# Patient Record
Sex: Male | Born: 1940
Health system: Southern US, Community
[De-identification: ages and names within clinical notes are randomized; demographics above are authoritative.]

## PROBLEM LIST (undated history)

## (undated) DIAGNOSIS — I779 Disorder of arteries and arterioles, unspecified: Secondary | ICD-10-CM

## (undated) DIAGNOSIS — I2510 Atherosclerotic heart disease of native coronary artery without angina pectoris: Secondary | ICD-10-CM

## (undated) DIAGNOSIS — I714 Abdominal aortic aneurysm, without rupture, unspecified: Secondary | ICD-10-CM

## (undated) DIAGNOSIS — Z789 Other specified health status: Secondary | ICD-10-CM

## (undated) DIAGNOSIS — J189 Pneumonia, unspecified organism: Secondary | ICD-10-CM

## (undated) DIAGNOSIS — Z950 Presence of cardiac pacemaker: Secondary | ICD-10-CM

## (undated) DIAGNOSIS — C801 Malignant (primary) neoplasm, unspecified: Secondary | ICD-10-CM

## (undated) DIAGNOSIS — K219 Gastro-esophageal reflux disease without esophagitis: Secondary | ICD-10-CM

## (undated) DIAGNOSIS — M1990 Unspecified osteoarthritis, unspecified site: Secondary | ICD-10-CM

## (undated) DIAGNOSIS — I739 Peripheral vascular disease, unspecified: Secondary | ICD-10-CM

## (undated) DIAGNOSIS — E785 Hyperlipidemia, unspecified: Secondary | ICD-10-CM

## (undated) DIAGNOSIS — I10 Essential (primary) hypertension: Secondary | ICD-10-CM

## (undated) DIAGNOSIS — N183 Chronic kidney disease, stage 3 unspecified: Secondary | ICD-10-CM

## (undated) DIAGNOSIS — E119 Type 2 diabetes mellitus without complications: Secondary | ICD-10-CM

## (undated) DIAGNOSIS — R001 Bradycardia, unspecified: Secondary | ICD-10-CM

## (undated) DIAGNOSIS — I480 Paroxysmal atrial fibrillation: Secondary | ICD-10-CM

## (undated) HISTORY — PX: KNEE SURGERY: SHX244

## (undated) HISTORY — PX: JOINT REPLACEMENT: SHX530

## (undated) HISTORY — PX: URINARY SURGERY: SHX2626

## (undated) HISTORY — PX: FINGER SURGERY: SHX640

## (undated) HISTORY — PX: PROSTATE SURGERY: SHX751

## (undated) HISTORY — DX: Bradycardia, unspecified: R00.1

## (undated) HISTORY — PX: ANKLE SURGERY: SHX546

## (undated) HISTORY — DX: Essential (primary) hypertension: I10

## (undated) HISTORY — DX: Hyperlipidemia, unspecified: E78.5

## (undated) HISTORY — PX: BACK SURGERY: SHX140

## (undated) HISTORY — DX: Chronic kidney disease, stage 3 unspecified: N18.30

## (undated) HISTORY — DX: Type 2 diabetes mellitus without complications: E11.9

## (undated) HISTORY — DX: Gastro-esophageal reflux disease without esophagitis: K21.9

## (undated) HISTORY — DX: Other specified health status: Z78.9

## (undated) HISTORY — PX: EXPLORATION POST OPERATIVE OPEN HEART: SHX5061

## (undated) HISTORY — PX: HERNIA REPAIR: SHX51

## (undated) HISTORY — DX: Peripheral vascular disease, unspecified: I73.9

## (undated) HISTORY — DX: Malignant (primary) neoplasm, unspecified: C80.1

## (undated) HISTORY — DX: Disorder of arteries and arterioles, unspecified: I77.9

## (undated) HISTORY — DX: Chronic kidney disease, stage 3 (moderate): N18.3

## (undated) HISTORY — PX: COLONOSCOPY: SHX174

## (undated) HISTORY — PX: TONSILLECTOMY: SUR1361

---

## 2002-11-30 HISTORY — PX: CORONARY ARTERY BYPASS GRAFT: SHX141

## 2012-09-20 DIAGNOSIS — M1710 Unilateral primary osteoarthritis, unspecified knee: Secondary | ICD-10-CM | POA: Insufficient documentation

## 2012-10-18 DIAGNOSIS — R1032 Left lower quadrant pain: Secondary | ICD-10-CM | POA: Insufficient documentation

## 2012-10-18 DIAGNOSIS — N201 Calculus of ureter: Secondary | ICD-10-CM | POA: Insufficient documentation

## 2012-10-18 DIAGNOSIS — N320 Bladder-neck obstruction: Secondary | ICD-10-CM | POA: Insufficient documentation

## 2012-10-18 DIAGNOSIS — N430 Encysted hydrocele: Secondary | ICD-10-CM | POA: Insufficient documentation

## 2012-10-18 DIAGNOSIS — K4090 Unilateral inguinal hernia, without obstruction or gangrene, not specified as recurrent: Secondary | ICD-10-CM | POA: Insufficient documentation

## 2013-08-24 ENCOUNTER — Ambulatory Visit (INDEPENDENT_AMBULATORY_CARE_PROVIDER_SITE_OTHER): Payer: Medicare Other | Admitting: General Surgery

## 2013-08-24 ENCOUNTER — Encounter (INDEPENDENT_AMBULATORY_CARE_PROVIDER_SITE_OTHER): Payer: Self-pay | Admitting: General Surgery

## 2013-08-24 VITALS — BP 128/76 | HR 60 | Temp 98.0°F | Resp 14 | Ht 74.0 in | Wt 218.2 lb

## 2013-08-24 DIAGNOSIS — L02419 Cutaneous abscess of limb, unspecified: Secondary | ICD-10-CM

## 2013-08-24 MED ORDER — DOXYCYCLINE HYCLATE 100 MG PO TABS: 100.0000 mg | ORAL_TABLET | Freq: Two times a day (BID) | ORAL | Status: DC

## 2013-08-24 MED ORDER — SULFAMETHOXAZOLE-TRIMETHOPRIM 800-160 MG PO TABS: 2.0000 | ORAL_TABLET | Freq: Two times a day (BID) | ORAL | Status: DC

## 2013-08-24 NOTE — Progress Notes (Signed)
Subjective:     Patient ID: Thomas Marshall, male   DOB: [birth year], 72 y.o.   MRN: [id number]  HPI This is a 72 year old male was multiple medical problems and has a two-day history of a left thigh mass that has become tender. There is associated with some redness. He denies any fevers. He denies any drainage. He denies any prior history of a mass in this region. Glucoses have been under fairly good control without any real change. He was referred for evaluation.  Review of Systems  Constitutional: Negative for fever, chills and unexpected weight change.  HENT: Negative for hearing loss, congestion, sore throat, trouble swallowing and voice change.   Eyes: Negative for visual disturbance.  Respiratory: Negative for cough and wheezing.   Cardiovascular: Negative for chest pain, palpitations and leg swelling.  Gastrointestinal: Negative for nausea, vomiting, abdominal pain, diarrhea, constipation, blood in stool, abdominal distention, anal bleeding and rectal pain.  Genitourinary: Negative for hematuria and difficulty urinating.  Musculoskeletal: Negative for arthralgias.  Skin: Negative for rash and wound.  Neurological: Negative for seizures, syncope, weakness and headaches.  Hematological: Negative for adenopathy. Bruises/bleeds easily.  Psychiatric/Behavioral: Negative for confusion.       Objective:   Physical Exam 4x5 cm area of induration with some surrounding erythema near his vein harvest sites, tender, fluctuant, c/w abscess    Assessment:     Left thigh abscess     Plan:     I discussed the options with him. He is on Coumadin and aspirin which complicates this. I think due to his diabetes in its appearance this does need to be incised and drained. I think it is better just to do this in the office as opposed to reversing his Coumadin. I think her bleeding risk is much lower than the risk of anything else. After we discussed all of this I cleansed the area. I then anesthetized  with lidocaine. I then made an incision into the indurated area with return of some bloody fluid, purulence, and some necrotic tissue. This was widely opened. I then packed this tightly with iodoform gauze. A dressing was then placed. I will plan on having him come back tomorrow for a dressing change with my nurse and follow up with me on Monday.

## 2013-08-25 ENCOUNTER — Ambulatory Visit (INDEPENDENT_AMBULATORY_CARE_PROVIDER_SITE_OTHER): Payer: Medicare Other | Admitting: General Surgery

## 2013-08-25 ENCOUNTER — Encounter (INDEPENDENT_AMBULATORY_CARE_PROVIDER_SITE_OTHER): Payer: Self-pay | Admitting: General Surgery

## 2013-08-25 DIAGNOSIS — Z4800 Encounter for change or removal of nonsurgical wound dressing: Secondary | ICD-10-CM

## 2013-08-25 NOTE — Patient Instructions (Signed)
Patient came in today for wound dressing change. I pulled out 1/4 inch iodoform gauze. The wound looked good, I put more 1/4 inch iodoform into the wound and I placed a dry gauze over the wound. I told the patient to keep the iodoform in over the weekend and keep his apt on Monday.

## 2013-08-28 ENCOUNTER — Encounter (INDEPENDENT_AMBULATORY_CARE_PROVIDER_SITE_OTHER): Payer: Self-pay | Admitting: General Surgery

## 2013-08-28 ENCOUNTER — Ambulatory Visit (INDEPENDENT_AMBULATORY_CARE_PROVIDER_SITE_OTHER): Payer: Medicare Other | Admitting: General Surgery

## 2013-08-28 VITALS — BP 168/92 | HR 82 | Temp 98.6°F | Resp 12 | Ht 74.0 in | Wt 218.2 lb

## 2013-08-28 DIAGNOSIS — Z09 Encounter for follow-up examination after completed treatment for conditions other than malignant neoplasm: Secondary | ICD-10-CM

## 2013-08-28 NOTE — Progress Notes (Signed)
Subjective:     Patient ID: Thomas Marshall, male   DOB: Aug 25, 1941, 72 y.o.   MRN: [id number]  HPI 72 yom who I incised and drained a left thigh abscess last week.  His dressing was changed here on Friday. He has had some foul smelling drainage which is now better.  No fevers.  Does not hurt nearly as much.  Review of Systems     Objective:   Physical Exam Left inner thigh with induration, open wound with minimal serous drainage, no more infection    Assessment:     S/p left thigh abscess i and d     Plan:     I repacked this today and told him that he could remove this on Wednesday at home. He can then just let this heal.I gave him some warning signs as well as some long-term things to look for. I will plan on seeing him back as needed.

## 2013-08-28 NOTE — Patient Instructions (Signed)
May remove the packing on Wednesday and then will heal on its own.  If you notice any new redness, fever, pain at the site call me.  Additionally if there is a mass there in next couple months after this resolves please call me also

## 2013-09-01 ENCOUNTER — Encounter: Payer: Self-pay | Admitting: Cardiovascular Disease

## 2013-09-01 ENCOUNTER — Encounter: Payer: Self-pay | Admitting: *Deleted

## 2013-09-01 DIAGNOSIS — K219 Gastro-esophageal reflux disease without esophagitis: Secondary | ICD-10-CM | POA: Insufficient documentation

## 2013-09-01 DIAGNOSIS — E1129 Type 2 diabetes mellitus with other diabetic kidney complication: Secondary | ICD-10-CM | POA: Insufficient documentation

## 2013-09-01 DIAGNOSIS — E1122 Type 2 diabetes mellitus with diabetic chronic kidney disease: Secondary | ICD-10-CM | POA: Insufficient documentation

## 2013-09-01 DIAGNOSIS — E785 Hyperlipidemia, unspecified: Secondary | ICD-10-CM | POA: Insufficient documentation

## 2013-09-01 DIAGNOSIS — Z85828 Personal history of other malignant neoplasm of skin: Secondary | ICD-10-CM | POA: Insufficient documentation

## 2013-09-04 ENCOUNTER — Encounter: Payer: Self-pay | Admitting: Cardiovascular Disease

## 2013-09-04 ENCOUNTER — Ambulatory Visit (INDEPENDENT_AMBULATORY_CARE_PROVIDER_SITE_OTHER): Payer: Medicare Other | Admitting: Pharmacist

## 2013-09-04 ENCOUNTER — Ambulatory Visit (INDEPENDENT_AMBULATORY_CARE_PROVIDER_SITE_OTHER): Payer: Medicare Other | Admitting: Cardiovascular Disease

## 2013-09-04 VITALS — BP 160/70 | HR 72 | Ht 74.0 in | Wt 215.2 lb

## 2013-09-04 DIAGNOSIS — Z7901 Long term (current) use of anticoagulants: Secondary | ICD-10-CM | POA: Insufficient documentation

## 2013-09-04 DIAGNOSIS — I4891 Unspecified atrial fibrillation: Secondary | ICD-10-CM

## 2013-09-04 DIAGNOSIS — I480 Paroxysmal atrial fibrillation: Secondary | ICD-10-CM | POA: Insufficient documentation

## 2013-09-04 LAB — POCT INR: INR: 2

## 2013-09-04 NOTE — Patient Instructions (Addendum)
Your physician recommends that you schedule a follow-up appointment in: 6 weeks. Your physician recommends that you continue on your current medications as directed. Please refer to the Current Medication list given to you today. 

## 2013-09-05 ENCOUNTER — Encounter: Payer: Self-pay | Admitting: Cardiovascular Disease

## 2013-09-05 NOTE — Progress Notes (Signed)
HPI:   72 year-old gentleman presenting to establish care. He has CAD s/p 4 vessel CABG in 2004 in [place], [place]. Other cardiac problems include HTN, hyperlipidemia, and paroxysmal atrial fibrillation treated with a strategy of rhythm control (amiodarone) and anticoagulation (warfarin). He has recently moved back to [place].  The patient complains of symptoms of fatigue, 'giving out,' decreased appetite, 17 pound weight loss, and resting tremor. He denies chest pain or pressure. He has mild dyspnea with exertion. He remains fairly active with yard work and other moderate physical activities.   The patient recently underwent cardiac evaluation for his symptoms of dyspnea and fatigue. This has included a SPECT study showing normal myocardial perfusion without evidence of inducible ischemia, gated LVEF 64%. He is also wearing a 30-day event monitor which was placed in [place]. This was placed to evaluate for AF burden.   He is concerned because he had similar symptoms (albeit more severe) prior to CABG and had a negative stress test before he was found to have severe multivessel CAD.  Outpatient Encounter Prescriptions as of 09/04/2013  Medication Sig Dispense Refill  . amiodarone (PACERONE) 200 MG tablet Take 200 mg by mouth daily.      [name] aspirin 81 MG tablet Take 81 mg by mouth daily.      [name] atorvastatin (LIPITOR) 40 MG tablet Take 40 mg by mouth daily.      . cholecalciferol (VITAMIN D) 1000 UNITS tablet Take 1,000 Units by mouth daily.      . cloNIDine (CATAPRES) 0.2 MG tablet Take 0.2 mg by mouth 2 (two) times daily.      . metFORMIN (GLUMETZA) 1000 MG (MOD) 24 hr tablet Take 1,000 mg by mouth 2 (two) times daily with a meal.       . omeprazole (PRILOSEC) 20 MG capsule Take 20 mg by mouth daily.      [name] spironolactone (ALDACTONE) 25 MG tablet Take 25 mg by mouth daily.      [name] sulfamethoxazole-trimethoprim (BACTRIM DS) 800-160 MG per tablet Take 1 tablet by mouth 2 (two) times daily.       . vitamin C (ASCORBIC ACID) 500 MG tablet Take 500 mg by mouth daily.      [name] warfarin (COUMADIN) 10 MG tablet Take 10 mg by mouth daily.      . [DISCONTINUED] doxycycline (VIBRA-TABS) 100 MG tablet Take 1 tablet (100 mg total) by mouth 2 (two) times daily.  14 tablet  2   No facility-administered encounter medications on file as of 09/04/2013.    Carvedilol; Lisinopril; Losartan; Triamterene; and Ampicillin  Past Medical History  Diagnosis Date  . Cancer     skin & squamous cell  . Diabetes mellitus without complication   . GERD (gastroesophageal reflux disease)   . Hyperlipidemia   . Hypertension     Past Surgical History  Procedure Laterality Date  . Exploration post operative open heart    . Urinary surgery      scar tissue  . Ankle surgery      right fused  . Knee surgery      replacement on both knees  . Back surgery      low back   . Finger surgery      skin graft on rt index    History   Social History  . Marital Status: Married    Spouse Name: N/A    Number of Children: N/A  . Years of Education: N/A   Occupational History  .  Not on file.   Social History Main Topics  . Smoking status: Former Smoker    Quit date: 11/30/1970  . Smokeless tobacco: Never Used  . Alcohol Use: No  . Drug Use: No  . Sexual Activity: Not on file   Other Topics Concern  . Not on file   Social History Narrative  . No narrative on file    Family History  Problem Relation Age of Onset  . Stroke Mother   . Heart disease Father   . Cancer Sister     breast  and skin    ROS:  General: no fevers/chills/night sweats. Positive for weight loss. Eyes: no blurry vision, diplopia, or amaurosis ENT: no sore throat or hearing loss Resp: no cough, wheezing, or hemoptysis CV: no edema or palpitations GI: no abdominal pain, nausea, vomiting, diarrhea, or constipation GU: no dysuria, frequency, or hematuria Skin: no rash Neuro: no headache, numbness, tingling, or weakness of  extremities Musculoskeletal: no joint pain or swelling Heme: no bleeding, DVT, or easy bruising Endo: no polydipsia or polyuria  BP 160/70  Pulse 72  Ht 6\' 2"  (1.88 m)  Wt 215 lb 3.2 oz (97.614 kg)  BMI 27.62 kg/m2  PHYSICAL EXAM: Pt is alert and oriented, WD, WN, in no distress. HEENT: normal Neck: JVP normal. Carotid upstrokes normal without bruits. No thyromegaly. Lungs: equal expansion, clear bilaterally CV: Apex is discrete and nondisplaced, RRR without murmur or gallop Abd: soft, NT, +BS, no bruit, no hepatosplenomegaly Back: no CVA tenderness Ext: no C/C/E        DP/PT pulses intact and = Skin: warm and dry without rash Neuro: CNII-XII intact             Strength intact = bilaterally  EKG:  Sinus brady 55 bpm, otherwise within normal limits  ASSESSMENT AND PLAN: 1. CAD, native vessel, with prior CABG. Pt with nonspecific symptoms and stress Myoview demonstrating normal myocardial perfusion. Would favor medical therapy for now unless symptoms progress. May ultimately need definitive evaluation with cardiac catheterization if no other source of symptoms discovered. Pt on appropriate med Rx with ASA and a statin drug. No beta-blocker because of bradycardia.  2. Atrial fibrillation, paroxysmal. Maintained on amiodarone and warfarin. Will establish him in our coumadin clinic. Continue amiodarone and monitor thyroid studies and LFT's as indicated. He had recent labs in [place] by his report. Review event monitor when available.    3. HTN - BP elevated but first visit here. BP was 133/70 at recent office visit in [place]. Continue to follow. Meds reviewed (clonidine/aldactone).  4. Hyperlipidemia - maintained on high-intensity statin with atorvastatin 40 mg  5. Fatigue/weight loss. He establishes with PCP later this week. I'm concerned there may be something noncardiac going on to explain his symptoms which also include tremor.   For follow-up I will see him back in 4-6  weeks after his event monitor is completed.   [name] 09/05/2013 3:03 PM

## 2013-09-08 ENCOUNTER — Ambulatory Visit (INDEPENDENT_AMBULATORY_CARE_PROVIDER_SITE_OTHER): Payer: Medicare Other | Admitting: Internal Medicine

## 2013-09-08 ENCOUNTER — Encounter: Payer: Self-pay | Admitting: Internal Medicine

## 2013-09-08 VITALS — BP 120/68 | HR 66 | Temp 97.5°F | Resp 16 | Ht 74.0 in | Wt 219.0 lb

## 2013-09-08 DIAGNOSIS — K219 Gastro-esophageal reflux disease without esophagitis: Secondary | ICD-10-CM

## 2013-09-08 DIAGNOSIS — I10 Essential (primary) hypertension: Secondary | ICD-10-CM

## 2013-09-08 DIAGNOSIS — Z23 Encounter for immunization: Secondary | ICD-10-CM

## 2013-09-08 DIAGNOSIS — E119 Type 2 diabetes mellitus without complications: Secondary | ICD-10-CM

## 2013-09-08 DIAGNOSIS — E785 Hyperlipidemia, unspecified: Secondary | ICD-10-CM

## 2013-09-08 NOTE — Patient Instructions (Signed)
Diabetes and Small Vessel Disease  Small vessel disease (microvascular disease) includes nephropathy, retinopathy, and neuropathy. People with diabetes are at risk for these problems, but keeping blood glucose (sugar) controlled is helpful in preventing problems.  DIABETIC KIDNEY PROBLEMS (DIABETIC NEPHROPATHY)  · Diabetic nephropathy occurs in many patients with diabetes.  · Damage to the small vessels in the kidneys is the leading cause of end-stage renal disease (ESRD).  · Protein in the urine (albuminuria) in the range of 30 to 300 mg/24 h (microalbuminuria) is a sign of the earliest stage of diabetic nephropathy.  · Good blood glucose (sugar) and blood pressure control significantly reduce the progression of nephropathy.  DIABETIC EYE PROBLEMS (DIABETIC RETINOPATHY)  · Diabetic retinopathy is the most common cause of new cases of blindness in adults. It is related to the number of years you have had diabetes.  · Common risk factors include high blood sugar (hyperglycemia), high blood pressure (hypertension), and poorly controlled blood lipids such as high blood cholesterol (hypercholesterolemia).  DIABETIC NERVE PROBLEMS (DIABETIC NEUROPATHY)  Diabetic neuropathy is the most common, long-term complication of diabetes. It is responsible for more than half of leg amputations not due to accidents. The main risk for developing diabetic neuropathy seems to be uncontrolled blood sugars. Hyperglycemia damages the nerve fibers causing sensation (feeling) problems.  The closer you can keep the following guidelines, the better chance you will have avoiding problems from small vessel disease.  · Working toward near normal blood glucose or as normal as possible. You will need to keep your blood glucose and A1c at the target range prescribed by your caregiver.  · Keep your blood pressure less than 120/80.  · Keep your low-density lipoprotein (LDL) cholesterol (one of the fats in your blood) at less than 100 mg/dL. An LDL  less than 70 mg/dL may be recommended for high risk patients.  You cannot change your family history, but it is important to change the risk factors that you can. Risk factors you can control include:  · Controlling high blood pressure.  · Stopping smoking.  · Using alcohol only in moderation. Generally, this means about one drink per day for women and two drinks per day for men.  · Controlling your blood lipids (cholesterol and triglycerides).  · Treating heart problems, if these are contributing to risk.  SEEK MEDICAL CARE IF:   · You are having problems keeping your blood glucose in goal range.  · You notice a change in your vision or new problems with your vision.  · You have wound or sore that does not heal.  · Your blood pressure is above the target range.  Document Released: 11/19/2003 Document Revised: 11/02/2012 Document Reviewed: 04/26/2009  [employer]® Patient Information ©2014 [employer].

## 2013-09-08 NOTE — Assessment & Plan Note (Signed)
Well controlled Continue to follow with Dr. Burt Knack

## 2013-09-08 NOTE — Progress Notes (Signed)
Pt presents to the clinic today to establish care. [place] He is transferring care from Dr. Salvadore Farber. He has already established care with Dr. Burt Knack who is currently evaluating him for fatigue and possible afib with a heart monitor. He has no concerns today.  Flu: yearly- needs one today Tetanus: unsure of date Pneumonia: unsure of date Shingles: never PSA: 2014 Colonoscopy: 2008  Eye doctor: yearly Dentist: no-dentures    Past Medical History  Diagnosis Date  . Cancer     skin & squamous cell  . Diabetes mellitus without complication   . GERD (gastroesophageal reflux disease)   . Hyperlipidemia   . Hypertension     Current Outpatient Prescriptions  Medication Sig Dispense Refill  . amiodarone (PACERONE) 200 MG tablet Take 200 mg by mouth daily.      [name] aspirin 81 MG tablet Take 81 mg by mouth daily.      [name] atorvastatin (LIPITOR) 40 MG tablet Take 40 mg by mouth daily.      . cholecalciferol (VITAMIN D) 1000 UNITS tablet Take 1,000 Units by mouth daily.      . cloNIDine (CATAPRES) 0.2 MG tablet Take 0.2 mg by mouth 2 (two) times daily.      . metFORMIN (GLUMETZA) 1000 MG (MOD) 24 hr tablet Take 1,000 mg by mouth 2 (two) times daily with a meal.       . omeprazole (PRILOSEC) 20 MG capsule Take 20 mg by mouth daily.      [name] spironolactone (ALDACTONE) 25 MG tablet Take 25 mg by mouth daily.      [name] sulfamethoxazole-trimethoprim (BACTRIM DS) 800-160 MG per tablet Take 1 tablet by mouth 2 (two) times daily.      . vitamin C (ASCORBIC ACID) 500 MG tablet Take 500 mg by mouth daily.      [name] warfarin (COUMADIN) 10 MG tablet Take 10 mg by mouth daily.       No current facility-administered medications for this visit.    Allergies  Allergen Reactions  . Carvedilol     Low heart rate   . Lisinopril Swelling  . Losartan Other (See Comments)    Increased sugar  . Triamterene Swelling  . Ampicillin Rash    Family History  Problem Relation Age of  Onset  . Stroke Mother   . Heart disease Father   . Cancer Sister     breast  and skin  . Diabetes Sister   . Diabetes Brother   . Early death Neg Hx     History   Social History  . Marital Status: Married    Spouse Name: N/A    Number of Children: N/A  . Years of Education: N/A   Occupational History  . Not on file.   Social History Main Topics  . Smoking status: Former Smoker    Quit date: 11/30/1970  . Smokeless tobacco: Never Used  . Alcohol Use: No  . Drug Use: No  . Sexual Activity: Not Currently   Other Topics Concern  . Not on file   Social History Narrative  . No narrative on file     Constitutional: Denies fever, malaise, fatigue, headache or abrupt weight changes.  HEENT: Denies eye pain, eye redness, ear pain, ringing in the ears, wax buildup, runny nose, nasal congestion, bloody nose, or sore throat. Respiratory: Denies difficulty breathing, shortness of breath, cough or sputum production.   Cardiovascular: Denies chest pain, chest tightness, palpitations or  swelling in the hands or feet.  Gastrointestinal: Denies abdominal pain, bloating, constipation, diarrhea or blood in the stool.  GU: Denies urgency, frequency, pain with urination, burning sensation, blood in urine, odor or discharge. Musculoskeletal: Denies decrease in range of motion, difficulty with gait, muscle pain or joint pain and swelling.  Skin: Denies redness, rashes, lesions or ulcercations.  Neurological: Denies dizziness, difficulty with memory, difficulty with speech or problems with balance and coordination.   No other specific complaints in a complete review of systems (except as listed in HPI above). BP 120/68  Pulse 66  Temp(Src) 97.5 F (36.4 C) (Oral)  Resp 16  Ht 6\' 2"  (1.88 m)  Wt 219 lb (99.338 kg)  BMI 28.11 kg/m2  SpO2 98% Wt Readings from Last 3 Encounters:  09/08/13 219 lb (99.338 kg)  09/04/13 215 lb 3.2 oz (97.614 kg)  08/28/13 218 lb 3.2 oz (98.975 kg)     General: Appears his stated age, well developed, well nourished in NAD. Skin: Warm, dry and intact. No rashes, lesions or ulcerations noted. HEENT: Head: normal shape and size; Eyes: sclera white, no icterus, conjunctiva pink, PERRLA and EOMs intact; Ears: Tm's gray and intact, normal light reflex; Nose: mucosa pink and moist, septum midline; Throat/Mouth: Teeth present, mucosa pink and moist, no exudate, lesions or ulcerations noted.  Neck: Normal range of motion. Neck supple, trachea midline. No massses, lumps or thyromegaly present.  Cardiovascular: Normal rate and rhythm. S1,S2 noted.  No murmur, rubs or gallops noted. No JVD or BLE edema. No carotid bruits noted. Pulmonary/Chest: Normal effort and positive vesicular breath sounds. No respiratory distress. No wheezes, rales or ronchi noted.  Abdomen: Soft and nontender. Normal bowel sounds, no bruits noted. No distention or masses noted. Liver, spleen and kidneys non palpable. Musculoskeletal: Normal range of motion. No signs of joint swelling. No difficulty with gait.  Neurological: Alert and oriented. Cranial nerves II-XII intact. Coordination normal. +DTRs bilaterally. Psychiatric: Mood and affect normal. Behavior is normal. Judgment and thought content normal.    Assessment/Plan:  Health Maintenance:  Will obtain labs from Dr. Linna Darner Flu shot today Will check immunization status from Dr. Salvadore Farber  RTC in 6 months or sooner if needed

## 2013-09-08 NOTE — Assessment & Plan Note (Signed)
Pt does not test sugars Had A1C in September- will obtain copies Continue current med for now

## 2013-09-08 NOTE — Assessment & Plan Note (Signed)
Well controlled on prilosec.

## 2013-09-08 NOTE — Assessment & Plan Note (Signed)
Will obtain labs from September Continue current therapy for now

## 2013-09-11 ENCOUNTER — Ambulatory Visit (INDEPENDENT_AMBULATORY_CARE_PROVIDER_SITE_OTHER): Payer: Medicare Other | Admitting: *Deleted

## 2013-09-11 DIAGNOSIS — Z7901 Long term (current) use of anticoagulants: Secondary | ICD-10-CM

## 2013-09-11 DIAGNOSIS — I4891 Unspecified atrial fibrillation: Secondary | ICD-10-CM

## 2013-09-11 LAB — POCT INR: INR: 1.3

## 2013-09-11 MED ORDER — WARFARIN SODIUM 5 MG PO TABS: ORAL_TABLET | ORAL | Status: DC

## 2013-09-15 ENCOUNTER — Encounter: Payer: Self-pay | Admitting: Cardiovascular Disease

## 2013-09-18 ENCOUNTER — Ambulatory Visit (INDEPENDENT_AMBULATORY_CARE_PROVIDER_SITE_OTHER): Payer: Medicare Other | Admitting: General Practice

## 2013-09-18 DIAGNOSIS — I4891 Unspecified atrial fibrillation: Secondary | ICD-10-CM

## 2013-09-18 DIAGNOSIS — Z7901 Long term (current) use of anticoagulants: Secondary | ICD-10-CM

## 2013-09-18 LAB — POCT INR: INR: 1.4

## 2013-09-18 MED ORDER — SPIRONOLACTONE 25 MG PO TABS: 25.0000 mg | ORAL_TABLET | Freq: Every day | ORAL | Status: DC

## 2013-09-20 ENCOUNTER — Ambulatory Visit: Payer: Self-pay | Admitting: Cardiovascular Disease

## 2013-09-25 ENCOUNTER — Ambulatory Visit (INDEPENDENT_AMBULATORY_CARE_PROVIDER_SITE_OTHER): Payer: Medicare Other | Admitting: Pharmacist

## 2013-09-25 DIAGNOSIS — I4891 Unspecified atrial fibrillation: Secondary | ICD-10-CM

## 2013-09-25 DIAGNOSIS — Z7901 Long term (current) use of anticoagulants: Secondary | ICD-10-CM

## 2013-09-25 LAB — POCT INR: INR: 2

## 2013-10-03 ENCOUNTER — Ambulatory Visit (INDEPENDENT_AMBULATORY_CARE_PROVIDER_SITE_OTHER): Payer: Medicare Other | Admitting: Pharmacist

## 2013-10-03 DIAGNOSIS — I4891 Unspecified atrial fibrillation: Secondary | ICD-10-CM

## 2013-10-03 DIAGNOSIS — Z7901 Long term (current) use of anticoagulants: Secondary | ICD-10-CM

## 2013-10-03 LAB — POCT INR: INR: 2.6

## 2013-10-17 ENCOUNTER — Ambulatory Visit (INDEPENDENT_AMBULATORY_CARE_PROVIDER_SITE_OTHER): Payer: Medicare Other | Admitting: General Practice

## 2013-10-17 DIAGNOSIS — I4891 Unspecified atrial fibrillation: Secondary | ICD-10-CM

## 2013-10-17 DIAGNOSIS — Z7901 Long term (current) use of anticoagulants: Secondary | ICD-10-CM

## 2013-10-17 LAB — POCT INR: INR: 3.8

## 2013-10-25 ENCOUNTER — Encounter: Payer: Self-pay | Admitting: Cardiovascular Disease

## 2013-10-25 ENCOUNTER — Ambulatory Visit (INDEPENDENT_AMBULATORY_CARE_PROVIDER_SITE_OTHER): Payer: Medicare Other | Admitting: Cardiovascular Disease

## 2013-10-25 VITALS — BP 140/80 | HR 56 | Ht 74.0 in | Wt 222.0 lb

## 2013-10-25 DIAGNOSIS — E7800 Pure hypercholesterolemia, unspecified: Secondary | ICD-10-CM

## 2013-10-25 DIAGNOSIS — I25810 Atherosclerosis of coronary artery bypass graft(s) without angina pectoris: Secondary | ICD-10-CM

## 2013-10-25 NOTE — Patient Instructions (Signed)
Your physician wants you to follow-up in: 6 MONTHS with Dr Burt Knack.  You will receive a reminder letter in the mail two months in advance. If you don't receive a letter, please call our office to schedule the follow-up appointment.  Your physician recommends that you return for a FASTING LIPID, LIVER and BMP in 6 MONTHS--nothing to eat or drink after midnight, lab opens at 7:30 AM (please do labs one week prior to 6 month appointment)  Your physician recommends that you continue on your current medications as directed. Please refer to the Current Medication list given to you today.

## 2013-10-25 NOTE — Progress Notes (Addendum)
HPI:  72 year old gentleman presenting for followup evaluation. I initially saw him 09/04/2013. The patient has coronary artery disease status post four-vessel CABG in 2004. Other chronic Cardiac problems include hypertension, hyperlipidemia, and paroxysmal atrial fibrillation treated with amiodarone and anticoagulated with warfarin. The patient wore an event monitor demonstrating sinus bradycardia with PVCs. He's had a Myoview scan demonstrating no significant ischemia.  He's done well since I last saw him. He denies chest pain or pressure, dyspnea, or recurrence of palpitations. He has discontinued amiodarone. He was experiencing no ill effects of the medication, but decided not to have it refilled. He denies any bleeding problems on combination of warfarin and low-dose aspirin. He continues to have generalized weakness with physical activity, but he has not been engaged in any routine exercise.  Outpatient Encounter Prescriptions as of 10/25/2013  Medication Sig  . amiodarone (PACERONE) 200 MG tablet Take 200 mg by mouth daily.  [name] aspirin 81 MG tablet Take 81 mg by mouth daily.  [name] atorvastatin (LIPITOR) 40 MG tablet Take 40 mg by mouth daily.  . cholecalciferol (VITAMIN D) 1000 UNITS tablet Take 1,000 Units by mouth daily.  . cloNIDine (CATAPRES) 0.2 MG tablet Take 0.2 mg by mouth 2 (two) times daily.  . metFORMIN (GLUMETZA) 1000 MG (MOD) 24 hr tablet Take 1,000 mg by mouth 2 (two) times daily with a meal.   . omeprazole (PRILOSEC) 20 MG capsule Take 20 mg by mouth daily.  [name] spironolactone (ALDACTONE) 25 MG tablet Take 1 tablet (25 mg total) by mouth daily.  . vitamin C (ASCORBIC ACID) 500 MG tablet Take 500 mg by mouth daily.  [name] warfarin (COUMADIN) 5 MG tablet Take as directed by coumadin clinic    Allergies  Allergen Reactions  . Carvedilol     Low heart rate   . Lisinopril Swelling  . Losartan Other (See Comments)    Increased sugar  . Triamterene Swelling  . Ampicillin Rash     Past Medical History  Diagnosis Date  . Cancer     skin & squamous cell  . Diabetes mellitus without complication   . GERD (gastroesophageal reflux disease)   . Hyperlipidemia   . Hypertension     ROS: Negative except as per HPI  BP 140/80  Pulse 56  Ht 6\' 2"  (1.88 m)  Wt 222 lb (100.699 kg)  BMI 28.49 kg/m2  PHYSICAL EXAM: Pt is alert and oriented, NAD HEENT: normal Neck: JVP - normal, carotids 2+= without bruits Lungs: CTA bilaterally CV: RRR without murmur or gallop Abd: soft, NT, Positive BS, no hepatomegaly Ext: no C/C/E, distal pulses intact and equal Skin: warm/dry no rash  ASSESSMENT AND PLAN: 1. Coronary artery disease, native vessel. The patient is status post CABG. He has had a stress nuclear scan demonstrating no evidence of ischemia. He will continue on his current medical management as he has no symptoms of angina.  2. Paroxysmal atrial fibrillation. He will be maintained on anticoagulation with warfarin. I think it is reasonable to keep him off of amiodarone unless he has a recurrence of symptomatic atrial fibrillation.  3. Essential hypertension. Blood pressure is controlled on clonidine and Aldactone.  4. Hyperlipidemia. He continues on atorvastatin. He will have followup lipids and LFTs when he returns in 6 months.  [name] 10/25/2013 6:19 PM  ADDENDUM 12/28: Pt for noncardiac surgery in January. He's had low-risk nuclear scan and no angina. Followed for PAF and has been in sinus. OK to hold warfarin 5  days prior to surgery and proceed without further testing.  [name] 11/26/2013 7:41 AM

## 2013-10-30 ENCOUNTER — Ambulatory Visit (INDEPENDENT_AMBULATORY_CARE_PROVIDER_SITE_OTHER): Payer: Medicare Other | Admitting: *Deleted

## 2013-10-30 ENCOUNTER — Other Ambulatory Visit: Payer: Self-pay | Admitting: *Deleted

## 2013-10-30 DIAGNOSIS — Z7901 Long term (current) use of anticoagulants: Secondary | ICD-10-CM

## 2013-10-30 DIAGNOSIS — I4891 Unspecified atrial fibrillation: Secondary | ICD-10-CM

## 2013-10-30 MED ORDER — WARFARIN SODIUM 10 MG PO TABS: 10.0000 mg | ORAL_TABLET | ORAL | Status: DC

## 2013-11-09 ENCOUNTER — Telehealth (INDEPENDENT_AMBULATORY_CARE_PROVIDER_SITE_OTHER): Payer: Self-pay

## 2013-11-09 NOTE — Telephone Encounter (Signed)
Pts daughter called stating pt was seen out of town for [employer] attack. Pt is better but wants to see Dr Donne Hazel asap. Schedule reviewed with Alisah.  Pt is on coumadin and an aneurysm was seen on Ct also. Pt has appt 11-16-13 with Dr Donne Hazel. I advised her that the aneurysm will need to be worked up by vascular surgeon and that pts coumadin will need to be managed by Cardiology or PCP that follows coumadin. I also advised her that pt may require cardiac clearance and clearance to stop coumadin with or without blood thinner bridge. She states she understands this. She will keep pt on low fat diet to hopefully avoid another attack.  She was advised if symptoms return pt should go to [facility] if prior to appointment with Dr Donne Hazel. She states she understands. She will call back with concerns.

## 2013-11-16 ENCOUNTER — Ambulatory Visit (INDEPENDENT_AMBULATORY_CARE_PROVIDER_SITE_OTHER): Payer: Medicare Other | Admitting: General Surgery

## 2013-11-16 ENCOUNTER — Other Ambulatory Visit: Payer: Self-pay | Admitting: *Deleted

## 2013-11-16 ENCOUNTER — Other Ambulatory Visit (INDEPENDENT_AMBULATORY_CARE_PROVIDER_SITE_OTHER): Payer: Self-pay | Admitting: General Surgery

## 2013-11-16 ENCOUNTER — Encounter (INDEPENDENT_AMBULATORY_CARE_PROVIDER_SITE_OTHER): Payer: Self-pay | Admitting: General Surgery

## 2013-11-16 ENCOUNTER — Ambulatory Visit (INDEPENDENT_AMBULATORY_CARE_PROVIDER_SITE_OTHER): Payer: Medicare Other | Admitting: *Deleted

## 2013-11-16 ENCOUNTER — Encounter (INDEPENDENT_AMBULATORY_CARE_PROVIDER_SITE_OTHER): Payer: Self-pay

## 2013-11-16 VITALS — BP 150/84 | HR 52 | Temp 98.2°F | Resp 14 | Ht 74.0 in | Wt 223.6 lb

## 2013-11-16 DIAGNOSIS — Z7901 Long term (current) use of anticoagulants: Secondary | ICD-10-CM

## 2013-11-16 DIAGNOSIS — K8020 Calculus of gallbladder without cholecystitis without obstruction: Secondary | ICD-10-CM

## 2013-11-16 DIAGNOSIS — I4891 Unspecified atrial fibrillation: Secondary | ICD-10-CM

## 2013-11-16 MED ORDER — CLONIDINE HCL 0.2 MG PO TABS: 0.2000 mg | ORAL_TABLET | Freq: Two times a day (BID) | ORAL | Status: DC

## 2013-11-16 NOTE — Progress Notes (Signed)
Patient ID: Thomas Marshall, male   DOB: 10-Jan-1941, 72 y.o.   MRN: [id number]  Chief Complaint  Patient presents with  . New Evaluation    eval gallstones    HPI Thomas Marshall is a 72 y.o. male.   HPI This is a 72 year old male who I know from a thigh abscess before. He has a history of coronary disease status post a bypass. He has atrial fibrillation for which he is on Coumadin and aspirin. He was doing well until last week when he was at the beach. He had severe right upper quadrant pain that woke him up tonight. He went to the emergency room there. I have the CT scans on a disc as well as the reports are reviewed both of these today. He has had no pain since then but he change his diet. He has occasional what he calls a twinge. No prior history. His bowel movements were loose around the episode. He did have some nausea and vomiting around this as well. He comes in today after he was diagnosed with cholelithiasis to discuss a possible cholecystectomy. Past Medical History  Diagnosis Date  . Cancer     skin & squamous cell  . Diabetes mellitus without complication   . GERD (gastroesophageal reflux disease)   . Hyperlipidemia   . Hypertension     Past Surgical History  Procedure Laterality Date  . Exploration post operative open heart    . Urinary surgery      scar tissue  . Ankle surgery      right fused  . Knee surgery      replacement on both knees  . Back surgery      low back   . Finger surgery      skin graft on rt index    Family History  Problem Relation Age of Onset  . Stroke Mother   . Heart disease Father   . Cancer Sister     breast  and skin  . Diabetes Sister   . Diabetes Brother   . Early death Neg Hx     Social History History  Substance Use Topics  . Smoking status: Former Smoker    Quit date: 11/30/1970  . Smokeless tobacco: Never Used  . Alcohol Use: No    Allergies  Allergen Reactions  . Carvedilol     Low heart rate   . Lisinopril Swelling   . Losartan Other (See Comments)    Increased sugar  . Triamterene Swelling  . Ampicillin Rash    Current Outpatient Prescriptions  Medication Sig Dispense Refill  . aspirin 81 MG tablet Take 81 mg by mouth daily.      [name] atorvastatin (LIPITOR) 40 MG tablet Take 40 mg by mouth daily.      . cholecalciferol (VITAMIN D) 1000 UNITS tablet Take 1,000 Units by mouth daily.      . cloNIDine (CATAPRES) 0.2 MG tablet Take 0.2 mg by mouth 2 (two) times daily.      . metFORMIN (GLUMETZA) 1000 MG (MOD) 24 hr tablet Take 1,000 mg by mouth 2 (two) times daily with a meal.       . omeprazole (PRILOSEC) 20 MG capsule Take 20 mg by mouth daily.      [name] spironolactone (ALDACTONE) 25 MG tablet Take 1 tablet (25 mg total) by mouth daily.  90 tablet  3  . vitamin C (ASCORBIC ACID) 500 MG tablet Take 500 mg by mouth daily.      [name]  warfarin (COUMADIN) 10 MG tablet Take 1 tablet (10 mg total) by mouth as directed.  90 tablet  0   No current facility-administered medications for this visit.    Review of Systems Review of Systems  Constitutional: Negative for fever, chills and unexpected weight change.  HENT: Negative for congestion, hearing loss, sore throat, trouble swallowing and voice change.   Eyes: Negative for visual disturbance.  Respiratory: Negative for cough and wheezing.   Cardiovascular: Negative for chest pain, palpitations and leg swelling.  Gastrointestinal: Positive for nausea, vomiting, abdominal pain and diarrhea. Negative for constipation, blood in stool, abdominal distention, anal bleeding and rectal pain.  Genitourinary: Negative for hematuria and difficulty urinating.  Musculoskeletal: Negative for arthralgias.  Skin: Negative for rash and wound.  Neurological: Negative for seizures, syncope, weakness and headaches.  Hematological: Negative for adenopathy. Does not bruise/bleed easily.  Psychiatric/Behavioral: Negative for confusion.    Blood pressure 150/84, pulse 52, temperature  98.2 F (36.8 C), temperature source Temporal, resp. rate 14, height 6\' 2"  (1.88 m), weight 223 lb 9.6 oz (101.424 kg).  Physical Exam Physical Exam  Vitals reviewed. Constitutional: He appears well-developed and well-nourished.  Eyes: No scleral icterus.  Cardiovascular: Normal rate, regular rhythm and normal heart sounds.   Pulmonary/Chest: Effort normal and breath sounds normal. He has no wheezes. He has no rales.  Abdominal: Soft. Bowel sounds are normal. There is no tenderness.  Lymphadenopathy:    He has no cervical adenopathy.    Data Reviewed [place] and ct scan from outside hospital  Assessment    Symptomatic cholelithiasis    Plan    Lap chole with possible cholangiogram Will discuss with Dr Burt Knack for periop risk assessment and coumadin mgt Will need aneurysm followed. I discussed the procedure in detail.  The patient was given Neurosurgeon.  We discussed the risks and benefits of a laparoscopic cholecystectomy and possible cholangiogram including, but not limited to bleeding, infection, injury to surrounding structures such as the intestine or liver, bile leak, retained gallstones, need to convert to an open procedure, prolonged diarrhea, blood clots such as  DVT, common bile duct injury, anesthesia risks, and possible need for additional procedures.  The likelihood of improvement in symptoms and return to the patient's normal status is good. We discussed the typical post-operative recovery course.         Adebayo Ensminger 11/16/2013, 9:28 AM

## 2013-11-20 ENCOUNTER — Encounter (INDEPENDENT_AMBULATORY_CARE_PROVIDER_SITE_OTHER): Payer: Self-pay

## 2013-11-20 ENCOUNTER — Telehealth (INDEPENDENT_AMBULATORY_CARE_PROVIDER_SITE_OTHER): Payer: Self-pay

## 2013-11-20 NOTE — Telephone Encounter (Signed)
Called pt to notify him that we did receive cardiac clearance by Dr Burt Knack and ok for pt to stop the Coumadin 5 days before surgery. The surgical orders will be turned into surgery scheduling today. The pt understands.

## 2013-11-22 ENCOUNTER — Telehealth (INDEPENDENT_AMBULATORY_CARE_PROVIDER_SITE_OTHER): Payer: Self-pay | Admitting: General Surgery

## 2013-11-22 NOTE — Telephone Encounter (Signed)
Called patient to schedule surgery, left vm @ 908am 11/22/13 face sheet placed in pending.

## 2013-11-28 ENCOUNTER — Encounter (HOSPITAL_COMMUNITY): Payer: Self-pay | Admitting: Pharmacist

## 2013-12-04 ENCOUNTER — Ambulatory Visit (HOSPITAL_COMMUNITY)
Admission: RE | Admit: 2013-12-04 | Discharge: 2013-12-04 | Disposition: A | Discharge status: Home / Self Care | Payer: Medicare Other | Source: Ambulatory Visit | Attending: Anesthesiology | Admitting: Anesthesiology

## 2013-12-04 ENCOUNTER — Encounter (HOSPITAL_COMMUNITY)
Admission: RE | Admit: 2013-12-04 | Discharge: 2013-12-04 | Disposition: A | Discharge status: Home / Self Care | Payer: Medicare Other | Source: Ambulatory Visit | Attending: General Surgery | Admitting: General Surgery

## 2013-12-04 ENCOUNTER — Encounter (HOSPITAL_COMMUNITY): Payer: Self-pay

## 2013-12-04 DIAGNOSIS — E785 Hyperlipidemia, unspecified: Secondary | ICD-10-CM | POA: Insufficient documentation

## 2013-12-04 DIAGNOSIS — Z87891 Personal history of nicotine dependence: Secondary | ICD-10-CM | POA: Diagnosis not present

## 2013-12-04 DIAGNOSIS — Z01818 Encounter for other preprocedural examination: Secondary | ICD-10-CM | POA: Insufficient documentation

## 2013-12-04 DIAGNOSIS — I10 Essential (primary) hypertension: Secondary | ICD-10-CM | POA: Diagnosis not present

## 2013-12-04 DIAGNOSIS — E119 Type 2 diabetes mellitus without complications: Secondary | ICD-10-CM | POA: Diagnosis not present

## 2013-12-04 DIAGNOSIS — K219 Gastro-esophageal reflux disease without esophagitis: Secondary | ICD-10-CM | POA: Insufficient documentation

## 2013-12-04 DIAGNOSIS — I2510 Atherosclerotic heart disease of native coronary artery without angina pectoris: Secondary | ICD-10-CM | POA: Insufficient documentation

## 2013-12-04 DIAGNOSIS — Z951 Presence of aortocoronary bypass graft: Secondary | ICD-10-CM | POA: Diagnosis not present

## 2013-12-04 DIAGNOSIS — I4891 Unspecified atrial fibrillation: Secondary | ICD-10-CM | POA: Diagnosis not present

## 2013-12-04 HISTORY — DX: Paroxysmal atrial fibrillation: I48.0

## 2013-12-04 HISTORY — DX: Atherosclerotic heart disease of native coronary artery without angina pectoris: I25.10

## 2013-12-04 LAB — COMPREHENSIVE METABOLIC PANEL
ALK PHOS: 69 U/L (ref 39–117)
ALT: 18 U/L (ref 0–53)
AST: 18 U/L (ref 0–37)
Albumin: 3.7 g/dL (ref 3.5–5.2)
BUN: 26 mg/dL — AB (ref 6–23)
CO2: 28 mEq/L (ref 19–32)
CREATININE: 1.48 mg/dL — AB (ref 0.50–1.35)
Calcium: 8.8 mg/dL (ref 8.4–10.5)
Chloride: 103 mEq/L (ref 96–112)
GFR calc non Af Amer: 45 mL/min — ABNORMAL LOW (ref >90)
GFR, EST AFRICAN AMERICAN: 52 mL/min — AB (ref >90)
Glucose, Bld: 111 mg/dL — ABNORMAL HIGH (ref 70–99)
POTASSIUM: 4.7 meq/L (ref 3.7–5.3)
Sodium: 141 mEq/L (ref 137–147)
TOTAL PROTEIN: 6.7 g/dL (ref 6.0–8.3)
Total Bilirubin: 0.7 mg/dL (ref 0.3–1.2)

## 2013-12-04 LAB — CBC WITH DIFFERENTIAL/PLATELET
Basophils Absolute: 0 10*3/uL (ref 0.0–0.1)
Basophils Relative: 1 % (ref 0–1)
EOS ABS: 0.4 10*3/uL (ref 0.0–0.7)
Eosinophils Relative: 6 % — ABNORMAL HIGH (ref 0–5)
HCT: 34.5 % — ABNORMAL LOW (ref 39.0–52.0)
HEMOGLOBIN: 11.3 g/dL — AB (ref 13.0–17.0)
Lymphocytes Relative: 27 % (ref 12–46)
Lymphs Abs: 1.6 10*3/uL (ref 0.7–4.0)
MCH: 31 pg (ref 26.0–34.0)
MCHC: 32.8 g/dL (ref 30.0–36.0)
MCV: 94.5 fL (ref 78.0–100.0)
MONOS PCT: 11 % (ref 3–12)
Monocytes Absolute: 0.7 10*3/uL (ref 0.1–1.0)
NEUTROS PCT: 56 % (ref 43–77)
Neutro Abs: 3.4 10*3/uL (ref 1.7–7.7)
Platelets: 125 10*3/uL — ABNORMAL LOW (ref 150–400)
RBC: 3.65 MIL/uL — AB (ref 4.22–5.81)
RDW: 13.6 % (ref 11.5–15.5)
WBC: 6.1 10*3/uL (ref 4.0–10.5)

## 2013-12-04 LAB — PROTIME-INR
INR: 1.43 (ref 0.00–1.49)
PROTHROMBIN TIME: 17.1 s — AB (ref 11.6–15.2)

## 2013-12-04 LAB — APTT: aPTT: 31 seconds (ref 24–37)

## 2013-12-04 IMAGING — CR DG CHEST 2V
2 series · 2 of 2 positions shown · non-contrast
Comparison: None.

CLINICAL DATA: Preop gallbladder surgery.

EXAM:
CHEST  2 VIEW

[w chest pa]
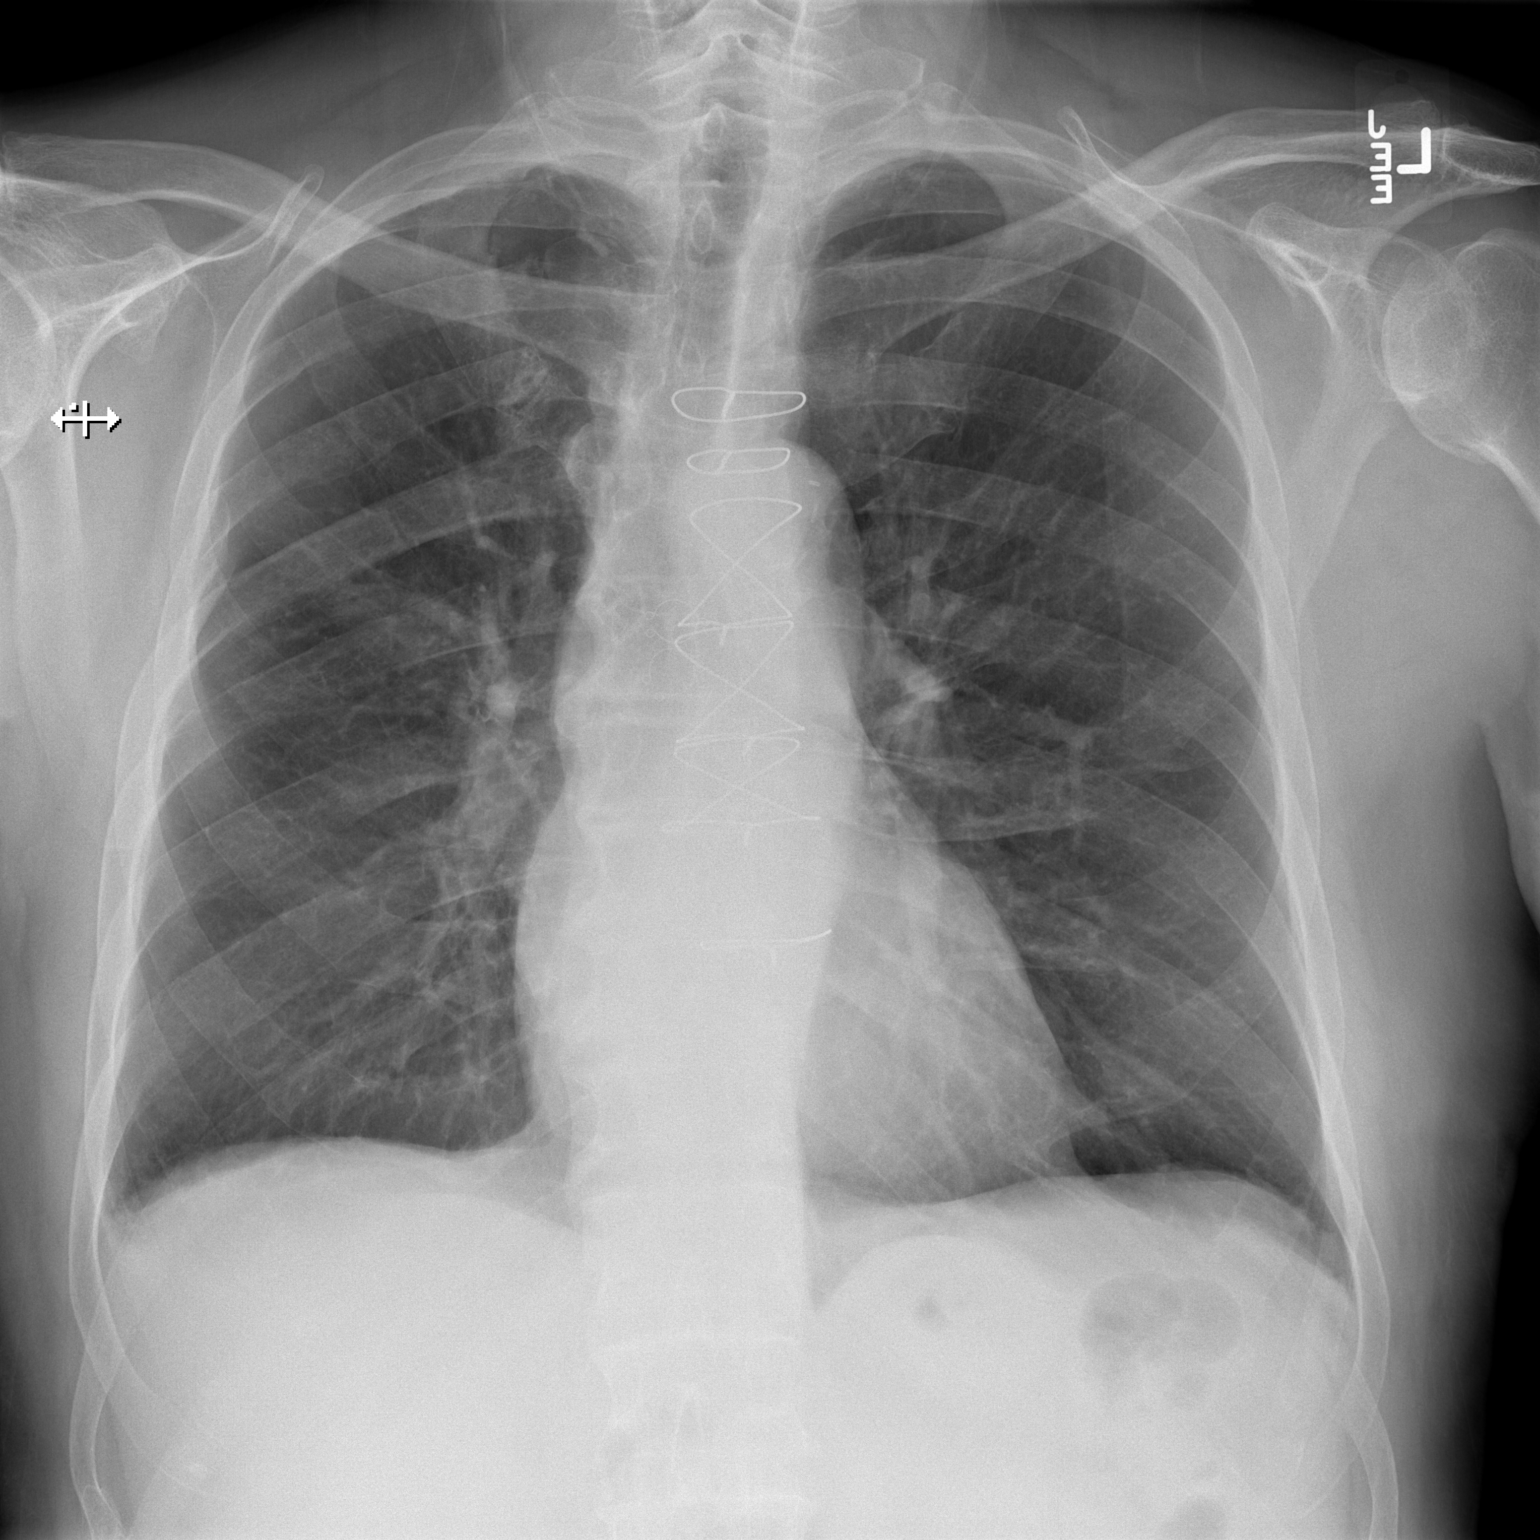

[w chest lat]
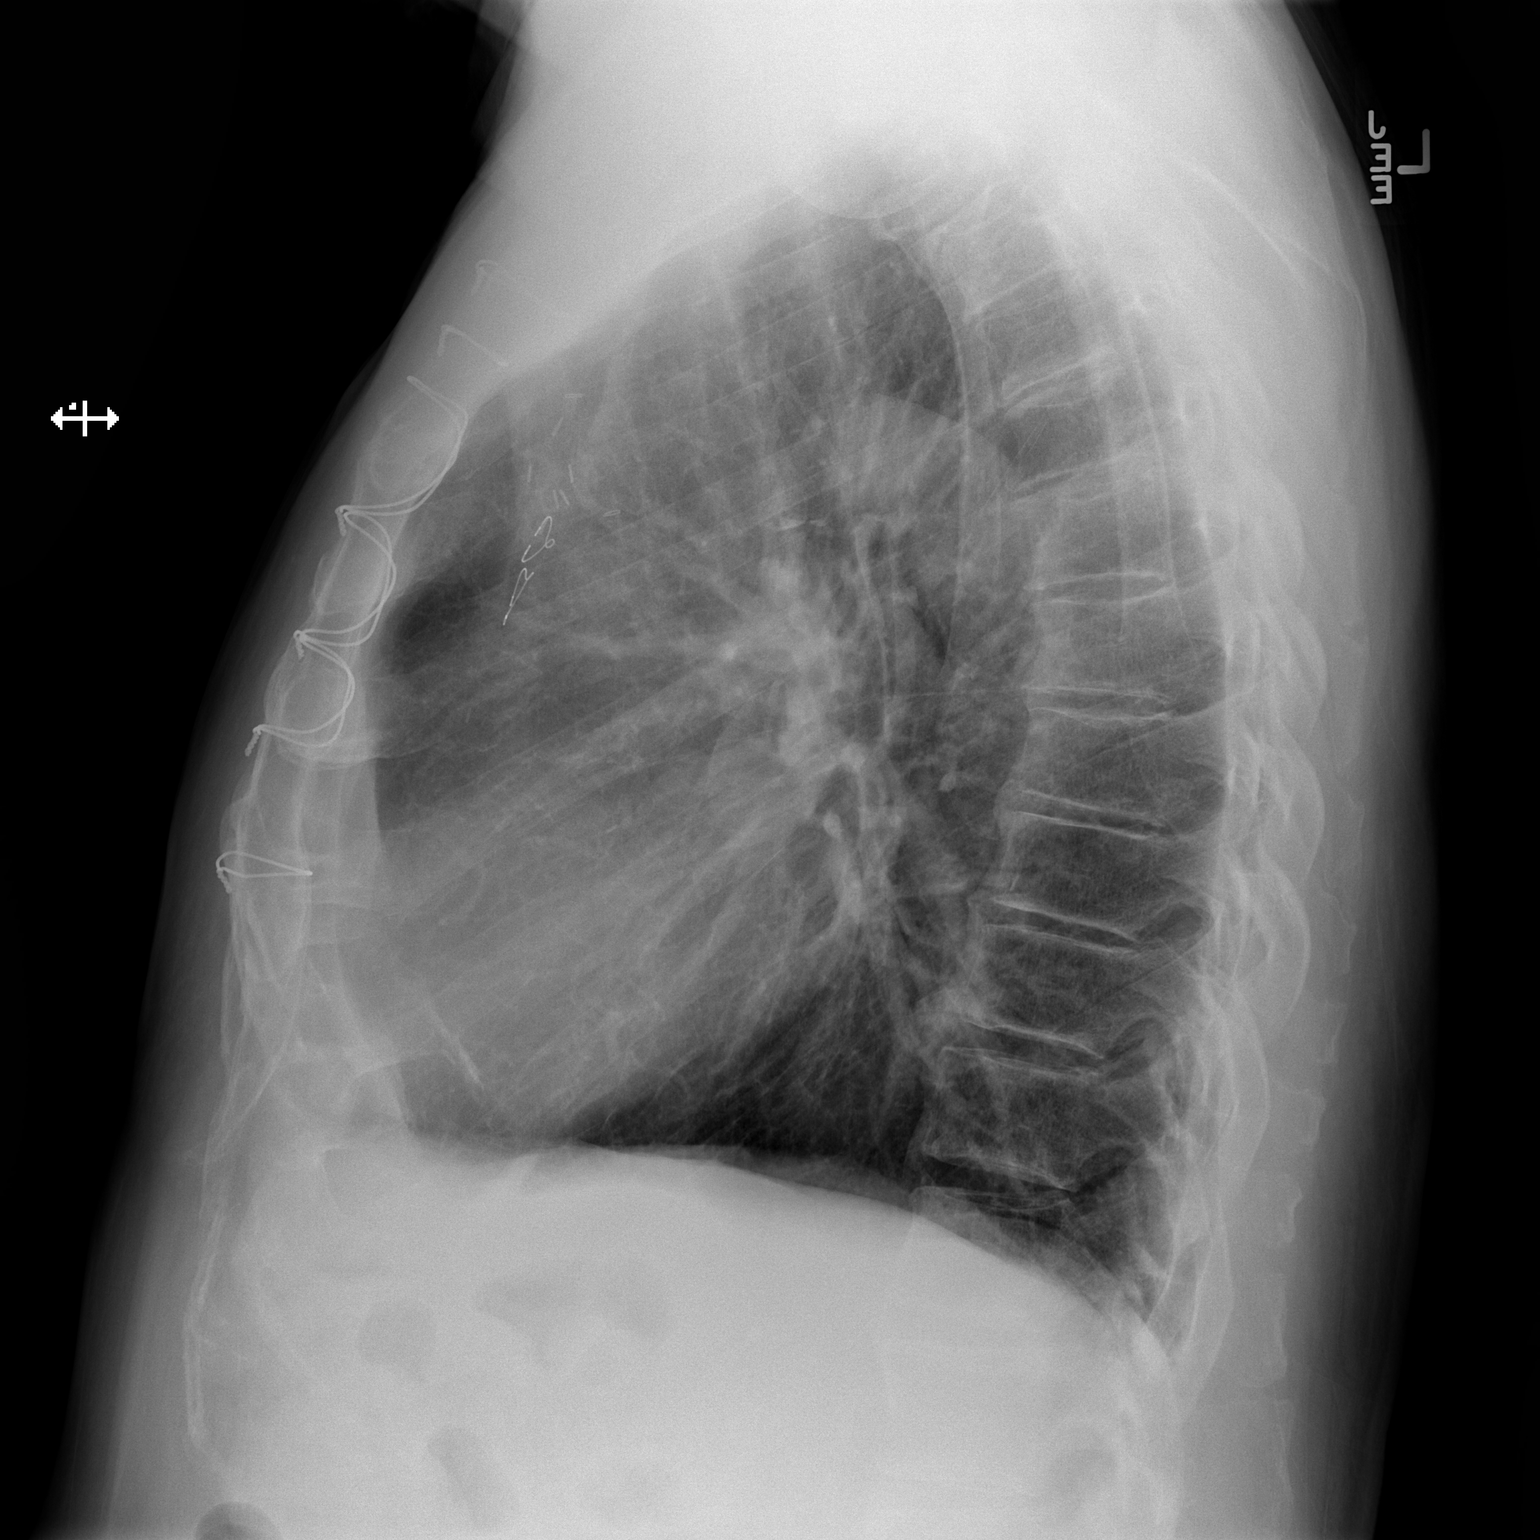

[2 of 2 positions shown; findings below may reference images not displayed]

FINDINGS: Postop CABG. Heart size and vascularity within normal limits.
Negative for infiltrate or mass. Chronic right rib fractures. Mild
amount of pleural fluid or scarring in the right lung base.
IMPRESSION: No active cardiopulmonary disease.

## 2013-12-04 NOTE — Progress Notes (Signed)
Anesthesia Chart Review:  Patient is a 73 year old male scheduled for laparoscopic cholecystectomy on 12/07/13 by Dr. Donne Hazel.  History includes former smoker, CAD s/p CABG X 3 ([place], '04), PAF, DM2, GERD, HTN, HLD, skin cancer, back surgery, right IHR, tonsillectomy.  PCP is Webb Silversmith, NP. Cardiologist is Dr. [name].  He has given permission to hold Coumadin five days prior to surgery.  Nuclear stress test on 08/16/13 showed normal myocardial perfusion without evidence of inducible ischemia, gated LVEF 64%.   EKG on 09/04/13 showed SB @ 55 bpm.  CXR on 12/04/13 showed no active cardiopulmonary disease.  Preoperative labs noted.  BUN/Cr 26/1.48.  Currently, there is no comparison labs available, but Cr is < 1.5. Glucose is 111.  H/H 11.3/34.5, PLT 125K.  PT/INR 17.1/1.43, PTT 31.  His Coumadin should be on hold by now--will order a repeat PT/INR on arrival since.  If no acute changes then I would anticipate that he could proceed as planned.  George Hugh [facility] Short Stay Center/Anesthesiology Phone [phone] 12/04/2013 6:10 PM

## 2013-12-04 NOTE — Pre-Procedure Instructions (Signed)
F…LIX RIVERS  12/04/2013   Your procedure is scheduled on:  Thursday   January 8 th 1513 PM  Report to [facility] Main Entrance "A" at 1313 PM.  Call this number if you have problems the morning of surgery: [phone]   Remember:   Do not eat food or drink liquids after midnight.   Take these medicines the morning of surgery with A SIP OF WATER: Clonidine, and Prilosec  Stop Coumadin ,Aspirin, Nsaids, Vitamins and herbal medication 5 days prior to surgery.   Do not wear jewelry, make-up or nail polish.  Do not wear lotions, powders, or perfumes. You may wear deodorant.   Men may shave face and neck.  Do not bring valuables to the hospital.  [facility] is not responsible for any belongings or valuables.               Contacts, dentures or bridgework may not be worn into surgery.  Leave suitcase in the car. After surgery it may be brought to your room.  For patients admitted to the hospital, discharge time is determined by your  treatment team.               Patients discharged the day of surgery will not be allowed to drive home.    Special Instructions: Shower using CHG 2 nights before surgery and the night before surgery.  If you shower the day of surgery use CHG.  Use special wash - you have one bottle of CHG for all showers.  You should use approximately 1/3 of the bottle for each shower.   Please read over the following fact sheets that you were given: Pain Booklet, Coughing and Deep Breathing and Surgical Site Infection Prevention

## 2013-12-06 MED ORDER — CIPROFLOXACIN IN D5W 400 MG/200ML IV SOLN
400.0000 mg | INTRAVENOUS | Status: AC
  Administered 2013-12-07: 400 mg via INTRAVENOUS
  Filled 2013-12-06: qty 200

## 2013-12-07 ENCOUNTER — Encounter (HOSPITAL_COMMUNITY): Payer: Medicare Other | Admitting: Vascular Surgery

## 2013-12-07 ENCOUNTER — Observation Stay (HOSPITAL_COMMUNITY)
Admission: RE | Admit: 2013-12-07 | Discharge: 2013-12-08 | Disposition: A | Discharge status: Home / Self Care | Payer: Medicare Other | Source: Ambulatory Visit | Attending: General Surgery | Admitting: General Surgery

## 2013-12-07 ENCOUNTER — Encounter (HOSPITAL_COMMUNITY)
Admission: RE | Disposition: A | Discharge status: Home / Self Care | Payer: Self-pay | Source: Ambulatory Visit | Attending: General Surgery

## 2013-12-07 ENCOUNTER — Encounter (HOSPITAL_COMMUNITY): Payer: Self-pay | Admitting: *Deleted

## 2013-12-07 ENCOUNTER — Ambulatory Visit (HOSPITAL_COMMUNITY): Payer: Medicare Other | Admitting: Anesthesiology

## 2013-12-07 DIAGNOSIS — Z9049 Acquired absence of other specified parts of digestive tract: Secondary | ICD-10-CM

## 2013-12-07 DIAGNOSIS — E119 Type 2 diabetes mellitus without complications: Secondary | ICD-10-CM | POA: Diagnosis not present

## 2013-12-07 DIAGNOSIS — Z87891 Personal history of nicotine dependence: Secondary | ICD-10-CM | POA: Diagnosis not present

## 2013-12-07 DIAGNOSIS — E785 Hyperlipidemia, unspecified: Secondary | ICD-10-CM | POA: Insufficient documentation

## 2013-12-07 DIAGNOSIS — K219 Gastro-esophageal reflux disease without esophagitis: Secondary | ICD-10-CM | POA: Diagnosis not present

## 2013-12-07 DIAGNOSIS — K8010 Calculus of gallbladder with chronic cholecystitis without obstruction: Principal | ICD-10-CM | POA: Insufficient documentation

## 2013-12-07 DIAGNOSIS — I10 Essential (primary) hypertension: Secondary | ICD-10-CM | POA: Diagnosis not present

## 2013-12-07 DIAGNOSIS — Z951 Presence of aortocoronary bypass graft: Secondary | ICD-10-CM | POA: Diagnosis not present

## 2013-12-07 DIAGNOSIS — K819 Cholecystitis, unspecified: Secondary | ICD-10-CM | POA: Diagnosis present

## 2013-12-07 DIAGNOSIS — K8020 Calculus of gallbladder without cholecystitis without obstruction: Secondary | ICD-10-CM | POA: Diagnosis not present

## 2013-12-07 HISTORY — PX: CHOLECYSTECTOMY: SHX55

## 2013-12-07 LAB — GLUCOSE, CAPILLARY
GLUCOSE-CAPILLARY: 127 mg/dL — AB (ref 70–99)
GLUCOSE-CAPILLARY: 179 mg/dL — AB (ref 70–99)
Glucose-Capillary: 104 mg/dL — ABNORMAL HIGH (ref 70–99)

## 2013-12-07 LAB — PROTIME-INR
INR: 1.08 (ref 0.00–1.49)
PROTHROMBIN TIME: 13.8 s (ref 11.6–15.2)

## 2013-12-07 SURGERY — LAPAROSCOPIC CHOLECYSTECTOMY
Anesthesia: General | Site: Abdomen

## 2013-12-07 MED ORDER — SPIRONOLACTONE 25 MG PO TABS
25.0000 mg | ORAL_TABLET | Freq: Every day | ORAL | Status: DC
  Administered 2013-12-07: 25 mg via ORAL
  Filled 2013-12-07 (×3): qty 1

## 2013-12-07 MED ORDER — ONDANSETRON HCL 4 MG/2ML IJ SOLN
INTRAMUSCULAR | Status: DC | PRN
  Administered 2013-12-07: 4 mg via INTRAVENOUS

## 2013-12-07 MED ORDER — ACETAMINOPHEN 325 MG PO TABS: 650.0000 mg | ORAL_TABLET | Freq: Four times a day (QID) | ORAL | Status: DC | PRN

## 2013-12-07 MED ORDER — ONDANSETRON HCL 4 MG/2ML IJ SOLN: 4.0000 mg | Freq: Four times a day (QID) | INTRAMUSCULAR | Status: DC | PRN

## 2013-12-07 MED ORDER — BUPIVACAINE-EPINEPHRINE (PF) 0.25% -1:200000 IJ SOLN
INTRAMUSCULAR | Status: AC
  Filled 2013-12-07: qty 30

## 2013-12-07 MED ORDER — MORPHINE SULFATE 2 MG/ML IJ SOLN: 2.0000 mg | INTRAMUSCULAR | Status: DC | PRN

## 2013-12-07 MED ORDER — SODIUM CHLORIDE 0.9 % IV SOLN
INTRAVENOUS | Status: DC
  Administered 2013-12-07: 20:00:00 via INTRAVENOUS

## 2013-12-07 MED ORDER — INSULIN ASPART 100 UNIT/ML ~~LOC~~ SOLN: 0.0000 [IU] | Freq: Every day | SUBCUTANEOUS | Status: DC

## 2013-12-07 MED ORDER — GLYCOPYRROLATE 0.2 MG/ML IJ SOLN
INTRAMUSCULAR | Status: DC | PRN
  Administered 2013-12-07: 0.2 mg via INTRAVENOUS
  Administered 2013-12-07: 1 mg via INTRAVENOUS

## 2013-12-07 MED ORDER — CIPROFLOXACIN IN D5W 400 MG/200ML IV SOLN
INTRAVENOUS | Status: AC
Start: 2013-12-07 — End: 2013-12-08
  Filled 2013-12-07: qty 200

## 2013-12-07 MED ORDER — ACETAMINOPHEN 650 MG RE SUPP: 650.0000 mg | Freq: Four times a day (QID) | RECTAL | Status: DC | PRN

## 2013-12-07 MED ORDER — LACTATED RINGERS IV SOLN
INTRAVENOUS | Status: DC | PRN
  Administered 2013-12-07 (×2): via INTRAVENOUS

## 2013-12-07 MED ORDER — ASPIRIN 81 MG PO TABS: 81.0000 mg | ORAL_TABLET | Freq: Every day | ORAL | Status: DC

## 2013-12-07 MED ORDER — PROPOFOL 10 MG/ML IV BOLUS
INTRAVENOUS | Status: DC | PRN
  Administered 2013-12-07: 200 mg via INTRAVENOUS

## 2013-12-07 MED ORDER — 0.9 % SODIUM CHLORIDE (POUR BTL) OPTIME
TOPICAL | Status: DC | PRN
  Administered 2013-12-07: 1000 mL

## 2013-12-07 MED ORDER — SODIUM CHLORIDE 0.9 % IR SOLN
Status: DC | PRN
  Administered 2013-12-07: 1000 mL

## 2013-12-07 MED ORDER — HYDROMORPHONE HCL PF 1 MG/ML IJ SOLN
INTRAMUSCULAR | Status: AC
  Filled 2013-12-07: qty 2

## 2013-12-07 MED ORDER — HYDROMORPHONE HCL PF 1 MG/ML IJ SOLN
0.2500 mg | INTRAMUSCULAR | Status: DC | PRN
  Administered 2013-12-07 (×2): 0.5 mg via INTRAVENOUS

## 2013-12-07 MED ORDER — INSULIN ASPART 100 UNIT/ML ~~LOC~~ SOLN: 0.0000 [IU] | Freq: Three times a day (TID) | SUBCUTANEOUS | Status: DC

## 2013-12-07 MED ORDER — FENTANYL CITRATE 0.05 MG/ML IJ SOLN
INTRAMUSCULAR | Status: DC | PRN
  Administered 2013-12-07: 100 ug via INTRAVENOUS
  Administered 2013-12-07 (×3): 50 ug via INTRAVENOUS

## 2013-12-07 MED ORDER — NEOSTIGMINE METHYLSULFATE 1 MG/ML IJ SOLN
INTRAMUSCULAR | Status: DC | PRN
  Administered 2013-12-07: 5 mg via INTRAVENOUS

## 2013-12-07 MED ORDER — HEMOSTATIC AGENTS (NO CHARGE) OPTIME
TOPICAL | Status: DC | PRN
  Administered 2013-12-07: 1 via TOPICAL

## 2013-12-07 MED ORDER — HEPARIN SODIUM (PORCINE) 5000 UNIT/ML IJ SOLN
5000.0000 [IU] | Freq: Three times a day (TID) | INTRAMUSCULAR | Status: DC
  Administered 2013-12-08: 5000 [IU] via SUBCUTANEOUS
  Filled 2013-12-07 (×4): qty 1

## 2013-12-07 MED ORDER — CLONIDINE HCL 0.2 MG PO TABS
0.2000 mg | ORAL_TABLET | Freq: Two times a day (BID) | ORAL | Status: DC
Start: 2013-12-07 — End: 2013-12-08
  Administered 2013-12-07: 0.2 mg via ORAL
  Filled 2013-12-07 (×3): qty 1

## 2013-12-07 MED ORDER — ASPIRIN EC 81 MG PO TBEC
81.0000 mg | DELAYED_RELEASE_TABLET | Freq: Every day | ORAL | Status: DC
  Administered 2013-12-07 – 2013-12-08 (×2): 81 mg via ORAL
  Filled 2013-12-07 (×2): qty 1

## 2013-12-07 MED ORDER — DEXAMETHASONE SODIUM PHOSPHATE 10 MG/ML IJ SOLN
INTRAMUSCULAR | Status: DC | PRN
  Administered 2013-12-07: 4 mg via INTRAVENOUS

## 2013-12-07 MED ORDER — OXYCODONE HCL 5 MG PO TABS: 5.0000 mg | ORAL_TABLET | ORAL | Status: DC | PRN

## 2013-12-07 MED ORDER — LACTATED RINGERS IV SOLN
INTRAVENOUS | Status: DC
Start: 2013-12-07 — End: 2013-12-07
  Administered 2013-12-07: 14:00:00 via INTRAVENOUS

## 2013-12-07 MED ORDER — LIDOCAINE HCL (CARDIAC) 20 MG/ML IV SOLN
INTRAVENOUS | Status: DC | PRN
  Administered 2013-12-07: 100 mg via INTRAVENOUS

## 2013-12-07 MED ORDER — PANTOPRAZOLE SODIUM 40 MG PO TBEC
40.0000 mg | DELAYED_RELEASE_TABLET | Freq: Every day | ORAL | Status: DC
  Administered 2013-12-08: 40 mg via ORAL
  Filled 2013-12-07: qty 1

## 2013-12-07 MED ORDER — BUPIVACAINE-EPINEPHRINE 0.25% -1:200000 IJ SOLN
INTRAMUSCULAR | Status: DC | PRN
Start: 2013-12-07 — End: 2013-12-07
  Administered 2013-12-07: 10 mL

## 2013-12-07 MED ORDER — ROCURONIUM BROMIDE 100 MG/10ML IV SOLN
INTRAVENOUS | Status: DC | PRN
  Administered 2013-12-07: 10 mg via INTRAVENOUS
  Administered 2013-12-07: 40 mg via INTRAVENOUS

## 2013-12-07 SURGICAL SUPPLY — 41 items
ADH SKN CLS APL DERMABOND .7 (GAUZE/BANDAGES/DRESSINGS) ×1
APPLIER CLIP 5 13 M/L LIGAMAX5 (MISCELLANEOUS) ×2
APR CLP MED LRG 5 ANG JAW (MISCELLANEOUS) ×1
BAG SPEC RTRVL 10 TROC 200 (ENDOMECHANICALS) ×1
BLADE SURG ROTATE 9660 (MISCELLANEOUS) IMPLANT
CANISTER SUCTION 2500CC (MISCELLANEOUS) ×2 IMPLANT
CHLORAPREP W/TINT 26ML (MISCELLANEOUS) ×2 IMPLANT
CLIP APPLIE 5 13 M/L LIGAMAX5 (MISCELLANEOUS) ×1 IMPLANT
COVER MAYO STAND STRL (DRAPES) IMPLANT
COVER SURGICAL LIGHT HANDLE (MISCELLANEOUS) ×2 IMPLANT
DECANTER SPIKE VIAL GLASS SM (MISCELLANEOUS) ×1 IMPLANT
DERMABOND ADVANCED (GAUZE/BANDAGES/DRESSINGS) ×1
DERMABOND ADVANCED .7 DNX12 (GAUZE/BANDAGES/DRESSINGS) ×1 IMPLANT
DRAPE C-ARM 42X72 X-RAY (DRAPES) IMPLANT
DRAPE UTILITY 15X26 W/TAPE STR (DRAPE) ×2 IMPLANT
ELECT REM PT RETURN 9FT ADLT (ELECTROSURGICAL) ×2
ELECTRODE REM PT RTRN 9FT ADLT (ELECTROSURGICAL) ×1 IMPLANT
GLOVE BIO SURGEON STRL SZ7 (GLOVE) ×2 IMPLANT
GLOVE BIO SURGEON STRL SZ7.5 (GLOVE) ×1 IMPLANT
GLOVE BIOGEL PI IND STRL 7.5 (GLOVE) ×1 IMPLANT
GLOVE BIOGEL PI INDICATOR 7.5 (GLOVE) ×2
GOWN STRL NON-REIN LRG LVL3 (GOWN DISPOSABLE) ×6 IMPLANT
HEMOSTAT SNOW SURGICEL 2X4 (HEMOSTASIS) ×1 IMPLANT
KIT BASIN OR (CUSTOM PROCEDURE TRAY) ×2 IMPLANT
KIT ROOM TURNOVER OR (KITS) ×2 IMPLANT
NS IRRIG 1000ML POUR BTL (IV SOLUTION) ×2 IMPLANT
PAD ARMBOARD 7.5X6 YLW CONV (MISCELLANEOUS) ×2 IMPLANT
POUCH RETRIEVAL ECOSAC 10 (ENDOMECHANICALS) ×1 IMPLANT
POUCH RETRIEVAL ECOSAC 10MM (ENDOMECHANICALS) ×1
SCISSORS LAP 5X35 DISP (ENDOMECHANICALS) ×2 IMPLANT
SET CHOLANGIOGRAPH 5 50 .035 (SET/KITS/TRAYS/PACK) IMPLANT
SET IRRIG TUBING LAPAROSCOPIC (IRRIGATION / IRRIGATOR) ×2 IMPLANT
SLEEVE ENDOPATH XCEL 5M (ENDOMECHANICALS) ×4 IMPLANT
SPECIMEN JAR SMALL (MISCELLANEOUS) ×2 IMPLANT
SUT MNCRL AB 4-0 PS2 18 (SUTURE) ×2 IMPLANT
SUT VICRYL 0 UR6 27IN ABS (SUTURE) ×2 IMPLANT
TOWEL OR 17X24 6PK STRL BLUE (TOWEL DISPOSABLE) ×1 IMPLANT
TOWEL OR 17X26 10 PK STRL BLUE (TOWEL DISPOSABLE) ×2 IMPLANT
TRAY LAPAROSCOPIC (CUSTOM PROCEDURE TRAY) ×2 IMPLANT
TROCAR XCEL BLUNT TIP 100MML (ENDOMECHANICALS) ×2 IMPLANT
TROCAR XCEL NON-BLD 5MMX100MML (ENDOMECHANICALS) ×2 IMPLANT

## 2013-12-07 NOTE — Preoperative (Signed)
Beta Blockers   Reason not to administer Beta Blockers:Not Applicable 

## 2013-12-07 NOTE — H&P (View-Only) (Signed)
Patient ID: Thomas Marshall, male   DOB: 1941-07-05, 73 y.o.   MRN: [id number]  Chief Complaint  Patient presents with  . New Evaluation    eval gallstones    HPI [name] is a 73 y.o. male.   HPI This is a 73 year old male who I know from a thigh abscess before. He has a history of coronary disease status post a bypass. He has atrial fibrillation for which he is on Coumadin and aspirin. He was doing well until last week when he was at the beach. He had severe right upper quadrant pain that woke him up tonight. He went to the emergency room there. I have the CT scans on a disc as well as the reports are reviewed both of these today. He has had no pain since then but he change his diet. He has occasional what he calls a twinge. No prior history. His bowel movements were loose around the episode. He did have some nausea and vomiting around this as well. He comes in today after he was diagnosed with cholelithiasis to discuss a possible cholecystectomy. Past Medical History  Diagnosis Date  . Cancer     skin & squamous cell  . Diabetes mellitus without complication   . GERD (gastroesophageal reflux disease)   . Hyperlipidemia   . Hypertension     Past Surgical History  Procedure Laterality Date  . Exploration post operative open heart    . Urinary surgery      scar tissue  . Ankle surgery      right fused  . Knee surgery      replacement on both knees  . Back surgery      low back   . Finger surgery      skin graft on rt index    Family History  Problem Relation Age of Onset  . Stroke Mother   . Heart disease Father   . Cancer Sister     breast  and skin  . Diabetes Sister   . Diabetes Brother   . Early death Neg Hx     Social History History  Substance Use Topics  . Smoking status: Former Smoker    Quit date: 11/30/1970  . Smokeless tobacco: Never Used  . Alcohol Use: No    Allergies  Allergen Reactions  . Carvedilol     Low heart rate   . Lisinopril Swelling   . Losartan Other (See Comments)    Increased sugar  . Triamterene Swelling  . Ampicillin Rash    Current Outpatient Prescriptions  Medication Sig Dispense Refill  . aspirin 81 MG tablet Take 81 mg by mouth daily.      [name] atorvastatin (LIPITOR) 40 MG tablet Take 40 mg by mouth daily.      . cholecalciferol (VITAMIN D) 1000 UNITS tablet Take 1,000 Units by mouth daily.      . cloNIDine (CATAPRES) 0.2 MG tablet Take 0.2 mg by mouth 2 (two) times daily.      . metFORMIN (GLUMETZA) 1000 MG (MOD) 24 hr tablet Take 1,000 mg by mouth 2 (two) times daily with a meal.       . omeprazole (PRILOSEC) 20 MG capsule Take 20 mg by mouth daily.      [name] spironolactone (ALDACTONE) 25 MG tablet Take 1 tablet (25 mg total) by mouth daily.  90 tablet  3  . vitamin C (ASCORBIC ACID) 500 MG tablet Take 500 mg by mouth daily.      [name]  warfarin (COUMADIN) 10 MG tablet Take 1 tablet (10 mg total) by mouth as directed.  90 tablet  0   No current facility-administered medications for this visit.    Review of Systems Review of Systems  Constitutional: Negative for fever, chills and unexpected weight change.  HENT: Negative for congestion, hearing loss, sore throat, trouble swallowing and voice change.   Eyes: Negative for visual disturbance.  Respiratory: Negative for cough and wheezing.   Cardiovascular: Negative for chest pain, palpitations and leg swelling.  Gastrointestinal: Positive for nausea, vomiting, abdominal pain and diarrhea. Negative for constipation, blood in stool, abdominal distention, anal bleeding and rectal pain.  Genitourinary: Negative for hematuria and difficulty urinating.  Musculoskeletal: Negative for arthralgias.  Skin: Negative for rash and wound.  Neurological: Negative for seizures, syncope, weakness and headaches.  Hematological: Negative for adenopathy. Does not bruise/bleed easily.  Psychiatric/Behavioral: Negative for confusion.    Blood pressure 150/84, pulse 52, temperature  98.2 F (36.8 C), temperature source Temporal, resp. rate 14, height 6\' 2"  (1.88 m), weight 223 lb 9.6 oz (101.424 kg).  Physical Exam Physical Exam  Vitals reviewed. Constitutional: He appears well-developed and well-nourished.  Eyes: No scleral icterus.  Cardiovascular: Normal rate, regular rhythm and normal heart sounds.   Pulmonary/Chest: Effort normal and breath sounds normal. He has no wheezes. He has no rales.  Abdominal: Soft. Bowel sounds are normal. There is no tenderness.  Lymphadenopathy:    He has no cervical adenopathy.    Data Reviewed [place] and ct scan from outside hospital  Assessment    Symptomatic cholelithiasis    Plan    Lap chole with possible cholangiogram Will discuss with Dr Burt Knack for periop risk assessment and coumadin mgt Will need aneurysm followed. I discussed the procedure in detail.  The patient was given Neurosurgeon.  We discussed the risks and benefits of a laparoscopic cholecystectomy and possible cholangiogram including, but not limited to bleeding, infection, injury to surrounding structures such as the intestine or liver, bile leak, retained gallstones, need to convert to an open procedure, prolonged diarrhea, blood clots such as  DVT, common bile duct injury, anesthesia risks, and possible need for additional procedures.  The likelihood of improvement in symptoms and return to the patient's normal status is good. We discussed the typical post-operative recovery course.         [name] 11/16/2013, 9:28 AM

## 2013-12-07 NOTE — Anesthesia Procedure Notes (Signed)
Procedure Name: Intubation Date/Time: 12/07/2013 3:43 PM Performed by: Ollen Bowl Pre-anesthesia Checklist: Patient identified, Emergency Drugs available, Suction available, Patient being monitored and Timeout performed Patient Re-evaluated:Patient Re-evaluated prior to inductionOxygen Delivery Method: Circle system utilized and Simple face mask Preoxygenation: Pre-oxygenation with 100% oxygen Intubation Type: IV induction Ventilation: Mask ventilation without difficulty Laryngoscope Size: Miller and 3 Grade View: Grade I Tube type: Oral Tube size: 7.5 mm Number of attempts: 1 Airway Equipment and Method: Patient positioned with wedge pillow and Stylet Placement Confirmation: ETT inserted through vocal cords under direct vision,  positive ETCO2 and breath sounds checked- equal and bilateral Secured at: 23 cm Tube secured with: Tape Dental Injury: Teeth and Oropharynx as per pre-operative assessment

## 2013-12-07 NOTE — Transfer of Care (Signed)
Immediate Anesthesia Transfer of Care Note  Patient: Thomas Marshall  Procedure(s) Performed: Procedure(s): LAPAROSCOPIC CHOLECYSTECTOMY  (N/A)  Patient Location: PACU  Anesthesia Type:General  Level of Consciousness: awake and alert   Airway & Oxygen Therapy: Patient Spontanous Breathing and Patient connected to nasal cannula oxygen  Post-op Assessment: Report given to PACU RN and Post -op Vital signs reviewed and stable  Post vital signs: Reviewed and stable  Complications: No apparent anesthesia complications

## 2013-12-07 NOTE — Op Note (Signed)
Preoperative diagnosis: Symptomatic cholelithiasis Postoperative diagnosis: Same as above Procedure: Laparoscopic cholecystectomy Surgeon: Dr. Serita Grammes Anesthesia: Gen. Estimated blood loss: Minimal Specimens: Gallbladder and contents to pathology Complications: None Drains: None Disposition to recovery stable Sponge needle count correct at completion  Indications: This a 73 year old male who was out of town and had an acute episode of right quadrant pain. He has cholelithiasis on ultrasound. Clinically he has biliary colic we discussed a laparoscopic cholecystectomy. He was evaluated by cardiology preoperatively.  Procedure: After informed consent was obtained the patient was taken to the operating room. He was given cefoxitin. Sequential compression devices were on his legs. He was then placed under general anesthesia without complication. His abdomen was prepped and draped in the standard sterile surgical fashion. A surgical timeout was then performed.  I infiltrated marcaine around the umbilicus. I made a vertical incision. I grasped the fashion and made an incision in this. I then entered the peritoneum bluntly. I placed a 0 Vicryl pursestring suture through the fascia. I then inserted a Hassan trocar and insufflated the abdomen to 15 mm mercury pressure. I then inserted 3 further 5 mm trocars in the epigastrium and right upper quadrant under direct vision without complication. I retracted the gallbladder cephalad and lateral. Dissection was carried out and I exposed the structures in the triangle. A critical view of safety was obtained. The cystic artery was clipped 3 times and divided leaving 2 clips in place. The duct was treated in a similar fashion. The gallbladder was then removed from the liver bed. I then placed the gallbladder in a bag and removed from the umbilicus. I then obtained hemostasis. Irrigation was performed. This was clear. I then removed my hasson trocar. I tied down  my stitch. I did place 2 additional 0 Vicryl sutures in the umbilicus to completely obliterate this defect. I then desufflated the abdomen and removed all my trocars. These were then closed with 4-0 Monocryl and Dermabond. He tolerated this well was extubated and transferred to recovery stable.

## 2013-12-07 NOTE — Interval H&P Note (Signed)
History and Physical Interval Note:  12/07/2013 3:31 PM  Thomas Marshall  has presented today for surgery, with the diagnosis of SYMPTOMATIC CHOLELITHIASIS  The various methods of treatment have been discussed with the patient and family. After consideration of risks, benefits and other options for treatment, the patient has consented to  Procedure(s): LAPAROSCOPIC CHOLECYSTECTOMY POSSIBLE IOC (N/A) as a surgical intervention .  The patient's history has been reviewed, patient examined, no change in status, stable for surgery.  I have reviewed the patient's chart and labs.  Questions were answered to the patient's satisfaction.     Stephenie Navejas

## 2013-12-07 NOTE — Progress Notes (Signed)
Dr. Chriss Driver notified of BP 93/81, MAP 84. Pt. Is cognitively intact without signs/symptoms hypovolemia. Dr. Chriss Driver approved patient sign out to the floor.

## 2013-12-07 NOTE — Anesthesia Preprocedure Evaluation (Addendum)
Anesthesia Evaluation  Patient identified by MRN, date of birth, ID band Patient awake    Reviewed: Allergy & Precautions, H&P , NPO status , Patient's Chart, lab work & pertinent test results  Airway Mallampati: II TM Distance: >3 FB Neck ROM: Full    Dental no notable dental hx. (+) Edentulous Upper and Dental Advisory Given   Pulmonary neg pulmonary ROS, former smoker,  breath sounds clear to auscultation  Pulmonary exam normal       Cardiovascular hypertension, On Medications + CAD and + CABG + dysrhythmias Atrial Fibrillation Rhythm:Irregular Rate:Normal     Neuro/Psych negative neurological ROS  negative psych ROS   GI/Hepatic Neg liver ROS, GERD-  Medicated and Controlled,  Endo/Other  diabetes, Type 2, Oral Hypoglycemic Agents  Renal/GU negative Renal ROS  negative genitourinary   Musculoskeletal   Abdominal   Peds  Hematology negative hematology ROS (+)   Anesthesia Other Findings   Reproductive/Obstetrics negative OB ROS                          Anesthesia Physical Anesthesia Plan  ASA: III  Anesthesia Plan: General   Post-op Pain Management:    Induction: Intravenous  Airway Management Planned: Oral ETT  Additional Equipment:   Intra-op Plan:   Post-operative Plan: Extubation in OR  Informed Consent: I have reviewed the patients History and Physical, chart, labs and discussed the procedure including the risks, benefits and alternatives for the proposed anesthesia with the patient or authorized representative who has indicated his/her understanding and acceptance.   Dental advisory given  Plan Discussed with: CRNA  Anesthesia Plan Comments:         Anesthesia Quick Evaluation

## 2013-12-08 ENCOUNTER — Encounter (HOSPITAL_COMMUNITY): Payer: Self-pay | Admitting: *Deleted

## 2013-12-08 DIAGNOSIS — K8010 Calculus of gallbladder with chronic cholecystitis without obstruction: Secondary | ICD-10-CM | POA: Diagnosis not present

## 2013-12-08 LAB — BASIC METABOLIC PANEL
BUN: 19 mg/dL (ref 6–23)
CALCIUM: 8.4 mg/dL (ref 8.4–10.5)
CO2: 27 mEq/L (ref 19–32)
Chloride: 103 mEq/L (ref 96–112)
Creatinine, Ser: 1.39 mg/dL — ABNORMAL HIGH (ref 0.50–1.35)
GFR calc Af Amer: 56 mL/min — ABNORMAL LOW (ref >90)
GFR, EST NON AFRICAN AMERICAN: 48 mL/min — AB (ref >90)
Glucose, Bld: 128 mg/dL — ABNORMAL HIGH (ref 70–99)
Potassium: 5 mEq/L (ref 3.7–5.3)
SODIUM: 142 meq/L (ref 137–147)

## 2013-12-08 LAB — CBC
HCT: 32.7 % — ABNORMAL LOW (ref 39.0–52.0)
Hemoglobin: 11.1 g/dL — ABNORMAL LOW (ref 13.0–17.0)
MCH: 31.4 pg (ref 26.0–34.0)
MCHC: 33.9 g/dL (ref 30.0–36.0)
MCV: 92.6 fL (ref 78.0–100.0)
PLATELETS: 115 10*3/uL — AB (ref 150–400)
RBC: 3.53 MIL/uL — ABNORMAL LOW (ref 4.22–5.81)
RDW: 13.4 % (ref 11.5–15.5)
WBC: 7.2 10*3/uL (ref 4.0–10.5)

## 2013-12-08 LAB — GLUCOSE, CAPILLARY: GLUCOSE-CAPILLARY: 118 mg/dL — AB (ref 70–99)

## 2013-12-08 NOTE — Discharge Instructions (Signed)
[facility] LAPAROSCOPIC SURGERY: POST OP INSTRUCTIONS  Always review your discharge instruction sheet given to you by the facility where your surgery was performed. IF YOU HAVE DISABILITY OR FAMILY LEAVE FORMS, YOU MUST BRING THEM TO THE OFFICE FOR PROCESSING.   DO NOT GIVE THEM TO YOUR DOCTOR.  1. A prescription for pain medication may be given to you upon discharge.  Take your pain medication as prescribed, if needed.  If narcotic pain medicine is not needed, then you may take acetaminophen (Tylenol), naprosyn (Alleve), or ibuprofen (Advil) as needed. 2. Take your usually prescribed medications unless otherwise directed. 3. If you need a refill on your pain medication, please contact your pharmacy.  They will contact our office to request authorization. Prescriptions will not be filled after 5pm or on week-ends. 4. You should follow a light diet the first few days after arrival home, such as soup and crackers, etc.  Be sure to include lots of fluids daily. 5. Most patients will experience some swelling and bruising in the area of the incisions.  Ice packs will help.  Swelling and bruising can take several days to resolve.  6. It is common to experience some constipation if taking pain medication after surgery.  Increasing fluid intake and taking a stool softener (such as Colace) will usually help or prevent this problem from occurring.  A mild laxative (Milk of Magnesia or Miralax) should be taken according to package instructions if there are no bowel movements after 48 hours. 7. Unless discharge instructions indicate otherwise, you may remove your bandages 48 hours after surgery, and you may shower at that time.  You may have steri-strips (small skin tapes) in place directly over the incision.  These strips should be left on the skin for 7-10 days.  If your surgeon used skin glue on the incision, you may shower in 24 hours.  The glue will flake  off over the next 2-3 weeks.  Any sutures or staples will be removed at the office during your follow-up visit. 8. ACTIVITIES:  You may resume regular (light) daily activities beginning the next day--such as daily self-care, walking, climbing stairs--gradually increasing activities as tolerated.  You may have sexual intercourse when it is comfortable.  Refrain from any heavy lifting or straining until approved by your doctor. a. You may drive when you are no longer taking prescription pain medication, you can comfortably wear a seatbelt, and you can safely maneuver your car and apply brakes. b. RETURN TO WORK:  __________________________________________________________ 9. You should see your doctor in the office for a follow-up appointment approximately 2-3 weeks after your surgery.  Make sure that you call for this appointment within a day or two after you arrive home to insure a convenient appointment time. 10. OTHER INSTRUCTIONS: __________________________________________________________________________________________________________________________ __________________________________________________________________________________________________________________________ WHEN TO CALL YOUR DOCTOR: 1. Fever over 101.0 2. Inability to urinate 3. Continued bleeding from incision. 4. Increased pain, redness, or drainage from the incision. 5. Increasing abdominal pain  The clinic staff is available to answer your questions during regular business hours.  Please don't hesitate to call and ask to speak to one of the nurses for clinical concerns.  If you have a medical emergency, go to the nearest emergency room or call 911.  A surgeon from [facility] is always on call at the hospital. [address], [address], [place], [place]  [address] ? [address] [address], [place], [place]   [address] [phone] ? [phone] ? [phone] Web site: [email or web address]  

## 2013-12-08 NOTE — Progress Notes (Signed)
Discharge home. Home discharge instruction given, no question verbalized. 

## 2013-12-08 NOTE — Progress Notes (Signed)
1 Day Post-Op  Subjective: Doing well, regular diet, pain controlled  Objective: Vital signs in last 24 hours: Temp:  [97.5 F (36.4 C)-98.3 F (36.8 C)] 98 F (36.7 C) (01/09 0520) Pulse Rate:  [41-50] 41 (01/09 0520) Resp:  [11-20] 16 (01/09 0520) BP: (112-183)/(58-77) 112/58 mmHg (01/09 0520) SpO2:  [94 %-100 %] 98 % (01/09 0520) Weight:  [210 lb 1.6 oz (95.301 kg)] 210 lb 1.6 oz (95.301 kg) (01/08 1935) Last BM Date: 12/07/13  Intake/Output from previous day: 01/08 0701 - 01/09 0700 In: 1261 [I.V.:1261] Out: 1400 [Urine:1400] Intake/Output this shift:    General appearance: no distress Cardio: regular rate and rhythm GI: approp tender incisions clean soft  Lab Results:   Recent Labs  12/08/13 0618  WBC 7.2  HGB 11.1*  HCT 32.7*  PLT PENDING   BMET No results found for this basename: NA, K, CL, CO2, GLUCOSE, BUN, CREATININE, CALCIUM,  in the last 72 hours PT/INR  Recent Labs  12/07/13 1326  LABPROT 13.8  INR 1.08   ABG No results found for this basename: PHART, PCO2, PO2, HCO3,  in the last 72 hours  Studies/Results: No results found.  Anti-infectives: Anti-infectives   Start     Dose/Rate Route Frequency Ordered Stop   12/07/13 1554  ciprofloxacin (CIPRO) 400 MG/200ML IVPB    Comments:  Ardine Eng   : cabinet override      12/07/13 1554 12/08/13 0359   12/07/13 0600  ciprofloxacin (CIPRO) IVPB 400 mg     400 mg 200 mL/hr over 60 Minutes Intravenous On call to O.R. 12/06/13 1355 12/07/13 1630      Assessment/Plan: POD 1 lap chole Home today if doing well with breakfast and ambulating Restart coumadin tonight  Yehudah Standing 12/08/2013

## 2013-12-08 NOTE — Anesthesia Postprocedure Evaluation (Signed)
Anesthesia Post Note  Patient: Thomas Marshall  Procedure(s) Performed: Procedure(s) (LRB): LAPAROSCOPIC CHOLECYSTECTOMY  (N/A)  Anesthesia type: General  Patient location: PACU  Post pain: Pain level controlled  Post assessment: Patient's Cardiovascular Status Stable  Last Vitals:  Filed Vitals:   12/08/13 0520  BP: 112/58  Pulse: 41  Temp: 36.7 C  Resp: 16    Post vital signs: Reviewed and stable  Level of consciousness: alert  Complications: No apparent anesthesia complications

## 2013-12-11 ENCOUNTER — Encounter (HOSPITAL_COMMUNITY): Payer: Self-pay | Admitting: General Surgery

## 2013-12-11 NOTE — Discharge Summary (Signed)
Physician Discharge Summary  Patient ID: Thomas Marshall MRN: [id number] DOB/AGE: [birth year] 73 y.o.  Admit date: 12/07/2013 Discharge date: 12/11/2013  Admission Diagnoses: Diabetes Symptomatic cholelithiasis Cad  Discharge Diagnoses:  Active Problems:   S/P laparoscopic cholecystectomy   Cholecystitis   Discharged Condition: good  Hospital Course: 72 yom with multiple cormorbidities who underwent laparoscopic cholecystectomy. He remained overnight for monitoring and was discharged home following day doing well.  Consults: None  Significant Diagnostic Studies: none  Treatments: surgery: lap chole  Disposition: 01-Home or Self Care   Future Appointments Provider Department Dept Phone   12/22/2013 10:20 AM [name], MD [facility], [place] [phone]   12/22/2013 1:15 PM [facility] [place] [facility] [phone]       Medication List         aspirin 81 MG tablet  Take 81 mg by mouth daily.     atorvastatin 40 MG tablet  Commonly known as:  LIPITOR  Take 40 mg by mouth daily.     cholecalciferol 1000 UNITS tablet  Commonly known as:  VITAMIN D  Take 1,000 Units by mouth daily.     cloNIDine 0.2 MG tablet  Commonly known as:  CATAPRES  Take 1 tablet (0.2 mg total) by mouth 2 (two) times daily.     metFORMIN 500 MG tablet  Commonly known as:  GLUCOPHAGE  Take 1,000 mg by mouth 2 (two) times daily with a meal.     omeprazole 20 MG capsule  Commonly known as:  PRILOSEC  Take 20 mg by mouth daily.     spironolactone 25 MG tablet  Commonly known as:  ALDACTONE  Take 1 tablet (25 mg total) by mouth daily.     vitamin C 500 MG tablet  Commonly known as:  ASCORBIC ACID  Take 500 mg by mouth daily.     warfarin 10 MG tablet  Commonly known as:  COUMADIN  Take 7.5-10 mg by mouth daily. 10 mg daily except 7.5 mg on Mondays & Fridays           Follow-up Information   Follow up with [facility], MD In 3  weeks.   Specialty:  General Surgery   Contact information:   [address] [place] [address] [phone]       Signed: [name] 12/11/2013, 9:45 PM

## 2013-12-22 ENCOUNTER — Ambulatory Visit (INDEPENDENT_AMBULATORY_CARE_PROVIDER_SITE_OTHER): Payer: Medicare Other | Admitting: Pharmacist

## 2013-12-22 ENCOUNTER — Encounter (INDEPENDENT_AMBULATORY_CARE_PROVIDER_SITE_OTHER): Payer: Self-pay | Admitting: General Surgery

## 2013-12-22 ENCOUNTER — Ambulatory Visit (INDEPENDENT_AMBULATORY_CARE_PROVIDER_SITE_OTHER): Payer: Medicare Other | Admitting: General Surgery

## 2013-12-22 VITALS — BP 128/72 | HR 60 | Temp 97.7°F | Resp 18 | Ht 74.0 in | Wt 224.0 lb

## 2013-12-22 DIAGNOSIS — Z7901 Long term (current) use of anticoagulants: Secondary | ICD-10-CM | POA: Diagnosis not present

## 2013-12-22 DIAGNOSIS — Z09 Encounter for follow-up examination after completed treatment for conditions other than malignant neoplasm: Secondary | ICD-10-CM

## 2013-12-22 DIAGNOSIS — I4891 Unspecified atrial fibrillation: Secondary | ICD-10-CM | POA: Diagnosis not present

## 2013-12-22 LAB — POCT INR: INR: 2.2

## 2013-12-22 NOTE — Progress Notes (Signed)
Subjective:     Patient ID: Thomas Marshall, male   DOB: 09-23-1941, 73 y.o.   MRN: [id number]  HPI This is a 73 year old male who underwent a laparoscopic cholecystectomy. He has done very well from that. He has no complaints. He is eating well. He is having bowel movements. He has some minimal pain at his umbilicus. He is pathology shows chronic cholecystitis and cholelithiasis.  Review of Systems     Objective:   Physical Exam Well-healed incisions without any evidence of infection, abdomen soft    Assessment:     Status post laparoscopic cholecystectomy with expected postoperative course     Plan:     He can begin resuming his normal activity. At this point he can come back and see me as needed.

## 2013-12-26 ENCOUNTER — Ambulatory Visit (INDEPENDENT_AMBULATORY_CARE_PROVIDER_SITE_OTHER): Payer: Medicare Other | Admitting: Surgery

## 2013-12-26 ENCOUNTER — Encounter (INDEPENDENT_AMBULATORY_CARE_PROVIDER_SITE_OTHER): Payer: Self-pay | Admitting: Surgery

## 2013-12-26 VITALS — BP 144/89 | HR 77 | Temp 98.2°F | Resp 18 | Ht 74.0 in | Wt 228.0 lb

## 2013-12-26 DIAGNOSIS — L089 Local infection of the skin and subcutaneous tissue, unspecified: Secondary | ICD-10-CM

## 2013-12-26 NOTE — Progress Notes (Signed)
[place], MD,  [address]  [place], [place]    [place] Phone:  [phone] FAX:  [phone]   Re:   Thomas Marshall DOB:   06-04-1941 MRN:   [id number]  Urgent Office  ASSESSMENT AND PLAN: 1.  Infection back of neck  I&D of infection - 12/26/2013  Plan:  Doxycycline 100 mg BID x 7 days, change dressing tomorrow and BID, see me back in about 1 week for wound check.  2.  Lap chole - 12/07/2013 - M. Donne Hazel  He saw Donne Hazel back on 12/22/2013 3.  On chronic coumadin  Last INR - 2.2 - 12/22/2013 4.  Diabetes mellitus 5.  GERD 6.  Hypertension 7.  CAD - history of CABG  Sees Dr. Burt Knack  HISTORY OF PRESENT ILLNESS: Chief Complaint  Patient presents with  . Other   Thomas Marshall is a 73 y.o. (DOB: 1941/11/01)  white  male who is a patient of BAITY, REGINA, NP and comes to me today for pain mass on the back of his neck.  He was doing well, then Sunday (12/24/2013) felt a mass on the back of his neck and it became more tender.  By this morning, he felt is was large, more tender, and he has come to the Urgent Office.  He had a prior left thigh infection managed by Dr. Donne Hazel in September 2014.  He also just had his gall bladder removed by Dr. Donne Hazel.  Past Medical History  Diagnosis Date  . Cancer     skin & squamous cell  . Diabetes mellitus without complication   . GERD (gastroesophageal reflux disease)   . Hyperlipidemia   . Hypertension   . Coronary artery disease   . PAF (paroxysmal atrial fibrillation)     SOCIAL HISTORY: Married.  Wife with patient.  PHYSICAL EXAM: BP 144/89  Pulse 77  Temp(Src) 98.2 F (36.8 C) (Temporal)  Resp 18  Ht 6\' 2"  (1.88 m)  Wt 228 lb (103.42 kg)  BMI 29.26 kg/m2  Neck, posterior: 4 mm central "nodule' of necrosis with 3 cm of surrounding inflammation  Procedure:  The back of the neck was pained with Chloroprep.  I infiltrated the area with 9 cc of 1% xylocaine.  I  excised a core of infected skin (about 1.5 cm).  There was not enough material to culture. I packed the wound with damp gauze and sterilely dressed it.  DATA REVIEWED: Epic notes  Alphonsa Overall, MD,  [facility], [place] [address]  [place], [place]    [place] Phone:  [phone] FAX:  [phone]

## 2014-01-01 DIAGNOSIS — L570 Actinic keratosis: Secondary | ICD-10-CM | POA: Diagnosis not present

## 2014-01-01 DIAGNOSIS — L821 Other seborrheic keratosis: Secondary | ICD-10-CM | POA: Diagnosis not present

## 2014-01-01 DIAGNOSIS — L03221 Cellulitis of neck: Secondary | ICD-10-CM | POA: Diagnosis not present

## 2014-01-01 DIAGNOSIS — L0211 Cutaneous abscess of neck: Secondary | ICD-10-CM | POA: Diagnosis not present

## 2014-01-01 DIAGNOSIS — L578 Other skin changes due to chronic exposure to nonionizing radiation: Secondary | ICD-10-CM | POA: Diagnosis not present

## 2014-01-04 ENCOUNTER — Ambulatory Visit (INDEPENDENT_AMBULATORY_CARE_PROVIDER_SITE_OTHER): Payer: Medicare Other | Admitting: Surgery

## 2014-01-04 ENCOUNTER — Encounter (INDEPENDENT_AMBULATORY_CARE_PROVIDER_SITE_OTHER): Payer: Self-pay | Admitting: Surgery

## 2014-01-04 VITALS — BP 134/78 | HR 62 | Resp 18 | Ht 74.0 in | Wt 223.0 lb

## 2014-01-04 DIAGNOSIS — L089 Local infection of the skin and subcutaneous tissue, unspecified: Secondary | ICD-10-CM

## 2014-01-04 NOTE — Progress Notes (Signed)
[place], MD,  [address]  [place], [place]    [place] Phone:  [phone] FAX:  [phone]   Re:   Thomas Marshall DOB:   1941-05-05 MRN:   [id number]  ASSESSMENT AND PLAN: 1.  Infection back of neck  I&D of infection - 12/26/2013  This is much better.    I made this his last visit.  If he feels a residual mass after 3 to 4 months, he will contact our office about being re-examined.  Return PRN  2.  Lap chole - 12/07/2013 - M. Donne Hazel  He saw Donne Hazel back on 12/22/2013 3.  On chronic coumadin  Last INR - 2.2 - 12/22/2013 4.  Diabetes mellitus 5.  GERD 6.  Hypertension 7.  CAD - history of CABG  Sees Dr. Burt Knack 8.  Right groin pain.  No evidence of recurrent hernia.  HISTORY OF PRESENT ILLNESS: Chief Complaint  Patient presents with  . Routine Post Op    cyst on neck   Thomas Marshall is a 73 y.o. (DOB: 1941-02-25)  white  male who is a patient of BAITY, REGINA, NP and comes to me today for pain mass on the back of his neck. Wife with patient. His neck is much better.  No pain and evidenc eof infection gone. 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 He had an open right inguinal hernia in [facility] hospital in 2013 and he wanted me to check right groin pain.  History of cyst of back of neck (Jan 2015): He was doing well, then Sunday (12/24/2013) felt a mass on the back of his neck and it became more tender.  By this morning, he felt is was large, more tender, and he has come to the Urgent Office. He had a prior left thigh infection managed by Dr. Donne Hazel in September 2014.  He also just had his gall bladder removed by Dr. Donne Hazel.  Past Medical History  Diagnosis Date  . Cancer     skin & squamous cell  . Diabetes mellitus without complication   . GERD (gastroesophageal reflux disease)   . Hyperlipidemia   . Hypertension   . Coronary artery disease   . PAF (paroxysmal atrial  fibrillation)    SOCIAL HISTORY: Married.  Wife with patient.  PHYSICAL EXAM: BP 134/78  Pulse 62  Resp 18  Ht 6\' 2"  (1.88 m)  Wt 223 lb (101.152 kg)  BMI 28.62 kg/m2  Neck, posterior: Scar on back of neck.  Inflammation all gone.  Some residual induration, that should resolve. Abdomen:  No evidence of right or left inguinal hernia.  Right inguinal scar okay without mass.  DATA REVIEWED: Epic notes  Alphonsa Overall, MD,  [facility], [place] [address]  [place], [place]    [place] Phone:  [phone] FAX:  [phone]

## 2014-01-19 ENCOUNTER — Ambulatory Visit (INDEPENDENT_AMBULATORY_CARE_PROVIDER_SITE_OTHER): Payer: Medicare Other | Admitting: Pharmacist

## 2014-01-19 DIAGNOSIS — Z7901 Long term (current) use of anticoagulants: Secondary | ICD-10-CM

## 2014-01-19 DIAGNOSIS — I4891 Unspecified atrial fibrillation: Secondary | ICD-10-CM | POA: Diagnosis not present

## 2014-01-19 LAB — POCT INR: INR: 2.1

## 2014-01-26 DIAGNOSIS — E119 Type 2 diabetes mellitus without complications: Secondary | ICD-10-CM | POA: Diagnosis not present

## 2014-01-26 DIAGNOSIS — H5200 Hypermetropia, unspecified eye: Secondary | ICD-10-CM | POA: Diagnosis not present

## 2014-02-16 DIAGNOSIS — L570 Actinic keratosis: Secondary | ICD-10-CM | POA: Diagnosis not present

## 2014-02-19 ENCOUNTER — Encounter (INDEPENDENT_AMBULATORY_CARE_PROVIDER_SITE_OTHER): Payer: Self-pay | Admitting: General Surgery

## 2014-02-19 ENCOUNTER — Encounter (INDEPENDENT_AMBULATORY_CARE_PROVIDER_SITE_OTHER): Payer: Self-pay

## 2014-02-19 ENCOUNTER — Ambulatory Visit (INDEPENDENT_AMBULATORY_CARE_PROVIDER_SITE_OTHER): Payer: Medicare Other | Admitting: General Surgery

## 2014-02-19 VITALS — BP 142/78 | HR 80 | Temp 97.2°F | Resp 16 | Ht 74.0 in | Wt 232.0 lb

## 2014-02-19 DIAGNOSIS — L723 Sebaceous cyst: Secondary | ICD-10-CM | POA: Diagnosis not present

## 2014-02-19 DIAGNOSIS — L729 Follicular cyst of the skin and subcutaneous tissue, unspecified: Secondary | ICD-10-CM

## 2014-02-19 NOTE — Progress Notes (Signed)
Subjective:     Patient ID: Thomas Marshall, male   DOB: 20-Jan-1941, 72 y.o.   MRN: [id number]  HPI The patient is a 73 year old male who has a chief complaint of a right axillary nodule. The patient's been in for 2 days. He had no tenderness or erythema. He presents initially to his wife's may concern of possible cancer as her father was diagnosed with cancer from axillary node.   The patient otherwise has no complaints from his posterior neck drainage  Review of Systems  Constitutional: Negative.   HENT: Negative.   Eyes: Negative.   Respiratory: Negative.   Cardiovascular: Negative.   Gastrointestinal: Negative.   Endocrine: Negative.   Neurological: Negative.        Objective:   Physical Exam  Constitutional: He is oriented to person, place, and time. He appears well-developed and well-nourished.  HENT:  Head: Normocephalic and atraumatic.  Eyes: Conjunctivae and EOM are normal. Pupils are equal, round, and reactive to light.  Neck: Normal range of motion. Neck supple.  Cardiovascular: Normal rate, regular rhythm and normal heart sounds.   Pulmonary/Chest: Effort normal and breath sounds normal.  Abdominal: Soft. Bowel sounds are normal. He exhibits no distension and no mass. There is no tenderness. There is no rebound and no guarding.  Musculoskeletal: Normal range of motion.  Neurological: He is alert and oriented to person, place, and time.  Skin: Skin is warm and dry.          Assessment:     73 year old male with a right axillary cyst, noninfectious     Plan:     1.  I do not believe this is a Infectious cause it requires any type of incision and drainage.  To cure this is a patient does say he vomits at this time. I not feel the patient needs any antibiotics at this time. Discussed the potential be a sebaceous cyst however did not seem infected at this time. I would not move to excise this electively at this time since he is on Coumadin last INR of 2.1.

## 2014-02-23 ENCOUNTER — Ambulatory Visit (INDEPENDENT_AMBULATORY_CARE_PROVIDER_SITE_OTHER): Payer: Medicare Other | Admitting: Pharmacist

## 2014-02-23 DIAGNOSIS — Z7901 Long term (current) use of anticoagulants: Secondary | ICD-10-CM

## 2014-02-23 DIAGNOSIS — I4891 Unspecified atrial fibrillation: Secondary | ICD-10-CM

## 2014-02-23 LAB — POCT INR: INR: 2.1

## 2014-02-27 ENCOUNTER — Other Ambulatory Visit: Payer: Self-pay | Admitting: Cardiovascular Disease

## 2014-03-09 ENCOUNTER — Encounter: Payer: Self-pay | Admitting: Podiatrist

## 2014-03-09 ENCOUNTER — Ambulatory Visit (INDEPENDENT_AMBULATORY_CARE_PROVIDER_SITE_OTHER): Payer: Medicare Other | Admitting: Podiatrist

## 2014-03-09 VITALS — BP 154/64 | HR 60 | Resp 12

## 2014-03-09 DIAGNOSIS — B351 Tinea unguium: Secondary | ICD-10-CM | POA: Diagnosis not present

## 2014-03-09 DIAGNOSIS — M79609 Pain in unspecified limb: Secondary | ICD-10-CM

## 2014-03-09 NOTE — Progress Notes (Signed)
   Subjective:    Patient ID: Thomas Marshall, male    DOB: 18-Jan-1941, 73 y.o.   MRN: [id number]  HPI PT STATED B/L TOENAILS ARE THICK AND HARD TO TRIM.    Review of Systems  HENT: Positive for ear discharge and sinus pressure.   Hematological: Bruises/bleeds easily.  All other systems reviewed and are negative.      Objective:   Physical Exam GENERAL APPEARANCE: Alert, conversant. Appropriately groomed. No acute distress.  VASCULAR: Pedal pulses palpable at 1/4 DP and PT bilateral.  Capillary refill time is immediate to all digits,  Proximal to distal cooling it warm to warm.  Digital hair growth is present bilateral  NEUROLOGIC: sensation is intact epicritically and protectively to 5.07 monofilament at 5/5 sites bilateral.  Light touch is intact bilateral, vibratory sensation intact bilateral, achilles tendon reflex is intact bilateral.  MUSCULOSKELETAL: Prominent fifth metatarsal laterally right is noted. Left is within acceptable limits. Otherwise rectus foot type is seen. Intrinsic musculature is intact. No Digital deformity is present DERMATOLOGIC:  toenails are elongated, thickened, discolored, dystrophic x10. Hyperkeratotic callus present at the fifth metatarsal base right. Otherwise skin color, texture, and turger are within normal limits.  No preulcerative lesions are seen, no interdigital maceration noted.  No open lesions present.       Assessment & Plan:  Diabetic with no classic findings, symptomatic mycotic toenails, patient is on coumadin  Plan: Toenails were debrided today without complication. Hyperkeratotic lesion was pared fifth metatarsal right. He'll be seen back as needed. [place] there to live.

## 2014-03-09 NOTE — Patient Instructions (Signed)
Diabetes and Foot Care Diabetes may cause you to have problems because of poor blood supply (circulation) to your feet and legs. This may cause the skin on your feet to become thinner, break easier, and heal more slowly. Your skin may become dry, and the skin may peel and crack. You may also have nerve damage in your legs and feet causing decreased feeling in them. You may not notice minor injuries to your feet that could lead to infections or more serious problems. Taking care of your feet is one of the most important things you can do for yourself.  HOME CARE INSTRUCTIONS  Wear shoes at all times, even in the house. Do not go barefoot. Bare feet are easily injured.  Check your feet daily for blisters, cuts, and redness. If you cannot see the bottom of your feet, use a mirror or ask someone for help.  Wash your feet with warm water (do not use hot water) and mild soap. Then pat your feet and the areas between your toes until they are completely dry. Do not soak your feet as this can dry your skin.  Apply a moisturizing lotion or petroleum jelly (that does not contain alcohol and is unscented) to the skin on your feet and to dry, brittle toenails. Do not apply lotion between your toes.  Trim your toenails straight across. Do not dig under them or around the cuticle. File the edges of your nails with an emery board or nail file.  Do not cut corns or calluses or try to remove them with medicine.  Wear clean socks or stockings every day. Make sure they are not too tight. Do not wear knee-high stockings since they may decrease blood flow to your legs.  Wear shoes that fit properly and have enough cushioning. To break in new shoes, wear them for just a few hours a day. This prevents you from injuring your feet. Always look in your shoes before you put them on to be sure there are no objects inside.  Do not cross your legs. This may decrease the blood flow to your feet.  If you find a minor scrape,  cut, or break in the skin on your feet, keep it and the skin around it clean and dry. These areas may be cleansed with mild soap and water. Do not cleanse the area with peroxide, alcohol, or iodine.  When you remove an adhesive bandage, be sure not to damage the skin around it.  If you have a wound, look at it several times a day to make sure it is healing.  Do not use heating pads or hot water bottles. They may burn your skin. If you have lost feeling in your feet or legs, you may not know it is happening until it is too late.  Make sure your health care provider performs a complete foot exam at least annually or more often if you have foot problems. Report any cuts, sores, or bruises to your health care provider immediately. SEEK MEDICAL CARE IF:   You have an injury that is not healing.  You have cuts or breaks in the skin.  You have an ingrown nail.  You notice redness on your legs or feet.  You feel burning or tingling in your legs or feet.  You have pain or cramps in your legs and feet.  Your legs or feet are numb.  Your feet always feel cold. SEEK IMMEDIATE MEDICAL CARE IF:   There is increasing redness,   swelling, or pain in or around a wound.  There is a red line that goes up your leg.  Pus is coming from a wound.  You develop a fever or as directed by your health care provider.  You notice a bad smell coming from an ulcer or wound. Document Released: 11/13/2000 Document Revised: 07/19/2013 Document Reviewed: 04/25/2013 ExitCare Patient Information 2014 [employer].  

## 2014-03-28 DIAGNOSIS — H60399 Other infective otitis externa, unspecified ear: Secondary | ICD-10-CM | POA: Diagnosis not present

## 2014-03-30 ENCOUNTER — Ambulatory Visit (INDEPENDENT_AMBULATORY_CARE_PROVIDER_SITE_OTHER): Payer: Medicare Other

## 2014-03-30 DIAGNOSIS — I4891 Unspecified atrial fibrillation: Secondary | ICD-10-CM

## 2014-03-30 DIAGNOSIS — Z7901 Long term (current) use of anticoagulants: Secondary | ICD-10-CM | POA: Diagnosis not present

## 2014-03-30 LAB — POCT INR: INR: 1.8

## 2014-04-03 ENCOUNTER — Ambulatory Visit (INDEPENDENT_AMBULATORY_CARE_PROVIDER_SITE_OTHER): Payer: Medicare Other | Admitting: Family Medicine

## 2014-04-03 ENCOUNTER — Encounter: Payer: Self-pay | Admitting: Family Medicine

## 2014-04-03 VITALS — BP 141/53 | HR 57 | Temp 99.1°F | Ht 74.0 in | Wt 229.0 lb

## 2014-04-03 DIAGNOSIS — E785 Hyperlipidemia, unspecified: Secondary | ICD-10-CM | POA: Diagnosis not present

## 2014-04-03 DIAGNOSIS — Z79899 Other long term (current) drug therapy: Secondary | ICD-10-CM

## 2014-04-03 DIAGNOSIS — R399 Unspecified symptoms and signs involving the genitourinary system: Secondary | ICD-10-CM

## 2014-04-03 DIAGNOSIS — I4891 Unspecified atrial fibrillation: Secondary | ICD-10-CM | POA: Diagnosis not present

## 2014-04-03 DIAGNOSIS — Z85828 Personal history of other malignant neoplasm of skin: Secondary | ICD-10-CM | POA: Diagnosis not present

## 2014-04-03 DIAGNOSIS — I10 Essential (primary) hypertension: Secondary | ICD-10-CM

## 2014-04-03 DIAGNOSIS — R972 Elevated prostate specific antigen [PSA]: Secondary | ICD-10-CM | POA: Diagnosis not present

## 2014-04-03 DIAGNOSIS — Z125 Encounter for screening for malignant neoplasm of prostate: Secondary | ICD-10-CM | POA: Diagnosis not present

## 2014-04-03 DIAGNOSIS — E119 Type 2 diabetes mellitus without complications: Secondary | ICD-10-CM | POA: Diagnosis not present

## 2014-04-03 DIAGNOSIS — R3989 Other symptoms and signs involving the genitourinary system: Secondary | ICD-10-CM | POA: Diagnosis not present

## 2014-04-03 DIAGNOSIS — Z23 Encounter for immunization: Secondary | ICD-10-CM

## 2014-04-03 LAB — CBC
HEMATOCRIT: 33.7 % — AB (ref 39.0–52.0)
HEMOGLOBIN: 11.5 g/dL — AB (ref 13.0–17.0)
MCH: 29.6 pg (ref 26.0–34.0)
MCHC: 34.1 g/dL (ref 30.0–36.0)
MCV: 86.6 fL (ref 78.0–100.0)
Platelets: 147 10*3/uL — ABNORMAL LOW (ref 150–400)
RBC: 3.89 MIL/uL — AB (ref 4.22–5.81)
RDW: 15.1 % (ref 11.5–15.5)
WBC: 5.5 10*3/uL (ref 4.0–10.5)

## 2014-04-03 LAB — POCT GLYCOSYLATED HEMOGLOBIN (HGB A1C): HEMOGLOBIN A1C: 6.2

## 2014-04-03 MED ORDER — ZOSTER VACCINE LIVE 19400 UNT/0.65ML ~~LOC~~ SOLR: 0.6500 mL | Freq: Once | SUBCUTANEOUS | Status: DC

## 2014-04-03 NOTE — Assessment & Plan Note (Signed)
Currently at goal. Patient with hx of CAD s/p CABG, but intolerance/allergy to Coreg, ACEI/ARB which have proven mortality benefit. Will continue Clonidine and Spironolactone.

## 2014-04-03 NOTE — Patient Instructions (Signed)
It was nice to see you today.  You are doing well.  Continue taking your medications as prescribed.    Follow up in 6 months or earlier if needed.

## 2014-04-03 NOTE — Assessment & Plan Note (Signed)
Lipid panel today. Continue Lipitor. 

## 2014-04-03 NOTE — Assessment & Plan Note (Signed)
On Warfarin. Being followed by Dr. Burt Knack, Cardiology.

## 2014-04-03 NOTE — Assessment & Plan Note (Signed)
Well controlled.  A1C 6.2. After discussion with patient, will decrease Metformin dose given good control.

## 2014-04-03 NOTE — Progress Notes (Signed)
   Subjective:    Patient ID: Thomas Marshall, male    DOB: 1941-11-08, 73 y.o.   MRN: [id number]  HPI 73 year old male with PMH of HTN, Afib on Coumadin, DM-2, & HLD presents to establish care.   PMH, Surgical Hx, Family Hx, Soc Hx, Meds/Allergies reviewed.   1) HTN Disease Monitoring: Home BP Monitoring - No Medications:Spironolactone, Clondine Compliance -  Yes ROS: Denies chest pain, SOB, lightheadedness/dizziness  2) Diabetes mellitus, type 2 Disease Monitoring  Blood Sugar Ranges: 110's - 120's (checks only occasionally)  Polyuria: No  Visual problems: No  Medication Compliance: yes; compliant with Metformin  3) HLD - Compliant with Lipitor - No reported side effects.  Review of Systems Per HPI    Objective:   Physical Exam Filed Vitals:   04/03/14 1317  BP: 141/53  Pulse: 57  Temp: 99.1 F (37.3 C)  Exam: General: well appearing elderly gentleman; appears younger than stated age.  NAD.  HEENT: NCAT.  Normal TM's bilaterally.  Oropharynx clear.  Upper dentures noted.  Cardiovascular: RRR. 2/6 soft systolic murmur noted.  Respiratory: CTAB. No rales, rhonchi, or wheeze. Abdomen: soft, nontender, nondistended. Extremities: No LE edema.    Assessment & Plan:  See Problem List

## 2014-04-04 LAB — COMPLETE METABOLIC PANEL WITH GFR
ALT: 17 U/L (ref 0–53)
AST: 18 U/L (ref 0–37)
Albumin: 3.9 g/dL (ref 3.5–5.2)
Alkaline Phosphatase: 62 U/L (ref 39–117)
BUN: 22 mg/dL (ref 6–23)
CALCIUM: 8.3 mg/dL — AB (ref 8.4–10.5)
CHLORIDE: 105 meq/L (ref 96–112)
CO2: 27 meq/L (ref 19–32)
Creat: 1.31 mg/dL (ref 0.50–1.35)
GFR, EST AFRICAN AMERICAN: 62 mL/min
GFR, Est Non African American: 53 mL/min — ABNORMAL LOW
Glucose, Bld: 127 mg/dL — ABNORMAL HIGH (ref 70–99)
Potassium: 4.1 mEq/L (ref 3.5–5.3)
Sodium: 139 mEq/L (ref 135–145)
Total Bilirubin: 0.4 mg/dL (ref 0.2–1.2)
Total Protein: 6.2 g/dL (ref 6.0–8.3)

## 2014-04-04 LAB — LIPID PANEL
Cholesterol: 119 mg/dL (ref 0–200)
HDL: 31 mg/dL — ABNORMAL LOW (ref >39)
LDL Cholesterol: 56 mg/dL (ref 0–99)
Total CHOL/HDL Ratio: 3.8 Ratio
Triglycerides: 159 mg/dL — ABNORMAL HIGH (ref <150)
VLDL: 32 mg/dL (ref 0–40)

## 2014-04-04 LAB — PSA: PSA: 0.7 ng/mL (ref <=4.00)

## 2014-04-06 ENCOUNTER — Encounter: Payer: Self-pay | Admitting: Family Medicine

## 2014-04-16 ENCOUNTER — Ambulatory Visit (INDEPENDENT_AMBULATORY_CARE_PROVIDER_SITE_OTHER): Payer: Medicare Other | Admitting: *Deleted

## 2014-04-16 ENCOUNTER — Other Ambulatory Visit: Payer: Medicare Other

## 2014-04-16 DIAGNOSIS — I4891 Unspecified atrial fibrillation: Secondary | ICD-10-CM

## 2014-04-16 DIAGNOSIS — Z5181 Encounter for therapeutic drug level monitoring: Secondary | ICD-10-CM

## 2014-04-16 DIAGNOSIS — Z7901 Long term (current) use of anticoagulants: Secondary | ICD-10-CM | POA: Diagnosis not present

## 2014-04-16 LAB — POCT INR: INR: 1.5

## 2014-04-17 ENCOUNTER — Other Ambulatory Visit: Payer: Medicare Other

## 2014-04-19 ENCOUNTER — Other Ambulatory Visit: Payer: Medicare Other

## 2014-04-20 DIAGNOSIS — M1710 Unilateral primary osteoarthritis, unspecified knee: Secondary | ICD-10-CM | POA: Diagnosis not present

## 2014-04-25 ENCOUNTER — Ambulatory Visit (INDEPENDENT_AMBULATORY_CARE_PROVIDER_SITE_OTHER): Payer: Medicare Other | Admitting: Cardiovascular Disease

## 2014-04-25 ENCOUNTER — Encounter: Payer: Self-pay | Admitting: Cardiovascular Disease

## 2014-04-25 ENCOUNTER — Ambulatory Visit (INDEPENDENT_AMBULATORY_CARE_PROVIDER_SITE_OTHER): Payer: Medicare Other | Admitting: Pharmacist

## 2014-04-25 VITALS — BP 130/70 | HR 50 | Ht 74.0 in | Wt 229.0 lb

## 2014-04-25 DIAGNOSIS — Z7901 Long term (current) use of anticoagulants: Secondary | ICD-10-CM | POA: Diagnosis not present

## 2014-04-25 DIAGNOSIS — I714 Abdominal aortic aneurysm, without rupture, unspecified: Secondary | ICD-10-CM | POA: Diagnosis not present

## 2014-04-25 DIAGNOSIS — Z5181 Encounter for therapeutic drug level monitoring: Secondary | ICD-10-CM

## 2014-04-25 DIAGNOSIS — I4891 Unspecified atrial fibrillation: Secondary | ICD-10-CM

## 2014-04-25 LAB — POCT INR: INR: 2.2

## 2014-04-25 MED ORDER — NITROGLYCERIN 0.4 MG SL SUBL: 0.4000 mg | SUBLINGUAL_TABLET | SUBLINGUAL | Status: DC | PRN

## 2014-04-25 NOTE — Patient Instructions (Signed)
Your physician has requested that you have an abdominal aorta duplex. During this test, an ultrasound is used to evaluate the aorta. Allow 30 minutes for this exam. Do not eat after midnight the day before and avoid carbonated beverages  Your physician wants you to follow-up in: 6 MONTHS with Dr Burt Knack.  You will receive a reminder letter in the mail two months in advance. If you don't receive a letter, please call our office to schedule the follow-up appointment.  Your physician recommends that you continue on your current medications as directed. Please refer to the Current Medication list given to you today.

## 2014-04-25 NOTE — Progress Notes (Signed)
HPI:  73 year old gentleman presenting for followup evaluation. The patient has coronary artery disease status post four-vessel CABG in 2004. Other chronic cardiac problems include hypertension, hyperlipidemia, and paroxysmal atrial fibrillation treated with amiodarone and anticoagulated with warfarin. Most recent cardiac evaluation included an event monitor demonstrating sinus bradycardia with PVCs. A Myoview scan demonstrating no significant ischemia.  The patient is doing well from a clinical perspective. He underwent laparoscopic cholecystectomy several months ago. He has recovered well and had no perioperative issues. He specifically denies chest pain or pressure, dyspnea, edema, or heart palpitations. He was noted to have an abdominal aortic aneurysm measuring 3.2 cm in maximal diameter diagnosed incidentally on the right upper quadrant ultrasound. This was performed in December 2014.    Outpatient Encounter Prescriptions as of 04/25/2014  Medication Sig  . aspirin 81 MG tablet Take 81 mg by mouth daily.  [name] atorvastatin (LIPITOR) 40 MG tablet Take 40 mg by mouth daily.  . cholecalciferol (VITAMIN D) 1000 UNITS tablet Take 1,000 Units by mouth daily.  . cloNIDine (CATAPRES) 0.2 MG tablet Take 1 tablet (0.2 mg total) by mouth 2 (two) times daily.  . metFORMIN (GLUCOPHAGE) 500 MG tablet Take 1,000 mg by mouth 2 (two) times daily with a meal.  . omeprazole (PRILOSEC) 20 MG capsule Take 20 mg by mouth daily.  [name] spironolactone (ALDACTONE) 25 MG tablet Take 1 tablet (25 mg total) by mouth daily.  . vitamin C (ASCORBIC ACID) 500 MG tablet Take 500 mg by mouth daily.  [name] warfarin (COUMADIN) 10 MG tablet Take 7.5-10 mg by mouth daily. 10 mg daily except 7.5 mg on Mondays  . warfarin (COUMADIN) 5 MG tablet TAKE AS DIRECTED BY COUMADIN CLINIC  . zoster vaccine live, PF, (ZOSTAVAX) 16109 UNT/0.65ML injection Inject 19,400 Units into the skin once.  . [DISCONTINUED] warfarin (COUMADIN) 10 MG tablet  TAKE ONE TABLET (10MG  TOTAL) BY MOUTH AS DIRECTED    Allergies  Allergen Reactions  . Carvedilol     Low heart rate   . Lisinopril Swelling  . Losartan Other (See Comments)    Increased sugar  . Nifedipine Swelling  . Triamterene Swelling  . Ampicillin Rash    Past Medical History  Diagnosis Date  . Cancer     skin & squamous cell  . Diabetes mellitus without complication   . GERD (gastroesophageal reflux disease)   . Hyperlipidemia   . Hypertension   . Coronary artery disease   . PAF (paroxysmal atrial fibrillation)     ROS: Negative except as per HPI  BP 130/70  Pulse 50  Ht 6\' 2"  (1.88 m)  Wt 103.874 kg (229 lb)  BMI 29.39 kg/m2  PHYSICAL EXAM: Pt is alert and oriented, NAD HEENT: normal Neck: JVP - normal, carotids 2+= without bruits Lungs: CTA bilaterally CV: Bradycardic and regular without murmur or gallop Abd: soft, NT, Positive BS, no hepatomegaly Ext: no C/C/E, distal pulses intact and equal Skin: warm/dry no rash  EKG: Sinus bradycardia 50 beats per minute, otherwise within normal limits.  ASSESSMENT AND PLAN: 1. Coronary artery disease, native vessel. The patient is status post CABG.  He will continue on his current medical management as he has no symptoms of angina.  2. Paroxysmal atrial fibrillation. He will be maintained on anticoagulation with warfarin. He is maintaining sinus rhythm off of amiodarone. He reports no bleeding problems.  3. Essential hypertension. Blood pressure is controlled on clonidine and Aldactone.  4. Hyperlipidemia. Last lab showed a cholesterol of  119, triglycerides 159, HDL 31, and LDL 56. The patient's liver function tests were within normal limits.  5. Abdominal aortic aneurysm. Will repeat abdominal aortic duplex ultrasound for surveillance. If stable with maximal diameter around 3.2 cm, will plan on yearly ultrasounds thereafter.  [name] 04/25/2014 1:58 PM

## 2014-04-26 ENCOUNTER — Ambulatory Visit: Payer: Medicare Other | Admitting: Cardiovascular Disease

## 2014-05-01 ENCOUNTER — Encounter (HOSPITAL_COMMUNITY): Payer: Medicare Other

## 2014-05-01 DIAGNOSIS — L570 Actinic keratosis: Secondary | ICD-10-CM | POA: Diagnosis not present

## 2014-05-01 DIAGNOSIS — L578 Other skin changes due to chronic exposure to nonionizing radiation: Secondary | ICD-10-CM | POA: Diagnosis not present

## 2014-05-01 DIAGNOSIS — L03119 Cellulitis of unspecified part of limb: Secondary | ICD-10-CM | POA: Diagnosis not present

## 2014-05-01 DIAGNOSIS — L02419 Cutaneous abscess of limb, unspecified: Secondary | ICD-10-CM | POA: Diagnosis not present

## 2014-05-08 ENCOUNTER — Encounter (HOSPITAL_COMMUNITY): Payer: Medicare Other

## 2014-05-09 ENCOUNTER — Ambulatory Visit (HOSPITAL_COMMUNITY): Payer: Medicare Other | Attending: Cardiology | Admitting: *Deleted

## 2014-05-09 DIAGNOSIS — I2510 Atherosclerotic heart disease of native coronary artery without angina pectoris: Secondary | ICD-10-CM | POA: Insufficient documentation

## 2014-05-09 DIAGNOSIS — I10 Essential (primary) hypertension: Secondary | ICD-10-CM | POA: Insufficient documentation

## 2014-05-09 DIAGNOSIS — I714 Abdominal aortic aneurysm, without rupture, unspecified: Secondary | ICD-10-CM | POA: Insufficient documentation

## 2014-05-09 DIAGNOSIS — E785 Hyperlipidemia, unspecified: Secondary | ICD-10-CM | POA: Insufficient documentation

## 2014-05-09 DIAGNOSIS — E119 Type 2 diabetes mellitus without complications: Secondary | ICD-10-CM | POA: Diagnosis not present

## 2014-05-09 DIAGNOSIS — Z87891 Personal history of nicotine dependence: Secondary | ICD-10-CM | POA: Insufficient documentation

## 2014-05-09 NOTE — Progress Notes (Signed)
Aorta Duplex complete

## 2014-05-10 ENCOUNTER — Telehealth: Payer: Self-pay | Admitting: Cardiovascular Disease

## 2014-05-10 NOTE — Telephone Encounter (Signed)
New message     Pt is on an antibiotic and is on coumadin.  He is out with his wife and cannot remember the name of the medication.  Please call him this afternoon when he is home to get the name of the medication and tell him if it will interfere with his coumadin

## 2014-05-10 NOTE — Telephone Encounter (Signed)
Returned call to pt, started taking Bactrim DS BID 8 days ago, has 2 doses left of 10 day course.  Advised pt to hold tonight's dosage of Coumadin and eat something green and leafy tonight for dinner.  Made appt to check INR tomorrow in clinic at 2:45pm.  Pt aware this medication does interact with Coumadin and can increase INR.  Pt aware to monitor for signs and symptoms of bleeding and come in for INR check tomorrow afternoon.  Pt verbalized understanding.

## 2014-05-11 ENCOUNTER — Ambulatory Visit (INDEPENDENT_AMBULATORY_CARE_PROVIDER_SITE_OTHER): Payer: Medicare Other | Admitting: *Deleted

## 2014-05-11 DIAGNOSIS — Z5181 Encounter for therapeutic drug level monitoring: Secondary | ICD-10-CM | POA: Diagnosis not present

## 2014-05-11 DIAGNOSIS — I4891 Unspecified atrial fibrillation: Secondary | ICD-10-CM | POA: Diagnosis not present

## 2014-05-11 DIAGNOSIS — Z7901 Long term (current) use of anticoagulants: Secondary | ICD-10-CM

## 2014-05-11 LAB — POCT INR: INR: 2.8

## 2014-05-15 ENCOUNTER — Encounter: Payer: Self-pay | Admitting: Cardiovascular Disease

## 2014-05-15 NOTE — Telephone Encounter (Signed)
Left message on machine for pt to contact the office.   

## 2014-05-15 NOTE — Telephone Encounter (Signed)
New message ° ° ° ° ° °Returning a nurses call °

## 2014-05-17 NOTE — Telephone Encounter (Signed)
This encounter was created in error - please disregard.

## 2014-05-20 ENCOUNTER — Other Ambulatory Visit: Payer: Self-pay | Admitting: Cardiovascular Disease

## 2014-06-04 ENCOUNTER — Ambulatory Visit (INDEPENDENT_AMBULATORY_CARE_PROVIDER_SITE_OTHER): Payer: Medicare Other | Admitting: *Deleted

## 2014-06-04 DIAGNOSIS — I4891 Unspecified atrial fibrillation: Secondary | ICD-10-CM

## 2014-06-04 DIAGNOSIS — Z7901 Long term (current) use of anticoagulants: Secondary | ICD-10-CM

## 2014-06-04 DIAGNOSIS — Z5181 Encounter for therapeutic drug level monitoring: Secondary | ICD-10-CM | POA: Diagnosis not present

## 2014-06-04 LAB — POCT INR: INR: 1.7

## 2014-06-18 ENCOUNTER — Ambulatory Visit (INDEPENDENT_AMBULATORY_CARE_PROVIDER_SITE_OTHER): Payer: Medicare Other | Admitting: Pharmacist

## 2014-06-18 DIAGNOSIS — Z7901 Long term (current) use of anticoagulants: Secondary | ICD-10-CM | POA: Diagnosis not present

## 2014-06-18 DIAGNOSIS — I4891 Unspecified atrial fibrillation: Secondary | ICD-10-CM | POA: Diagnosis not present

## 2014-06-18 DIAGNOSIS — Z5181 Encounter for therapeutic drug level monitoring: Secondary | ICD-10-CM | POA: Diagnosis not present

## 2014-06-18 LAB — POCT INR: INR: 2

## 2014-07-09 ENCOUNTER — Ambulatory Visit (INDEPENDENT_AMBULATORY_CARE_PROVIDER_SITE_OTHER): Payer: Medicare Other | Admitting: Pharmacist

## 2014-07-09 DIAGNOSIS — Z7901 Long term (current) use of anticoagulants: Secondary | ICD-10-CM | POA: Diagnosis not present

## 2014-07-09 DIAGNOSIS — I4891 Unspecified atrial fibrillation: Secondary | ICD-10-CM

## 2014-07-09 DIAGNOSIS — Z5181 Encounter for therapeutic drug level monitoring: Secondary | ICD-10-CM | POA: Diagnosis not present

## 2014-07-09 LAB — POCT INR: INR: 2.6

## 2014-07-10 DIAGNOSIS — L219 Seborrheic dermatitis, unspecified: Secondary | ICD-10-CM | POA: Diagnosis not present

## 2014-07-10 DIAGNOSIS — C44319 Basal cell carcinoma of skin of other parts of face: Secondary | ICD-10-CM | POA: Diagnosis not present

## 2014-07-10 DIAGNOSIS — C4401 Basal cell carcinoma of skin of lip: Secondary | ICD-10-CM | POA: Diagnosis not present

## 2014-07-10 DIAGNOSIS — L570 Actinic keratosis: Secondary | ICD-10-CM | POA: Diagnosis not present

## 2014-08-01 DIAGNOSIS — L821 Other seborrheic keratosis: Secondary | ICD-10-CM | POA: Diagnosis not present

## 2014-08-01 DIAGNOSIS — L570 Actinic keratosis: Secondary | ICD-10-CM | POA: Diagnosis not present

## 2014-08-01 DIAGNOSIS — L578 Other skin changes due to chronic exposure to nonionizing radiation: Secondary | ICD-10-CM | POA: Diagnosis not present

## 2014-08-09 ENCOUNTER — Ambulatory Visit (INDEPENDENT_AMBULATORY_CARE_PROVIDER_SITE_OTHER): Payer: Medicare Other | Admitting: Podiatrist

## 2014-08-09 ENCOUNTER — Ambulatory Visit (INDEPENDENT_AMBULATORY_CARE_PROVIDER_SITE_OTHER): Payer: Medicare Other

## 2014-08-09 DIAGNOSIS — Z7901 Long term (current) use of anticoagulants: Secondary | ICD-10-CM | POA: Diagnosis not present

## 2014-08-09 DIAGNOSIS — M79676 Pain in unspecified toe(s): Principal | ICD-10-CM

## 2014-08-09 DIAGNOSIS — I4891 Unspecified atrial fibrillation: Secondary | ICD-10-CM | POA: Diagnosis not present

## 2014-08-09 DIAGNOSIS — Z5181 Encounter for therapeutic drug level monitoring: Secondary | ICD-10-CM

## 2014-08-09 DIAGNOSIS — M79609 Pain in unspecified limb: Secondary | ICD-10-CM | POA: Diagnosis not present

## 2014-08-09 DIAGNOSIS — B351 Tinea unguium: Secondary | ICD-10-CM | POA: Diagnosis not present

## 2014-08-09 LAB — POCT INR: INR: 1.7

## 2014-08-09 NOTE — Progress Notes (Signed)
Subjective:  Thomas Marshall presents today for follow up of diabetic foot and nail care. His callus on the right foot is painful and symptomatic  Objective:   Physical Exam  GENERAL APPEARANCE: Alert, conversant. Appropriately groomed. No acute distress.  VASCULAR: Pedal pulses palpable at 1/4 DP and PT bilateral. Capillary refill time is immediate to all digits, Proximal to distal cooling it warm to warm. Digital hair growth is present bilateral  NEUROLOGIC: sensation is intact epicritically and protectively to 5.07 monofilament at 5/5 sites bilateral. Light touch is intact bilateral, vibratory sensation intact bilateral, achilles tendon reflex is intact bilateral.  MUSCULOSKELETAL: Prominent fifth metatarsal laterally right is noted. Left is within acceptable limits. Otherwise rectus foot type is seen. Intrinsic musculature is intact. No Digital deformity is present  DERMATOLOGIC: toenails are elongated, thickened, discolored, dystrophic x10. Hyperkeratotic callus present at the fifth metatarsal base right. Otherwise skin color, texture, and turger are within normal limits. No preulcerative lesions are seen, no interdigital maceration noted. No open lesions present.   Assessment:  Symptomatic mycotic toenails, prominent 5th metatarsal base right from previous ankle injuries and ankle repair  Plan:  Debrided nails and callus with out complication.  He will be seen back prn.

## 2014-08-20 ENCOUNTER — Ambulatory Visit (INDEPENDENT_AMBULATORY_CARE_PROVIDER_SITE_OTHER): Payer: Medicare Other | Admitting: *Deleted

## 2014-08-20 DIAGNOSIS — Z23 Encounter for immunization: Secondary | ICD-10-CM | POA: Diagnosis not present

## 2014-08-27 ENCOUNTER — Ambulatory Visit (INDEPENDENT_AMBULATORY_CARE_PROVIDER_SITE_OTHER): Payer: Medicare Other | Admitting: Surgery

## 2014-08-27 DIAGNOSIS — I4891 Unspecified atrial fibrillation: Secondary | ICD-10-CM | POA: Diagnosis not present

## 2014-08-27 DIAGNOSIS — Z5181 Encounter for therapeutic drug level monitoring: Secondary | ICD-10-CM | POA: Diagnosis not present

## 2014-08-27 DIAGNOSIS — Z7901 Long term (current) use of anticoagulants: Secondary | ICD-10-CM | POA: Diagnosis not present

## 2014-08-27 LAB — POCT INR: INR: 2.1

## 2014-09-10 ENCOUNTER — Ambulatory Visit (INDEPENDENT_AMBULATORY_CARE_PROVIDER_SITE_OTHER): Payer: Medicare Other | Admitting: Family Medicine

## 2014-09-10 ENCOUNTER — Encounter: Payer: Self-pay | Admitting: Family Medicine

## 2014-09-10 ENCOUNTER — Other Ambulatory Visit: Payer: Medicare Other

## 2014-09-10 VITALS — BP 180/72 | HR 64 | Temp 98.0°F | Ht 74.0 in | Wt 223.8 lb

## 2014-09-10 DIAGNOSIS — E119 Type 2 diabetes mellitus without complications: Secondary | ICD-10-CM | POA: Diagnosis not present

## 2014-09-10 DIAGNOSIS — I2510 Atherosclerotic heart disease of native coronary artery without angina pectoris: Secondary | ICD-10-CM

## 2014-09-10 DIAGNOSIS — I4891 Unspecified atrial fibrillation: Secondary | ICD-10-CM

## 2014-09-10 DIAGNOSIS — I10 Essential (primary) hypertension: Secondary | ICD-10-CM | POA: Diagnosis not present

## 2014-09-10 DIAGNOSIS — Z951 Presence of aortocoronary bypass graft: Secondary | ICD-10-CM | POA: Insufficient documentation

## 2014-09-10 DIAGNOSIS — E785 Hyperlipidemia, unspecified: Secondary | ICD-10-CM | POA: Diagnosis not present

## 2014-09-10 DIAGNOSIS — Z23 Encounter for immunization: Secondary | ICD-10-CM

## 2014-09-10 LAB — POCT GLYCOSYLATED HEMOGLOBIN (HGB A1C): Hemoglobin A1C: 6.6

## 2014-09-10 NOTE — Assessment & Plan Note (Signed)
Well-controlled on Lipitor.

## 2014-09-10 NOTE — Assessment & Plan Note (Signed)
Uncontrolled today. However patient has not taken his medication last night or this morning. Advised compliance. Encouraged patient to check regularly at home.

## 2014-09-10 NOTE — Assessment & Plan Note (Signed)
Doing well with no reports of chest pain or dyspnea. Continue current therapy with aspirin, statin and PRN nitroglycerin. Patient not candidate for beta blocker or ACE inhibitor/ARB secondary to bradycardia and angioedema.

## 2014-09-10 NOTE — Assessment & Plan Note (Signed)
Well controlled. A1c 6.6. Continue daily metformin 1000 mg.

## 2014-09-10 NOTE — Progress Notes (Signed)
   Subjective:    Patient ID: Thomas Marshall, male    DOB: [birth year], 73 y.o.   MRN: [id number]  HPI 73 year old male with A. fib on Coumadin, DM 2, CAD status post CABG, hypertension, and hyperlipidemia who presents for follow up.  1) HTN  Disease Monitoring:Home BP Monitoring - Occasional.  Medications/compliance:  clonidine 0.2 mg twice a day, spironolactone 25 mg daily; compliant.  ROS: Denies chest pain, SOB, lightheadedness/dizziness   2) HLD  Well-controlled on statin.  No reported side effects.  3) CAD s/p CABG  No recent chest pain or dyspnea.  Patient currently on aspirin, statin and PRN nitroglycerin.  Followed by cardiology. Patient is not on beta blocker or ACE inhibitor/ARB secondary to bradycardia and angioedema.  4) Atrial Fib  On warfarin; followed by cardiology. Doing well at this time.   Social Hx - Former smoker.  Review of Systems Per HPI    Objective:   Physical Exam Filed Vitals:   09/10/14 0917  BP: 180/72  Pulse: 64  Temp: 98 F (36.7 C)   Exam: General: well appearing, NAD. Cardiovascular: RRR. No murmurs, rubs, or gallops. Respiratory: CTAB. No rales, rhonchi, or wheeze. Abdomen: soft, nontender, nondistended. Extremities: No LE edema.  Diabetic Foot Check -  Appearance - no lesions, ulcers or calluses Skin - no unusual pallor or redness Monofilament testing -  Right - Great toe, medial, central, lateral ball and posterior foot intact Left - Great toe, medial, central, lateral ball and posterior foot intact 2+ dorsalis pedis and posterior tibial pulses.    Assessment & Plan:  See Problem List

## 2014-09-10 NOTE — Assessment & Plan Note (Signed)
Doing well. In normal sinus rhythm currently.

## 2014-09-24 ENCOUNTER — Ambulatory Visit (INDEPENDENT_AMBULATORY_CARE_PROVIDER_SITE_OTHER): Payer: Medicare Other | Admitting: Pharmacist

## 2014-09-24 DIAGNOSIS — Z5181 Encounter for therapeutic drug level monitoring: Secondary | ICD-10-CM | POA: Diagnosis not present

## 2014-09-24 DIAGNOSIS — I4891 Unspecified atrial fibrillation: Secondary | ICD-10-CM

## 2014-09-24 DIAGNOSIS — Z7901 Long term (current) use of anticoagulants: Secondary | ICD-10-CM

## 2014-09-24 LAB — POCT INR: INR: 2.5

## 2014-09-25 DIAGNOSIS — I4891 Unspecified atrial fibrillation: Secondary | ICD-10-CM | POA: Diagnosis not present

## 2014-09-27 ENCOUNTER — Other Ambulatory Visit: Payer: Self-pay

## 2014-09-27 ENCOUNTER — Other Ambulatory Visit: Payer: Self-pay | Admitting: Family Medicine

## 2014-09-27 MED ORDER — DOXYCYCLINE HYCLATE 100 MG PO TABS: 100.0000 mg | ORAL_TABLET | Freq: Two times a day (BID) | ORAL | Status: DC

## 2014-09-27 MED ORDER — SPIRONOLACTONE 25 MG PO TABS: 25.0000 mg | ORAL_TABLET | Freq: Every day | ORAL | Status: DC

## 2014-10-11 ENCOUNTER — Other Ambulatory Visit: Payer: Self-pay | Admitting: *Deleted

## 2014-10-11 ENCOUNTER — Other Ambulatory Visit: Payer: Self-pay | Admitting: Family Medicine

## 2014-10-11 MED ORDER — WARFARIN SODIUM 10 MG PO TABS: 10.0000 mg | ORAL_TABLET | ORAL | Status: DC

## 2014-10-11 MED ORDER — METFORMIN HCL 500 MG PO TABS: 1000.0000 mg | ORAL_TABLET | Freq: Two times a day (BID) | ORAL | Status: DC

## 2014-10-11 NOTE — Telephone Encounter (Signed)
Pt called and needs a refill on his Metformin called in to his NEW pharmacy. [facility] on [address] in [place]. Thomas Marshall

## 2014-10-11 NOTE — Telephone Encounter (Signed)
Spoke with patient and informed him that below rx has been sent in by Dr. Lacinda Axon

## 2014-10-23 ENCOUNTER — Ambulatory Visit (INDEPENDENT_AMBULATORY_CARE_PROVIDER_SITE_OTHER): Payer: Medicare Other | Admitting: Cardiovascular Disease

## 2014-10-23 ENCOUNTER — Ambulatory Visit (INDEPENDENT_AMBULATORY_CARE_PROVIDER_SITE_OTHER): Payer: Medicare Other | Admitting: *Deleted

## 2014-10-23 ENCOUNTER — Encounter: Payer: Self-pay | Admitting: Cardiovascular Disease

## 2014-10-23 VITALS — BP 118/64 | HR 48 | Ht 74.0 in | Wt 220.0 lb

## 2014-10-23 DIAGNOSIS — I10 Essential (primary) hypertension: Secondary | ICD-10-CM

## 2014-10-23 DIAGNOSIS — I4891 Unspecified atrial fibrillation: Secondary | ICD-10-CM

## 2014-10-23 DIAGNOSIS — Z7901 Long term (current) use of anticoagulants: Secondary | ICD-10-CM

## 2014-10-23 DIAGNOSIS — I2510 Atherosclerotic heart disease of native coronary artery without angina pectoris: Secondary | ICD-10-CM | POA: Diagnosis not present

## 2014-10-23 DIAGNOSIS — Z5181 Encounter for therapeutic drug level monitoring: Secondary | ICD-10-CM

## 2014-10-23 LAB — POCT INR: INR: 1.9

## 2014-10-23 MED ORDER — CLONIDINE HCL 0.1 MG PO TABS: 0.1000 mg | ORAL_TABLET | Freq: Two times a day (BID) | ORAL | Status: DC

## 2014-10-23 NOTE — Progress Notes (Signed)
Background: The patient has coronary artery disease status post four-vessel CABG in 2004. Other chronic cardiac problems include hypertension, hyperlipidemia, and paroxysmal atrial fibrillation treated with amiodarone and anticoagulated with warfarin. The patient also has a small abdominal aortic aneurysm measuring 3.2 cm in maximal diameter from a duplex study in 2014.  HPI:  73 year old gentleman present for follow-up evaluation. The patient complains of progressive fatigue. He has no specific cardiac related complaints and denies chest pain, chest pressure, edema, or heart palpitations. He does however complain of shortness of breath with activity but not at rest. He denies orthopnea, PND, or leg swelling. He's had no cough, fevers, or chills.  Studies:  Outside study: Myocardial perfusion study 08/16/2013: Normal study with normal perfusion and normal LVEF.  Lipid Panel     Component Value Date/Time   CHOL 119 04/03/2014 1427   TRIG 159* 04/03/2014 1427   HDL 31* 04/03/2014 1427   CHOLHDL 3.8 04/03/2014 1427   VLDL 32 04/03/2014 1427   LDLCALC 56 04/03/2014 1427     Outpatient Encounter Prescriptions as of 10/23/2014  Medication Sig  . aspirin 81 MG tablet Take 81 mg by mouth daily.  [name] atorvastatin (LIPITOR) 40 MG tablet Take 40 mg by mouth daily.  . cholecalciferol (VITAMIN D) 1000 UNITS tablet Take 1,000 Units by mouth daily.  . cloNIDine (CATAPRES) 0.2 MG tablet TAKE ONE TABLET BY MOUTH TWICE DAILY  . metFORMIN (GLUCOPHAGE) 1000 MG tablet Take 1,000 mg by mouth daily after supper.  . nitroGLYCERIN (NITROSTAT) 0.4 MG SL tablet Place 1 tablet (0.4 mg total) under the tongue every 5 (five) minutes as needed for chest pain.  [name] omeprazole (PRILOSEC) 20 MG capsule Take 20 mg by mouth daily.  [name] spironolactone (ALDACTONE) 25 MG tablet Take 1 tablet (25 mg total) by mouth daily.  . vitamin C (ASCORBIC ACID) 500 MG tablet Take 500 mg by mouth daily.  . vitamin E 400 UNIT capsule Take  400 Units by mouth daily.  [name] warfarin (COUMADIN) 10 MG tablet Take 1 tablet (10 mg total) by mouth as directed.  . warfarin (COUMADIN) 5 MG tablet TAKE AS DIRECTED BY COUMADIN CLINIC  . zoster vaccine live, PF, (ZOSTAVAX) 16109 UNT/0.65ML injection Inject 19,400 Units into the skin once.  . [DISCONTINUED] doxycycline (VIBRA-TABS) 100 MG tablet Take 1 tablet (100 mg total) by mouth 2 (two) times daily. (Patient not taking: Reported on 10/23/2014)  . [DISCONTINUED] metFORMIN (GLUCOPHAGE) 500 MG tablet Take 2 tablets (1,000 mg total) by mouth 2 (two) times daily with a meal. (Patient not taking: Reported on 10/23/2014)    Allergies  Allergen Reactions  . Carvedilol     Low heart rate   . Lisinopril Swelling    Angioedema.   . Losartan Other (See Comments)    Angioedema   . Nifedipine Swelling  . Triamterene Swelling  . Amoxicillin Rash  . Ampicillin Rash    Past Medical History  Diagnosis Date  . Cancer     skin & squamous cell  . Diabetes mellitus without complication   . GERD (gastroesophageal reflux disease)   . Hyperlipidemia   . Hypertension   . Coronary artery disease   . PAF (paroxysmal atrial fibrillation)     family history includes Cancer in his sister; Diabetes in his brother and sister; Heart disease in his father; Stroke in his mother. There is no history of Early death.   ROS: Negative except as per HPI  BP 118/64 mmHg  Pulse 48  Ht 6\' 2"  (1.88 m)  Wt 220 lb (99.791 kg)  BMI 28.23 kg/m2  PHYSICAL EXAM: Pt is alert and oriented, NAD HEENT: normal Neck: JVP - normal, carotids 2+= with bilateral bruits Lungs: CTA bilaterally CV: Bradycardic and regular without murmur or gallop Abd: soft, NT, Positive BS, no hepatomegaly Ext: no C/C/E, distal pulses intact and equal Skin: warm/dry no rash  EKG:  Marked sinus bradycardia 48 bpm, otherwise within normal limits.  ASSESSMENT AND PLAN: 1. Coronary artery disease, native vessel, without symptoms of angina.  Patient appears stable from a cardiac perspective. He is now 11 years out from CABG with his most recent stress test approximately one year ago demonstrating no ischemia. Will continue with his medical program. I will see him back in 6 months.  2. Asymptomatic carotid stenosis. I reviewed a carotid duplex scan from the [facility] dated 07/12/2014. The patient has 50-69% right internal carotid artery stenosis. Recommend a repeat duplex scan in approximately one year from his previous. I will see him back after his duplex is completed.  3. Essential hypertension. The patient has fatigue. He may have symptoms related to clonidine. He is also noted to be bradycardic. I recommended that he decrease his clonidine to 0.1 mg twice daily.  4. Paroxysmal atrial fibrillation. Patient continues on warfarin without bleeding complications.  For follow-up I will see him back in 6 months after his carotid duplex scan is completed. He was advised to monitor his blood pressure at home since we are reducing his clonidine dose.  [name], MD 10/23/2014 10:27 AM

## 2014-10-23 NOTE — Patient Instructions (Signed)
Your physician has recommended you make the following change in your medication:  DECREASE Clonidine to 0.1mg  take one by mouth twice a day  Your physician has requested that you have a carotid duplex in AUGUST 2016. This test is an ultrasound of the carotid arteries in your neck. It looks at blood flow through these arteries that supply the brain with blood. Allow one hour for this exam. There are no restrictions or special instructions.  Your physician wants you to follow-up in: August 2016 with Dr Burt Knack.  You will receive a reminder letter in the mail two months in advance. If you don't receive a letter, please call our office to schedule the follow-up appointment.

## 2014-10-24 ENCOUNTER — Encounter: Payer: Self-pay | Admitting: Cardiovascular Disease

## 2014-10-31 DIAGNOSIS — L821 Other seborrheic keratosis: Secondary | ICD-10-CM | POA: Diagnosis not present

## 2014-10-31 DIAGNOSIS — L219 Seborrheic dermatitis, unspecified: Secondary | ICD-10-CM | POA: Diagnosis not present

## 2014-10-31 DIAGNOSIS — L570 Actinic keratosis: Secondary | ICD-10-CM | POA: Diagnosis not present

## 2014-11-01 ENCOUNTER — Telehealth: Payer: Self-pay | Admitting: *Deleted

## 2014-11-01 NOTE — Telephone Encounter (Signed)
Spoke with pt and he states has been ordered Cipro 500mg  bid for 7 days for rash around ear and will start tonight. Informed that there could be interaction between Coumadin and Cipro so instructed to return for INR check on Monday December 7th and he states understanding.

## 2014-11-05 ENCOUNTER — Ambulatory Visit (INDEPENDENT_AMBULATORY_CARE_PROVIDER_SITE_OTHER): Payer: Medicare Other | Admitting: Pharmacist

## 2014-11-05 DIAGNOSIS — Z7901 Long term (current) use of anticoagulants: Secondary | ICD-10-CM

## 2014-11-05 DIAGNOSIS — Z5181 Encounter for therapeutic drug level monitoring: Secondary | ICD-10-CM | POA: Diagnosis not present

## 2014-11-05 DIAGNOSIS — I4891 Unspecified atrial fibrillation: Secondary | ICD-10-CM | POA: Diagnosis not present

## 2014-11-05 LAB — POCT INR: INR: 2.4

## 2014-11-29 ENCOUNTER — Ambulatory Visit: Payer: Medicare Other | Admitting: Podiatrist

## 2014-12-03 ENCOUNTER — Ambulatory Visit (INDEPENDENT_AMBULATORY_CARE_PROVIDER_SITE_OTHER): Payer: Medicare Other | Admitting: *Deleted

## 2014-12-03 DIAGNOSIS — Z7901 Long term (current) use of anticoagulants: Secondary | ICD-10-CM | POA: Diagnosis not present

## 2014-12-03 DIAGNOSIS — I4891 Unspecified atrial fibrillation: Secondary | ICD-10-CM | POA: Diagnosis not present

## 2014-12-03 DIAGNOSIS — Z5181 Encounter for therapeutic drug level monitoring: Secondary | ICD-10-CM | POA: Diagnosis not present

## 2014-12-03 LAB — POCT INR: INR: 1.8

## 2014-12-12 ENCOUNTER — Encounter: Payer: Self-pay | Admitting: Family Medicine

## 2015-01-02 ENCOUNTER — Ambulatory Visit (INDEPENDENT_AMBULATORY_CARE_PROVIDER_SITE_OTHER): Payer: Medicare Other | Admitting: *Deleted

## 2015-01-02 DIAGNOSIS — I4891 Unspecified atrial fibrillation: Secondary | ICD-10-CM

## 2015-01-02 DIAGNOSIS — Z7901 Long term (current) use of anticoagulants: Secondary | ICD-10-CM | POA: Diagnosis not present

## 2015-01-02 DIAGNOSIS — Z5181 Encounter for therapeutic drug level monitoring: Secondary | ICD-10-CM

## 2015-01-02 LAB — POCT INR: INR: 1.6

## 2015-01-09 ENCOUNTER — Telehealth: Payer: Self-pay

## 2015-01-09 ENCOUNTER — Other Ambulatory Visit: Payer: Self-pay | Admitting: Surgery

## 2015-01-09 MED ORDER — WARFARIN SODIUM 10 MG PO TABS: 10.0000 mg | ORAL_TABLET | ORAL | Status: DC

## 2015-01-10 ENCOUNTER — Encounter: Payer: Self-pay | Admitting: *Deleted

## 2015-01-10 NOTE — Telephone Encounter (Signed)
This encounter was created in error - please disregard.

## 2015-01-15 ENCOUNTER — Ambulatory Visit (INDEPENDENT_AMBULATORY_CARE_PROVIDER_SITE_OTHER): Payer: Medicare Other | Admitting: Family Medicine

## 2015-01-15 ENCOUNTER — Encounter: Payer: Self-pay | Admitting: Family Medicine

## 2015-01-15 VITALS — BP 161/63 | HR 58 | Temp 98.7°F | Wt 227.6 lb

## 2015-01-15 DIAGNOSIS — J00 Acute nasopharyngitis [common cold]: Secondary | ICD-10-CM | POA: Diagnosis not present

## 2015-01-15 DIAGNOSIS — J209 Acute bronchitis, unspecified: Secondary | ICD-10-CM | POA: Insufficient documentation

## 2015-01-15 MED ORDER — AZITHROMYCIN 250 MG PO TABS: ORAL_TABLET | ORAL | Status: DC

## 2015-01-15 NOTE — Progress Notes (Signed)
   Subjective:    Patient ID: Thomas Marshall, male    DOB: 11-May-1941, 74 y.o.   MRN: [id number]  HPI 74 year old male with CAD, atrial fibrillation, hypertension, hyperlipidemia, and DM 2 presents for same day appointment with complaints of cough.  1) Cough  X 3 weeks.  Cough is productive (white phlegm).  He has tried some over-the-counter cough suppressants with some relief.   No associated SOB, fever, chills.  He does note some associated rhinorrhea.   No other reported symptoms.  Review of Systems Per HPI    Objective:   Physical Exam Filed Vitals:   01/15/15 1342  BP: 161/63  Pulse: 58  Temp: 98.7 F (37.1 C)   Exam: General: well appearing, NAD. HEENT: NCAT. Oropharynx clear. Normal TMs bilaterally. Cardiovascular: RRR. 2/6 systolic murmur. Respiratory: CTAB. No rales, rhonchi, or wheeze. Abdomen: soft, nontender, nondistended. Extremities: No LE edema.  Assessment & Plan:  See Problem List

## 2015-01-15 NOTE — Patient Instructions (Signed)
It was nice to see you today.  Take the prescription as indicated.  Follow up annually or sooner if needed.   Take care  Dr. Lacinda Axon

## 2015-01-15 NOTE — Assessment & Plan Note (Signed)
Given duration of symptoms, will treat empirically with azithromycin.

## 2015-01-16 ENCOUNTER — Ambulatory Visit (INDEPENDENT_AMBULATORY_CARE_PROVIDER_SITE_OTHER): Payer: Medicare Other | Admitting: *Deleted

## 2015-01-16 DIAGNOSIS — Z7901 Long term (current) use of anticoagulants: Secondary | ICD-10-CM | POA: Diagnosis not present

## 2015-01-16 DIAGNOSIS — Z5181 Encounter for therapeutic drug level monitoring: Secondary | ICD-10-CM | POA: Diagnosis not present

## 2015-01-16 DIAGNOSIS — I4891 Unspecified atrial fibrillation: Secondary | ICD-10-CM | POA: Diagnosis not present

## 2015-01-16 LAB — POCT INR: INR: 1.7

## 2015-01-18 NOTE — Telephone Encounter (Signed)
eerror

## 2015-01-30 DIAGNOSIS — L821 Other seborrheic keratosis: Secondary | ICD-10-CM | POA: Diagnosis not present

## 2015-01-30 DIAGNOSIS — L570 Actinic keratosis: Secondary | ICD-10-CM | POA: Diagnosis not present

## 2015-01-30 DIAGNOSIS — L578 Other skin changes due to chronic exposure to nonionizing radiation: Secondary | ICD-10-CM | POA: Diagnosis not present

## 2015-01-31 ENCOUNTER — Ambulatory Visit (INDEPENDENT_AMBULATORY_CARE_PROVIDER_SITE_OTHER): Payer: Medicare Other | Admitting: *Deleted

## 2015-01-31 DIAGNOSIS — Z7901 Long term (current) use of anticoagulants: Secondary | ICD-10-CM

## 2015-01-31 DIAGNOSIS — I4891 Unspecified atrial fibrillation: Secondary | ICD-10-CM

## 2015-01-31 DIAGNOSIS — Z5181 Encounter for therapeutic drug level monitoring: Secondary | ICD-10-CM

## 2015-01-31 LAB — POCT INR: INR: 2.1

## 2015-02-21 ENCOUNTER — Ambulatory Visit (INDEPENDENT_AMBULATORY_CARE_PROVIDER_SITE_OTHER): Payer: Medicare Other | Admitting: *Deleted

## 2015-02-21 DIAGNOSIS — Z7901 Long term (current) use of anticoagulants: Secondary | ICD-10-CM | POA: Diagnosis not present

## 2015-02-21 DIAGNOSIS — Z5181 Encounter for therapeutic drug level monitoring: Secondary | ICD-10-CM

## 2015-02-21 DIAGNOSIS — I4891 Unspecified atrial fibrillation: Secondary | ICD-10-CM | POA: Diagnosis not present

## 2015-02-21 LAB — POCT INR: INR: 2.4

## 2015-03-11 DIAGNOSIS — E1142 Type 2 diabetes mellitus with diabetic polyneuropathy: Secondary | ICD-10-CM | POA: Diagnosis not present

## 2015-03-11 DIAGNOSIS — E119 Type 2 diabetes mellitus without complications: Secondary | ICD-10-CM | POA: Diagnosis not present

## 2015-03-21 ENCOUNTER — Ambulatory Visit (INDEPENDENT_AMBULATORY_CARE_PROVIDER_SITE_OTHER): Payer: Medicare Other

## 2015-03-21 DIAGNOSIS — I4891 Unspecified atrial fibrillation: Secondary | ICD-10-CM | POA: Diagnosis not present

## 2015-03-21 DIAGNOSIS — Z7901 Long term (current) use of anticoagulants: Secondary | ICD-10-CM

## 2015-03-21 DIAGNOSIS — Z5181 Encounter for therapeutic drug level monitoring: Secondary | ICD-10-CM

## 2015-03-21 LAB — POCT INR: INR: 2.6

## 2015-03-25 ENCOUNTER — Other Ambulatory Visit: Payer: Self-pay | Admitting: Cardiology

## 2015-03-25 DIAGNOSIS — I6523 Occlusion and stenosis of bilateral carotid arteries: Secondary | ICD-10-CM

## 2015-04-04 ENCOUNTER — Telehealth: Payer: Self-pay | Admitting: Family Medicine

## 2015-04-04 NOTE — Telephone Encounter (Signed)
Pt called because we have turned down his orders for diabetic shoes since he has not been seen since 10/15. The problems is he was seen on 01/15/15 and [facility] needs the notes from his last visit so that they can give him his diabetic shoes. Please fax this to [phone] so that he can get the shoes. jw

## 2015-04-04 NOTE — Telephone Encounter (Signed)
LMOVM for pt to return call.  There are 2 issues:  1. He was ONLY seen for an acute issue on 01/15/15  2.  We would need a signed ROI for [place] to fax ANY notes to [facility]. Camran Keady, Salome Spotted, CMA

## 2015-04-09 ENCOUNTER — Ambulatory Visit (HOSPITAL_COMMUNITY): Payer: Medicare Other | Attending: Cardiovascular Disease

## 2015-04-09 ENCOUNTER — Encounter (HOSPITAL_COMMUNITY): Payer: Medicare Other

## 2015-04-09 ENCOUNTER — Ambulatory Visit (INDEPENDENT_AMBULATORY_CARE_PROVIDER_SITE_OTHER): Payer: Medicare Other | Admitting: Cardiology

## 2015-04-09 ENCOUNTER — Encounter: Payer: Self-pay | Admitting: Cardiology

## 2015-04-09 VITALS — BP 160/80 | HR 55 | Ht 74.0 in | Wt 228.8 lb

## 2015-04-09 DIAGNOSIS — I714 Abdominal aortic aneurysm, without rupture, unspecified: Secondary | ICD-10-CM | POA: Insufficient documentation

## 2015-04-09 DIAGNOSIS — I10 Essential (primary) hypertension: Secondary | ICD-10-CM

## 2015-04-09 DIAGNOSIS — I4891 Unspecified atrial fibrillation: Secondary | ICD-10-CM

## 2015-04-09 DIAGNOSIS — E1122 Type 2 diabetes mellitus with diabetic chronic kidney disease: Secondary | ICD-10-CM

## 2015-04-09 DIAGNOSIS — I6523 Occlusion and stenosis of bilateral carotid arteries: Secondary | ICD-10-CM | POA: Diagnosis not present

## 2015-04-09 DIAGNOSIS — I739 Peripheral vascular disease, unspecified: Secondary | ICD-10-CM

## 2015-04-09 DIAGNOSIS — Z7901 Long term (current) use of anticoagulants: Secondary | ICD-10-CM

## 2015-04-09 DIAGNOSIS — Z951 Presence of aortocoronary bypass graft: Secondary | ICD-10-CM | POA: Diagnosis not present

## 2015-04-09 DIAGNOSIS — R001 Bradycardia, unspecified: Secondary | ICD-10-CM

## 2015-04-09 DIAGNOSIS — E785 Hyperlipidemia, unspecified: Secondary | ICD-10-CM

## 2015-04-09 DIAGNOSIS — N189 Chronic kidney disease, unspecified: Secondary | ICD-10-CM

## 2015-04-09 NOTE — Assessment & Plan Note (Signed)
Chronic renal insufficiency stage 2

## 2015-04-09 NOTE — Assessment & Plan Note (Signed)
Controlled.  

## 2015-04-09 NOTE — Progress Notes (Signed)
04/09/2015 Thomas Marshall   [birth year]  [id number]  Primary Physician Thersa Salt, DO Primary Cardiologist: Dr Burt Knack  HPI:  74 year-old gentleman presented in Oct 2014 to establish care. He has CAD s/p 4 vessel CABG in 2004 in [place], [place]. He had a low risk Myoview in [facility] in 2014.  Other cardiac problems include HTN, hyperlipidemia, and paroxysmal atrial fibrillation treated previously with a strategy of rhythm control (amiodarone) and anticoagulation (warfarin), he says he has been off Amiodarone for at least year without recurrent AF. He does have baseline bradycardia but this appears to be asymptomatic. He remains on Coumadin.                He is in the office today for follow up. He says he recently had a diabetic eye exam at the [place] and was then told he needed carotid dopplers. These showed a 60% RICA stenosis. [facility] and wanted it done here. He had carotid dopplers today, results are pending. He denied any history of stroke or TIA. He has not had angina or unusual dyspnea. He knows his HR is slow but he has not had near syncope or syncope, nor has he had tachycardia. He is concerned because his mother had a stroke and his brother had a carotid endarterectomy.     Current Outpatient Prescriptions  Medication Sig Dispense Refill  . aspirin 81 MG tablet Take 81 mg by mouth daily.    [name] atorvastatin (LIPITOR) 40 MG tablet Take 40 mg by mouth daily.    . cholecalciferol (VITAMIN D) 1000 UNITS tablet Take 1,000 Units by mouth daily.    . cloNIDine (CATAPRES) 0.1 MG tablet Take 1 tablet (0.1 mg total) by mouth 2 (two) times daily. 180 tablet 3  . metFORMIN (GLUCOPHAGE) 1000 MG tablet Take 1,000 mg by mouth daily after supper.    . nitroGLYCERIN (NITROSTAT) 0.4 MG SL tablet Place 1 tablet (0.4 mg total) under the tongue every 5 (five) minutes as needed for chest pain. 25 tablet 3  . omeprazole (PRILOSEC) 20 MG capsule Take 20 mg by  mouth daily.    [name] spironolactone (ALDACTONE) 25 MG tablet Take 1 tablet (25 mg total) by mouth daily. 90 tablet 3  . vitamin C (ASCORBIC ACID) 500 MG tablet Take 500 mg by mouth daily.    . vitamin E 400 UNIT capsule Take 400 Units by mouth daily.    [name] warfarin (COUMADIN) 10 MG tablet Take 1 tablet (10 mg total) by mouth as directed. 30 tablet 3  . zoster vaccine live, PF, (ZOSTAVAX) 29562 UNT/0.65ML injection Inject 19,400 Units into the skin once. 1 each 0   No current facility-administered medications for this visit.    Allergies  Allergen Reactions  . Carvedilol     Low heart rate   . Lisinopril Swelling    Angioedema.   . Losartan Other (See Comments)    Angioedema   . Nifedipine Swelling  . Triamterene Swelling  . Amoxicillin Rash  . Ampicillin Rash    History   Social History  . Marital Status: Married    Spouse Name: N/A  . Number of Children: N/A  . Years of Education: N/A   Occupational History  . Not on file.   Social History Main Topics  . Smoking status: Former Smoker -- 1.00 packs/day for 13 years    Quit date: 11/30/1970  . Smokeless tobacco: Never Used  .  Alcohol Use: No  . Drug Use: No  . Sexual Activity: Yes   Other Topics Concern  . Not on file   Social History Narrative     Review of Systems: General: negative for chills, fever, night sweats or weight changes.  Cardiovascular: negative for chest pain, dyspnea on exertion, edema, orthopnea, palpitations, paroxysmal nocturnal dyspnea or shortness of breath Dermatological: negative for rash Respiratory: negative for cough or wheezing Urologic: negative for hematuria Abdominal: negative for nausea, vomiting, diarrhea, bright red blood per rectum, melena, or hematemesis Neurologic: negative for visual changes, syncope, or dizziness No claudication All other systems reviewed and are otherwise negative except as noted above.    Blood pressure 160/80, pulse 55, height 6\' 2"  (1.88 m), weight  228 lb 12.8 oz (103.783 kg).  General appearance: alert, cooperative and no distress Neck: no JVD and LCA bruit Lungs: clear to auscultation bilaterally Heart: regular rate and rhythm and soft systolic murmur at AOV and LSB, slow rate Abdomen: soft, non-tender; bowel sounds normal; no masses,  no organomegaly Extremities: extremities normal, atraumatic, no cyanosis or edema Pulses: diminnished in LE, no FA bruit noted Skin: cool and dry Neurologic: Grossly normal  EKG NSR, SB 53, incomplete RBBB  ASSESSMENT AND PLAN:   S/P CABG x 4 2004 Low risk Myoview in [facility] 2014   Atrial fibrillation Post CABG without recurrence. He was on Amiodarone for a time but has not been on this recently.    Long term current use of anticoagulant therapy He has been on Coumadin post CABG   Type 2 diabetes mellitus with renal manifestations Chronic renal insufficiency stage 2   Sinus bradycardia Asymptomatic   Hypertension Controlled   Hyperlipidemia Treated   PVD (peripheral vascular disease) He recently had carotid dopplers at the [facility] after an eye exam. He declined further work up at the [place] and had dopplers here today.   AAA (abdominal aortic aneurysm) Due for f/u in June 2016.    PLAN  Its not clear what was seen on his eye exam to trigger a work up for carotid disease. We will try and get those records from the [place]. He is currently asymptomatic from his CAD and bradycardia. We'll f/u his carotid dopplers and arrange follow up as needed (? Carotid stent candidate if needed). Otherwise he can f/u with Dr Burt Knack in a year or sooner if he develops symptoms.   Nitisha Civello KPA-C 04/09/2015 1:26 PM

## 2015-04-09 NOTE — Assessment & Plan Note (Signed)
Due for f/u in June 2016.

## 2015-04-09 NOTE — Assessment & Plan Note (Signed)
He recently had carotid dopplers at the [facility] after an eye exam. He declined further work up at the [place] and had dopplers here today.

## 2015-04-09 NOTE — Assessment & Plan Note (Signed)
Treated

## 2015-04-09 NOTE — Assessment & Plan Note (Signed)
Asymptomatic. 

## 2015-04-09 NOTE — Assessment & Plan Note (Signed)
Post CABG without recurrence. He was on Amiodarone for a time but has not been on this recently.

## 2015-04-09 NOTE — Patient Instructions (Signed)
.  Medication Instructions:   Your physician recommends that you continue on your current medications as directed. Please refer to the Current Medication list given to you today.  Labwork:   Testing/Procedures:   Follow-Up:  Your physician wants you to follow-up in:  IN [place] will receive a reminder letter in the mail two months in advance. If you don't receive a letter, please call our office to schedule the follow-up appointment.   Any Other Special Instructions Will Be Listed Below (If Applicable).

## 2015-04-09 NOTE — Assessment & Plan Note (Signed)
Low risk Myoview in [facility] 2014

## 2015-04-09 NOTE — Assessment & Plan Note (Signed)
He has been on Coumadin post CABG

## 2015-04-15 ENCOUNTER — Telehealth: Payer: Self-pay | Admitting: Cardiology

## 2015-04-15 NOTE — Telephone Encounter (Signed)
ROI sent to [facility] records rec today via mial gave to chart prep 5.16.16/km

## 2015-04-17 ENCOUNTER — Other Ambulatory Visit: Payer: Self-pay | Admitting: *Deleted

## 2015-04-17 ENCOUNTER — Telehealth: Payer: Self-pay | Admitting: Cardiovascular Disease

## 2015-04-17 DIAGNOSIS — I6523 Occlusion and stenosis of bilateral carotid arteries: Secondary | ICD-10-CM

## 2015-04-17 NOTE — Telephone Encounter (Signed)
Spoke w/Dana at [facility] and will schedule him within next 2 weeks for consult.  Order placed.  Notified pt that he should be called today with an appointment. He verbalizes understanding.

## 2015-04-17 NOTE — Telephone Encounter (Signed)
New problem   Pt need to speak to nurse concerning his abnormal [place].

## 2015-04-24 DIAGNOSIS — L821 Other seborrheic keratosis: Secondary | ICD-10-CM | POA: Diagnosis not present

## 2015-04-24 DIAGNOSIS — L578 Other skin changes due to chronic exposure to nonionizing radiation: Secondary | ICD-10-CM | POA: Diagnosis not present

## 2015-04-24 DIAGNOSIS — L570 Actinic keratosis: Secondary | ICD-10-CM | POA: Diagnosis not present

## 2015-04-26 ENCOUNTER — Encounter: Payer: Self-pay | Admitting: Vascular Surgery

## 2015-05-01 ENCOUNTER — Ambulatory Visit (INDEPENDENT_AMBULATORY_CARE_PROVIDER_SITE_OTHER): Payer: Medicare Other | Admitting: *Deleted

## 2015-05-01 ENCOUNTER — Encounter: Payer: Self-pay | Admitting: Vascular Surgery

## 2015-05-01 ENCOUNTER — Ambulatory Visit (INDEPENDENT_AMBULATORY_CARE_PROVIDER_SITE_OTHER): Payer: Medicare Other | Admitting: Vascular Surgery

## 2015-05-01 VITALS — BP 146/78 | HR 55 | Resp 16 | Ht 74.0 in | Wt 225.0 lb

## 2015-05-01 DIAGNOSIS — I4891 Unspecified atrial fibrillation: Secondary | ICD-10-CM | POA: Diagnosis not present

## 2015-05-01 DIAGNOSIS — Z5181 Encounter for therapeutic drug level monitoring: Secondary | ICD-10-CM | POA: Diagnosis not present

## 2015-05-01 DIAGNOSIS — Z7901 Long term (current) use of anticoagulants: Secondary | ICD-10-CM | POA: Diagnosis not present

## 2015-05-01 DIAGNOSIS — I714 Abdominal aortic aneurysm, without rupture, unspecified: Secondary | ICD-10-CM

## 2015-05-01 DIAGNOSIS — I6523 Occlusion and stenosis of bilateral carotid arteries: Secondary | ICD-10-CM | POA: Diagnosis not present

## 2015-05-01 DIAGNOSIS — I6529 Occlusion and stenosis of unspecified carotid artery: Secondary | ICD-10-CM | POA: Insufficient documentation

## 2015-05-01 LAB — POCT INR: INR: 2.8

## 2015-05-01 NOTE — Progress Notes (Signed)
New Carotid Patient  Referred by:  Coral Spikes, DO [address], [place] [address]  Reason for referral: B carotid stenosis  History of Present Illness  Thomas Marshall is a 74 y.o. (August 19, 1941) male who presents with chief complaint: routine surveillance.  This [facility] was sent for carotid studies recently after routine eye appointment.  Previous carotid studies demonstrated: RICA [id number] stenosis, LICA A999333 stenosis.  Patient has no history of TIA or stroke symptom.  The patient has never had amaurosis fugax or monocular blindness.  The patient has never had facial drooping or hemiplegia.  The patient has never had receptive or expressive aphasia.   The patient's risks factors for carotid disease include: DM, HTN, HLD, and former smoker.  Pt also has known small AAA only 3.2 cm on abd ultrasound 2 years ago.  He is followed in the [place] for this.  He has been told previously he has PAD though he denies rest pain or intermittent claudication.  He has had sensation of posterior knee swelling with ambulation since a L TKR.  Past Medical History  Diagnosis Date  . Cancer     skin & squamous cell  . Diabetes mellitus without complication   . GERD (gastroesophageal reflux disease)   . Hyperlipidemia   . Hypertension   . Coronary artery disease   . PAF (paroxysmal atrial fibrillation)     Past Surgical History  Procedure Laterality Date  . Exploration post operative open heart    . Urinary surgery      scar tissue  . Ankle surgery      right fused  . Knee surgery      replacement on both knees  . Back surgery      low back   . Finger surgery      skin graft on rt index  . Coronary artery bypass graft  2004    [place]  . Tonsillectomy    . Colonoscopy    . Hernia repair Right     RIH  . Cholecystectomy N/A 12/07/2013    Procedure: LAPAROSCOPIC CHOLECYSTECTOMY ;  Surgeon: [name], MD;  Location: [facility];  Service: General;  Laterality: N/A;     History   Social History  . Marital Status: Married    Spouse Name: N/A  . Number of Children: N/A  . Years of Education: N/A   Occupational History  . Not on file.   Social History Main Topics  . Smoking status: Former Smoker -- 1.00 packs/day for 13 years    Quit date: 11/30/1970  . Smokeless tobacco: Never Used  . Alcohol Use: No  . Drug Use: No  . Sexual Activity: Yes   Other Topics Concern  . Not on file   Social History Narrative    Family History  Problem Relation Age of Onset  . Stroke Mother   . Heart disease Mother   . Heart disease Father   . Hypertension Father   . Hyperlipidemia Father   . Cancer Sister     breast  and skin  . Diabetes Sister   . Hypertension Sister   . Diabetes Brother   . Heart disease Brother   . Hyperlipidemia Brother   . Hypertension Brother   . Early death Neg Hx     Current Outpatient Prescriptions on File Prior to Visit  Medication Sig Dispense Refill  . aspirin 81 MG tablet Take 81 mg by mouth daily.    [name]  atorvastatin (LIPITOR) 40 MG tablet Take 40 mg by mouth daily.    . cholecalciferol (VITAMIN D) 1000 UNITS tablet Take 1,000 Units by mouth daily.    . cloNIDine (CATAPRES) 0.1 MG tablet Take 1 tablet (0.1 mg total) by mouth 2 (two) times daily. 180 tablet 3  . metFORMIN (GLUCOPHAGE) 1000 MG tablet Take 1,000 mg by mouth daily after supper.    . nitroGLYCERIN (NITROSTAT) 0.4 MG SL tablet Place 1 tablet (0.4 mg total) under the tongue every 5 (five) minutes as needed for chest pain. 25 tablet 3  . omeprazole (PRILOSEC) 20 MG capsule Take 20 mg by mouth daily.    [name] spironolactone (ALDACTONE) 25 MG tablet Take 1 tablet (25 mg total) by mouth daily. 90 tablet 3  . vitamin C (ASCORBIC ACID) 500 MG tablet Take 500 mg by mouth daily.    . vitamin E 400 UNIT capsule Take 400 Units by mouth daily.    [name] warfarin (COUMADIN) 10 MG tablet Take 1 tablet (10 mg total) by mouth as directed. 30 tablet 3  . zoster vaccine live, PF,  (ZOSTAVAX) 16109 UNT/0.65ML injection Inject 19,400 Units into the skin once. 1 each 0   No current facility-administered medications on file prior to visit.    Allergies  Allergen Reactions  . Carvedilol     Low heart rate   . Lisinopril Swelling    Angioedema.   . Losartan Other (See Comments)    Angioedema   . Nifedipine Swelling  . Triamterene Swelling  . Amoxicillin Rash  . Ampicillin Rash    REVIEW OF SYSTEMS:  (Positives checked otherwise negative)  CARDIOVASCULAR:  []  chest pain, []  chest pressure, []  palpitations, []  shortness of breath when laying flat, []  shortness of breath with exertion,  []  pain in feet when walking, []  pain in feet when laying flat, []  history of blood clot in veins (DVT), []  history of phlebitis, []  swelling in legs, []  varicose veins  PULMONARY:  []  productive cough, []  asthma, []  wheezing  NEUROLOGIC:  []  weakness in arms or legs, []  numbness in arms or legs, []  difficulty speaking or slurred speech, []  temporary loss of vision in one eye, []  dizziness  HEMATOLOGIC:  []  bleeding problems, []  problems with blood clotting too easily  MUSCULOSKEL:  []  joint pain, []  joint swelling  GASTROINTEST:  []  vomiting blood, []  blood in stool     GENITOURINARY:  []  burning with urination, []  blood in urine  PSYCHIATRIC:  []  history of major depression  INTEGUMENTARY:  []  rashes, []  ulcers  CONSTITUTIONAL:  []  fever, []  chills  For VQI Use Only  PRE-ADM LIVING: Home  AMB STATUS: Ambulatory  CAD Sx: History of MI, but no symptoms No MI within 6 months  PRIOR CHF: None  STRESS TEST: [x]  No, [ ]  Normal, [ ]  + ischemia, [ ]  + MI, [ ]  Both   Physical Examination  Filed Vitals:   05/01/15 0939 05/01/15 0942 05/01/15 0944  BP: 158/82 145/76 146/78  Pulse: 55 54 55  Resp: 16    Height: 6\' 2"  (1.88 m)    Weight: 225 lb (102.059 kg)     Body mass index is 28.88 kg/(m^2).  General: A&O x 3, WDWN  Head: [place]/AT  Ear/Nose/Throat: Hearing  grossly intact, nares w/o erythema or drainage, oropharynx w/o Erythema/Exudate, Mallampati score: 3  Eyes: PERRLA, EOMI  Neck: Supple, no nuchal rigidity, no palpable LAD  Pulmonary: Sym exp, good air movt, CTAB, no rales, rhonchi, &  wheezing  Cardiac: RRR, Nl S1, S2, no Murmurs, rubs or gallops  Vascular: Vessel Right Left  Radial Palpable Palpable  Brachial Palpable Palpable  Carotid Palpable, without bruit Palpable, without bruit  Aorta  Not palpable N/A  Femoral Palpable Palpable  Popliteal Not palpable Not palpable  PT Palpable Not Palpable  DP Palpable Fainly Palpable   Gastrointestinal: soft, NTND, -G/R, - HSM, - masses, - CVAT B  Musculoskeletal: M/S 5/5 throughout , Extremities without ischemic changes , B spider veins, small varicosities, no LDS, LLE 1+ edema, RLE no edema, L>R toe cyanosis  Neurologic: CN 2-12 intact , Pain and light touch intact in extremities , Motor exam as listed above  Psychiatric: Judgment intact, Mood & affect appropriate for pt's clinical situation  Dermatologic: See M/S exam for extremity exam, no rashes otherwise noted  Lymph : No Cervical, Axillary, or Inguinal lymphadenopathy   Non-Invasive Vascular Imaging  Outside CAROTID DUPLEX (Date: 05/01/2015):   R ICA stenosis: 60-79% (437/100 c/s)  R VA: patent and antegrade  L ICA stenosis: 60-79% (216/40 c/s)  L VA: patent and antegrade  Outside abd U/S: AAA 3.2 cm  Outside Studies/Documentation 5 pages of outside documents were reviewed including: outside cardiology chart and outside carotid duplex and outside abd duplex study  Medical Decision Making  Thomas Marshall is a 74 y.o. male who presents with: B asx ICA stenosis 60-79% (R>L), small AAA, possible asx LLE PAD, BLE CVI (C2)   The RICA stenosis is nearly 80%.  I offers CTA neck and carotid angiography to confirm such, but pt declined such at this time.  For Asx disease, I usually defer CEA until >80%.  Based on CREST, this  patient would be a better CEA candidate than CAS.  Based on the patient's vascular studies and examination, I have offered the patient: q6 month B carotid duplex and ABI.  Pt wants to continue aortic surveillance with the [place], as it has not be deferred out yet.  I discussed in depth with the patient the nature of atherosclerosis, and emphasized the importance of maximal medical management including strict control of blood pressure, blood glucose, and lipid levels, obtaining regular exercise, antiplatelet agents, and cessation of smoking.   The patient is currently on a statin: Lipitor.  The patient is currently on an anti-platelet: ASA.  He is also on Warfarin for history of afib s/p CABG  The patient is aware that without maximal medical management the underlying atherosclerotic disease process will progress, limiting the benefit of any interventions.  Thank you for allowing [place] to participate in this patient's care.  Adele Barthel, MD [facility] Office: [phone] Pager: [phone]  05/01/2015, 10:20 AM

## 2015-05-01 NOTE — Addendum Note (Signed)
Addended by: Mena Goes on: 05/01/2015 10:50 AM   Modules accepted: Orders

## 2015-05-02 ENCOUNTER — Other Ambulatory Visit: Payer: Self-pay | Admitting: *Deleted

## 2015-05-02 MED ORDER — WARFARIN SODIUM 10 MG PO TABS: 10.0000 mg | ORAL_TABLET | ORAL | Status: DC

## 2015-05-02 NOTE — Telephone Encounter (Signed)
Patient would like a 90 day supply.

## 2015-05-06 ENCOUNTER — Other Ambulatory Visit (HOSPITAL_COMMUNITY): Payer: Self-pay | Admitting: Cardiovascular Disease

## 2015-05-06 DIAGNOSIS — I714 Abdominal aortic aneurysm, without rupture, unspecified: Secondary | ICD-10-CM

## 2015-05-09 ENCOUNTER — Ambulatory Visit: Payer: Medicare Other | Admitting: Family Medicine

## 2015-05-13 ENCOUNTER — Ambulatory Visit (HOSPITAL_COMMUNITY): Payer: Medicare Other | Attending: Cardiology

## 2015-05-13 ENCOUNTER — Ambulatory Visit (INDEPENDENT_AMBULATORY_CARE_PROVIDER_SITE_OTHER): Payer: Medicare Other | Admitting: Family Medicine

## 2015-05-13 ENCOUNTER — Encounter: Payer: Self-pay | Admitting: Family Medicine

## 2015-05-13 VITALS — BP 190/68 | HR 55 | Temp 98.0°F | Ht 74.0 in | Wt 227.0 lb

## 2015-05-13 DIAGNOSIS — N183 Chronic kidney disease, stage 3 unspecified: Secondary | ICD-10-CM

## 2015-05-13 DIAGNOSIS — I10 Essential (primary) hypertension: Secondary | ICD-10-CM

## 2015-05-13 DIAGNOSIS — Z951 Presence of aortocoronary bypass graft: Secondary | ICD-10-CM

## 2015-05-13 DIAGNOSIS — Z87891 Personal history of nicotine dependence: Secondary | ICD-10-CM | POA: Insufficient documentation

## 2015-05-13 DIAGNOSIS — I714 Abdominal aortic aneurysm, without rupture, unspecified: Secondary | ICD-10-CM

## 2015-05-13 DIAGNOSIS — E785 Hyperlipidemia, unspecified: Secondary | ICD-10-CM

## 2015-05-13 DIAGNOSIS — I6523 Occlusion and stenosis of bilateral carotid arteries: Secondary | ICD-10-CM | POA: Diagnosis not present

## 2015-05-13 DIAGNOSIS — I2510 Atherosclerotic heart disease of native coronary artery without angina pectoris: Secondary | ICD-10-CM | POA: Insufficient documentation

## 2015-05-13 DIAGNOSIS — I700 Atherosclerosis of aorta: Secondary | ICD-10-CM | POA: Insufficient documentation

## 2015-05-13 DIAGNOSIS — E1122 Type 2 diabetes mellitus with diabetic chronic kidney disease: Secondary | ICD-10-CM | POA: Diagnosis present

## 2015-05-13 DIAGNOSIS — N189 Chronic kidney disease, unspecified: Secondary | ICD-10-CM

## 2015-05-13 DIAGNOSIS — E119 Type 2 diabetes mellitus without complications: Secondary | ICD-10-CM | POA: Insufficient documentation

## 2015-05-13 LAB — POCT GLYCOSYLATED HEMOGLOBIN (HGB A1C): HEMOGLOBIN A1C: 7.3

## 2015-05-13 NOTE — Patient Instructions (Signed)
It was nice to see you today.  I will mail you a copy of your labs.  Please have them send me paperwork regarding your diabetic shoes.  Take care  [facility] DO

## 2015-05-14 ENCOUNTER — Encounter: Payer: Self-pay | Admitting: Family Medicine

## 2015-05-14 DIAGNOSIS — N183 Chronic kidney disease, stage 3 unspecified: Secondary | ICD-10-CM | POA: Insufficient documentation

## 2015-05-14 LAB — BASIC METABOLIC PANEL WITH GFR
BUN: 29 mg/dL — AB (ref 6–23)
CHLORIDE: 103 meq/L (ref 96–112)
CO2: 26 mEq/L (ref 19–32)
CREATININE: 1.42 mg/dL — AB (ref 0.50–1.35)
Calcium: 8.7 mg/dL (ref 8.4–10.5)
GFR, Est African American: 55 mL/min — ABNORMAL LOW
GFR, Est Non African American: 48 mL/min — ABNORMAL LOW
GLUCOSE: 107 mg/dL — AB (ref 70–99)
POTASSIUM: 3.9 meq/L (ref 3.5–5.3)
Sodium: 140 mEq/L (ref 135–145)

## 2015-05-14 LAB — CBC
HEMATOCRIT: 36.6 % — AB (ref 39.0–52.0)
Hemoglobin: 12.4 g/dL — ABNORMAL LOW (ref 13.0–17.0)
MCH: 29.7 pg (ref 26.0–34.0)
MCHC: 33.9 g/dL (ref 30.0–36.0)
MCV: 87.8 fL (ref 78.0–100.0)
MPV: 10.2 fL (ref 8.6–12.4)
Platelets: 142 10*3/uL — ABNORMAL LOW (ref 150–400)
RBC: 4.17 MIL/uL — ABNORMAL LOW (ref 4.22–5.81)
RDW: 14.9 % (ref 11.5–15.5)
WBC: 6 10*3/uL (ref 4.0–10.5)

## 2015-05-14 LAB — LIPID PANEL
CHOLESTEROL: 146 mg/dL (ref 0–200)
HDL: 34 mg/dL — ABNORMAL LOW (ref >=40)
LDL Cholesterol: 63 mg/dL (ref 0–99)
Total CHOL/HDL Ratio: 4.3 Ratio
Triglycerides: 245 mg/dL — ABNORMAL HIGH (ref <150)
VLDL: 49 mg/dL — ABNORMAL HIGH (ref 0–40)

## 2015-05-14 NOTE — Progress Notes (Signed)
   Subjective:    Patient ID: Thomas Marshall, male    DOB: 02/24/1941, 74 y.o.   MRN: [id number]  HPI 74 year old male with a PMH of HTN, HLD, CAD s/p CABG, AAA, DM-2, and CKD presents for follow up and is requesting diabetic shoes.   1) DM-2  Has been well controlled. Last A1C was 6.6 in October.   CBG's - Checking occasionally.   Medications - Metformin 1000 mg daily.   Compliance - yes.   Preventative Healthcare up to date?  In need of - Eye exam.   Requesting Diabetic shoes today.  2) HTN  Previously well controlled. BP elevated today (see below).   Home BP Monitoring - No.   Medications - Clonidine 0.1 mg BID, Aldactone 25 mg daily.   Compliance -  Yes.   ROS: Denies chest pain, SOB, lightheadedness/dizziness   3) HLD  Stable.  Compliant with statin. No reported medication side effects.  4) CAD  Stable.   No recent chest pain.  Social Hx: Former Research scientist (life sciences).   Review of Systems  Constitutional: Negative for fever and chills.  Respiratory: Negative for chest tightness and shortness of breath.   Cardiovascular: Negative for chest pain.      Objective:   Physical Exam Filed Vitals:   05/13/15 1610  BP: 190/68  Pulse: 55  Temp: 98 F (36.7 C)   Vital signs reviewed.  Exam: General: well appearing elderly gentleman in NAD.  HEENT: NCAT.  Neck: Supple; No adenopathy noted.  Cardiovascular: Bradycardic. No murmurs, rubs, or gallops. Respiratory: CTAB. No rales, rhonchi, or wheeze.  Diabetic Foot Check -  Appearance - no lesions, ulcers; Callus noted at proximal 5th metatarsal (R>L) . Skin - no unusual pallor or redness Monofilament testing -  Right - Great toe, medial, central, lateral ball and posterior foot intact Left - Great toe, lateral ball and posterior foot intact. Slight decrease in sensation medial/central portion of ball of foot.      Assessment & Plan:  See Problem List

## 2015-05-14 NOTE — Assessment & Plan Note (Signed)
Well controlled.  -Continue statin

## 2015-05-14 NOTE — Assessment & Plan Note (Signed)
Uncontrolled today but patient normally at goal. Given multiple allergies/intolerances, will have patient follow up for BP check and make adjustment/additions as necessary.

## 2015-05-14 NOTE — Assessment & Plan Note (Addendum)
Not at goal. A1C 7.3 today (done after visit). Patient to follow up to discuss further medication/treatment.  Rx given for Diabetic shoes.

## 2015-05-14 NOTE — Assessment & Plan Note (Signed)
Followed by Cards/Vascular.

## 2015-05-14 NOTE — Assessment & Plan Note (Signed)
Stable. No recent chest pain.

## 2015-05-15 ENCOUNTER — Telehealth: Payer: Self-pay | Admitting: *Deleted

## 2015-05-15 NOTE — Telephone Encounter (Signed)
LVM for pt to return call to inform him of below and see about getting the nurse visit scheduled. Katharina Caper, Leiah Giannotti D, [place]

## 2015-05-15 NOTE — Telephone Encounter (Signed)
-----   Message from Coral Spikes, DO sent at 05/14/2015  7:37 AM EDT ----- Please have patient return for RN BP check sometime this week (if possible).  Thanks  [employer] DO

## 2015-05-17 ENCOUNTER — Ambulatory Visit (INDEPENDENT_AMBULATORY_CARE_PROVIDER_SITE_OTHER): Payer: Medicare Other | Admitting: *Deleted

## 2015-05-17 VITALS — BP 153/61 | HR 62

## 2015-05-17 DIAGNOSIS — I10 Essential (primary) hypertension: Secondary | ICD-10-CM

## 2015-05-17 NOTE — Progress Notes (Signed)
Pt presents today for BP followup.  His BP is 153/61 pulse 62.  Pt denies CP, SOB etc.  Will forward to MD to see if changes to meds need to be done.   Also,  Pt is inquiring about the diabetic shoes form.  Advised that the form is not in MD box and explained out policy for DME (must be dropped off by pt of brought to an office visit, if faxed it is normally shredded)  Did call Ovid Curd @ [employer] [phone]) and LMOVM asking him to fax again to my attention.  Pt would like update if we receive form again. Ahleah Simko, Salome Spotted

## 2015-05-20 NOTE — Progress Notes (Signed)
I received this form and filled out and faxed it myself.

## 2015-05-21 NOTE — Progress Notes (Signed)
Called Thomas Marshall to followup.  They did receive form from Dr. Lacinda Axon and ordered the shoes last week.  Per Thomas Marshall he has already contacted pt and let him know. Lamiya Naas, Salome Spotted

## 2015-05-30 ENCOUNTER — Ambulatory Visit (INDEPENDENT_AMBULATORY_CARE_PROVIDER_SITE_OTHER): Payer: Medicare Other | Admitting: Family Medicine

## 2015-05-30 DIAGNOSIS — Z0289 Encounter for other administrative examinations: Secondary | ICD-10-CM

## 2015-05-30 NOTE — Progress Notes (Signed)
Patient in need of dental form filled out. Form completed.  Patient not seen.

## 2015-06-04 ENCOUNTER — Telehealth: Payer: Self-pay | Admitting: Cardiovascular Disease

## 2015-06-04 NOTE — Telephone Encounter (Signed)
New Message  Pt following up on faxed information to dental clinic. Please call back and discuss.

## 2015-06-04 NOTE — Telephone Encounter (Signed)
I spoke with the pt and made him aware that I am waiting to speak with Dr Burt Knack to see if he has form. I will follow-up with the pt tomorrow.

## 2015-06-04 NOTE — Telephone Encounter (Signed)
Follow up     Pt calling back regarding paperwork for dental work.  Please call to advise

## 2015-06-04 NOTE — Telephone Encounter (Signed)
I spoke with the pt and he states that he dropped a form off in our office last week for Dr Burt Knack to complete. The form is from [facility] located in [place], [place] and they need instructions in regards to holding coumadin.  I will follow up with Dr Burt Knack to see if he has the pt's form.

## 2015-06-05 ENCOUNTER — Telehealth: Payer: Self-pay | Admitting: Cardiovascular Disease

## 2015-06-05 NOTE — Telephone Encounter (Signed)
I spoke with the pt and he said the dental office called him today and said that they had received the fax from our office. The form is now scanned into the pt's chart under the Media tab.

## 2015-06-05 NOTE — Telephone Encounter (Signed)
B and E Consultation request form faxed to Dr.Robert Clark at [phone].

## 2015-06-14 IMAGING — CR DG FOOT COMPLETE 3+V*L*
3 series · 3 of 3 positions shown · non-contrast
Comparison: None.

CLINICAL DATA: Fall today.

EXAM:
LEFT FOOT - COMPLETE 3+ VIEW

[t foot ap left]
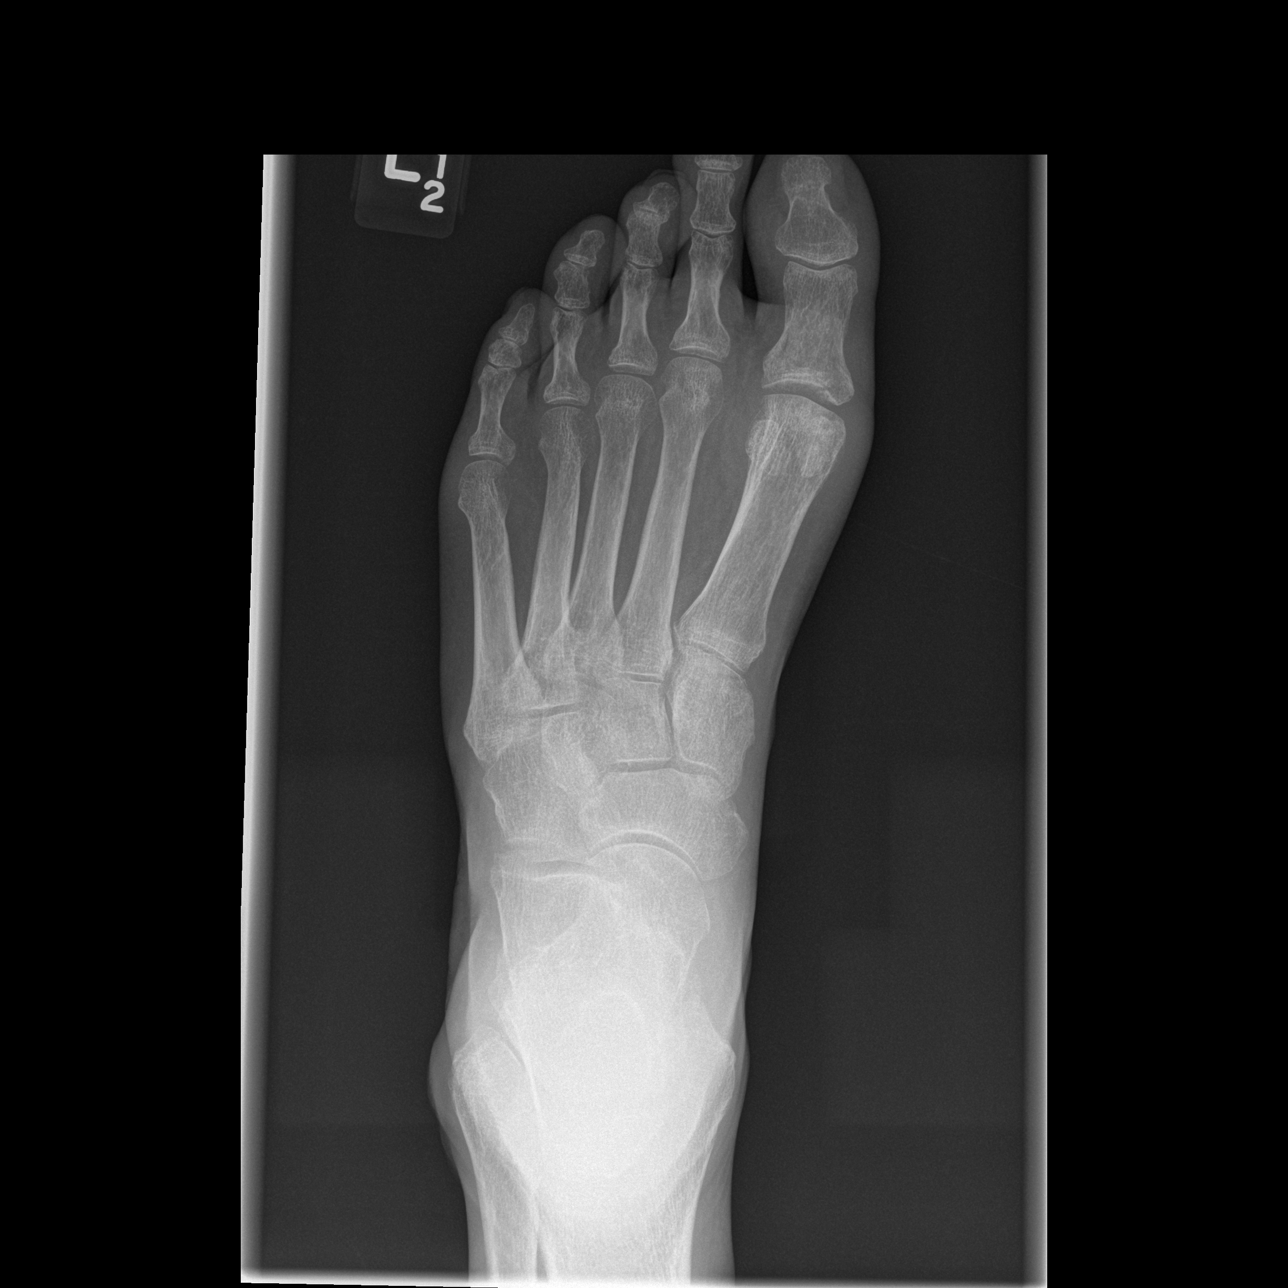

[t foot oblique left]
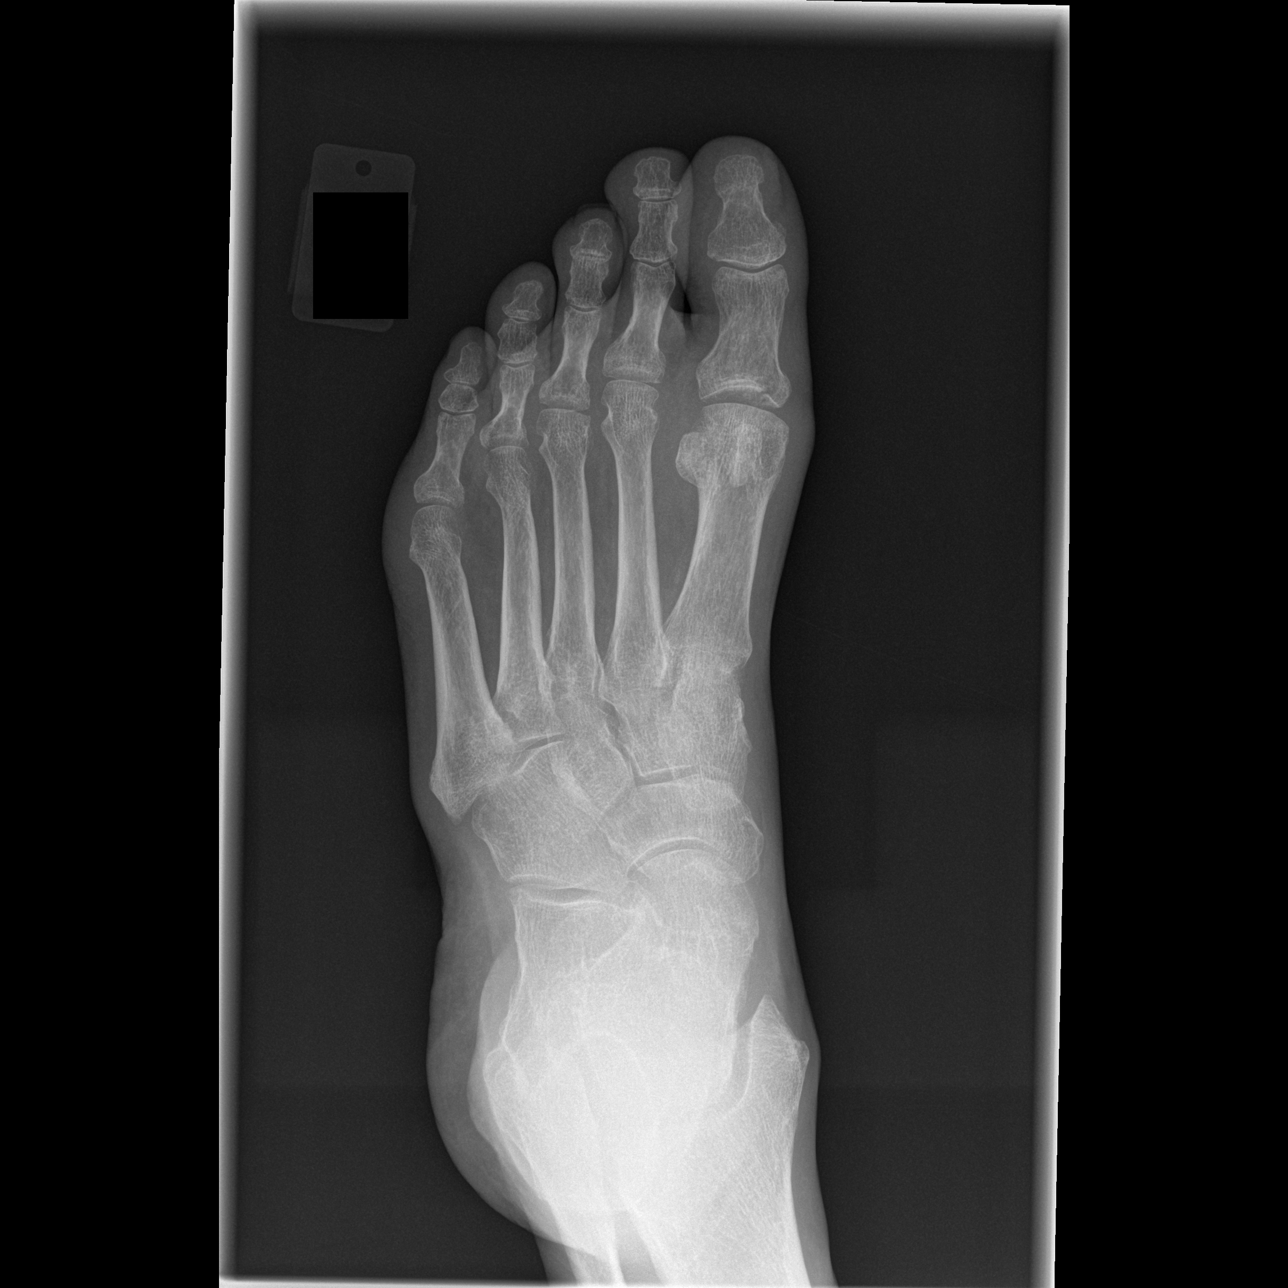

[t foot lat left]
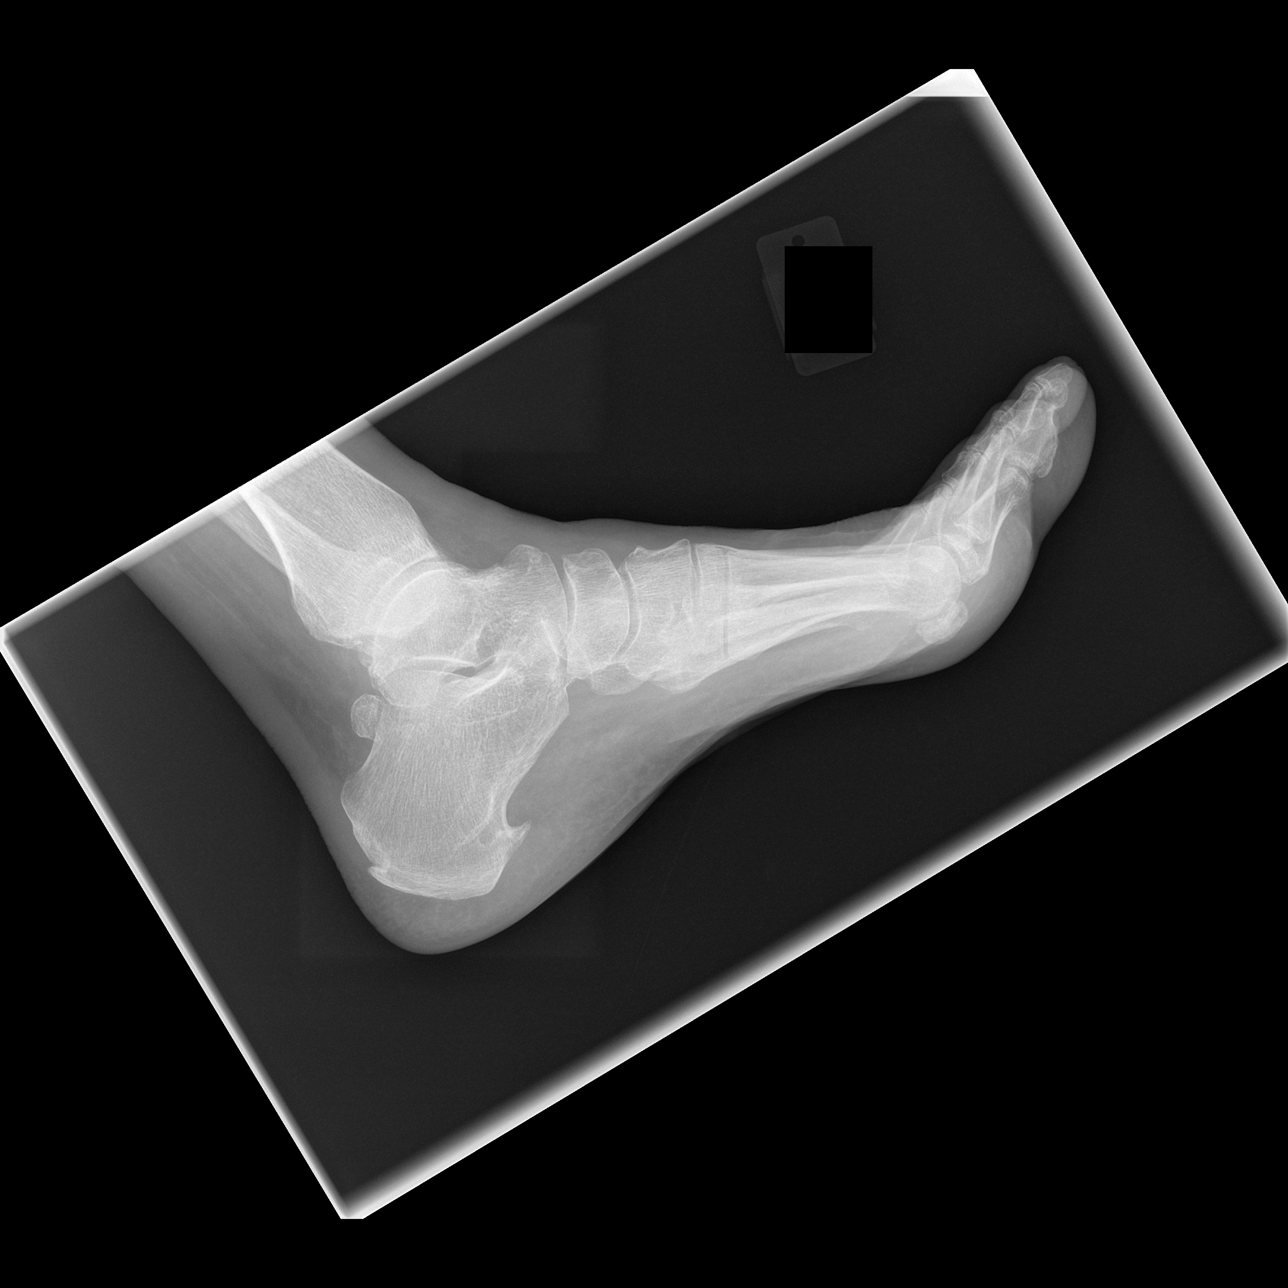

[3 of 3 positions shown; findings below may reference images not displayed]

FINDINGS: Comminuted mildly displaced fracture of the first proximal phalanx
extending into the metatarsophalangeal joint.

No other fracture.  Calcaneal spurring.
IMPRESSION: Mildly displaced intra-articular fracture of the first proximal
phalanx.

## 2015-06-18 ENCOUNTER — Ambulatory Visit (INDEPENDENT_AMBULATORY_CARE_PROVIDER_SITE_OTHER): Payer: Medicare Other | Admitting: *Deleted

## 2015-06-18 DIAGNOSIS — Z7901 Long term (current) use of anticoagulants: Secondary | ICD-10-CM

## 2015-06-18 DIAGNOSIS — I4891 Unspecified atrial fibrillation: Secondary | ICD-10-CM

## 2015-06-18 LAB — POCT INR: INR: 1.7

## 2015-07-01 ENCOUNTER — Ambulatory Visit (INDEPENDENT_AMBULATORY_CARE_PROVIDER_SITE_OTHER): Payer: Medicare Other | Admitting: *Deleted

## 2015-07-01 DIAGNOSIS — I4891 Unspecified atrial fibrillation: Secondary | ICD-10-CM | POA: Diagnosis not present

## 2015-07-01 DIAGNOSIS — Z7901 Long term (current) use of anticoagulants: Secondary | ICD-10-CM | POA: Diagnosis not present

## 2015-07-01 LAB — POCT INR: INR: 3

## 2015-07-09 ENCOUNTER — Ambulatory Visit (INDEPENDENT_AMBULATORY_CARE_PROVIDER_SITE_OTHER): Payer: Medicare Other | Admitting: Urgent Care

## 2015-07-09 VITALS — BP 132/76 | HR 69 | Temp 97.7°F | Resp 16 | Ht 74.0 in | Wt 225.2 lb

## 2015-07-09 DIAGNOSIS — D649 Anemia, unspecified: Secondary | ICD-10-CM | POA: Diagnosis not present

## 2015-07-09 DIAGNOSIS — L02419 Cutaneous abscess of limb, unspecified: Secondary | ICD-10-CM

## 2015-07-09 DIAGNOSIS — I6523 Occlusion and stenosis of bilateral carotid arteries: Secondary | ICD-10-CM | POA: Diagnosis not present

## 2015-07-09 DIAGNOSIS — L03119 Cellulitis of unspecified part of limb: Secondary | ICD-10-CM | POA: Diagnosis not present

## 2015-07-09 DIAGNOSIS — R590 Localized enlarged lymph nodes: Secondary | ICD-10-CM

## 2015-07-09 LAB — POCT CBC
Granulocyte percent: 67.3 %G (ref 37–80)
HCT, POC: 37.4 % — AB (ref 43.5–53.7)
Hemoglobin: 11.8 g/dL — AB (ref 14.1–18.1)
Lymph, poc: 1.5 (ref 0.6–3.4)
MCH: 28.6 pg (ref 27–31.2)
MCHC: 31.6 g/dL — AB (ref 31.8–35.4)
MCV: 90.3 fL (ref 80–97)
MID (cbc): 0.4 (ref 0–0.9)
MPV: 6.8 fL (ref 0–99.8)
PLATELET COUNT, POC: 159 10*3/uL (ref 142–424)
POC Granulocyte: 3.9 (ref 2–6.9)
POC LYMPH PERCENT: 25.9 %L (ref 10–50)
POC MID %: 6.8 % (ref 0–12)
RBC: 4.14 M/uL — AB (ref 4.69–6.13)
RDW, POC: 14.4 %
WBC: 5.8 10*3/uL (ref 4.6–10.2)

## 2015-07-09 NOTE — Progress Notes (Signed)
MRN: [id number] DOB: 09/08/1941  Subjective:   Thomas Marshall is a 74 y.o. male with pmh of diabetes presenting for chief complaint of Recurrent Skin Infections and Lymphadenopathy  Reports 3 day history of right leg infection, was worst this morning, had significant redness. This redness has decreased throughout the day, it was draining some. Denies fevers, streaking, swelling. Pain is mild in severity. Has history of skin abscesses requiring I&D.  Lymphadenopathy - reports several week history of intermittent lymphadenopathy. He had previously reported this to his PCP, who also examined him and advised that the had normal findings. However, patient was to report to his PCP if this problem arose again. He denies, fever, weight loss, night sweats, history of cancer, sore throat, difficulty swallowing, ear pain, ear drainage.  Denies any other aggravating or relieving factors, no other questions or concerns.  Thomas Marshall has a current medication list which includes the following prescription(s): aspirin, atorvastatin, cholecalciferol, clonidine, metformin, omeprazole, spironolactone, vitamin c, warfarin, nitroglycerin, and vitamin e. He is allergic to carvedilol; lisinopril; losartan; nifedipine; triamterene; amoxicillin; and ampicillin.  Thomas Marshall  has a past medical history of Cancer; Diabetes mellitus without complication; GERD (gastroesophageal reflux disease); Hyperlipidemia; Hypertension; Coronary artery disease; and PAF (paroxysmal atrial fibrillation). Also  has past surgical history that includes Exploration post operative open heart; Urinary surgery; Ankle surgery; Knee surgery; Back surgery; Finger surgery; Coronary artery bypass graft (2004); Tonsillectomy; Colonoscopy; Hernia repair (Right); Cholecystectomy (N/A, 12/07/2013); Joint replacement; and Prostate surgery.  ROS As in subjective.  Objective:   Vitals: BP 132/76 mmHg  Pulse 69  Temp(Src) 97.7 F (36.5 C) (Oral)  Resp 16  Ht 6\' 2"   (1.88 m)  Wt 225 lb 3.2 oz (102.15 kg)  BMI 28.90 kg/m2  SpO2 99%  Physical Exam  Constitutional: He is oriented to person, place, and time. He appears well-developed and well-nourished.  HENT:  Mouth/Throat: Oropharynx is clear and moist.  Eyes: Conjunctivae are normal. Right eye exhibits no discharge. Left eye exhibits no discharge. No scleral icterus.  Neck: Normal range of motion. Neck supple. No thyromegaly present.  Cardiovascular: Normal rate.   Pulmonary/Chest: Effort normal. No stridor.  Musculoskeletal:       Legs: Lymphadenopathy:    He has cervical adenopathy (mild, right-sided, anterior).  Neurological: He is alert and oriented to person, place, and time.  Skin: Skin is warm and dry. No rash noted. No erythema. No pallor.  Psychiatric: He has a normal mood and affect.    Results for orders placed or performed in visit on 07/09/15 (from the past 24 hour(s))  POCT CBC     Status: Abnormal   Collection Time: 07/09/15  5:00 PM  Result Value Ref Range   WBC 5.8 4.6 - 10.2 K/uL   Lymph, poc 1.5 0.6 - 3.4   POC LYMPH PERCENT 25.9 10 - 50 %L   MID (cbc) 0.4 0 - 0.9   POC MID % 6.8 0 - 12 %M   POC Granulocyte 3.9 2 - 6.9   Granulocyte percent 67.3 37 - 80 %G   RBC 4.14 (A) 4.69 - 6.13 M/uL   Hemoglobin 11.8 (A) 14.1 - 18.1 g/dL   HCT, POC 37.4 (A) 43.5 - 53.7 %   MCV 90.3 80 - 97 fL   MCH, POC 28.6 27 - 31.2 pg   MCHC 31.6 (A) 31.8 - 35.4 g/dL   RDW, POC 14.4 %   Platelet Count, POC 159 142 - 424 K/uL   MPV 6.8  0 - 99.8 fL   PROCEDURE NOTE: I&D of Abscess Verbal consent obtained. Local anesthesia with 1cc of 2% lidocaine plain. Site cleansed with Betadine.  Incision of 0.5cm was made using a 11 blade, discharge of small amount of pus and serosanguinous fluid. Wound cavity was explored with curved hemostats and packed with 1/4" plain packing. Cleansed and dressed. After care instructions provided.  Assessment and Plan :   1. Cellulitis and abscess of leg -  Stable, I&D performed. RTC in 2 days. For now, will hold off on antibiotic course. - POCT CBC - Wound culture  2. Lymphadenopathy of right cervical region - Stable, offered CBC and [place]. Patient would like to work this up with his previous PCP or when he establishes care with someone else.  3. Anemia, unspecified anemia type - Patient has had longstanding undifferentiated mild anemia. He was previously on iron supplement until his PCP advised that he did not have to take this. I agreed with this previous recommendation. I did advise he have his CBC rechecked at next annual or after he establishes care with new PCP.  Jaynee Eagles, PA-C [facility] [phone] 07/09/2015 4:33 PM

## 2015-07-09 NOTE — Patient Instructions (Signed)
Incision and Drainage Incision and drainage is a procedure in which a sac-like structure (cystic structure) is opened and drained. The area to be drained usually contains material such as pus, fluid, or blood.  LET YOUR CAREGIVER KNOW ABOUT:   Allergies to medicine.  Medicines taken, including vitamins, herbs, eyedrops, over-the-counter medicines, and creams.  Use of steroids (by mouth or creams).  Previous problems with anesthetics or numbing medicines.  History of bleeding problems or blood clots.  Previous surgery.  Other health problems, including diabetes and kidney problems.  Possibility of pregnancy, if this applies. RISKS AND COMPLICATIONS  Pain.  Bleeding.  Scarring.  Infection. BEFORE THE PROCEDURE  You may need to have an ultrasound or other imaging tests to see how large or deep your cystic structure is. Blood tests may also be used to determine if you have an infection or how severe the infection is. You may need to have a tetanus shot. PROCEDURE  The affected area is cleaned with a cleaning fluid. The cyst area will then be numbed with a medicine (local anesthetic). A small incision will be made in the cystic structure. A syringe or catheter may be used to drain the contents of the cystic structure, or the contents may be squeezed out. The area will then be flushed with a cleansing solution. After cleansing the area, it is often gently packed with a gauze or another wound dressing. Once it is packed, it will be covered with gauze and tape or some other type of wound dressing. AFTER THE PROCEDURE   Often, you will be allowed to go home right after the procedure.  You may be given antibiotic medicine to prevent or heal an infection.  If the area was packed with gauze or some other wound dressing, you will likely need to come back in 1 to 2 days to get it removed.  The area should heal in about 14 days. Document Released: 05/12/2001 Document Revised: 05/17/2012  Document Reviewed: 01/11/2012 ExitCare Patient Information 2015 [employer]. This information is not intended to replace advice given to you by your health care provider. Make sure you discuss any questions you have with your health care provider.  

## 2015-07-11 ENCOUNTER — Ambulatory Visit (INDEPENDENT_AMBULATORY_CARE_PROVIDER_SITE_OTHER): Payer: Medicare Other | Admitting: Urgent Care

## 2015-07-11 VITALS — BP 146/70 | HR 64 | Temp 97.9°F | Resp 15 | Ht 74.0 in | Wt 227.0 lb

## 2015-07-11 DIAGNOSIS — I6523 Occlusion and stenosis of bilateral carotid arteries: Secondary | ICD-10-CM | POA: Diagnosis not present

## 2015-07-11 DIAGNOSIS — L03119 Cellulitis of unspecified part of limb: Secondary | ICD-10-CM | POA: Diagnosis not present

## 2015-07-11 DIAGNOSIS — L02419 Cutaneous abscess of limb, unspecified: Secondary | ICD-10-CM

## 2015-07-11 NOTE — Progress Notes (Signed)
    MRN: [id number] DOB: [birth year]  Subjective:   Thomas Marshall is a 74 y.o. male with pmh of DM, anticoagulation presenting for follow up on abscess of thigh, s/p I&D on 07/09/2015. Reports improvement in redness, pain, swelling. Has changed dressing daily. Denies fever. Denies any other aggravating or relieving factors, no other questions or concerns.  Thomas Marshall has a current medication list which includes the following prescription(s): aspirin, atorvastatin, cholecalciferol, clonidine, metformin, omeprazole, spironolactone, vitamin c, vitamin e, warfarin, and nitroglycerin. Also is allergic to carvedilol; lisinopril; losartan; nifedipine; triamterene; amoxicillin; and ampicillin.  Thomas Marshall  has a past medical history of Cancer; Diabetes mellitus without complication; GERD (gastroesophageal reflux disease); Hyperlipidemia; Hypertension; Coronary artery disease; and PAF (paroxysmal atrial fibrillation). Also  has past surgical history that includes Exploration post operative open heart; Urinary surgery; Ankle surgery; Knee surgery; Back surgery; Finger surgery; Coronary artery bypass graft (2004); Tonsillectomy; Colonoscopy; Hernia repair (Right); Cholecystectomy (N/A, 12/07/2013); Joint replacement; and Prostate surgery.  Objective:   Vitals: BP 146/70 mmHg  Pulse 64  Temp(Src) 97.9 F (36.6 C) (Oral)  Resp 15  Ht 6\' 2"  (1.88 m)  Wt 227 lb (102.967 kg)  BMI 29.13 kg/m2  SpO2 98%  Physical Exam  Constitutional: He is oriented to person, place, and time. He appears well-developed and well-nourished.  Cardiovascular: Normal rate.   Pulmonary/Chest: Effort normal.  Neurological: He is alert and oriented to person, place, and time.  Skin: Skin is warm and dry. No rash noted. No erythema. No pallor.     Psychiatric: He has a normal mood and affect.   WOUND CARE: abscess s/p I&D Dressing removed, packing removed which contained small amount of purulent material. Wound expressed without any  additional drainage of pus and explored for loculations. Wound irrigated with 30cc of sterile water. Packed with 1/4" packing. Cleansed and dressed.  Assessment and Plan :   1. Cellulitis and abscess of leg - Improved, wound culture is still pending. His previous visit we held off on using antibiotics due to interactions with Coumadin and known allergic reaction with amoxicillin, causes rash. Given his improvement I would like to wait till on culture results and base of his progress on Saturday, 07/13/2015 we may potentially start an antibiotic like doxycycline. Patient has previously used Septra in 2014 and doxycycline in 2014 and 2015.  Thomas Eagles, PA-C [facility] [phone] 07/11/2015 12:40 PM

## 2015-07-11 NOTE — Patient Instructions (Signed)
Incision and Drainage Incision and drainage is a procedure in which a sac-like structure (cystic structure) is opened and drained. The area to be drained usually contains material such as pus, fluid, or blood.  LET YOUR CAREGIVER KNOW ABOUT:   Allergies to medicine.  Medicines taken, including vitamins, herbs, eyedrops, over-the-counter medicines, and creams.  Use of steroids (by mouth or creams).  Previous problems with anesthetics or numbing medicines.  History of bleeding problems or blood clots.  Previous surgery.  Other health problems, including diabetes and kidney problems.  Possibility of pregnancy, if this applies. RISKS AND COMPLICATIONS  Pain.  Bleeding.  Scarring.  Infection. BEFORE THE PROCEDURE  You may need to have an ultrasound or other imaging tests to see how large or deep your cystic structure is. Blood tests may also be used to determine if you have an infection or how severe the infection is. You may need to have a tetanus shot. PROCEDURE  The affected area is cleaned with a cleaning fluid. The cyst area will then be numbed with a medicine (local anesthetic). A small incision will be made in the cystic structure. A syringe or catheter may be used to drain the contents of the cystic structure, or the contents may be squeezed out. The area will then be flushed with a cleansing solution. After cleansing the area, it is often gently packed with a gauze or another wound dressing. Once it is packed, it will be covered with gauze and tape or some other type of wound dressing. AFTER THE PROCEDURE   Often, you will be allowed to go home right after the procedure.  You may be given antibiotic medicine to prevent or heal an infection.  If the area was packed with gauze or some other wound dressing, you will likely need to come back in 1 to 2 days to get it removed.  The area should heal in about 14 days. Document Released: 05/12/2001 Document Revised: 05/17/2012  Document Reviewed: 01/11/2012 ExitCare Patient Information 2015 [employer]. This information is not intended to replace advice given to you by your health care provider. Make sure you discuss any questions you have with your health care provider.  

## 2015-07-12 ENCOUNTER — Telehealth: Payer: Self-pay | Admitting: Urgent Care

## 2015-07-12 DIAGNOSIS — L02416 Cutaneous abscess of left lower limb: Secondary | ICD-10-CM

## 2015-07-12 LAB — WOUND CULTURE: Gram Stain: NONE SEEN

## 2015-07-12 MED ORDER — DOXYCYCLINE HYCLATE 100 MG PO CAPS: 100.0000 mg | ORAL_CAPSULE | Freq: Two times a day (BID) | ORAL | Status: DC

## 2015-07-12 NOTE — Telephone Encounter (Signed)
Due to culture results, will start patient on doxycycline to cover for MRSA. Patient notified and agreed. He previously took this without a problem for infection.

## 2015-07-13 ENCOUNTER — Ambulatory Visit (INDEPENDENT_AMBULATORY_CARE_PROVIDER_SITE_OTHER): Payer: Medicare Other | Admitting: Physician Assistant

## 2015-07-13 VITALS — BP 128/80 | HR 62 | Temp 98.7°F | Resp 17 | Ht 74.5 in | Wt 227.0 lb

## 2015-07-13 DIAGNOSIS — L0390 Cellulitis, unspecified: Secondary | ICD-10-CM

## 2015-07-13 DIAGNOSIS — B9562 Methicillin resistant Staphylococcus aureus infection as the cause of diseases classified elsewhere: Secondary | ICD-10-CM

## 2015-07-13 MED ORDER — MUPIROCIN 2 % EX OINT: 1.0000 "application " | TOPICAL_OINTMENT | Freq: Two times a day (BID) | CUTANEOUS | Status: DC

## 2015-07-13 NOTE — Progress Notes (Signed)
Thomas Marshall  MRN: [id number] DOB: Apr 05, 1941  Subjective:  Pt presents to clinic for wound recheck.  He has been changing his drsg daily.  He is now taking the abx and he is tolerating it ok.  He would like to know if there is anything he can do to prevent these in the future - this is his 3rd one but only the 1st one that has been cultured that he knows of.  Patient Active Problem List   Diagnosis Date Noted  . Cellulitis and abscess of leg 07/09/2015  . CKD (chronic kidney disease) stage 3, GFR 30-59 ml/min 05/14/2015  . Carotid stenosis 05/01/2015  . PVD (peripheral vascular disease) 04/09/2015  . AAA (abdominal aortic aneurysm) 04/09/2015  . S/P CABG x 4 2004 09/10/2014  . Atrial fibrillation 09/04/2013  . Long term current use of anticoagulant therapy 09/04/2013  . Hx of squamous cell carcinoma of skin   . Type 2 diabetes mellitus with renal manifestations   . GERD (gastroesophageal reflux disease)   . Hyperlipidemia   . Hypertension     Current Outpatient Prescriptions on File Prior to Visit  Medication Sig Dispense Refill  . aspirin 81 MG tablet Take 81 mg by mouth daily.    [name] atorvastatin (LIPITOR) 40 MG tablet Take 40 mg by mouth daily.    . cholecalciferol (VITAMIN D) 1000 UNITS tablet Take 1,000 Units by mouth daily.    . cloNIDine (CATAPRES) 0.1 MG tablet Take 1 tablet (0.1 mg total) by mouth 2 (two) times daily. 180 tablet 3  . doxycycline (VIBRAMYCIN) 100 MG capsule Take 1 capsule (100 mg total) by mouth 2 (two) times daily. 20 capsule 0  . metFORMIN (GLUCOPHAGE) 1000 MG tablet Take 1,000 mg by mouth daily after supper.    [name] omeprazole (PRILOSEC) 20 MG capsule Take 20 mg by mouth daily.    [name] spironolactone (ALDACTONE) 25 MG tablet Take 1 tablet (25 mg total) by mouth daily. 90 tablet 3  . vitamin C (ASCORBIC ACID) 500 MG tablet Take 500 mg by mouth daily.    . vitamin E 400 UNIT capsule Take 400 Units by mouth daily.    [name] warfarin (COUMADIN) 10 MG tablet Take 1  tablet (10 mg total) by mouth as directed. 100 tablet 1  . nitroGLYCERIN (NITROSTAT) 0.4 MG SL tablet Place 1 tablet (0.4 mg total) under the tongue every 5 (five) minutes as needed for chest pain. (Patient not taking: Reported on 07/09/2015) 25 tablet 3   No current facility-administered medications on file prior to visit.    Allergies  Allergen Reactions  . Carvedilol     Low heart rate   . Lisinopril Swelling    Angioedema.   . Losartan Other (See Comments)    Angioedema   . Nifedipine Swelling  . Triamterene Swelling  . Amoxicillin Rash  . Ampicillin Rash    Review of Systems  Constitutional: Negative for fever and chills.  Skin: Positive for wound.   Objective:  BP 128/80 mmHg  Pulse 62  Temp(Src) 98.7 F (37.1 C) (Oral)  Resp 17  Ht 6' 2.5" (1.892 m)  Wt 227 lb (102.967 kg)  BMI 28.76 kg/m2  SpO2 98%  Physical Exam  Constitutional: He is oriented to person, place, and time and well-developed, well-nourished, and in no distress.  HENT:  Head: Normocephalic and atraumatic.  Right Ear: External ear normal.  Left Ear: External ear normal.  Eyes: Conjunctivae are normal.  Neck: Normal range of  motion.  Pulmonary/Chest: Effort normal.  Neurological: He is alert and oriented to person, place, and time. Gait normal.  Skin: Skin is warm and dry.  Left lateral groin area - drsg and packing removed - 1cm open wound with minimal surrounding induration and no erythema - necrotic tissue removed from the wound cavity (small amount on the lateral border of the wound cavity) - the wound was repacked and drsg placed.  Psychiatric: Mood, memory, affect and judgment normal.    Assessment and Plan :  MRSA cellulitis - Plan: mupirocin ointment (BACTROBAN) 2 %   Recheck in 2-3d for packing change.  Continue to change drsg daily.  Due to this being his 3rd abscess and it being MRSA we will treat for possible carrier state with bactroban nasal bid for 5 days.  Windell Hummingbird PA-C    [facility] 07/13/2015 1:17 PM

## 2015-07-15 ENCOUNTER — Ambulatory Visit (INDEPENDENT_AMBULATORY_CARE_PROVIDER_SITE_OTHER): Payer: Medicare Other | Admitting: Urgent Care

## 2015-07-15 VITALS — BP 122/58 | HR 54 | Temp 98.3°F | Resp 18 | Ht 74.5 in | Wt 225.0 lb

## 2015-07-15 DIAGNOSIS — B9562 Methicillin resistant Staphylococcus aureus infection as the cause of diseases classified elsewhere: Secondary | ICD-10-CM

## 2015-07-15 DIAGNOSIS — L02416 Cutaneous abscess of left lower limb: Secondary | ICD-10-CM

## 2015-07-15 DIAGNOSIS — L0390 Cellulitis, unspecified: Secondary | ICD-10-CM

## 2015-07-15 NOTE — Progress Notes (Signed)
    MRN: [id number] DOB: [birth year]  Subjective:   Thomas Marshall is a 74 y.o. male presenting for follow up on abscess of thigh s/p I&D. Reports significant improvement in pain, redness. Is currently taking doxy. Denies fever, spontaneous drainage of pus or bleeding. Denies any other aggravating or relieving factors, no other questions or concerns.  [name] has a current medication list which includes the following prescription(s): aspirin, atorvastatin, cholecalciferol, clonidine, doxycycline, metformin, mupirocin ointment, omeprazole, spironolactone, vitamin c, vitamin e, warfarin, and nitroglycerin. Also is allergic to carvedilol; lisinopril; losartan; nifedipine; triamterene; amoxicillin; and ampicillin.  [name]  has a past medical history of Cancer; Diabetes mellitus without complication; GERD (gastroesophageal reflux disease); Hyperlipidemia; Hypertension; Coronary artery disease; and PAF (paroxysmal atrial fibrillation). Also  has past surgical history that includes Exploration post operative open heart; Urinary surgery; Ankle surgery; Knee surgery; Back surgery; Finger surgery; Coronary artery bypass graft (2004); Tonsillectomy; Colonoscopy; Hernia repair (Right); Cholecystectomy (N/A, 12/07/2013); Joint replacement; and Prostate surgery.  Objective:   Vitals: BP 122/58 mmHg  Pulse 54  Temp(Src) 98.3 F (36.8 C) (Oral)  Resp 18  Ht 6' 2.5" (1.892 m)  Wt 225 lb (102.059 kg)  BMI 28.51 kg/m2  SpO2 98%  Physical Exam  Constitutional: He is oriented to person, place, and time. He appears well-developed and well-nourished.  Cardiovascular: Normal rate.   Pulmonary/Chest: Effort normal.  Neurological: He is alert and oriented to person, place, and time.  Skin: Skin is warm and dry. No rash noted. No erythema. No pallor.      WOUND CARE: Abscess s/p I&D Packing removed with minimal amount of purulent material. Wound irrigated with ~30cc of sterile water, explored for loculations. Wound was  not repacked given depth of ~1cm. Cleansed and dressed.  Assessment and Plan :   1. Abscess of left thigh 2. MRSA cellulitis - Significantly improved, continue doxycycline, anticipatory guidance provided. RTC if wound does not heal.  Jaynee Eagles, PA-C [facility] [phone] 07/15/2015 12:03 PM

## 2015-07-16 DIAGNOSIS — L02416 Cutaneous abscess of left lower limb: Secondary | ICD-10-CM | POA: Insufficient documentation

## 2015-07-17 ENCOUNTER — Ambulatory Visit (INDEPENDENT_AMBULATORY_CARE_PROVIDER_SITE_OTHER): Payer: Medicare Other | Admitting: *Deleted

## 2015-07-17 DIAGNOSIS — I4891 Unspecified atrial fibrillation: Secondary | ICD-10-CM | POA: Diagnosis not present

## 2015-07-17 DIAGNOSIS — Z7901 Long term (current) use of anticoagulants: Secondary | ICD-10-CM | POA: Diagnosis not present

## 2015-07-17 LAB — POCT INR: INR: 3.4

## 2015-07-29 ENCOUNTER — Ambulatory Visit (INDEPENDENT_AMBULATORY_CARE_PROVIDER_SITE_OTHER): Payer: Medicare Other | Admitting: Pharmacist

## 2015-07-29 DIAGNOSIS — Z7901 Long term (current) use of anticoagulants: Secondary | ICD-10-CM

## 2015-07-29 DIAGNOSIS — I4891 Unspecified atrial fibrillation: Secondary | ICD-10-CM

## 2015-07-29 LAB — POCT INR: INR: 3.3

## 2015-08-02 DIAGNOSIS — L570 Actinic keratosis: Secondary | ICD-10-CM | POA: Diagnosis not present

## 2015-08-02 DIAGNOSIS — L578 Other skin changes due to chronic exposure to nonionizing radiation: Secondary | ICD-10-CM | POA: Diagnosis not present

## 2015-08-02 DIAGNOSIS — L821 Other seborrheic keratosis: Secondary | ICD-10-CM | POA: Diagnosis not present

## 2015-08-06 ENCOUNTER — Ambulatory Visit (INDEPENDENT_AMBULATORY_CARE_PROVIDER_SITE_OTHER): Payer: Medicare Other | Admitting: Family Medicine

## 2015-08-06 ENCOUNTER — Encounter: Payer: Self-pay | Admitting: Family Medicine

## 2015-08-06 VITALS — BP 143/73 | HR 53 | Temp 97.8°F | Ht 75.0 in | Wt 225.0 lb

## 2015-08-06 DIAGNOSIS — R220 Localized swelling, mass and lump, head: Secondary | ICD-10-CM | POA: Diagnosis not present

## 2015-08-06 DIAGNOSIS — N400 Enlarged prostate without lower urinary tract symptoms: Secondary | ICD-10-CM

## 2015-08-06 DIAGNOSIS — I6523 Occlusion and stenosis of bilateral carotid arteries: Secondary | ICD-10-CM

## 2015-08-06 NOTE — Progress Notes (Signed)
   Subjective:   Thomas Marshall is a 74 y.o. male with a history of AAA, PAD, carotid stenosis, HTN, T2DM, BPH, CKD 3 here for R facial swelling, urology referral.  Swelling on R side of face - under R ear on corner of jaw - off and on for ~71yr - will go down on its own - not painful - no difficulty swallowing, no trismus, no night sweats or fevers  BPH: - previously followed by urology with 2 previous procedures for BPH per report - wants referral to urology for prostate - Denies any active complaints  Review of Systems:  Per HPI. All other systems reviewed and are negative.   PMH, PSH, Medications, Allergies, and FmHx reviewed and updated in EMR.  Social History: former smoker - quit in 1972  Objective:  BP 143/73 mmHg  Pulse 53  Temp(Src) 97.8 F (36.6 C) (Oral)  Ht 6\' 3"  (1.905 m)  Wt 225 lb (102.059 kg)  BMI 28.12 kg/m2  Gen:  74 y.o. male in NAD HEENT: NCAT, MMM, EOMI, PERRL, anicteric sclerae, mild swelling over R angle of mandible, no TTP, TMs clear b/l CV: RRR, no MRG Resp: Non-labored, CTAB, no wheezes noted Ext: WWP, no edema Neuro: Alert and oriented, speech normal     Assessment & Plan:     Thomas Marshall is a 74 y.o. male here for R facial swelling, BPH.  BPH (benign prostatic hypertrophy) Referral to urology per patient request  Right facial swelling Likely intermittent salivary stone Apparently up to 30% of patients have nonpainful swelling with salivary stones Given that swelling is intermittent and lack of night sweats or fevers, concern for cancer is lower We'll get CT maxillofacial without contrast to further evaluate Consider ENT referral    Virginia Crews, MD MPH PGY-2,  [facility] 08/06/2015  11:46 AM

## 2015-08-06 NOTE — Assessment & Plan Note (Signed)
Likely intermittent salivary stone Apparently up to 30% of patients have nonpainful swelling with salivary stones Given that swelling is intermittent and lack of night sweats or fevers, concern for cancer is lower We'll get CT maxillofacial without contrast to further evaluate Consider ENT referral

## 2015-08-06 NOTE — Patient Instructions (Signed)
Nice to meet you today. I will refer you to her urologist your prostate.  For your facial swelling, I wonder if this is a stone in one of your salivary glands. We will get a CT of your face to further evaluate this. I will let you know the results when they're available.  Take care,  Dr. Jacinto Reap

## 2015-08-06 NOTE — Assessment & Plan Note (Signed)
Referral to urology per patient request

## 2015-08-12 ENCOUNTER — Ambulatory Visit: Payer: Medicare Other | Admitting: Cardiovascular Disease

## 2015-08-13 ENCOUNTER — Ambulatory Visit (INDEPENDENT_AMBULATORY_CARE_PROVIDER_SITE_OTHER): Payer: Medicare Other | Admitting: *Deleted

## 2015-08-13 ENCOUNTER — Telehealth: Payer: Self-pay | Admitting: Family Medicine

## 2015-08-13 ENCOUNTER — Ambulatory Visit (HOSPITAL_COMMUNITY)
Admission: RE | Admit: 2015-08-13 | Discharge: 2015-08-13 | Disposition: A | Discharge status: Home / Self Care | Payer: Medicare Other | Source: Ambulatory Visit | Attending: Family Medicine | Admitting: Family Medicine

## 2015-08-13 ENCOUNTER — Encounter (HOSPITAL_COMMUNITY): Payer: Self-pay

## 2015-08-13 DIAGNOSIS — R220 Localized swelling, mass and lump, head: Secondary | ICD-10-CM | POA: Diagnosis not present

## 2015-08-13 DIAGNOSIS — Z23 Encounter for immunization: Secondary | ICD-10-CM

## 2015-08-13 DIAGNOSIS — I4891 Unspecified atrial fibrillation: Secondary | ICD-10-CM

## 2015-08-13 DIAGNOSIS — Z7901 Long term (current) use of anticoagulants: Secondary | ICD-10-CM

## 2015-08-13 LAB — POCT INR: INR: 2.3

## 2015-08-13 IMAGING — CT CT MAXILLOFACIAL W/O CM
2 of 4 series · 8 of 47 positions shown, 10 images · non-contrast
Comparison: None.

CLINICAL DATA: Right-sided facial swelling.

EXAM:
CT MAXILLOFACIAL WITHOUT CONTRAST
TECHNIQUE: Multidetector CT imaging of the maxillofacial structures was
performed. Multiplanar CT image reconstructions were also generated.
A small metallic BB was placed on the right temple in order to
reliably differentiate right from left.

[Series 205: cor bone 2x2 · coronal · 0.40mm/px · 3 of 95 slices shown]
[im 24/95  bone]
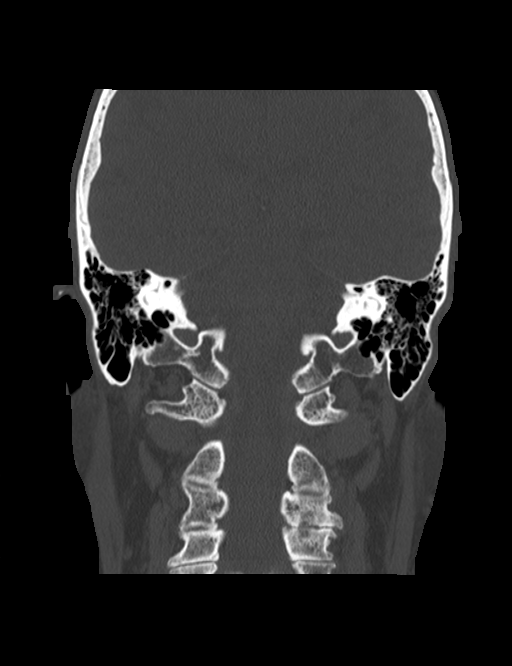
[im 48/95  bone]
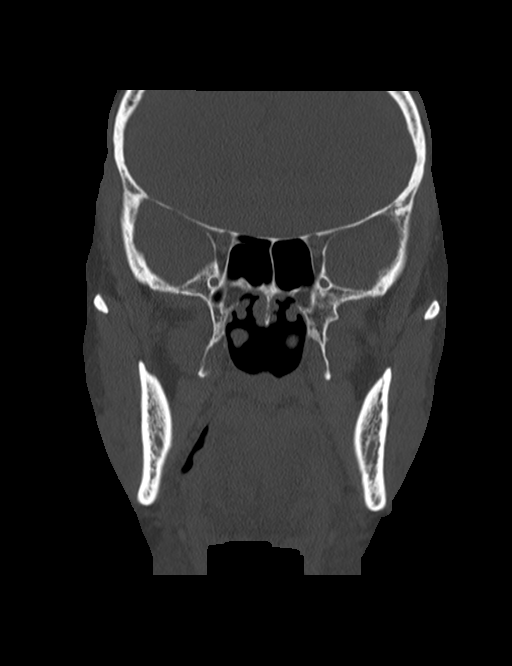
[im 71/95  bone]
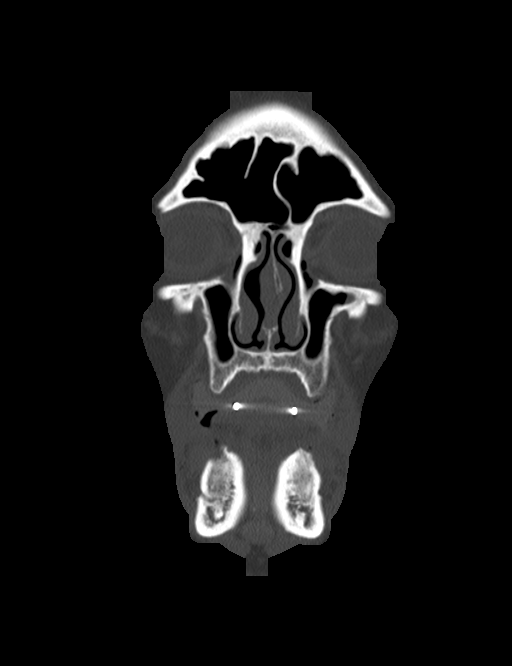

[Series 209: sag st 2x2 · sagittal · 0.40mm/px · 5 of 88 slices shown, 7 images]
[im 15/88  brain]
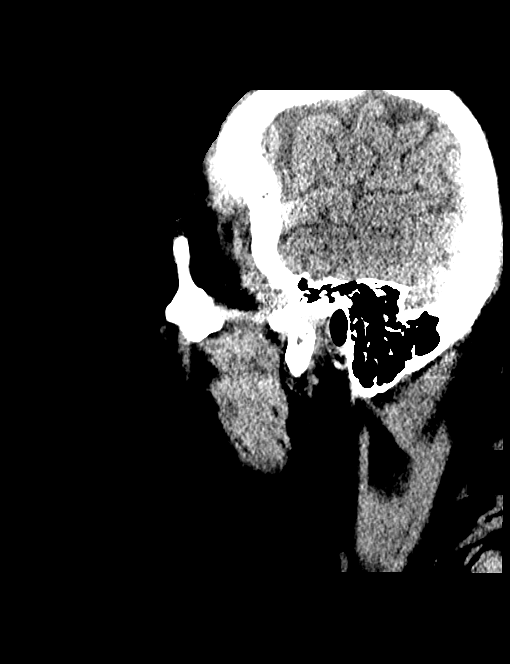
[im 15/88  bone]
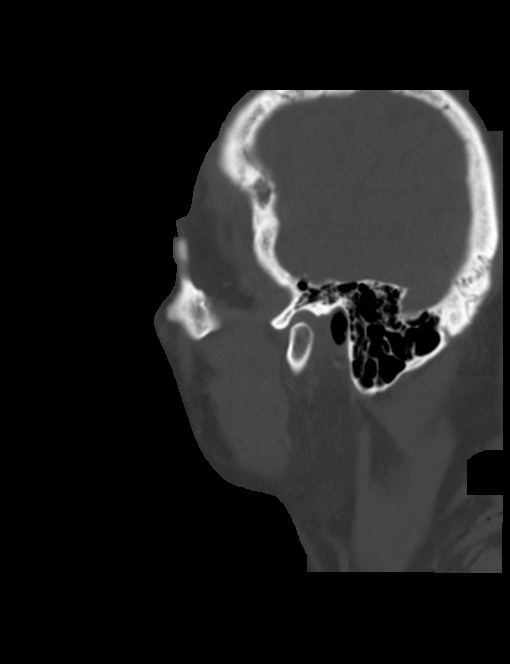
[im 30/88  bone]
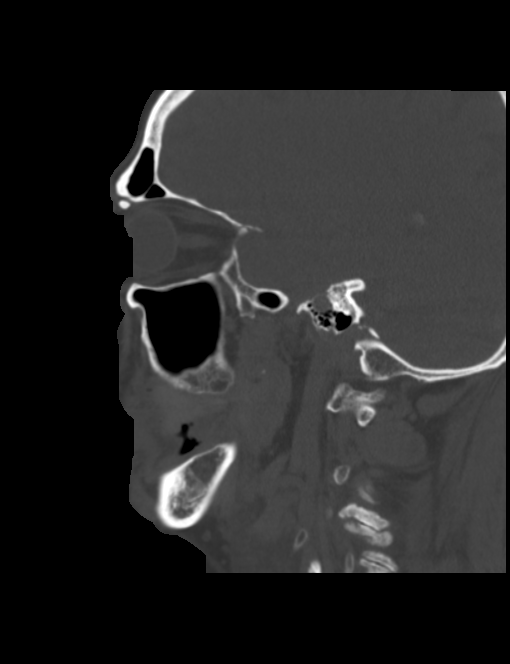
[im 44/88  bone]
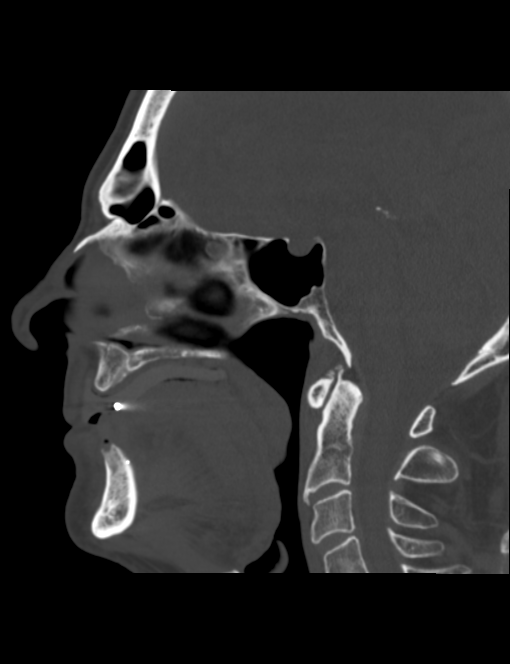
[im 59/88  bone]
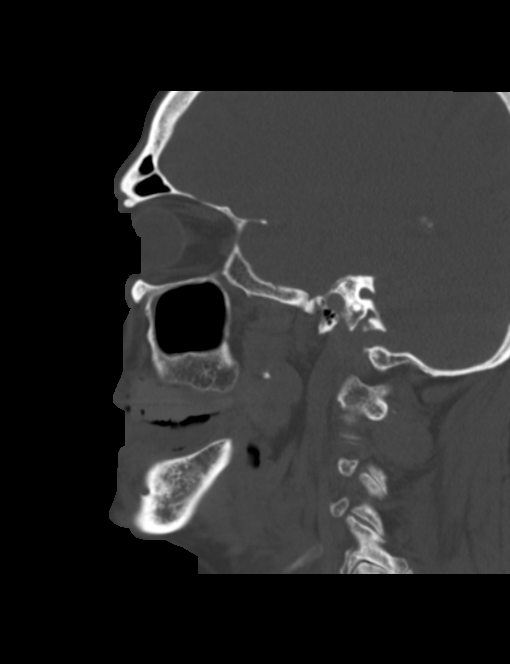
[im 73/88  brain]
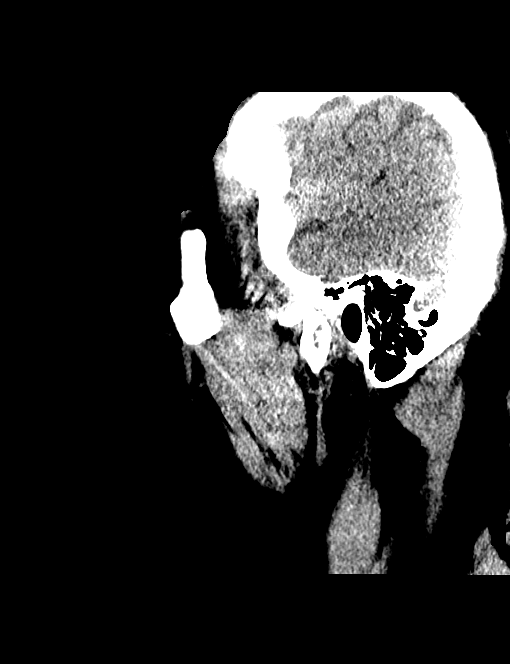
[im 73/88  bone]
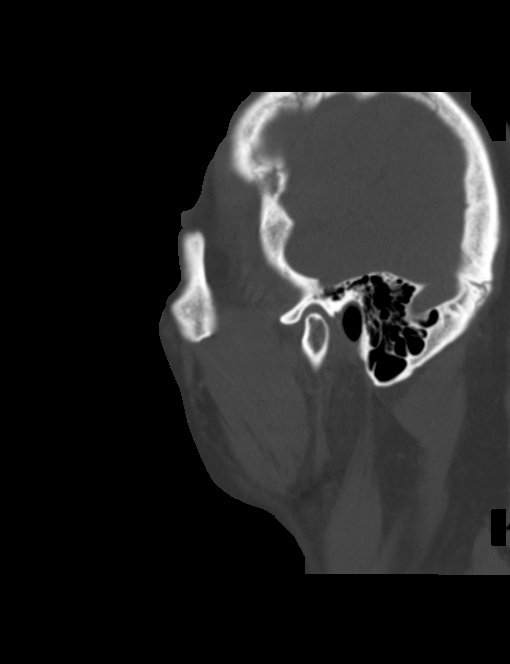

[8 of 47 positions shown; findings below may reference images not displayed]

FINDINGS: No fracture or other bony abnormality is noted. Pterygoid plates
appear normal. Paranasal sinuses appear normal. Globes and orbits
appear normal. Parotid and submandibular glands appear normal. There
is no evidence of salivary duct stones. Extensive vascular
calcifications are seen involving the carotid arteries.
IMPRESSION: No evidence of sinusitis. No evidence of salivary duct stone.
Extensive carotid artery calcifications are noted bilaterally;
carotid ultrasound is recommended for further evaluation.

## 2015-08-13 NOTE — Telephone Encounter (Signed)
Review CT maxillofacial results. Call discussed results with patient. No lymphadenopathy or salivary stone noted. Likely that swelling was related to salivary gland stone that has since passed. Patient reports no swelling currently.  Bilateral carotid plaques noted on CT. Patient is followed by vascular surgery with regular carotid duplex scans. He is due to see them again in December.  Virginia Crews, MD, MPH PGY-2,  [facility] 08/13/2015 10:50 AM

## 2015-08-26 DIAGNOSIS — M7542 Impingement syndrome of left shoulder: Secondary | ICD-10-CM | POA: Diagnosis not present

## 2015-08-26 DIAGNOSIS — M25512 Pain in left shoulder: Secondary | ICD-10-CM | POA: Diagnosis not present

## 2015-09-03 ENCOUNTER — Ambulatory Visit (INDEPENDENT_AMBULATORY_CARE_PROVIDER_SITE_OTHER): Payer: Medicare Other | Admitting: *Deleted

## 2015-09-03 DIAGNOSIS — I4891 Unspecified atrial fibrillation: Secondary | ICD-10-CM

## 2015-09-03 DIAGNOSIS — Z7901 Long term (current) use of anticoagulants: Secondary | ICD-10-CM | POA: Diagnosis not present

## 2015-09-03 LAB — POCT INR: INR: 2

## 2015-09-24 DIAGNOSIS — N400 Enlarged prostate without lower urinary tract symptoms: Secondary | ICD-10-CM | POA: Diagnosis not present

## 2015-10-01 ENCOUNTER — Ambulatory Visit (INDEPENDENT_AMBULATORY_CARE_PROVIDER_SITE_OTHER): Payer: Medicare Other | Admitting: *Deleted

## 2015-10-01 DIAGNOSIS — I4891 Unspecified atrial fibrillation: Secondary | ICD-10-CM | POA: Diagnosis not present

## 2015-10-01 DIAGNOSIS — Z7901 Long term (current) use of anticoagulants: Secondary | ICD-10-CM | POA: Diagnosis not present

## 2015-10-01 LAB — POCT INR: INR: 2.2

## 2015-10-09 ENCOUNTER — Ambulatory Visit: Payer: Medicare Other | Admitting: Podiatry

## 2015-10-09 ENCOUNTER — Encounter: Payer: Self-pay | Admitting: Podiatry

## 2015-10-09 ENCOUNTER — Ambulatory Visit (INDEPENDENT_AMBULATORY_CARE_PROVIDER_SITE_OTHER): Payer: Medicare Other | Admitting: Podiatry

## 2015-10-09 DIAGNOSIS — M79676 Pain in unspecified toe(s): Secondary | ICD-10-CM

## 2015-10-09 DIAGNOSIS — E119 Type 2 diabetes mellitus without complications: Secondary | ICD-10-CM

## 2015-10-09 DIAGNOSIS — B351 Tinea unguium: Secondary | ICD-10-CM | POA: Diagnosis not present

## 2015-10-09 NOTE — Progress Notes (Signed)
Subjective:  Thomas Marshall presents today for follow up of diabetic foot and nail care. His callus on the right foot is painful and symptomaticHe presents for preventive foot care services.  He also checks on new diabetic shoes at this visit.  Objective:   Physical Exam  GENERAL APPEARANCE: Alert, conversant. Appropriately groomed. No acute distress.  VASCULAR: Pedal pulses palpable at 1/4 DP and PT bilateral. Capillary refill time is immediate to all digits, Proximal to distal cooling it warm to warm. Digital hair growth is present bilateral  NEUROLOGIC: sensation is intact epicritically and protectively to 5.07 monofilament at 5/5 sites bilateral. Light touch is intact bilateral, vibratory sensation intact bilateral, achilles tendon reflex is intact bilateral.  MUSCULOSKELETAL: Prominent fifth metatarsal plantarly  right is noted. Left is within acceptable limits. Otherwise rectus foot type is seen. Intrinsic musculature is intact. No Digital deformity is present HAV 1st MPJ B/L and tailors bunion. Arthrodesis right ankle. DERMATOLOGIC: toenails are elongated, thickened, discolored, dystrophic x10. Hyperkeratotic callus present at the fifth metatarsal base right. Otherwise skin color, texture, and turger are within normal limits. No preulcerative lesions are seen, no interdigital maceration noted. No open lesions present.   Assessment:  Symptomatic mycotic toenails, callus right foot  Plan:  Debrided nails and callus with out complication.  He will be seen back prn.

## 2015-10-15 DIAGNOSIS — L578 Other skin changes due to chronic exposure to nonionizing radiation: Secondary | ICD-10-CM | POA: Diagnosis not present

## 2015-10-15 DIAGNOSIS — L821 Other seborrheic keratosis: Secondary | ICD-10-CM | POA: Diagnosis not present

## 2015-10-15 DIAGNOSIS — L570 Actinic keratosis: Secondary | ICD-10-CM | POA: Diagnosis not present

## 2015-10-22 ENCOUNTER — Other Ambulatory Visit: Payer: Self-pay | Admitting: Cardiovascular Disease

## 2015-10-22 ENCOUNTER — Other Ambulatory Visit: Payer: Self-pay | Admitting: Family Medicine

## 2015-10-23 ENCOUNTER — Other Ambulatory Visit: Payer: Self-pay | Admitting: *Deleted

## 2015-10-23 MED ORDER — METFORMIN HCL 1000 MG PO TABS: 1000.0000 mg | ORAL_TABLET | Freq: Every day | ORAL | Status: DC

## 2015-10-23 NOTE — Telephone Encounter (Signed)
Patient is out of medication and requesting to send refill today please.  Derl Barrow, RN

## 2015-10-23 NOTE — Telephone Encounter (Signed)
What medications? I have refilled his metformin already this AM.  Thanks, Archie Patten, MD [facility] Resident  10/23/2015, 1:18 PM

## 2015-11-08 ENCOUNTER — Encounter: Payer: Self-pay | Admitting: Vascular Surgery

## 2015-11-11 ENCOUNTER — Ambulatory Visit (INDEPENDENT_AMBULATORY_CARE_PROVIDER_SITE_OTHER): Payer: Medicare Other | Admitting: *Deleted

## 2015-11-11 DIAGNOSIS — Z7901 Long term (current) use of anticoagulants: Secondary | ICD-10-CM | POA: Diagnosis not present

## 2015-11-11 DIAGNOSIS — I4891 Unspecified atrial fibrillation: Secondary | ICD-10-CM | POA: Diagnosis not present

## 2015-11-11 LAB — POCT INR: INR: 2.3

## 2015-11-15 ENCOUNTER — Ambulatory Visit (INDEPENDENT_AMBULATORY_CARE_PROVIDER_SITE_OTHER)
Admission: RE | Admit: 2015-11-15 | Discharge: 2015-11-15 | Disposition: A | Discharge status: Home / Self Care | Payer: Medicare Other | Source: Ambulatory Visit | Attending: Vascular Surgery | Admitting: Vascular Surgery

## 2015-11-15 ENCOUNTER — Encounter: Payer: Self-pay | Admitting: Vascular Surgery

## 2015-11-15 ENCOUNTER — Ambulatory Visit (INDEPENDENT_AMBULATORY_CARE_PROVIDER_SITE_OTHER): Payer: Medicare Other | Admitting: Vascular Surgery

## 2015-11-15 ENCOUNTER — Ambulatory Visit (HOSPITAL_COMMUNITY)
Admission: RE | Admit: 2015-11-15 | Discharge: 2015-11-15 | Disposition: A | Discharge status: Home / Self Care | Payer: Medicare Other | Source: Ambulatory Visit | Attending: Vascular Surgery | Admitting: Vascular Surgery

## 2015-11-15 VITALS — BP 147/73 | HR 93 | Temp 98.0°F | Ht 74.0 in | Wt 228.0 lb

## 2015-11-15 DIAGNOSIS — I6523 Occlusion and stenosis of bilateral carotid arteries: Secondary | ICD-10-CM

## 2015-11-15 DIAGNOSIS — I10 Essential (primary) hypertension: Secondary | ICD-10-CM | POA: Diagnosis not present

## 2015-11-15 DIAGNOSIS — I714 Abdominal aortic aneurysm, without rupture, unspecified: Secondary | ICD-10-CM

## 2015-11-15 DIAGNOSIS — E119 Type 2 diabetes mellitus without complications: Secondary | ICD-10-CM | POA: Diagnosis not present

## 2015-11-15 DIAGNOSIS — I739 Peripheral vascular disease, unspecified: Principal | ICD-10-CM

## 2015-11-15 DIAGNOSIS — I779 Disorder of arteries and arterioles, unspecified: Secondary | ICD-10-CM | POA: Diagnosis not present

## 2015-11-15 NOTE — Progress Notes (Signed)
Established Carotid Patient  History of Present Illness  Thomas Marshall is a 74 y.o. (December 03, 1940) male who presents with chief complaint: routine follow up.  Previous carotid studies demonstrated: RICA 60-79% (high) stenosis, LICA [id number] stenosis.  Patient has no history of TIA or stroke symptom.  The patient has never had amaurosis fugax or monocular blindness.  The patient has never had facial drooping or hemiplegia.  The patient has never had receptive or expressive aphasia.    Past Medical History  Diagnosis Date  . Cancer (HCC)     skin & squamous cell  . Diabetes mellitus without complication ([place])   . GERD (gastroesophageal reflux disease)   . Hyperlipidemia   . Hypertension   . Coronary artery disease   . PAF (paroxysmal atrial fibrillation) [facility])     Past Surgical History  Procedure Laterality Date  . Exploration post operative open heart    . Urinary surgery      scar tissue  . Ankle surgery      right fused  . Knee surgery      replacement on both knees  . Back surgery      low back   . Finger surgery      skin graft on rt index  . Coronary artery bypass graft  2004    [place]  . Tonsillectomy    . Colonoscopy    . Hernia repair Right     [facility]  . Cholecystectomy N/A 12/07/2013    Procedure: LAPAROSCOPIC CHOLECYSTECTOMY ;  Surgeon: [name], MD;  Location: [place];  Service: General;  Laterality: N/A;  . Joint replacement    . Prostate surgery      Social History   Social History  . Marital Status: Married    Spouse Name: N/A  . Number of Children: N/A  . Years of Education: N/A   Occupational History  . Not on file.   Social History Main Topics  . Smoking status: Former Smoker -- 1.00 packs/day for 13 years    Quit date: 11/30/1970  . Smokeless tobacco: Never Used  . Alcohol Use: No  . Drug Use: No  . Sexual Activity: Yes   Other Topics Concern  . Not on file   Social History Narrative    Family History  Problem Relation Age of  Onset  . Stroke Mother   . Heart disease Mother   . Heart disease Father   . Hypertension Father   . Hyperlipidemia Father   . Cancer Sister     breast  and skin  . Diabetes Sister   . Hypertension Sister   . Diabetes Brother   . Heart disease Brother   . Hyperlipidemia Brother   . Hypertension Brother   . Early death Neg Hx     Current Outpatient Prescriptions  Medication Sig Dispense Refill  . aspirin 81 MG tablet Take 81 mg by mouth daily.    [name] atorvastatin (LIPITOR) 40 MG tablet Take 40 mg by mouth daily.    . cholecalciferol (VITAMIN D) 1000 UNITS tablet Take 1,000 Units by mouth daily.    . cloNIDine (CATAPRES) 0.1 MG tablet Take 1 tablet (0.1 mg total) by mouth 2 (two) times daily. 180 tablet 3  . metFORMIN (GLUCOPHAGE) 1000 MG tablet Take 1 tablet (1,000 mg total) by mouth daily after supper. 30 tablet 1  . metFORMIN (GLUCOPHAGE) 500 MG tablet TAKE TWO TABLETS BY MOUTH TWICE DAILY WITH A MEAL 60 tablet 0  .  nitroGLYCERIN (NITROSTAT) 0.4 MG SL tablet Place 1 tablet (0.4 mg total) under the tongue every 5 (five) minutes as needed for chest pain. 25 tablet 3  . omeprazole (PRILOSEC) 20 MG capsule Take 20 mg by mouth daily.    [name] spironolactone (ALDACTONE) 25 MG tablet TAKE ONE TABLET BY MOUTH ONCE DAILY 90 tablet 1  . vitamin C (ASCORBIC ACID) 500 MG tablet Take 500 mg by mouth daily.    . vitamin E 400 UNIT capsule Take 400 Units by mouth daily.    [name] warfarin (COUMADIN) 10 MG tablet Take 1 tablet (10 mg total) by mouth as directed. 100 tablet 1  . doxycycline (VIBRAMYCIN) 100 MG capsule Take 1 capsule (100 mg total) by mouth 2 (two) times daily. (Patient not taking: Reported on 11/15/2015) 20 capsule 0  . mupirocin ointment (BACTROBAN) 2 % Place 1 application into the nose 2 (two) times daily. (Patient not taking: Reported on 11/15/2015) 22 g 0   No current facility-administered medications for this visit.     Allergies  Allergen Reactions  . Carvedilol     Low heart  rate   . Lisinopril Swelling    Angioedema.   . Losartan Other (See Comments)    Angioedema   . Nifedipine Swelling  . Triamterene Swelling  . Amoxicillin Rash  . Ampicillin Rash     REVIEW OF SYSTEMS:  (Positives checked otherwise negative)  CARDIOVASCULAR:   [ ]  chest pain,  [ ]  chest pressure,  [x]  prior history of CABG [x]  prior cardiac arrhythmia [ ]  shortness of breath when laying flat,  [ ]  shortness of breath with exertion,   [ ]  pain in feet when walking,  [ ]  pain in feet when laying flat, [ ]  history of blood clot in veins (DVT),  [ ]  history of phlebitis,  [ ]  swelling in legs,  [ ]  varicose veins  PULMONARY:   [ ]  productive cough,  [ ]  asthma,  [ ]  wheezing  NEUROLOGIC:   [ ]  weakness in arms or legs,  [ ]  numbness in arms or legs,  [ ]  difficulty speaking or slurred speech,  [ ]  temporary loss of vision in one eye,  [ ]  dizziness  HEMATOLOGIC:   [ ]  bleeding problems,  [ ]  problems with blood clotting too easily  MUSCULOSKEL:   [ ]  joint pain, [ ]  joint swelling  GASTROINTEST:   [ ]  vomiting blood,  [ ]  blood in stool     GENITOURINARY:   [ ]  burning with urination,  [ ]  blood in urine  PSYCHIATRIC:   [ ]  history of major depression  INTEGUMENTARY:   [ ]  rashes,  [ ]  ulcers  CONSTITUTIONAL:   [ ]  fever,  [ ]  chills    Physical Examination  Filed Vitals:   11/15/15 0950 11/15/15 0953 11/15/15 0955  BP: 159/78 144/73 147/73  Pulse: 97 90 93  Temp: 98 F (36.7 C)    Height: 6\' 2"  (1.88 m)    Weight: 228 lb (103.42 kg)    SpO2: 100%     Body mass index is 29.26 kg/(m^2).  General: A&O x 3, WDWN  Eyes: PERRLA, EOMI  Neck: Supple, no nuchal rigidity, no palpable LAD  Pulmonary: Sym exp, good air movt, CTAB, no rales, rhonchi, & wheezing  Cardiac: RRR, Nl S1, S2, no Murmurs, rubs or gallops  Vascular: Vessel Right Left  Radial Palpable Palpable  Brachial Palpable Palpable  Carotid Palpable, without bruit  Palpable, without bruit  Aorta Not palpable N/A  Femoral Palpable Palpable  Popliteal Not palpable Not palpable  PT Palpable Palpable  DP Palpable Palpable   Gastrointestinal: soft, NTND, no G/R, no HSM, no masses, no CVAT B  Musculoskeletal: M/S 5/5 throughout , Extremities without ischemic changes ,   Neurologic: CN 2-12 intact , Pain and light touch intact in extremities , Motor exam as listed above   Non-Invasive Vascular Imaging  CAROTID DUPLEX (Date: 11/15/2015 ):   R ICA stenosis: >80%  R VA: patent and antegrade  L ICA stenosis: 40-59%  L VA: patent and antegrade  ABI (Date: 11/15/2015)  R:   ABI: 1.17,   DP: tri,   PT: tri,   TBI: 0.70  L:   ABI: 1.07,   DP: tri,   PT: tri,   TBI: 0.75   Medical Decision Making  Thomas Marshall is a 74 y.o. male who presents with: asx R ICA stenosis >80%., asx L ICA <80%   Based on the patient's vascular studies and examination, I have offered the patient: R CEA. I discussed with the patient the risks, benefits, and alternatives to carotid endarterectomy.   The patient is a candidate for carotid artery stenting. If his cardiac risk stratification is not high; however, would not offer CAS as there is nearly a 3-4 times higher stroke rate. I discussed the procedural details of carotid endarterectomy with the patient.   The patient is aware that the risks of carotid endarterectomy include but are not limited to: bleeding, infection, stroke, myocardial infarction, death, cranial nerve injuries both temporary and permanent, neck hematoma, possible airway compromise, labile blood pressure post-operatively, cerebral hyperperfusion syndrome, and possible need for additional interventions in the future.  The patient is aware of the risks and agrees to proceed forward with the procedure. Due to prior history of CABG and cardiac arrhythmia, will need cardiology clearance prior to proceeding as likely to get blocked by  Anesthesia.  I discussed in depth with the patient the nature of atherosclerosis, and emphasized the importance of maximal medical management including strict control of blood pressure, blood glucose, and lipid levels, antiplatelet agents, obtaining regular exercise, and cessation of smoking.    The patient is aware that without maximal medical management the underlying atherosclerotic disease process will progress, limiting the benefit of any interventions. The patient is currently on a statin: Lipitor. The patient is currently on an anti-platelet: ASA.  Thank you for allowing [place] to participate in this patient's care.   Adele Barthel, MD [facility] Office: [phone] Pager: [phone]  11/15/2015, 12:02 PM

## 2015-11-18 ENCOUNTER — Ambulatory Visit (INDEPENDENT_AMBULATORY_CARE_PROVIDER_SITE_OTHER): Payer: Medicare Other | Admitting: Cardiovascular Disease

## 2015-11-18 ENCOUNTER — Encounter: Payer: Self-pay | Admitting: Cardiovascular Disease

## 2015-11-18 VITALS — BP 132/66 | HR 68 | Ht 74.0 in | Wt 229.4 lb

## 2015-11-18 DIAGNOSIS — I10 Essential (primary) hypertension: Secondary | ICD-10-CM

## 2015-11-18 DIAGNOSIS — E785 Hyperlipidemia, unspecified: Secondary | ICD-10-CM | POA: Diagnosis not present

## 2015-11-18 DIAGNOSIS — I6523 Occlusion and stenosis of bilateral carotid arteries: Secondary | ICD-10-CM | POA: Diagnosis not present

## 2015-11-18 DIAGNOSIS — I480 Paroxysmal atrial fibrillation: Secondary | ICD-10-CM | POA: Diagnosis not present

## 2015-11-18 NOTE — Patient Instructions (Signed)
Medication Instructions:  Your physician recommends that you continue on your current medications as directed. Please refer to the Current Medication list given to you today.  Labwork: No new orders.   Testing/Procedures: No new orders.   Follow-Up: Your physician wants you to follow-up in: 6 MONTHS with Dr Burt Knack.  You will receive a reminder letter in the mail two months in advance. If you don't receive a letter, please call our office to schedule the follow-up appointment.   Any Other Special Instructions Will Be Listed Below (If Applicable).  You are cleared from a cardiac standpoint for carotid surgery.      If you need a refill on your cardiac medications before your next appointment, please call your pharmacy.

## 2015-11-18 NOTE — Progress Notes (Signed)
Cardiology Office Note Date:  11/18/2015   ID:  Keiser, Latimer [birth year], MRN [id number]  PCP:  Lavon Paganini, MD  Cardiologist:  [name], MD    Chief Complaint  Patient presents with  . Pre-op Exam   History of Present Illness: ARLYN JESSUP is a 74 y.o. male who presents for pre-operative evaluation. The patient has coronary artery disease status post four-vessel CABG in 2004. Other chronic cardiac problems include hypertension, hyperlipidemia, and paroxysmal atrial fibrillation treated with amiodarone and anticoagulated with warfarin. The patient also has a small abdominal aortic aneurysm measuring 3.2 cm in maximal diameter from a duplex study in 2015 (unchanged from 2014 study).   He has been diagnosed with severe right ICA stenosis and CEA has been recommended. He is here today for preoperative cardiovascular evaluation.   From a cardiac perspective he is doing well. He's been out raking leaves this Fall without symptoms related to exertion. Today, he denies symptoms of palpitations, chest pain, shortness of breath, orthopnea, PND, lower extremity edema, dizziness, or syncope. No bleeding problems on warfarin.  Past Medical History  Diagnosis Date  . Cancer (HCC)     skin & squamous cell  . Diabetes mellitus without complication ([place])   . GERD (gastroesophageal reflux disease)   . Hyperlipidemia   . Hypertension   . Coronary artery disease   . PAF (paroxysmal atrial fibrillation) [facility])     Past Surgical History  Procedure Laterality Date  . Exploration post operative open heart    . Urinary surgery      scar tissue  . Ankle surgery      right fused  . Knee surgery      replacement on both knees  . Back surgery      low back   . Finger surgery      skin graft on rt index  . Coronary artery bypass graft  2004    [place]  . Tonsillectomy    . Colonoscopy    . Hernia repair Right     [facility]  . Cholecystectomy N/A 12/07/2013    Procedure:  LAPAROSCOPIC CHOLECYSTECTOMY ;  Surgeon: [name], MD;  Location: [facility];  Service: General;  Laterality: N/A;  . Joint replacement    . Prostate surgery      Current Outpatient Prescriptions  Medication Sig Dispense Refill  . aspirin 81 MG tablet Take 81 mg by mouth daily.    [name] atorvastatin (LIPITOR) 40 MG tablet Take 40 mg by mouth daily.    . cholecalciferol (VITAMIN D) 1000 UNITS tablet Take 1,000 Units by mouth daily.    . cloNIDine (CATAPRES) 0.1 MG tablet Take 1 tablet (0.1 mg total) by mouth 2 (two) times daily. 180 tablet 3  . metFORMIN (GLUCOPHAGE) 1000 MG tablet Take 1 tablet (1,000 mg total) by mouth daily after supper. 30 tablet 1  . nitroGLYCERIN (NITROSTAT) 0.4 MG SL tablet Place 1 tablet (0.4 mg total) under the tongue every 5 (five) minutes as needed for chest pain. 25 tablet 3  . omeprazole (PRILOSEC) 20 MG capsule Take 20 mg by mouth daily.    [name] spironolactone (ALDACTONE) 25 MG tablet TAKE ONE TABLET BY MOUTH ONCE DAILY 90 tablet 1  . vitamin C (ASCORBIC ACID) 500 MG tablet Take 500 mg by mouth daily.    . vitamin E 400 UNIT capsule Take 400 Units by mouth daily.    [name] warfarin (COUMADIN) 10 MG tablet Take 1 tablet (10 mg  total) by mouth as directed. 100 tablet 1   No current facility-administered medications for this visit.    Allergies:   Carvedilol; Lisinopril; Losartan; Nifedipine; Triamterene; Amoxicillin; and Ampicillin   Social History:  The patient  reports that he quit smoking about 44 years ago. He has never used smokeless tobacco. He reports that he does not drink alcohol or use illicit drugs.   Family History:  The patient's  family history includes Cancer in his sister; Diabetes in his brother and sister; Heart disease in his brother, father, and mother; Hyperlipidemia in his brother and father; Hypertension in his brother, father, and sister; Stroke in his mother. There is no history of Early death.    ROS:  Please see the history of present  illness.  All other systems are reviewed and negative.    PHYSICAL EXAM: VS:  BP 132/66 mmHg  Pulse 68  Ht 6\' 2"  (1.88 m)  Wt 229 lb 6.4 oz (104.055 kg)  BMI 29.44 kg/m2 , BMI Body mass index is 29.44 kg/(m^2). GEN: Well nourished, well developed, in no acute distress HEENT: normal Neck: no JVD, no masses. Bilateral carotid bruits Cardiac: RRR with 2/6 SEM at the RUSB              Respiratory:  clear to auscultation bilaterally, normal work of breathing GI: soft, nontender, nondistended, + BS MS: no deformity or atrophy Ext: no pretibial edema, pedal pulses 2+= bilaterally Skin: warm and dry, no rash Neuro:  Strength and sensation are intact Psych: euthymic mood, full affect  EKG:  EKG is ordered today. The ekg ordered today shows Sinus rhythm 68 bpm, PAC's, otherwise within normal limits  Recent Labs: 05/13/2015: BUN 29*; Creat 1.42*; Platelets 142*; Potassium 3.9; Sodium 140 07/09/2015: Hemoglobin 11.8*   Lipid Panel     Component Value Date/Time   CHOL 146 05/13/2015 1635   TRIG 245* 05/13/2015 1635   HDL 34* 05/13/2015 1635   CHOLHDL 4.3 05/13/2015 1635   VLDL 49* 05/13/2015 1635   LDLCALC 63 05/13/2015 1635      Wt Readings from Last 3 Encounters:  11/18/15 229 lb 6.4 oz (104.055 kg)  11/15/15 228 lb (103.42 kg)  08/06/15 225 lb (102.059 kg)    ASSESSMENT AND PLAN: 1.  Coronary artery disease, native vessel, without symptoms of angina. Patient appears stable from a cardiac perspective. He is now 11 years out from CABG with his most recent stress test approximately one year ago demonstrating no ischemia. Will continue with his medical program. I will see him back in 6 months.  2. Carotid stenosis.  The patient has been diagnosed with severe asymptomatic right internal carotid artery stenosis. Right carotid endarterectomy has been recommended. Dr. Lianne Moris note and recommendation have been reviewed. The patient appears stable from a cardiac perspective and I think he can  proceed with carotid surgery at low risk of cardiac complications. Of note, he has no symptoms of angina at an acceptable workload of greater than 4 metabolic equivalents, no  uncontrolled arrhythmia, and no symptoms of heart failure.  3. Essential hypertension. BP well-controlled.   4. Paroxysmal atrial fibrillation. Patient continues on warfarin without bleeding complications. No symptoms of arrhythmia. Frequent PAC's on today's EKG. He does not require anticoagulation bridging at the time of surgery.  5. AAA: small AAA 2.8x3.2 cm. Last duplex 2015 reviewed today. Further surveillance at [facility] office.   Current medicines are reviewed with the patient today.  The patient does not have concerns regarding medicines.  Labs/ tests ordered today include:   Orders Placed This Encounter  Procedures  . EKG 12-Lead    Disposition:   FU 6 months  Signed, [name], MD  11/18/2015 10:28 AM    [facility] [address], [place], [place]  [address] Phone: [phone]; Fax: [phone]

## 2015-11-19 ENCOUNTER — Other Ambulatory Visit: Payer: Self-pay | Admitting: Cardiovascular Disease

## 2015-11-20 ENCOUNTER — Other Ambulatory Visit: Payer: Self-pay

## 2015-11-21 ENCOUNTER — Telehealth: Payer: Self-pay | Admitting: Cardiovascular Disease

## 2015-11-21 NOTE — Telephone Encounter (Signed)
New message    Request for surgical clearance:  1. What type of surgery is being performed?  Right Caroid   2. When is this surgery scheduled? 1.10.2017   3. Are there any medications that need to be held prior to surgery and how long? Coumadin  5 prior   4. Name of physician performing surgery? Dr. Bridgett Larsson   5. What is your office phone and fax number? office  [phone] / fax [phone]

## 2015-11-21 NOTE — Telephone Encounter (Signed)
To Coumadin Clinic. 

## 2015-11-21 NOTE — Telephone Encounter (Signed)
Called patient to let him know he has been cleared to hold his coumadin without lovenox bridge for 5 days prior to the procedure per protocol.  He was instructed to boost for one day (take 1.5 tablets) and then resume usual home dose per the instructions of surgeon. Will check an INR on 12/18/15 (8 days after procedure).

## 2015-12-05 ENCOUNTER — Ambulatory Visit: Payer: Medicare Other | Admitting: Cardiovascular Disease

## 2015-12-06 ENCOUNTER — Encounter (HOSPITAL_COMMUNITY)
Admission: RE | Admit: 2015-12-06 | Discharge: 2015-12-06 | Disposition: A | Discharge status: Home / Self Care | Payer: Medicare Other | Source: Ambulatory Visit | Attending: Vascular Surgery | Admitting: Vascular Surgery

## 2015-12-06 ENCOUNTER — Encounter (HOSPITAL_COMMUNITY): Payer: Self-pay

## 2015-12-06 DIAGNOSIS — E785 Hyperlipidemia, unspecified: Secondary | ICD-10-CM | POA: Diagnosis not present

## 2015-12-06 DIAGNOSIS — Z01812 Encounter for preprocedural laboratory examination: Secondary | ICD-10-CM | POA: Insufficient documentation

## 2015-12-06 DIAGNOSIS — E119 Type 2 diabetes mellitus without complications: Secondary | ICD-10-CM | POA: Insufficient documentation

## 2015-12-06 DIAGNOSIS — Z7984 Long term (current) use of oral hypoglycemic drugs: Secondary | ICD-10-CM | POA: Diagnosis not present

## 2015-12-06 DIAGNOSIS — I480 Paroxysmal atrial fibrillation: Secondary | ICD-10-CM | POA: Insufficient documentation

## 2015-12-06 DIAGNOSIS — I6529 Occlusion and stenosis of unspecified carotid artery: Secondary | ICD-10-CM | POA: Insufficient documentation

## 2015-12-06 DIAGNOSIS — Z79899 Other long term (current) drug therapy: Secondary | ICD-10-CM | POA: Insufficient documentation

## 2015-12-06 DIAGNOSIS — Z96653 Presence of artificial knee joint, bilateral: Secondary | ICD-10-CM | POA: Diagnosis not present

## 2015-12-06 DIAGNOSIS — Z7982 Long term (current) use of aspirin: Secondary | ICD-10-CM | POA: Insufficient documentation

## 2015-12-06 DIAGNOSIS — I252 Old myocardial infarction: Secondary | ICD-10-CM | POA: Insufficient documentation

## 2015-12-06 DIAGNOSIS — Z951 Presence of aortocoronary bypass graft: Secondary | ICD-10-CM | POA: Insufficient documentation

## 2015-12-06 DIAGNOSIS — I10 Essential (primary) hypertension: Secondary | ICD-10-CM | POA: Diagnosis not present

## 2015-12-06 DIAGNOSIS — Z0183 Encounter for blood typing: Secondary | ICD-10-CM | POA: Diagnosis not present

## 2015-12-06 DIAGNOSIS — I714 Abdominal aortic aneurysm, without rupture: Secondary | ICD-10-CM | POA: Insufficient documentation

## 2015-12-06 DIAGNOSIS — K219 Gastro-esophageal reflux disease without esophagitis: Secondary | ICD-10-CM | POA: Diagnosis not present

## 2015-12-06 DIAGNOSIS — Z01818 Encounter for other preprocedural examination: Secondary | ICD-10-CM | POA: Insufficient documentation

## 2015-12-06 DIAGNOSIS — Z7901 Long term (current) use of anticoagulants: Secondary | ICD-10-CM | POA: Insufficient documentation

## 2015-12-06 HISTORY — DX: Pneumonia, unspecified organism: J18.9

## 2015-12-06 HISTORY — DX: Unspecified osteoarthritis, unspecified site: M19.90

## 2015-12-06 LAB — URINE MICROSCOPIC-ADD ON

## 2015-12-06 LAB — CBC
HCT: 37 % — ABNORMAL LOW (ref 39.0–52.0)
Hemoglobin: 11.8 g/dL — ABNORMAL LOW (ref 13.0–17.0)
MCH: 30 pg (ref 26.0–34.0)
MCHC: 31.9 g/dL (ref 30.0–36.0)
MCV: 94.1 fL (ref 78.0–100.0)
PLATELETS: 138 10*3/uL — AB (ref 150–400)
RBC: 3.93 MIL/uL — ABNORMAL LOW (ref 4.22–5.81)
RDW: 13.5 % (ref 11.5–15.5)
WBC: 5.4 10*3/uL (ref 4.0–10.5)

## 2015-12-06 LAB — TYPE AND SCREEN
ABO/RH(D): A POS
ANTIBODY SCREEN: NEGATIVE

## 2015-12-06 LAB — URINALYSIS, ROUTINE W REFLEX MICROSCOPIC
BILIRUBIN URINE: NEGATIVE
Glucose, UA: NEGATIVE mg/dL
Hgb urine dipstick: NEGATIVE
KETONES UR: 15 mg/dL — AB
Leukocytes, UA: NEGATIVE
NITRITE: NEGATIVE
PH: 5.5 (ref 5.0–8.0)
Protein, ur: 30 mg/dL — AB
Specific Gravity, Urine: 1.028 (ref 1.005–1.030)

## 2015-12-06 LAB — COMPREHENSIVE METABOLIC PANEL
ALBUMIN: 3.8 g/dL (ref 3.5–5.0)
ALT: 24 U/L (ref 17–63)
AST: 26 U/L (ref 15–41)
Alkaline Phosphatase: 76 U/L (ref 38–126)
Anion gap: 6 (ref 5–15)
BUN: 21 mg/dL — ABNORMAL HIGH (ref 6–20)
CHLORIDE: 110 mmol/L (ref 101–111)
CO2: 26 mmol/L (ref 22–32)
Calcium: 8.7 mg/dL — ABNORMAL LOW (ref 8.9–10.3)
Creatinine, Ser: 1.39 mg/dL — ABNORMAL HIGH (ref 0.61–1.24)
GFR calc Af Amer: 55 mL/min — ABNORMAL LOW (ref >60)
GFR, EST NON AFRICAN AMERICAN: 48 mL/min — AB (ref >60)
GLUCOSE: 124 mg/dL — AB (ref 65–99)
POTASSIUM: 4 mmol/L (ref 3.5–5.1)
SODIUM: 142 mmol/L (ref 135–145)
Total Bilirubin: 0.6 mg/dL (ref 0.3–1.2)
Total Protein: 6.5 g/dL (ref 6.5–8.1)

## 2015-12-06 LAB — GLUCOSE, CAPILLARY: GLUCOSE-CAPILLARY: 121 mg/dL — AB (ref 65–99)

## 2015-12-06 LAB — ABO/RH: ABO/RH(D): A POS

## 2015-12-06 LAB — SURGICAL PCR SCREEN
MRSA, PCR: NEGATIVE
STAPHYLOCOCCUS AUREUS: NEGATIVE

## 2015-12-06 LAB — PROTIME-INR
INR: 2.99 — ABNORMAL HIGH (ref 0.00–1.49)
PROTHROMBIN TIME: 30.5 s — AB (ref 11.6–15.2)

## 2015-12-06 LAB — APTT: APTT: 41 s — AB (ref 24–37)

## 2015-12-06 NOTE — Pre-Procedure Instructions (Addendum)
Thomas Marshall  12/06/2015      [facility] - [place], [place] - [address] [place] [address] Phone: [phone] Fax: [phone]    Your procedure is scheduled on Jan 10  Report to [facility] at 530 A.M.  Call this number if you have problems the morning of surgery:  [phone]   Remember:  Do not eat food or drink liquids after midnight.  Take these medicines the morning of surgery with A SIP OF WATER clonidine (catapres), Omeprazole (Prilosec)  Stop Coumadin as directed by your Dr. 12-04-15  Stop taking aspirin, Ibuprofen, BC's, Goody's, Herbal medications, Fish Oil, Aleve How to Manage Your Diabetes Before Surgery   Why is it important to control my blood sugar before and after surgery?   Improving blood sugar levels before and after surgery helps healing and can limit problems.  A way of improving blood sugar control is eating a healthy diet by:  - Eating less sugar and carbohydrates  - Increasing activity/exercise  - Talk with your doctor about reaching your blood sugar goals  High blood sugars (greater than 180 mg/dL) can raise your risk of infections and slow down your recovery so you will need to focus on controlling your diabetes during the weeks before surgery.  Make sure that the doctor who takes care of your diabetes knows about your planned surgery including the date and location.  How do I manage my blood sugars before surgery?   Check your blood sugar at least 4 times a day, 2 days before surgery to make sure that they are not too high or low.   Check your blood sugar the morning of your surgery when you wake up and every 2               hours until you get to the Short-Stay unit.  If your blood sugar is less than 70 mg/dL, you will need to treat for low blood sugar by:  Treat a low blood sugar (less than 70 mg/dL) with 1/2 cup of clear juice (cranberry or apple), 4 glucose tablets, OR  glucose gel.  Recheck blood sugar in 15 minutes after treatment (to make sure it is greater than 70 mg/dL).  If blood sugar is not greater than 70 mg/dL on re-check, call [phone] for further instructions.   Report your blood sugar to the Short-Stay nurse when you get to Short-Stay.  References:  [facility] [place] [facility], 2007 "How to Manage your Diabetes Before and After Surgery".  What do I do about my diabetes medications?   Do not take oral diabetes medicines (pills) the morning of surgery.Metformin (Glucophage)    Do not take other diabetes injectables the day of surgery including Byetta, Victoza, Bydureon, and Trulicity.    If your CBG is greater than 220 mg/dL, you may take 1/2 of your sliding scale (correction) dose of insulin.    Do not wear jewelry, make-up or nail polish.  Do not wear lotions, powders, or perfumes.  You may wear deodorant.  Do not shave 48 hours prior to surgery.  Men may shave face and neck.  Do not bring valuables to the hospital.  [facility] is not responsible for any belongings or valuables.  Contacts, dentures or bridgework may not be worn into surgery.  Leave your suitcase in the car.  After surgery it may be brought to your room.  For patients admitted  to the hospital, discharge time will be determined by your treatment team.  Patients discharged the day of surgery will not be allowed to drive home.  Special instructions:  [facility] - Preparing for Surgery  Before surgery, you can play an important role.  Because skin is not sterile, your skin needs to be as free of germs as possible.  You can reduce the number of germs on you skin by washing with CHG (chlorahexidine gluconate) soap before surgery.  CHG is an antiseptic cleaner which kills germs and bonds with the skin to continue killing germs even after washing.  Please DO NOT use if you have an allergy to CHG or antibacterial soaps.  If your skin becomes  reddened/irritated stop using the CHG and inform your nurse when you arrive at Short Stay.  Do not shave (including legs and underarms) for at least 48 hours prior to the first CHG shower.  You may shave your face.  Please follow these instructions carefully:   1.  Shower with CHG Soap the night before surgery and the    morning of Surgery.  2.  If you choose to wash your hair, wash your hair first as usual with your  normal shampoo.  3.  After you shampoo, rinse your hair and body thoroughly to remove the   Shampoo.  4.  Use CHG as you would any other liquid soap.  You can apply chg directly  to the skin and wash gently with scrungie or a clean washcloth.  5.  Apply the CHG Soap to your body ONLY FROM THE NECK DOWN.        Do not use on open wounds or open sores.  Avoid contact with your eyes,       ears, mouth and genitals (private parts).  Wash genitals (private parts)    with your normal soap.  6.  Wash thoroughly, paying special attention to the area where your surgery   will be performed.  7.  Thoroughly rinse your body with warm water from the neck down.  8.  DO NOT shower/wash with your normal soap after using and rinsing off  the CHG Soap.  9.  Pat yourself dry with a clean towel.            10.  Wear clean pajamas.            11.  Place clean sheets on your bed the night of your first shower and do not  sleep with pets.  Day of Surgery  Do not apply any lotions/deoderants the morning of surgery.  Please wear clean clothes to the hospital/surgery center.     Please read over the following fact sheets that you were given. Pain Booklet, Coughing and Deep Breathing, Blood Transfusion Information, Total Joint Packet and MRSA Information

## 2015-12-06 NOTE — Progress Notes (Signed)
   12/06/15 0903  OBSTRUCTIVE SLEEP APNEA  Have you ever been diagnosed with sleep apnea through a sleep study? No  Do you snore loudly (loud enough to be heard through closed doors)?  0  Do you often feel tired, fatigued, or sleepy during the daytime (such as falling asleep during driving or talking to someone)? 0  Has anyone observed you stop breathing during your sleep? 1  Do you have, or are you being treated for high blood pressure? 1  BMI more than 35 kg/m2? 0  Age > 50 (1-yes) 1  Neck circumference greater than:Male 16 inches or larger, Male 17inches or larger? 1  Male Gender (Yes=1) 1  Obstructive Sleep Apnea Score 5

## 2015-12-06 NOTE — Progress Notes (Addendum)
PCP is Dr Lavon Paganini Cardiologist is Dr. Burt Knack Reports he does not check his CBG's, but does have a machine at home. Pt reports he stopped taking coumadin 12-04-15

## 2015-12-07 LAB — HEMOGLOBIN A1C
Hgb A1c MFr Bld: 7.4 % — ABNORMAL HIGH (ref 4.8–5.6)
Mean Plasma Glucose: 166 mg/dL

## 2015-12-09 MED ORDER — CHLORHEXIDINE GLUCONATE CLOTH 2 % EX PADS: 6.0000 | MEDICATED_PAD | Freq: Once | CUTANEOUS | Status: DC

## 2015-12-09 MED ORDER — SODIUM CHLORIDE 0.9 % IV SOLN
INTRAVENOUS | Status: DC
Start: 2015-12-10 — End: 2015-12-10

## 2015-12-09 MED ORDER — VANCOMYCIN HCL 10 G IV SOLR
1500.0000 mg | INTRAVENOUS | Status: AC
  Administered 2015-12-10: 1500 mg via INTRAVENOUS
  Filled 2015-12-09: qty 1500

## 2015-12-09 NOTE — Progress Notes (Signed)
Anesthesia chart review: Patient is a 75 year old male scheduled for right carotid endarterectomy on 12/10/2015 by Dr. Bridgett Larsson.  History includes CAD s/p CABG '04 ([place]), HTN, HLD, PAF, 3.2 cm AAA (05/09/14 U/S; 2.7 cm 05/13/15), carotid artery stenosis, GERD, DM2, skin cancer ([facility]CC), bilateral TKA, cholecystectomy, tonsillectomy. OSA screening score was 5.  PCP is Dr Lavon Paganini Cardiologist is Dr. Burt Knack. Following 11/18/15, he felt patient was at low risk for cardiac complications. He was given permission to hold warfarin without a Lovenox bridge. Last dose 12/04/15.   Meds include aspirin 81 mg, Lipitor, vitamin D, clonidine, metformin, nitroglycerin, Prilosec, Aldactone, warfarin.  11/18/15 EKG: SR with PACs.  08/16/13 Nuclear stress test ([facility]): Conclusions: Normal myocardial perfusion study without evidence of inducible ischemia. There is an area of decreased activity in the inferior left ventricle at rest and post stress with normal segmental contractility, highly suggestive of diaphragmatic attenuation. Normal left ventricular wall motion and segmental function. The post stress EF is measured at 64%. Exercise tolerance not assessed as this was a pharmacological stress.  11/15/15 Carotid duplex: Impression: Doppler velocities suggest 80-99% stenosis of the right proximal ICA and a 40-59% stenosis of the left proximal ICA. Greater than 50% stenosis of the left external carotid artery noted. No significant stenosis of the right external or bilateral common carotid arteries.  Preoperative labs noted. Creatinine 1.39. H&H 11.8/37.0. INR 2.99. PTT 41. Glucose 124. A1c 7.4. Reports he does not check his CBG's, but does have a machine at home. Plan to repeat PT/PTT on arrival. If follow-up labs are acceptable and otherwise no acute changes then I would anticipate that he can proceed as planned.  [facility] Short Stay Center/Anesthesiology Phone [phone] 12/09/2015 9:54 AM

## 2015-12-10 ENCOUNTER — Encounter (HOSPITAL_COMMUNITY)
Admission: RE | Disposition: A | Discharge status: Home / Self Care | Payer: Medicare Other | Source: Ambulatory Visit | Attending: Vascular Surgery

## 2015-12-10 ENCOUNTER — Inpatient Hospital Stay (HOSPITAL_COMMUNITY)
Admission: RE | Admit: 2015-12-10 | Discharge: 2015-12-11 | DRG: 039 | Disposition: A | Discharge status: Home / Self Care | Payer: Medicare Other | Source: Ambulatory Visit | Attending: Vascular Surgery | Admitting: Vascular Surgery

## 2015-12-10 ENCOUNTER — Encounter (HOSPITAL_COMMUNITY): Payer: Self-pay | Admitting: Surgery

## 2015-12-10 ENCOUNTER — Inpatient Hospital Stay (HOSPITAL_COMMUNITY): Payer: Medicare Other | Admitting: Anesthesiology

## 2015-12-10 ENCOUNTER — Inpatient Hospital Stay (HOSPITAL_COMMUNITY): Payer: Medicare Other | Admitting: Vascular Surgery

## 2015-12-10 DIAGNOSIS — I480 Paroxysmal atrial fibrillation: Secondary | ICD-10-CM | POA: Diagnosis present

## 2015-12-10 DIAGNOSIS — R001 Bradycardia, unspecified: Secondary | ICD-10-CM | POA: Diagnosis present

## 2015-12-10 DIAGNOSIS — I6521 Occlusion and stenosis of right carotid artery: Principal | ICD-10-CM | POA: Diagnosis present

## 2015-12-10 DIAGNOSIS — Z7982 Long term (current) use of aspirin: Secondary | ICD-10-CM

## 2015-12-10 DIAGNOSIS — Z96653 Presence of artificial knee joint, bilateral: Secondary | ICD-10-CM | POA: Diagnosis present

## 2015-12-10 DIAGNOSIS — I6529 Occlusion and stenosis of unspecified carotid artery: Secondary | ICD-10-CM | POA: Diagnosis present

## 2015-12-10 DIAGNOSIS — Z7901 Long term (current) use of anticoagulants: Secondary | ICD-10-CM | POA: Diagnosis not present

## 2015-12-10 DIAGNOSIS — Z951 Presence of aortocoronary bypass graft: Secondary | ICD-10-CM

## 2015-12-10 DIAGNOSIS — E119 Type 2 diabetes mellitus without complications: Secondary | ICD-10-CM | POA: Diagnosis not present

## 2015-12-10 DIAGNOSIS — I2510 Atherosclerotic heart disease of native coronary artery without angina pectoris: Secondary | ICD-10-CM | POA: Diagnosis present

## 2015-12-10 DIAGNOSIS — I10 Essential (primary) hypertension: Secondary | ICD-10-CM | POA: Diagnosis not present

## 2015-12-10 DIAGNOSIS — Z87891 Personal history of nicotine dependence: Secondary | ICD-10-CM

## 2015-12-10 DIAGNOSIS — E785 Hyperlipidemia, unspecified: Secondary | ICD-10-CM | POA: Diagnosis present

## 2015-12-10 DIAGNOSIS — Z85828 Personal history of other malignant neoplasm of skin: Secondary | ICD-10-CM | POA: Diagnosis not present

## 2015-12-10 DIAGNOSIS — K219 Gastro-esophageal reflux disease without esophagitis: Secondary | ICD-10-CM | POA: Diagnosis present

## 2015-12-10 HISTORY — PX: ENDARTERECTOMY: SHX5162

## 2015-12-10 LAB — GLUCOSE, CAPILLARY
GLUCOSE-CAPILLARY: 115 mg/dL — AB (ref 65–99)
Glucose-Capillary: 95 mg/dL (ref 65–99)
Glucose-Capillary: 126 mg/dL — ABNORMAL HIGH (ref 65–99)

## 2015-12-10 LAB — PROTIME-INR
INR: 1.17 (ref 0.00–1.49)
PROTHROMBIN TIME: 15.1 s (ref 11.6–15.2)

## 2015-12-10 LAB — APTT: aPTT: 34 seconds (ref 24–37)

## 2015-12-10 SURGERY — ENDARTERECTOMY, CAROTID
Anesthesia: General | Site: Neck | Laterality: Right

## 2015-12-10 MED ORDER — MORPHINE SULFATE (PF) 2 MG/ML IV SOLN: 2.0000 mg | INTRAVENOUS | Status: DC | PRN

## 2015-12-10 MED ORDER — SUCCINYLCHOLINE CHLORIDE 20 MG/ML IJ SOLN
INTRAMUSCULAR | Status: DC | PRN
  Administered 2015-12-10: 100 mg via INTRAVENOUS

## 2015-12-10 MED ORDER — 0.9 % SODIUM CHLORIDE (POUR BTL) OPTIME
TOPICAL | Status: DC | PRN
  Administered 2015-12-10: 2000 mL

## 2015-12-10 MED ORDER — NITROGLYCERIN 0.4 MG SL SUBL: 0.4000 mg | SUBLINGUAL_TABLET | SUBLINGUAL | Status: DC | PRN

## 2015-12-10 MED ORDER — PROTAMINE SULFATE 10 MG/ML IV SOLN
INTRAVENOUS | Status: DC | PRN
  Administered 2015-12-10 (×2): 10 mg via INTRAVENOUS
  Administered 2015-12-10: 5 mg via INTRAVENOUS
  Administered 2015-12-10 (×2): 10 mg via INTRAVENOUS
  Administered 2015-12-10: 5 mg via INTRAVENOUS

## 2015-12-10 MED ORDER — LIDOCAINE HCL 4 % EX SOLN
CUTANEOUS | Status: DC | PRN
  Administered 2015-12-10: 80 mL via TOPICAL

## 2015-12-10 MED ORDER — PHENOL 1.4 % MT LIQD: 1.0000 | OROMUCOSAL | Status: DC | PRN

## 2015-12-10 MED ORDER — PROPOFOL 10 MG/ML IV BOLUS
INTRAVENOUS | Status: DC | PRN
  Administered 2015-12-10: 100 mg via INTRAVENOUS

## 2015-12-10 MED ORDER — DEXTRAN 40 IN SALINE 10-0.9 % IV SOLN
INTRAVENOUS | Status: DC | PRN
  Administered 2015-12-10: 500 mL

## 2015-12-10 MED ORDER — HYDRALAZINE HCL 20 MG/ML IJ SOLN: 5.0000 mg | INTRAMUSCULAR | Status: DC | PRN

## 2015-12-10 MED ORDER — DOCUSATE SODIUM 100 MG PO CAPS
100.0000 mg | ORAL_CAPSULE | Freq: Every day | ORAL | Status: DC
  Administered 2015-12-11: 100 mg via ORAL
  Filled 2015-12-10 (×2): qty 1

## 2015-12-10 MED ORDER — DEXTRAN 40 IN SALINE 10-0.9 % IV SOLN
INTRAVENOUS | Status: AC
Start: 2015-12-10 — End: 2015-12-10
  Filled 2015-12-10: qty 500

## 2015-12-10 MED ORDER — ONDANSETRON HCL 4 MG/2ML IJ SOLN: 4.0000 mg | Freq: Four times a day (QID) | INTRAMUSCULAR | Status: DC | PRN

## 2015-12-10 MED ORDER — PANTOPRAZOLE SODIUM 40 MG PO TBEC
40.0000 mg | DELAYED_RELEASE_TABLET | Freq: Every day | ORAL | Status: DC
  Administered 2015-12-10 – 2015-12-11 (×2): 40 mg via ORAL
  Filled 2015-12-10 (×2): qty 1

## 2015-12-10 MED ORDER — LACTATED RINGERS IV SOLN
INTRAVENOUS | Status: DC | PRN
  Administered 2015-12-10 (×2): via INTRAVENOUS

## 2015-12-10 MED ORDER — SODIUM CHLORIDE 0.9 % IV SOLN: INTRAVENOUS | Status: DC

## 2015-12-10 MED ORDER — MAGNESIUM SULFATE 2 GM/50ML IV SOLN: 2.0000 g | Freq: Every day | INTRAVENOUS | Status: DC | PRN

## 2015-12-10 MED ORDER — ACETAMINOPHEN 325 MG PO TABS: 325.0000 mg | ORAL_TABLET | ORAL | Status: DC | PRN

## 2015-12-10 MED ORDER — LIDOCAINE HCL (PF) 1 % IJ SOLN
INTRAMUSCULAR | Status: DC | PRN
  Administered 2015-12-10: 1 mL

## 2015-12-10 MED ORDER — PROPOFOL 10 MG/ML IV BOLUS
INTRAVENOUS | Status: AC
Start: 2015-12-10 — End: 2015-12-10
  Filled 2015-12-10: qty 20

## 2015-12-10 MED ORDER — THROMBIN 20000 UNITS EX SOLR
CUTANEOUS | Status: AC
  Filled 2015-12-10: qty 20000

## 2015-12-10 MED ORDER — LIDOCAINE HCL (PF) 1 % IJ SOLN
INTRAMUSCULAR | Status: AC
  Filled 2015-12-10: qty 30

## 2015-12-10 MED ORDER — FENTANYL CITRATE (PF) 250 MCG/5ML IJ SOLN
INTRAMUSCULAR | Status: AC
  Filled 2015-12-10: qty 5

## 2015-12-10 MED ORDER — SENNOSIDES-DOCUSATE SODIUM 8.6-50 MG PO TABS: 1.0000 | ORAL_TABLET | Freq: Every evening | ORAL | Status: DC | PRN

## 2015-12-10 MED ORDER — ROCURONIUM BROMIDE 50 MG/5ML IV SOLN
INTRAVENOUS | Status: AC
Start: 2015-12-10 — End: 2015-12-10
  Filled 2015-12-10: qty 1

## 2015-12-10 MED ORDER — VITAMIN C 500 MG PO TABS
500.0000 mg | ORAL_TABLET | Freq: Every day | ORAL | Status: DC
  Administered 2015-12-10 – 2015-12-11 (×2): 500 mg via ORAL
  Filled 2015-12-10 (×2): qty 1

## 2015-12-10 MED ORDER — GUAIFENESIN-DM 100-10 MG/5ML PO SYRP: 15.0000 mL | ORAL_SOLUTION | ORAL | Status: DC | PRN

## 2015-12-10 MED ORDER — ONDANSETRON HCL 4 MG/2ML IJ SOLN: 4.0000 mg | Freq: Once | INTRAMUSCULAR | Status: DC | PRN

## 2015-12-10 MED ORDER — FENTANYL CITRATE (PF) 100 MCG/2ML IJ SOLN
INTRAMUSCULAR | Status: DC | PRN
  Administered 2015-12-10 (×2): 100 ug via INTRAVENOUS
  Administered 2015-12-10: 50 ug via INTRAVENOUS

## 2015-12-10 MED ORDER — BISACODYL 5 MG PO TBEC: 5.0000 mg | DELAYED_RELEASE_TABLET | Freq: Every day | ORAL | Status: DC | PRN

## 2015-12-10 MED ORDER — ESMOLOL HCL 100 MG/10ML IV SOLN
INTRAVENOUS | Status: DC | PRN
  Administered 2015-12-10: 20 mg via INTRAVENOUS

## 2015-12-10 MED ORDER — SUCCINYLCHOLINE CHLORIDE 20 MG/ML IJ SOLN
INTRAMUSCULAR | Status: AC
  Filled 2015-12-10: qty 1

## 2015-12-10 MED ORDER — CLONIDINE HCL 0.1 MG PO TABS
0.1000 mg | ORAL_TABLET | Freq: Two times a day (BID) | ORAL | Status: DC
  Administered 2015-12-11 (×2): 0.1 mg via ORAL
  Filled 2015-12-10 (×2): qty 1

## 2015-12-10 MED ORDER — FENTANYL CITRATE (PF) 100 MCG/2ML IJ SOLN
25.0000 ug | INTRAMUSCULAR | Status: DC | PRN
Start: 2015-12-10 — End: 2015-12-10

## 2015-12-10 MED ORDER — LIDOCAINE HCL (CARDIAC) 20 MG/ML IV SOLN
INTRAVENOUS | Status: DC | PRN
  Administered 2015-12-10: 60 mg via INTRAVENOUS

## 2015-12-10 MED ORDER — ONDANSETRON HCL 4 MG/2ML IJ SOLN
INTRAMUSCULAR | Status: DC | PRN
  Administered 2015-12-10: 4 mg via INTRAVENOUS

## 2015-12-10 MED ORDER — NEOSTIGMINE METHYLSULFATE 10 MG/10ML IV SOLN
INTRAVENOUS | Status: DC | PRN
  Administered 2015-12-10: 2 mg via INTRAVENOUS

## 2015-12-10 MED ORDER — ASPIRIN EC 81 MG PO TBEC
81.0000 mg | DELAYED_RELEASE_TABLET | Freq: Every day | ORAL | Status: DC
  Administered 2015-12-10 – 2015-12-11 (×2): 81 mg via ORAL
  Filled 2015-12-10 (×2): qty 1

## 2015-12-10 MED ORDER — POTASSIUM CHLORIDE CRYS ER 20 MEQ PO TBCR: 20.0000 meq | EXTENDED_RELEASE_TABLET | Freq: Every day | ORAL | Status: DC | PRN

## 2015-12-10 MED ORDER — VITAMIN D3 25 MCG (1000 UNIT) PO TABS
1000.0000 [IU] | ORAL_TABLET | Freq: Every day | ORAL | Status: DC
  Administered 2015-12-10 – 2015-12-11 (×2): 1000 [IU] via ORAL
  Filled 2015-12-10 (×4): qty 1

## 2015-12-10 MED ORDER — GLYCOPYRROLATE 0.2 MG/ML IJ SOLN
INTRAMUSCULAR | Status: DC | PRN
  Administered 2015-12-10: 0.3 mg via INTRAVENOUS
  Administered 2015-12-10: .2 mg via INTRAVENOUS

## 2015-12-10 MED ORDER — METFORMIN HCL 500 MG PO TABS: 1000.0000 mg | ORAL_TABLET | Freq: Every day | ORAL | Status: DC

## 2015-12-10 MED ORDER — ROCURONIUM BROMIDE 100 MG/10ML IV SOLN
INTRAVENOUS | Status: DC | PRN
  Administered 2015-12-10: 20 mg via INTRAVENOUS
  Administered 2015-12-10 (×3): 10 mg via INTRAVENOUS

## 2015-12-10 MED ORDER — INSULIN ASPART 100 UNIT/ML ~~LOC~~ SOLN
0.0000 [IU] | Freq: Three times a day (TID) | SUBCUTANEOUS | Status: DC
  Administered 2015-12-11: 1 [IU] via SUBCUTANEOUS

## 2015-12-10 MED ORDER — WARFARIN SODIUM 5 MG PO TABS
10.0000 mg | ORAL_TABLET | Freq: Every day | ORAL | Status: DC
  Administered 2015-12-10: 10 mg via ORAL
  Filled 2015-12-10: qty 2

## 2015-12-10 MED ORDER — THROMBIN 20000 UNITS EX KIT
PACK | CUTANEOUS | Status: DC | PRN
  Administered 2015-12-10: 10:00:00 via TOPICAL

## 2015-12-10 MED ORDER — ACETAMINOPHEN 650 MG RE SUPP: 325.0000 mg | RECTAL | Status: DC | PRN

## 2015-12-10 MED ORDER — PHENYLEPHRINE HCL 10 MG/ML IJ SOLN
10.0000 mg | INTRAVENOUS | Status: DC | PRN
  Administered 2015-12-10: 10 ug/min via INTRAVENOUS

## 2015-12-10 MED ORDER — ATORVASTATIN CALCIUM 40 MG PO TABS
40.0000 mg | ORAL_TABLET | Freq: Every day | ORAL | Status: DC
Start: 2015-12-10 — End: 2015-12-11
  Administered 2015-12-10 – 2015-12-11 (×2): 40 mg via ORAL
  Filled 2015-12-10 (×2): qty 1

## 2015-12-10 MED ORDER — VANCOMYCIN HCL IN DEXTROSE 1-5 GM/200ML-% IV SOLN
1000.0000 mg | Freq: Two times a day (BID) | INTRAVENOUS | Status: AC
  Administered 2015-12-10 – 2015-12-11 (×2): 1000 mg via INTRAVENOUS
  Filled 2015-12-10 (×3): qty 200

## 2015-12-10 MED ORDER — SODIUM CHLORIDE 0.9 % IV SOLN
INTRAVENOUS | Status: DC | PRN
  Administered 2015-12-10: 08:00:00

## 2015-12-10 MED ORDER — HEPARIN SODIUM (PORCINE) 1000 UNIT/ML IJ SOLN
INTRAMUSCULAR | Status: DC | PRN
  Administered 2015-12-10: 10000 [IU] via INTRAVENOUS

## 2015-12-10 MED ORDER — SODIUM CHLORIDE 0.9 % IV SOLN: 500.0000 mL | Freq: Once | INTRAVENOUS | Status: DC | PRN

## 2015-12-10 MED ORDER — ONDANSETRON HCL 4 MG/2ML IJ SOLN
INTRAMUSCULAR | Status: AC
Start: 2015-12-10 — End: 2015-12-10
  Filled 2015-12-10: qty 2

## 2015-12-10 MED ORDER — LIDOCAINE HCL (CARDIAC) 20 MG/ML IV SOLN
INTRAVENOUS | Status: AC
  Filled 2015-12-10: qty 5

## 2015-12-10 MED ORDER — SPIRONOLACTONE 25 MG PO TABS
25.0000 mg | ORAL_TABLET | Freq: Every day | ORAL | Status: DC
  Administered 2015-12-10 – 2015-12-11 (×2): 25 mg via ORAL
  Filled 2015-12-10 (×2): qty 1

## 2015-12-10 MED ORDER — LIDOCAINE HCL (CARDIAC) 20 MG/ML IV SOLN
INTRAVENOUS | Status: AC
Start: 2015-12-10 — End: 2015-12-10
  Filled 2015-12-10: qty 5

## 2015-12-10 MED ORDER — ALUM & MAG HYDROXIDE-SIMETH 200-200-20 MG/5ML PO SUSP: 15.0000 mL | ORAL | Status: DC | PRN

## 2015-12-10 MED ORDER — WARFARIN - PHYSICIAN DOSING INPATIENT: Freq: Every day | Status: DC

## 2015-12-10 MED ORDER — OXYCODONE-ACETAMINOPHEN 5-325 MG PO TABS: 1.0000 | ORAL_TABLET | ORAL | Status: DC | PRN

## 2015-12-10 MED ORDER — VITAMIN E 180 MG (400 UNIT) PO CAPS
400.0000 [IU] | ORAL_CAPSULE | Freq: Every day | ORAL | Status: DC
  Administered 2015-12-10 – 2015-12-11 (×2): 400 [IU] via ORAL
  Filled 2015-12-10 (×2): qty 1

## 2015-12-10 SURGICAL SUPPLY — 49 items
ADPR TBG 2 MALE LL ART (MISCELLANEOUS) ×1
AGENT HMST SPONGE THK3/8 (HEMOSTASIS)
BAG DECANTER FOR FLEXI CONT (MISCELLANEOUS) ×2 IMPLANT
CANISTER SUCTION 2500CC (MISCELLANEOUS) ×2 IMPLANT
CATH ROBINSON RED A/P 18FR (CATHETERS) ×2 IMPLANT
CATH SUCT 10FR WHISTLE TIP (CATHETERS) IMPLANT
CLIP TI MEDIUM 6 (CLIP) ×2 IMPLANT
CLIP TI WIDE RED SMALL 6 (CLIP) ×2 IMPLANT
COVER PROBE W GEL 5X96 (DRAPES) ×1 IMPLANT
CRADLE DONUT ADULT HEAD (MISCELLANEOUS) ×2 IMPLANT
ELECT REM PT RETURN 9FT ADLT (ELECTROSURGICAL) ×2
ELECTRODE REM PT RTRN 9FT ADLT (ELECTROSURGICAL) ×1 IMPLANT
GAUZE SPONGE 4X4 12PLY STRL (GAUZE/BANDAGES/DRESSINGS) ×4 IMPLANT
GLOVE BIO SURGEON STRL SZ7 (GLOVE) ×2 IMPLANT
GLOVE BIOGEL PI IND STRL 7.5 (GLOVE) ×1 IMPLANT
GLOVE BIOGEL PI INDICATOR 7.5 (GLOVE) ×1
GOWN STRL REUS W/ TWL LRG LVL3 (GOWN DISPOSABLE) ×3 IMPLANT
GOWN STRL REUS W/TWL LRG LVL3 (GOWN DISPOSABLE) ×6
HEMOSTAT SPONGE AVITENE ULTRA (HEMOSTASIS) IMPLANT
IV ADAPTER SYR DOUBLE MALE LL (MISCELLANEOUS) ×1 IMPLANT
KIT BASIN OR (CUSTOM PROCEDURE TRAY) ×2 IMPLANT
KIT ROOM TURNOVER OR (KITS) ×2 IMPLANT
LIQUID BAND (GAUZE/BANDAGES/DRESSINGS) ×2 IMPLANT
NDL HYPO 25GX1X1/2 BEV (NEEDLE) IMPLANT
NEEDLE HYPO 25GX1X1/2 BEV (NEEDLE) ×2 IMPLANT
NS IRRIG 1000ML POUR BTL (IV SOLUTION) ×6 IMPLANT
PACK CAROTID (CUSTOM PROCEDURE TRAY) ×2 IMPLANT
PAD ARMBOARD 7.5X6 YLW CONV (MISCELLANEOUS) ×4 IMPLANT
SET COLLECT BLD 21X3/4 12 PB (MISCELLANEOUS) ×1 IMPLANT
SHUNT CAROTID BYPASS 10 (VASCULAR PRODUCTS) IMPLANT
SHUNT CAROTID BYPASS 12FRX15.5 (VASCULAR PRODUCTS) IMPLANT
SPONGE INTESTINAL PEANUT (DISPOSABLE) ×2 IMPLANT
SPONGE SURGIFOAM ABS GEL 100 (HEMOSTASIS) ×1 IMPLANT
STOPCOCK 4 WAY LG BORE MALE ST (IV SETS) ×1 IMPLANT
SUT ETHILON 3 0 PS 1 (SUTURE) IMPLANT
SUT MNCRL AB 4-0 PS2 18 (SUTURE) ×2 IMPLANT
SUT PROLENE 6 0 BV (SUTURE) ×7 IMPLANT
SUT PROLENE 7 0 BV 1 (SUTURE) IMPLANT
SUT SILK 3 0 TIES 17X18 (SUTURE)
SUT SILK 3-0 18XBRD TIE BLK (SUTURE) IMPLANT
SUT VIC AB 3-0 SH 27 (SUTURE) ×2
SUT VIC AB 3-0 SH 27X BRD (SUTURE) ×1 IMPLANT
SYR CONTROL 10ML LL (SYRINGE) ×1 IMPLANT
SYR TB 1ML LUER SLIP (SYRINGE) IMPLANT
SYRINGE 20CC LL (MISCELLANEOUS) ×1 IMPLANT
SYSTEM CHEST DRAIN TLS 7FR (DRAIN) IMPLANT
TUBING ART PRESS 48 MALE/FEM (TUBING) ×1 IMPLANT
TUBING EXTENTION W/L.L. (IV SETS) IMPLANT
WATER STERILE IRR 1000ML POUR (IV SOLUTION) ×2 IMPLANT

## 2015-12-10 NOTE — H&P (View-Only) (Signed)
Established Carotid Patient  History of Present Illness  Thomas Marshall is a 75 y.o. (1941-08-28) male who presents with chief complaint: routine follow up.  Previous carotid studies demonstrated: RICA 60-79% (high) stenosis, LICA [id number] stenosis.  Patient has no history of TIA or stroke symptom.  The patient has never had amaurosis fugax or monocular blindness.  The patient has never had facial drooping or hemiplegia.  The patient has never had receptive or expressive aphasia.    Past Medical History  Diagnosis Date  . Cancer (HCC)     skin & squamous cell  . Diabetes mellitus without complication ([facility])   . GERD (gastroesophageal reflux disease)   . Hyperlipidemia   . Hypertension   . Coronary artery disease   . PAF (paroxysmal atrial fibrillation) [facility])     Past Surgical History  Procedure Laterality Date  . Exploration post operative open heart    . Urinary surgery      scar tissue  . Ankle surgery      right fused  . Knee surgery      replacement on both knees  . Back surgery      low back   . Finger surgery      skin graft on rt index  . Coronary artery bypass graft  2004    [place]  . Tonsillectomy    . Colonoscopy    . Hernia repair Right     [facility]  . Cholecystectomy N/A 12/07/2013    Procedure: LAPAROSCOPIC CHOLECYSTECTOMY ;  Surgeon: [name], MD;  Location: [facility];  Service: General;  Laterality: N/A;  . Joint replacement    . Prostate surgery      Social History   Social History  . Marital Status: Married    Spouse Name: N/A  . Number of Children: N/A  . Years of Education: N/A   Occupational History  . Not on file.   Social History Main Topics  . Smoking status: Former Smoker -- 1.00 packs/day for 13 years    Quit date: 11/30/1970  . Smokeless tobacco: Never Used  . Alcohol Use: No  . Drug Use: No  . Sexual Activity: Yes   Other Topics Concern  . Not on file   Social History Narrative    Family History  Problem Relation Age of  Onset  . Stroke Mother   . Heart disease Mother   . Heart disease Father   . Hypertension Father   . Hyperlipidemia Father   . Cancer Sister     breast  and skin  . Diabetes Sister   . Hypertension Sister   . Diabetes Brother   . Heart disease Brother   . Hyperlipidemia Brother   . Hypertension Brother   . Early death Neg Hx     Current Outpatient Prescriptions  Medication Sig Dispense Refill  . aspirin 81 MG tablet Take 81 mg by mouth daily.    [name] atorvastatin (LIPITOR) 40 MG tablet Take 40 mg by mouth daily.    . cholecalciferol (VITAMIN D) 1000 UNITS tablet Take 1,000 Units by mouth daily.    . cloNIDine (CATAPRES) 0.1 MG tablet Take 1 tablet (0.1 mg total) by mouth 2 (two) times daily. 180 tablet 3  . metFORMIN (GLUCOPHAGE) 1000 MG tablet Take 1 tablet (1,000 mg total) by mouth daily after supper. 30 tablet 1  . metFORMIN (GLUCOPHAGE) 500 MG tablet TAKE TWO TABLETS BY MOUTH TWICE DAILY WITH A MEAL 60 tablet 0  .  nitroGLYCERIN (NITROSTAT) 0.4 MG SL tablet Place 1 tablet (0.4 mg total) under the tongue every 5 (five) minutes as needed for chest pain. 25 tablet 3  . omeprazole (PRILOSEC) 20 MG capsule Take 20 mg by mouth daily.    [name] spironolactone (ALDACTONE) 25 MG tablet TAKE ONE TABLET BY MOUTH ONCE DAILY 90 tablet 1  . vitamin C (ASCORBIC ACID) 500 MG tablet Take 500 mg by mouth daily.    . vitamin E 400 UNIT capsule Take 400 Units by mouth daily.    [name] warfarin (COUMADIN) 10 MG tablet Take 1 tablet (10 mg total) by mouth as directed. 100 tablet 1  . doxycycline (VIBRAMYCIN) 100 MG capsule Take 1 capsule (100 mg total) by mouth 2 (two) times daily. (Patient not taking: Reported on 11/15/2015) 20 capsule 0  . mupirocin ointment (BACTROBAN) 2 % Place 1 application into the nose 2 (two) times daily. (Patient not taking: Reported on 11/15/2015) 22 g 0   No current facility-administered medications for this visit.     Allergies  Allergen Reactions  . Carvedilol     Low heart  rate   . Lisinopril Swelling    Angioedema.   . Losartan Other (See Comments)    Angioedema   . Nifedipine Swelling  . Triamterene Swelling  . Amoxicillin Rash  . Ampicillin Rash     REVIEW OF SYSTEMS:  (Positives checked otherwise negative)  CARDIOVASCULAR:   [ ]  chest pain,  [ ]  chest pressure,  [x]  prior history of CABG [x]  prior cardiac arrhythmia [ ]  shortness of breath when laying flat,  [ ]  shortness of breath with exertion,   [ ]  pain in feet when walking,  [ ]  pain in feet when laying flat, [ ]  history of blood clot in veins (DVT),  [ ]  history of phlebitis,  [ ]  swelling in legs,  [ ]  varicose veins  PULMONARY:   [ ]  productive cough,  [ ]  asthma,  [ ]  wheezing  NEUROLOGIC:   [ ]  weakness in arms or legs,  [ ]  numbness in arms or legs,  [ ]  difficulty speaking or slurred speech,  [ ]  temporary loss of vision in one eye,  [ ]  dizziness  HEMATOLOGIC:   [ ]  bleeding problems,  [ ]  problems with blood clotting too easily  MUSCULOSKEL:   [ ]  joint pain, [ ]  joint swelling  GASTROINTEST:   [ ]  vomiting blood,  [ ]  blood in stool     GENITOURINARY:   [ ]  burning with urination,  [ ]  blood in urine  PSYCHIATRIC:   [ ]  history of major depression  INTEGUMENTARY:   [ ]  rashes,  [ ]  ulcers  CONSTITUTIONAL:   [ ]  fever,  [ ]  chills    Physical Examination  Filed Vitals:   11/15/15 0950 11/15/15 0953 11/15/15 0955  BP: 159/78 144/73 147/73  Pulse: 97 90 93  Temp: 98 F (36.7 C)    Height: 6\' 2"  (1.88 m)    Weight: 228 lb (103.42 kg)    SpO2: 100%     Body mass index is 29.26 kg/(m^2).  General: A&O x 3, WDWN  Eyes: PERRLA, EOMI  Neck: Supple, no nuchal rigidity, no palpable LAD  Pulmonary: Sym exp, good air movt, CTAB, no rales, rhonchi, & wheezing  Cardiac: RRR, Nl S1, S2, no Murmurs, rubs or gallops  Vascular: Vessel Right Left  Radial Palpable Palpable  Brachial Palpable Palpable  Carotid Palpable, without bruit  Palpable, without bruit  Aorta Not palpable N/A  Femoral Palpable Palpable  Popliteal Not palpable Not palpable  PT Palpable Palpable  DP Palpable Palpable   Gastrointestinal: soft, NTND, no G/R, no HSM, no masses, no CVAT B  Musculoskeletal: M/S 5/5 throughout , Extremities without ischemic changes ,   Neurologic: CN 2-12 intact , Pain and light touch intact in extremities , Motor exam as listed above   Non-Invasive Vascular Imaging  CAROTID DUPLEX (Date: 11/15/2015 ):   R ICA stenosis: >80%  R VA: patent and antegrade  L ICA stenosis: 40-59%  L VA: patent and antegrade  ABI (Date: 11/15/2015)  R:   ABI: 1.17,   DP: tri,   PT: tri,   TBI: 0.70  L:   ABI: 1.07,   DP: tri,   PT: tri,   TBI: 0.75   Medical Decision Making  Thomas Marshall is a 75 y.o. male who presents with: asx R ICA stenosis >80%., asx L ICA <80%   Based on the patient's vascular studies and examination, I have offered the patient: R CEA. I discussed with the patient the risks, benefits, and alternatives to carotid endarterectomy.   The patient is a candidate for carotid artery stenting. If his cardiac risk stratification is not high; however, would not offer CAS as there is nearly a 3-4 times higher stroke rate. I discussed the procedural details of carotid endarterectomy with the patient.   The patient is aware that the risks of carotid endarterectomy include but are not limited to: bleeding, infection, stroke, myocardial infarction, death, cranial nerve injuries both temporary and permanent, neck hematoma, possible airway compromise, labile blood pressure post-operatively, cerebral hyperperfusion syndrome, and possible need for additional interventions in the future.  The patient is aware of the risks and agrees to proceed forward with the procedure. Due to prior history of CABG and cardiac arrhythmia, will need cardiology clearance prior to proceeding as likely to get blocked by  Anesthesia.  I discussed in depth with the patient the nature of atherosclerosis, and emphasized the importance of maximal medical management including strict control of blood pressure, blood glucose, and lipid levels, antiplatelet agents, obtaining regular exercise, and cessation of smoking.    The patient is aware that without maximal medical management the underlying atherosclerotic disease process will progress, limiting the benefit of any interventions. The patient is currently on a statin: Lipitor. The patient is currently on an anti-platelet: ASA.  Thank you for allowing [place] to participate in this patient's care.   Adele Barthel, MD [facility] Office: [phone] Pager: [phone]  11/15/2015, 12:02 PM

## 2015-12-10 NOTE — Anesthesia Procedure Notes (Signed)
Procedure Name: Intubation Date/Time: 12/10/2015 7:44 AM Performed by: Barrington Ellison Pre-anesthesia Checklist: Patient identified, Emergency Drugs available, Suction available, Patient being monitored and Timeout performed Patient Re-evaluated:Patient Re-evaluated prior to inductionOxygen Delivery Method: Circle system utilized Preoxygenation: Pre-oxygenation with 100% oxygen Intubation Type: IV induction Ventilation: Mask ventilation without difficulty Laryngoscope Size: Mac and 3 Grade View: Grade I Tube type: Oral Tube size: 7.5 mm Number of attempts: 1 Airway Equipment and Method: Stylet and LTA kit utilized Placement Confirmation: ETT inserted through vocal cords under direct vision,  positive ETCO2 and breath sounds checked- equal and bilateral Secured at: 23 cm Tube secured with: Tape Dental Injury: Teeth and Oropharynx as per pre-operative assessment

## 2015-12-10 NOTE — Interval H&P Note (Signed)
History and Physical Interval Note:  12/10/2015 7:24 AM  Thomas Marshall  has presented today for surgery, with the diagnosis of Right carotid artery stenosis I65.21  The various methods of treatment have been discussed with the patient and family. After consideration of risks, benefits and other options for treatment, the patient has consented to  Procedure(s): ENDARTERECTOMY CAROTID (Right) as a surgical intervention .  The patient's history has been reviewed, patient examined, no change in status, stable for surgery.  I have reviewed the patient's chart and labs.  Questions were answered to the patient's satisfaction.     Adele Barthel

## 2015-12-10 NOTE — Transfer of Care (Signed)
Immediate Anesthesia Transfer of Care Note  Patient: Thomas Marshall  Procedure(s) Performed: Procedure(s): Right Carotid ENDARTERECTOMY (Right)  Patient Location: PACU  Anesthesia Type:General  Level of Consciousness: awake and oriented  Airway & Oxygen Therapy: Patient Spontanous Breathing and Patient connected to nasal cannula oxygen  Post-op Assessment: Report given to RN, Patient moving all extremities X 4 and Patient able to stick tongue midline  Post vital signs: Reviewed and stable  Last Vitals:  Filed Vitals:   12/10/15 0555  BP: 179/74  Pulse: 54  Temp: 36.4 C  Resp: 20    Complications: No apparent anesthesia complications

## 2015-12-10 NOTE — Op Note (Signed)
OPERATIVE NOTE  PROCEDURE:   1.  right carotid endarterectomy with primary repair 2.  Right intraoperative carotid ultrasound  PRE-OPERATIVE DIAGNOSIS: right asymptomatic carotid stenosis >80 %  POST-OPERATIVE DIAGNOSIS: same as above   SURGEON: Adele Barthel, MD  ASSISTANT(S): Gerri Lins, PAC   ANESTHESIA: general  ESTIMATED BLOOD LOSS: 100 cc  FINDING(S): 1.  Continuous doppler audible flow signatures are appropriate for each carotid artery. 2.  No evidence of intimal flap visualized on transverse or longitudinal ultrasonography. 3.  Necrotic near occluding carotid plaque 4.  Large carotid artery compatible with primary repair  SPECIMEN(S):  Carotid plaque (sent to Pathology)  INDICATIONS:   Thomas Marshall is a 75 y.o. male who presents with right asymptomatic carotid stenosis >80%.  I discussed with the patient the risks, benefits, and alternatives to carotid endarterectomy.  I discussed the procedural details of carotid endarterectomy with the patient.  The patient is aware that the risks of carotid endarterectomy include but are not limited to: bleeding, infection, stroke, myocardial infarction, death, cranial nerve injuries both temporary and permanent, neck hematoma, possible airway compromise, labile blood pressure post-operatively, cerebral hyperperfusion syndrome, and possible need for additional interventions in the future. The patient is aware of the risks and agrees to proceed forward with the procedure.  DESCRIPTION: After full informed written consent was obtained from the patient, the patient was brought back to the operating room and placed supine upon the operating table.  Prior to induction, the patient received IV antibiotics.  After obtaining adequate anesthesia, the patient was placed into semi-Fowler position with a shoulder roll in place and the patient's neck slightly hyperextended and rotated away from the surgical site.  The patient was prepped in the  standard fashion for a right carotid endarterectomy.  I made an incision anterior to the sternocleidomastoid muscle and dissected down through the subcutaneous tissue.  The platysmas was opened with electrocautery.  Then I dissected down to the internal jugular vein.  This was dissected posteriorly until I obtained visualization of the common carotid artery.  This was dissected out and then an umbilical tape was placed around the common carotid artery and I loosely applied a Rumel tourniquet.  I then dissected in a periadventitial fashion along the common carotid artery up to the bifurcation.  I then identified the external carotid artery and the superior thyroid artery.  A 2-0 silk tie was looped around the superior thyroid artery, and I also dissected out the external carotid artery and placed a vessel loop around it.  In continuing the dissection to the internal carotid artery, I identified the facial vein.  This was ligated and then transected, giving me improved exposure of the internal carotid artery.  In the process of this dissection, the hypoglossal nerve was identified.  I then dissected out the internal carotid artery until I identified an area of soft tissue in the internal carotid artery.  This area turned out to be extremely high, nearly at the level of angle of the mandible.  Around this time, the patient's pre-existing bradycardia decreased into the 30s, so I injected 1 cc of 1% lidocaine without epinephrine into the tissue overlying the carotid body.    Due to the high carotid bifurcation, I did not think a Rumel tourniquet could be applied to the internal carotid artery.   I dissected slightly distal to relatively disease free portion of the internal carotid artery and placed a vessel loop around the artery.  At this point, I  gave the patient a therapeutic bolus of Heparin intravenously, 10000 units.  After waiting 3 minutes, then I clamped the external carotid artery and then the common carotid  artery.  Using a butterfly needle connected to the arterial pressure circuit, I cannulated the common carotid artery distal to the clamp.  The stump pressure was measured at: 70 mm Hg.  Based on this measurement, I felt no shunt was needed.  I then made an arteriotomy in the common carotid artery with a 11 blade, and extended the arteriotomy with a Potts scissor down into the common carotid artery, then I carried the arteriotomy through the bifurcation into the internal carotid artery until I reached an area that was not diseased.  At this point, I started the endarterectomy in the common carotid artery with a Technical brewer and carried this dissection down into the common carotid artery circumferentially.  Then I transected the plaque at a segment where it was adherent.  I then carried this dissection up into the external carotid artery.  The plaque was extracted by unclamping the external carotid artery and everting the artery.  The dissection was then carried into the internal carotid artery, extracting the remaining portion of the carotid plaque.  I passed the plaque off the field as a specimen.  I then spent the next 30 minutes removing intimal flaps and loose debris.  Eventually I reached the point where the residual plaque was densely adherent and any further dissection would compromise the integrity of the wall.  After verifying that there was no more loose intimal flaps or debris, I re-interrogated the entirety of this carotid artery.  At this point, I was satisfied that the minimal remaining disease was densely adherent to the wall and wall integrity was intact.  I felt the carotid artery was large enough to primarily repair.  I repaired this carotid artery, running two running stitches of 6-0 Prolene, one from each end.  Prior this repair, I backbled the external carotid artery and then reclamped it.  Back bleeding was: vigorous.   I then backbled the internal carotid artery and then reclamped it.   Back bleeding was: excellent.   Finally, I backbled the common carotid artery and then reclamped it.  Back bleeding was: pulsatile.   I completed the repair in the usual fashion.  First, I released the clamp on the external carotid artery, then I released it on the common carotid artery.  After waiting a few seconds, I then released it on the internal carotid artery.  I then interrogated this patient's arteries with the continuous Doppler.  The audible waveforms in each artery were consistent with the expected characteristics for each artery.  The Sonosite probe was then sterilely draped and used to interrogate the carotid artery in both longitudinal and transverse views.  There was no intimal flap visualized.  At this point, I washed out the wound, and placed thrombin and Gelfoam throughout.  I also gave the patient 50 mg of protamine to reverse his anticoagulation.   After waiting a few minutes, I removed the thrombin and Gelfoam and washed out the wound.  There was no more active bleeding in the surgical site.   I then reapproximated the platysma muscle with a running stitch of 3-0 Vicryl.  The skin was then reapproximated with a running subcuticular 4-0 Monocryl stitch.  The skin was then cleaned, dried and Dermabond was used to reinforce the skin closure.  The patient woke without any problems, neurologically intact.  COMPLICATIONS: none  CONDITION: stable   Adele Barthel, MD [facility] Office: [phone] Pager: [phone]  12/10/2015, 10:21 AM

## 2015-12-10 NOTE — Progress Notes (Signed)
UR COMPLETED  

## 2015-12-10 NOTE — Anesthesia Preprocedure Evaluation (Addendum)
Anesthesia Evaluation  Patient identified by MRN, date of birth, ID band Patient awake    Reviewed: Allergy & Precautions, H&P , NPO status , Patient's Chart, lab work & pertinent test results  Airway Mallampati: II  TM Distance: >3 FB Neck ROM: Full    Dental no notable dental hx. (+) Edentulous Upper, Dental Advisory Given   Pulmonary neg pulmonary ROS, former smoker,    Pulmonary exam normal breath sounds clear to auscultation       Cardiovascular hypertension, On Medications + CAD, + CABG and + Peripheral Vascular Disease  + dysrhythmias (PACs on EKG)  Rhythm:Irregular Rate:Normal     Neuro/Psych negative neurological ROS  negative psych ROS   GI/Hepatic Neg liver ROS, GERD  Medicated and Controlled,  Endo/Other  diabetes, Type 2, Oral Hypoglycemic Agents  Renal/GU CRFRenal disease  negative genitourinary   Musculoskeletal   Abdominal   Peds  Hematology negative hematology ROS (+)   Anesthesia Other Findings   Reproductive/Obstetrics negative OB ROS                           Anesthesia Physical  Anesthesia Plan  ASA: III  Anesthesia Plan: General   Post-op Pain Management:    Induction: Intravenous  Airway Management Planned: Oral ETT  Additional Equipment: Arterial line  Intra-op Plan:   Post-operative Plan: Extubation in OR  Informed Consent: I have reviewed the patients History and Physical, chart, labs and discussed the procedure including the risks, benefits and alternatives for the proposed anesthesia with the patient or authorized representative who has indicated his/her understanding and acceptance.   Dental advisory given  Plan Discussed with: CRNA  Anesthesia Plan Comments: (Pending pt/ptt with warfarin being held 5 days prior, negative stress test 2014, s/p CABG in 2004)       Anesthesia Quick Evaluation

## 2015-12-10 NOTE — Anesthesia Postprocedure Evaluation (Signed)
Anesthesia Post Note  Patient: [name]  Procedure(s) Performed: Procedure(s) (LRB): Right Carotid ENDARTERECTOMY (Right)  Patient location during evaluation: PACU Anesthesia Type: General Level of consciousness: awake and alert Pain management: pain level controlled Vital Signs Assessment: post-procedure vital signs reviewed and stable Respiratory status: spontaneous breathing, nonlabored ventilation, respiratory function stable and patient connected to nasal cannula oxygen Cardiovascular status: blood pressure returned to baseline and stable Postop Assessment: no signs of nausea or vomiting Anesthetic complications: no    Last Vitals:  Filed Vitals:   12/10/15 1047 12/10/15 1100  BP: 144/69 133/65  Pulse: 51 48  Temp: 36.6 C   Resp: 9 11    Last Pain:  Filed Vitals:   12/10/15 1111  PainSc: 0-No pain                 Zenaida Deed

## 2015-12-11 ENCOUNTER — Encounter (HOSPITAL_COMMUNITY): Payer: Self-pay | Admitting: Vascular Surgery

## 2015-12-11 ENCOUNTER — Telehealth: Payer: Self-pay | Admitting: Vascular Surgery

## 2015-12-11 LAB — CBC
HCT: 31.9 % — ABNORMAL LOW (ref 39.0–52.0)
Hemoglobin: 10.6 g/dL — ABNORMAL LOW (ref 13.0–17.0)
MCH: 30.9 pg (ref 26.0–34.0)
MCHC: 33.2 g/dL (ref 30.0–36.0)
MCV: 93 fL (ref 78.0–100.0)
Platelets: 117 10*3/uL — ABNORMAL LOW (ref 150–400)
RBC: 3.43 MIL/uL — ABNORMAL LOW (ref 4.22–5.81)
RDW: 13.5 % (ref 11.5–15.5)
WBC: 6.5 10*3/uL (ref 4.0–10.5)

## 2015-12-11 LAB — BASIC METABOLIC PANEL
ANION GAP: 9 (ref 5–15)
BUN: 14 mg/dL (ref 6–20)
CALCIUM: 8.2 mg/dL — AB (ref 8.9–10.3)
CO2: 25 mmol/L (ref 22–32)
Chloride: 105 mmol/L (ref 101–111)
Creatinine, Ser: 1.15 mg/dL (ref 0.61–1.24)
GFR calc Af Amer: >60 mL/min (ref >60)
GFR calc non Af Amer: 60 mL/min — ABNORMAL LOW (ref >60)
GLUCOSE: 134 mg/dL — AB (ref 65–99)
Potassium: 3.8 mmol/L (ref 3.5–5.1)
Sodium: 139 mmol/L (ref 135–145)

## 2015-12-11 LAB — GLUCOSE, CAPILLARY
Glucose-Capillary: 137 mg/dL — ABNORMAL HIGH (ref 65–99)
Glucose-Capillary: 173 mg/dL — ABNORMAL HIGH (ref 65–99)

## 2015-12-11 LAB — PROTIME-INR
INR: 1.27 (ref 0.00–1.49)
Prothrombin Time: 16 seconds — ABNORMAL HIGH (ref 11.6–15.2)

## 2015-12-11 MED ORDER — OXYCODONE-ACETAMINOPHEN 5-325 MG PO TABS: 1.0000 | ORAL_TABLET | Freq: Four times a day (QID) | ORAL | Status: DC | PRN

## 2015-12-11 MED FILL — Thrombin For Soln 20000 Unit: CUTANEOUS | Qty: 1 | Status: AC

## 2015-12-11 NOTE — Progress Notes (Signed)
Discharge Note. Reviewed discharge instructions with patient and family at the bedside including follow up appointments that have been scheduled. Rx given and reviewed. All home medications also reviewed and when to take them. Care of the surgical site along with signs and symptoms of infection and when to call the MD education complete.

## 2015-12-11 NOTE — Telephone Encounter (Signed)
-----   Message from Mena Goes, RN sent at 12/11/2015  9:14 AM EST ----- Regarding: schedule   ----- Message -----    From: Alvia Grove, PA-C    Sent: 12/11/2015   7:17 AM      To: [facility] Charge Pool  S/p right CEA 12/10/15  F/u with [facility] in 2 weeks  Thanks Maudie Mercury

## 2015-12-11 NOTE — Progress Notes (Addendum)
  [facility] Progress Note  12/11/2015 7:33 AM 1 Day Post-Op  Subjective:  No complaints.   Tmax 98.5 BP sys 110s-160s 02 100% RA  Filed Vitals:   12/11/15 0253 12/11/15 0300  BP:  136/61  Pulse:  60  Temp: 98.4 F (36.9 C)   Resp:  13     Physical Exam: Neuro:  Smile symmetric, no tongue deviation, 5/5 strength upper and lower extremities Incision:  Right neck incision clean and intact. No hematoma.   CBC    Component Value Date/Time   WBC 5.4 12/06/2015 0931   WBC 5.8 07/09/2015 1700   RBC 3.93* 12/06/2015 0931   RBC 4.14* 07/09/2015 1700   HGB 11.8* 12/06/2015 0931   HGB 11.8* 07/09/2015 1700   HCT 37.0* 12/06/2015 0931   HCT 37.4* 07/09/2015 1700   PLT 138* 12/06/2015 0931   MCV 94.1 12/06/2015 0931   MCV 90.3 07/09/2015 1700   MCH 30.0 12/06/2015 0931   MCH 28.6 07/09/2015 1700   MCHC 31.9 12/06/2015 0931   MCHC 31.6* 07/09/2015 1700   RDW 13.5 12/06/2015 0931   LYMPHSABS 1.6 12/04/2013 1430   MONOABS 0.7 12/04/2013 1430   EOSABS 0.4 12/04/2013 1430   BASOSABS 0.0 12/04/2013 1430    BMET    Component Value Date/Time   NA 139 12/11/2015 0631   K 3.8 12/11/2015 0631   CL 105 12/11/2015 0631   CO2 25 12/11/2015 0631   GLUCOSE 134* 12/11/2015 0631   BUN 14 12/11/2015 0631   CREATININE 1.15 12/11/2015 0631   CREATININE 1.42* 05/13/2015 1635   CALCIUM 8.2* 12/11/2015 0631   GFRNONAA 60* 12/11/2015 0631   GFRNONAA 48* 05/13/2015 1635   GFRAA >60 12/11/2015 0631   GFRAA 55* 05/13/2015 1635     Intake/Output Summary (Last 24 hours) at 12/11/15 0733 Last data filed at 12/11/15 0301  Gross per 24 hour  Intake   2620 ml  Output   1300 ml  Net   1320 ml      Assessment/Plan:  This is a 75 y.o. male who is s/p right CEA 1 Day Post-Op  -Patient is doing well this am. -Neuro exam is intact -has not ambulated, will need to ambulate prior to discharge -has voided -Modified rankin: 0 -VQI: is on ASA and statin.  -May continue  coumadin today. Has f/u in coumadin clinic.  -D/c home today after ambulating. F/u with Dr. Bridgett Larsson in 2 weeks.    Virgina Jock, PA-C [facility] Office: [phone] Pager: [phone] 12/11/2015 7:33 AM  Addendum  I have independently interviewed and examined the patient, and I agree with the physician assistant's findings.  No neck hematoma.  Inc neuro.  HD stable.  Ok to D/C on has eaten breakfast and ambulated   Adele Barthel, MD [facility] [place] Office: [phone] Pager: [phone]  12/11/2015, 8:16 AM

## 2015-12-11 NOTE — Telephone Encounter (Signed)
Pt to be informed by discharge RN

## 2015-12-13 NOTE — Discharge Summary (Signed)
[facility] Discharge Summary  Thomas Marshall Nov 23, 1941 75 y.o. male  [id number]  Admission Date: 12/10/2015  Discharge Date: 12/11/2015  Physician: Adele Barthel, MD  Admission Diagnosis: Right carotid artery stenosis I65.21  HPI:   This is a 75 y.o. male who presented with chief complaint: routine follow up. Previous carotid studies demonstrated: RICA 60-79% (high) stenosis, LICA [id number] stenosis. Patient has no history of TIA or stroke symptom. The patient has never had amaurosis fugax or monocular blindness. The patient has never had facial drooping or hemiplegia. The patient has never had receptive or expressive aphasia.   Hospital Course:  The patient was admitted to the hospital and taken to the operating room on 12/10/2015 and underwent right carotid endarterectomy with primary repair. The patient tolerated the procedure well and was transported to the PACU in stable condition.  By POD 1, the patient's neuro status was intact. The patient's right neck incision was healing well without evidence of a hematoma. He was hemodynamically stable. He was tolerating a diet, ambulating, and voiding without difficulty. His Coumadin was restarted. He was discharged home on postop day 1 in good condition.    Recent Labs  12/11/15 0631  NA 139  K 3.8  CL 105  CO2 25  GLUCOSE 134*  BUN 14  CALCIUM 8.2*    Recent Labs  12/11/15 0631  WBC 6.5  HGB 10.6*  HCT 31.9*  PLT 117*    Recent Labs  12/11/15 0631  INR 1.27    Discharge Instructions:   The patient is discharged to home with extensive instructions on wound care and progressive ambulation.  They are instructed not to drive or perform any heavy lifting until returning to see the physician in his office.  Discharge Instructions    Call MD for:  redness, tenderness, or signs of infection (pain, swelling, bleeding, redness, odor or green/yellow discharge around incision site)    Complete by:  As  directed      Call MD for:  severe or increased pain, loss or decreased feeling  in affected limb(s)    Complete by:  As directed      Call MD for:  temperature >100.5    Complete by:  As directed      Discharge instructions    Complete by:  As directed   You may shower 24 hours from surgery completion     Driving Restrictions    Complete by:  As directed   No driving for 1 week     Increase activity slowly    Complete by:  As directed   Walk with assistance use walker or cane as needed     Lifting restrictions    Complete by:  As directed   No lifting for 6 weeks     Resume previous diet    Complete by:  As directed            Discharge Diagnosis:  Right carotid artery stenosis I65.21  Secondary Diagnosis: Patient Active Problem List   Diagnosis Date Noted  . Carotid artery disease ([place]) 12/10/2015  . Carotid artery disease without cerebral infarction ([place]) 11/15/2015  . Right facial swelling 08/06/2015  . BPH (benign prostatic hypertrophy) 08/06/2015  . Abscess of left thigh 07/16/2015  . Cellulitis and abscess of leg 07/09/2015  . CKD (chronic kidney disease) stage 3, GFR 30-59 ml/min 05/14/2015  . Carotid stenosis 05/01/2015  . PVD (peripheral vascular disease) ([place]) 04/09/2015  . AAA (abdominal aortic aneurysm) (  [place]) 04/09/2015  . S/P CABG x 4 2004 09/10/2014  . Atrial fibrillation ([place]) 09/04/2013  . Long term current use of anticoagulant therapy 09/04/2013  . Hx of squamous cell carcinoma of skin   . Type 2 diabetes mellitus with renal manifestations ([place])   . GERD (gastroesophageal reflux disease)   . Hyperlipidemia   . Hypertension    Past Medical History  Diagnosis Date  . Cancer (HCC)     skin & squamous cell  . Diabetes mellitus without complication ([place])   . GERD (gastroesophageal reflux disease)   . Hyperlipidemia   . Hypertension   . Coronary artery disease   . PAF (paroxysmal atrial fibrillation) ([place])   . Dysrhythmia   . Pneumonia   .  Arthritis       Medication List    TAKE these medications        aspirin 81 MG tablet  Take 81 mg by mouth daily.     atorvastatin 40 MG tablet  Commonly known as:  LIPITOR  Take 40 mg by mouth daily.     cholecalciferol 1000 units tablet  Commonly known as:  VITAMIN D  Take 1,000 Units by mouth daily.     cloNIDine 0.1 MG tablet  Commonly known as:  CATAPRES  Take 1 tablet (0.1 mg total) by mouth 2 (two) times daily.     metFORMIN 1000 MG tablet  Commonly known as:  GLUCOPHAGE  Take 1 tablet (1,000 mg total) by mouth daily after supper.     nitroGLYCERIN 0.4 MG SL tablet  Commonly known as:  NITROSTAT  Place 1 tablet (0.4 mg total) under the tongue every 5 (five) minutes as needed for chest pain.     omeprazole 20 MG capsule  Commonly known as:  PRILOSEC  Take 20 mg by mouth daily.     oxyCODONE-acetaminophen 5-325 MG tablet  Commonly known as:  PERCOCET/ROXICET  Take 1-2 tablets by mouth every 6 (six) hours as needed for moderate pain.     spironolactone 25 MG tablet  Commonly known as:  ALDACTONE  TAKE ONE TABLET BY MOUTH ONCE DAILY     vitamin C 500 MG tablet  Commonly known as:  ASCORBIC ACID  Take 500 mg by mouth daily.     vitamin E 400 UNIT capsule  Take 400 Units by mouth daily.     warfarin 10 MG tablet  Commonly known as:  COUMADIN  Take 10 mg by mouth daily.        Percocet #15 No Refill  Disposition: Home  Patient's condition: is Good  Follow up: 1. Dr.  Bridgett Larsson in 2 weeks.   Virgina Jock, PA-C [facility] [phone]  --- For [facility] use --- Instructions: Press F2 to tab through selections.  Delete question if not applicable.   Modified Rankin score at D/C (0-6): 0  IV medication needed for:  1. Hypertension: No 2. Hypotension: No  Post-op Complications: No  1. Post-op CVA or TIA: No  2. CN injury: No  3. Myocardial infarction: No  4.  CHF: No  5.  Dysrhythmia (new): No  6. Wound  infection: No  7. Reperfusion symptoms: No  8. Return to OR: No  Discharge medications: Statin use:  Yes If No: [ ]  For Medical reasons, [ ]  Non-compliant, [ ]  Not-indicated ASA use:  Yes  If No: [ ]  For Medical reasons, [ ]  Non-compliant, [ ]  Not-indicated Beta blocker use:  No If No: [ ]   For Medical reasons, [ ]  Non-compliant, [x]  Not-indicated ACE-Inhibitor use:  No If No: [ ]  For Medical reasons, [ ]  Non-compliant, [x]  Not-indicated P2Y12 Antagonist use: No, [ ]  Plavix, [ ]  Plasugrel, [ ]  Ticlopinine, [ ]  Ticagrelor, [ ]  Other, [ ]  No for medical reason, [ ]  Non-compliant, [x]  Not-indicated Anti-coagulant use:  Yes, [ x] Warfarin, [ ]  Rivaroxaban, [ ]  Dabigatran, [ ]  Other, [ ]  No for medical reason, [ ]  Non-compliant, [ ]  Not-indicated  Addendum  I have independently interviewed and examined the patient, and I agree with the physician assistant's findings.  This patient underwent a R CEA for asx R ICA stenosis >80%.  The patient had a very high lesion, so shunting was not possible.  This patient had no sequalae with no neck hematoma or neurologic events post-operatively.  He will follow up in the office in two weeks for wound check.  Adele Barthel, MD [facility] [place] Office: [phone] Pager: [phone]  12/13/2015, 4:31 PM

## 2015-12-17 ENCOUNTER — Encounter: Payer: Self-pay | Admitting: Vascular Surgery

## 2015-12-18 ENCOUNTER — Telehealth: Payer: Self-pay | Admitting: Cardiovascular Disease

## 2015-12-18 ENCOUNTER — Ambulatory Visit (INDEPENDENT_AMBULATORY_CARE_PROVIDER_SITE_OTHER): Payer: Medicare Other | Admitting: *Deleted

## 2015-12-18 DIAGNOSIS — Z7901 Long term (current) use of anticoagulants: Secondary | ICD-10-CM

## 2015-12-18 DIAGNOSIS — I4891 Unspecified atrial fibrillation: Secondary | ICD-10-CM | POA: Diagnosis not present

## 2015-12-18 LAB — POCT INR: INR: 1.6

## 2015-12-18 NOTE — Telephone Encounter (Signed)
New message:  Pt came in for coumadin and was under the impression that he had an appt with Dr. Burt Knack.  He said that they made it when he was discharged from the [facility].  I did not see anything.  Would you please check and call the patient and advise.

## 2015-12-25 ENCOUNTER — Ambulatory Visit (INDEPENDENT_AMBULATORY_CARE_PROVIDER_SITE_OTHER): Payer: Medicare Other | Admitting: Vascular Surgery

## 2015-12-25 ENCOUNTER — Encounter: Payer: Self-pay | Admitting: Vascular Surgery

## 2015-12-25 VITALS — BP 120/63 | HR 54 | Temp 98.3°F | Resp 14 | Ht 74.0 in | Wt 228.0 lb

## 2015-12-25 DIAGNOSIS — I779 Disorder of arteries and arterioles, unspecified: Secondary | ICD-10-CM

## 2015-12-25 DIAGNOSIS — I739 Peripheral vascular disease, unspecified: Principal | ICD-10-CM

## 2015-12-25 DIAGNOSIS — I714 Abdominal aortic aneurysm, without rupture, unspecified: Secondary | ICD-10-CM

## 2015-12-25 NOTE — Progress Notes (Signed)
Postoperative Visit   History of Present Illness  Thomas Marshall is a 75 y.o. male who presents for postoperative follow-up for: L CEA (Date: 12/10/15).  The patient's neck incision is healed.  The patient has had no stroke or TIA symptoms.  For VQI Use Only  PRE-ADM LIVING: Home  AMB STATUS: Ambulatory  Social History   Social History  . Marital Status: Married    Spouse Name: N/A  . Number of Children: N/A  . Years of Education: N/A   Occupational History  . Not on file.   Social History Main Topics  . Smoking status: Former Smoker -- 1.00 packs/day for 13 years    Quit date: 11/30/1970  . Smokeless tobacco: Never Used  . Alcohol Use: No  . Drug Use: No  . Sexual Activity: Yes   Other Topics Concern  . Not on file   Social History Narrative    Current Outpatient Prescriptions on File Prior to Visit  Medication Sig Dispense Refill  . aspirin 81 MG tablet Take 81 mg by mouth daily.    [name] atorvastatin (LIPITOR) 40 MG tablet Take 40 mg by mouth daily.    . cholecalciferol (VITAMIN D) 1000 UNITS tablet Take 1,000 Units by mouth daily.    . cloNIDine (CATAPRES) 0.1 MG tablet Take 1 tablet (0.1 mg total) by mouth 2 (two) times daily. 180 tablet 3  . metFORMIN (GLUCOPHAGE) 1000 MG tablet Take 1 tablet (1,000 mg total) by mouth daily after supper. 30 tablet 1  . nitroGLYCERIN (NITROSTAT) 0.4 MG SL tablet Place 1 tablet (0.4 mg total) under the tongue every 5 (five) minutes as needed for chest pain. 25 tablet 3  . omeprazole (PRILOSEC) 20 MG capsule Take 20 mg by mouth daily.    [name] oxyCODONE-acetaminophen (PERCOCET/ROXICET) 5-325 MG tablet Take 1-2 tablets by mouth every 6 (six) hours as needed for moderate pain. 15 tablet 0  . spironolactone (ALDACTONE) 25 MG tablet TAKE ONE TABLET BY MOUTH ONCE DAILY 90 tablet 1  . vitamin C (ASCORBIC ACID) 500 MG tablet Take 500 mg by mouth daily.    . vitamin E 400 UNIT capsule Take 400 Units by mouth daily.    [name] warfarin (COUMADIN) 10  MG tablet Take 10 mg by mouth daily.     No current facility-administered medications on file prior to visit.    Physical Examination  Filed Vitals:   12/25/15 1033 12/25/15 1036  BP: 125/67 120/63  Pulse: 56 54  Temp:  98.3 F (36.8 C)  Resp:  14    R Neck: Incision is healed Neuro: CN 2-12 are intact, Motor strength is 5/5 bilaterally, sensation is grossly intact  Medical Decision Making  Thomas Marshall is a 75 y.o. male who presents s/p R CEA, small AAA  The patient's neck incision is healing with no stroke symptoms. I discussed in depth with the patient the nature of atherosclerosis, and emphasized the importance of maximal medical management including strict control of blood pressure, blood glucose, and lipid levels, obtaining regular exercise, anti-platelet use and cessation of smoking.   The patient is currently on an antiplatelet: ASA. The patient is currently on a statin: Lipitor. The patient is aware that without maximal medical management the underlying atherosclerotic disease process will progress, limiting the benefit of any interventions. The patient's surveillance will included routine carotid duplex studies which will be completed in: 9 months, at which time the patient will be re-evaluated.   I emphasized the  importance of routine surveillance of the carotid arteries as recurrence of stenosis is possible, especially with proper management of underlying atherosclerotic disease. The patient agrees to participate in their maximal medical care and routine surveillance. Additionally, the patient is overdue to his AAA duplex.  I have ordered this for one month from now.  Thank you for allowing [place] to participate in this patient's care.  Adele Barthel, MD [facility] [place] Office: [phone] Pager: [phone]

## 2015-12-25 NOTE — Addendum Note (Signed)
Addended by: Mena Goes on: 12/25/2015 04:12 PM   Modules accepted: Orders

## 2015-12-31 ENCOUNTER — Other Ambulatory Visit: Payer: Self-pay | Admitting: Cardiovascular Disease

## 2015-12-31 ENCOUNTER — Other Ambulatory Visit: Payer: Self-pay | Admitting: Vascular Surgery

## 2015-12-31 ENCOUNTER — Ambulatory Visit (INDEPENDENT_AMBULATORY_CARE_PROVIDER_SITE_OTHER): Payer: Medicare Other | Admitting: Vascular Surgery

## 2015-12-31 ENCOUNTER — Encounter: Payer: Self-pay | Admitting: Vascular Surgery

## 2015-12-31 VITALS — BP 130/68 | HR 63 | Temp 97.3°F | Resp 16 | Ht 74.0 in | Wt 228.0 lb

## 2015-12-31 DIAGNOSIS — T148 Other injury of unspecified body region: Secondary | ICD-10-CM | POA: Diagnosis not present

## 2015-12-31 DIAGNOSIS — T148XXA Other injury of unspecified body region, initial encounter: Secondary | ICD-10-CM

## 2015-12-31 DIAGNOSIS — L24A9 Irritant contact dermatitis due friction or contact with other specified body fluids: Secondary | ICD-10-CM

## 2015-12-31 DIAGNOSIS — I779 Disorder of arteries and arterioles, unspecified: Secondary | ICD-10-CM | POA: Diagnosis not present

## 2015-12-31 MED ORDER — CLINDAMYCIN HCL 150 MG PO CAPS: 150.0000 mg | ORAL_CAPSULE | Freq: Three times a day (TID) | ORAL | Status: DC

## 2015-12-31 NOTE — Progress Notes (Signed)
POST OPERATIVE OFFICE NOTE    CC:  F/u for surgery  HPI:  This is a 75 y.o. male who is s/p right carotid endarterectomy with primary repair on 12/10/15 by Dr. Bridgett Larsson.  He presents today.  He states he saw Dr. Bridgett Larsson on 12/25/15 and his right neck incision started draining.  He and his wife states that it started draining puss and kind of a greenish color yesterday.  He has not had any fever or chills.  He is on coumadin for Afib and his last INR check last week was 3.2.  He goes tomorrow for another check.  He is allergic to Amoxicillin and Ampicillin, which he breaks out in a rash.  Allergies  Allergen Reactions  . Lisinopril Swelling and Other (See Comments)    Angioedema.   . Losartan Swelling and Other (See Comments)    Angioedema   . Nifedipine Swelling  . Triamterene Swelling  . Amoxicillin Rash  . Ampicillin Rash  . Carvedilol Other (See Comments)    Low heart rate     Current Outpatient Prescriptions  Medication Sig Dispense Refill  . aspirin 81 MG tablet Take 81 mg by mouth daily.    [name] atorvastatin (LIPITOR) 40 MG tablet Take 40 mg by mouth daily.    . cholecalciferol (VITAMIN D) 1000 UNITS tablet Take 1,000 Units by mouth daily.    . cloNIDine (CATAPRES) 0.1 MG tablet Take 1 tablet (0.1 mg total) by mouth 2 (two) times daily. 180 tablet 3  . metFORMIN (GLUCOPHAGE) 1000 MG tablet Take 1 tablet (1,000 mg total) by mouth daily after supper. 30 tablet 1  . nitroGLYCERIN (NITROSTAT) 0.4 MG SL tablet Place 1 tablet (0.4 mg total) under the tongue every 5 (five) minutes as needed for chest pain. 25 tablet 3  . omeprazole (PRILOSEC) 20 MG capsule Take 20 mg by mouth daily.    [name] oxyCODONE-acetaminophen (PERCOCET/ROXICET) 5-325 MG tablet Take 1-2 tablets by mouth every 6 (six) hours as needed for moderate pain. (Patient not taking: Reported on 12/25/2015) 15 tablet 0  . spironolactone (ALDACTONE) 25 MG tablet TAKE ONE TABLET BY MOUTH ONCE DAILY 90 tablet 1  . vitamin C (ASCORBIC ACID)  500 MG tablet Take 500 mg by mouth daily.    . vitamin E 400 UNIT capsule Take 400 Units by mouth daily.    [name] warfarin (COUMADIN) 10 MG tablet Take 10 mg by mouth daily.     No current facility-administered medications for this visit.     ROS:  See HPI  Physical Exam:  Filed Vitals:   12/31/15 1421 12/31/15 1422  BP: 145/70 130/68  Pulse: 61 63  Temp:    Resp:       Incision:  Healing nicely, but does have drainage from the distal portion of the incision.  Expressed yellowish purulent drainage from the distal incision Neuro: in tact  Assessment/Plan:  This is a 75 y.o. male who is s/p: Right carotid endarterectomy with primary closure by Dr. Bridgett Larsson on 12/10/15 with new drainage from the incision   -some purulent drainage from the right neck incision most likely from the knot of the suture - this was cultured.  The pt will call on Friday to see if the culture has any results.  -he was given Clindaymycin 150mg  tid x 10 days.  -he is given clindamycin as he is on coumadin and has allergies to amoxicillin and ampicillin  -the distal wound was packed with the corner of a wet 2x2  gauze -will get HHRN to come out daily to do wet to dry dressing changes to distal incision with a wick type dressing.   -f/u with Dr. Bridgett Larsson on 01/10/16 -he will call sooner if he develops fever/chills   Leontine Locket, PA-C [facility] [phone]  Clinic MD:  Pt seen and examined with Dr. Kellie Simmering  agree with above assessment Appears to be suture abscess at lower pole of incision  Incision was opened about 1 and a half centimeters an wound cultured  Patient to return in 10 days C Dr. Bridgett Larsson with daily wound care in the interim and clindamycin

## 2015-12-31 NOTE — Progress Notes (Signed)
Filed Vitals:   12/31/15 1416 12/31/15 1418 12/31/15 1421 12/31/15 1422  BP: 151/78 156/73 145/70 130/68  Pulse: 61 62 61 63  Temp:  97.3 F (36.3 C)    TempSrc:  Oral    Resp:  16    Height:  6\' 2"  (1.88 m)    Weight:  228 lb (103.42 kg)    SpO2:  99%

## 2016-01-01 ENCOUNTER — Ambulatory Visit (INDEPENDENT_AMBULATORY_CARE_PROVIDER_SITE_OTHER): Payer: Medicare Other | Admitting: Pharmacist

## 2016-01-01 ENCOUNTER — Telehealth: Payer: Self-pay | Admitting: *Deleted

## 2016-01-01 DIAGNOSIS — I4891 Unspecified atrial fibrillation: Secondary | ICD-10-CM | POA: Diagnosis not present

## 2016-01-01 DIAGNOSIS — Z7901 Long term (current) use of anticoagulants: Secondary | ICD-10-CM

## 2016-01-01 LAB — POCT INR: INR: 2.3

## 2016-01-01 NOTE — Telephone Encounter (Signed)
Called [facility] to confirm that [facility] would provide home care to patient as ordered by Dr Kellie Simmering during the office visit on 12/31/15.  [facility] states that [facility] will begin seeing the patient today.  I faxed the order,demographics and office note to [phone] and received confirmation of the fax.  I called Mr Hayden to inform him of the status.

## 2016-01-02 DIAGNOSIS — Z7901 Long term (current) use of anticoagulants: Secondary | ICD-10-CM | POA: Diagnosis not present

## 2016-01-02 DIAGNOSIS — Z7984 Long term (current) use of oral hypoglycemic drugs: Secondary | ICD-10-CM | POA: Diagnosis not present

## 2016-01-02 DIAGNOSIS — I4891 Unspecified atrial fibrillation: Secondary | ICD-10-CM | POA: Diagnosis not present

## 2016-01-02 DIAGNOSIS — Z951 Presence of aortocoronary bypass graft: Secondary | ICD-10-CM | POA: Diagnosis not present

## 2016-01-02 DIAGNOSIS — E119 Type 2 diabetes mellitus without complications: Secondary | ICD-10-CM | POA: Diagnosis not present

## 2016-01-02 DIAGNOSIS — T814XXA Infection following a procedure, initial encounter: Secondary | ICD-10-CM | POA: Diagnosis not present

## 2016-01-03 ENCOUNTER — Encounter: Payer: Self-pay | Admitting: Vascular Surgery

## 2016-01-03 ENCOUNTER — Encounter (HOSPITAL_COMMUNITY): Payer: Medicare Other

## 2016-01-03 ENCOUNTER — Ambulatory Visit: Payer: Medicare Other | Admitting: Vascular Surgery

## 2016-01-03 ENCOUNTER — Encounter: Payer: Self-pay | Admitting: Family

## 2016-01-03 ENCOUNTER — Ambulatory Visit (INDEPENDENT_AMBULATORY_CARE_PROVIDER_SITE_OTHER): Payer: Medicare Other | Admitting: Family

## 2016-01-03 VITALS — BP 154/83 | HR 78 | Temp 97.1°F | Ht 74.0 in | Wt 224.4 lb

## 2016-01-03 DIAGNOSIS — T814XXA Infection following a procedure, initial encounter: Secondary | ICD-10-CM | POA: Diagnosis not present

## 2016-01-03 DIAGNOSIS — I6523 Occlusion and stenosis of bilateral carotid arteries: Secondary | ICD-10-CM

## 2016-01-03 DIAGNOSIS — Z7984 Long term (current) use of oral hypoglycemic drugs: Secondary | ICD-10-CM | POA: Diagnosis not present

## 2016-01-03 DIAGNOSIS — T814XXD Infection following a procedure, subsequent encounter: Secondary | ICD-10-CM

## 2016-01-03 DIAGNOSIS — E119 Type 2 diabetes mellitus without complications: Secondary | ICD-10-CM | POA: Diagnosis not present

## 2016-01-03 DIAGNOSIS — I4891 Unspecified atrial fibrillation: Secondary | ICD-10-CM | POA: Diagnosis not present

## 2016-01-03 DIAGNOSIS — IMO0001 Reserved for inherently not codable concepts without codable children: Secondary | ICD-10-CM

## 2016-01-03 DIAGNOSIS — Z7901 Long term (current) use of anticoagulants: Secondary | ICD-10-CM | POA: Diagnosis not present

## 2016-01-03 DIAGNOSIS — Z9889 Other specified postprocedural states: Secondary | ICD-10-CM

## 2016-01-03 DIAGNOSIS — Z951 Presence of aortocoronary bypass graft: Secondary | ICD-10-CM | POA: Diagnosis not present

## 2016-01-03 LAB — WOUND CULTURE: Gram Stain: NONE SEEN

## 2016-01-03 NOTE — Progress Notes (Signed)
Postoperative Visit   History of Present Illness  Thomas Marshall is a 75 y.o. male who is s/p right carotid endarterectomy with primary repair on 12/10/15 by Dr. Bridgett Larsson for asymptomatic >80% stenosis.  He presents today as he states the [facility] nurse advised him to be evaluated for the type of drainage that he has from the lower aspect of his right side neck incision.   He and his wife states that it started draining puss and kind of a greenish color on 12/30/15. He has not had any fever or chills. He is on coumadin for Afib.  He is allergic to Amoxicillin and Ampicillin, which he breaks out in a rash.  On 12/31/15 he saw Dr. Kellie Simmering and Leontine Locket, PA-C.  At that time he had some purulent drainage from the right neck incision most likely from the knot of the suture - this was cultured. The pt was to call on Friday to see if the culture has any results. -he was given Clindaymycin 150mg  tid x 10 days.  -he is given clindamycin as he is on coumadin and has allergies to amoxicillin and ampicillin  -the distal wound was packed with the corner of a wet 2x2 gauze -will get HHRN to come out daily to do wet to dry dressing changes to distal incision with a wick type dressing.  -f/u with Dr. Bridgett Larsson on 01/10/16 -he will call sooner if he develops fever/chills Appears to be suture abscess at lower pole of incision  Incision was opened about 1 and a half centimeters an wound cultured  Patient to return in 10 days C Dr. Bridgett Larsson with daily wound care in the interim and clindamycin.    For VQI Use Only  PRE-ADM LIVING: Home  AMB STATUS: Ambulatory  Social History   Social History  . Marital Status: Married    Spouse Name: N/A  . Number of Children: N/A  . Years of Education: N/A   Occupational History  . Not on file.   Social History Main Topics  . Smoking status: Former Smoker -- 1.00 packs/day for 13 years    Quit date: 11/30/1970  . Smokeless tobacco: Never Used  . Alcohol Use: No    . Drug Use: No  . Sexual Activity: Yes   Other Topics Concern  . Not on file   Social History Narrative    Current Outpatient Prescriptions on File Prior to Visit  Medication Sig Dispense Refill  . aspirin 81 MG tablet Take 81 mg by mouth daily.    [name] atorvastatin (LIPITOR) 40 MG tablet Take 40 mg by mouth daily.    . cholecalciferol (VITAMIN D) 1000 UNITS tablet Take 1,000 Units by mouth daily.    . cloNIDine (CATAPRES) 0.1 MG tablet TAKE ONE TABLET BY MOUTH TWICE DAILY 180 tablet 2  . metFORMIN (GLUCOPHAGE) 1000 MG tablet Take 1 tablet (1,000 mg total) by mouth daily after supper. 30 tablet 1  . nitroGLYCERIN (NITROSTAT) 0.4 MG SL tablet Place 1 tablet (0.4 mg total) under the tongue every 5 (five) minutes as needed for chest pain. 25 tablet 3  . omeprazole (PRILOSEC) 20 MG capsule Take 20 mg by mouth daily.    [name] oxyCODONE-acetaminophen (PERCOCET/ROXICET) 5-325 MG tablet Take 1-2 tablets by mouth every 6 (six) hours as needed for moderate pain. 15 tablet 0  . spironolactone (ALDACTONE) 25 MG tablet TAKE ONE TABLET BY MOUTH ONCE DAILY 90 tablet 1  . vitamin C (ASCORBIC ACID) 500 MG tablet Take  500 mg by mouth daily.    . vitamin E 400 UNIT capsule Take 400 Units by mouth daily.    [name] warfarin (COUMADIN) 10 MG tablet Take 10 mg by mouth daily.    . clindamycin (CLEOCIN) 150 MG capsule Take 1 capsule (150 mg total) by mouth 3 (three) times daily. (Patient not taking: Reported on 01/03/2016) 30 capsule 1   No current facility-administered medications on file prior to visit.    Physical Examination  Filed Vitals:   01/03/16 1537 01/03/16 1540  BP: 158/94 154/83  Pulse: 78   Temp: 97.1 F (36.2 C)   TempSrc: Oral   Height: 6\' 2"  (1.88 m)   Weight: 224 lb 6.4 oz (101.787 kg)   SpO2: 95%    Body mass index is 28.8 kg/(m^2).  Right side of neck: Incision is healing. Lower pole of incision is draining a small amount of bloody-purulent drainage. Minimal swelling at incision, minimal  erythema. Neuro: CN 2-12 are intact, Motor strength is 5/5 bilaterally, sensation is grossly intact  Medical Decision Making  Thomas Marshall is a 75 y.o. male who presents s/p right CEA on 12/10/15.       Dr. Bridgett Larsson used lidocaine local anesthetic at the lower pole of pt's incision and used a sterile cotton swab to track up the incision to evaluate the extent and type of drainage collection. Little bloody drainage present. Dr. Bridgett Larsson then placed a sterile 2x2 gauze wick in the incision. Wound dressed with another 2x2 gauze and secured with tape. Continue HH dressing changes, continue clindamycin until finished.  Follow up with Dr. Bridgett Larsson as already scheduled on 01/10/16.   Ota Ebersole, Sharmon Leyden, RN, MSN, FNP-C [facility] [place] Office: [phone]  01/03/2016, 4:24 PM  Clinic MD: Bridgett Larsson

## 2016-01-04 DIAGNOSIS — I4891 Unspecified atrial fibrillation: Secondary | ICD-10-CM | POA: Diagnosis not present

## 2016-01-04 DIAGNOSIS — T814XXA Infection following a procedure, initial encounter: Secondary | ICD-10-CM | POA: Diagnosis not present

## 2016-01-04 DIAGNOSIS — Z7984 Long term (current) use of oral hypoglycemic drugs: Secondary | ICD-10-CM | POA: Diagnosis not present

## 2016-01-04 DIAGNOSIS — E119 Type 2 diabetes mellitus without complications: Secondary | ICD-10-CM | POA: Diagnosis not present

## 2016-01-04 DIAGNOSIS — Z951 Presence of aortocoronary bypass graft: Secondary | ICD-10-CM | POA: Diagnosis not present

## 2016-01-04 DIAGNOSIS — Z7901 Long term (current) use of anticoagulants: Secondary | ICD-10-CM | POA: Diagnosis not present

## 2016-01-06 DIAGNOSIS — Z7901 Long term (current) use of anticoagulants: Secondary | ICD-10-CM | POA: Diagnosis not present

## 2016-01-06 DIAGNOSIS — E119 Type 2 diabetes mellitus without complications: Secondary | ICD-10-CM | POA: Diagnosis not present

## 2016-01-06 DIAGNOSIS — Z7984 Long term (current) use of oral hypoglycemic drugs: Secondary | ICD-10-CM | POA: Diagnosis not present

## 2016-01-06 DIAGNOSIS — I4891 Unspecified atrial fibrillation: Secondary | ICD-10-CM | POA: Diagnosis not present

## 2016-01-06 DIAGNOSIS — T814XXA Infection following a procedure, initial encounter: Secondary | ICD-10-CM | POA: Diagnosis not present

## 2016-01-06 DIAGNOSIS — Z951 Presence of aortocoronary bypass graft: Secondary | ICD-10-CM | POA: Diagnosis not present

## 2016-01-08 ENCOUNTER — Encounter: Payer: Self-pay | Admitting: Podiatry

## 2016-01-08 ENCOUNTER — Ambulatory Visit (INDEPENDENT_AMBULATORY_CARE_PROVIDER_SITE_OTHER): Payer: Medicare Other | Admitting: Podiatry

## 2016-01-08 DIAGNOSIS — M79674 Pain in right toe(s): Secondary | ICD-10-CM

## 2016-01-08 DIAGNOSIS — E119 Type 2 diabetes mellitus without complications: Secondary | ICD-10-CM | POA: Diagnosis not present

## 2016-01-08 DIAGNOSIS — B351 Tinea unguium: Secondary | ICD-10-CM | POA: Diagnosis not present

## 2016-01-08 DIAGNOSIS — M79675 Pain in left toe(s): Secondary | ICD-10-CM | POA: Diagnosis not present

## 2016-01-08 DIAGNOSIS — Z7901 Long term (current) use of anticoagulants: Secondary | ICD-10-CM | POA: Diagnosis not present

## 2016-01-08 DIAGNOSIS — I4891 Unspecified atrial fibrillation: Secondary | ICD-10-CM | POA: Diagnosis not present

## 2016-01-08 DIAGNOSIS — Z951 Presence of aortocoronary bypass graft: Secondary | ICD-10-CM | POA: Diagnosis not present

## 2016-01-08 DIAGNOSIS — T814XXA Infection following a procedure, initial encounter: Secondary | ICD-10-CM | POA: Diagnosis not present

## 2016-01-08 DIAGNOSIS — Z7984 Long term (current) use of oral hypoglycemic drugs: Secondary | ICD-10-CM | POA: Diagnosis not present

## 2016-01-08 NOTE — Progress Notes (Signed)
Patient ID: Thomas Marshall, male   DOB: Jul 09, 1941, 75 y.o.   MRN: [id number]  Subjective: This patient presents today for scheduled visit complaining of thickened and elongated toenails which are comfortable walking wearing shoes and requests toenail debridement. Patient also is requesting a replacement diabetic shoes  Patient is type II diabetic without history of claudication, amputation, ulceration  Objective: Orientated 3 DP right 2/4 DP left 0/4 PT pulses 2/4 bilaterally  Neurological: Sensation to 10 g monofilament wire intact 4/5 bilaterally Vibratory sensation nonreactive right, reactive left Ankle reflexes reactive bilaterally  Dermatological: Surgical scars medial lateral right ankle Reactive plantar callus plantar base fifth right metatarsal The toenails are elongated, brittle, deformed, tender to palpation  Musculoskeletal: Restricted range of motion ankle right resulting in pressure base fifth right metatarsal with reactive callus Symptomatic onychomycoses 6-10 Absent left DP Loss of vibratory sensation right Mild decrease in protective sensation  Plan: Debridement toenails 6-10 mechanically and legs without a bleeding Certify for diabetic shoes for type II diabetic Other acquired deformity of foot, restricted right ankle motion Loss of vibratory sensation Absent DP left  Reappoint 3 months for nail debridement and upon certification for diabetic shoes

## 2016-01-08 NOTE — Patient Instructions (Signed)
Diabetes and Foot Care Diabetes may cause you to have problems because of poor blood supply (circulation) to your feet and legs. This may cause the skin on your feet to become thinner, break easier, and heal more slowly. Your skin may become dry, and the skin may peel and crack. You may also have nerve damage in your legs and feet causing decreased feeling in them. You may not notice minor injuries to your feet that could lead to infections or more serious problems. Taking care of your feet is one of the most important things you can do for yourself.  HOME CARE INSTRUCTIONS  Wear shoes at all times, even in the house. Do not go barefoot. Bare feet are easily injured.  Check your feet daily for blisters, cuts, and redness. If you cannot see the bottom of your feet, use a mirror or ask someone for help.  Wash your feet with warm water (do not use hot water) and mild soap. Then pat your feet and the areas between your toes until they are completely dry. Do not soak your feet as this can dry your skin.  Apply a moisturizing lotion or petroleum jelly (that does not contain alcohol and is unscented) to the skin on your feet and to dry, brittle toenails. Do not apply lotion between your toes.  Trim your toenails straight across. Do not dig under them or around the cuticle. File the edges of your nails with an emery board or nail file.  Do not cut corns or calluses or try to remove them with medicine.  Wear clean socks or stockings every day. Make sure they are not too tight. Do not wear knee-high stockings since they may decrease blood flow to your legs.  Wear shoes that fit properly and have enough cushioning. To break in new shoes, wear them for just a few hours a day. This prevents you from injuring your feet. Always look in your shoes before you put them on to be sure there are no objects inside.  Do not cross your legs. This may decrease the blood flow to your feet.  If you find a minor scrape,  cut, or break in the skin on your feet, keep it and the skin around it clean and dry. These areas may be cleansed with mild soap and water. Do not cleanse the area with peroxide, alcohol, or iodine.  When you remove an adhesive bandage, be sure not to damage the skin around it.  If you have a wound, look at it several times a day to make sure it is healing.  Do not use heating pads or hot water bottles. They may burn your skin. If you have lost feeling in your feet or legs, you may not know it is happening until it is too late.  Make sure your health care provider performs a complete foot exam at least annually or more often if you have foot problems. Report any cuts, sores, or bruises to your health care provider immediately. SEEK MEDICAL CARE IF:   You have an injury that is not healing.  You have cuts or breaks in the skin.  You have an ingrown nail.  You notice redness on your legs or feet.  You feel burning or tingling in your legs or feet.  You have pain or cramps in your legs and feet.  Your legs or feet are numb.  Your feet always feel cold. SEEK IMMEDIATE MEDICAL CARE IF:   There is increasing redness,   swelling, or pain in or around a wound.  There is a red line that goes up your leg.  Pus is coming from a wound.  You develop a fever or as directed by your health care provider.  You notice a bad smell coming from an ulcer or wound.   This information is not intended to replace advice given to you by your health care provider. Make sure you discuss any questions you have with your health care provider.   Document Released: 11/13/2000 Document Revised: 07/19/2013 Document Reviewed: 04/25/2013 Elsevier Interactive Patient Education 2016 Elsevier Inc[employer]  

## 2016-01-09 ENCOUNTER — Telehealth: Payer: Self-pay

## 2016-01-09 NOTE — Telephone Encounter (Signed)
Phone call from pt.  Reported seeing a suture that is exposed from right neck incision.  Reported it is hanging out approx. 1,5 ".  Also reported a rash of "little bumps around the neck incision, and on chest.  Stated he is not taking any antibiotic.  Reported that therer is a lit re-yellow drainage form right neck incision.  Reported the skin is somewhat tender, where the tape has been.  Denied any fever or chills.  Has appt. With Dr. Bridgett Larsson tomorrow.  Advised to continue to keep incisional area clean and dry.  Also advised if no active drainage, to leave open to air, while resting.  Advised to keep covered with gauze and change prn, if actively oozing.  Also advised to avoid taping skin that is tender or has rash.  Verb. Understanding.  Encouraged to keep appt. tomorrow.  Verb. Understanding.

## 2016-01-10 ENCOUNTER — Encounter: Payer: Self-pay | Admitting: Vascular Surgery

## 2016-01-10 ENCOUNTER — Ambulatory Visit (INDEPENDENT_AMBULATORY_CARE_PROVIDER_SITE_OTHER): Payer: Medicare Other | Admitting: Vascular Surgery

## 2016-01-10 VITALS — BP 164/77 | HR 60 | Temp 97.7°F | Ht 74.0 in | Wt 228.1 lb

## 2016-01-10 DIAGNOSIS — I739 Peripheral vascular disease, unspecified: Principal | ICD-10-CM

## 2016-01-10 DIAGNOSIS — L0211 Cutaneous abscess of neck: Secondary | ICD-10-CM

## 2016-01-10 DIAGNOSIS — I779 Disorder of arteries and arterioles, unspecified: Secondary | ICD-10-CM

## 2016-01-10 NOTE — Progress Notes (Signed)
    Postoperative Visit   History of Present Illness  Thomas Marshall is a 75 y.o. male who presents for postoperative follow-up for: R CEA (Date: 12/10/15).  The patient's neck incision is draining with obvious suture evident in drainage.  No fever or chills.  For VQI Use Only  PRE-ADM LIVING: Home  AMB STATUS: Ambulatory  Social History   Social History  . Marital Status: Married    Spouse Name: N/A  . Number of Children: N/A  . Years of Education: N/A   Occupational History  . Not on file.   Social History Main Topics  . Smoking status: Former Smoker -- 1.00 packs/day for 13 years    Quit date: 11/30/1970  . Smokeless tobacco: Never Used  . Alcohol Use: No  . Drug Use: No  . Sexual Activity: Yes   Other Topics Concern  . Not on file   Social History Narrative    Current Outpatient Prescriptions on File Prior to Visit  Medication Sig Dispense Refill  . aspirin 81 MG tablet Take 81 mg by mouth daily.    [name] atorvastatin (LIPITOR) 40 MG tablet Take 40 mg by mouth daily.    . cholecalciferol (VITAMIN D) 1000 UNITS tablet Take 1,000 Units by mouth daily.    . clindamycin (CLEOCIN) 150 MG capsule Take 1 capsule (150 mg total) by mouth 3 (three) times daily. 30 capsule 1  . cloNIDine (CATAPRES) 0.1 MG tablet TAKE ONE TABLET BY MOUTH TWICE DAILY 180 tablet 2  . metFORMIN (GLUCOPHAGE) 1000 MG tablet Take 1 tablet (1,000 mg total) by mouth daily after supper. 30 tablet 1  . nitroGLYCERIN (NITROSTAT) 0.4 MG SL tablet Place 1 tablet (0.4 mg total) under the tongue every 5 (five) minutes as needed for chest pain. 25 tablet 3  . omeprazole (PRILOSEC) 20 MG capsule Take 20 mg by mouth daily.    [name] oxyCODONE-acetaminophen (PERCOCET/ROXICET) 5-325 MG tablet Take 1-2 tablets by mouth every 6 (six) hours as needed for moderate pain. 15 tablet 0  . spironolactone (ALDACTONE) 25 MG tablet TAKE ONE TABLET BY MOUTH ONCE DAILY 90 tablet 1  . vitamin C (ASCORBIC ACID) 500 MG tablet Take 500 mg  by mouth daily.    . vitamin E 400 UNIT capsule Take 400 Units by mouth daily.    [name] warfarin (COUMADIN) 10 MG tablet Take 10 mg by mouth daily.     No current facility-administered medications on file prior to visit.    Physical Examination  Filed Vitals:   01/10/16 1041 01/10/16 1043  BP: 162/70 164/77  Pulse: 60   Temp: 97.7 F (36.5 C)     R Neck: portions of upper pole of incision opening with pus draining, obvious subcutaneous tissue evident in wound, incision completely opened, no residual pus deep, no signs of cellulitis  Medical Decision Making  Thomas Marshall is a 75 y.o. male who presents s/p R CEA.  The entirety of the superficial vicryl stitch became infected and became a suture abscess. I have completely removed the stitch, so now he should be able heal by secondary intent. Continue wet-to-dry dressing BID. Will check on wound in one week.  Thank you for allowing [place] to participate in this patient's care.  Adele Barthel, MD [facility] Office: [phone] Pager: [phone]

## 2016-01-13 DIAGNOSIS — I4891 Unspecified atrial fibrillation: Secondary | ICD-10-CM | POA: Diagnosis not present

## 2016-01-13 DIAGNOSIS — E119 Type 2 diabetes mellitus without complications: Secondary | ICD-10-CM | POA: Diagnosis not present

## 2016-01-13 DIAGNOSIS — Z7984 Long term (current) use of oral hypoglycemic drugs: Secondary | ICD-10-CM | POA: Diagnosis not present

## 2016-01-13 DIAGNOSIS — Z951 Presence of aortocoronary bypass graft: Secondary | ICD-10-CM | POA: Diagnosis not present

## 2016-01-13 DIAGNOSIS — T814XXA Infection following a procedure, initial encounter: Secondary | ICD-10-CM | POA: Diagnosis not present

## 2016-01-13 DIAGNOSIS — Z7901 Long term (current) use of anticoagulants: Secondary | ICD-10-CM | POA: Diagnosis not present

## 2016-01-15 ENCOUNTER — Encounter: Payer: Self-pay | Admitting: Vascular Surgery

## 2016-01-15 DIAGNOSIS — I4891 Unspecified atrial fibrillation: Secondary | ICD-10-CM | POA: Diagnosis not present

## 2016-01-15 DIAGNOSIS — T814XXA Infection following a procedure, initial encounter: Secondary | ICD-10-CM | POA: Diagnosis not present

## 2016-01-15 DIAGNOSIS — E119 Type 2 diabetes mellitus without complications: Secondary | ICD-10-CM | POA: Diagnosis not present

## 2016-01-15 DIAGNOSIS — Z951 Presence of aortocoronary bypass graft: Secondary | ICD-10-CM | POA: Diagnosis not present

## 2016-01-15 DIAGNOSIS — Z7984 Long term (current) use of oral hypoglycemic drugs: Secondary | ICD-10-CM | POA: Diagnosis not present

## 2016-01-15 DIAGNOSIS — Z7901 Long term (current) use of anticoagulants: Secondary | ICD-10-CM | POA: Diagnosis not present

## 2016-01-17 ENCOUNTER — Ambulatory Visit (INDEPENDENT_AMBULATORY_CARE_PROVIDER_SITE_OTHER): Payer: Medicare Other | Admitting: Family Medicine

## 2016-01-17 ENCOUNTER — Ambulatory Visit (INDEPENDENT_AMBULATORY_CARE_PROVIDER_SITE_OTHER): Payer: Medicare Other | Admitting: Vascular Surgery

## 2016-01-17 ENCOUNTER — Encounter: Payer: Self-pay | Admitting: Family Medicine

## 2016-01-17 ENCOUNTER — Telehealth: Payer: Self-pay | Admitting: Cardiovascular Disease

## 2016-01-17 ENCOUNTER — Encounter: Payer: Self-pay | Admitting: Vascular Surgery

## 2016-01-17 ENCOUNTER — Ambulatory Visit (HOSPITAL_COMMUNITY)
Admission: RE | Admit: 2016-01-17 | Discharge: 2016-01-17 | Disposition: A | Discharge status: Home / Self Care | Payer: Medicare Other | Source: Ambulatory Visit | Attending: Family Medicine | Admitting: Family Medicine

## 2016-01-17 VITALS — BP 154/56 | HR 54 | Temp 98.1°F | Wt 231.0 lb

## 2016-01-17 VITALS — BP 168/78 | HR 53 | Ht 74.0 in | Wt 231.7 lb

## 2016-01-17 DIAGNOSIS — M546 Pain in thoracic spine: Secondary | ICD-10-CM | POA: Diagnosis not present

## 2016-01-17 DIAGNOSIS — I779 Disorder of arteries and arterioles, unspecified: Secondary | ICD-10-CM

## 2016-01-17 DIAGNOSIS — R001 Bradycardia, unspecified: Secondary | ICD-10-CM | POA: Diagnosis not present

## 2016-01-17 DIAGNOSIS — I491 Atrial premature depolarization: Secondary | ICD-10-CM | POA: Diagnosis not present

## 2016-01-17 DIAGNOSIS — I6523 Occlusion and stenosis of bilateral carotid arteries: Secondary | ICD-10-CM

## 2016-01-17 DIAGNOSIS — I739 Peripheral vascular disease, unspecified: Principal | ICD-10-CM

## 2016-01-17 NOTE — Telephone Encounter (Signed)
Calling stating that he has had some pain in back on (L) shoulder blade for several days.  Does not radiate to front of chest.  Denies CP, SOB, or dizziness, lightheadedness. States he has not lifted any heavy objects in past few days.  Saw Dr. Bridgett Larsson this AM and mentioned to him and he advised for him to call his PCP.  States he has not called his PCP.  Advised for him to call her and if she felt like he needed to be seen by cardiology to call us back. He verbalizes understanding.

## 2016-01-17 NOTE — Progress Notes (Signed)
       Postoperative Visit   History of Present Illness  Thomas Marshall is a 74 y.o. (12/19/1940) male who presents for postoperative follow-up for: R CEA (Date: 12/10/15). The patient's neck incision has stopped draining.  No fever or chills.  Pt c/o L shoulder pain.   For VQI Use Only  PRE-ADM LIVING: Home  AMB STATUS: Ambulatory  Current Outpatient Prescriptions  Medication Sig Dispense Refill  . aspirin 81 MG tablet Take 81 mg by mouth daily.    [name] atorvastatin (LIPITOR) 40 MG tablet Take 40 mg by mouth daily.    . cholecalciferol (VITAMIN D) 1000 UNITS tablet Take 1,000 Units by mouth daily.    . cloNIDine (CATAPRES) 0.1 MG tablet TAKE ONE TABLET BY MOUTH TWICE DAILY 180 tablet 2  . metFORMIN (GLUCOPHAGE) 1000 MG tablet Take 1 tablet (1,000 mg total) by mouth daily after supper. 30 tablet 1  . nitroGLYCERIN (NITROSTAT) 0.4 MG SL tablet Place 1 tablet (0.4 mg total) under the tongue every 5 (five) minutes as needed for chest pain. 25 tablet 3  . omeprazole (PRILOSEC) 20 MG capsule Take 20 mg by mouth daily.    [name] spironolactone (ALDACTONE) 25 MG tablet TAKE ONE TABLET BY MOUTH ONCE DAILY 90 tablet 1  . vitamin C (ASCORBIC ACID) 500 MG tablet Take 500 mg by mouth daily.    . vitamin E 400 UNIT capsule Take 400 Units by mouth daily.    [name] warfarin (COUMADIN) 10 MG tablet Take 10 mg by mouth daily.    . clindamycin (CLEOCIN) 150 MG capsule Take 1 capsule (150 mg total) by mouth 3 (three) times daily. (Patient not taking: Reported on 01/17/2016) 30 capsule 1  . oxyCODONE-acetaminophen (PERCOCET/ROXICET) 5-325 MG tablet Take 1-2 tablets by mouth every 6 (six) hours as needed for moderate pain. (Patient not taking: Reported on 01/17/2016) 15 tablet 0   No current facility-administered medications for this visit.    Physical Examination Filed Vitals:   01/17/16 0945 01/17/16 0950  BP: 174/82 168/78  Pulse: 53   Height: 6\' 2"  (1.88 m)   Weight: 231 lb 11.2 oz (105.098 kg)   SpO2: 99%      R Neck: entire incision clean, good granulation tissue throughout, lower pole of incision nearing level of skin   Medical Decision Making  Thomas Marshall is a 75 y.o. (June 15, 1941) male who presents s/p R CEA complicated by stitch abscess   Continue wet-to-dry dressing BID.  Will check on wound in 2 week.  The patient's wife is concerned with possible angina due to the patient's persistent L shoulder pain.  I referred her back to his PCP for screening EKG, as I don't have one in the office.  Thank you for allowing [place] to participate in this patient's care.  Adele Barthel, MD, FACS [facility] Office: [phone] Pager: [phone]  01/17/2016, 10:07 AM

## 2016-01-17 NOTE — Patient Instructions (Signed)
You probably have a pulled muscle in your back. Your EKG was normal.  You can use heat to the area as needed.   If your symptoms are not improving in 1-2 weeks, please let us know.  If you start to have severe chest pain, shortness of breath, or dizziness, please seek medical care immediately.  Take care,  Dr Jerline Pain

## 2016-01-17 NOTE — Telephone Encounter (Signed)
Having some back pain and pain around his shoulder blade .Wants to come in for an EKG ?

## 2016-01-17 NOTE — Progress Notes (Signed)
    Subjective:  Thomas Marshall is a 75 y.o. male who presents to the [facility] today with a chief complaint of back Marshall.   HPI:  Back Marshall Started 2-3 days ago. Marshall located just below his left shoulder blade. No recent trauma or strenuous exercise. No chest Marshall. No shortness of breath. No nausea or vomiting. No dizziness. Has not tried any medications. Marshall is worse with moving his left arm. Patient is not exacerbated by exertion. Has not tried any medications. No fevers or chills. No cough.   ROS: Per HPI   Objective:  Physical Exam: BP 180/72 mmHg  Pulse 59  Temp(Src) 98.1 F (36.7 C) (Oral)  Wt 231 lb (104.781 kg)  Gen: NAD, resting comfortably CV: RRR with no murmurs appreciated. Radial pulses 2+ bilaterally Pulm: NWOB, CTAB with no crackles, wheezes, or rhonchi GI: Normal bowel sounds present. Soft, Nontender, Nondistended. MSK:  - Back: Tender to palpation over left thoracic paraspinal muscles. Marshall reproduced with active flexion and abduction of left shoulder. Skin: warm, dry, no rashes Neuro: grossly normal, moves all extremities Psych: Normal affect and thought content  EKG: Sinus bradycardia. No ischemic changes.  Assessment/Plan:  Back Marshall A: Likely Muscular strain given reproducibility on exam. Doubt cardiac etiology given normal EKG, lack of chest Marshall, and prolonged course, though must keep on differential given patient's history of cardiac disease. Doubt aortic dissection given equal BP bilaterally and symmetric radial pulses. No signs of shingles or other skin infections. No fevers or lung findings to suggest pneumonia or other pulmonary process.   P:  Offered muscle relaxant, however patient declined. Instructed patient to use heating pads as needed and that topical analgesics such as Bengay or Icy-hot may relieve his Marshall. If not improving in 2-3 weeks, may consider CXR to rule out pulmonary cause. Strict return precautions reviewed.   Algis Greenhouse. Thomas Marshall, [facility] Resident PGY-2 01/17/2016 11:09 AM

## 2016-01-20 ENCOUNTER — Telehealth: Payer: Self-pay | Admitting: Cardiovascular Disease

## 2016-01-20 DIAGNOSIS — R51 Headache: Secondary | ICD-10-CM | POA: Diagnosis not present

## 2016-01-20 DIAGNOSIS — Z7984 Long term (current) use of oral hypoglycemic drugs: Secondary | ICD-10-CM | POA: Diagnosis not present

## 2016-01-20 DIAGNOSIS — E119 Type 2 diabetes mellitus without complications: Secondary | ICD-10-CM | POA: Diagnosis not present

## 2016-01-20 DIAGNOSIS — Z7901 Long term (current) use of anticoagulants: Secondary | ICD-10-CM | POA: Diagnosis not present

## 2016-01-20 DIAGNOSIS — Z951 Presence of aortocoronary bypass graft: Secondary | ICD-10-CM | POA: Diagnosis not present

## 2016-01-20 DIAGNOSIS — R7989 Other specified abnormal findings of blood chemistry: Secondary | ICD-10-CM | POA: Diagnosis not present

## 2016-01-20 DIAGNOSIS — T814XXA Infection following a procedure, initial encounter: Secondary | ICD-10-CM | POA: Diagnosis not present

## 2016-01-20 DIAGNOSIS — I10 Essential (primary) hypertension: Secondary | ICD-10-CM | POA: Diagnosis not present

## 2016-01-20 DIAGNOSIS — I4891 Unspecified atrial fibrillation: Secondary | ICD-10-CM | POA: Diagnosis not present

## 2016-01-20 DIAGNOSIS — Z7982 Long term (current) use of aspirin: Secondary | ICD-10-CM | POA: Diagnosis not present

## 2016-01-20 DIAGNOSIS — E785 Hyperlipidemia, unspecified: Secondary | ICD-10-CM | POA: Diagnosis not present

## 2016-01-20 NOTE — Telephone Encounter (Signed)
New message      Pt c/o BP issue: STAT if pt c/o blurred vision, one-sided weakness or slurred speech  1. What are your last 5 BP readings? 200/90--1hr after medication 2. Are you having any other symptoms (ex. Dizziness, headache, blurred vision, passed out)? headache 3. What is your BP issue? Pt had surgery 12-10-15 (carotid artery surgery).  The nurse comes to his house twice a week to clean the wound and repack the wound.  Pt woke up with a bad headache this am.  The nurse wanted him to go to the ER, but he decided to call the office instead.

## 2016-01-20 NOTE — Telephone Encounter (Signed)
Patient called to report that this AM when his nurse came to change his endarterectomy dressing, his BP was 210/90. He also had a "terrible headache." She rechecked his BP before she left and it was 180/?? (he could not remember diastolic). She instructed him to go to the ED at this time. Instead, [facility] called [facility]. Patient no longer reports HA, but his wife st he looks very pale. They rechecked BP while on the phone, and the reading was 210/142. He is unaware whether his monitor is accurate. Patient took all his medications about one hour prior to first BP check this AM.  Given HTN and pallor, instructed patient to go to the ED for evaluation. Patient was grateful for help.

## 2016-01-21 ENCOUNTER — Ambulatory Visit (INDEPENDENT_AMBULATORY_CARE_PROVIDER_SITE_OTHER): Payer: Medicare Other | Admitting: Family Medicine

## 2016-01-21 ENCOUNTER — Encounter: Payer: Self-pay | Admitting: Family Medicine

## 2016-01-21 VITALS — BP 133/71 | HR 52 | Temp 98.0°F | Ht 74.0 in | Wt 226.0 lb

## 2016-01-21 DIAGNOSIS — E1122 Type 2 diabetes mellitus with diabetic chronic kidney disease: Secondary | ICD-10-CM | POA: Diagnosis not present

## 2016-01-21 DIAGNOSIS — I10 Essential (primary) hypertension: Secondary | ICD-10-CM

## 2016-01-21 DIAGNOSIS — N183 Chronic kidney disease, stage 3 unspecified: Secondary | ICD-10-CM

## 2016-01-21 DIAGNOSIS — N400 Enlarged prostate without lower urinary tract symptoms: Secondary | ICD-10-CM

## 2016-01-21 DIAGNOSIS — N182 Chronic kidney disease, stage 2 (mild): Secondary | ICD-10-CM | POA: Diagnosis not present

## 2016-01-21 DIAGNOSIS — I6523 Occlusion and stenosis of bilateral carotid arteries: Secondary | ICD-10-CM

## 2016-01-21 DIAGNOSIS — E1129 Type 2 diabetes mellitus with other diabetic kidney complication: Secondary | ICD-10-CM | POA: Diagnosis not present

## 2016-01-21 MED ORDER — GLUCOSE BLOOD VI STRP: ORAL_STRIP | Status: DC

## 2016-01-21 MED ORDER — METFORMIN HCL 1000 MG PO TABS: 1000.0000 mg | ORAL_TABLET | Freq: Two times a day (BID) | ORAL | Status: DC

## 2016-01-21 NOTE — Assessment & Plan Note (Signed)
Repeat screening PSA today If elevated will refer to urology

## 2016-01-21 NOTE — Patient Instructions (Addendum)
Nice to see you again today. Continue taking your current medications. We will increase her metformin to twice daily. I sent a new prescription to your pharmacy for the new dose. We will check a PSA today. Someone will call you or send you a letter with the results of your labs when they're available. Come back and see me in 6 months to follow-up on your blood pressure and your diabetes.  Take care, Dr. Jacinto Reap

## 2016-01-21 NOTE — Progress Notes (Signed)
Subjective:   Thomas Marshall is a 75 y.o. male with a history of HTN, PVD, AAA, carotid stenosis, T2 DM, CKD3 here for  follow-up of chronic medical conditions  HTN: - Medications: clonidine 0.1mg  BID, spironolactone 25mg  daily - Compliance:good Checking BP at home: no - Denies any SOB, CP, vision changes, LE edema, medication SEs, or symptoms of hypotension - [facility] for severe headache and BP 200/90 - BP consistently elevated at ED tpp and then decreased to 0000000 systolic  123456 - Checking BG at home: no - Medications: Metformin 1000mg  daily - wonders if he should go back up to BID - Compliance: good - Diet: eats a lot of carbs, not trying to avoid anything - Exercise: none - wants to join gym soon - foot exam: 01/2016 - eye exam: 03/2015 - microalbumin: had angioedema to ACE and ARB - a1c - 7.4 in 12/2015  HCM - PNA vaccine - needs dose 2 - wants PSA rechecked  CKD3:  Reports that he didn't know he had any kidney problems Was told that his creatinine was 1.4 when he was seen in the emergency department yesterday Wonders if he should be concerned about this   Review of Systems: Per HPI.  Social History: former smoker - quit in 1972   Objective:  BP 133/71 mmHg  Pulse 52  Temp(Src) 98 F (36.7 C) (Oral)  Ht 6\' 2"  (1.88 m)  Wt 226 lb (102.513 kg)  BMI 29.00 kg/m2  Gen:  75 y.o. male in NAD HEENT: NCAT, MMM, EOMI, PERRL, anicteric sclerae Neck: Bandage in place clean dry and intact  CV: RRR, no MRG Resp: Non-labored, CTAB, no wheezes noted Abd: Soft, NTND, BS present, no guarding or organomegaly Ext: WWP, no edema MSK: no obvious deformities  Neuro: Alert and oriented, speech normal      Chemistry      Component Value Date/Time   NA 139 12/11/2015 0631   K 3.8 12/11/2015 0631   CL 105 12/11/2015 0631   CO2 25 12/11/2015 0631   BUN 14 12/11/2015 0631   CREATININE 1.15 12/11/2015 0631   CREATININE 1.42* 05/13/2015 1635        Component Value Date/Time   CALCIUM 8.2* 12/11/2015 0631   ALKPHOS 76 12/06/2015 0931   AST 26 12/06/2015 0931   ALT 24 12/06/2015 0931   BILITOT 0.6 12/06/2015 0931      Lab Results  Component Value Date   WBC 6.5 12/11/2015   HGB 10.6* 12/11/2015   HCT 31.9* 12/11/2015   MCV 93.0 12/11/2015   PLT 117* 12/11/2015    Lab Results  Component Value Date   HGBA1C 7.4* 12/06/2015   Assessment & Plan:     Thomas Marshall is a 75 y.o. male here for follow-up of HTN, T2 DM, CKD3.  Hypertension Well-controlled Multiple allergies and intolerances Continue current medications of clonidine and spironolactone Follow-up in 6 months  Type 2 diabetes mellitus with renal manifestations Fairly well-controlled A1c 7.4 recently - for elderly gentleman goal of 7.5 or less is reasonable Increase metformin back to 1000 milligrams twice a day Patient is followed by podiatry for monthly foot checks to follow callus Patient up-to-date on eye exam Microalbumin collected today Refill of blood glucose test strips Patient to keep a log of fasting glucoses and bring to next visit Follow-up in 6 months  CKD (chronic kidney disease) stage 3, GFR 30-59 ml/min Reassured patient that creatinine of 1.4 is his baseline  Likely related to long-standing hypertension and diabetes Advised against NSAID use Continue to monitor  BPH (benign prostatic hypertrophy) Repeat screening PSA today If elevated will refer to urology     Virginia Crews, MD MPH PGY-2,  [facility] 01/21/2016  12:03 PM

## 2016-01-21 NOTE — Assessment & Plan Note (Signed)
Reassured patient that creatinine of 1.4 is his baseline Likely related to long-standing hypertension and diabetes Advised against NSAID use Continue to monitor

## 2016-01-21 NOTE — Assessment & Plan Note (Addendum)
Well-controlled Multiple allergies and intolerances Continue current medications of clonidine and spironolactone Follow-up in 6 months

## 2016-01-21 NOTE — Assessment & Plan Note (Signed)
Fairly well-controlled A1c 7.4 recently - for elderly gentleman goal of 7.5 or less is reasonable Increase metformin back to 1000 milligrams twice a day Patient is followed by podiatry for monthly foot checks to follow callus Patient up-to-date on eye exam Microalbumin collected today Refill of blood glucose test strips Patient to keep a log of fasting glucoses and bring to next visit Follow-up in 6 months

## 2016-01-22 LAB — MICROALBUMIN, URINE: MICROALB UR: 2.9 mg/dL

## 2016-01-22 LAB — PSA: PSA: 1.8 ng/mL (ref <=4.00)

## 2016-01-23 ENCOUNTER — Encounter: Payer: Self-pay | Admitting: Vascular Surgery

## 2016-01-23 ENCOUNTER — Encounter: Payer: Self-pay | Admitting: Family Medicine

## 2016-01-23 DIAGNOSIS — Z7984 Long term (current) use of oral hypoglycemic drugs: Secondary | ICD-10-CM | POA: Diagnosis not present

## 2016-01-23 DIAGNOSIS — I4891 Unspecified atrial fibrillation: Secondary | ICD-10-CM | POA: Diagnosis not present

## 2016-01-23 DIAGNOSIS — E119 Type 2 diabetes mellitus without complications: Secondary | ICD-10-CM | POA: Diagnosis not present

## 2016-01-23 DIAGNOSIS — T814XXA Infection following a procedure, initial encounter: Secondary | ICD-10-CM | POA: Diagnosis not present

## 2016-01-23 DIAGNOSIS — Z951 Presence of aortocoronary bypass graft: Secondary | ICD-10-CM | POA: Diagnosis not present

## 2016-01-23 DIAGNOSIS — Z7901 Long term (current) use of anticoagulants: Secondary | ICD-10-CM | POA: Diagnosis not present

## 2016-01-24 ENCOUNTER — Ambulatory Visit (HOSPITAL_COMMUNITY)
Admission: RE | Admit: 2016-01-24 | Discharge: 2016-01-24 | Disposition: A | Discharge status: Home / Self Care | Payer: Medicare Other | Source: Ambulatory Visit | Attending: Vascular Surgery | Admitting: Vascular Surgery

## 2016-01-24 ENCOUNTER — Ambulatory Visit: Payer: 59 | Admitting: Vascular Surgery

## 2016-01-24 ENCOUNTER — Telehealth: Payer: Self-pay | Admitting: Family Medicine

## 2016-01-24 DIAGNOSIS — E1122 Type 2 diabetes mellitus with diabetic chronic kidney disease: Secondary | ICD-10-CM

## 2016-01-24 DIAGNOSIS — E785 Hyperlipidemia, unspecified: Secondary | ICD-10-CM | POA: Insufficient documentation

## 2016-01-24 DIAGNOSIS — I714 Abdominal aortic aneurysm, without rupture, unspecified: Secondary | ICD-10-CM

## 2016-01-24 DIAGNOSIS — E119 Type 2 diabetes mellitus without complications: Secondary | ICD-10-CM | POA: Diagnosis not present

## 2016-01-24 DIAGNOSIS — I10 Essential (primary) hypertension: Secondary | ICD-10-CM | POA: Insufficient documentation

## 2016-01-24 DIAGNOSIS — N182 Chronic kidney disease, stage 2 (mild): Principal | ICD-10-CM

## 2016-01-24 MED ORDER — GLUCOSE BLOOD VI STRP: ORAL_STRIP | Status: DC

## 2016-01-24 NOTE — Telephone Encounter (Signed)
Directions for test strips corrected and resent to pharmacy.  Derl Barrow, RN

## 2016-01-24 NOTE — Telephone Encounter (Signed)
Patient calls, instructions on how many times to take glucose not put on RX for test strips. Please correct and send back to pharmacy.

## 2016-01-27 DIAGNOSIS — Z951 Presence of aortocoronary bypass graft: Secondary | ICD-10-CM | POA: Diagnosis not present

## 2016-01-27 DIAGNOSIS — Z7984 Long term (current) use of oral hypoglycemic drugs: Secondary | ICD-10-CM | POA: Diagnosis not present

## 2016-01-27 DIAGNOSIS — E119 Type 2 diabetes mellitus without complications: Secondary | ICD-10-CM | POA: Diagnosis not present

## 2016-01-27 DIAGNOSIS — T814XXA Infection following a procedure, initial encounter: Secondary | ICD-10-CM | POA: Diagnosis not present

## 2016-01-27 DIAGNOSIS — Z7901 Long term (current) use of anticoagulants: Secondary | ICD-10-CM | POA: Diagnosis not present

## 2016-01-27 DIAGNOSIS — I4891 Unspecified atrial fibrillation: Secondary | ICD-10-CM | POA: Diagnosis not present

## 2016-01-28 DIAGNOSIS — L570 Actinic keratosis: Secondary | ICD-10-CM | POA: Diagnosis not present

## 2016-01-28 DIAGNOSIS — L720 Epidermal cyst: Secondary | ICD-10-CM | POA: Diagnosis not present

## 2016-01-30 ENCOUNTER — Encounter: Payer: Self-pay | Admitting: Vascular Surgery

## 2016-01-30 DIAGNOSIS — T814XXA Infection following a procedure, initial encounter: Secondary | ICD-10-CM | POA: Diagnosis not present

## 2016-01-30 DIAGNOSIS — Z7984 Long term (current) use of oral hypoglycemic drugs: Secondary | ICD-10-CM | POA: Diagnosis not present

## 2016-01-30 DIAGNOSIS — E119 Type 2 diabetes mellitus without complications: Secondary | ICD-10-CM | POA: Diagnosis not present

## 2016-01-30 DIAGNOSIS — Z951 Presence of aortocoronary bypass graft: Secondary | ICD-10-CM | POA: Diagnosis not present

## 2016-01-30 DIAGNOSIS — I4891 Unspecified atrial fibrillation: Secondary | ICD-10-CM | POA: Diagnosis not present

## 2016-01-30 DIAGNOSIS — Z7901 Long term (current) use of anticoagulants: Secondary | ICD-10-CM | POA: Diagnosis not present

## 2016-01-30 NOTE — Progress Notes (Signed)
    Postoperative Visit   History of Present Illness  Thomas Marshall is a 75 y.o. (Nov 23, 1941) male who presents for postoperative follow-up for: R CEA (Date: 12/10/15). Pt denies any pain, fever or chills.  He does have a small amount of serosang drainage from the incision.  Home wound nurse has discontinued wound care.   For VQI Use Only  PRE-ADM LIVING: Home  AMB STATUS: Ambulatory  Current Outpatient Prescriptions  Medication Sig Dispense Refill  . aspirin 81 MG tablet Take 81 mg by mouth daily.    [name] atorvastatin (LIPITOR) 40 MG tablet Take 40 mg by mouth daily.    . cholecalciferol (VITAMIN D) 1000 UNITS tablet Take 1,000 Units by mouth daily.    . cloNIDine (CATAPRES) 0.1 MG tablet TAKE ONE TABLET BY MOUTH TWICE DAILY 180 tablet 2  . glucose blood test strip Use to test blood glucose level 2 times a day. 100 each 12  . metFORMIN (GLUCOPHAGE) 1000 MG tablet Take 1 tablet (1,000 mg total) by mouth 2 (two) times daily with a meal. 180 tablet 3  . nitroGLYCERIN (NITROSTAT) 0.4 MG SL tablet Place 1 tablet (0.4 mg total) under the tongue every 5 (five) minutes as needed for chest pain. 25 tablet 3  . omeprazole (PRILOSEC) 20 MG capsule Take 20 mg by mouth daily.    [name] spironolactone (ALDACTONE) 25 MG tablet TAKE ONE TABLET BY MOUTH ONCE DAILY 90 tablet 1  . vitamin C (ASCORBIC ACID) 500 MG tablet Take 500 mg by mouth daily.    [name] warfarin (COUMADIN) 10 MG tablet Take 10 mg by mouth daily.     No current facility-administered medications for this visit.     Physical Examination  Filed Vitals:   01/31/16 0822 01/31/16 0824  BP: 148/75 148/77  Pulse: 54   Temp: 98.3 F (36.8 C)   TempSrc: Oral   Height: 6\' 2"  (1.88 m)   Weight: 226 lb 8 oz (102.74 kg)   SpO2: 98%    R Neck: healing wound, nearly epithelized, small linear skin defect  AAA Duplex (01/24/16): 3.0 cm x 3.0 cm   Medical Decision Making  Thomas Marshall is a 75 y.o. (Sep 01, 1941) male who presents s/p R CEA  complicated by stitch abscess   Incision is in the process of closing.  Pt will follow up in 9 months for B carotid duplex.  His AAA duplex can be done q2 years at this point.  Thank you for allowing [place] to participate in this patient's care.   Adele Barthel, MD, FACS [facility] Office: [phone] Pager: [phone]  01/30/2016, 10:27 PM

## 2016-01-31 ENCOUNTER — Ambulatory Visit (INDEPENDENT_AMBULATORY_CARE_PROVIDER_SITE_OTHER): Payer: Medicare Other | Admitting: *Deleted

## 2016-01-31 ENCOUNTER — Encounter: Payer: Self-pay | Admitting: Vascular Surgery

## 2016-01-31 ENCOUNTER — Telehealth: Payer: Self-pay | Admitting: Family Medicine

## 2016-01-31 ENCOUNTER — Ambulatory Visit (INDEPENDENT_AMBULATORY_CARE_PROVIDER_SITE_OTHER): Payer: Medicare Other | Admitting: Vascular Surgery

## 2016-01-31 VITALS — BP 148/77 | HR 54 | Temp 98.3°F | Ht 74.0 in | Wt 226.5 lb

## 2016-01-31 DIAGNOSIS — I4891 Unspecified atrial fibrillation: Secondary | ICD-10-CM | POA: Diagnosis not present

## 2016-01-31 DIAGNOSIS — I739 Peripheral vascular disease, unspecified: Secondary | ICD-10-CM

## 2016-01-31 DIAGNOSIS — I779 Disorder of arteries and arterioles, unspecified: Secondary | ICD-10-CM

## 2016-01-31 DIAGNOSIS — Z7901 Long term (current) use of anticoagulants: Secondary | ICD-10-CM

## 2016-01-31 DIAGNOSIS — I714 Abdominal aortic aneurysm, without rupture, unspecified: Secondary | ICD-10-CM

## 2016-01-31 LAB — POCT INR: INR: 2

## 2016-01-31 NOTE — Addendum Note (Signed)
Addended by: Dorthula Rue L on: 01/31/2016 09:51 AM   Modules accepted: Orders

## 2016-01-31 NOTE — Telephone Encounter (Signed)
Patient informed that he came come in for nurse visit for second PNA injection.

## 2016-01-31 NOTE — Telephone Encounter (Signed)
Pt reports that at last visit he was supposed to receive 2nd pneumonia shot but it was never administered. Patient would like to know if it is advised that he should still receive it. Thank you, Fonda Kinder, ASA

## 2016-02-03 DIAGNOSIS — E119 Type 2 diabetes mellitus without complications: Secondary | ICD-10-CM | POA: Diagnosis not present

## 2016-02-03 DIAGNOSIS — T814XXA Infection following a procedure, initial encounter: Secondary | ICD-10-CM | POA: Diagnosis not present

## 2016-02-03 DIAGNOSIS — Z7901 Long term (current) use of anticoagulants: Secondary | ICD-10-CM | POA: Diagnosis not present

## 2016-02-03 DIAGNOSIS — Z951 Presence of aortocoronary bypass graft: Secondary | ICD-10-CM | POA: Diagnosis not present

## 2016-02-03 DIAGNOSIS — Z7984 Long term (current) use of oral hypoglycemic drugs: Secondary | ICD-10-CM | POA: Diagnosis not present

## 2016-02-03 DIAGNOSIS — I4891 Unspecified atrial fibrillation: Secondary | ICD-10-CM | POA: Diagnosis not present

## 2016-02-05 DIAGNOSIS — E119 Type 2 diabetes mellitus without complications: Secondary | ICD-10-CM | POA: Diagnosis not present

## 2016-02-05 DIAGNOSIS — Z951 Presence of aortocoronary bypass graft: Secondary | ICD-10-CM | POA: Diagnosis not present

## 2016-02-05 DIAGNOSIS — Z7984 Long term (current) use of oral hypoglycemic drugs: Secondary | ICD-10-CM | POA: Diagnosis not present

## 2016-02-05 DIAGNOSIS — T814XXA Infection following a procedure, initial encounter: Secondary | ICD-10-CM | POA: Diagnosis not present

## 2016-02-05 DIAGNOSIS — I4891 Unspecified atrial fibrillation: Secondary | ICD-10-CM | POA: Diagnosis not present

## 2016-02-05 DIAGNOSIS — Z7901 Long term (current) use of anticoagulants: Secondary | ICD-10-CM | POA: Diagnosis not present

## 2016-02-21 ENCOUNTER — Ambulatory Visit: Payer: Medicare Other | Admitting: *Deleted

## 2016-02-21 DIAGNOSIS — E119 Type 2 diabetes mellitus without complications: Secondary | ICD-10-CM

## 2016-02-21 NOTE — Progress Notes (Signed)
Patient ID: Thomas Marshall, male   DOB: Apr 14, 1941, 75 y.o.   MRN: [id number] Patient presents to be scanned and measured for diabetic shoes and inserts.

## 2016-02-28 ENCOUNTER — Ambulatory Visit (INDEPENDENT_AMBULATORY_CARE_PROVIDER_SITE_OTHER): Payer: Medicare Other | Admitting: *Deleted

## 2016-02-28 DIAGNOSIS — Z7901 Long term (current) use of anticoagulants: Secondary | ICD-10-CM

## 2016-02-28 DIAGNOSIS — I4891 Unspecified atrial fibrillation: Secondary | ICD-10-CM

## 2016-02-28 LAB — POCT INR: INR: 3

## 2016-03-02 ENCOUNTER — Ambulatory Visit (INDEPENDENT_AMBULATORY_CARE_PROVIDER_SITE_OTHER): Payer: Medicare Other | Admitting: Physician Assistant

## 2016-03-02 ENCOUNTER — Encounter: Payer: Self-pay | Admitting: Physician Assistant

## 2016-03-02 VITALS — BP 180/80 | HR 56 | Ht 74.0 in | Wt 227.1 lb

## 2016-03-02 DIAGNOSIS — E785 Hyperlipidemia, unspecified: Secondary | ICD-10-CM

## 2016-03-02 DIAGNOSIS — R0683 Snoring: Secondary | ICD-10-CM

## 2016-03-02 DIAGNOSIS — I6523 Occlusion and stenosis of bilateral carotid arteries: Secondary | ICD-10-CM

## 2016-03-02 DIAGNOSIS — I480 Paroxysmal atrial fibrillation: Secondary | ICD-10-CM

## 2016-03-02 DIAGNOSIS — I2510 Atherosclerotic heart disease of native coronary artery without angina pectoris: Secondary | ICD-10-CM

## 2016-03-02 DIAGNOSIS — I10 Essential (primary) hypertension: Secondary | ICD-10-CM | POA: Diagnosis not present

## 2016-03-02 DIAGNOSIS — I779 Disorder of arteries and arterioles, unspecified: Secondary | ICD-10-CM

## 2016-03-02 DIAGNOSIS — I739 Peripheral vascular disease, unspecified: Secondary | ICD-10-CM

## 2016-03-02 MED ORDER — HYDRALAZINE HCL 25 MG PO TABS: 25.0000 mg | ORAL_TABLET | Freq: Three times a day (TID) | ORAL | Status: DC

## 2016-03-02 NOTE — Progress Notes (Signed)
Cardiology Office Note:    Date:  03/02/2016   ID:  Thomas Marshall, DOB [birth year], MRN [id number]  PCP:  Lavon Paganini, MD  Cardiologist:  Dr. [name]   Electrophysiologist:  [facility] Surgeon: Dr. Bridgett Larsson  Chief Complaint  Patient presents with  . Follow-up    HTN    History of Present Illness:     Thomas Marshall is a 75 y.o. male with a hx of CAD status post CABG in 2004, HTN, HL, PAF, carotid stenosis, small AAA. He was previously on amiodarone.  He is on Coumadin for anticoagulation. Last seen by Dr. Burt Knack 12/16.  He underwent R CEA in 1/17 with Dr. Bridgett Larsson.    Returns for FU on BP.  Here with his wife. He has noted significantly elevated blood pressure since his carotid endarterectomy. He's had 2 episodes of severe headaches. Blood pressure was over [id number] systolically at those times. He went to the emergency room in [facility] February 20. Labs: Hemoglobin 12.2, INR 1.95, potassium 4.7, creatinine 1.40, ALT 23.   The patient denies any chest pain, significant dyspnea, syncope, orthopnea, PND, edema. His wife notes that he has a significant history of snoring. She has also witnessed apneic episodes. He admits to daytime hypersomnolence.     Past Medical History  Diagnosis Date  . Cancer (HCC)     skin & squamous cell  . Diabetes mellitus without complication ([place])   . GERD (gastroesophageal reflux disease)   . Hyperlipidemia   . Hypertension   . Coronary artery disease   . PAF (paroxysmal atrial fibrillation) ([place])   . Dysrhythmia   . Pneumonia   . Arthritis     Past Surgical History  Procedure Laterality Date  . Exploration post operative open heart    . Urinary surgery      scar tissue  . Ankle surgery      right fused  . Knee surgery      replacement on both knees  . Back surgery      low back   . Finger surgery      skin graft on rt index  . Coronary artery bypass graft  2004    [place]  . Tonsillectomy    . Colonoscopy    . Hernia repair Right      RIH  . Cholecystectomy N/A 12/07/2013    Procedure: LAPAROSCOPIC CHOLECYSTECTOMY ;  Surgeon: [name], MD;  Location: [place];  Service: General;  Laterality: N/A;  . Joint replacement    . Prostate surgery    . Endarterectomy Right 12/10/2015    Procedure: Right Carotid ENDARTERECTOMY;  Surgeon: Conrad Vining, MD;  Location: [place];  Service: Vascular;  Laterality: Right;    Current Medications: Outpatient Prescriptions Prior to Visit  Medication Sig Dispense Refill  . aspirin 81 MG tablet Take 81 mg by mouth daily.    [name] atorvastatin (LIPITOR) 40 MG tablet Take 40 mg by mouth daily.    . cholecalciferol (VITAMIN D) 1000 UNITS tablet Take 1,000 Units by mouth daily.    . cloNIDine (CATAPRES) 0.1 MG tablet TAKE ONE TABLET BY MOUTH TWICE DAILY 180 tablet 2  . glucose blood test strip Use to test blood glucose level 2 times a day. 100 each 12  . metFORMIN (GLUCOPHAGE) 1000 MG tablet Take 1 tablet (1,000 mg total) by mouth 2 (two) times daily with a meal. 180 tablet 3  . nitroGLYCERIN (NITROSTAT) 0.4 MG SL tablet Place 1  tablet (0.4 mg total) under the tongue every 5 (five) minutes as needed for chest pain. 25 tablet 3  . omeprazole (PRILOSEC) 20 MG capsule Take 20 mg by mouth daily.    [name] spironolactone (ALDACTONE) 25 MG tablet TAKE ONE TABLET BY MOUTH ONCE DAILY 90 tablet 1  . vitamin C (ASCORBIC ACID) 500 MG tablet Take 500 mg by mouth daily.    [name] warfarin (COUMADIN) 10 MG tablet Take 10 mg by mouth daily.     No facility-administered medications prior to visit.     Allergies:   Lisinopril; Losartan; Nifedipine; Triamterene; Amoxicillin; Ampicillin; and Carvedilol   Social History   Social History  . Marital Status: Married    Spouse Name: N/A  . Number of Children: N/A  . Years of Education: N/A   Social History Main Topics  . Smoking status: Former Smoker -- 1.00 packs/day for 13 years    Quit date: 11/30/1970  . Smokeless tobacco: Never Used  . Alcohol Use: No  . Drug  Use: No  . Sexual Activity: Yes   Other Topics Concern  . None   Social History Narrative     Family History:  The patient's family history includes Cancer in his sister; Diabetes in his brother and sister; Heart disease in his brother, father, and mother; Hyperlipidemia in his brother and father; Hypertension in his brother, father, and sister; Stroke in his mother. There is no history of Early death.   ROS:   Please see the history of present illness.    Review of Systems  HENT: Positive for headaches.    All other systems reviewed and are negative.   Physical Exam:    VS:  BP 180/80 mmHg  Pulse 56  Ht 6\' 2"  (1.88 m)  Wt 227 lb 1.9 oz (103.021 kg)  BMI 29.15 kg/m2   GEN: Well nourished, well developed, in no acute distress HEENT: normal Neck: no JVD, no masses Cardiac: Normal S1/S2, RRR; early 1/6 systolic  Murmur RUSB, rubs, or gallops, no edema Respiratory:  clear to auscultation bilaterally; no wheezing, rhonchi or rales GI: soft, nontender, nondistended MS: no deformity or atrophy Skin: warm and dry Neuro: No focal deficits  Psych: Alert and oriented x 3, normal affect  Wt Readings from Last 3 Encounters:  03/02/16 227 lb 1.9 oz (103.021 kg)  01/31/16 226 lb 8 oz (102.74 kg)  01/21/16 226 lb (102.513 kg)      Studies/Labs Reviewed:     EKG:  EKG is not  ordered today.  The ekg ordered today demonstrates n/a  Recent Labs: 12/06/2015: ALT 24 12/11/2015: BUN 14; Creatinine, Ser 1.15; Hemoglobin 10.6*; Platelets 117*; Potassium 3.8; Sodium 139   Recent Lipid Panel    Component Value Date/Time   CHOL 146 05/13/2015 1635   TRIG 245* 05/13/2015 1635   HDL 34* 05/13/2015 1635   CHOLHDL 4.3 05/13/2015 1635   VLDL 49* 05/13/2015 1635   LDLCALC 63 05/13/2015 1635    Additional studies/ records that were reviewed today include:   AAA Duplex 2/17 Infrarenal AAA 3 x 3 cm  ABI 12/16 R 1.17; L1.07  Carotid US Q000111Q RICA 123456 LICA 123456  Myoview 9/14(At  [facility]) No ischemia, diaphragmatic attenuation, EF 64%   ASSESSMENT:     1. Essential hypertension   2. Snoring   3. Coronary artery disease involving native coronary artery of native heart without angina pectoris   4. Bilateral carotid artery disease ([place])   5. Hyperlipidemia  6. PAF (paroxysmal atrial fibrillation) (HCC)     PLAN:     In order of problems listed above:  1. HTN - Uncontrolled. He has multiple intolerances to medications. He had significant fatigue on higher doses of clonidine. He has a history of angioedema with ACE inhibitor. He previously had significant elevations in blood sugar on nifedipine. He does have a history of snoring. His symptoms sound consistent with sleep apnea.  -  Start hydralazine 25 mg 3 times a day  -  Arrange testing for sleep apnea  -  Limit sodium in diet  2. Snoring - His symptoms sound consistent with sleep apnea. Arrange split night sleep testing.  3. CAD - s/p CABG in 2004 and low risk Myoview in 2014.  No angina. Continue aspirin, statin.   4. Carotid stenosis - s/p R CEA. FU with [facility] as planned.  5. HL - Continue statin.   6. PAF - Maintaining sinus rhythm. Continue Coumadin. We discussed the risk of recurrent atrial fibrillation with untreated sleep apnea.   7. AAA - Followed by [facility]. HR too slow to add beta-blocker.    Medication Adjustments/Labs and Tests Ordered: Current medicines are reviewed at length with the patient today.  Concerns regarding medicines are outlined above.  Medication changes, Labs and Tests ordered today are outlined in the Patient Instructions noted below. Patient Instructions  Medication Instructions:  1. START HYDRALAZINE 25 MG 1 TABLET THREE TIMES A DAY; RX SENT   Labwork: NONE  Testing/Procedures: Your physician has recommended that you have a SPLIT NIGHT sleep study. This test records several body functions during sleep, including: brain activity, eye movement, oxygen and carbon  dioxide blood levels, heart rate and rhythm, breathing rate and rhythm, the flow of air through your mouth and nose, snoring, body muscle movements, and chest and belly movement. EXPECT A CALL FROM BETHANY COOK FROM OUR OFFICE TO SET UP SLEEP STUDY.   Follow-Up: 04/02/16 @ 10:10 WITH Natasha Burda, PAC   Any Other Special Instructions Will Be Listed Below (If Applicable).  If you need a refill on your cardiac medications before your next appointment, please call your pharmacy.   Signed, Richardson Dopp, PA-C  03/02/2016 8:49 PM    [facility] [address], [place], [place]  [address] Phone: [phone]; Fax: [phone]

## 2016-03-02 NOTE — Patient Instructions (Addendum)
Medication Instructions:  1. START HYDRALAZINE 25 MG 1 TABLET THREE TIMES A DAY; RX SENT   Labwork: NONE  Testing/Procedures: Your physician has recommended that you have a SPLIT NIGHT sleep study. This test records several body functions during sleep, including: brain activity, eye movement, oxygen and carbon dioxide blood levels, heart rate and rhythm, breathing rate and rhythm, the flow of air through your mouth and nose, snoring, body muscle movements, and chest and belly movement. EXPECT A CALL FROM BETHANY COOK FROM OUR OFFICE TO SET UP SLEEP STUDY.   Follow-Up: 04/02/16 @ 10:10 WITH SCOTT WEAVER, PAC   Any Other Special Instructions Will Be Listed Below (If Applicable).  If you need a refill on your cardiac medications before your next appointment, please call your pharmacy.

## 2016-03-04 ENCOUNTER — Encounter (HOSPITAL_BASED_OUTPATIENT_CLINIC_OR_DEPARTMENT_OTHER): Payer: Self-pay | Admitting: *Deleted

## 2016-03-04 ENCOUNTER — Emergency Department (HOSPITAL_BASED_OUTPATIENT_CLINIC_OR_DEPARTMENT_OTHER): Payer: Medicare Other

## 2016-03-04 ENCOUNTER — Emergency Department (HOSPITAL_BASED_OUTPATIENT_CLINIC_OR_DEPARTMENT_OTHER)
Admission: EM | Admit: 2016-03-04 | Discharge: 2016-03-04 | Disposition: A | Discharge status: Home / Self Care | Payer: Medicare Other | Attending: Emergency Medicine | Admitting: Emergency Medicine

## 2016-03-04 DIAGNOSIS — E785 Hyperlipidemia, unspecified: Secondary | ICD-10-CM | POA: Diagnosis not present

## 2016-03-04 DIAGNOSIS — Z880 Allergy status to penicillin: Secondary | ICD-10-CM | POA: Diagnosis not present

## 2016-03-04 DIAGNOSIS — Z87891 Personal history of nicotine dependence: Secondary | ICD-10-CM | POA: Insufficient documentation

## 2016-03-04 DIAGNOSIS — M25422 Effusion, left elbow: Secondary | ICD-10-CM | POA: Diagnosis not present

## 2016-03-04 DIAGNOSIS — S42402B Unspecified fracture of lower end of left humerus, initial encounter for open fracture: Secondary | ICD-10-CM | POA: Diagnosis not present

## 2016-03-04 DIAGNOSIS — Z79899 Other long term (current) drug therapy: Secondary | ICD-10-CM | POA: Insufficient documentation

## 2016-03-04 DIAGNOSIS — S0990XA Unspecified injury of head, initial encounter: Secondary | ICD-10-CM | POA: Diagnosis not present

## 2016-03-04 DIAGNOSIS — W01198A Fall on same level from slipping, tripping and stumbling with subsequent striking against other object, initial encounter: Secondary | ICD-10-CM | POA: Insufficient documentation

## 2016-03-04 DIAGNOSIS — Z951 Presence of aortocoronary bypass graft: Secondary | ICD-10-CM | POA: Insufficient documentation

## 2016-03-04 DIAGNOSIS — S92412A Displaced fracture of proximal phalanx of left great toe, initial encounter for closed fracture: Secondary | ICD-10-CM | POA: Insufficient documentation

## 2016-03-04 DIAGNOSIS — Y998 Other external cause status: Secondary | ICD-10-CM | POA: Diagnosis not present

## 2016-03-04 DIAGNOSIS — I10 Essential (primary) hypertension: Secondary | ICD-10-CM | POA: Insufficient documentation

## 2016-03-04 DIAGNOSIS — S99922A Unspecified injury of left foot, initial encounter: Secondary | ICD-10-CM | POA: Diagnosis present

## 2016-03-04 DIAGNOSIS — K219 Gastro-esophageal reflux disease without esophagitis: Secondary | ICD-10-CM | POA: Insufficient documentation

## 2016-03-04 DIAGNOSIS — Z7982 Long term (current) use of aspirin: Secondary | ICD-10-CM | POA: Diagnosis not present

## 2016-03-04 DIAGNOSIS — Z85828 Personal history of other malignant neoplasm of skin: Secondary | ICD-10-CM | POA: Diagnosis not present

## 2016-03-04 DIAGNOSIS — Y9289 Other specified places as the place of occurrence of the external cause: Secondary | ICD-10-CM | POA: Insufficient documentation

## 2016-03-04 DIAGNOSIS — S92912A Unspecified fracture of left toe(s), initial encounter for closed fracture: Secondary | ICD-10-CM

## 2016-03-04 DIAGNOSIS — S4992XA Unspecified injury of left shoulder and upper arm, initial encounter: Secondary | ICD-10-CM | POA: Diagnosis not present

## 2016-03-04 DIAGNOSIS — Z8701 Personal history of pneumonia (recurrent): Secondary | ICD-10-CM | POA: Diagnosis not present

## 2016-03-04 DIAGNOSIS — S90112A Contusion of left great toe without damage to nail, initial encounter: Secondary | ICD-10-CM | POA: Diagnosis not present

## 2016-03-04 DIAGNOSIS — Z7901 Long term (current) use of anticoagulants: Secondary | ICD-10-CM | POA: Diagnosis not present

## 2016-03-04 DIAGNOSIS — S52132A Displaced fracture of neck of left radius, initial encounter for closed fracture: Secondary | ICD-10-CM | POA: Diagnosis not present

## 2016-03-04 DIAGNOSIS — M7989 Other specified soft tissue disorders: Secondary | ICD-10-CM | POA: Diagnosis not present

## 2016-03-04 DIAGNOSIS — S6992XA Unspecified injury of left wrist, hand and finger(s), initial encounter: Secondary | ICD-10-CM | POA: Diagnosis not present

## 2016-03-04 DIAGNOSIS — Y9389 Activity, other specified: Secondary | ICD-10-CM | POA: Insufficient documentation

## 2016-03-04 DIAGNOSIS — S80212A Abrasion, left knee, initial encounter: Secondary | ICD-10-CM | POA: Diagnosis not present

## 2016-03-04 DIAGNOSIS — S60212A Contusion of left wrist, initial encounter: Secondary | ICD-10-CM | POA: Diagnosis not present

## 2016-03-04 DIAGNOSIS — M79622 Pain in left upper arm: Secondary | ICD-10-CM | POA: Diagnosis not present

## 2016-03-04 DIAGNOSIS — Z7984 Long term (current) use of oral hypoglycemic drugs: Secondary | ICD-10-CM | POA: Insufficient documentation

## 2016-03-04 DIAGNOSIS — S80211A Abrasion, right knee, initial encounter: Secondary | ICD-10-CM | POA: Diagnosis not present

## 2016-03-04 DIAGNOSIS — I480 Paroxysmal atrial fibrillation: Secondary | ICD-10-CM | POA: Diagnosis not present

## 2016-03-04 DIAGNOSIS — Z9889 Other specified postprocedural states: Secondary | ICD-10-CM | POA: Insufficient documentation

## 2016-03-04 DIAGNOSIS — M1990 Unspecified osteoarthritis, unspecified site: Secondary | ICD-10-CM | POA: Insufficient documentation

## 2016-03-04 DIAGNOSIS — E119 Type 2 diabetes mellitus without complications: Secondary | ICD-10-CM | POA: Diagnosis not present

## 2016-03-04 DIAGNOSIS — I2510 Atherosclerotic heart disease of native coronary artery without angina pectoris: Secondary | ICD-10-CM | POA: Diagnosis not present

## 2016-03-04 DIAGNOSIS — M25532 Pain in left wrist: Secondary | ICD-10-CM | POA: Diagnosis not present

## 2016-03-04 HISTORY — DX: Abdominal aortic aneurysm, without rupture, unspecified: I71.40

## 2016-03-04 HISTORY — DX: Abdominal aortic aneurysm, without rupture: I71.4

## 2016-03-04 LAB — PROTIME-INR
INR: 2.7 — AB (ref 0.00–1.49)
PROTHROMBIN TIME: 28.3 s — AB (ref 11.6–15.2)

## 2016-03-04 IMAGING — CR DG HUMERUS 2V *L*
4 series · 4 of 4 positions shown · non-contrast
Comparison: None.

CLINICAL DATA: Fall today.  Left proximal humeral pain.

EXAM:
LEFT HUMERUS - 2+ VIEW

[w humerus ap left * (1 of 2)]
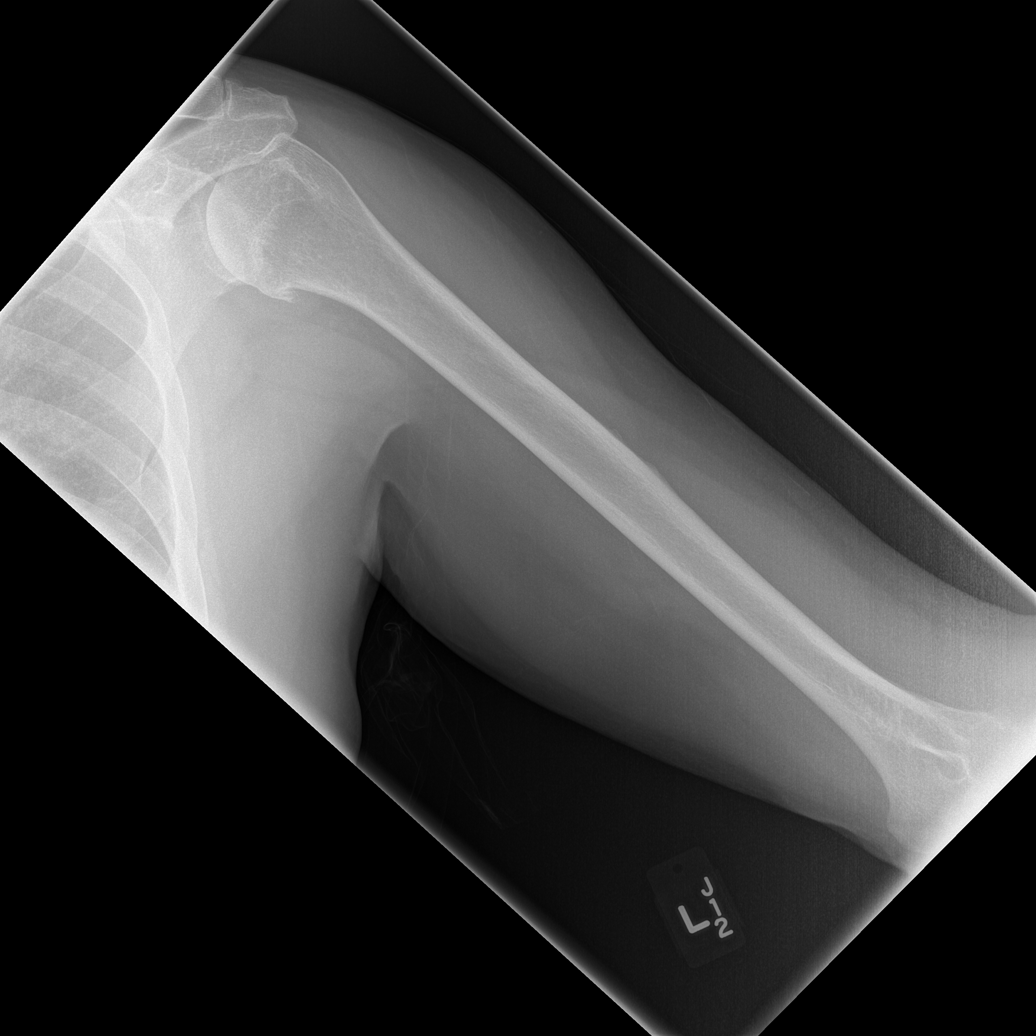

[w humerus ap left * (2 of 2)]
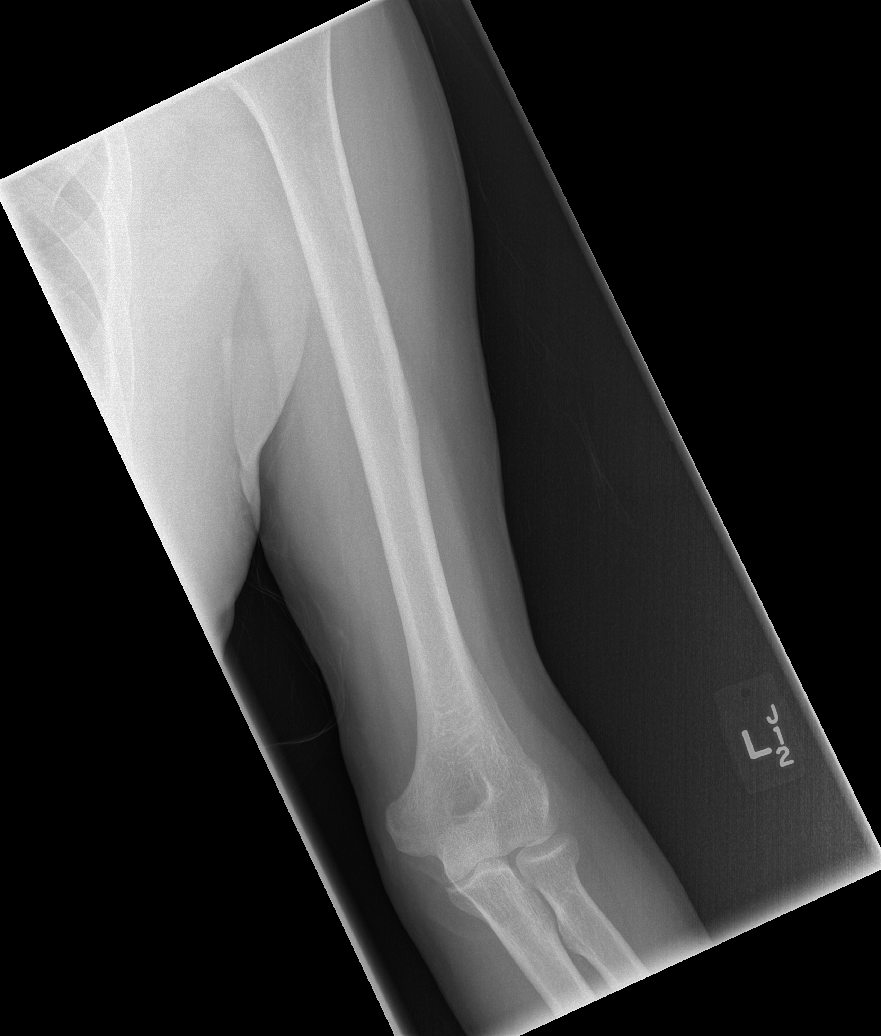

[w humerus lat left * (1 of 2)]
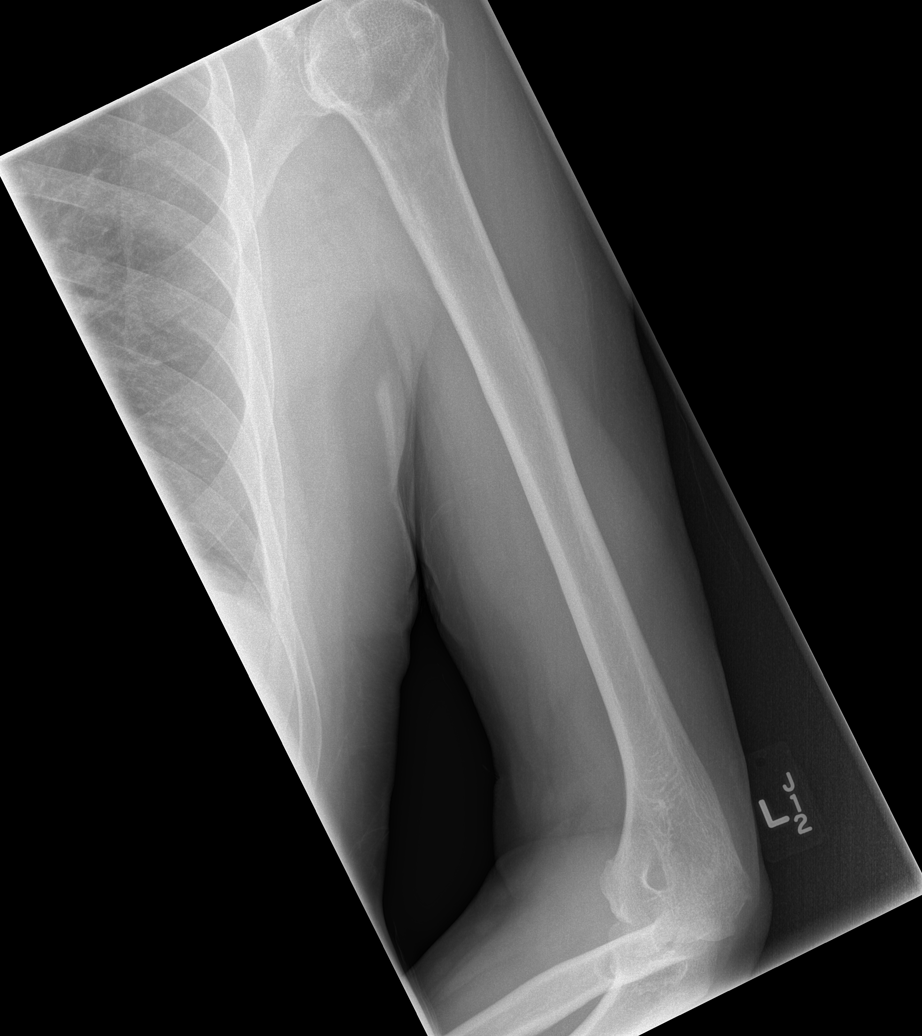

[w humerus lat left * (2 of 2)]
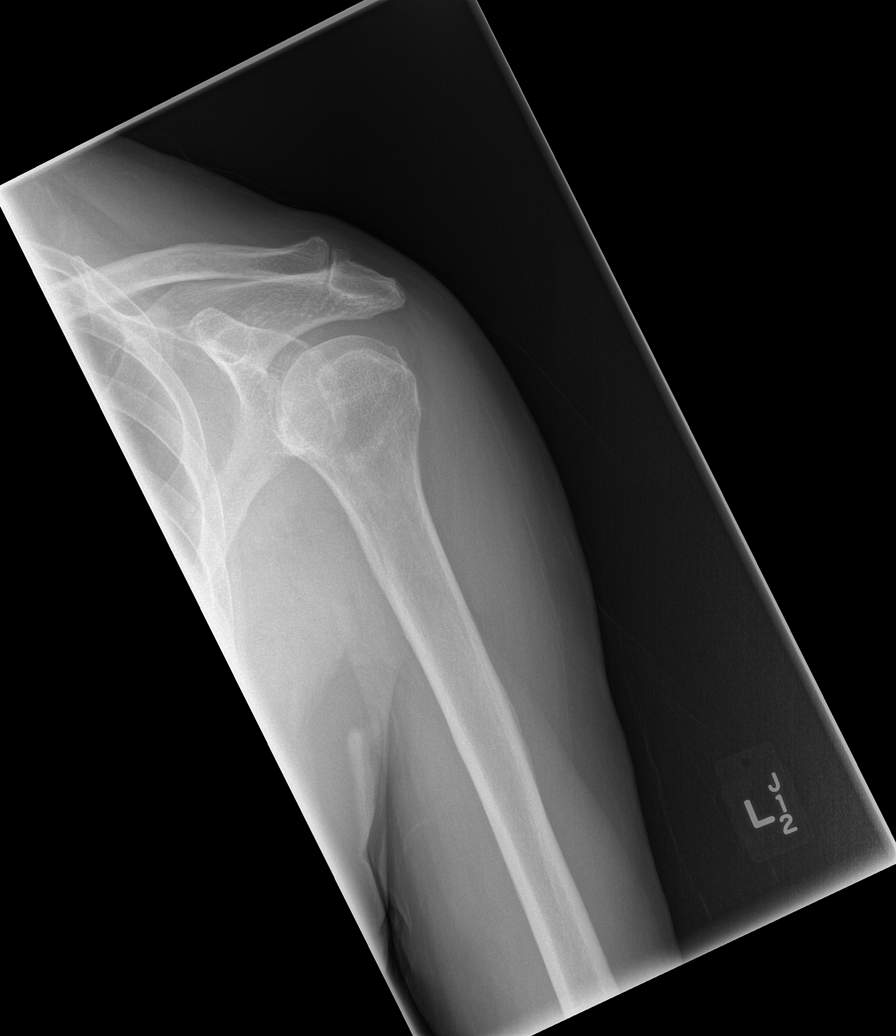

[4 of 4 positions shown; findings below may reference images not displayed]

FINDINGS: No acute bony abnormality. Specifically, no fracture, subluxation,
or dislocation. Soft tissues are intact. Mild degenerative changes
at the left shoulder.
IMPRESSION: No acute bony abnormality.

## 2016-03-04 IMAGING — CT CT HEAD W/O CM
2 series · 16 of 30 positions shown, 18 images · non-contrast
Comparison: None.

CLINICAL DATA: Fall on concrete today.  Pain under right eye.

EXAM:
CT HEAD WITHOUT CONTRAST
TECHNIQUE: Contiguous axial images were obtained from the base of the skull
through the vertex without intravenous contrast.

[Series 2: head wo · axial · 0.44mm/px · z∈[-125,+15]mm · 8 of 36 slices shown, 10 images]
[im 4/36  brain]
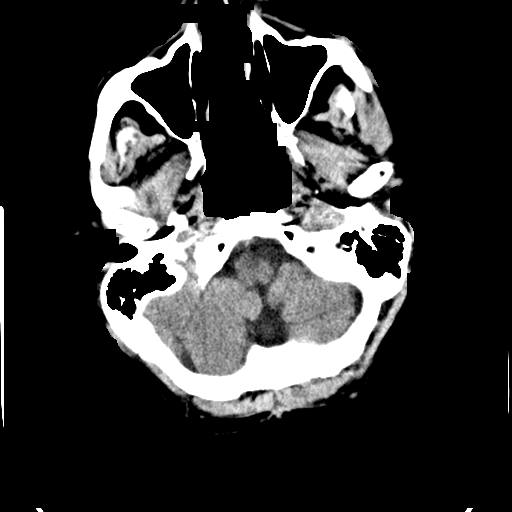
[im 4/36  bone]
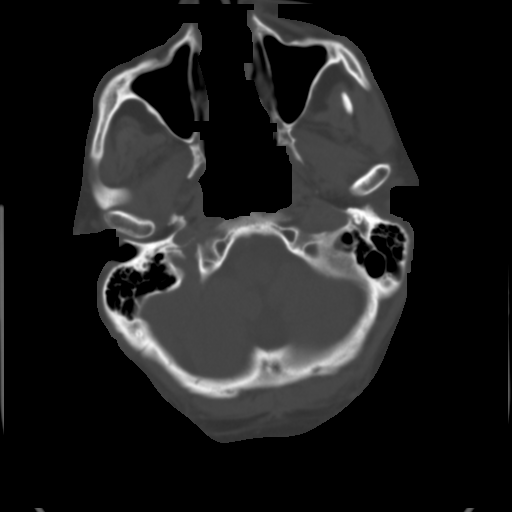
[im 8/36  brain]
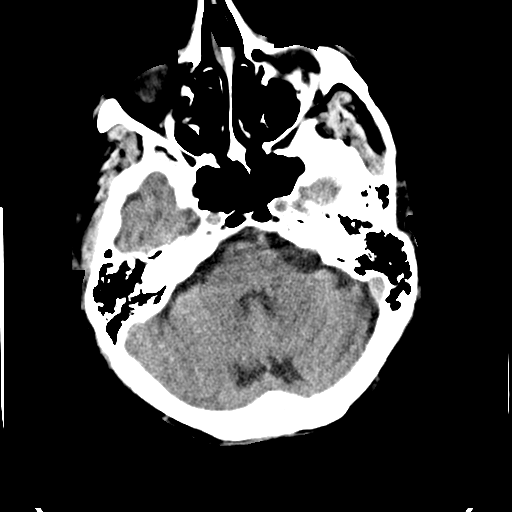
[im 12/36  brain]
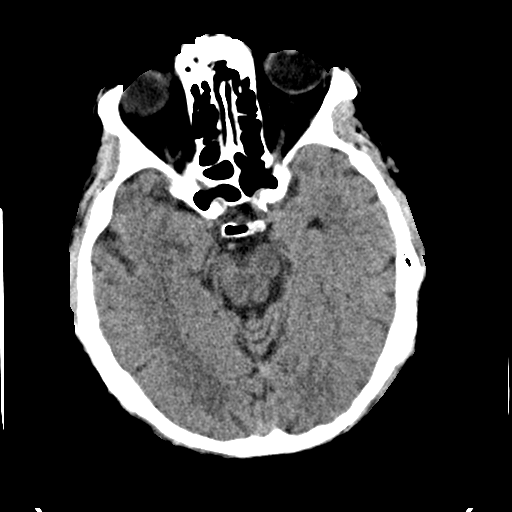
[im 16/36  brain]
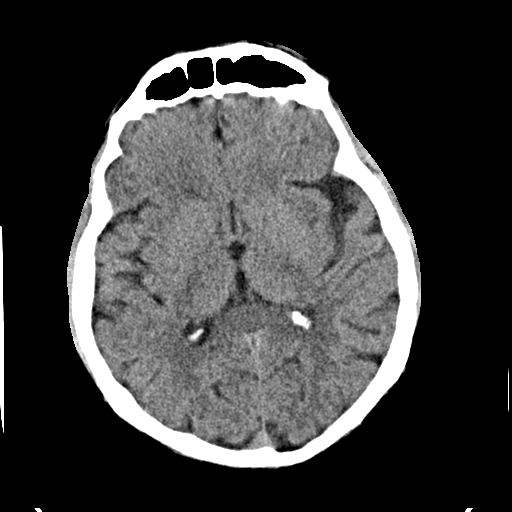
[im 20/36  brain]
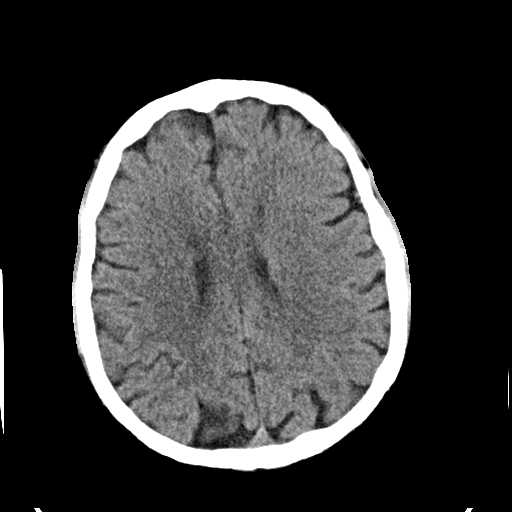
[im 20/36  bone]
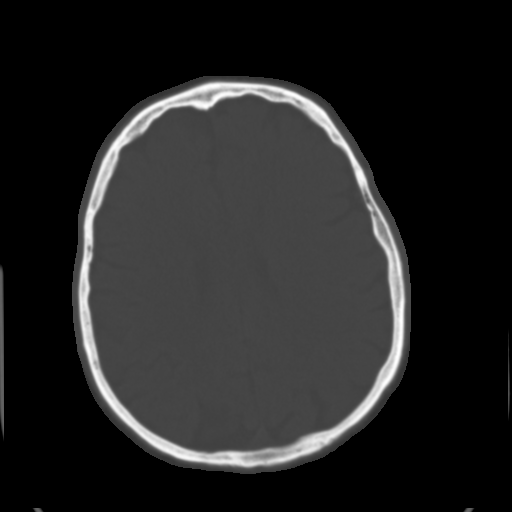
[im 24/36  brain]
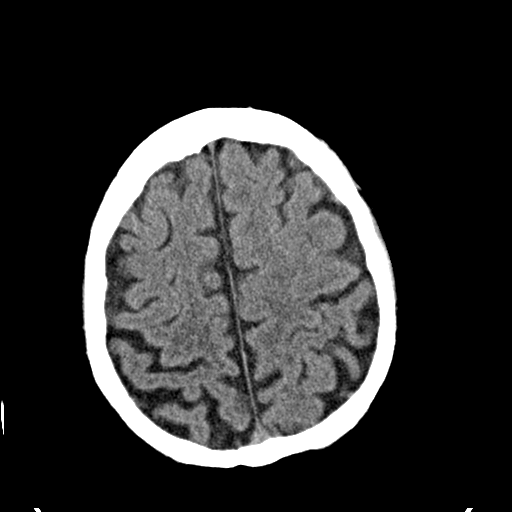
[im 28/36  brain]
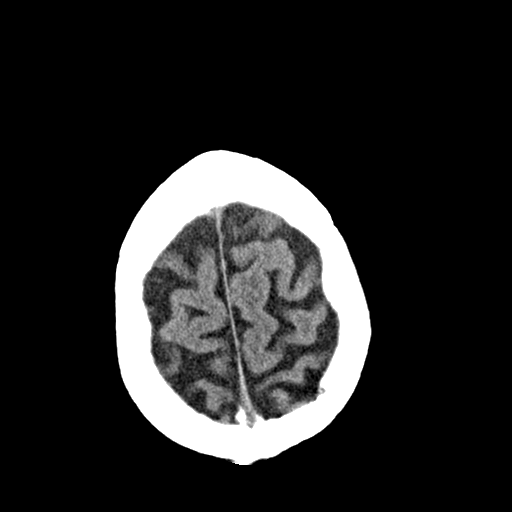
[im 32/36  brain]
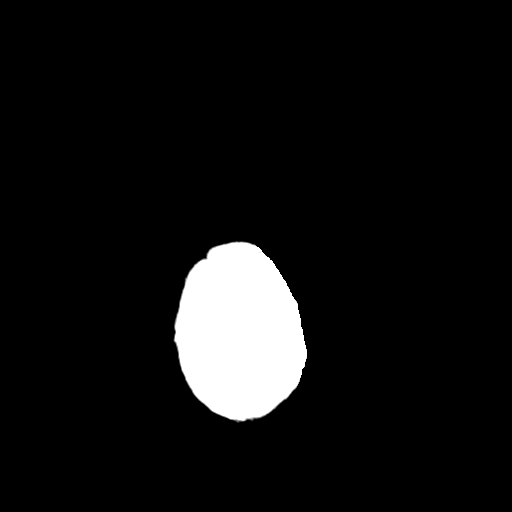

[Series 3: head bone · axial · 0.44mm/px · z∈[-124,+16]mm · 8 of 72 slices shown]
[im 8/72  bone]
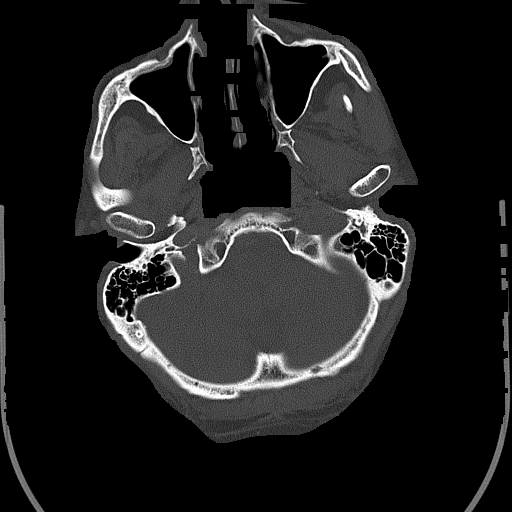
[im 15/72  bone]
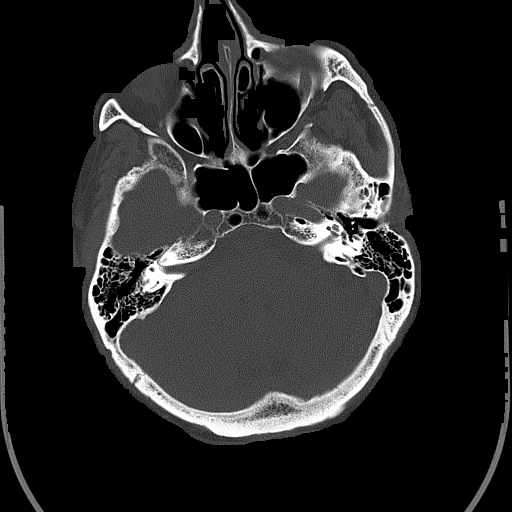
[im 23/72  bone]
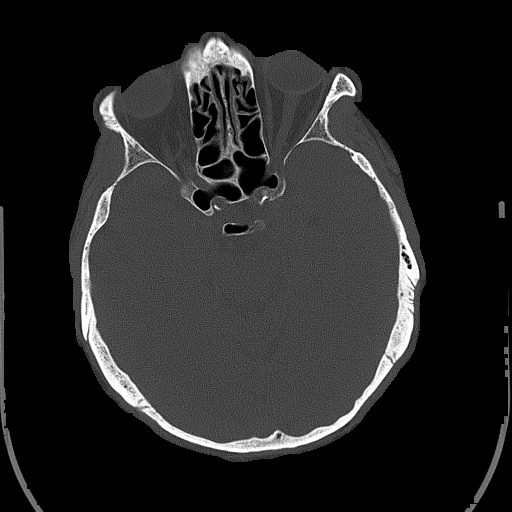
[im 30/72  bone]
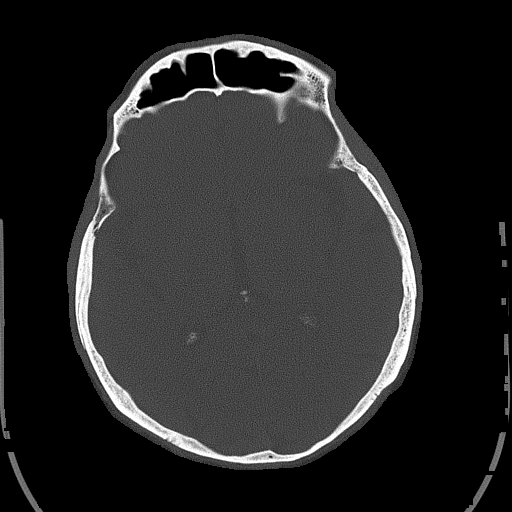
[im 42/72  bone]
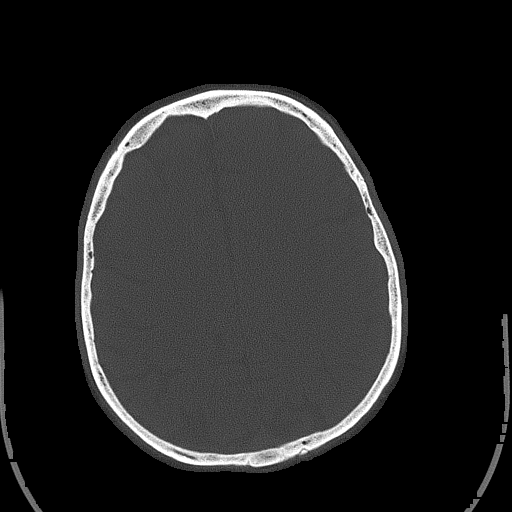
[im 49/72  bone]
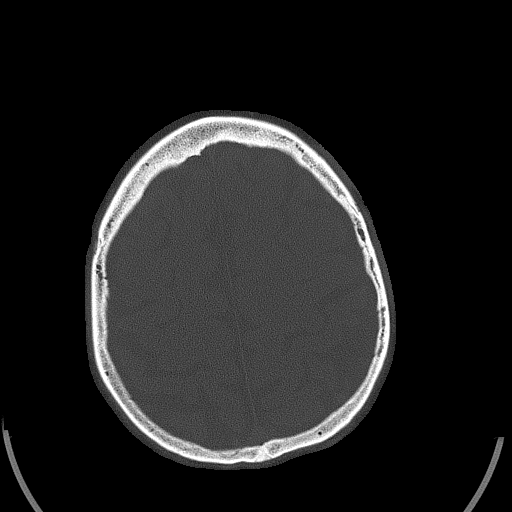
[im 57/72  bone]
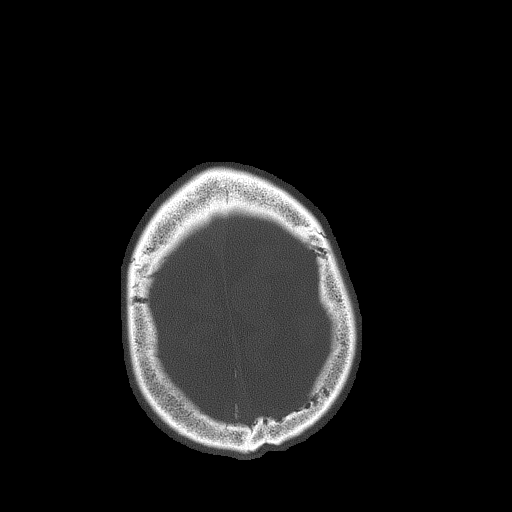
[im 64/72  bone]
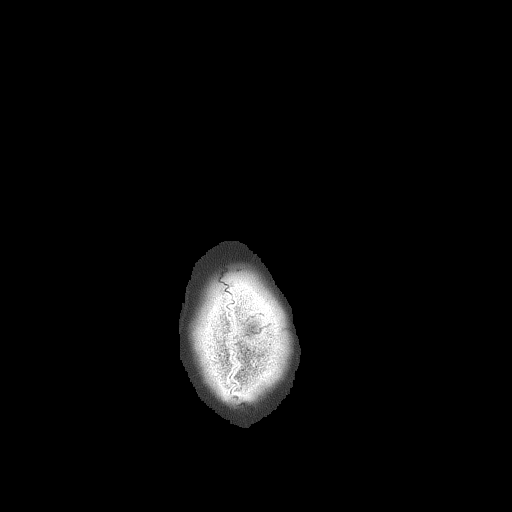

[16 of 30 positions shown; findings below may reference images not displayed]

FINDINGS: No acute intracranial abnormality. Specifically, no hemorrhage,
hydrocephalus, mass lesion, acute infarction, or significant
intracranial injury. No acute calvarial abnormality. Visualized
paranasal sinuses and mastoids clear. Orbital soft tissues
unremarkable.
IMPRESSION: No acute intracranial abnormality.

## 2016-03-04 IMAGING — CR DG WRIST COMPLETE 3+V*L*
4 series · 4 of 4 positions shown · non-contrast
Comparison: None.

CLINICAL DATA: Fall x today getting off lawn mower. Pt has Rt wrist
discomfort entirely. No old injury known.

EXAM:
LEFT WRIST - COMPLETE 3+ VIEW

[x wrist pa left]
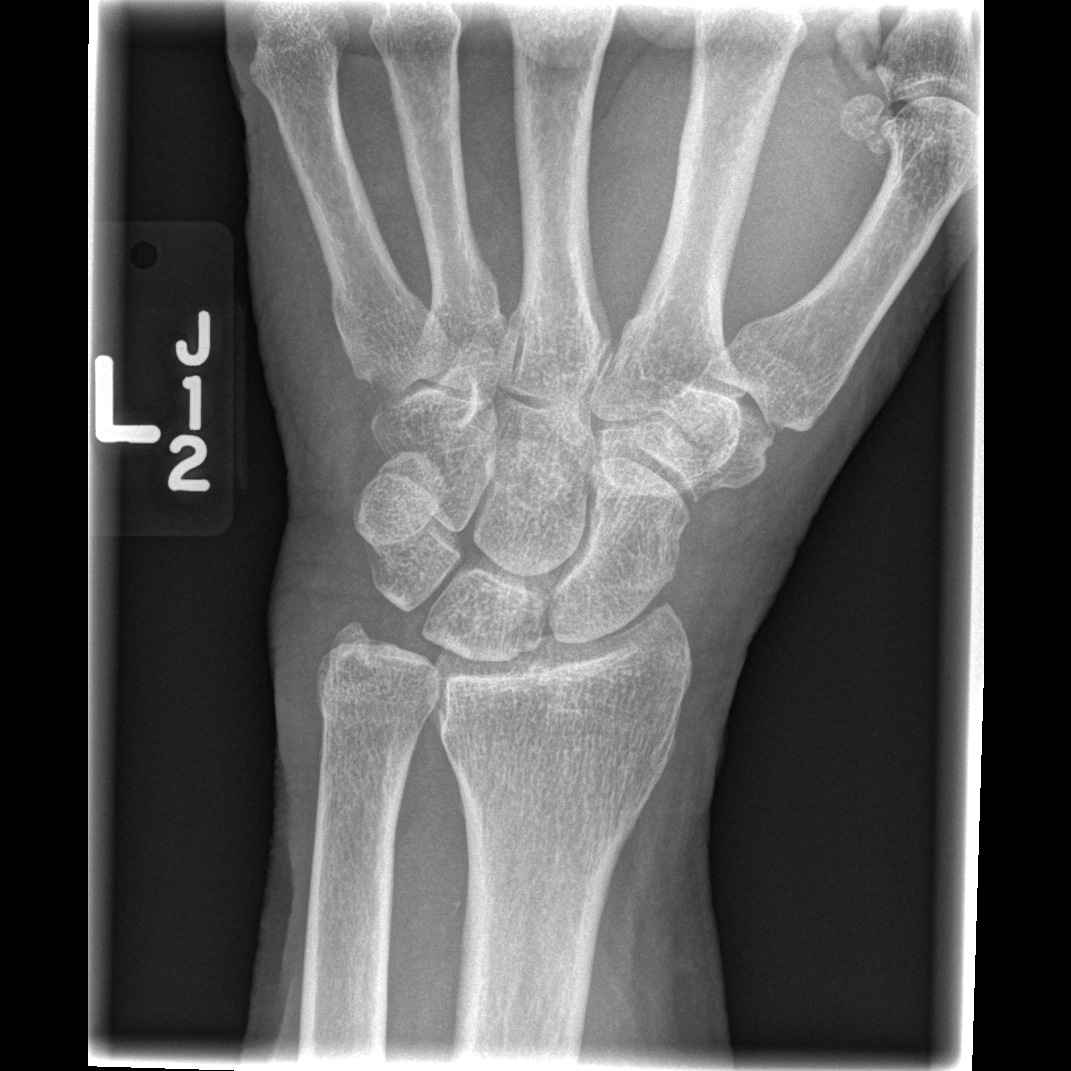

[x wrist obl left]
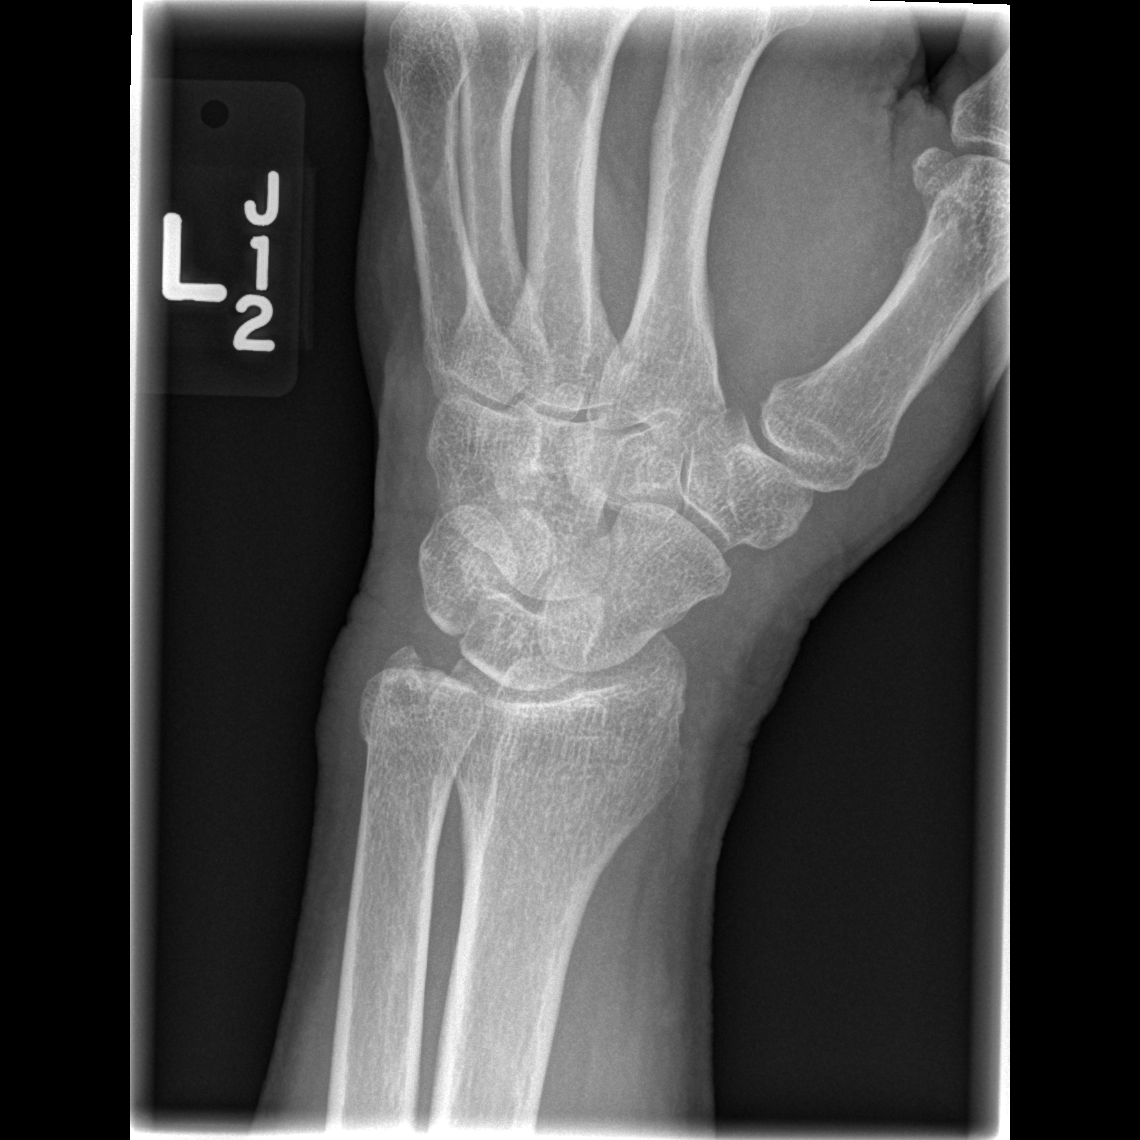

[x wrist lat left]
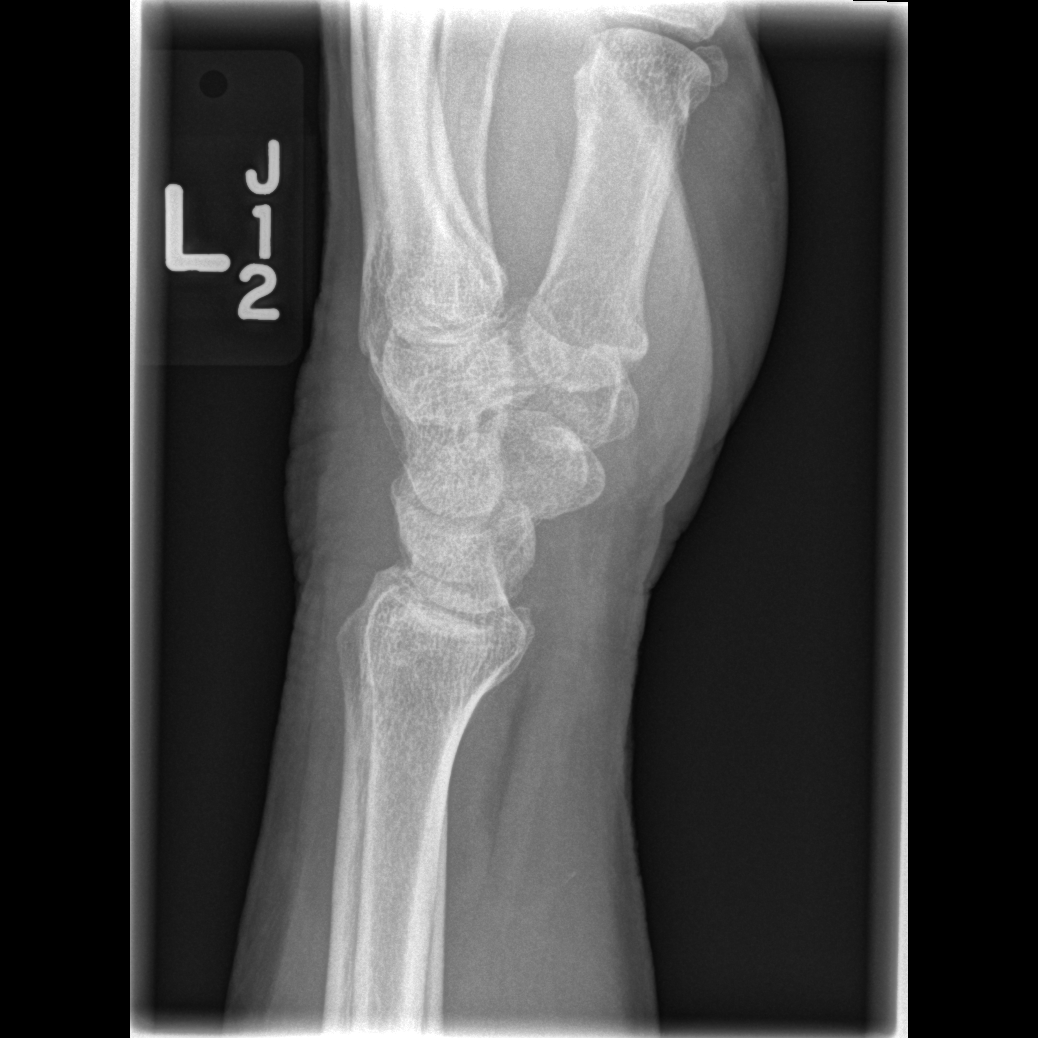

[x navicular]
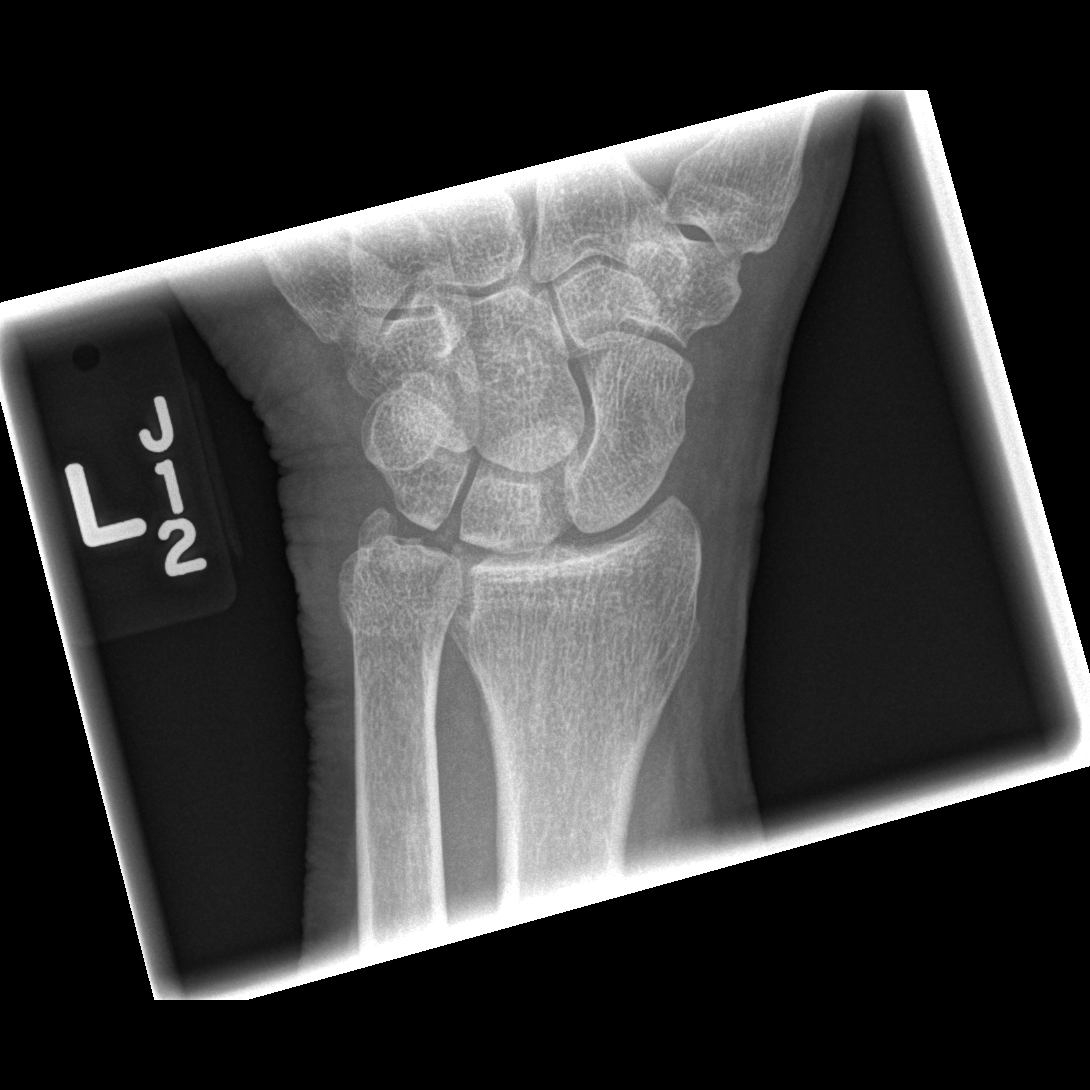

[4 of 4 positions shown; findings below may reference images not displayed]

FINDINGS: No fracture. No bone lesions. Joints normally spaced and aligned.
Mild calcification noted along the triangle fibrocartilage complex.
IMPRESSION: No fracture or dislocation.

## 2016-03-04 IMAGING — CR DG KNEE COMPLETE 4+V*R*
4 series · 4 of 4 positions shown · non-contrast
Comparison: None.

CLINICAL DATA: Fall today.  Right knee pain, abrasion

EXAM:
RIGHT KNEE - COMPLETE 4+ VIEW

[t knee ap right]
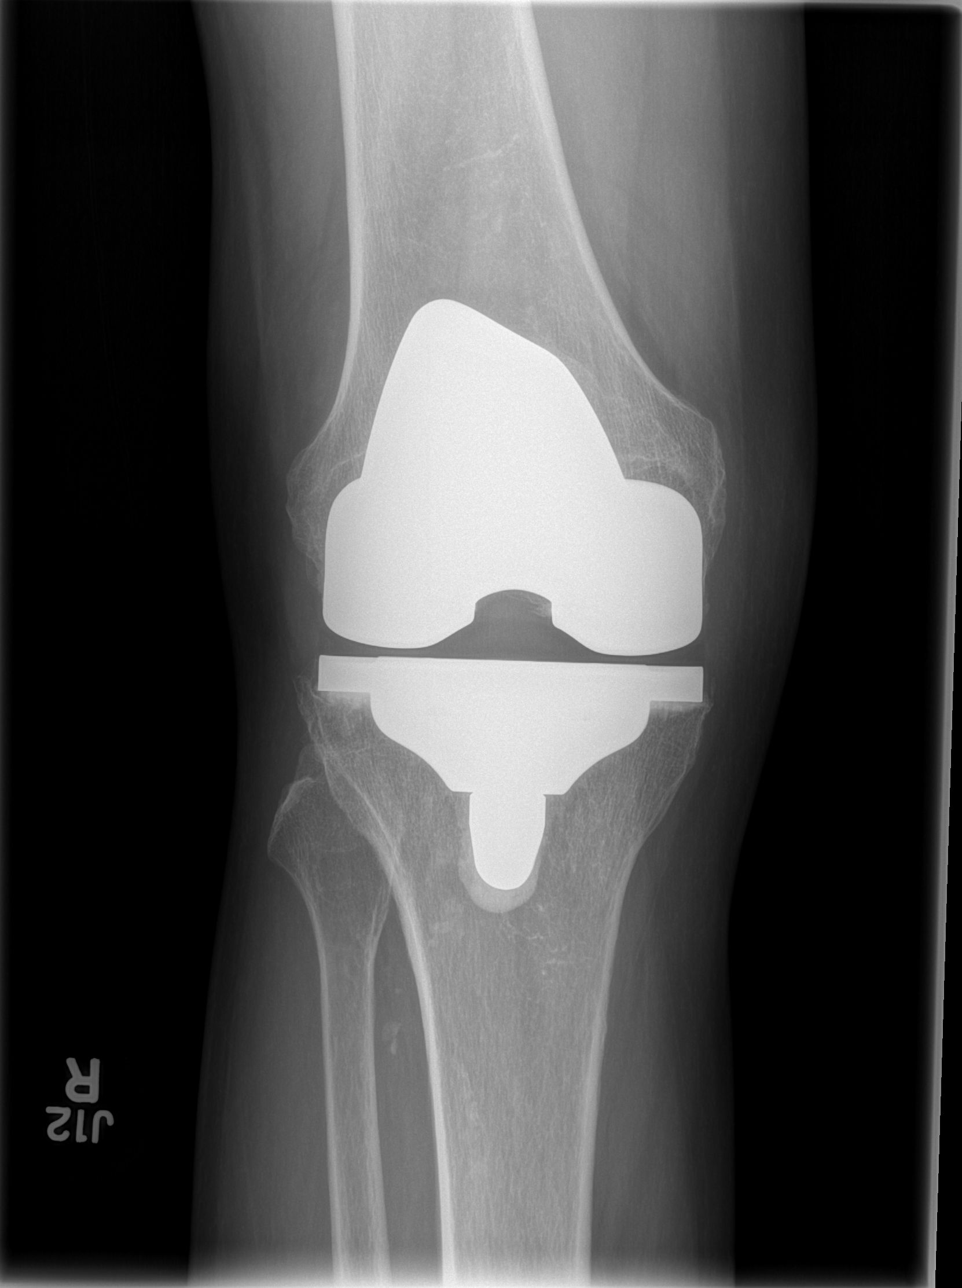

[t knee oblique right (1 of 2)]
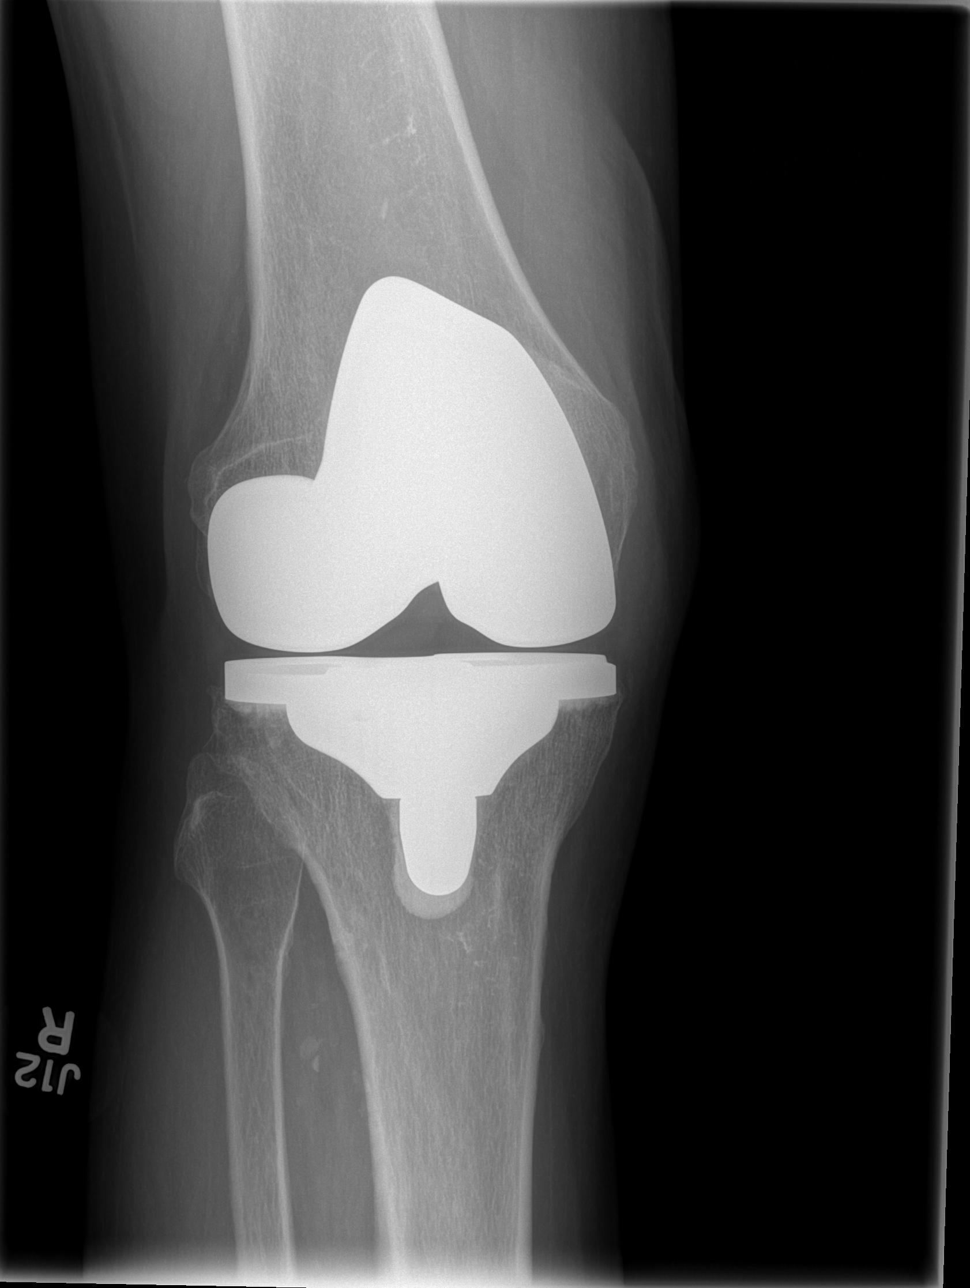

[t knee oblique right (2 of 2)]
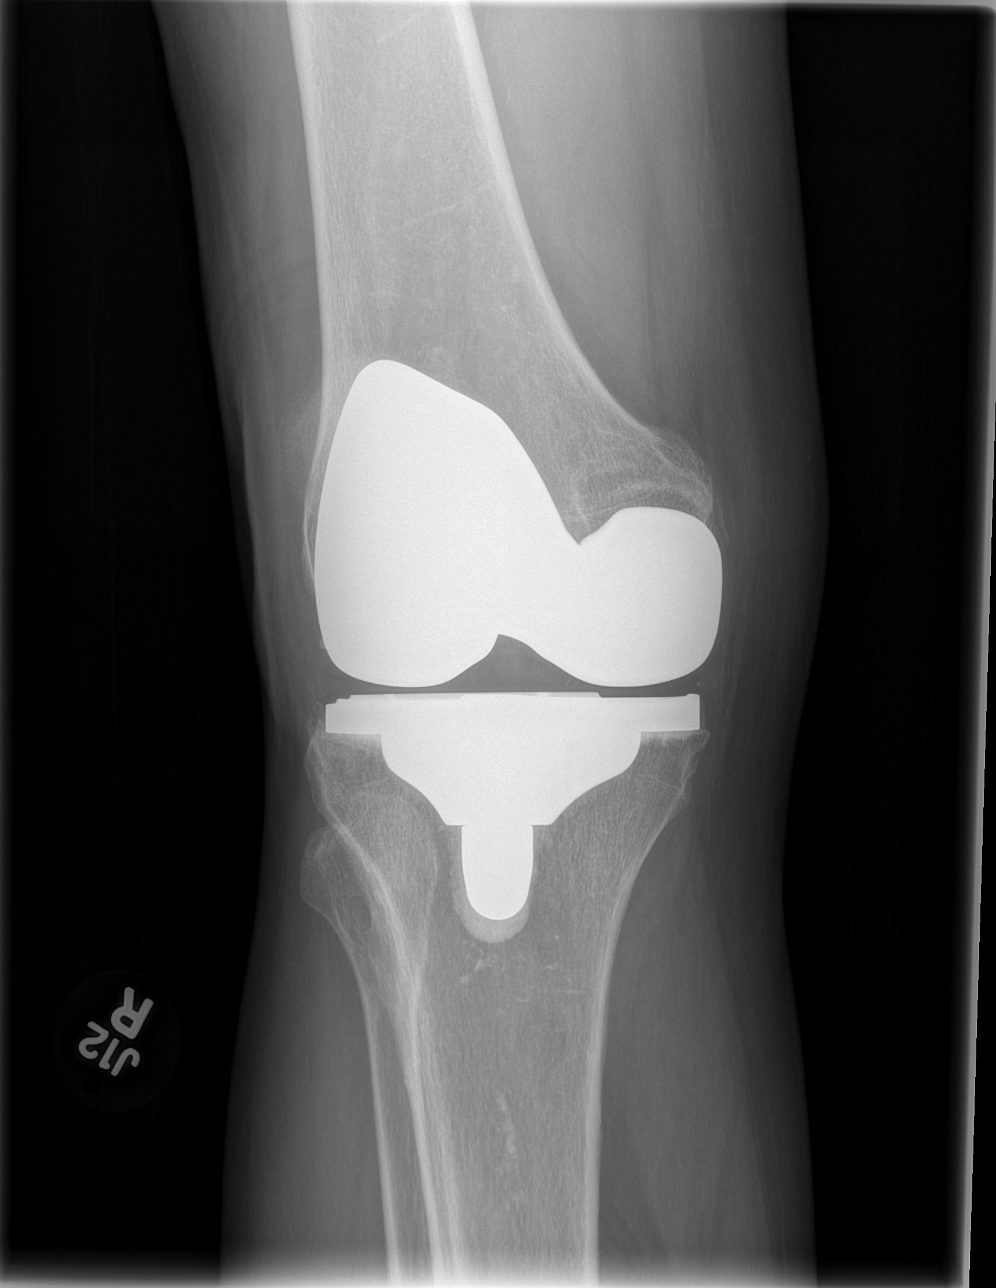

[t knee lat right]
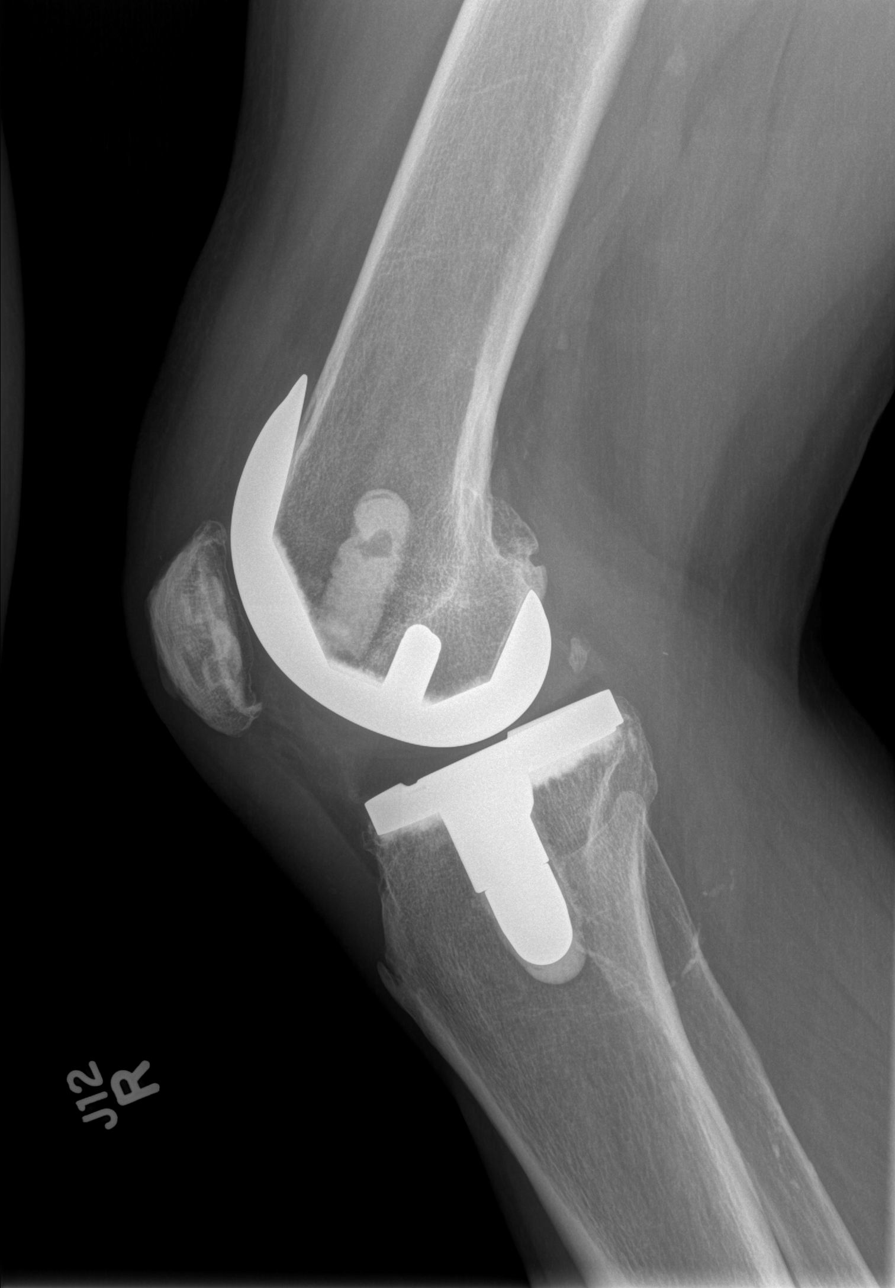

[4 of 4 positions shown; findings below may reference images not displayed]

FINDINGS: Prior right knee replacement. No hardware or bony complicating
feature. No joint effusion. No acute bony abnormality. Specifically,
no fracture, subluxation, or dislocation. Soft tissues are intact.
IMPRESSION: Right knee replacement.  No acute bony abnormality.

## 2016-03-04 IMAGING — CR DG ELBOW COMPLETE 3+V*L*
4 series · 4 of 4 positions shown · non-contrast
Comparison: Left humerus [DATE]

CLINICAL DATA: Fell getting off of lawnmower. Posterior left elbow
pain with swelling.

EXAM:
LEFT ELBOW - COMPLETE 3+ VIEW

[x elbow joint lat left]
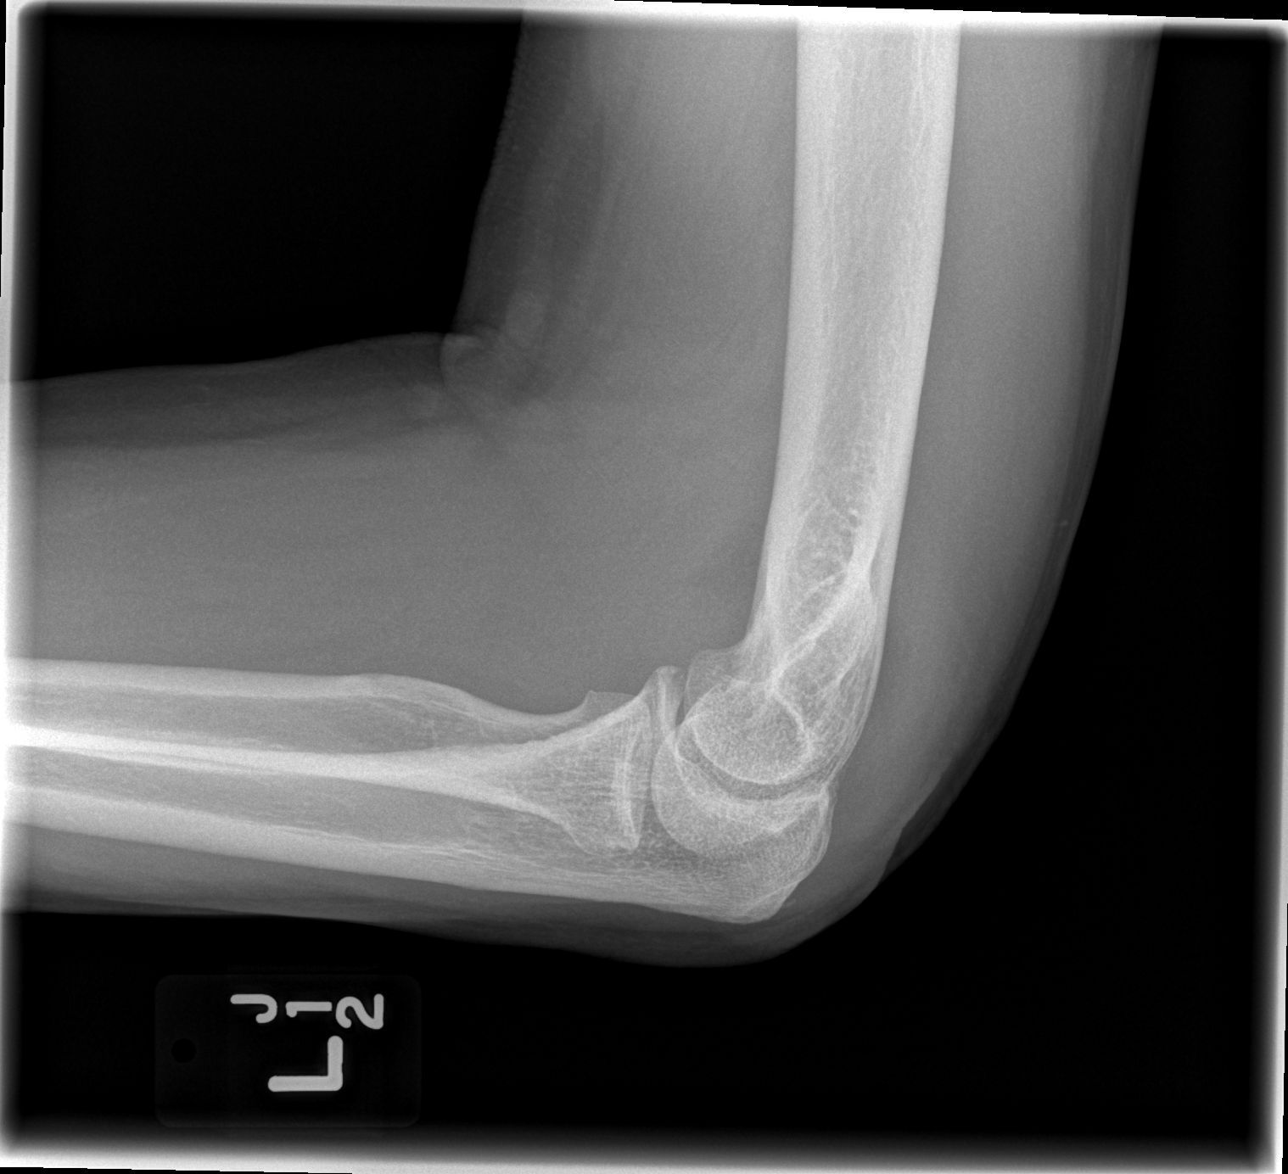

[x elbow joint ap left]
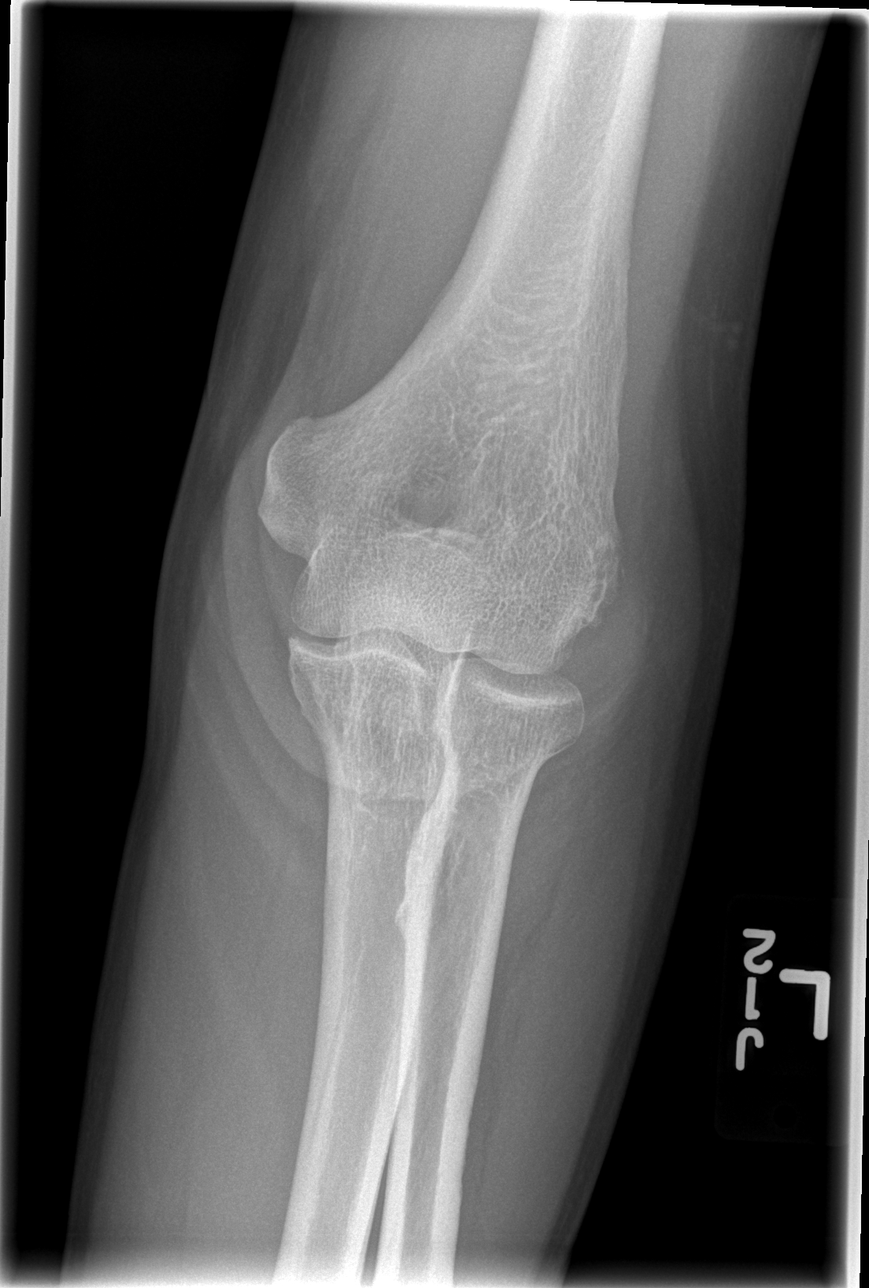

[x elbow joint obl. left (1 of 2)]
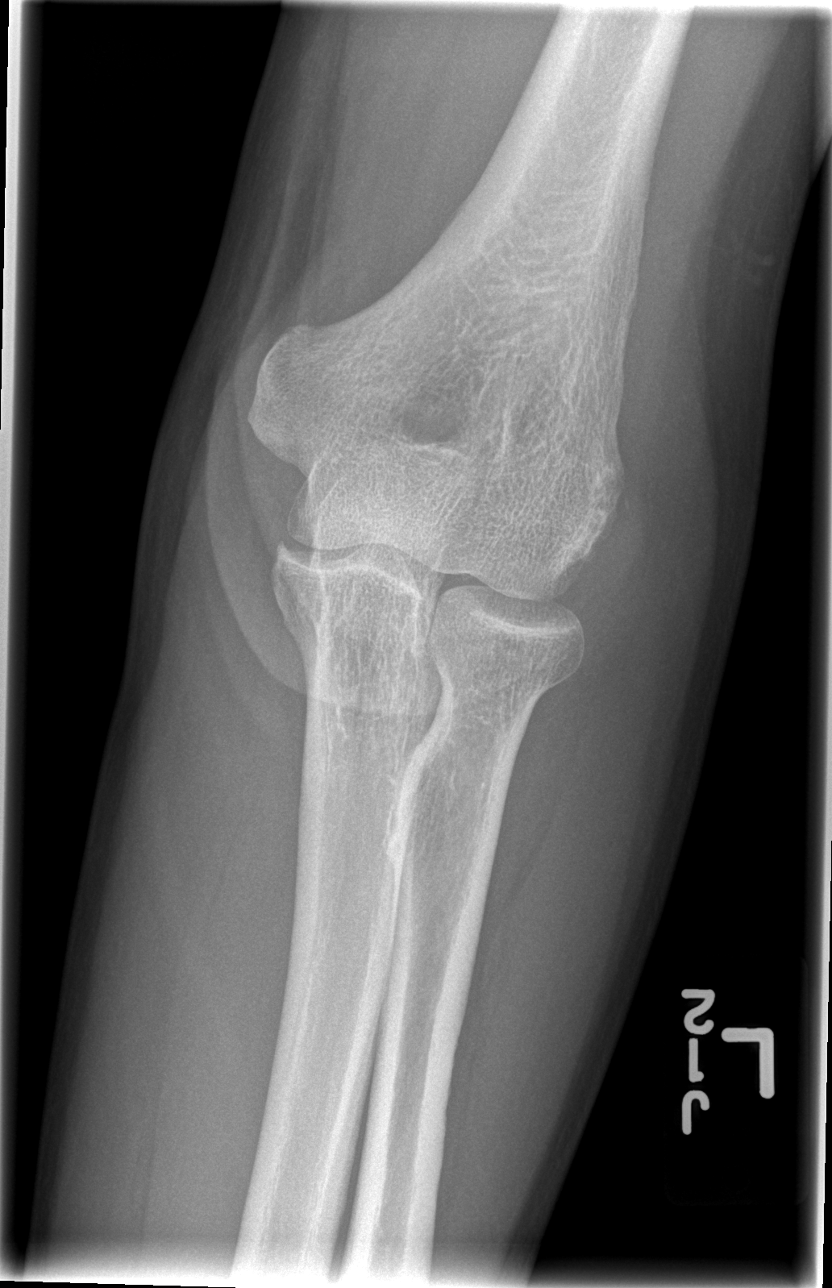

[x elbow joint obl. left (2 of 2)]
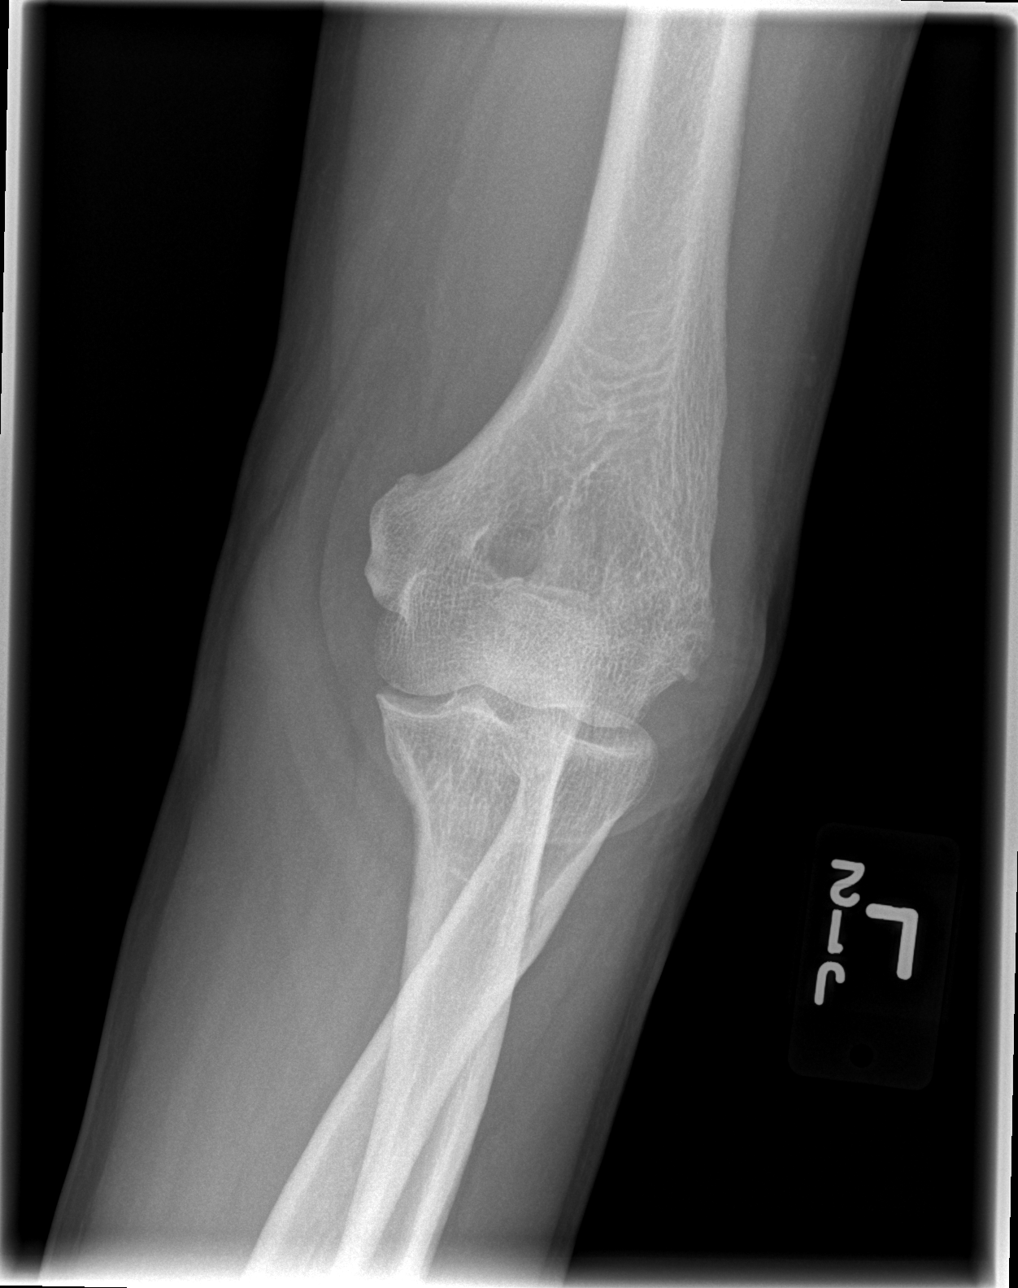

[4 of 4 positions shown; findings below may reference images not displayed]

FINDINGS: Lucency along the posterior distal humerus is compatible with a fat
pad sign. Findings are compatible with an elbow joint effusion.
There is suspicion for a subtle fracture at the radial neck. In
addition, there is subtle cortical irregularity along the medial
humeral condyle. The left elbow is located.
IMPRESSION: There is an elbow joint effusion and concern for an underlying
fracture. Questionable injuries in radial neck and medial humeral
condyle.

## 2016-03-04 IMAGING — CR DG KNEE COMPLETE 4+V*L*
4 series · 4 of 4 positions shown · non-contrast
Comparison: None.

CLINICAL DATA: Fall x today getting off lawn mower. Pt has Lt knee
pain with swelling and abrasion overlying patella. Pt unable to bear
full weight. No old injury known. HX: LT knee replacement

EXAM:
LEFT KNEE - COMPLETE 4+ VIEW

[t knee ap left]
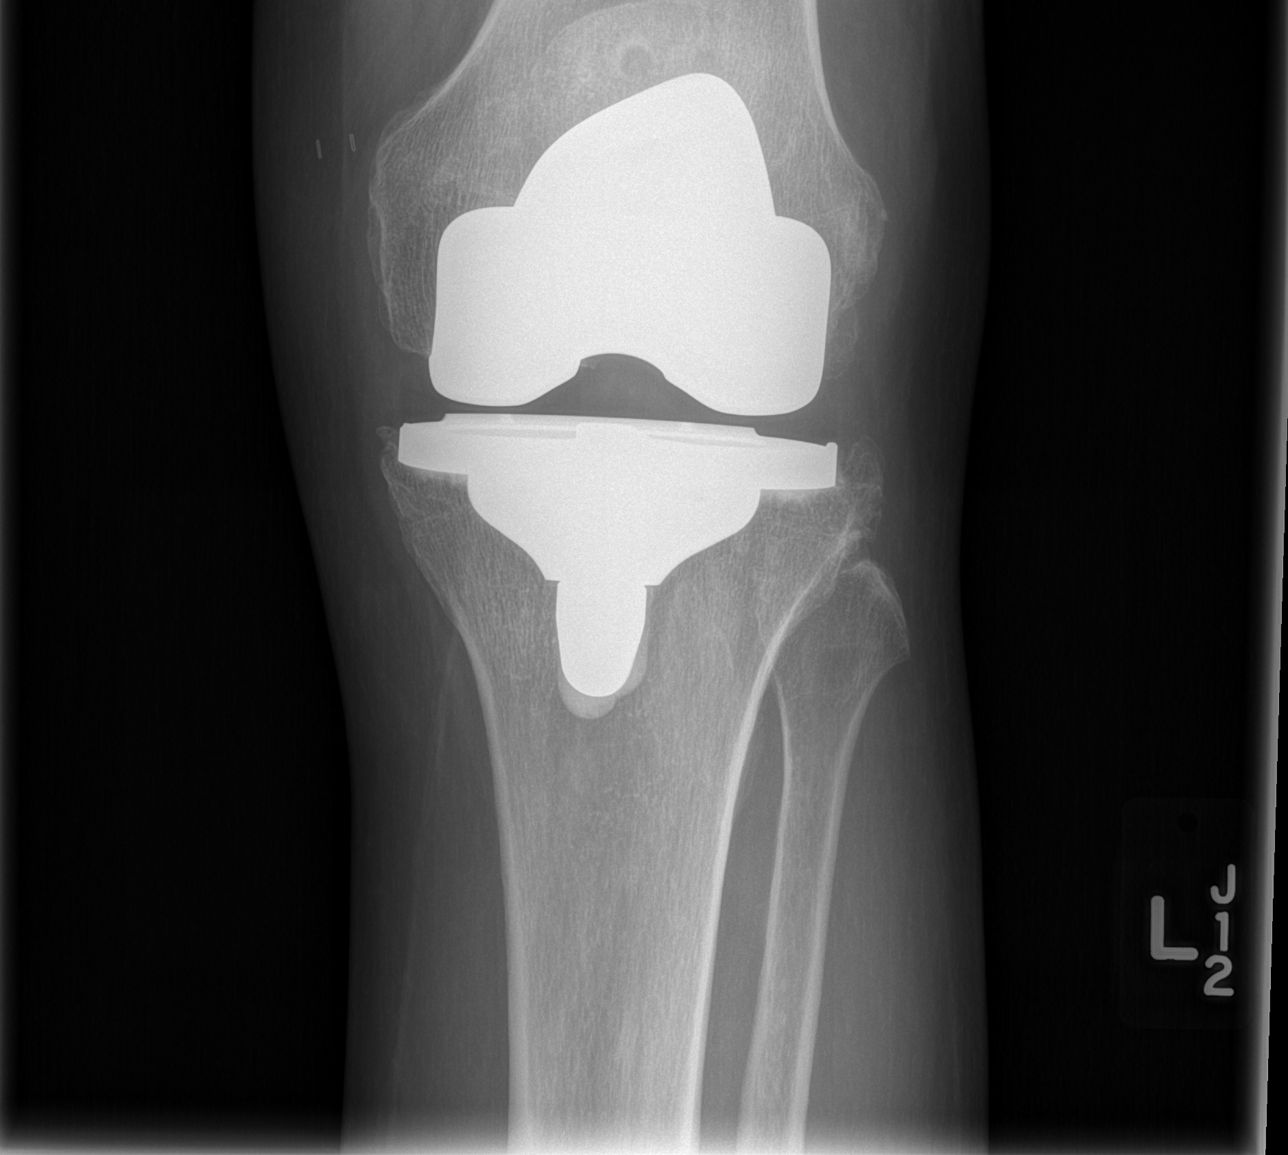

[t knee oblique left (1 of 2)]
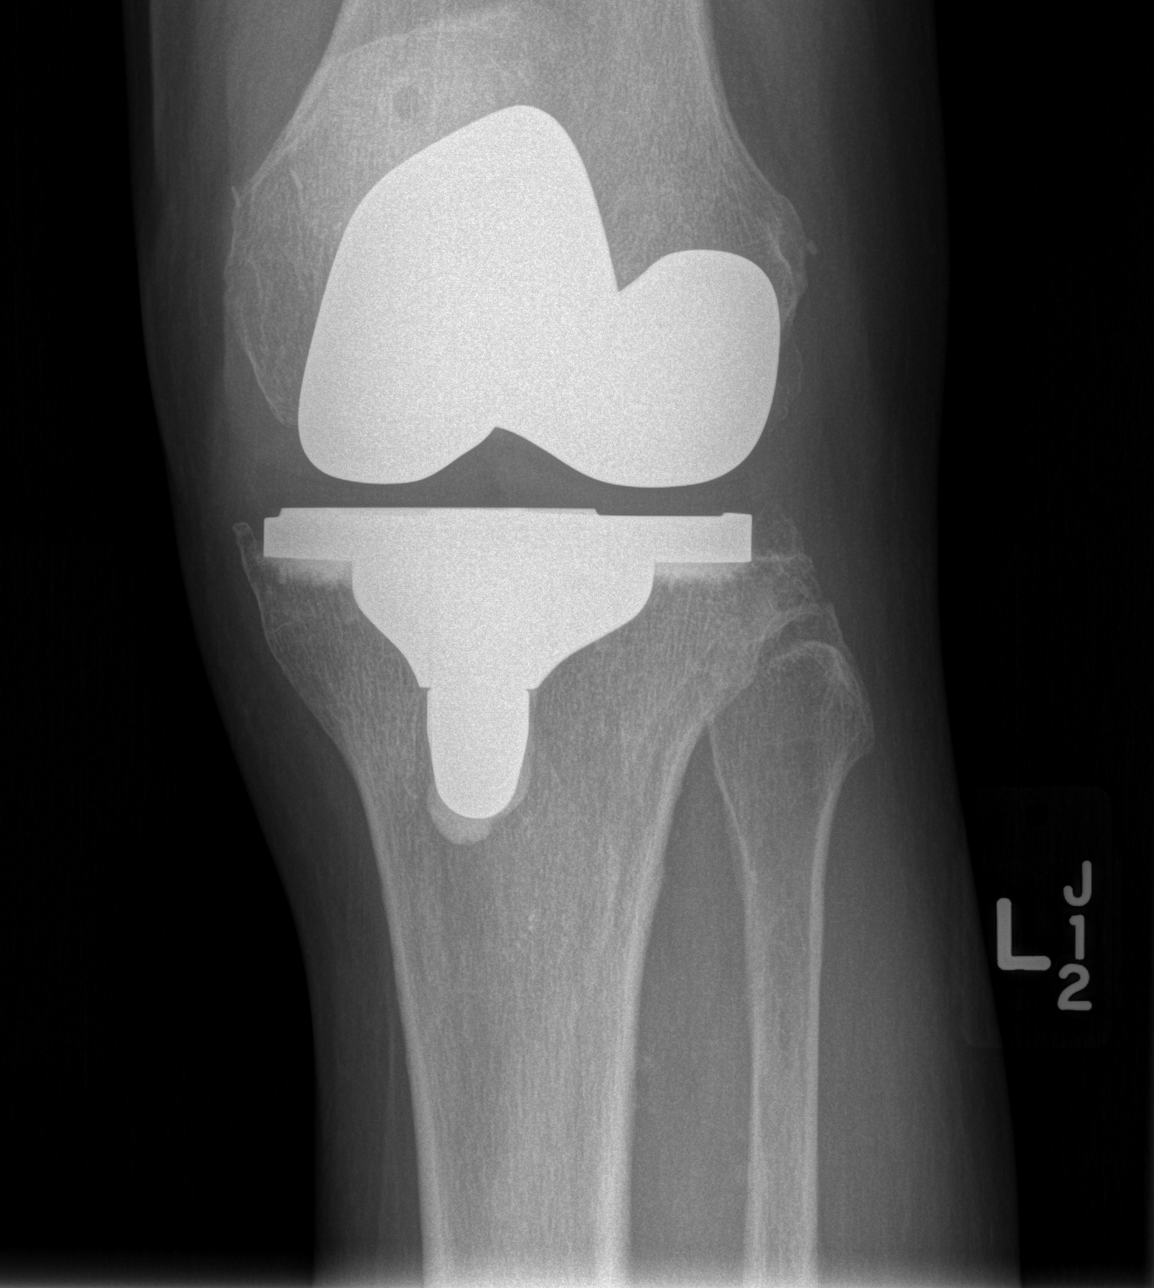

[t knee oblique left (2 of 2)]
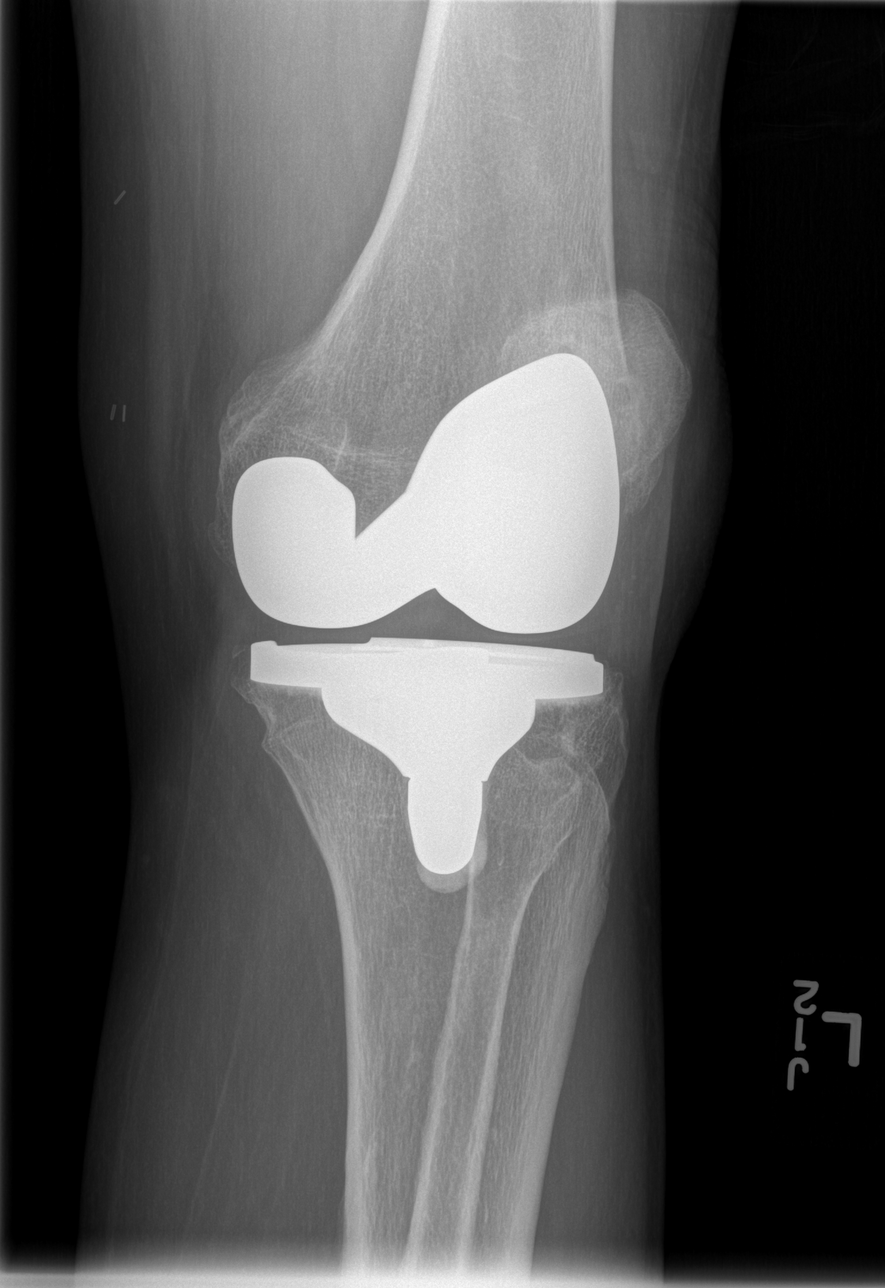

[t knee lat left]
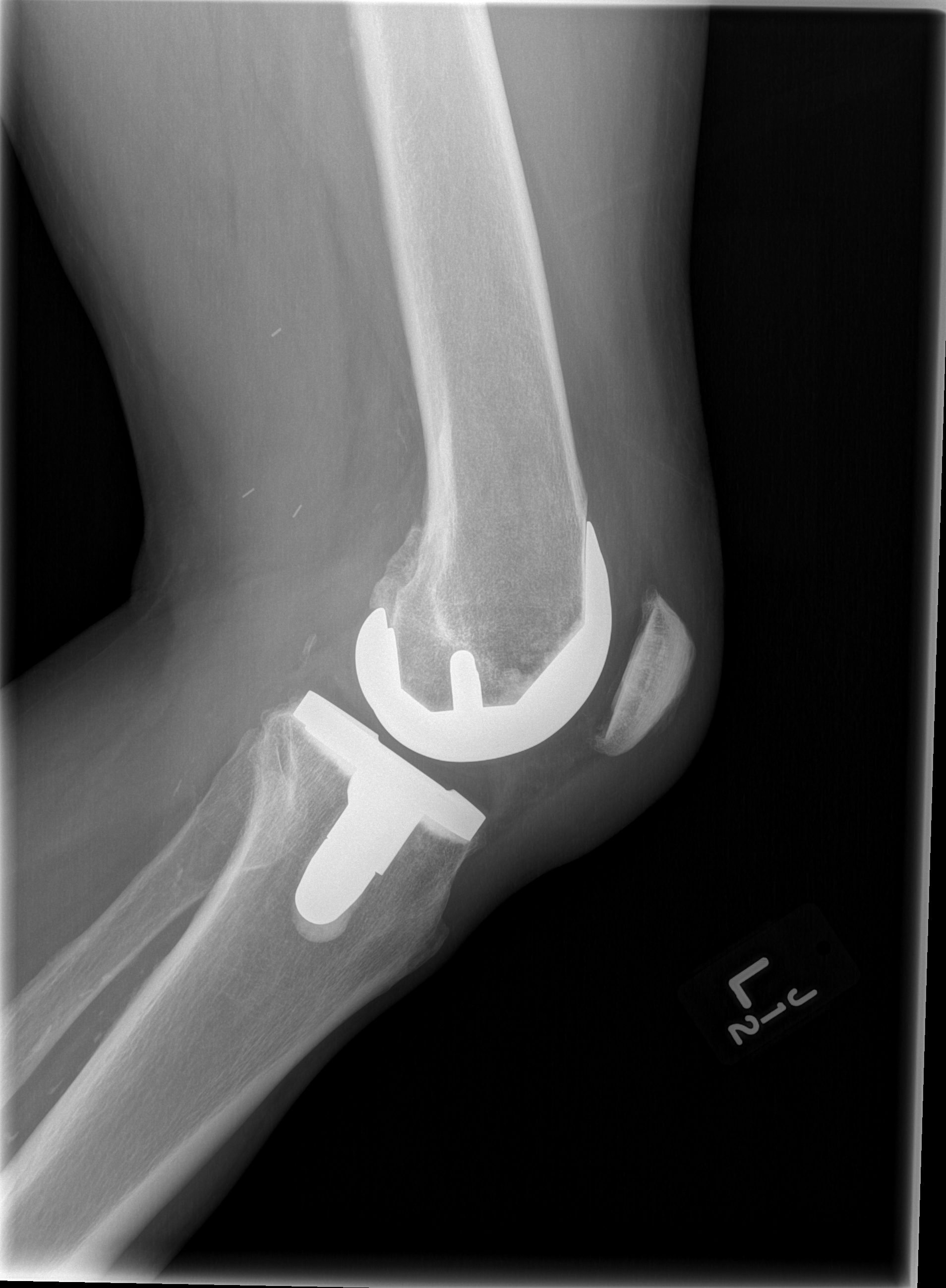

[4 of 4 positions shown; findings below may reference images not displayed]

FINDINGS: No fracture.  No bone lesion.

Knee prosthetic components are well seated well aligned. No evidence
of loosening.

No joint effusion.  Soft tissues are unremarkable.
IMPRESSION: 1. No fracture or dislocation.
2. No evidence of loosening of the orthopedic hardware.

## 2016-03-04 MED ORDER — OXYCODONE-ACETAMINOPHEN 5-325 MG PO TABS
1.0000 | ORAL_TABLET | ORAL | Status: DC | PRN
  Administered 2016-03-04: 1 via ORAL
  Filled 2016-03-04: qty 1

## 2016-03-04 MED ORDER — OXYCODONE-ACETAMINOPHEN 5-325 MG PO TABS: 1.0000 | ORAL_TABLET | Freq: Four times a day (QID) | ORAL | Status: DC | PRN

## 2016-03-04 NOTE — ED Notes (Signed)
Patient states he was getting off of his lawn mower today around 1200.  States he tripped on his foot and fell on to concrete on his left side.  C/O pain left elbow and wrist, left great toe and bilateral knee pain. No loc.

## 2016-03-04 NOTE — ED Provider Notes (Signed)
CSN: [id number]     Arrival date & time 03/04/16  1506 History   First MD Initiated Contact with Patient 03/04/16 1511     Chief Complaint  Patient presents with  . Fall     (Consider location/radiation/quality/duration/timing/severity/associated sxs/prior Treatment) HPI Comments: 75 year old male with history of atrial fibrillation on Coumadin, hypertension, diabetes mellitus presents following a fall. About 3 hours prior to presentation the patient was out cutting his grass and got off the lawnmower but his feet got tripped up and he fell on his left side. Reports that he felt mainly on his left elbow but also fell on his bilateral knees in believes that his cheek hit against his hand on the ground. Denies loss of consciousness. Got up off the ground himself and continued what he was doing an finish cutting the grass. He went inside and didn't say anything to his wife but she noticed that he was grimacing and complaining of pain in his left elbow. She encouraged him to come to the emergency department. He says that most of his pain is in his left elbow region and then it makes it hard to firmly grasp things and that he is not able to fully extend from the elbow.   Past Medical History  Diagnosis Date  . Cancer (HCC)     skin & squamous cell  . Diabetes mellitus without complication ([place])   . GERD (gastroesophageal reflux disease)   . Hyperlipidemia   . Hypertension   . Coronary artery disease   . PAF (paroxysmal atrial fibrillation) ([place])   . Dysrhythmia   . Pneumonia   . Arthritis   . AAA (abdominal aortic aneurysm) [facility])    Past Surgical History  Procedure Laterality Date  . Exploration post operative open heart    . Urinary surgery      scar tissue  . Ankle surgery      right fused  . Knee surgery      replacement on both knees  . Back surgery      low back   . Finger surgery      skin graft on rt index  . Coronary artery bypass graft  2004    [place]  .  Tonsillectomy    . Colonoscopy    . Hernia repair Right     RIH  . Cholecystectomy N/A 12/07/2013    Procedure: LAPAROSCOPIC CHOLECYSTECTOMY ;  Surgeon: [name], MD;  Location: [place];  Service: General;  Laterality: N/A;  . Joint replacement    . Prostate surgery    . Endarterectomy Right 12/10/2015    Procedure: Right Carotid ENDARTERECTOMY;  Surgeon: Conrad Bancroft, MD;  Location: [place];  Service: Vascular;  Laterality: Right;  . Finger surgery Right     right index finger.   Family History  Problem Relation Age of Onset  . Stroke Mother   . Heart disease Mother   . Heart disease Father   . Hypertension Father   . Hyperlipidemia Father   . Cancer Sister     breast  and skin  . Diabetes Sister   . Hypertension Sister   . Diabetes Brother   . Heart disease Brother   . Hyperlipidemia Brother   . Hypertension Brother   . Early death Neg Hx    Social History  Substance Use Topics  . Smoking status: Former Smoker -- 1.00 packs/day for 13 years    Quit date: 11/30/1970  . Smokeless tobacco: Never Used  .  Alcohol Use: No    Review of Systems  Constitutional: Negative for fever, chills and fatigue.  HENT: Negative for congestion, dental problem and nosebleeds.   Eyes: Negative for pain and visual disturbance.  Respiratory: Negative for cough and shortness of breath.   Gastrointestinal: Negative for nausea, vomiting, abdominal pain and diarrhea.  Genitourinary: Negative for dysuria and hematuria.  Musculoskeletal: Positive for arthralgias (left elbow, left wrist, left foot, bilateral knees).  Skin: Positive for wound (abrasion of bilateral knees).  Neurological: Positive for weakness (left hand). Negative for dizziness, numbness and headaches.  Hematological: Bruises/bleeds easily (last INR 3.0).      Allergies  Lisinopril; Losartan; Nifedipine; Triamterene; Amoxicillin; Ampicillin; and Carvedilol  Home Medications   Prior to Admission medications   Medication  Sig Start Date End Date Taking? Authorizing Provider  aspirin 81 MG tablet Take 81 mg by mouth daily.   Yes Historical Provider, MD  atorvastatin (LIPITOR) 40 MG tablet Take 40 mg by mouth daily.   Yes Historical Provider, MD  cholecalciferol (VITAMIN D) 1000 UNITS tablet Take 1,000 Units by mouth daily.   Yes Historical Provider, MD  cloNIDine (CATAPRES) 0.1 MG tablet TAKE ONE TABLET BY MOUTH TWICE DAILY 01/01/16  Yes [name], MD  glucose blood test strip Use to test blood glucose level 2 times a day. 01/24/16  Yes Virginia Crews, MD  metFORMIN (GLUCOPHAGE) 1000 MG tablet Take 1 tablet (1,000 mg total) by mouth 2 (two) times daily with a meal. 01/21/16  Yes Virginia Crews, MD  nitroGLYCERIN (NITROSTAT) 0.4 MG SL tablet Place 1 tablet (0.4 mg total) under the tongue every 5 (five) minutes as needed for chest pain. 04/25/14  Yes [name], MD  omeprazole (PRILOSEC) 20 MG capsule Take 20 mg by mouth daily.   Yes Historical Provider, MD  spironolactone (ALDACTONE) 25 MG tablet TAKE ONE TABLET BY MOUTH ONCE DAILY 10/23/15  Yes [name], MD  vitamin C (ASCORBIC ACID) 500 MG tablet Take 500 mg by mouth daily.   Yes Historical Provider, MD  warfarin (COUMADIN) 10 MG tablet Take 10 mg by mouth daily.   Yes Historical Provider, MD  hydrALAZINE (APRESOLINE) 25 MG tablet Take 1 tablet (25 mg total) by mouth 3 (three) times daily. 03/02/16   Liliane Shi, PA-C  oxyCODONE-acetaminophen (PERCOCET/ROXICET) 5-325 MG tablet Take 1-2 tablets by mouth every 6 (six) hours as needed for moderate pain or severe pain. 03/04/16   Harvel Quale, MD   BP 164/102 mmHg  Pulse 65  Temp(Src) 97.9 F (36.6 C) (Oral)  Resp 18  SpO2 97% Physical Exam  Constitutional: He is oriented to person, place, and time. He appears well-developed and well-nourished. No distress.  HENT:  Head: Normocephalic and atraumatic.  Right Ear: External ear normal. No hemotympanum.  Left Ear: External ear normal. No  hemotympanum.  Mouth/Throat: Oropharynx is clear and moist. No oropharyngeal exudate.  Neck: Normal range of motion and full passive range of motion without pain. No spinous process tenderness and no muscular tenderness present. Normal range of motion present.  Cardiovascular: Normal rate, normal heart sounds and intact distal pulses.   No murmur heard. Pulmonary/Chest: Effort normal and breath sounds normal. No respiratory distress. He has no wheezes. He has no rales. He exhibits no tenderness.  Abdominal: Soft. He exhibits no distension. There is no tenderness. There is no rebound.  Musculoskeletal:       Left shoulder: Normal.       Left wrist: He exhibits  tenderness and bony tenderness (over posterior middle wrist). He exhibits normal range of motion, no effusion, no crepitus, no deformity and no laceration.       Right hip: Normal.       Left hip: Normal.       Right knee: He exhibits swelling. He exhibits normal range of motion. No tenderness found.       Left knee: He exhibits swelling. He exhibits normal range of motion. No tenderness found.       Cervical back: Normal.       Left upper arm: Normal.       Left forearm: He exhibits tenderness and bony tenderness (with decreased extension).       Left foot: There is normal capillary refill and no crepitus.       Feet:  Neurological: He is alert and oriented to person, place, and time. No cranial nerve deficit. He exhibits normal muscle tone.  Decreased grip strength in the left hand  Skin: Abrasion (as indicated in red) and bruising (as indicated in blue) noted. He is not diaphoretic.     Vitals reviewed.   ED Course  Procedures (including critical care time) Labs Review Labs Reviewed  PROTIME-INR - Abnormal; Notable for the following:    Prothrombin Time 28.3 (*)    INR 2.70 (*)    All other components within normal limits    Imaging Review No results found. I have personally reviewed and evaluated these images and lab  results as part of my medical decision-making.   EKG Interpretation None      MDM  Patient seen and evaluated in stable condition.  Ct head unremarkable.  INR 2.7.  Elbow xray concerning for fracture - this was discussed with Dr. Apolonio Schneiders who agreed with plan for long arm splint and outpatient follow up in his office.  Great toe with small fracture - post op shoe concerning for causing fall.  Patient and family educated on need for supportive shoe, ice, elevation, and orthopedic follow up.  Patient and family expressed understanding and agreement with plan for discharge and outpatient follow up.  All questions answered prior to discharge and strict return precautions given. Final diagnoses:  Elbow fracture, left, open, initial encounter  Toe fracture, left, closed, initial encounter    1. Left elbow fracture  2. Left great toe fracture    Harvel Quale, MD 03/06/16 2233

## 2016-03-04 NOTE — Discharge Instructions (Signed)
You were seen and evaluated today after fall. It appears that you have a left elbow fracture. Follow-up with orthopedic hand specialist as instructed. Also asked them to reevaluate your left wrist while they also look at your left elbow. He had a small fracture of the great toe on your left foot. At this time this should heal on its own. If it is not healing follow-up with either your primary care physician or ask orthopedic office for this to also be reevaluated.  Elbow Fracture, Simple A fracture is a break in one of the bones.When fractures are not displaced or separated, they may be treated with only a sling or splint. The sling or splint may only be required for two to three weeks. In these cases, often the elbow is put through early range of motion exercises to prevent the elbow from getting stiff. DIAGNOSIS  The diagnosis (learning what is wrong) of a fractured elbow is made by x-ray. These may be required before and after the elbow is put into a splint or cast. X-rays are taken after to make sure the bone pieces have not moved. HOME CARE INSTRUCTIONS   Only take over-the-counter or prescription medicines for pain, discomfort, or fever as directed by your caregiver.  If you have a splint held on with an elastic wrap and your hand or fingers become numb or cold and blue, loosen the wrap and reapply more loosely. See your caregiver if there is no relief.  You may use ice for twenty minutes, four times per day, for the first two to three days.  Use your elbow as directed.  See your caregiver as directed. It is very important to keep all follow-up referrals and appointments in order to avoid any long-term problems with your elbow including chronic pain or stiffness. SEEK IMMEDIATE MEDICAL CARE IF:   There is swelling or increasing pain in elbow.  You begin to lose feeling or experience numbness or tingling in your hand or fingers.  You develop swelling of the hand and fingers.  You get a  cold or blue hand or fingers on affected side. MAKE SURE YOU:   Understand these instructions.  Will watch your condition.  Will get help right away if you are not doing well or get worse.   This information is not intended to replace advice given to you by your health care provider. Make sure you discuss any questions you have with your health care provider.   Document Released: 11/10/2001 Document Revised: 02/08/2012 Document Reviewed: 10/01/2009 Elsevier Interactive Patient Education 2016 Elsevier [employer]  Toe Fracture A toe fracture is a break in one of the toe bones (phalanges). CAUSES This condition may be caused by:  Dropping a heavy object on your toe.  Stubbing your toe.  Overusing your toe or doing repetitive exercise.  Twisting or stretching your toe out of place. RISK FACTORS This condition is more likely to develop in people who:  Play contact sports.  Have a bone disease.  Have a low calcium level. SYMPTOMS The main symptoms of this condition are swelling and pain in the toe. The pain may get worse with standing or walking. Other symptoms include:  Bruising.  Stiffness.  Numbness.  A change in the way the toe looks.  Broken bones that poke through the skin.  Blood beneath the toenail. DIAGNOSIS This condition is diagnosed with a physical exam. You may also have X-rays. TREATMENT  Treatment for this condition depends on the type of fracture and  its severity. Treatment may involve:  Taping the broken toe to a toe that is next to it (buddy taping). This is the most common treatment for fractures in which the bone has not moved out of place (nondisplaced fracture).  Wearing a shoe that has a wide, rigid sole to protect the toe and to limit its movement.  Wearing a walking cast.  Having a procedure to move the toe back into place.  Surgery. This may be needed:  If there are many pieces of broken bone that are out of place (displaced).  If the  toe joint breaks.  If the bone breaks through the skin.  Physical therapy. This is done to help regain movement and strength in the toe. You may need follow-up X-rays to make sure that the bone is healing well and staying in position. HOME CARE INSTRUCTIONS If You Have a Cast:  Do not stick anything inside the cast to scratch your skin. Doing that increases your risk of infection.  Check the skin around the cast every day. Report any concerns to your health care provider. You may put lotion on dry skin around the edges of the cast. Do not apply lotion to the skin underneath the cast.  Do not put pressure on any part of the cast until it is fully hardened. This may take several hours.  Keep the cast clean and dry. Bathing  Do not take baths, swim, or use a hot tub until your health care provider approves. Ask your health care provider if you can take showers. You may only be allowed to take sponge baths for bathing.  If your health care provider approves bathing and showering, cover the cast or bandage (dressing) with a watertight plastic bag to protect it from water. Do not let the cast or dressing get wet. Managing Pain, Stiffness, and Swelling  If you do not have a cast, apply ice to the injured area, if directed.  Put ice in a plastic bag.  Place a towel between your skin and the bag.  Leave the ice on for 20 minutes, 2-3 times per day.  Move your toes often to avoid stiffness and to lessen swelling.  Raise (elevate) the injured area above the level of your heart while you are sitting or lying down. Driving  Do not drive or operate heavy machinery while taking pain medicine.  Do not drive while wearing a cast on a foot that you use for driving. Activity  Return to your normal activities as directed by your health care provider. Ask your health care provider what activities are safe for you.  Perform exercises daily as directed by your health care provider or physical  therapist. Safety  Do not use the injured limb to support your body weight until your health care provider says that you can. Use crutches or other assistive devices as directed by your health care provider. General Instructions  If your toe was treated with buddy taping, follow your health care provider's instructions for changing the gauze and tape. Change it more often:  The gauze and tape get wet. If this happens, dry the space between the toes.  The gauze and tape are too tight and cause your toe to become pale or numb.  Wear a protective shoe as directed by your health care provider. If you were not given a protective shoe, wear sturdy, supportive shoes. Your shoes should not pinch your toes and should not fit tightly against your toes.  Do not  use any tobacco products, including cigarettes, chewing tobacco, or e-cigarettes. Tobacco can delay bone healing. If you need help quitting, ask your health care provider.  Take medicines only as directed by your health care provider.  Keep all follow-up visits as directed by your health care provider. This is important. SEEK MEDICAL CARE IF:  You have a fever.  Your pain medicine is not helping.  Your toe is cold.  Your toe is numb.  You still have pain after one week of rest and treatment.  You still have pain after your health care provider has said that you can start walking again.  You have pain, tingling, or numbness in your foot that is not going away. SEEK IMMEDIATE MEDICAL CARE IF:  You have severe pain.  You have redness or inflammation in your toe that is getting worse.  You have pain or numbness in your toe that is getting worse.  Your toe turns blue.   This information is not intended to replace advice given to you by your health care provider. Make sure you discuss any questions you have with your health care provider.   Document Released: 11/13/2000 Document Revised: 08/07/2015 Document Reviewed:  09/12/2014 Elsevier Interactive Patient Education [employer].

## 2016-03-05 ENCOUNTER — Emergency Department (HOSPITAL_BASED_OUTPATIENT_CLINIC_OR_DEPARTMENT_OTHER)
Admission: EM | Admit: 2016-03-05 | Discharge: 2016-03-05 | Disposition: A | Discharge status: Home / Self Care | Payer: Medicare Other | Attending: Emergency Medicine | Admitting: Emergency Medicine

## 2016-03-05 ENCOUNTER — Encounter (HOSPITAL_BASED_OUTPATIENT_CLINIC_OR_DEPARTMENT_OTHER): Payer: Self-pay | Admitting: Emergency Medicine

## 2016-03-05 DIAGNOSIS — W19XXXD Unspecified fall, subsequent encounter: Secondary | ICD-10-CM

## 2016-03-05 DIAGNOSIS — Z7984 Long term (current) use of oral hypoglycemic drugs: Secondary | ICD-10-CM | POA: Diagnosis not present

## 2016-03-05 DIAGNOSIS — Z7982 Long term (current) use of aspirin: Secondary | ICD-10-CM | POA: Insufficient documentation

## 2016-03-05 DIAGNOSIS — M1990 Unspecified osteoarthritis, unspecified site: Secondary | ICD-10-CM | POA: Diagnosis not present

## 2016-03-05 DIAGNOSIS — Z79899 Other long term (current) drug therapy: Secondary | ICD-10-CM | POA: Diagnosis not present

## 2016-03-05 DIAGNOSIS — Z880 Allergy status to penicillin: Secondary | ICD-10-CM | POA: Diagnosis not present

## 2016-03-05 DIAGNOSIS — Z7901 Long term (current) use of anticoagulants: Secondary | ICD-10-CM | POA: Insufficient documentation

## 2016-03-05 DIAGNOSIS — W010XXD Fall on same level from slipping, tripping and stumbling without subsequent striking against object, subsequent encounter: Secondary | ICD-10-CM | POA: Diagnosis not present

## 2016-03-05 DIAGNOSIS — E119 Type 2 diabetes mellitus without complications: Secondary | ICD-10-CM | POA: Diagnosis not present

## 2016-03-05 DIAGNOSIS — K219 Gastro-esophageal reflux disease without esophagitis: Secondary | ICD-10-CM | POA: Diagnosis not present

## 2016-03-05 DIAGNOSIS — Z87891 Personal history of nicotine dependence: Secondary | ICD-10-CM | POA: Diagnosis not present

## 2016-03-05 DIAGNOSIS — I2510 Atherosclerotic heart disease of native coronary artery without angina pectoris: Secondary | ICD-10-CM | POA: Insufficient documentation

## 2016-03-05 DIAGNOSIS — S59902D Unspecified injury of left elbow, subsequent encounter: Secondary | ICD-10-CM | POA: Insufficient documentation

## 2016-03-05 DIAGNOSIS — Z85828 Personal history of other malignant neoplasm of skin: Secondary | ICD-10-CM | POA: Diagnosis not present

## 2016-03-05 DIAGNOSIS — S6992XD Unspecified injury of left wrist, hand and finger(s), subsequent encounter: Secondary | ICD-10-CM | POA: Diagnosis not present

## 2016-03-05 DIAGNOSIS — I480 Paroxysmal atrial fibrillation: Secondary | ICD-10-CM | POA: Insufficient documentation

## 2016-03-05 DIAGNOSIS — M25532 Pain in left wrist: Secondary | ICD-10-CM | POA: Diagnosis not present

## 2016-03-05 DIAGNOSIS — I10 Essential (primary) hypertension: Secondary | ICD-10-CM | POA: Diagnosis not present

## 2016-03-05 MED ORDER — OXYCODONE-ACETAMINOPHEN 5-325 MG PO TABS
1.0000 | ORAL_TABLET | Freq: Once | ORAL | Status: AC
  Administered 2016-03-05: 1 via ORAL
  Filled 2016-03-05: qty 1

## 2016-03-05 MED ORDER — ONDANSETRON 4 MG PO TBDP
4.0000 mg | ORAL_TABLET | Freq: Once | ORAL | Status: AC
  Administered 2016-03-05: 4 mg via ORAL
  Filled 2016-03-05: qty 1

## 2016-03-05 NOTE — ED Notes (Signed)
MD at bedside. 

## 2016-03-05 NOTE — Discharge Instructions (Signed)
Your blood pressure today was mildly elevated at 158/83. Get your blood pressure rechecked in a week. Your Coumadin level (INR) should be rechecked within the next 2 weeks, as Percocet can affect Coumadin. Keep scheduled appointment with Dr.Ortmann for your injuries obtained yesterday. Return if concerned for any reason.

## 2016-03-05 NOTE — ED Notes (Addendum)
Patient c/o wrist pain throughout the night after having a cast placed yesterday evening. Fingers are warm, cap refill <3sec. Patient is able to move all digits. Patient was sent home with percocet for pain, last dose at 0430, states this is not helping at all.  Patient wife states she called the orthopedic for follow up but has not yet scheduled an appointment.   Patient reports hx of HTN, recently SBP has been in 200s, started new BP medication this morning.

## 2016-03-05 NOTE — ED Provider Notes (Signed)
CSN: [id number]     Arrival date & time 03/05/16  [id number] History   First MD Initiated Contact with Patient 03/05/16 [phone]     Chief Complaint  Patient presents with  . Wrist Pain    left     (Consider location/radiation/quality/duration/timing/severity/associated sxs/prior Treatment) HPI Complains of left wrist pain worse since evaluation and splint placed yesterday. He feels as if the splint is too tight. Patient was seen here yesterday after he tripped getting off of a lawnmower . He was determined to have an occult left elbow fracture. He was also diagnosed with a fracture of his first great toe. States his great toe is feeling well presently. He was placed in a long-arm splint. Prescribed Percocet which she's taken without relief. He states his elbow feels well presently he complains of wrist pain which is worse since the splint was placed. No other associated symptoms. Last treated himself with Percocet for 30 a.m. today. Past Medical History  Diagnosis Date  . Cancer (HCC)     skin & squamous cell  . Diabetes mellitus without complication ([place])   . GERD (gastroesophageal reflux disease)   . Hyperlipidemia   . Hypertension   . Coronary artery disease   . PAF (paroxysmal atrial fibrillation) ([place])   . Dysrhythmia   . Pneumonia   . Arthritis   . AAA (abdominal aortic aneurysm) [facility])    Past Surgical History  Procedure Laterality Date  . Exploration post operative open heart    . Urinary surgery      scar tissue  . Ankle surgery      right fused  . Knee surgery      replacement on both knees  . Back surgery      low back   . Finger surgery      skin graft on rt index  . Coronary artery bypass graft  2004    [place]  . Tonsillectomy    . Colonoscopy    . Hernia repair Right     [facility]  . Cholecystectomy N/A 12/07/2013    Procedure: LAPAROSCOPIC CHOLECYSTECTOMY ;  Surgeon: [name], MD;  Location: [place];  Service: General;  Laterality: N/A;  . Joint replacement     . Prostate surgery    . Endarterectomy Right 12/10/2015    Procedure: Right Carotid ENDARTERECTOMY;  Surgeon: Conrad , MD;  Location: [place];  Service: Vascular;  Laterality: Right;  . Finger surgery Right     right index finger.   Family History  Problem Relation Age of Onset  . Stroke Mother   . Heart disease Mother   . Heart disease Father   . Hypertension Father   . Hyperlipidemia Father   . Cancer Sister     breast  and skin  . Diabetes Sister   . Hypertension Sister   . Diabetes Brother   . Heart disease Brother   . Hyperlipidemia Brother   . Hypertension Brother   . Early death Neg Hx    Social History  Substance Use Topics  . Smoking status: Former Smoker -- 1.00 packs/day for 13 years    Quit date: 11/30/1970  . Smokeless tobacco: Never Used  . Alcohol Use: No    Review of Systems  Musculoskeletal: Positive for arthralgias.  All other systems reviewed and are negative.     Allergies  Lisinopril; Losartan; Nifedipine; Triamterene; Amoxicillin; Ampicillin; and Carvedilol  Home Medications   Prior to Admission medications   Medication Sig  Start Date End Date Taking? Authorizing Provider  aspirin 81 MG tablet Take 81 mg by mouth daily.    Historical Provider, MD  atorvastatin (LIPITOR) 40 MG tablet Take 40 mg by mouth daily.    Historical Provider, MD  cholecalciferol (VITAMIN D) 1000 UNITS tablet Take 1,000 Units by mouth daily.    Historical Provider, MD  cloNIDine (CATAPRES) 0.1 MG tablet TAKE ONE TABLET BY MOUTH TWICE DAILY 01/01/16   [name], MD  glucose blood test strip Use to test blood glucose level 2 times a day. 01/24/16   Virginia Crews, MD  hydrALAZINE (APRESOLINE) 25 MG tablet Take 1 tablet (25 mg total) by mouth 3 (three) times daily. 03/02/16   Liliane Shi, PA-C  metFORMIN (GLUCOPHAGE) 1000 MG tablet Take 1 tablet (1,000 mg total) by mouth 2 (two) times daily with a meal. 01/21/16   Virginia Crews, MD  nitroGLYCERIN  (NITROSTAT) 0.4 MG SL tablet Place 1 tablet (0.4 mg total) under the tongue every 5 (five) minutes as needed for chest pain. 04/25/14   [name], MD  omeprazole (PRILOSEC) 20 MG capsule Take 20 mg by mouth daily.    Historical Provider, MD  oxyCODONE-acetaminophen (PERCOCET/ROXICET) 5-325 MG tablet Take 1-2 tablets by mouth every 6 (six) hours as needed for moderate pain or severe pain. 03/04/16   Harvel Quale, MD  spironolactone (ALDACTONE) 25 MG tablet TAKE ONE TABLET BY MOUTH ONCE DAILY 10/23/15   [name], MD  vitamin C (ASCORBIC ACID) 500 MG tablet Take 500 mg by mouth daily.    Historical Provider, MD  warfarin (COUMADIN) 10 MG tablet Take 10 mg by mouth daily.    Historical Provider, MD   BP 163/84 mmHg  Pulse 69  Temp(Src) 97.7 F (36.5 C) (Oral)  Resp 18  Ht 6\' 2"  (1.88 m)  Wt 227 lb (102.967 kg)  BMI 29.13 kg/m2  SpO2 95% Physical Exam  Constitutional: He is oriented to person, place, and time. He appears well-developed and well-nourished.  HENT:  Head: Normocephalic and atraumatic.  Eyes: Conjunctivae are normal. Pupils are equal, round, and reactive to light.  Neck: Neck supple. No tracheal deviation present. No thyromegaly present.  Cardiovascular: Normal rate.   No murmur heard. Irregularly irregular  Pulmonary/Chest: Effort normal and breath sounds normal.  Abdominal: Soft. Bowel sounds are normal. He exhibits no distension. There is no tenderness.  Musculoskeletal: Normal range of motion. He exhibits no edema or tenderness.  Left upper extremity and long-arm splint and sling. Splint and sling removed to reveal skin intact. Elbow is mildly tender at lateral aspect. He is able to extend elbow to approximately 160. Wrist is tender at dorsal aspect. No snuffbox tenderness. Radial pulse 2+. Good capillary refill. Right upper extremity without deformity or or tenderness. Left lower extremity without deformity. Or swelling. Great toe is ecchymotic, good capillary  refill. DP pulse 2+. All other extremities a contusion abrasion or tenderness neurovascularly intact neurovascularly intact.  Neurological: He is alert and oriented to person, place, and time. No cranial nerve deficit. Coordination normal.  Gait normal  Skin: Skin is warm and dry. No rash noted.  Psychiatric: He has a normal mood and affect.  Nursing note and vitals reviewed.   ED Course  Procedures (including critical care time) Labs Review Labs Reviewed - No data to display  Imaging Review Dg Elbow Complete Left  03/04/2016  CLINICAL DATA:  [place] getting off of lawnmower. Posterior left elbow pain with swelling. EXAM: LEFT  ELBOW - COMPLETE 3+ VIEW COMPARISON:  Left humerus 03/04/2016 FINDINGS: Lucency along the posterior distal humerus is compatible with a fat pad sign. Findings are compatible with an elbow joint effusion. There is suspicion for a subtle fracture at the radial neck. In addition, there is subtle cortical irregularity along the medial humeral condyle. The left elbow is located. IMPRESSION: There is an elbow joint effusion and concern for an underlying fracture. Questionable injuries in radial neck and medial humeral condyle. Electronically Signed   By: Markus Daft M.D.   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 Pt has Rt wrist discomfort entirely. No old injury known. EXAM: LEFT WRIST - COMPLETE 3+ VIEW COMPARISON:  None. FINDINGS: No fracture. No bone lesions. Joints normally spaced and aligned. Mild calcification noted along the triangle fibrocartilage complex. IMPRESSION: No fracture or dislocation. Electronically Signed   By: Lajean Manes M.D.   On: 03/04/2016 16:51   Ct Head Wo Contrast  03/04/2016  CLINICAL DATA:  Fall on concrete today.  Pain under right eye. EXAM: CT HEAD WITHOUT CONTRAST TECHNIQUE: Contiguous axial images were obtained from the base of the skull through the vertex without intravenous  contrast. COMPARISON:  None. FINDINGS: No acute intracranial abnormality. Specifically, no hemorrhage, hydrocephalus, mass lesion, acute infarction, or significant intracranial injury. No acute calvarial abnormality. Visualized paranasal sinuses and mastoids clear. Orbital soft tissues unremarkable. IMPRESSION: No acute intracranial abnormality. Electronically Signed   By: Rolm Baptise M.D.   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 Pt has Lt knee pain with swelling and abrasion overlying patella. Pt unable to bear full weight. No old injury known. HX: LT knee replacement EXAM: LEFT KNEE - COMPLETE 4+ VIEW COMPARISON:  None. FINDINGS: No fracture.  No bone lesion. Knee prosthetic components are well seated well aligned. No evidence of loosening. No joint effusion.  Soft tissues are unremarkable. IMPRESSION: 1. No fracture or dislocation. 2. No evidence of loosening of the orthopedic hardware. Electronically Signed   By: Lajean Manes M.D.   On: 03/04/2016 16:49   Dg Knee Complete 4 Views Right  03/04/2016  CLINICAL DATA:  Fall today.  Right knee pain, abrasion EXAM: RIGHT KNEE - COMPLETE 4+ VIEW COMPARISON:  None. FINDINGS: Prior right knee replacement. No hardware or bony complicating feature. No joint effusion. No acute bony abnormality. Specifically, no fracture, subluxation, or dislocation. Soft tissues are intact. IMPRESSION: Right knee replacement.  No acute bony abnormality. Electronically Signed   By: Rolm Baptise M.D.   On: 03/04/2016 16:49   Dg Humerus Left  03/04/2016  CLINICAL DATA:  Fall today.  Left proximal humeral pain. EXAM: LEFT HUMERUS - 2+ VIEW COMPARISON:  None. FINDINGS: No acute bony abnormality. Specifically, no fracture, subluxation, or dislocation. Soft tissues are intact. Mild degenerative changes at the left shoulder. IMPRESSION: No acute bony abnormality. Electronically Signed   By: Rolm Baptise M.D.    On: 03/04/2016 16:48   Dg Foot Complete Left  03/04/2016  CLINICAL DATA:  Fall today. EXAM: LEFT FOOT - COMPLETE 3+ VIEW COMPARISON:  None. FINDINGS: Comminuted mildly displaced fracture of the first proximal phalanx extending into the metatarsophalangeal joint. No other fracture.  Calcaneal spurring. IMPRESSION: Mildly displaced intra-articular fracture of the first proximal phalanx. Electronically Signed   By: Franchot Gallo M.D.  On: 03/04/2016 16:48   I have personally reviewed and evaluated these images and lab results as part of my medical decision-making.   EKG Interpretation None     Patient felt immediately improved after long-arm splint removed. Sling was replaced and he was placed in a Velcro wrist splint. Pain is improved. Felt nauseated 9:10 AM. Oral Zofran ordered.   9:55 AM pain is improved in Velcro wrist splint and sling. Nausea is under control. He is resting comfortably. X-rays from yesterday's visit viewed by me. Lab work reviewed from yesterday's visit MDM  He declined prescription for antiemetic Plan follow-up with Dr. Apolonio Schneiders as previously advised. Blood pressure recheck 1 week INR rechecked within the next 2 weeks Diagnosis #1 fall #2 elevated blood pressure Final diagnoses:  None      Orlie Dakin, MD 03/05/16 1002

## 2016-03-05 NOTE — ED Notes (Signed)
Splint removed per MD request. MD at bedside to assess patient arm

## 2016-03-05 NOTE — ED Notes (Signed)
Ice pack applied.

## 2016-03-06 DIAGNOSIS — S59802A Other specified injuries of left elbow, initial encounter: Secondary | ICD-10-CM | POA: Diagnosis not present

## 2016-03-06 DIAGNOSIS — S92415A Nondisplaced fracture of proximal phalanx of left great toe, initial encounter for closed fracture: Secondary | ICD-10-CM | POA: Diagnosis not present

## 2016-03-06 DIAGNOSIS — S6982XA Other specified injuries of left wrist, hand and finger(s), initial encounter: Secondary | ICD-10-CM | POA: Diagnosis not present

## 2016-03-10 ENCOUNTER — Other Ambulatory Visit: Payer: Self-pay | Admitting: Cardiovascular Disease

## 2016-03-10 ENCOUNTER — Ambulatory Visit (INDEPENDENT_AMBULATORY_CARE_PROVIDER_SITE_OTHER): Payer: Medicare Other | Admitting: Podiatry

## 2016-03-10 DIAGNOSIS — E1140 Type 2 diabetes mellitus with diabetic neuropathy, unspecified: Secondary | ICD-10-CM | POA: Diagnosis not present

## 2016-03-10 DIAGNOSIS — M216X1 Other acquired deformities of right foot: Secondary | ICD-10-CM

## 2016-03-10 DIAGNOSIS — M21961 Unspecified acquired deformity of right lower leg: Secondary | ICD-10-CM

## 2016-03-10 DIAGNOSIS — R0989 Other specified symptoms and signs involving the circulatory and respiratory systems: Secondary | ICD-10-CM

## 2016-03-10 NOTE — Patient Instructions (Signed)

## 2016-03-10 NOTE — Progress Notes (Signed)
Patient ID: Thomas Marshall, male   DOB: 05/18/1941, 75 y.o.   MRN: [id number] Patient presents for diabetic shoe pick up, shoes are tried on for good fit.  Patient received 1 Pair New Balance [phone] Grey in men's size 12.5 wide and 3 pairs custom molded diabetic inserts with pocket accomodation for base of the 5th met right.  Verbal and written break in and wear instructions given.  Patient will follow up for scheduled routine care.   Objective: No open skin lesions Loss of vibratory sensation right Absent left DP  Assessment: Satisfactory fit of diabetic shoes 2 and custom insoles 6 with wearing instruction provided  Reappoint for follow-up at next scheduled visit or sooner if patient has concern

## 2016-03-16 ENCOUNTER — Ambulatory Visit (INDEPENDENT_AMBULATORY_CARE_PROVIDER_SITE_OTHER): Payer: Medicare Other | Admitting: *Deleted

## 2016-03-16 DIAGNOSIS — I480 Paroxysmal atrial fibrillation: Secondary | ICD-10-CM | POA: Diagnosis not present

## 2016-03-16 DIAGNOSIS — M25572 Pain in left ankle and joints of left foot: Secondary | ICD-10-CM | POA: Diagnosis not present

## 2016-03-16 DIAGNOSIS — S6982XD Other specified injuries of left wrist, hand and finger(s), subsequent encounter: Secondary | ICD-10-CM | POA: Diagnosis not present

## 2016-03-16 DIAGNOSIS — I4891 Unspecified atrial fibrillation: Secondary | ICD-10-CM

## 2016-03-16 DIAGNOSIS — S59802D Other specified injuries of left elbow, subsequent encounter: Secondary | ICD-10-CM | POA: Diagnosis not present

## 2016-03-16 DIAGNOSIS — S92415A Nondisplaced fracture of proximal phalanx of left great toe, initial encounter for closed fracture: Secondary | ICD-10-CM | POA: Diagnosis not present

## 2016-03-16 DIAGNOSIS — Z7901 Long term (current) use of anticoagulants: Secondary | ICD-10-CM

## 2016-03-16 DIAGNOSIS — M25472 Effusion, left ankle: Secondary | ICD-10-CM | POA: Diagnosis not present

## 2016-03-16 LAB — POCT INR: INR: 2.6

## 2016-03-19 ENCOUNTER — Telehealth: Payer: Self-pay | Admitting: Physician Assistant

## 2016-03-19 NOTE — Telephone Encounter (Signed)
How are his BPs doing with the Hydralazine?  He can try to take Hydralazine Twice daily to see if this helps.  Otherwise, I would rec he DC Hydralazine and start Terazosin 1 mg once daily at bedtime x 1 week, then increase to 2 mg once daily at bedtime.  Richardson Dopp, PA-C   03/19/2016 5:13 PM

## 2016-03-19 NOTE — Telephone Encounter (Signed)
New message   Pt verbalized that he is on Hydralazine 25mg  3x day  And it is making him sad, he dont feel like his normal self  Makes him feel tired he is not sure if it is the medication  It is not a issue he just wants to know if scott would want him to take less than 3 a day or not

## 2016-03-19 NOTE — Telephone Encounter (Signed)
SPOKE WITH  PT  COMPLAINING OF  NO ENERGY  SINCE STARTING HYDRALAZINE   FEELS  RUN DOWN .WILL FORWARD TO [facility] FOR REVIEW .Adonis Housekeeper

## 2016-03-20 NOTE — Telephone Encounter (Signed)
Ok If he does not feel better on Twice daily Hydralazine, call and we can change to Terazosin. Richardson Dopp, PA-C   03/20/2016 1:42 PM

## 2016-03-20 NOTE — Telephone Encounter (Signed)
PT  AWARE OF MED  CHANGE  WILL NEXT  WEEK WITH  UPDATE  AND  B/P READINGS ./CY .0 +1

## 2016-03-24 NOTE — Telephone Encounter (Signed)
SPOKE WITH PT the patient  IS  FEELING  BETTER  AND  B/P THIS  AM  WAS 115/60 WILL CONTINUE  HYDRALAZINE  BID  AND  IF NOTES  B/P  INCREASING  WILL CALL OFFICE .Adonis Housekeeper

## 2016-03-30 DIAGNOSIS — S92415D Nondisplaced fracture of proximal phalanx of left great toe, subsequent encounter for fracture with routine healing: Secondary | ICD-10-CM | POA: Diagnosis not present

## 2016-03-30 DIAGNOSIS — S6982XD Other specified injuries of left wrist, hand and finger(s), subsequent encounter: Secondary | ICD-10-CM | POA: Diagnosis not present

## 2016-03-30 DIAGNOSIS — M25572 Pain in left ankle and joints of left foot: Secondary | ICD-10-CM | POA: Diagnosis not present

## 2016-04-02 ENCOUNTER — Encounter: Payer: Self-pay | Admitting: Physician Assistant

## 2016-04-02 ENCOUNTER — Ambulatory Visit (INDEPENDENT_AMBULATORY_CARE_PROVIDER_SITE_OTHER): Payer: Medicare Other | Admitting: Physician Assistant

## 2016-04-02 VITALS — BP 150/60 | HR 70 | Ht 74.0 in | Wt 222.4 lb

## 2016-04-02 DIAGNOSIS — I739 Peripheral vascular disease, unspecified: Secondary | ICD-10-CM

## 2016-04-02 DIAGNOSIS — R0683 Snoring: Secondary | ICD-10-CM

## 2016-04-02 DIAGNOSIS — I714 Abdominal aortic aneurysm, without rupture, unspecified: Secondary | ICD-10-CM

## 2016-04-02 DIAGNOSIS — I10 Essential (primary) hypertension: Secondary | ICD-10-CM | POA: Diagnosis not present

## 2016-04-02 DIAGNOSIS — I6523 Occlusion and stenosis of bilateral carotid arteries: Secondary | ICD-10-CM

## 2016-04-02 DIAGNOSIS — I480 Paroxysmal atrial fibrillation: Secondary | ICD-10-CM

## 2016-04-02 DIAGNOSIS — E785 Hyperlipidemia, unspecified: Secondary | ICD-10-CM

## 2016-04-02 DIAGNOSIS — I2510 Atherosclerotic heart disease of native coronary artery without angina pectoris: Secondary | ICD-10-CM | POA: Diagnosis not present

## 2016-04-02 DIAGNOSIS — I779 Disorder of arteries and arterioles, unspecified: Secondary | ICD-10-CM

## 2016-04-02 MED ORDER — TERAZOSIN HCL 1 MG PO CAPS: 1.0000 mg | ORAL_CAPSULE | Freq: Every day | ORAL | Status: DC

## 2016-04-02 NOTE — Progress Notes (Signed)
Cardiology Office Note:    Date:  04/02/2016   ID:  Thomas Marshall, DOB 07-18-1941, MRN [id number]  PCP:  Lavon Paganini, MD  Cardiologist: Dr. [name]  Electrophysiologist: N/a Vascular Surgeon: Dr. Bridgett Larsson  Referring MD: Virginia Crews, MD   Chief Complaint  Patient presents with  . Follow-up    HTN    History of Present Illness:     Thomas Marshall is a 75 y.o. male with a hx of CAD status post CABG in 2004, HTN, HL, PAF, carotid stenosis, small AAA. He was previously on amiodarone. He is on Coumadin for anticoagulation. Last seen by Dr. Burt Knack 12/16. He underwent R CEA in 1/17 with Dr. Bridgett Larsson.   I saw him last month for elevated BP. He noted significantly elevated blood pressures since his carotid endarterectomy. I placed him on Hydralazine and scheduled sleep testing.    2 days later seen in the ED after falling.  Notes indicate he tripped when getting off his mower.  CT was neg for intracranial bleed.  Suffered elbow fx.  He called in 4/20 with fatigue after starting Hydralazine.  I rec he either cut back on Hydralazine to bid or change to Terazosin.    Returns for FU.  He had to stop taking Hydralazine.  It made him significantly fatigued.  He did have much better BP with taking this medication.  The patient denies chest pain, shortness of breath, syncope, orthopnea, PND or significant pedal edema.    Past Medical History  Diagnosis Date  . Cancer (HCC)     skin & squamous cell  . Diabetes mellitus without complication ([place])   . GERD (gastroesophageal reflux disease)   . Hyperlipidemia   . Hypertension   . Coronary artery disease   . PAF (paroxysmal atrial fibrillation) ([place])   . Dysrhythmia   . Pneumonia   . Arthritis   . AAA (abdominal aortic aneurysm) [facility])     Past Surgical History  Procedure Laterality Date  . Exploration post operative open heart    . Urinary surgery      scar tissue  . Ankle surgery      right fused  . Knee surgery     replacement on both knees  . Back surgery      low back   . Finger surgery      skin graft on rt index  . Coronary artery bypass graft  2004    [place]  . Tonsillectomy    . Colonoscopy    . Hernia repair Right     [facility]  . Cholecystectomy N/A 12/07/2013    Procedure: LAPAROSCOPIC CHOLECYSTECTOMY ;  Surgeon: [name], MD;  Location: [place];  Service: General;  Laterality: N/A;  . Joint replacement    . Prostate surgery    . Endarterectomy Right 12/10/2015    Procedure: Right Carotid ENDARTERECTOMY;  Surgeon: Conrad Garrison, MD;  Location: [place];  Service: Vascular;  Laterality: Right;  . Finger surgery Right     right index finger.    Current Medications: Outpatient Prescriptions Prior to Visit  Medication Sig Dispense Refill  . aspirin 81 MG tablet Take 81 mg by mouth daily.    [name] atorvastatin (LIPITOR) 40 MG tablet Take 40 mg by mouth daily.    . cholecalciferol (VITAMIN D) 1000 UNITS tablet Take 1,000 Units by mouth daily.    . cloNIDine (CATAPRES) 0.1 MG tablet TAKE ONE TABLET BY MOUTH TWICE DAILY 180 tablet  2  . glucose blood test strip Use to test blood glucose level 2 times a day. 100 each 12  . metFORMIN (GLUCOPHAGE) 1000 MG tablet Take 1 tablet (1,000 mg total) by mouth 2 (two) times daily with a meal. 180 tablet 3  . nitroGLYCERIN (NITROSTAT) 0.4 MG SL tablet Place 1 tablet (0.4 mg total) under the tongue every 5 (five) minutes as needed for chest pain. 25 tablet 3  . omeprazole (PRILOSEC) 20 MG capsule Take 20 mg by mouth daily.    [name] spironolactone (ALDACTONE) 25 MG tablet TAKE ONE TABLET BY MOUTH ONCE DAILY 90 tablet 1  . vitamin C (ASCORBIC ACID) 500 MG tablet Take 500 mg by mouth daily.    [name] warfarin (COUMADIN) 10 MG tablet TAKE ONE TABLET BY MOUTH ONCE DAILY AS DIRECTED 100 tablet 1  . hydrALAZINE (APRESOLINE) 25 MG tablet Take 1 tablet (25 mg total) by mouth 3 (three) times daily. (Patient not taking: Reported on 04/02/2016) 270 tablet 3  .  oxyCODONE-acetaminophen (PERCOCET/ROXICET) 5-325 MG tablet Take 1-2 tablets by mouth every 6 (six) hours as needed for moderate pain or severe pain. (Patient not taking: Reported on 04/02/2016) 10 tablet 0   No facility-administered medications prior to visit.      Allergies:   Lisinopril; Losartan; Nifedipine; Triamterene; Hydralazine hcl; Amoxicillin; Ampicillin; and Carvedilol   Social History   Social History  . Marital Status: Married    Spouse Name: N/A  . Number of Children: N/A  . Years of Education: N/A   Social History Main Topics  . Smoking status: Former Smoker -- 1.00 packs/day for 13 years    Quit date: 11/30/1970  . Smokeless tobacco: Never Used  . Alcohol Use: No  . Drug Use: No  . Sexual Activity: Yes   Other Topics Concern  . None   Social History Narrative     Family History:  The patient's family history includes Cancer in his sister; Diabetes in his brother and sister; Heart disease in his brother, father, and mother; Hyperlipidemia in his brother and father; Hypertension in his brother, father, and sister; Stroke in his mother. There is no history of Early death.   ROS:   Please see the history of present illness.    Review of Systems  Constitution: Positive for malaise/fatigue.   All other systems reviewed and are negative.   Physical Exam:    VS:  BP 150/60 mmHg  Pulse 70  Ht 6\' 2"  (1.88 m)  Wt 222 lb 6.4 oz (100.88 kg)  BMI 28.54 kg/m2   GEN: Well nourished, well developed, in no acute distress HEENT: normal Neck: no JVD, no masses Cardiac: Normal S1/S2, RRR; no murmurs, rubs, or gallops, no edema;     Respiratory:  clear to auscultation bilaterally; no wheezing, rhonchi or rales GI: soft, nontender, nondistended MS: no deformity or atrophy Skin: warm and dry Neuro: No focal deficits  Psych: Alert and oriented x 3, normal affect  Wt Readings from Last 3 Encounters:  04/02/16 222 lb 6.4 oz (100.88 kg)  03/05/16 227 lb (102.967 kg)    03/02/16 227 lb 1.9 oz (103.021 kg)      Studies/Labs Reviewed:     EKG:  EKG is   ordered today.  The ekg ordered today demonstrates NSR, HR 69, normal axis, RSR prime in V1, QTc 396 ms, no change from prior tracing  Recent Labs: 12/06/2015: ALT 24 12/11/2015: BUN 14; Creatinine, Ser 1.15; Hemoglobin 10.6*; Platelets 117*; Potassium 3.8; Sodium  139  [facility] ED [facility]: Labs: Hemoglobin 12.2, INR 1.95, potassium 4.7, creatinine 1.40, ALT 23.  Recent Lipid Panel    Component Value Date/Time   CHOL 146 05/13/2015 1635   TRIG 245* 05/13/2015 1635   HDL 34* 05/13/2015 1635   CHOLHDL 4.3 05/13/2015 1635   VLDL 49* 05/13/2015 1635   LDLCALC 63 05/13/2015 1635    Additional studies/ records that were reviewed today include:   AAA Duplex 2/17 Infrarenal AAA 3 x 3 cm  ABI 12/16 R 1.17; L1.07  Carotid US Q000111Q RICA 123456 LICA 123456  Myoview 9/14(At [facility]) No ischemia, diaphragmatic attenuation, EF 64%  ASSESSMENT:     1. Essential hypertension   2. Coronary artery disease involving native coronary artery of native heart without angina pectoris   3. Bilateral carotid artery disease ([place])   4. PAF (paroxysmal atrial fibrillation) ([place])   5. AAA (abdominal aortic aneurysm) without rupture (HCC)     PLAN:     In order of problems listed above:  1. HTN -  BP above target.  With his AAA, would like to keep it close to 120/80.  Hydralazine did work, but he was intol.  He has multiple intolerances to medications. He had significant fatigue on higher doses of clonidine. He has a history of angioedema with ACE inhibitor. He previously had significant elevations in blood sugar on nifedipine. He could not tol beta blockers in the past.             -  start Terazosin 1 mg QHS  -  Continue to monitor BP at home  -  Split night study pending for hx of snoring  2. CAD - s/p CABG in 2004 and low risk Myoview in 2014.  No angina. Continue aspirin, statin.   3. Carotid  stenosis - s/p R CEA. FU with [facility] as planned.  4. PAF - Maintaining sinus rhythm. Continue Coumadin. We discussed the risk of recurrent atrial fibrillation with untreated sleep apnea.   5. AAA - Followed by [facility]. HR too slow to add beta-blocker.     Medication Adjustments/Labs and Tests Ordered: Current medicines are reviewed at length with the patient today.  Concerns regarding medicines are outlined above.  Medication changes, Labs and Tests ordered today are outlined in the Patient Instructions noted below. Patient Instructions  Medication Instructions:  START TERAZOSIN 1 MG AT BEDTIME; RX SENT IN Labwork: NONE Testing/Procedures: NONE Follow-Up: DR. Burt Knack 07/03/16 @ 8 AM Any Other Special Instructions Will Be Listed Below (If Applicable). If you need a refill on your cardiac medications before your next appointment, please call your pharmacy.    Signed, Richardson Dopp, PA-C  04/02/2016 10:53 AM    [facility] [address], [place], [place]  [address] Phone: [phone]; Fax: [phone]

## 2016-04-02 NOTE — Patient Instructions (Addendum)
Medication Instructions:  START TERAZOSIN 1 MG AT BEDTIME; RX SENT IN Labwork: NONE Testing/Procedures: NONE Follow-Up: DR. Burt Knack 07/03/16 @ 8 AM Any Other Special Instructions Will Be Listed Below (If Applicable). If you need a refill on your cardiac medications before your next appointment, please call your pharmacy.

## 2016-04-13 ENCOUNTER — Ambulatory Visit: Payer: Medicare Other | Admitting: Family Medicine

## 2016-04-15 ENCOUNTER — Ambulatory Visit (INDEPENDENT_AMBULATORY_CARE_PROVIDER_SITE_OTHER): Payer: Medicare Other | Admitting: Podiatry

## 2016-04-15 ENCOUNTER — Encounter: Payer: Self-pay | Admitting: Podiatry

## 2016-04-15 ENCOUNTER — Ambulatory Visit: Payer: Medicare Other | Admitting: Podiatry

## 2016-04-15 DIAGNOSIS — M79674 Pain in right toe(s): Secondary | ICD-10-CM

## 2016-04-15 DIAGNOSIS — B351 Tinea unguium: Secondary | ICD-10-CM | POA: Diagnosis not present

## 2016-04-15 DIAGNOSIS — M79675 Pain in left toe(s): Secondary | ICD-10-CM | POA: Diagnosis not present

## 2016-04-15 DIAGNOSIS — E1140 Type 2 diabetes mellitus with diabetic neuropathy, unspecified: Secondary | ICD-10-CM

## 2016-04-15 DIAGNOSIS — L84 Corns and callosities: Secondary | ICD-10-CM

## 2016-04-15 NOTE — Progress Notes (Signed)
Patient ID: Thomas Marshall, male   DOB: [birth year], 75 y.o.   MRN: [id number]  Subjective: 75 y.o. returns the office today for painful, elongated, thickened toenails which he cannot trim himself. Denies any redness or drainage around the nails. Denies any acute changes since last appointment and no new complaints today. Denies any systemic complaints such as fevers, chills, nausea, vomiting.   Objective: AAO 3, NAD DP/PT pulses palpable, CRT less than 3 seconds Protective sensation decreased with Simms Weinstein monofilament Nails hypertrophic, dystrophic, elongated, brittle, discolored 10. There is tenderness overlying the nails 1-5 bilaterally. There is no surrounding erythema or drainage along the nail sites. Hyperkeratotic lesion right fifth metatarsal base. Upon debridement no underlying ulceration, drainage or other signs of infection. No open lesions or pre-ulcerative lesions are identified. No other areas of tenderness bilateral lower extremities. No overlying edema, erythema, increased warmth. No pain with calf compression, swelling, warmth, erythema.  Assessment: Patient presents with symptomatic onychomycosis; hyperkeratotic lesion  Plan: -Treatment options including alternatives, risks, complications were discussed -Nails sharply debrided 10 without complication/bleeding. -Hyperkeratotic lesion debrided 1 without complications or bleeding. -Discussed daily foot inspection. If there are any changes, to call the office immediately.  -Follow-up in 3 months or sooner if any problems are to arise. In the meantime, encouraged to call the office with any questions, concerns, changes symptoms.  Celesta Gentile, DPM

## 2016-04-19 ENCOUNTER — Encounter (HOSPITAL_COMMUNITY): Payer: Self-pay | Admitting: Family Medicine

## 2016-04-19 ENCOUNTER — Emergency Department (HOSPITAL_COMMUNITY): Payer: Medicare Other

## 2016-04-19 ENCOUNTER — Telehealth: Payer: Self-pay | Admitting: Physician Assistant

## 2016-04-19 ENCOUNTER — Other Ambulatory Visit: Payer: Self-pay | Admitting: Physician Assistant

## 2016-04-19 ENCOUNTER — Emergency Department (HOSPITAL_COMMUNITY)
Admission: EM | Admit: 2016-04-19 | Discharge: 2016-04-19 | Disposition: A | Discharge status: Home / Self Care | Payer: Medicare Other | Attending: Emergency Medicine | Admitting: Emergency Medicine

## 2016-04-19 DIAGNOSIS — Z951 Presence of aortocoronary bypass graft: Secondary | ICD-10-CM | POA: Insufficient documentation

## 2016-04-19 DIAGNOSIS — R5383 Other fatigue: Secondary | ICD-10-CM | POA: Diagnosis not present

## 2016-04-19 DIAGNOSIS — I10 Essential (primary) hypertension: Secondary | ICD-10-CM | POA: Diagnosis not present

## 2016-04-19 DIAGNOSIS — Z7984 Long term (current) use of oral hypoglycemic drugs: Secondary | ICD-10-CM | POA: Insufficient documentation

## 2016-04-19 DIAGNOSIS — I2510 Atherosclerotic heart disease of native coronary artery without angina pectoris: Secondary | ICD-10-CM | POA: Diagnosis not present

## 2016-04-19 DIAGNOSIS — R7989 Other specified abnormal findings of blood chemistry: Secondary | ICD-10-CM

## 2016-04-19 DIAGNOSIS — Z79899 Other long term (current) drug therapy: Secondary | ICD-10-CM | POA: Insufficient documentation

## 2016-04-19 DIAGNOSIS — R000 Tachycardia, unspecified: Secondary | ICD-10-CM | POA: Diagnosis present

## 2016-04-19 DIAGNOSIS — Z7982 Long term (current) use of aspirin: Secondary | ICD-10-CM | POA: Diagnosis not present

## 2016-04-19 DIAGNOSIS — N179 Acute kidney failure, unspecified: Secondary | ICD-10-CM

## 2016-04-19 DIAGNOSIS — Z87891 Personal history of nicotine dependence: Secondary | ICD-10-CM | POA: Insufficient documentation

## 2016-04-19 DIAGNOSIS — E119 Type 2 diabetes mellitus without complications: Secondary | ICD-10-CM | POA: Diagnosis not present

## 2016-04-19 DIAGNOSIS — Z8701 Personal history of pneumonia (recurrent): Secondary | ICD-10-CM | POA: Diagnosis not present

## 2016-04-19 DIAGNOSIS — E785 Hyperlipidemia, unspecified: Secondary | ICD-10-CM | POA: Diagnosis not present

## 2016-04-19 DIAGNOSIS — I4891 Unspecified atrial fibrillation: Secondary | ICD-10-CM | POA: Insufficient documentation

## 2016-04-19 DIAGNOSIS — K219 Gastro-esophageal reflux disease without esophagitis: Secondary | ICD-10-CM | POA: Insufficient documentation

## 2016-04-19 DIAGNOSIS — M1990 Unspecified osteoarthritis, unspecified site: Secondary | ICD-10-CM | POA: Insufficient documentation

## 2016-04-19 DIAGNOSIS — Z7901 Long term (current) use of anticoagulants: Secondary | ICD-10-CM | POA: Insufficient documentation

## 2016-04-19 DIAGNOSIS — Z85828 Personal history of other malignant neoplasm of skin: Secondary | ICD-10-CM | POA: Diagnosis not present

## 2016-04-19 DIAGNOSIS — Z880 Allergy status to penicillin: Secondary | ICD-10-CM | POA: Insufficient documentation

## 2016-04-19 LAB — PROTIME-INR
INR: 2.19 — AB (ref 0.00–1.49)
PROTHROMBIN TIME: 24.1 s — AB (ref 11.6–15.2)

## 2016-04-19 LAB — BASIC METABOLIC PANEL
Anion gap: 9 (ref 5–15)
BUN: 21 mg/dL — AB (ref 6–20)
CHLORIDE: 106 mmol/L (ref 101–111)
CO2: 24 mmol/L (ref 22–32)
CREATININE: 1.7 mg/dL — AB (ref 0.61–1.24)
Calcium: 9.1 mg/dL (ref 8.9–10.3)
GFR calc Af Amer: 43 mL/min — ABNORMAL LOW (ref >60)
GFR calc non Af Amer: 37 mL/min — ABNORMAL LOW (ref >60)
Glucose, Bld: 178 mg/dL — ABNORMAL HIGH (ref 65–99)
POTASSIUM: 4.1 mmol/L (ref 3.5–5.1)
Sodium: 139 mmol/L (ref 135–145)

## 2016-04-19 LAB — CBC
HEMATOCRIT: 38.4 % — AB (ref 39.0–52.0)
Hemoglobin: 12.3 g/dL — ABNORMAL LOW (ref 13.0–17.0)
MCH: 29.1 pg (ref 26.0–34.0)
MCHC: 32 g/dL (ref 30.0–36.0)
MCV: 91 fL (ref 78.0–100.0)
PLATELETS: 153 10*3/uL (ref 150–400)
RBC: 4.22 MIL/uL (ref 4.22–5.81)
RDW: 14 % (ref 11.5–15.5)
WBC: 4.9 10*3/uL (ref 4.0–10.5)

## 2016-04-19 LAB — I-STAT TROPONIN, ED: Troponin i, poc: 0.02 ng/mL (ref 0.00–0.08)

## 2016-04-19 IMAGING — CR DG CHEST 2V
2 series · 2 of 2 positions shown · non-contrast
Comparison: [DATE].

CLINICAL DATA: 74-year-old presenting with 2 day history of severe
fatigue and dysrhythmia. Former smoker. Prior CABG.

EXAM:
CHEST  2 VIEW

[chest pa]
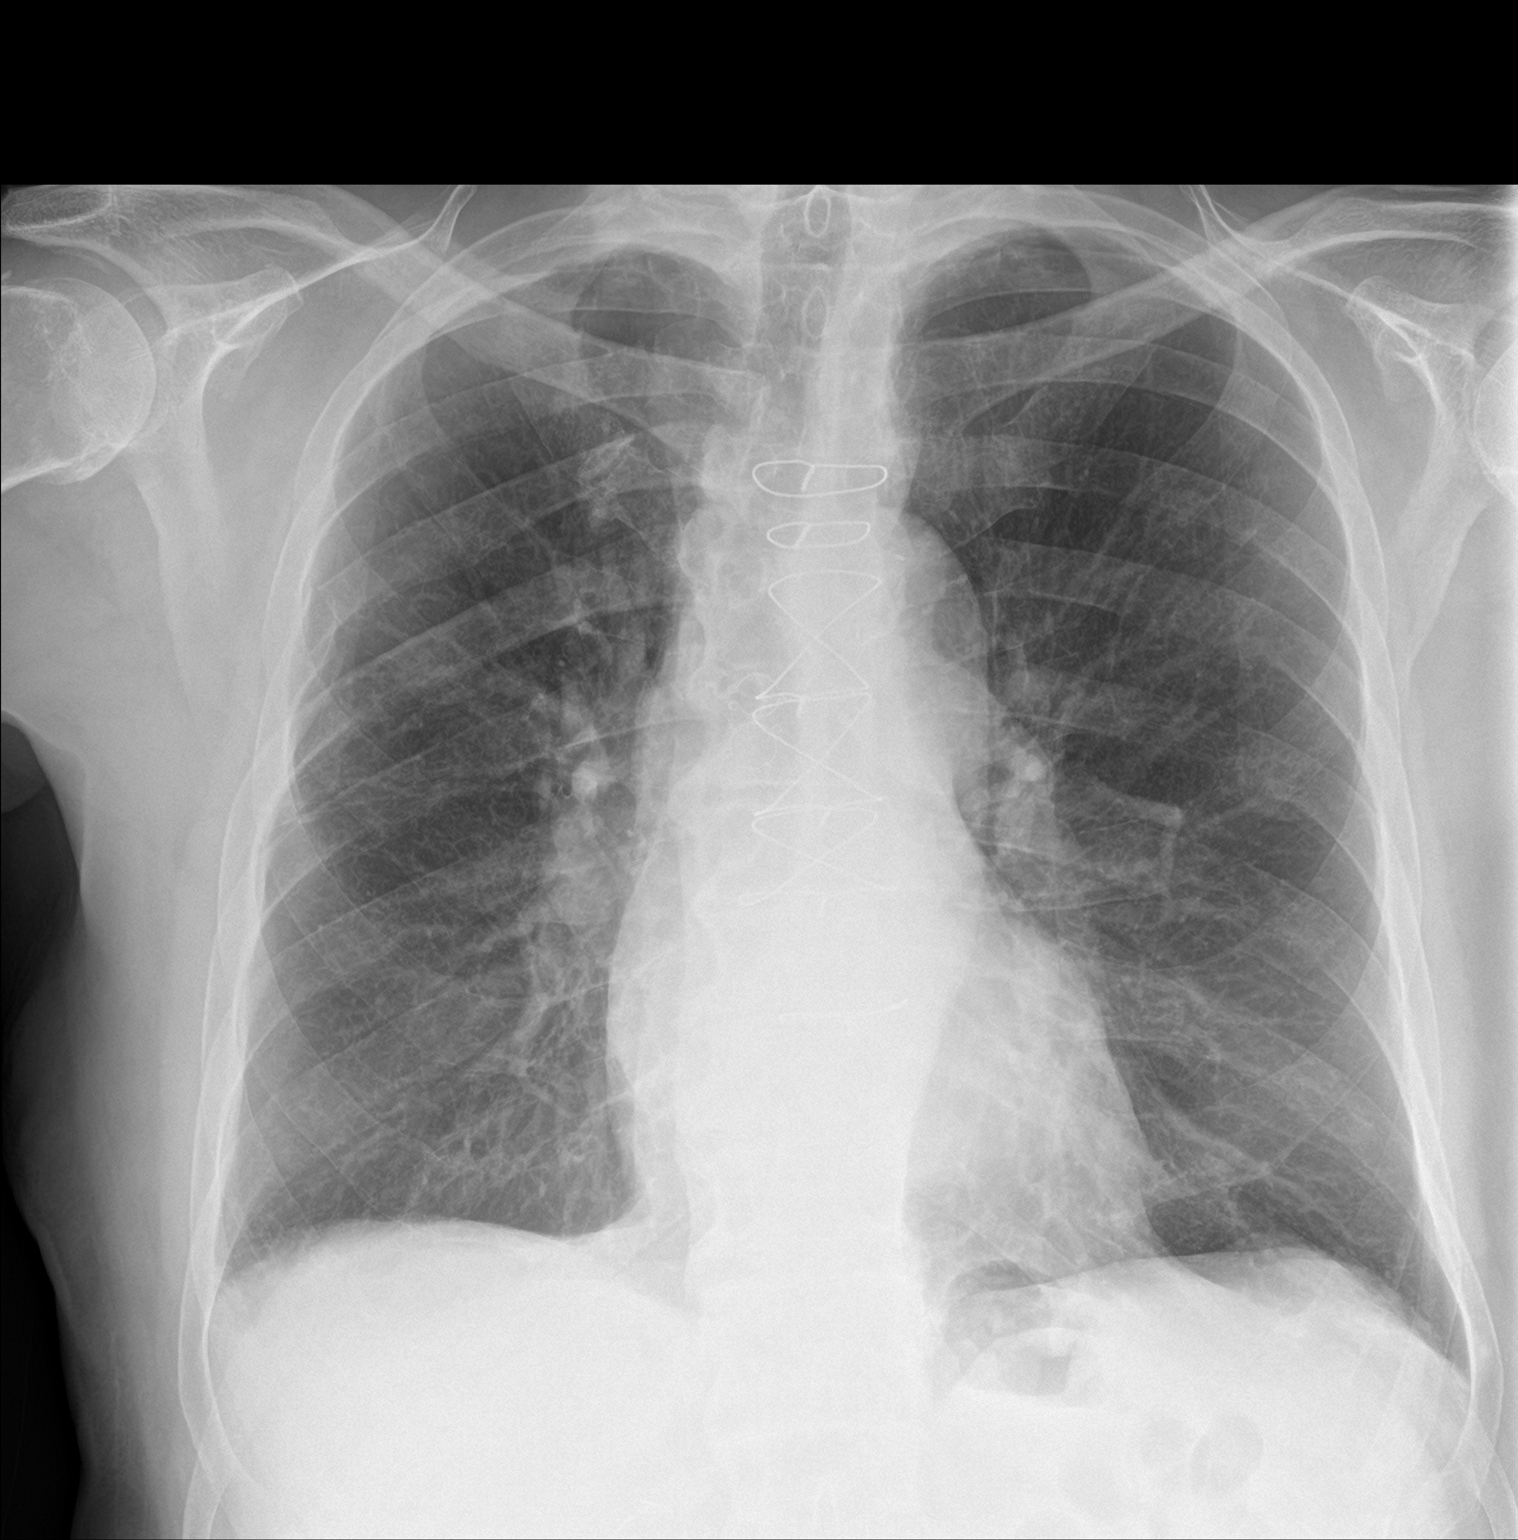

[chest lat]
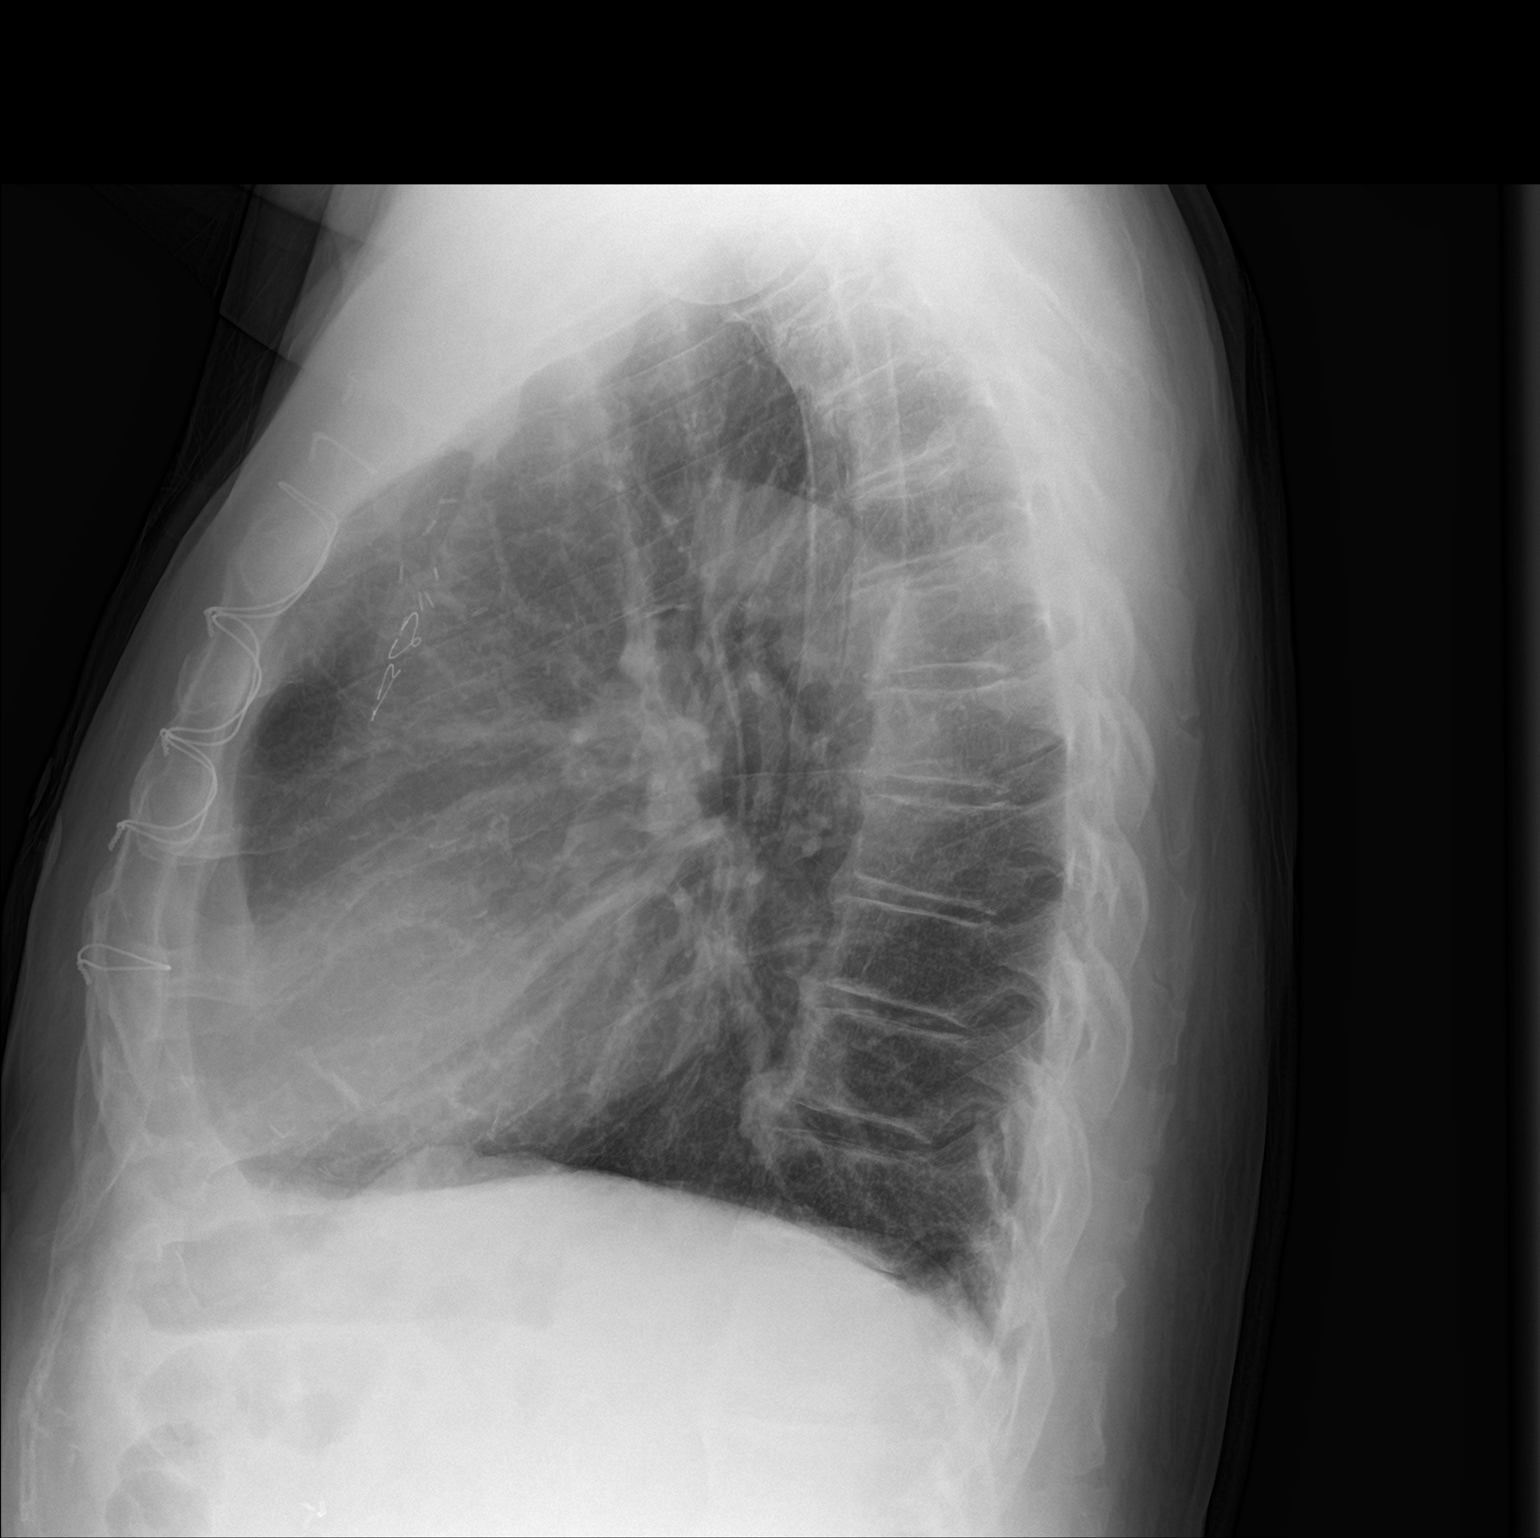

[2 of 2 positions shown; findings below may reference images not displayed]

FINDINGS: Prior sternotomy for CABG. Cardiac silhouette normal in size,
unchanged. Thoracic aorta mildly tortuous and atherosclerotic,
unchanged. Hilar and mediastinal contours otherwise unremarkable.
Scarring in scratch the pleuroparenchymal scarring in the right
upper lobe at the site of old, healed right rib fractures.
Pleuroparenchymal scarring at the right base with blunting of the
costophrenic angle, unchanged. Mildly prominent bronchovascular
markings diffusely and mild central peribronchial thickening,
unchanged. No new pulmonary parenchymal abnormalities. Degenerative
changes throughout the thoracic spine. No significant interval
change.
IMPRESSION: Stable mild changes of chronic bronchitis and/or asthma. No acute
cardiopulmonary disease. Scattered areas of pleuroparenchymal
scarring on the right.

## 2016-04-19 MED ORDER — PROPOFOL 10 MG/ML IV BOLUS
1.0000 mg/kg | Freq: Once | INTRAVENOUS | Status: DC
  Filled 2016-04-19: qty 20

## 2016-04-19 MED ORDER — SODIUM CHLORIDE 0.9 % IV BOLUS (SEPSIS)
1000.0000 mL | Freq: Once | INTRAVENOUS | Status: AC
  Administered 2016-04-19: 1000 mL via INTRAVENOUS

## 2016-04-19 MED ORDER — PROPOFOL 10 MG/ML IV BOLUS
INTRAVENOUS | Status: AC | PRN
  Administered 2016-04-19: 50 mg via INTRAVENOUS

## 2016-04-19 NOTE — Progress Notes (Signed)
Patient called to report feeling fatigued, looking pale and with a "weird feeling in chest" for three days.  The symptoms got worse last night.  He is at [employer] visiting his wife.  Check CBC and BMET today while at [facility]  Will see if scheduling can get him on the [facility] clinic schedule tomorrow.   Tarri Fuller [facility]

## 2016-04-19 NOTE — Progress Notes (Signed)
Discussed patient with ER attending Dr Regenia Skeeter. Patient presents with symptomatic afib, AKI. From chart review history of PAF and appears he had maintained NSR for quite some time. EKG in ER shows afib with elevated rates, patient is symptomatic. INRs reviewed and have been therapeutic well over a month, I have recommended cardioversion in ER. If persistent afib or ongoing symptoms please contact us back to consider admission.   Zandra Abts MD

## 2016-04-19 NOTE — Telephone Encounter (Signed)
The patient called earlier today to report onset of fatigue over the last 3 days and worsening last night. He he also reports a vague, weird sensation in his chest that he would not characterize as pain. He's also been more pale than usual. He happened to be visiting his wife on [address] at [facility] he is on Coumadin for PAF so I had a CBC and basic metabolic panel drawn. His hemoglobin was slightly low however, it would not explain his severe fatigue. He was barely able to walk from his wife's room to the elevator  which was only 75 feet away. His serum creatinine is elevated at 1.7 compared to 1.15 four months ago.  Glucose is 178.  His heart rate is irregular on exam.  However, on his last EKG he was in sinus rhythm with very frequent PACs.  Certainty could be back in atrial fibrillation. Perhaps the elevated creatinine is related to dehydration.  He denies any fever or chills recently and white count was normal.  Given the severity of the symptoms, I recommended he go down to the emergency room to be evaluated.  He does not have a documented echocardiogram in his chart and does have a history of coronary artery disease with CABG in 2004.    Tarri Fuller PA-C

## 2016-04-19 NOTE — Sedation Documentation (Signed)
Patient denies pain and is resting comfortably.  

## 2016-04-19 NOTE — ED Notes (Signed)
Pt sts that since yesterday his heart has been out of rhythm and elevated. Sent here by doctor also for abnormal labs. Denies chest pain. sts some SOB with exertion

## 2016-04-19 NOTE — Sedation Documentation (Signed)
Vital signs stable. 

## 2016-04-19 NOTE — ED Provider Notes (Signed)
CSN: [id number]     Arrival date & time 04/19/16  1121 History   First MD Initiated Contact with Patient 04/19/16 1150     Chief Complaint  Patient presents with  . Tachycardia     (Consider location/radiation/quality/duration/timing/severity/associated sxs/prior Treatment) HPI  75 year old male presents from upstairs while visiting his wife. His chief complaint weakness and fatigue.This has been ongoing for about one week, worse last night and today. He feels a funny sensation in his chest but does not specifically feel chest pain. No pressure. It does, feel like he can feel his heart beating out of rhythm. He has been in A. fib but not for the last several years. At that time he was shocked out of it and put on amiodarone. Patient states that he has had some exertional shortness of breath over this last week. He was seeing his wife was in the hospital today and was evaluated by his cardiology PA, Tenny Craw, who sent lab work that showed an elevated creatinine of 1.7 and sent him down here for ER evaluation. No leg swelling or leg pain. Patient has had 2 loose stools per day for the last couple days.  Past Medical History  Diagnosis Date  . Cancer (HCC)     skin & squamous cell  . Diabetes mellitus without complication ([place])   . GERD (gastroesophageal reflux disease)   . Hyperlipidemia   . Hypertension   . Coronary artery disease   . PAF (paroxysmal atrial fibrillation) ([place])   . Dysrhythmia   . Pneumonia   . Arthritis   . AAA (abdominal aortic aneurysm) [facility])    Past Surgical History  Procedure Laterality Date  . Exploration post operative open heart    . Urinary surgery      scar tissue  . Ankle surgery      right fused  . Knee surgery      replacement on both knees  . Back surgery      low back   . Finger surgery      skin graft on rt index  . Coronary artery bypass graft  2004    [facility]  . Tonsillectomy    . Colonoscopy    . Hernia repair Right     RIH  .  Cholecystectomy N/A 12/07/2013    Procedure: LAPAROSCOPIC CHOLECYSTECTOMY ;  Surgeon: [name], MD;  Location: [place];  Service: General;  Laterality: N/A;  . Joint replacement    . Prostate surgery    . Endarterectomy Right 12/10/2015    Procedure: Right Carotid ENDARTERECTOMY;  Surgeon: Conrad Laura, MD;  Location: [place];  Service: Vascular;  Laterality: Right;  . Finger surgery Right     right index finger.   Family History  Problem Relation Age of Onset  . Stroke Mother   . Heart disease Mother   . Heart disease Father   . Hypertension Father   . Hyperlipidemia Father   . Cancer Sister     breast  and skin  . Diabetes Sister   . Hypertension Sister   . Diabetes Brother   . Heart disease Brother   . Hyperlipidemia Brother   . Hypertension Brother   . Early death Neg Hx    Social History  Substance Use Topics  . Smoking status: Former Smoker -- 1.00 packs/day for 13 years    Quit date: 11/30/1970  . Smokeless tobacco: Never Used  . Alcohol Use: No    Review of Systems  Constitutional: Positive for fatigue.  Respiratory: Positive for shortness of breath (with exertion). Negative for cough.   Cardiovascular: Negative for chest pain and leg swelling.       "a funny feeling in my chest"  Gastrointestinal: Negative for nausea, vomiting and abdominal pain.  All other systems reviewed and are negative.     Allergies  Lisinopril; Losartan; Nifedipine; Triamterene; Hydralazine hcl; Amoxicillin; Ampicillin; and Carvedilol  Home Medications   Prior to Admission medications   Medication Sig Start Date End Date Taking? Authorizing Provider  aspirin 81 MG tablet Take 81 mg by mouth daily.   Yes Historical Provider, MD  atorvastatin (LIPITOR) 40 MG tablet Take 40 mg by mouth daily.   Yes Historical Provider, MD  cholecalciferol (VITAMIN D) 1000 UNITS tablet Take 1,000 Units by mouth daily.   Yes Historical Provider, MD  cloNIDine (CATAPRES) 0.1 MG tablet TAKE ONE TABLET  BY MOUTH TWICE DAILY 01/01/16  Yes [name], MD  metFORMIN (GLUCOPHAGE) 1000 MG tablet Take 1 tablet (1,000 mg total) by mouth 2 (two) times daily with a meal. 01/21/16  Yes Virginia Crews, MD  omeprazole (PRILOSEC) 20 MG capsule Take 20 mg by mouth daily.   Yes Historical Provider, MD  spironolactone (ALDACTONE) 25 MG tablet TAKE ONE TABLET BY MOUTH ONCE DAILY 10/23/15  Yes [name], MD  terazosin (HYTRIN) 1 MG capsule Take 1 capsule (1 mg total) by mouth at bedtime. 04/02/16  Yes Liliane Shi, PA-C  vitamin C (ASCORBIC ACID) 500 MG tablet Take 500 mg by mouth daily.   Yes Historical Provider, MD  warfarin (COUMADIN) 10 MG tablet TAKE ONE TABLET BY MOUTH ONCE DAILY AS DIRECTED 03/11/16  Yes [name], MD  glucose blood test strip Use to test blood glucose level 2 times a day. 01/24/16   Virginia Crews, MD  nitroGLYCERIN (NITROSTAT) 0.4 MG SL tablet Place 1 tablet (0.4 mg total) under the tongue every 5 (five) minutes as needed for chest pain. 04/25/14   [name], MD   BP 133/82 mmHg  Pulse 114  Temp(Src) 98.6 F (37 C) (Oral)  Resp 15  SpO2 98% Physical Exam  Constitutional: He is oriented to person, place, and time. He appears well-developed and well-nourished. No distress.  HENT:  Head: Normocephalic and atraumatic.  Right Ear: External ear normal.  Left Ear: External ear normal.  Nose: Nose normal.  Eyes: Right eye exhibits no discharge. Left eye exhibits no discharge.  Neck: Neck supple.  Cardiovascular: Normal heart sounds and intact distal pulses.  An irregularly irregular rhythm present. Tachycardia present.   Pulmonary/Chest: Effort normal and breath sounds normal.  Abdominal: Soft. He exhibits no distension. There is no tenderness.  Musculoskeletal: He exhibits no edema.  Neurological: He is alert and oriented to person, place, and time.  Skin: Skin is warm and dry. He is not diaphoretic.  Nursing note and vitals reviewed.   ED Course   .Sedation Date/Time: 04/19/2016 1:53 PM Performed by: Sherwood Gambler Authorized by: Sherwood Gambler  Consent:    Consent obtained:  Verbal and written   Consent given by:  Patient   Risks discussed:  Allergic reaction, inadequate sedation, prolonged hypoxia resulting in organ damage, prolonged sedation necessitating reversal, respiratory compromise necessitating ventilatory assistance and intubation and vomiting   Alternatives discussed:  Analgesia without sedation Universal protocol:    Procedure explained and questions answered to patient or proxy's satisfaction: yes     Relevant documents present and verified: yes  Test results available and properly labeled: yes     Imaging studies available: yes     Required blood products, implants, devices, and special equipment available: yes     Site/side marked: yes     Immediately prior to procedure a time out was called: yes     Patient identity confirmation method:  Arm band and verbally with patient Indications:    Sedation purpose:  Cardioversion   Procedure necessitating sedation performed by:  Physician performing sedation   Intended level of sedation:  Moderate (conscious sedation) Pre-sedation assessment:    ASA classification: class 2 - patient with mild systemic disease     Neck mobility: normal     Mouth opening:  3 or more finger widths   Mallampati score:  II - soft palate, uvula, fauces visible Immediate pre-procedure details:    Reassessment: Patient reassessed immediately prior to procedure     Reviewed: vital signs and relevant labs/tests     Verified: bag valve mask available, emergency equipment available, intubation equipment available, IV patency confirmed, oxygen available and suction available   Procedure details (see MAR for exact dosages):    Preoxygenation:  Nasal cannula   Sedation:  Propofol   Intra-procedure monitoring:  Blood pressure monitoring, cardiac monitor, continuous capnometry, continuous pulse  oximetry, frequent LOC assessments and frequent vital sign checks   Intra-procedure events: none   Post-procedure details:    Attendance: Constant attendance by certified staff until patient recovered     Recovery: Patient returned to pre-procedure baseline     Patient tolerance:  Tolerated well, no immediate complications .Cardioversion Date/Time: 04/19/2016 1:55 PM Performed by: Sherwood Gambler Authorized by: Sherwood Gambler Consent: Verbal consent obtained. Written consent obtained. Risks and benefits: risks, benefits and alternatives were discussed Consent given by: patient Time out: Immediately prior to procedure a "time out" was called to verify the correct patient, procedure, equipment, support staff and site/side marked as required. Patient sedated: yes Sedatives: propofol Cardioversion basis: emergent Pre-procedure rhythm: atrial fibrillation Patient position: patient was placed in a supine position Chest area: chest area exposed Electrodes: pads Electrodes placed: anterior-posterior Number of attempts: 2 Attempt 1 mode: synchronous Attempt 1 waveform: biphasic Attempt 1 shock (in Joules): 200 Attempt 1 outcome: no change in rhythm Attempt 2 mode: synchronous Attempt 2 waveform: biphasic Attempt 2 shock (in Joules): 200 Attempt 2 outcome: conversion to normal sinus rhythm Post-procedure rhythm: normal sinus rhythm Complications: no complications Patient tolerance: Patient tolerated the procedure well with no immediate complications   (including critical care time) Labs Review Labs Reviewed  PROTIME-INR - Abnormal; Notable for the following:    Prothrombin Time 24.1 (*)    INR 2.19 (*)    All other components within normal limits  I-STAT TROPOININ, ED     Imaging Review Dg Chest 2 View  04/19/2016  CLINICAL DATA:  75 year old presenting with 2 day history of severe fatigue and dysrhythmia. Former smoker. Prior CABG. EXAM: CHEST  2 VIEW COMPARISON:  12/04/2013.  FINDINGS: Prior sternotomy for CABG. Cardiac silhouette normal in size, unchanged. Thoracic aorta mildly tortuous and atherosclerotic, unchanged. Hilar and mediastinal contours otherwise unremarkable. Scarring in scratch the pleuroparenchymal scarring in the right upper lobe at the site of old, healed right rib fractures. Pleuroparenchymal scarring at the right base with blunting of the costophrenic angle, unchanged. Mildly prominent bronchovascular markings diffusely and mild central peribronchial thickening, unchanged. No new pulmonary parenchymal abnormalities. Degenerative changes throughout the thoracic spine. No significant interval change. IMPRESSION: Stable mild changes  of chronic bronchitis and/or asthma. No acute cardiopulmonary disease. Scattered areas of pleuroparenchymal scarring on the right. Electronically Signed   By: Evangeline Dakin M.D.   On: 04/19/2016 13:09   I have personally reviewed and evaluated these images and lab results as part of my medical decision-making.   EKG Interpretation   Date/Time:  Sunday Apr 19 2016 11:54:10 EDT Ventricular Rate:  122 PR Interval:    QRS Duration: 88 QT Interval:  319 QTC Calculation: [id number] Axis:   46 Text Interpretation:  Atrial fibrillation Abnormal R-wave progression,  early transition Baseline wander in lead(s) V2 afib new compared to Feb  2017 Confirmed by Tnia Anglada MD, [place] [id number]) on 04/19/2016 11:55:58 AM       EKG Interpretation  Date/Time:  Sunday Apr 19 2016 13:49:20 EDT Ventricular Rate:  59 PR Interval:  60 QRS Duration: 98 QT Interval:  365 QTC Calculation: 361 R Axis:   35 Text Interpretation:  Sinus rhythm Atrial premature complexes in couplets Short PR interval Abnormal R-wave progression, early transition afib now resolved Confirmed by Shaunte Weissinger MD, Jobin Montelongo [id number]) on 04/19/2016 2:03:42 PM         MDM   Final diagnoses:  Atrial fibrillation with RVR ([place])    I believe patient's symptoms are coming from  afib. Symptoms have been going on for about 1 week but his INR is therapeutic and recent review shows it is consistently therapeutic. Discussed with Dr. Harl Bowie of cards who recommends cardioversion. Patient agreeable. Discussed risks/benefitis of sedation and cardioversion. Converted to sinus rhythm and remained in this. Observed for about 1 hour and still doing well. Mild AKI, given IV fluids. He feels well enough to go home and has close f/u as outpatient and thus I think he can get his creatinine rechecked as an outpatient. Discussed return precautions. Will remain on warfarin, his CHADVASC is 5.   Sherwood Gambler, MD 04/19/16 1729

## 2016-04-19 NOTE — ED Notes (Signed)
Pt ambulates independently and with steady gait at time of discharge. Discharge instructions and follow up information reviewed with patient. No other questions or concerns voiced at this time.  

## 2016-04-19 NOTE — Sedation Documentation (Signed)
Family at bedside. 

## 2016-04-19 NOTE — Sedation Documentation (Addendum)
Patient shocked at 200 joules synchronized, er md at bedside, patient tolerated well

## 2016-04-19 NOTE — Sedation Documentation (Addendum)
Patient remains in atrial fib, shocked at 200 joules synchronized, er md at bedside, patient tolerated well

## 2016-04-20 ENCOUNTER — Telehealth: Payer: Self-pay | Admitting: General Practice

## 2016-04-20 NOTE — Telephone Encounter (Signed)
Not sure why this was sent to the refill dept.

## 2016-04-20 NOTE — Telephone Encounter (Signed)
Per pt call:   Per notes in Epic by Hagar pt needs to be seen today.  Not authorized to schedule.  Pt has a Coumadin appt 12:45a today.  Pt is waiting for a call back.

## 2016-04-20 NOTE — Telephone Encounter (Signed)
DISCUSSED WITH ANTHONY  APPEARS  BRYAN  CALLED WITH  3  DIFFERENT  MESSAGES  THE  LAST  STATING PT  NEED  APPT  Friday  WITH  FLEX  PA .ANTHONY WILL SCHEDULE AS  INSTRUCTED .Adonis Housekeeper

## 2016-04-21 ENCOUNTER — Encounter: Payer: Self-pay | Admitting: Physician Assistant

## 2016-04-21 DIAGNOSIS — I10 Essential (primary) hypertension: Secondary | ICD-10-CM | POA: Insufficient documentation

## 2016-04-22 ENCOUNTER — Ambulatory Visit (INDEPENDENT_AMBULATORY_CARE_PROVIDER_SITE_OTHER): Payer: Medicare Other | Admitting: Surgery

## 2016-04-22 ENCOUNTER — Other Ambulatory Visit (INDEPENDENT_AMBULATORY_CARE_PROVIDER_SITE_OTHER): Payer: Medicare Other | Admitting: *Deleted

## 2016-04-22 DIAGNOSIS — N179 Acute kidney failure, unspecified: Secondary | ICD-10-CM | POA: Diagnosis not present

## 2016-04-22 DIAGNOSIS — Z7901 Long term (current) use of anticoagulants: Secondary | ICD-10-CM

## 2016-04-22 DIAGNOSIS — I4891 Unspecified atrial fibrillation: Secondary | ICD-10-CM | POA: Diagnosis not present

## 2016-04-22 DIAGNOSIS — I480 Paroxysmal atrial fibrillation: Secondary | ICD-10-CM | POA: Diagnosis not present

## 2016-04-22 LAB — BASIC METABOLIC PANEL
BUN: 16 mg/dL (ref 7–25)
CALCIUM: 8.1 mg/dL — AB (ref 8.6–10.3)
CO2: 22 mmol/L (ref 20–31)
CREATININE: 1.32 mg/dL — AB (ref 0.70–1.18)
Chloride: 105 mmol/L (ref 98–110)
Glucose, Bld: 217 mg/dL — ABNORMAL HIGH (ref 65–99)
Potassium: 4.5 mmol/L (ref 3.5–5.3)
Sodium: 139 mmol/L (ref 135–146)

## 2016-04-22 LAB — POCT INR: INR: 2.3

## 2016-04-24 ENCOUNTER — Telehealth: Payer: Self-pay | Admitting: *Deleted

## 2016-04-24 ENCOUNTER — Encounter: Payer: Self-pay | Admitting: Physician Assistant

## 2016-04-24 ENCOUNTER — Ambulatory Visit (INDEPENDENT_AMBULATORY_CARE_PROVIDER_SITE_OTHER): Payer: Medicare Other | Admitting: Physician Assistant

## 2016-04-24 VITALS — BP 142/68 | HR 60 | Ht 74.0 in | Wt 225.6 lb

## 2016-04-24 DIAGNOSIS — I2510 Atherosclerotic heart disease of native coronary artery without angina pectoris: Secondary | ICD-10-CM | POA: Diagnosis not present

## 2016-04-24 DIAGNOSIS — I480 Paroxysmal atrial fibrillation: Secondary | ICD-10-CM | POA: Diagnosis not present

## 2016-04-24 DIAGNOSIS — R790 Abnormal level of blood mineral: Secondary | ICD-10-CM

## 2016-04-24 DIAGNOSIS — I10 Essential (primary) hypertension: Secondary | ICD-10-CM

## 2016-04-24 DIAGNOSIS — N179 Acute kidney failure, unspecified: Secondary | ICD-10-CM

## 2016-04-24 DIAGNOSIS — N189 Chronic kidney disease, unspecified: Secondary | ICD-10-CM

## 2016-04-24 DIAGNOSIS — I4891 Unspecified atrial fibrillation: Secondary | ICD-10-CM | POA: Diagnosis not present

## 2016-04-24 DIAGNOSIS — Z79899 Other long term (current) drug therapy: Secondary | ICD-10-CM

## 2016-04-24 LAB — TSH: TSH: 2.39 mIU/L (ref 0.40–4.50)

## 2016-04-24 LAB — T4, FREE: FREE T4: 1.3 ng/dL (ref 0.8–1.8)

## 2016-04-24 LAB — MAGNESIUM: MAGNESIUM: 1.2 mg/dL — AB (ref 1.5–2.5)

## 2016-04-24 MED ORDER — MAGNESIUM OXIDE 400 MG PO TABS: 400.0000 mg | ORAL_TABLET | Freq: Two times a day (BID) | ORAL | Status: DC

## 2016-04-24 MED ORDER — SPIRONOLACTONE 25 MG PO TABS: 25.0000 mg | ORAL_TABLET | Freq: Every day | ORAL | Status: DC

## 2016-04-24 NOTE — Telephone Encounter (Signed)
-----   Message from Charlie Pitter, [place] sent at 04/24/2016  4:59 PM EDT ----- Please call patient. Magnesium is quite low. I discussed with Dr. Radford Pax regarding level of repletion - would advise he start MagOx 400mg  BID with recheck of magnesium on Tuesday. Please increase dietary intake of healthy sources of magnesium including leafy greens, nuts, seeds, fish, beans, whole grains, avocados, yogurt, and bananas.  Dayna Dunn PA-C

## 2016-04-24 NOTE — Telephone Encounter (Signed)
Informed pt of lab results. Pt verbalized understanding. Scheduled pt for repeat labs on 5/30.

## 2016-04-24 NOTE — Patient Instructions (Addendum)
Medication Instructions:  Your physician recommends that you continue on your current medications as directed. Please refer to the Current Medication list given to you today.  Labwork: Today: Magnesium, TSH & free T4  Testing/Procedures: Your physician has requested that you have an echocardiogram. Echocardiography is a painless test that uses sound waves to create images of your heart. It provides your doctor with information about the size and shape of your heart and how well your heart's chambers and valves are working. This procedure takes approximately one hour. There are no restrictions for this procedure.  Follow-Up: Keep your currently scheduled appointment with Dr. Burt Knack in August.  If you need a refill on your cardiac medications before your next appointment, please call your pharmacy.  Thank you for choosing [facility]!!

## 2016-04-24 NOTE — Progress Notes (Addendum)
Cardiology Office Note    Date:  04/24/2016  ID:  Thomas Marshall, Thomas Marshall 1941/03/18, MRN [id number] PCP:  Lavon Paganini, MD  Cardiologist:  Dr. Burt Knack   Chief Complaint: f/u atrial fibrillation  History of Present Illness:  Thomas Marshall is a 75 y.o. male with history of CAD status post CABG in 2004 (normal nuc 2014), HTN (multiple med intolerances), DM, HL, PAF (on Coumadin, previously on amiodarone post-CABG), bradycardia (prior h/o bradycardia with metoprolol and carvedilol), carotid stenosis s/p R CEA 12/2015, small AAA (followed by [facility]), renal insufficiency (probably CKD III) who presents for post-ER f/u. He recently saw Richardson Dopp PA-C earlier this month for fatigue and BP was elevated. Sleep study is planned for June. In the interim he was seen in the ER 04/19/16 with symptomatic atrial fibrillation and AKI. He had noticed significant fatigue for several days which led to the visit. Given persistently therapeutic INRs, DCCV was recommended. He converted to NSR without complication and was treated with IV fluids. His fatigue significantly improved with DCCV. Per review of chart, prior Cr has mostly tended to range in the 1.3-1.4 range - in the ED on 5/21 it was 1.7. Troponin was negative, Hgb 12.3. CXR with stable chronic changes.   He comes back in today for follow-up feeling well. Denies recurrent fatigue. No chest pain, SOB, or awareness of palpitations. He has not had any cardiac symptoms with exertion. Reports spotty BRBPR once but no overt bleeding. Repeat BMET 04/22/16 showed Cr back near baseline at 1.32. He reports home BPs have been running [id number] systolic.   Past Medical History  Diagnosis Date  . Cancer (HCC)     skin & squamous cell  . Diabetes mellitus without complication ([place])   . GERD (gastroesophageal reflux disease)   . Hyperlipidemia   . Hypertension   . Coronary artery disease     a. s/p CABG 2004.  [name] PAF (paroxysmal atrial fibrillation) ([place])   . Pneumonia   .  Arthritis   . AAA (abdominal aortic aneurysm) ([place])   . Medication intolerance Multiple   . Carotid artery disease ([place])     a. s/p R CEA 12/2015.  [name] CKD (chronic kidney disease), stage III     a. per historical labs.  . Sinus bradycardia     a. baseline HR 50s-60s, also h/o bradycardia on metoprolol and carvedilol    Past Surgical History  Procedure Laterality Date  . Exploration post operative open heart    . Urinary surgery      scar tissue  . Ankle surgery      right fused  . Knee surgery      replacement on both knees  . Back surgery      low back   . Finger surgery      skin graft on rt index  . Coronary artery bypass graft  2004    [place]  . Tonsillectomy    . Colonoscopy    . Hernia repair Right     RIH  . Cholecystectomy N/A 12/07/2013    Procedure: LAPAROSCOPIC CHOLECYSTECTOMY ;  Surgeon: [name], MD;  Location: [place];  Service: General;  Laterality: N/A;  . Joint replacement    . Prostate surgery    . Endarterectomy Right 12/10/2015    Procedure: Right Carotid ENDARTERECTOMY;  Surgeon: Conrad Waldo, MD;  Location: [place];  Service: Vascular;  Laterality: Right;  . Finger surgery Right     right index  finger.    Current Medications: Outpatient Prescriptions Prior to Visit  Medication Sig Dispense Refill  . aspirin 81 MG tablet Take 81 mg by mouth daily.    [name] atorvastatin (LIPITOR) 40 MG tablet Take 40 mg by mouth daily.    . cholecalciferol (VITAMIN D) 1000 UNITS tablet Take 1,000 Units by mouth daily.    . cloNIDine (CATAPRES) 0.1 MG tablet TAKE ONE TABLET BY MOUTH TWICE DAILY 180 tablet 2  . glucose blood test strip Use to test blood glucose level 2 times a day. 100 each 12  . metFORMIN (GLUCOPHAGE) 1000 MG tablet Take 1 tablet (1,000 mg total) by mouth 2 (two) times daily with a meal. 180 tablet 3  . omeprazole (PRILOSEC) 20 MG capsule Take 20 mg by mouth daily.    [name] terazosin (HYTRIN) 1 MG capsule Take 1 capsule (1 mg total) by mouth at  bedtime. 30 capsule 11  . vitamin C (ASCORBIC ACID) 500 MG tablet Take 500 mg by mouth daily.    [name] warfarin (COUMADIN) 10 MG tablet TAKE ONE TABLET BY MOUTH ONCE DAILY AS DIRECTED 100 tablet 1  . spironolactone (ALDACTONE) 25 MG tablet TAKE ONE TABLET BY MOUTH ONCE DAILY 90 tablet 1  . nitroGLYCERIN (NITROSTAT) 0.4 MG SL tablet Place 1 tablet (0.4 mg total) under the tongue every 5 (five) minutes as needed for chest pain. 25 tablet 3   No facility-administered medications prior to visit.     Allergies:   Lisinopril; Losartan; Nifedipine; Triamterene; Hydralazine hcl; Amoxicillin; Ampicillin; and Carvedilol   Social History   Social History  . Marital Status: Married    Spouse Name: N/A  . Number of Children: N/A  . Years of Education: N/A   Social History Main Topics  . Smoking status: Former Smoker -- 1.00 packs/day for 13 years    Quit date: 11/30/1970  . Smokeless tobacco: Never Used  . Alcohol Use: No  . Drug Use: No  . Sexual Activity: Yes   Other Topics Concern  . None   Social History Narrative     Family History:  The patient's family history includes Cancer in his sister; Diabetes in his brother and sister; Heart disease in his brother, father, and mother; Hyperlipidemia in his brother and father; Hypertension in his brother, father, and sister; Stroke in his mother. There is no history of Early death.   ROS:   Please see the history of present illness.  All other systems are reviewed and otherwise negative.    PHYSICAL EXAM:   VS:  BP 142/68 mmHg  Pulse 60  Ht 6\' 2"  (1.88 m)  Wt 225 lb 9.6 oz (102.331 kg)  BMI 28.95 kg/m2  BMI: Body mass index is 28.95 kg/(m^2). GEN: Well nourished, well developed WM, in no acute distress HEENT: normocephalic, atraumatic Neck: no JVD or masses. Prior right CEA scar noted right neck. Cardiac: RRR; no rubs or gallops, no edema, very soft SEM Respiratory:  clear to auscultation bilaterally, normal work of breathing GI: soft,  nontender, nondistended, + BS MS: no deformity or atrophy Skin: warm and dry, no rash Neuro:  Alert and Oriented x 3, Strength and sensation are intact, follows commands Psych: euthymic mood, full affect  Wt Readings from Last 3 Encounters:  04/24/16 225 lb 9.6 oz (102.331 kg)  04/19/16 224 lb 4.8 oz (101.742 kg)  04/02/16 222 lb 6.4 oz (100.88 kg)      Studies/Labs Reviewed:   EKG:  EKG was ordered today and  personally reviewed by me. The EKG ordered today demonstrates NSR 60bpm, nonspecific T wave abnormality in III. QTc 370ms.  Recent Labs: 12/06/2015: ALT 24 04/19/2016: Hemoglobin 12.3*; Platelets 153 04/22/2016: BUN 16; Creat 1.32*; Potassium 4.5; Sodium 139   Lipid Panel    Component Value Date/Time   CHOL 146 05/13/2015 1635   TRIG 245* 05/13/2015 1635   HDL 34* 05/13/2015 1635   CHOLHDL 4.3 05/13/2015 1635   VLDL 49* 05/13/2015 1635   LDLCALC 63 05/13/2015 1635    Additional studies/ records that were reviewed today include: Summarized above.    ASSESSMENT & PLAN:   1. Paroxysmal atrial fibrillation - this is the first recurrence he's had in 4-5 years. We discussed various options including observation for recurrence versus antiarrhythmic medications. The patient and I both are hesitant to add any rate controlling medications due to h/o bradycardia on beta blockers in the past. He prefers to follow conservatively for now which is reasonable. He was on amiodarone in the past post-CABG but did not require this long term - this could be a consideration along with Tikosyn in the future if needed. Will check thyroid function and 2D echocardiogram. He inquired about repeat stress testing - he has not had any recent anginal symptoms whatsoever, troponin was negative, and EKG is unremarkable. Will f/u echocardiogram to determine need for further testing. Sleep study also pending. 2. AKI on CKD stage III - repeat Cr back to baseline by recent recheck. 3. CAD s/p prior CABG - no  recent anginal symptoms. Continue ASA, statin. He reports prior h/o bradycardia with metoprolol and carvedilol thus is not on beta blocker. I will touch base with Dr. Burt Knack about his preference for long-term ASA in the setting of concomitant Coumadin use. (Also has h/o carotid disease.) Addendum: Dr. Burt Knack recommends to continue low dose ASA. 4. Essential HTN - BP acceptable today. Sleep study pending.  Disposition: F/u with Dr. Burt Knack as scheduled in August.    Medication Adjustments/Labs and Tests Ordered: Current medicines are reviewed at length with the patient today.  Concerns regarding medicines are outlined above. Medication changes, Labs and Tests ordered today are listed in the Patient Instructions below.    Raechel Ache PA-C  04/24/2016 10:08 AM    [address], [place], [place]  [address] Phone: [phone]; Fax: [phone]

## 2016-04-28 ENCOUNTER — Telehealth: Payer: Self-pay | Admitting: *Deleted

## 2016-04-28 ENCOUNTER — Other Ambulatory Visit (INDEPENDENT_AMBULATORY_CARE_PROVIDER_SITE_OTHER): Payer: Medicare Other | Admitting: *Deleted

## 2016-04-28 DIAGNOSIS — I480 Paroxysmal atrial fibrillation: Secondary | ICD-10-CM

## 2016-04-28 DIAGNOSIS — R790 Abnormal level of blood mineral: Secondary | ICD-10-CM

## 2016-04-28 LAB — MAGNESIUM: Magnesium: 1.2 mg/dL — ABNORMAL LOW (ref 1.5–2.5)

## 2016-04-28 MED ORDER — MAGNESIUM OXIDE 400 MG PO TABS: ORAL_TABLET | ORAL | Status: DC

## 2016-04-28 NOTE — Telephone Encounter (Signed)
Called pt.  He states that he has started the Mag Ox 400 mg bid.  His level is still low 1.2 and we advised him to increase it to qid and repeat levels on Friday, 05/01/16. Pt agreeable with this plan. Orders in EPIC.

## 2016-04-28 NOTE — Telephone Encounter (Signed)
-----   Message from Charlie Pitter, [place] sent at 04/28/2016  4:29 PM EDT ----- Magnesium level remains very low. Increase Mag Ox to 400mg  four times a day and recheck end of this week. Dayna Dunn PA-C

## 2016-04-29 DIAGNOSIS — S66802D Unspecified injury of other specified muscles, fascia and tendons at wrist and hand level, left hand, subsequent encounter: Secondary | ICD-10-CM | POA: Diagnosis not present

## 2016-04-30 ENCOUNTER — Ambulatory Visit (HOSPITAL_BASED_OUTPATIENT_CLINIC_OR_DEPARTMENT_OTHER): Payer: Medicare Other | Attending: Physician Assistant | Admitting: Cardiovascular Disease

## 2016-04-30 VITALS — Ht 74.0 in | Wt 225.0 lb

## 2016-04-30 DIAGNOSIS — Z7982 Long term (current) use of aspirin: Secondary | ICD-10-CM | POA: Diagnosis not present

## 2016-04-30 DIAGNOSIS — Z7984 Long term (current) use of oral hypoglycemic drugs: Secondary | ICD-10-CM | POA: Diagnosis not present

## 2016-04-30 DIAGNOSIS — G4733 Obstructive sleep apnea (adult) (pediatric): Secondary | ICD-10-CM | POA: Insufficient documentation

## 2016-04-30 DIAGNOSIS — Z7901 Long term (current) use of anticoagulants: Secondary | ICD-10-CM | POA: Diagnosis not present

## 2016-04-30 DIAGNOSIS — Z79899 Other long term (current) drug therapy: Secondary | ICD-10-CM | POA: Insufficient documentation

## 2016-04-30 DIAGNOSIS — I480 Paroxysmal atrial fibrillation: Secondary | ICD-10-CM | POA: Diagnosis not present

## 2016-04-30 DIAGNOSIS — R0683 Snoring: Secondary | ICD-10-CM | POA: Diagnosis not present

## 2016-05-01 ENCOUNTER — Ambulatory Visit: Payer: Medicare Other | Admitting: Vascular Surgery

## 2016-05-01 ENCOUNTER — Encounter (HOSPITAL_COMMUNITY): Payer: Medicare Other

## 2016-05-01 ENCOUNTER — Other Ambulatory Visit (INDEPENDENT_AMBULATORY_CARE_PROVIDER_SITE_OTHER): Payer: Medicare Other | Admitting: *Deleted

## 2016-05-01 DIAGNOSIS — I480 Paroxysmal atrial fibrillation: Secondary | ICD-10-CM | POA: Diagnosis not present

## 2016-05-01 DIAGNOSIS — I4891 Unspecified atrial fibrillation: Secondary | ICD-10-CM | POA: Diagnosis not present

## 2016-05-01 LAB — MAGNESIUM: MAGNESIUM: 1.4 mg/dL — AB (ref 1.5–2.5)

## 2016-05-01 NOTE — Addendum Note (Signed)
Addended by: Eulis Foster on: 05/01/2016 07:42 AM   Modules accepted: Orders

## 2016-05-04 ENCOUNTER — Telehealth: Payer: Self-pay | Admitting: Cardiovascular Disease

## 2016-05-04 NOTE — Telephone Encounter (Signed)
New message  Pt returned call for Lab results. Please call

## 2016-05-04 NOTE — Telephone Encounter (Signed)
Notes Recorded by Charlie Pitter, PA-C on 05/01/2016 at 4:04 PM Please call patient. Magnesium has improved minimally on current regimen. Needs to continue mag supplement and eat foods rich in magnesium. At this point he needs to discuss further workup of low magnesium level with his PCP as there are some rare causes (i.e. Genetic issues, issues with kidney excretion) that can cause this persistently low level. Cannot push up mag supp further as he is really on max dose MagOx. Otherwise he would experience profound diarrhea. F/u PCP as above. Dayna Dunn PA-C  I spoke with the pt and made him aware of lab result.  The pt will continue current dosage of magnesium, increase magnesium rich food and follow up with PCP for further evaluation of low magnesium level.  Lab sent to the PCP through [facility].

## 2016-05-07 ENCOUNTER — Encounter (HOSPITAL_BASED_OUTPATIENT_CLINIC_OR_DEPARTMENT_OTHER): Payer: Self-pay | Admitting: Cardiovascular Disease

## 2016-05-07 NOTE — Procedures (Signed)
Patient Name: Thomas Marshall, Thomas Marshall Date: 04/30/2016 Gender: Male D.O.B: February 05, 1941 Age (years): 22 Referring Provider: Richardson Dopp Height (inches): 74 Interpreting Physician: Shelva Majestic MD, ABSM Weight (lbs): 225 RPSGT: Joni Reining BMI: 29 MRN: [id number] Neck Size: 17.00  CLINICAL INFORMATION Sleep Study Type: NPSG Indication for sleep study: Snoring Epworth Sleepiness Score: 2  SLEEP STUDY TECHNIQUE As per the AASM Manual for the Scoring of Sleep and Associated Events v2.3 (April 2016) with a hypopnea requiring 4% desaturations. The channels recorded and monitored were frontal, central and occipital EEG, electrooculogram (EOG), submentalis EMG (chin), nasal and oral airflow, thoracic and abdominal wall motion, anterior tibialis EMG, snore microphone, electrocardiogram, and pulse oximetry.  MEDICATIONS  aspirin 81 MG tablet 81 mg, Daily     atorvastatin (LIPITOR) 40 MG tablet 40 mg, Daily     cholecalciferol (VITAMIN D) 1000 UNITS tablet 1,000 Units, Daily     cloNIDine (CATAPRES) 0.1 MG tablet      glucose blood test strip      magnesium oxide (MAG-OX) 400 MG tablet      metFORMIN (GLUCOPHAGE) 1000 MG tablet 1,000 mg, 2 times daily with meals     nitroGLYCERIN (NITROSTAT) 0.4 MG SL tablet 0.4 mg, Every 5 min PRN     omeprazole (PRILOSEC) 20 MG capsule 20 mg, Daily     spironolactone (ALDACTONE) 25 MG tablet 25 mg, Daily     terazosin (HYTRIN) 1 MG capsule 1 mg, Daily at bedtime     vitamin C (ASCORBIC ACID) 500 MG tablet 500 mg, Daily     warfarin (COUMADIN) 10 MG tablet    Medications self-administered by patient during sleep study : No sleep medicine administered.  SLEEP ARCHITECTURE The study was initiated at 10:42:38 PM and ended at 4:52:24 AM. Sleep onset time was 43.2 minutes and the sleep efficiency was 65.1%. The total sleep time was 240.6 minutes. Wake after sleep onset (WASO) was 86 minutes Stage REM latency was 154.5 minutes. The patient spent  21.03% of the night in stage N1 sleep, 56.74% in stage N2 sleep, 0.00% in stage N3 and 22.24% in REM. Alpha intrusion was absent. Supine sleep was 12.51%.  RESPIRATORY PARAMETERS The overall apnea/hypopnea index (AHI) was 7.0 per hour. There were 16 total apneas, including 5 obstructive, 8 central and 3 mixed apneas. There were 12 hypopneas and 0 RERAs. The AHI during Stage REM sleep was 1.1 per hour. AHI while supine was 43.9 per hour. The mean oxygen saturation was 93.51%. The minimum SpO2 during sleep was 87.00%. Soft snoring was noted during this study.  CARDIAC DATA The 2 lead EKG demonstrated sinus rhythm. The mean heart rate was 55.00 beats per minute. Other EKG findings include: Rare PAC  LEG MOVEMENT DATA The total PLMS were 67 with a resulting PLMS index of 16.71. Associated arousal with leg movement index was 0.5.  IMPRESSIONS - Mild obstructive sleep apnea (AHI = 7.0/h); however, sleep apnea was severe during supine sleep with an AHI of 43.9/h. - No significant central sleep apnea occurred during this study (CAI = 2.0/h). - Mild oxygen desaturation  to a nadir of 87.00%. - Abnormal sleep architecture with absence of slow-wave sleep and prolonged latency to in REM sleep. - Reduced sleep efficiency. - The patient snored with Soft snoring volume. - No cardiac abnormalities were noted during this study. - Mild periodic limb movements of sleep occurred during the study. No significant associated arousals.  DIAGNOSIS - Obstructive Sleep Apnea (327.23 [G47.33 ICD-10])  RECOMMENDATIONS -  In this patient with significant cardiovascular comorbidities including CAD, status post CABG revascularization, and PAF, recommend CPAP titration for treatment of street sleep disordered breathing. - Effort should be made to optimize nasal and oropharyngeal patency. - The patient should be counseled to avoid supine sleep and consider positional therapy - Avoid alcohol, sedatives and other CNS  depressants that may worsen sleep apnea and disrupt normal sleep architecture. - Sleep hygiene should be reviewed to assess factors that may improve sleep quality. - Weight management and regular exercise should be initiated or continued if appropriate.  Troy Sine, MD, [place], American Board of Sleep Medicine  ELECTRONICALLY SIGNED ON:  05/07/2016, 8:32 PM [place] PH: [phone]   FX: [phone] [place]

## 2016-05-08 ENCOUNTER — Telehealth: Payer: Self-pay | Admitting: Cardiovascular Disease

## 2016-05-08 MED ORDER — MAGNESIUM OXIDE 400 MG PO TABS: ORAL_TABLET | ORAL | Status: DC

## 2016-05-08 NOTE — Telephone Encounter (Signed)
New Message  Pt calling to speak w/ RN- wanted to know if he needs to continue his Rx of magnesium. Please call back and discuss.

## 2016-05-08 NOTE — Telephone Encounter (Signed)
I spoke with the pt and made him aware that he needs to continue magnesium.  The pt requested that a new Rx be sent into the pharmacy.  Refill sent to the pharmacy.

## 2016-05-12 ENCOUNTER — Other Ambulatory Visit (HOSPITAL_COMMUNITY): Payer: Medicare Other

## 2016-05-22 ENCOUNTER — Encounter: Payer: Self-pay | Admitting: Family Medicine

## 2016-05-22 ENCOUNTER — Ambulatory Visit (INDEPENDENT_AMBULATORY_CARE_PROVIDER_SITE_OTHER): Payer: Medicare Other | Admitting: Family Medicine

## 2016-05-22 VITALS — BP 144/65 | HR 57 | Temp 97.5°F | Ht 74.0 in | Wt 223.8 lb

## 2016-05-22 DIAGNOSIS — E1122 Type 2 diabetes mellitus with diabetic chronic kidney disease: Secondary | ICD-10-CM

## 2016-05-22 DIAGNOSIS — I2510 Atherosclerotic heart disease of native coronary artery without angina pectoris: Secondary | ICD-10-CM | POA: Diagnosis not present

## 2016-05-22 DIAGNOSIS — Z0189 Encounter for other specified special examinations: Secondary | ICD-10-CM

## 2016-05-22 DIAGNOSIS — N182 Chronic kidney disease, stage 2 (mild): Secondary | ICD-10-CM

## 2016-05-22 DIAGNOSIS — N400 Enlarged prostate without lower urinary tract symptoms: Secondary | ICD-10-CM | POA: Diagnosis not present

## 2016-05-22 DIAGNOSIS — Z23 Encounter for immunization: Secondary | ICD-10-CM | POA: Diagnosis not present

## 2016-05-22 DIAGNOSIS — Z0000 Encounter for general adult medical examination without abnormal findings: Secondary | ICD-10-CM

## 2016-05-22 DIAGNOSIS — I10 Essential (primary) hypertension: Secondary | ICD-10-CM

## 2016-05-22 LAB — POCT GLYCOSYLATED HEMOGLOBIN (HGB A1C): HEMOGLOBIN A1C: 7

## 2016-05-22 MED ORDER — NITROGLYCERIN 0.4 MG SL SUBL: 0.4000 mg | SUBLINGUAL_TABLET | SUBLINGUAL | Status: AC | PRN

## 2016-05-22 NOTE — Assessment & Plan Note (Signed)
Recheck potassium and magnesium today Continue magnesium supplementation

## 2016-05-22 NOTE — Assessment & Plan Note (Signed)
A1c well controlled at 7.0 today Goal A1c less than 7.5 given advanced age Continue current regimen of metformin as patient does not want to change to either daily or twice daily dosing UTD on foot and eye exam

## 2016-05-22 NOTE — Assessment & Plan Note (Signed)
Slightly above goal in office today Reportedly well-controlled on home measurements Patient with multiple allergies to multiple antihypertensives Continue current regimen at this time

## 2016-05-22 NOTE — Progress Notes (Signed)
Subjective:   Thomas Marshall is a 75 y.o. male with a history of HTN, PVD, AAA, carotid stenosis, CKD3, T2DM here for DM and HTN f/u  HTN: - Medications: clonidine 0.1mg  BID, spironolactone 25mg  daily - Compliance: good  - Checking BP at home: yes - 118/80s this AM, usually [id number] systolic - Denies any SOB, CP, vision changes, LE edema, medication SEs, or symptoms of hypotension - Diet: eats everything, doesn't avoid carbs, trying to eat magnesium rich foods - Exercise: none  T2DM - Checking BG at home: no - Medications: metformin 1000mg  daily and a second dose every other day - Compliance: good - eye exam: 03/2016 per report at [facility] - foot exam: 01/2016 - denies symptoms of hypoglycemia, polyuria, polydipsia  Hypomagnesemia - taking 1600mg  total daily - was feeling sluggish - had episode of a fib - wants it rechecked  BPH - was previously seen by urology yearly - had 2 previous procedures - stream is slower and more hesitation in starting urination - difficulty getting erection - worse since going on spironolactone  Review of Systems:  Per HPI.   Social History: former smoker - quit in 1972  Objective:  BP 144/65 mmHg  Pulse 57  Temp(Src) 97.5 F (36.4 C) (Oral)  Ht 6\' 2"  (1.88 m)  Wt 223 lb 12.8 oz (101.515 kg)  BMI 28.72 kg/m2  Gen:  75 y.o. male in NAD HEENT: NCAT, MMM, EOMI, PERRL, anicteric sclerae CV: RRR, no MRG Resp: Non-labored, CTAB, no wheezes noted Ext: WWP, no edema MSK: No obvious deformities Neuro: Alert and oriented, speech normal      Chemistry      Component Value Date/Time   NA 139 04/22/2016 0944   K 4.5 04/22/2016 0944   CL 105 04/22/2016 0944   CO2 22 04/22/2016 0944   BUN 16 04/22/2016 0944   CREATININE 1.32* 04/22/2016 0944   CREATININE 1.70* 04/19/2016 1005      Component Value Date/Time   CALCIUM 8.1* 04/22/2016 0944   ALKPHOS 76 12/06/2015 0931   AST 26 12/06/2015 0931   ALT 24 12/06/2015 0931   BILITOT 0.6 12/06/2015  0931      Lab Results  Component Value Date   WBC 4.9 04/19/2016   HGB 12.3* 04/19/2016   HCT 38.4* 04/19/2016   MCV 91.0 04/19/2016   PLT 153 04/19/2016   Lab Results  Component Value Date   TSH 2.39 04/24/2016   Lab Results  Component Value Date   HGBA1C 7.0 05/22/2016   Assessment & Plan:     [name] is a 75 y.o. male here for  Essential hypertension Slightly above goal in office today Reportedly well-controlled on home measurements Patient with multiple allergies to multiple antihypertensives Continue current regimen at this time  Type 2 diabetes mellitus with renal manifestations A1c well controlled at 7.0 today Goal A1c less than 7.5 given advanced age Continue current regimen of metformin as patient does not want to change to either daily or twice daily dosing UTD on foot and eye exam  BPH (benign prostatic hypertrophy) Referral placed for urology Patient is concerned about inability to get an erection as well as slowed urinary stream Since patient has had multiple procedures in the past, it is best for him to follow with urology Last PSA in 01/2016 1.8 when previously 0.7 in 2015 Advised patient that he cannot take Viagra while taking sublingual nitroglycerin for chest pain after his 4 vessel CABG  Hypomagnesemia Recheck potassium and  magnesium today Continue magnesium supplementation       Virginia Crews, MD MPH PGY-2,  [facility] 05/22/2016  12:02 PM

## 2016-05-22 NOTE — Patient Instructions (Signed)
Nice to see you again today. Your A1c is well controlled at 7.0. Continue taking your current medications for blood pressure and diabetes. We are getting some labs today and someone will call you or send you a letter with the results when they're available.  I put in a referral to urology and someone will call you about that within the next 2 weeks. I will see back in 6 months  Take care, Dr. Jacinto Reap

## 2016-05-22 NOTE — Assessment & Plan Note (Signed)
Referral placed for urology Patient is concerned about inability to get an erection as well as slowed urinary stream Since patient has had multiple procedures in the past, it is best for him to follow with urology Last PSA in 01/2016 1.8 when previously 0.7 in 2015 Advised patient that he cannot take Viagra while taking sublingual nitroglycerin for chest pain after his 4 vessel CABG

## 2016-05-23 ENCOUNTER — Encounter: Payer: Self-pay | Admitting: Family Medicine

## 2016-05-23 LAB — BASIC METABOLIC PANEL WITH GFR
BUN: 22 mg/dL (ref 7–25)
CALCIUM: 8.6 mg/dL (ref 8.6–10.3)
CO2: 26 mmol/L (ref 20–31)
Chloride: 103 mmol/L (ref 98–110)
Creat: 1.45 mg/dL — ABNORMAL HIGH (ref 0.70–1.18)
GFR, EST AFRICAN AMERICAN: 54 mL/min — AB (ref >=60)
GFR, Est Non African American: 46 mL/min — ABNORMAL LOW (ref >=60)
Glucose, Bld: 129 mg/dL — ABNORMAL HIGH (ref 65–99)
Potassium: 4.3 mmol/L (ref 3.5–5.3)
Sodium: 140 mmol/L (ref 135–146)

## 2016-05-23 LAB — MAGNESIUM: MAGNESIUM: 1.9 mg/dL (ref 1.5–2.5)

## 2016-05-25 ENCOUNTER — Telehealth: Payer: Self-pay | Admitting: Cardiovascular Disease

## 2016-05-25 NOTE — Telephone Encounter (Signed)
Returned pts call.  He has been advised to continue on the 1600 mg Magnesium, as long as he is not having any affects from it.  Per pt, he isn't having any as he knows of. Pt is agreeable with this plan.

## 2016-05-25 NOTE — Telephone Encounter (Signed)
Will forward to Dr. Burt Knack and Melina Copa, PA-C for review and advisement.

## 2016-05-25 NOTE — Telephone Encounter (Signed)
New Message:   Pt Magnesium was low,he was put on 1600 mg. Last Friday he had it checked,it is now 1.9. He wants to know if he should take the same dosage or decrease it?

## 2016-05-25 NOTE — Telephone Encounter (Signed)
It is likely the magnesium supplement that is keeping his magnesium within the normal range. Would continue if he is not having any adverse effects. Baylon Santelli PA-C

## 2016-05-27 ENCOUNTER — Ambulatory Visit (HOSPITAL_COMMUNITY): Payer: Medicare Other | Attending: Cardiology

## 2016-05-27 ENCOUNTER — Other Ambulatory Visit: Payer: Self-pay

## 2016-05-27 DIAGNOSIS — I77810 Thoracic aortic ectasia: Secondary | ICD-10-CM | POA: Insufficient documentation

## 2016-05-27 DIAGNOSIS — I340 Nonrheumatic mitral (valve) insufficiency: Secondary | ICD-10-CM | POA: Diagnosis not present

## 2016-05-27 DIAGNOSIS — I480 Paroxysmal atrial fibrillation: Secondary | ICD-10-CM | POA: Insufficient documentation

## 2016-05-27 DIAGNOSIS — I358 Other nonrheumatic aortic valve disorders: Secondary | ICD-10-CM | POA: Diagnosis not present

## 2016-05-27 DIAGNOSIS — Z951 Presence of aortocoronary bypass graft: Secondary | ICD-10-CM | POA: Insufficient documentation

## 2016-05-27 DIAGNOSIS — I119 Hypertensive heart disease without heart failure: Secondary | ICD-10-CM | POA: Insufficient documentation

## 2016-05-27 DIAGNOSIS — I4891 Unspecified atrial fibrillation: Secondary | ICD-10-CM | POA: Diagnosis present

## 2016-05-27 LAB — ECHOCARDIOGRAM COMPLETE
Ao-asc: 36 cm
CHL CUP RV SYS PRESS: 29 mmHg
CHL CUP TV REG PEAK VELOCITY: 255 cm/s
E/e' ratio: 12.46
EWDT: 299 ms
FS: 28 % (ref 28–44)
IVS/LV PW RATIO, ED: 1.27
LA ID, A-P, ES: 42 mm
LA diam end sys: 42 mm
LA diam index: 1.81 cm/m2
LA vol A4C: 110 ml
LA vol index: 44 mL/m2
LAVOL: 102 mL
LV E/e' medial: 12.46
LV TDI E'LATERAL: 7.8
LV dias vol: 83 mL (ref 62–150)
LVDIAVOLIN: 36 mL/m2
LVEEAVG: 12.46
LVELAT: 7.8 cm/s
LVOT SV: 103 mL
LVOT VTI: 29.7 cm
LVOT area: 3.46 cm2
LVOT peak grad rest: 6 mmHg
LVOTD: 21 mm
LVOTPV: 118 cm/s
LVSYSVOL: 32 mL (ref 21–61)
LVSYSVOLIN: 14 mL/m2
MV Dec: 299
MV Peak grad: 4 mmHg
MV pk A vel: 92.8 m/s
MV pk E vel: 97.2 m/s
PW: 13.3 mm — AB (ref 0.6–1.1)
RV LATERAL S' VELOCITY: 11.5 cm/s
Simpson's disk: 61
Stroke v: 51 ml
TDI e' medial: 6.63
TR max vel: 255 cm/s

## 2016-06-05 ENCOUNTER — Ambulatory Visit (INDEPENDENT_AMBULATORY_CARE_PROVIDER_SITE_OTHER): Payer: Medicare Other | Admitting: *Deleted

## 2016-06-05 ENCOUNTER — Telehealth: Payer: Self-pay | Admitting: Emergency Medicine

## 2016-06-05 DIAGNOSIS — Z7901 Long term (current) use of anticoagulants: Secondary | ICD-10-CM | POA: Diagnosis not present

## 2016-06-05 DIAGNOSIS — I480 Paroxysmal atrial fibrillation: Secondary | ICD-10-CM | POA: Diagnosis not present

## 2016-06-05 DIAGNOSIS — I4891 Unspecified atrial fibrillation: Secondary | ICD-10-CM | POA: Diagnosis not present

## 2016-06-05 LAB — POCT INR: INR: 1.8

## 2016-06-05 NOTE — Telephone Encounter (Signed)
Patients wants to know if you will take him on as a new pt. Previous dr is retiring. You were recommended. Please advise thanks.

## 2016-06-05 NOTE — Telephone Encounter (Signed)
Ok with me 

## 2016-06-11 DIAGNOSIS — L570 Actinic keratosis: Secondary | ICD-10-CM | POA: Diagnosis not present

## 2016-06-11 DIAGNOSIS — C44329 Squamous cell carcinoma of skin of other parts of face: Secondary | ICD-10-CM | POA: Diagnosis not present

## 2016-06-16 ENCOUNTER — Encounter: Payer: Self-pay | Admitting: *Deleted

## 2016-06-17 DIAGNOSIS — N5201 Erectile dysfunction due to arterial insufficiency: Secondary | ICD-10-CM | POA: Diagnosis not present

## 2016-06-18 ENCOUNTER — Ambulatory Visit (INDEPENDENT_AMBULATORY_CARE_PROVIDER_SITE_OTHER): Payer: Medicare Other | Admitting: Pharmacist

## 2016-06-18 DIAGNOSIS — I480 Paroxysmal atrial fibrillation: Secondary | ICD-10-CM | POA: Diagnosis not present

## 2016-06-18 DIAGNOSIS — Z7901 Long term (current) use of anticoagulants: Secondary | ICD-10-CM

## 2016-06-18 DIAGNOSIS — I4891 Unspecified atrial fibrillation: Secondary | ICD-10-CM

## 2016-06-18 LAB — POCT INR: INR: 2.4

## 2016-07-03 ENCOUNTER — Ambulatory Visit (INDEPENDENT_AMBULATORY_CARE_PROVIDER_SITE_OTHER): Payer: Medicare Other | Admitting: Cardiovascular Disease

## 2016-07-03 ENCOUNTER — Encounter: Payer: Self-pay | Admitting: Cardiovascular Disease

## 2016-07-03 VITALS — BP 140/60 | HR 45 | Ht 74.0 in | Wt 230.0 lb

## 2016-07-03 DIAGNOSIS — I4891 Unspecified atrial fibrillation: Secondary | ICD-10-CM | POA: Diagnosis not present

## 2016-07-03 DIAGNOSIS — I2510 Atherosclerotic heart disease of native coronary artery without angina pectoris: Secondary | ICD-10-CM

## 2016-07-03 DIAGNOSIS — E785 Hyperlipidemia, unspecified: Secondary | ICD-10-CM

## 2016-07-03 DIAGNOSIS — I10 Essential (primary) hypertension: Secondary | ICD-10-CM

## 2016-07-03 NOTE — Progress Notes (Signed)
Cardiology Office Note Date:  07/03/2016   ID:  Thomas Marshall, Thomas Marshall 10/21/1941, MRN [id number]  PCP:  Lavon Paganini, MD  Cardiologist:  [name], MD    Chief Complaint  Patient presents with  . Follow-up    atrial fibrillation   History of Present Illness: Thomas Marshall is a 75 y.o. male who presents for follow-up of CAD and paroxysmal atrial fibrillation. He underwent CABG in 2004. He has paroxysmal atrial fibrillation maintained on chronic warfarin and he underwent DCCV in May 2017 after presenting with symptomatic atrial fibrillation. Other problems include Type II diabetes and hypertension with multiple antihypertensive drug intolerances.  The patient is here with his wife today. He tells me that he did not feel when he was in atrial fibrillation other than having profound fatigue. He continues to complain of generalized fatigue. He naps during the day frequently. He is able to still get out and do some yard work. He denies exertional. He specifically denies chest pain, chest pressure, shortness of breath, leg swelling, or heart palpitations.  Past Medical History:  Diagnosis Date  . AAA (abdominal aortic aneurysm) ([place])   . Arthritis   . Cancer (HCC)    skin & squamous cell  . Carotid artery disease ([place])    a. s/p R CEA 12/2015.  [name] CKD (chronic kidney disease), stage III    a. per historical labs.  . Coronary artery disease    a. s/p CABG 2004.  . Diabetes mellitus without complication ([place])   . GERD (gastroesophageal reflux disease)   . Hyperlipidemia   . Hypertension   . Medication intolerance Multiple   . PAF (paroxysmal atrial fibrillation) ([place])   . Pneumonia   . Sinus bradycardia    a. baseline HR 50s-60s, also h/o bradycardia on metoprolol and carvedilol    Past Surgical History:  Procedure Laterality Date  . ANKLE SURGERY     right fused  . BACK SURGERY     low back   . CHOLECYSTECTOMY N/A 12/07/2013   Procedure: LAPAROSCOPIC CHOLECYSTECTOMY ;   Surgeon: [name], MD;  Location: [facility];  Service: General;  Laterality: N/A;  . COLONOSCOPY    . CORONARY ARTERY BYPASS GRAFT  2004   [place]  . ENDARTERECTOMY Right 12/10/2015   Procedure: Right Carotid ENDARTERECTOMY;  Surgeon: Conrad Doddridge, MD;  Location: [facility];  Service: Vascular;  Laterality: Right;  . EXPLORATION POST OPERATIVE OPEN HEART    . FINGER SURGERY     skin graft on rt index  . FINGER SURGERY Right    right index finger.  [name] HERNIA REPAIR Right    RI[facility]  . JOINT REPLACEMENT    . KNEE SURGERY     replacement on both knees  . PROSTATE SURGERY    . TONSILLECTOMY    . URINARY SURGERY     scar tissue    Current Outpatient Prescriptions  Medication Sig Dispense Refill  . aspirin 81 MG tablet Take 81 mg by mouth daily.    [name] atorvastatin (LIPITOR) 40 MG tablet Take 40 mg by mouth daily.    . cholecalciferol (VITAMIN D) 1000 UNITS tablet Take 1,000 Units by mouth daily.    . cloNIDine (CATAPRES) 0.1 MG tablet TAKE ONE TABLET BY MOUTH TWICE DAILY 180 tablet 2  . glucose blood test strip Use to test blood glucose level 2 times a day. 100 each 12  . magnesium oxide (MAG-OX) 400 MG tablet Take 1 tablet by mouth  4 times a day 120 tablet 2  . metFORMIN (GLUCOPHAGE) 1000 MG tablet Take 1 tablet (1,000 mg total) by mouth 2 (two) times daily with a meal. 180 tablet 3  . nitroGLYCERIN (NITROSTAT) 0.4 MG SL tablet Place 1 tablet (0.4 mg total) under the tongue every 5 (five) minutes as needed for chest pain (x 3 doses). 30 tablet 3  . omeprazole (PRILOSEC) 20 MG capsule Take 20 mg by mouth daily.    [name] spironolactone (ALDACTONE) 25 MG tablet Take 1 tablet (25 mg total) by mouth daily. 90 tablet 1  . terazosin (HYTRIN) 1 MG capsule Take 1 capsule (1 mg total) by mouth at bedtime. 30 capsule 11  . vitamin C (ASCORBIC ACID) 500 MG tablet Take 500 mg by mouth daily.    [name] warfarin (COUMADIN) 10 MG tablet TAKE ONE TABLET BY MOUTH ONCE DAILY AS DIRECTED 100 tablet 1   No current  facility-administered medications for this visit.     Allergies:   Lisinopril; Losartan; Nifedipine; Triamterene; Hydralazine hcl; Amoxicillin; Ampicillin; and Carvedilol   Social History:  The patient  reports that he quit smoking about 45 years ago. He has a 13.00 pack-year smoking history. He has never used smokeless tobacco. He reports that he does not drink alcohol or use drugs.   Family History:  The patient's family history includes Cancer in his sister; Diabetes in his brother and sister; Heart disease in his brother, father, and mother; Hyperlipidemia in his brother and father; Hypertension in his brother, father, and sister; Stroke in his mother.    ROS:  Please see the history of present illness.  All other systems are reviewed and negative.    PHYSICAL EXAM: VS:  BP 140/60   Pulse (!) 45   Ht 6\' 2"  (1.88 m)   Wt 230 lb (104.3 kg)   BMI 29.53 kg/m  , BMI Body mass index is 29.53 kg/m. GEN: Well nourished, well developed, in no acute distress  HEENT: normal  Neck: no JVD, no masses. R CEA site well-healed Cardiac: bradycardic and regular without murmur or gallop                Respiratory:  clear to auscultation bilaterally, normal work of breathing GI: soft, nontender, nondistended, + BS MS: no deformity or atrophy  Ext: no pretibial edema, pedal pulses 2+= bilaterally Skin: warm and dry, no rash Neuro:  Strength and sensation are intact Psych: euthymic mood, full affect  EKG:  EKG is ordered today. The ekg ordered today shows Sinus brady 45 bpm, otherwise within normal limits  Recent Labs: 12/06/2015: ALT 24 04/19/2016: Hemoglobin 12.3; Platelets 153 04/24/2016: TSH 2.39 05/22/2016: BUN 22; Creat 1.45; Magnesium 1.9; Potassium 4.3; Sodium 140   Lipid Panel     Component Value Date/Time   CHOL 146 05/13/2015 1635   TRIG 245 (H) 05/13/2015 1635   HDL 34 (L) 05/13/2015 1635   CHOLHDL 4.3 05/13/2015 1635   VLDL 49 (H) 05/13/2015 1635   LDLCALC 63 05/13/2015 1635        Wt Readings from Last 3 Encounters:  07/03/16 230 lb (104.3 kg)  05/22/16 223 lb 12.8 oz (101.5 kg)  04/30/16 225 lb (102.1 kg)     Cardiac Studies Reviewed: 2D Echo 05-27-2016: Study Conclusions  - Left ventricle: The cavity size was normal. Wall thickness was   increased in a pattern of mild LVH. Systolic function was normal.   The estimated ejection fraction was in the range of 55% to  60%.   Wall motion was normal; there were no regional wall motion   abnormalities. Features are consistent with a pseudonormal left   ventricular filling pattern, with concomitant abnormal relaxation   and increased filling pressure (grade 2 diastolic dysfunction). - Aortic valve: Trileaflet; moderately calcified leaflets.   Sclerosis without stenosis. - Aorta: Mildly dilated aortic root. - Mitral valve: Moderately to severely calcified annulus. There was   trivial regurgitation. - Left atrium: The atrium was moderately dilated. - Right ventricle: The cavity size was normal. Systolic function   was normal. - Right atrium: The atrium was mildly dilated. - Tricuspid valve: Peak RV-RA gradient (S): 26 mm Hg. - Pulmonary arteries: PA peak pressure: 29 mm Hg (S). - Inferior vena cava: The vessel was normal in size. The   respirophasic diameter changes were in the normal range (>= 50%),   consistent with normal central venous pressure.  Impressions:  - Normal LV size with mild LV hypertrophy. EF 55-60%. Moderate   diastolic dysfunction. Normal RV size and systolic function.   Aortic sclerosis without significant stenosis. Biatrial   enlargement.   ASSESSMENT AND PLAN: 1.  Paroxysmal atrial fibrillation: Maintaining sinus rhythm. Anticoagulated with warfarin. Appears stable. ConsiderTikosyn if recurrence.  2. CAD s/p CABG, without angina: The patient is stable. He takes aspirin 81 mg daily in addition to chronic oral anticoagulation. An achy benefits from the addition of antiplatelet  therapy considering his coronary and vascular disease. Continue risk factor modification.  3. HTN with CKD III: Blood pressure is controlled on a combination of clonidine, Terazosin, and spironolactone  4. Type II DM: followed by PCP  5. Hyperlipidemia: Last lipids reviewed.   6. Carotid stenosis without history of stroke: s/p CEA, followed by [facility] (Dr Bridgett Larsson)  7. AAA: small 3.0 cm AAA - recent duplex reviewed. 2 year FU recommended.  Current medicines are reviewed with the patient today.  The patient does not have concerns regarding medicines.  Labs/ tests ordered today include:   Orders Placed This Encounter  Procedures  . EKG 12-Lead    Disposition:   FU 6 months  Signed, [name], MD  07/03/2016 [facility] [place], [place], [place]  [address] Phone: [phone]; Fax: [phone]

## 2016-07-03 NOTE — Patient Instructions (Signed)

## 2016-07-20 ENCOUNTER — Ambulatory Visit: Payer: Medicare Other | Admitting: Cardiovascular Disease

## 2016-07-20 ENCOUNTER — Ambulatory Visit (INDEPENDENT_AMBULATORY_CARE_PROVIDER_SITE_OTHER): Payer: Medicare Other

## 2016-07-20 DIAGNOSIS — I480 Paroxysmal atrial fibrillation: Secondary | ICD-10-CM | POA: Diagnosis not present

## 2016-07-20 DIAGNOSIS — Z7901 Long term (current) use of anticoagulants: Secondary | ICD-10-CM

## 2016-07-20 DIAGNOSIS — I4891 Unspecified atrial fibrillation: Secondary | ICD-10-CM | POA: Diagnosis not present

## 2016-07-20 LAB — POCT INR: INR: 2.3

## 2016-07-29 ENCOUNTER — Ambulatory Visit: Payer: Medicare Other | Admitting: Podiatry

## 2016-07-31 ENCOUNTER — Ambulatory Visit (INDEPENDENT_AMBULATORY_CARE_PROVIDER_SITE_OTHER): Payer: Medicare Other | Admitting: *Deleted

## 2016-07-31 DIAGNOSIS — Z23 Encounter for immunization: Secondary | ICD-10-CM | POA: Diagnosis present

## 2016-08-06 ENCOUNTER — Emergency Department (HOSPITAL_BASED_OUTPATIENT_CLINIC_OR_DEPARTMENT_OTHER): Payer: Medicare Other

## 2016-08-06 ENCOUNTER — Encounter (HOSPITAL_BASED_OUTPATIENT_CLINIC_OR_DEPARTMENT_OTHER): Payer: Self-pay | Admitting: *Deleted

## 2016-08-06 ENCOUNTER — Emergency Department (HOSPITAL_BASED_OUTPATIENT_CLINIC_OR_DEPARTMENT_OTHER)
Admission: EM | Admit: 2016-08-06 | Discharge: 2016-08-06 | Disposition: A | Discharge status: Home / Self Care | Payer: Medicare Other | Attending: Emergency Medicine | Admitting: Emergency Medicine

## 2016-08-06 DIAGNOSIS — Z85828 Personal history of other malignant neoplasm of skin: Secondary | ICD-10-CM | POA: Insufficient documentation

## 2016-08-06 DIAGNOSIS — Z7901 Long term (current) use of anticoagulants: Secondary | ICD-10-CM | POA: Insufficient documentation

## 2016-08-06 DIAGNOSIS — E1122 Type 2 diabetes mellitus with diabetic chronic kidney disease: Secondary | ICD-10-CM | POA: Insufficient documentation

## 2016-08-06 DIAGNOSIS — Z87891 Personal history of nicotine dependence: Secondary | ICD-10-CM | POA: Insufficient documentation

## 2016-08-06 DIAGNOSIS — I2510 Atherosclerotic heart disease of native coronary artery without angina pectoris: Secondary | ICD-10-CM | POA: Insufficient documentation

## 2016-08-06 DIAGNOSIS — N183 Chronic kidney disease, stage 3 (moderate): Secondary | ICD-10-CM | POA: Diagnosis not present

## 2016-08-06 DIAGNOSIS — Z951 Presence of aortocoronary bypass graft: Secondary | ICD-10-CM | POA: Insufficient documentation

## 2016-08-06 DIAGNOSIS — Z7982 Long term (current) use of aspirin: Secondary | ICD-10-CM | POA: Insufficient documentation

## 2016-08-06 DIAGNOSIS — Z7984 Long term (current) use of oral hypoglycemic drugs: Secondary | ICD-10-CM | POA: Diagnosis not present

## 2016-08-06 DIAGNOSIS — R109 Unspecified abdominal pain: Secondary | ICD-10-CM | POA: Diagnosis not present

## 2016-08-06 DIAGNOSIS — Z8679 Personal history of other diseases of the circulatory system: Secondary | ICD-10-CM | POA: Insufficient documentation

## 2016-08-06 DIAGNOSIS — I129 Hypertensive chronic kidney disease with stage 1 through stage 4 chronic kidney disease, or unspecified chronic kidney disease: Secondary | ICD-10-CM | POA: Diagnosis not present

## 2016-08-06 LAB — COMPREHENSIVE METABOLIC PANEL
ALBUMIN: 3.9 g/dL (ref 3.5–5.0)
ALT: 21 U/L (ref 17–63)
AST: 21 U/L (ref 15–41)
Alkaline Phosphatase: 72 U/L (ref 38–126)
Anion gap: 7 (ref 5–15)
BUN: 25 mg/dL — AB (ref 6–20)
CHLORIDE: 103 mmol/L (ref 101–111)
CO2: 27 mmol/L (ref 22–32)
CREATININE: 1.69 mg/dL — AB (ref 0.61–1.24)
Calcium: 8.8 mg/dL — ABNORMAL LOW (ref 8.9–10.3)
GFR calc Af Amer: 43 mL/min — ABNORMAL LOW (ref >60)
GFR, EST NON AFRICAN AMERICAN: 37 mL/min — AB (ref >60)
Glucose, Bld: 143 mg/dL — ABNORMAL HIGH (ref 65–99)
POTASSIUM: 4.7 mmol/L (ref 3.5–5.1)
SODIUM: 137 mmol/L (ref 135–145)
Total Bilirubin: 0.6 mg/dL (ref 0.3–1.2)
Total Protein: 6.9 g/dL (ref 6.5–8.1)

## 2016-08-06 LAB — CBC
HCT: 37.2 % — ABNORMAL LOW (ref 39.0–52.0)
Hemoglobin: 12.3 g/dL — ABNORMAL LOW (ref 13.0–17.0)
MCH: 30.7 pg (ref 26.0–34.0)
MCHC: 33.1 g/dL (ref 30.0–36.0)
MCV: 92.8 fL (ref 78.0–100.0)
PLATELETS: 115 10*3/uL — AB (ref 150–400)
RBC: 4.01 MIL/uL — AB (ref 4.22–5.81)
RDW: 13.3 % (ref 11.5–15.5)
WBC: 5.5 10*3/uL (ref 4.0–10.5)

## 2016-08-06 LAB — URINALYSIS, ROUTINE W REFLEX MICROSCOPIC
BILIRUBIN URINE: NEGATIVE
Glucose, UA: NEGATIVE mg/dL
HGB URINE DIPSTICK: NEGATIVE
KETONES UR: 15 mg/dL — AB
Leukocytes, UA: NEGATIVE
NITRITE: NEGATIVE
PROTEIN: NEGATIVE mg/dL
SPECIFIC GRAVITY, URINE: 1.025 (ref 1.005–1.030)
pH: 5.5 (ref 5.0–8.0)

## 2016-08-06 IMAGING — CT CT RENAL STONE PROTOCOL
2 of 4 series · 16 of 46 positions shown, 18 images · non-contrast
Comparison: None.

CLINICAL DATA: Right flank pain

EXAM:
CT ABDOMEN AND PELVIS WITHOUT CONTRAST
TECHNIQUE: Multidetector CT imaging of the abdomen and pelvis was performed
following the standard protocol without IV contrast.

[Series 2: axial st · axial · 0.92mm/px · z∈[-505,-85]mm · 13 of 92 slices shown, 15 images]
[im 4/92  soft-tissue]
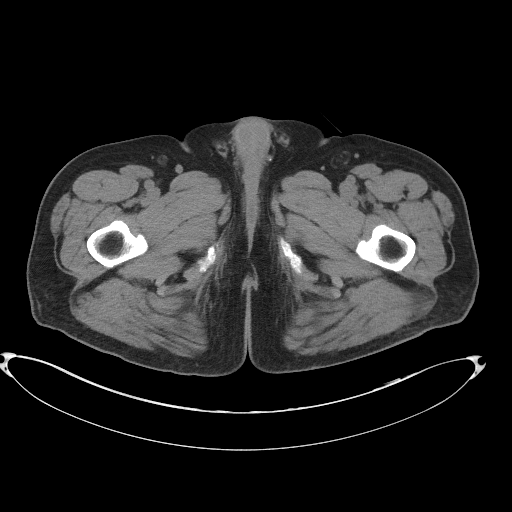
[im 4/92  bone]
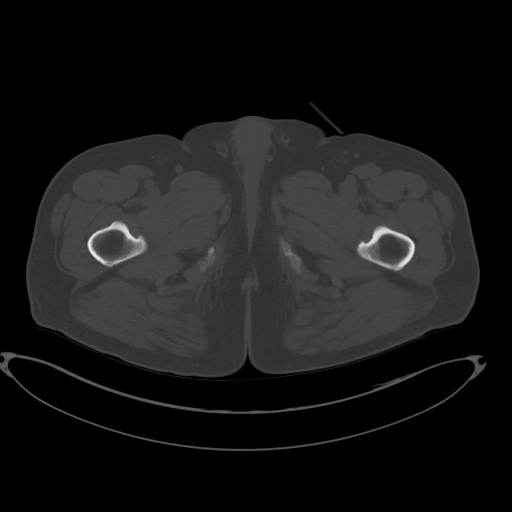
[im 11/92  soft-tissue]
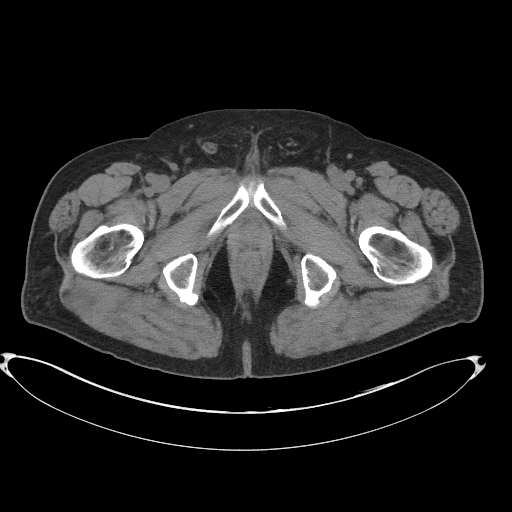
[im 18/92  soft-tissue]
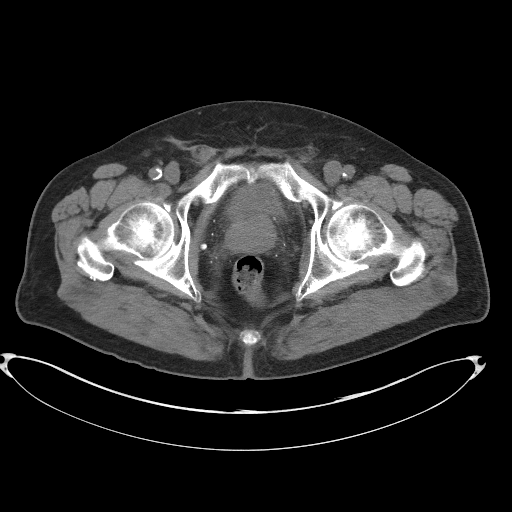
[im 25/92  soft-tissue]
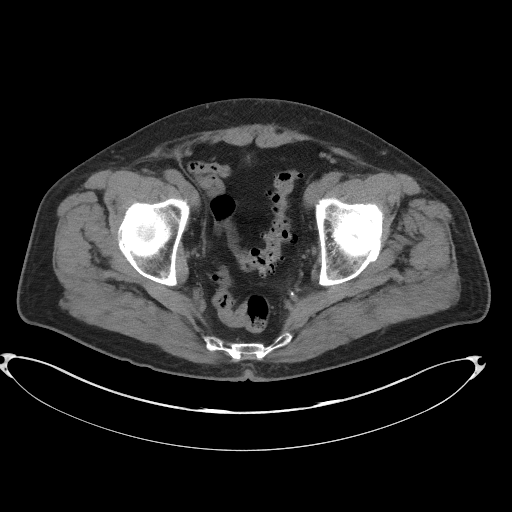
[im 32/92  soft-tissue]
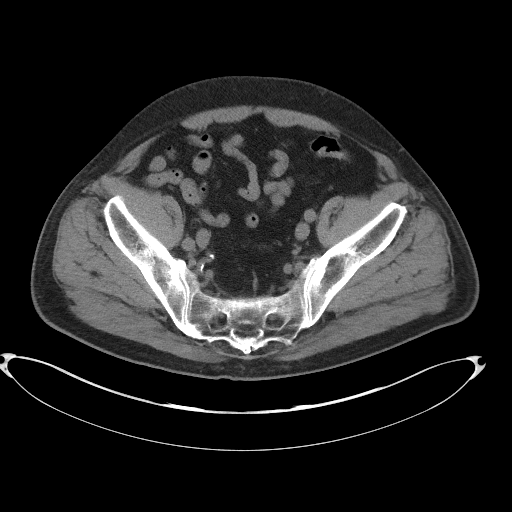
[im 39/92  soft-tissue]
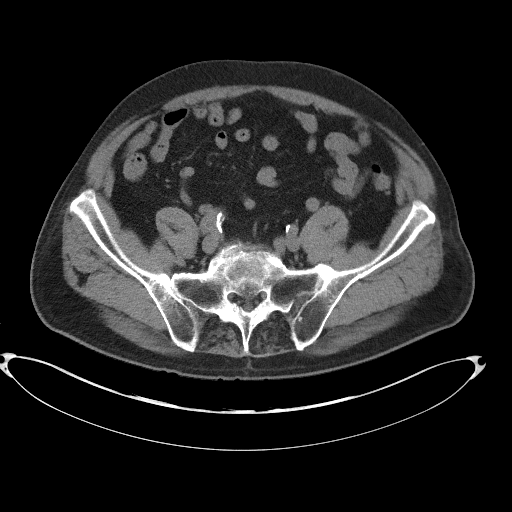
[im 46/92  soft-tissue]
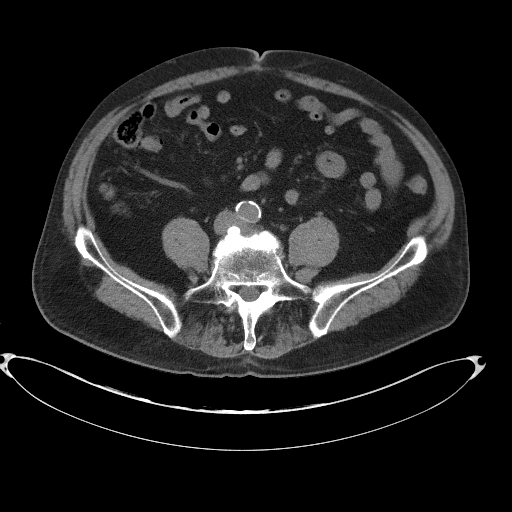
[im 53/92  soft-tissue]
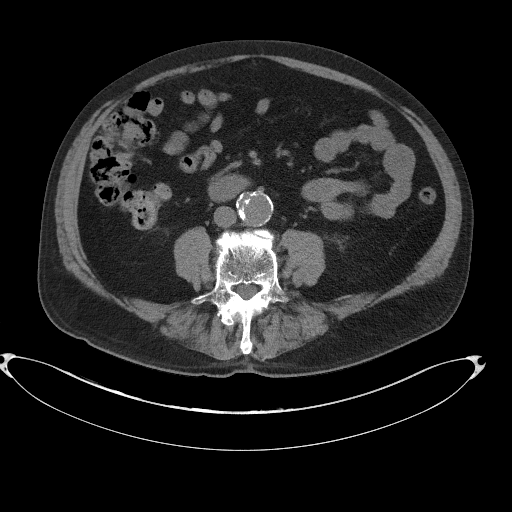
[im 60/92  soft-tissue]
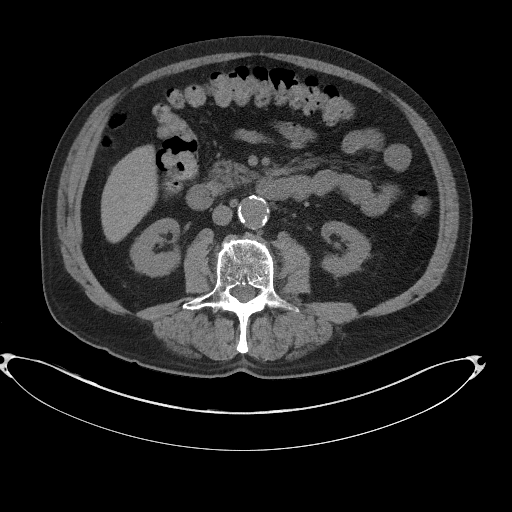
[im 60/92  bone]
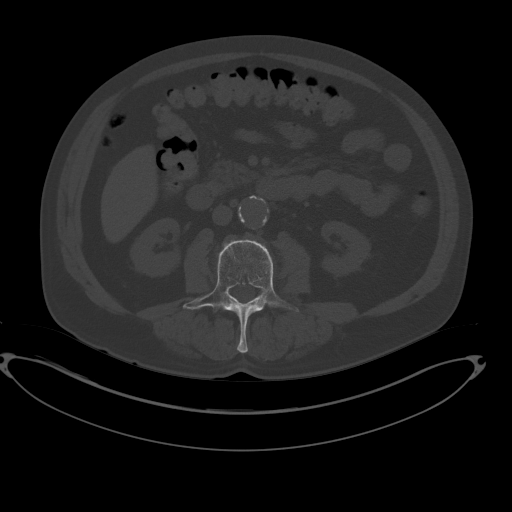
[im 67/92  soft-tissue]
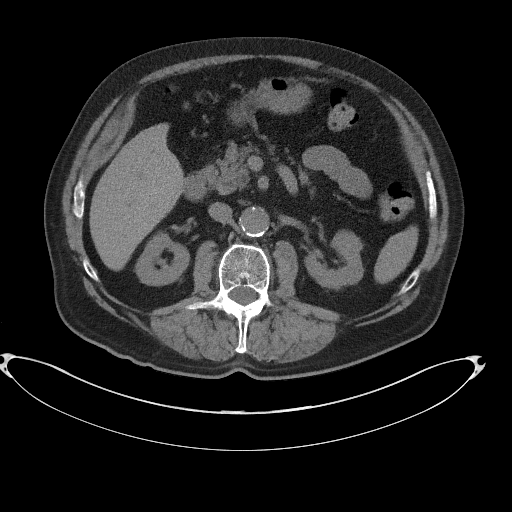
[im 74/92  soft-tissue]
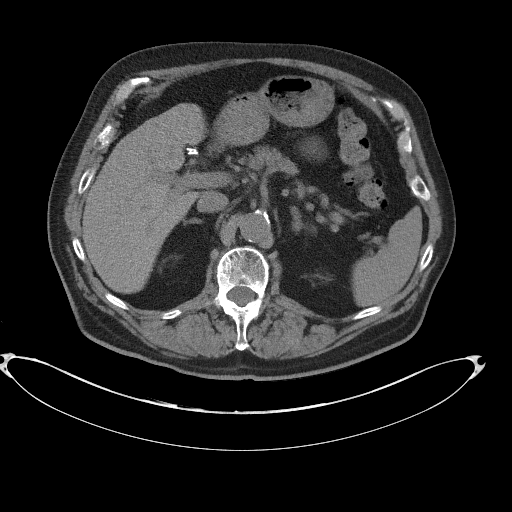
[im 81/92  soft-tissue]
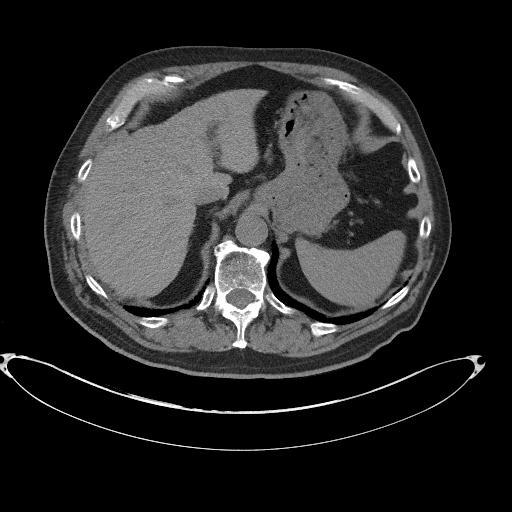
[im 88/92  soft-tissue]
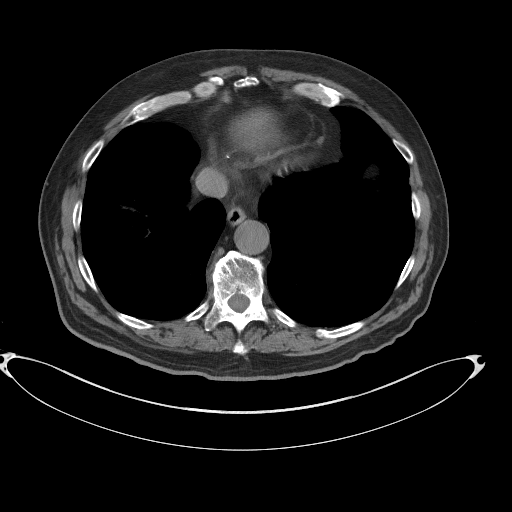

[Series 4: coronal st · coronal · 0.88mm/px · 3 of 124 slices shown]
[im 42/124  soft-tissue]
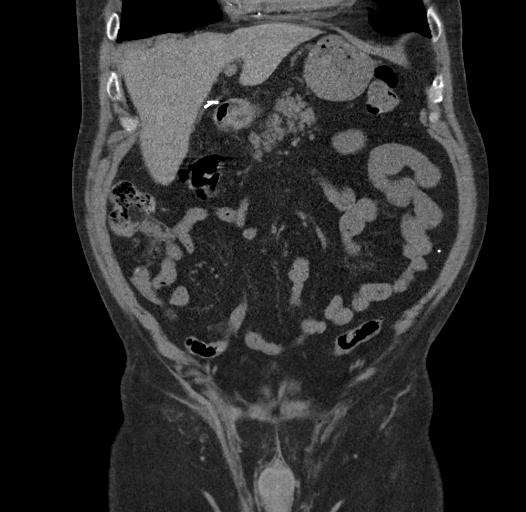
[im 55/124  soft-tissue]
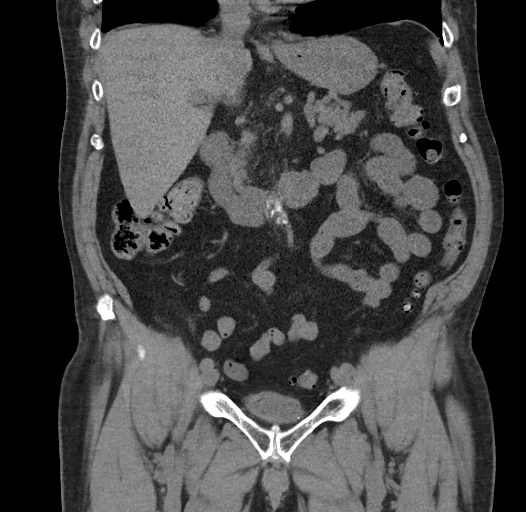
[im 69/124  soft-tissue]
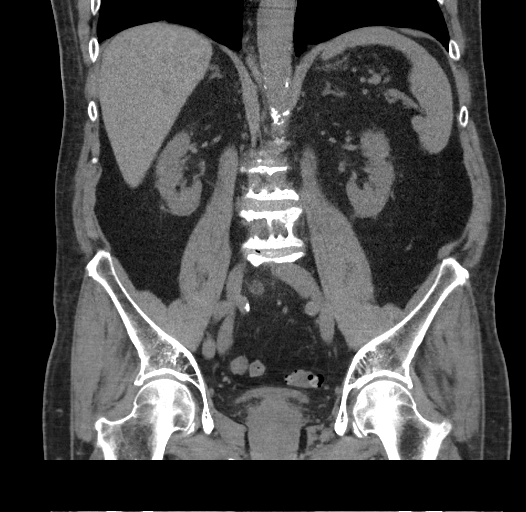

[16 of 46 positions shown; findings below may reference images not displayed]

FINDINGS: Lower chest: Small hiatal hernia is noted. The lung bases are
unremarkable.

Hepatobiliary: Unenhanced liver shows no biliary ductal dilatation.
Status postcholecystectomy.

Pancreas: Unenhanced pancreas is unremarkable.

Spleen: Unenhanced spleen is unremarkable.

Adrenals/Urinary Tract: No adrenal gland mass. Bilateral kidneys
shows a lobulated contour. There is mild cortical thinning
bilaterally. No nephrolithiasis. No hydronephrosis or hydroureter.
No calcified ureteral calculi are noted.

No calcified calculi are noted within empty urinary bladder.

Stomach/Bowel: There is no gastric outlet obstruction. No small
bowel obstruction. No thickened or dilated small bowel loops. Some
colonic stool noted in right colon and transverse colon. There is no
pericecal inflammation. The terminal ileum is unremarkable. The
appendix is not identified. Scattered diverticula are noted
descending colon. Multiple colonic diverticula are noted proximal
sigmoid colon. No evidence of acute diverticulitis or acute colitis.

Vascular/Lymphatic: Atherosclerotic calcifications of abdominal
aorta and iliac arteries are noted. There is aneurysmal dilatation
of distal abdominal aorta measures 3.5 by 3.4 cm in diameter. There
is a calcified old small dissection flap or mural calcification in
axial image 39. There is no evidence of surrounding inflammation or
aortic leak.

Reproductive: Probable postsurgical changes anterior aspect of the
prostate gland. Seminal vesicles are unremarkable. No pelvic mass is
noted. Limited examination without IV contrast.

Other: No ascites or free abdominal air. No retroperitoneal or
mesenteric adenopathy. No inguinal adenopathy.

Musculoskeletal: No destructive bony lesions are noted. Sagittal
images of the spine shows osteopenia and degenerative changes lumbar
spine. Degenerative changes bilateral SI joints. Degenerative
changes pubic symphysis.
IMPRESSION: 1. No evidence of nephrolithiasis. No hydronephrosis or hydroureter.
No calcified ureteral calculi are noted.
2. Bilateral renal lobulated contour and mild cortical thinning
probable due to atrophy.
3. No pericecal inflammation. Appendix is not identified. No small
bowel obstruction.
4. Colonic diverticula are noted descending colon proximal sigmoid
colon. No evidence of acute colitis or diverticulitis.
5. Limited assessment of urinary bladder which is empty. Bilateral
distal ureter is unremarkable. Question postsurgical changes
anterior aspect of prostate gland. Seminal vesicles are
unremarkable.
6. Osteopenia and degenerative changes lumbar spine.
7. Aneurysmal dilatation of distal abdominal aorta measures 3.5 x
3.4 cm. No evidence of acute complication. Recommend followup by
ultrasound in 2 years. This recommendation follows ACR consensus
guidelines: White Paper of the ACR Incidental Findings Committee II

## 2016-08-06 MED ORDER — TRAMADOL HCL 50 MG PO TABS: 50.0000 mg | ORAL_TABLET | Freq: Four times a day (QID) | ORAL | 0 refills | Status: DC | PRN

## 2016-08-06 NOTE — ED Notes (Signed)
MD at bedside. 

## 2016-08-06 NOTE — ED Notes (Signed)
PA student at bedside.

## 2016-08-06 NOTE — ED Provider Notes (Signed)
[facility] DEPT [facility] Provider Note   CSN: [id number] Arrival date & time: 08/06/16  1329     History   Chief Complaint Chief Complaint  Patient presents with  . Flank Pain    HPI Thomas Marshall is a 75 y.o. male.  Patient is a 75 year old male with past medical history of coronary artery disease, diabetes, and a renal artery aneurysm. He presents for evaluation of right flank pain that started 3 days ago in the absence of any injury or trauma. His pain is worse when he moves and relieved with rest. He also has a history of kidney stones, however this feels different. He denies any urinary complaints, fevers, or bowel complaints.   The history is provided by the patient.  Flank Pain  This is a new problem. Episode onset: 3 days ago. The problem occurs constantly. The problem has been gradually worsening. Pertinent negatives include no abdominal pain. Exacerbated by: Movements, change in position. Nothing relieves the symptoms. He has tried nothing for the symptoms.    Past Medical History:  Diagnosis Date  . AAA (abdominal aortic aneurysm) ([place])   . Arthritis   . Cancer (HCC)    skin & squamous cell  . Carotid artery disease ([place])    a. s/p R CEA 12/2015.  [name] CKD (chronic kidney disease), stage III    a. per historical labs.  . Coronary artery disease    a. s/p CABG 2004.  . Diabetes mellitus without complication ([place])   . GERD (gastroesophageal reflux disease)   . Hyperlipidemia   . Hypertension   . Medication intolerance Multiple   . PAF (paroxysmal atrial fibrillation) ([place])   . Pneumonia   . Sinus bradycardia    a. baseline HR 50s-60s, also h/o bradycardia on metoprolol and carvedilol    Patient Active Problem List   Diagnosis Date Noted  . Hypomagnesemia 05/22/2016  . Essential hypertension 04/21/2016  . CAD (coronary artery disease) 03/02/2016  . Neck abscess 01/10/2016  . Carotid artery disease without cerebral infarction ([place]) 11/15/2015  . Right facial  swelling 08/06/2015  . BPH (benign prostatic hypertrophy) 08/06/2015  . Abscess of left thigh 07/16/2015  . Cellulitis and abscess of leg 07/09/2015  . CKD (chronic kidney disease) stage 3, GFR 30-59 ml/min 05/14/2015  . Carotid stenosis 05/01/2015  . PVD (peripheral vascular disease) ([place]) 04/09/2015  . AAA (abdominal aortic aneurysm) ([place]) 04/09/2015  . S/P CABG x 4 2004 09/10/2014  . PAF (paroxysmal atrial fibrillation) ([place]) 09/04/2013  . Long term current use of anticoagulant therapy 09/04/2013  . Hx of squamous cell carcinoma of skin   . Type 2 diabetes mellitus with renal manifestations ([place])   . GERD (gastroesophageal reflux disease)   . Hyperlipidemia     Past Surgical History:  Procedure Laterality Date  . ANKLE SURGERY     right fused  . BACK SURGERY     low back   . CHOLECYSTECTOMY N/A 12/07/2013   Procedure: LAPAROSCOPIC CHOLECYSTECTOMY ;  Surgeon: [name], MD;  Location: [facility];  Service: General;  Laterality: N/A;  . COLONOSCOPY    . CORONARY ARTERY BYPASS GRAFT  2004   [place]  . ENDARTERECTOMY Right 12/10/2015   Procedure: Right Carotid ENDARTERECTOMY;  Surgeon: Conrad [place], MD;  Location: [facility];  Service: Vascular;  Laterality: Right;  . EXPLORATION POST OPERATIVE OPEN HEART    . FINGER SURGERY     skin graft on rt index  . FINGER SURGERY Right  right index finger.  [name] HERNIA REPAIR Right    [facility]  . JOINT REPLACEMENT    . KNEE SURGERY     replacement on both knees  . PROSTATE SURGERY    . TONSILLECTOMY    . URINARY SURGERY     scar tissue       Home Medications    Prior to Admission medications   Medication Sig Start Date End Date Taking? Authorizing Provider  aspirin 81 MG tablet Take 81 mg by mouth daily.    Historical Provider, MD  atorvastatin (LIPITOR) 40 MG tablet Take 40 mg by mouth daily.    Historical Provider, MD  cholecalciferol (VITAMIN D) 1000 UNITS tablet Take 1,000 Units by mouth daily.    Historical Provider, MD    cloNIDine (CATAPRES) 0.1 MG tablet TAKE ONE TABLET BY MOUTH TWICE DAILY 01/01/16   [name], MD  glucose blood test strip Use to test blood glucose level 2 times a day. 01/24/16   Virginia Crews, MD  magnesium oxide (MAG-OX) 400 MG tablet Take 1 tablet by mouth 4 times a day 05/08/16   [name], MD  metFORMIN (GLUCOPHAGE) 1000 MG tablet Take 1 tablet (1,000 mg total) by mouth 2 (two) times daily with a meal. 01/21/16   Virginia Crews, MD  nitroGLYCERIN (NITROSTAT) 0.4 MG SL tablet Place 1 tablet (0.4 mg total) under the tongue every 5 (five) minutes as needed for chest pain (x 3 doses). 05/22/16   Virginia Crews, MD  omeprazole (PRILOSEC) 20 MG capsule Take 20 mg by mouth daily.    Historical Provider, MD  spironolactone (ALDACTONE) 25 MG tablet Take 1 tablet (25 mg total) by mouth daily. 04/24/16   Dayna N Dunn, PA-C  terazosin (HYTRIN) 1 MG capsule Take 1 capsule (1 mg total) by mouth at bedtime. 04/02/16   Liliane Shi, PA-C  vitamin C (ASCORBIC ACID) 500 MG tablet Take 500 mg by mouth daily.    Historical Provider, MD  warfarin (COUMADIN) 10 MG tablet TAKE ONE TABLET BY MOUTH ONCE DAILY AS DIRECTED 03/11/16   [name], MD    Family History Family History  Problem Relation Age of Onset  . Stroke Mother   . Heart disease Mother   . Heart disease Father   . Hypertension Father   . Hyperlipidemia Father   . Cancer Sister     breast  and skin  . Diabetes Sister   . Hypertension Sister   . Diabetes Brother   . Heart disease Brother   . Hyperlipidemia Brother   . Hypertension Brother   . Early death Neg Hx     Social History Social History  Substance Use Topics  . Smoking status: Former Smoker    Packs/day: 1.00    Years: 13.00    Quit date: 11/30/1970  . Smokeless tobacco: Never Used  . Alcohol use No     Allergies   Lisinopril; Losartan; Nifedipine; Triamterene; Hydralazine hcl; Amoxicillin; Ampicillin; and Carvedilol   Review of Systems Review  of Systems  Gastrointestinal: Negative for abdominal pain.  Genitourinary: Positive for flank pain.  All other systems reviewed and are negative.    Physical Exam Updated Vital Signs BP 156/75   Pulse 77   Temp 97.7 F (36.5 C) (Oral)   Resp 18   Ht 6\' 2"  (1.88 m)   Wt 225 lb (102.1 kg)   SpO2 99%   BMI 28.89 kg/m   Physical Exam  Constitutional: He is  oriented to person, place, and time. He appears well-developed and well-nourished. No distress.  HENT:  Head: Normocephalic and atraumatic.  Mouth/Throat: Oropharynx is clear and moist.  Neck: Normal range of motion. Neck supple.  Cardiovascular: Normal rate and regular rhythm.  Exam reveals no friction rub.   No murmur heard. Pulmonary/Chest: Effort normal and breath sounds normal. No respiratory distress. He has no wheezes. He has no rales.  Abdominal: Soft. Bowel sounds are normal. He exhibits no distension. There is no tenderness.  There is tenderness to palpation in the right flank.  Musculoskeletal: Normal range of motion. He exhibits no edema.  Neurological: He is alert and oriented to person, place, and time. Coordination normal.  Skin: Skin is warm and dry. He is not diaphoretic.  Nursing note and vitals reviewed.    ED Treatments / Results  Labs (all labs ordered are listed, but only abnormal results are displayed) Labs Reviewed  URINALYSIS, ROUTINE W REFLEX MICROSCOPIC (NOT AT [facility]) - Abnormal; Notable for the following:       Result Value   Ketones, ur 15 (*)    All other components within normal limits  CBC  COMPREHENSIVE METABOLIC PANEL    EKG  EKG Interpretation None       Radiology No results found.  Procedures Procedures (including critical care time)  Medications Ordered in ED Medications - No data to display   Initial Impression / Assessment and Plan / ED Course  I have reviewed the triage vital signs and the nursing notes.  Pertinent labs & imaging results that were available  during my care of the patient were reviewed by me and considered in my medical decision making (see chart for details).  Clinical Course    Patient with history of aortic aneurysm presents with complaints of flank pain. He also has a history of kidney stones, however this feels different. His urinalysis is clear, laboratory studies are unremarkable, and CT scan reveals no evidence for renal calculus or AAA complication. He will be discharged with NSAIDs, tramadol, and when necessary return.  Final Clinical Impressions(s) / ED Diagnoses   Final diagnoses:  None    New Prescriptions New Prescriptions   No medications on file     Veryl Speak, MD 08/06/16 [phone]

## 2016-08-06 NOTE — ED Triage Notes (Signed)
Right flank pain x 3 days. No injury. He has a hx of kidney stones but this pain does not feel the same.

## 2016-08-06 NOTE — ED Notes (Signed)
Patient transported to CT 

## 2016-08-06 NOTE — ED Notes (Signed)
R flank pain, non radiating x 3 days. States ibuprofen helped the first 2 days but not today. Took ibuprofen at 9 this morning. Denies N/V, denies urinary symptoms.

## 2016-08-06 NOTE — Discharge Instructions (Signed)
Ibuprofen 600 mg every 6 hours as needed for pain.  Tramadol as prescribed as needed for pain not relieved with ibuprofen.  Follow-up with your primary Dr. if not improving in the next few days, and return to the ER if symptoms significantly worsen or change.

## 2016-08-17 ENCOUNTER — Ambulatory Visit (INDEPENDENT_AMBULATORY_CARE_PROVIDER_SITE_OTHER): Payer: Medicare Other | Admitting: Pharmacist

## 2016-08-17 DIAGNOSIS — I480 Paroxysmal atrial fibrillation: Secondary | ICD-10-CM

## 2016-08-17 DIAGNOSIS — Z7901 Long term (current) use of anticoagulants: Secondary | ICD-10-CM | POA: Diagnosis not present

## 2016-08-17 DIAGNOSIS — I4891 Unspecified atrial fibrillation: Secondary | ICD-10-CM

## 2016-08-17 LAB — POCT INR: INR: 2

## 2016-08-25 ENCOUNTER — Encounter: Payer: Self-pay | Admitting: Podiatry

## 2016-08-25 ENCOUNTER — Ambulatory Visit (INDEPENDENT_AMBULATORY_CARE_PROVIDER_SITE_OTHER): Payer: Medicare Other | Admitting: Podiatry

## 2016-08-25 DIAGNOSIS — L84 Corns and callosities: Secondary | ICD-10-CM | POA: Diagnosis not present

## 2016-08-25 DIAGNOSIS — E1140 Type 2 diabetes mellitus with diabetic neuropathy, unspecified: Secondary | ICD-10-CM

## 2016-08-25 DIAGNOSIS — M79675 Pain in left toe(s): Secondary | ICD-10-CM | POA: Diagnosis not present

## 2016-08-25 DIAGNOSIS — B351 Tinea unguium: Secondary | ICD-10-CM

## 2016-08-25 DIAGNOSIS — M79674 Pain in right toe(s): Secondary | ICD-10-CM | POA: Diagnosis not present

## 2016-08-25 NOTE — Progress Notes (Signed)
Patient ID: ITHIEL LIEBLER, male   DOB: Nov 06, 1941, 75 y.o.   MRN: [id number]  Subjective: 75 y.o. returns the office today for painful, elongated, thickened toenails which he cannot trim himself. He also has a painful callus on the left foot that needs to be trimmed. Denies any redness or drainage around the nails/callus. Denies any acute changes since last appointment and no new complaints today. Denies any systemic complaints such as fevers, chills, nausea, vomiting.   Objective: AAO 3, NAD DP/PT pulses palpable, CRT less than 3 seconds Protective sensation decreased with Simms Weinstein monofilament Nails hypertrophic, dystrophic, elongated, brittle, discolored 10. There is tenderness overlying the nails 1-5 bilaterally. There is no surrounding erythema or drainage along the nail sites. Hyperkeratotic lesion right fifth metatarsal base. Upon debridement no underlying ulceration, drainage or other signs of infection. No open lesions or pre-ulcerative lesions are identified. No other areas of tenderness bilateral lower extremities. No overlying edema, erythema, increased warmth. No pain with calf compression, swelling, warmth, erythema.  Assessment: Patient presents with symptomatic onychomycosis; hyperkeratotic lesion  Plan: -Treatment options including alternatives, risks, complications were discussed -Nails sharply debrided 10 without complication/bleeding. -Hyperkeratotic lesion debrided 1 without complications or bleeding. -Discussed daily foot inspection. If there are any changes, to call the office immediately.  -Follow-up in 3 months or sooner if any problems are to arise. In the meantime, encouraged to call the office with any questions, concerns, changes symptoms.  Celesta Gentile, DPM

## 2016-09-14 ENCOUNTER — Ambulatory Visit (INDEPENDENT_AMBULATORY_CARE_PROVIDER_SITE_OTHER): Payer: Medicare Other | Admitting: *Deleted

## 2016-09-14 DIAGNOSIS — I480 Paroxysmal atrial fibrillation: Secondary | ICD-10-CM | POA: Diagnosis not present

## 2016-09-14 DIAGNOSIS — Z7901 Long term (current) use of anticoagulants: Secondary | ICD-10-CM

## 2016-09-14 DIAGNOSIS — I4891 Unspecified atrial fibrillation: Secondary | ICD-10-CM | POA: Diagnosis not present

## 2016-09-14 LAB — POCT INR: INR: 2.4

## 2016-09-15 ENCOUNTER — Ambulatory Visit: Payer: Medicare Other | Admitting: Internal Medicine

## 2016-09-16 ENCOUNTER — Encounter: Payer: Self-pay | Admitting: Internal Medicine

## 2016-09-16 ENCOUNTER — Ambulatory Visit (INDEPENDENT_AMBULATORY_CARE_PROVIDER_SITE_OTHER): Payer: Medicare Other | Admitting: Internal Medicine

## 2016-09-16 ENCOUNTER — Other Ambulatory Visit (INDEPENDENT_AMBULATORY_CARE_PROVIDER_SITE_OTHER): Payer: Medicare Other

## 2016-09-16 VITALS — BP 124/80 | HR 92 | Temp 98.0°F | Resp 20 | Wt 230.5 lb

## 2016-09-16 DIAGNOSIS — E1122 Type 2 diabetes mellitus with diabetic chronic kidney disease: Secondary | ICD-10-CM

## 2016-09-16 DIAGNOSIS — I2510 Atherosclerotic heart disease of native coronary artery without angina pectoris: Secondary | ICD-10-CM | POA: Diagnosis not present

## 2016-09-16 DIAGNOSIS — N529 Male erectile dysfunction, unspecified: Secondary | ICD-10-CM | POA: Diagnosis not present

## 2016-09-16 DIAGNOSIS — I10 Essential (primary) hypertension: Secondary | ICD-10-CM

## 2016-09-16 DIAGNOSIS — N182 Chronic kidney disease, stage 2 (mild): Secondary | ICD-10-CM

## 2016-09-16 LAB — BASIC METABOLIC PANEL
BUN: 24 mg/dL — ABNORMAL HIGH (ref 6–23)
CALCIUM: 8.9 mg/dL (ref 8.4–10.5)
CHLORIDE: 104 meq/L (ref 96–112)
CO2: 30 meq/L (ref 19–32)
Creatinine, Ser: 1.49 mg/dL (ref 0.40–1.50)
GFR: 48.58 mL/min — ABNORMAL LOW (ref >60.00)
Glucose, Bld: 124 mg/dL — ABNORMAL HIGH (ref 70–99)
Potassium: 4.4 mEq/L (ref 3.5–5.1)
SODIUM: 140 meq/L (ref 135–145)

## 2016-09-16 LAB — LIPID PANEL
CHOL/HDL RATIO: 4
Cholesterol: 127 mg/dL (ref 0–200)
HDL: 34 mg/dL — AB (ref >39.00)
NONHDL: 93.16
Triglycerides: 209 mg/dL — ABNORMAL HIGH (ref 0.0–149.0)
VLDL: 41.8 mg/dL — ABNORMAL HIGH (ref 0.0–40.0)

## 2016-09-16 LAB — MAGNESIUM: MAGNESIUM: 1.8 mg/dL (ref 1.5–2.5)

## 2016-09-16 LAB — HEPATIC FUNCTION PANEL
ALK PHOS: 76 U/L (ref 39–117)
ALT: 18 U/L (ref 0–53)
AST: 16 U/L (ref 0–37)
Albumin: 4 g/dL (ref 3.5–5.2)
BILIRUBIN DIRECT: 0.1 mg/dL (ref 0.0–0.3)
BILIRUBIN TOTAL: 0.5 mg/dL (ref 0.2–1.2)
TOTAL PROTEIN: 6.8 g/dL (ref 6.0–8.3)

## 2016-09-16 LAB — LDL CHOLESTEROL, DIRECT: Direct LDL: 67 mg/dL

## 2016-09-16 LAB — HEMOGLOBIN A1C: Hgb A1c MFr Bld: 8.1 % — ABNORMAL HIGH (ref 4.6–6.5)

## 2016-09-16 MED ORDER — SILDENAFIL CITRATE 100 MG PO TABS: 50.0000 mg | ORAL_TABLET | Freq: Every day | ORAL | 11 refills | Status: DC | PRN

## 2016-09-16 MED ORDER — METFORMIN HCL 1000 MG PO TABS: 1000.0000 mg | ORAL_TABLET | Freq: Two times a day (BID) | ORAL | 3 refills | Status: DC

## 2016-09-16 NOTE — Patient Instructions (Signed)
Please take all new medication as prescribed - the viagra  Please continue all other medications as before, and refills have been done if requested.  Please have the pharmacy call with any other refills you may need.  Please continue your efforts at being more active, low cholesterol diet, and weight control.  Please keep your appointments with your specialists as you may have planned  Please go to the LAB in the Basement (turn left off the elevator) for the tests to be done today  You will be contacted by phone if any changes need to be made immediately.  Otherwise, you will receive a letter about your results with an explanation, but please check with MyChart first.  Please remember to sign up for MyChart if you have not done so, as this will be important to you in the future with finding out test results, communicating by private email, and scheduling acute appointments online when needed.  Please return in 6 months, or sooner if needed

## 2016-09-16 NOTE — Progress Notes (Signed)
Subjective:    Patient ID: Thomas Marshall, male    DOB: 01/17/1941, 75 y.o.   MRN: [id number]  HPI    Here to establish as new pt, overall doing ok,  Pt denies chest pain, increasing sob or doe, wheezing, orthopnea, PND, increased LE swelling, palpitations, dizziness or syncope.  Pt denies new neurological symptoms such as new headache, or facial or extremity weakness or numbness.  Pt denies polydipsia, polyuria, or low sugar episode.   Pt denies new neurological symptoms such as new headache, or facial or extremity weakness or numbness.   Pt states overall good compliance with meds, mostly trying to follow appropriate diet, with wt overall stable,  but little exercise however. Has been taking the metformin at 1000 mg qhs only as he states was recommended after recent better a1c.  To f/u with Dr Michaela Corner surgury in dec 2017.  Also sees Dr Rhoderick Moody, also seen per [facility] opthalmology about April 2107.    Past Medical History:  Diagnosis Date  . AAA (abdominal aortic aneurysm) ([place])   . Arthritis   . Cancer (HCC)    skin & squamous cell  . Carotid artery disease ([place])    a. s/p R CEA 12/2015.  [name] CKD (chronic kidney disease), stage III    a. per historical labs.  . Coronary artery disease    a. s/p CABG 2004.  . Diabetes mellitus without complication ([place])   . GERD (gastroesophageal reflux disease)   . Hyperlipidemia   . Hypertension   . Medication intolerance Multiple   . PAF (paroxysmal atrial fibrillation) ([place])   . Pneumonia   . Sinus bradycardia    a. baseline HR 50s-60s, also h/o bradycardia on metoprolol and carvedilol   Past Surgical History:  Procedure Laterality Date  . ANKLE SURGERY     right fused  . BACK SURGERY     low back   . CHOLECYSTECTOMY N/A 12/07/2013   Procedure: LAPAROSCOPIC CHOLECYSTECTOMY ;  Surgeon: [name], MD;  Location: [facility];  Service: General;  Laterality: N/A;  . COLONOSCOPY    . CORONARY ARTERY BYPASS GRAFT  2004   [place]  . ENDARTERECTOMY  Right 12/10/2015   Procedure: Right Carotid ENDARTERECTOMY;  Surgeon: Conrad [place], MD;  Location: [facility];  Service: Vascular;  Laterality: Right;  . EXPLORATION POST OPERATIVE OPEN HEART    . FINGER SURGERY     skin graft on rt index  . FINGER SURGERY Right    right index finger.  [name] HERNIA REPAIR Right    RIH  . JOINT REPLACEMENT    . KNEE SURGERY     replacement on both knees  . PROSTATE SURGERY    . TONSILLECTOMY    . URINARY SURGERY     scar tissue    reports that he quit smoking about 45 years ago. He has a 13.00 pack-year smoking history. He has never used smokeless tobacco. He reports that he does not drink alcohol or use drugs. family history includes Cancer in his sister; Diabetes in his brother and sister; Heart disease in his brother, father, and mother; Hyperlipidemia in his brother and father; Hypertension in his brother, father, and sister; Stroke in his mother. Allergies  Allergen Reactions  . Lisinopril Swelling and Other (See Comments)    Angioedema.   . Losartan Swelling and Other (See Comments)    Angioedema   . Nifedipine Swelling    Sugar increase  . Triamterene Swelling  . Hydralazine Hcl  Other (See Comments)    Fatigue; poor appetite  . Amoxicillin Rash  . Ampicillin Rash  . Carvedilol Other (See Comments)    Low heart rate    Current Outpatient Prescriptions on File Prior to Visit  Medication Sig Dispense Refill  . aspirin 81 MG tablet Take 81 mg by mouth daily.    [name] atorvastatin (LIPITOR) 40 MG tablet Take 40 mg by mouth daily.    . cholecalciferol (VITAMIN D) 1000 UNITS tablet Take 1,000 Units by mouth daily.    . cloNIDine (CATAPRES) 0.1 MG tablet TAKE ONE TABLET BY MOUTH TWICE DAILY 180 tablet 2  . glucose blood test strip Use to test blood glucose level 2 times a day. 100 each 12  . magnesium oxide (MAG-OX) 400 MG tablet Take 1 tablet by mouth 4 times a day 120 tablet 2  . nitroGLYCERIN (NITROSTAT) 0.4 MG SL tablet Place 1 tablet (0.4 mg total)  under the tongue every 5 (five) minutes as needed for chest pain (x 3 doses). 30 tablet 3  . omeprazole (PRILOSEC) 20 MG capsule Take 20 mg by mouth daily.    [name] spironolactone (ALDACTONE) 25 MG tablet Take 1 tablet (25 mg total) by mouth daily. 90 tablet 1  . terazosin (HYTRIN) 1 MG capsule Take 1 capsule (1 mg total) by mouth at bedtime. 30 capsule 11  . traMADol (ULTRAM) 50 MG tablet Take 1 tablet (50 mg total) by mouth every 6 (six) hours as needed. 15 tablet 0  . vitamin C (ASCORBIC ACID) 500 MG tablet Take 500 mg by mouth daily.    [name] warfarin (COUMADIN) 10 MG tablet TAKE ONE TABLET BY MOUTH ONCE DAILY AS DIRECTED 100 tablet 1   No current facility-administered medications on file prior to visit.     Review of Systems  Constitutional: Negative for unusual diaphoresis or night sweats HENT: Negative for ear swelling or discharge Eyes: Negative for worsening visual haziness  Respiratory: Negative for choking and stridor.   Gastrointestinal: Negative for distension or worsening eructation Genitourinary: Negative for retention or change in urine volume.  Musculoskeletal: Negative for other MSK pain or swelling Skin: Negative for color change and worsening wound Neurological: Negative for tremors and numbness other than noted  Psychiatric/Behavioral: Negative for decreased concentration or agitation other than above   All o/w neg per pt    Objective:   Physical Exam BP 124/80   Pulse 92   Temp 98 F (36.7 C) (Oral)   Resp 20   Wt 230 lb 8 oz (104.6 kg)   SpO2 97%   BMI 29.59 kg/m  VS noted, not ill appearing Constitutional: Pt appears in no apparent distress HENT: Head: NCAT.  Right Ear: External ear normal.  Left Ear: External ear normal.  Eyes: . Pupils are equal, round, and reactive to light. Conjunctivae and EOM are normal Neck: Normal range of motion. Neck supple.  Cardiovascular: Normal rate and regular rhythm.   Pulmonary/Chest: Effort normal and breath sounds without  rales or wheezing.  Abd:  Soft, NT, ND, + BS Neurological: Pt is alert. Not confused , motor grossly intact Skin: Skin is warm. No rash, no LE edema Psychiatric: Pt behavior is normal. No agitation.     Assessment & Plan:

## 2016-09-16 NOTE — Progress Notes (Signed)
Pre visit review using our clinic review tool, if applicable. No additional management support is needed unless otherwise documented below in the visit note. 

## 2016-09-20 NOTE — Assessment & Plan Note (Signed)
stable overall by history and exam, recent data reviewed with pt, and pt to continue medical treatment as before,  to f/u any worsening symptoms or concerns BP Readings from Last 3 Encounters:  09/16/16 124/80  08/06/16 156/75  07/03/16 140/60

## 2016-09-20 NOTE — Assessment & Plan Note (Signed)
stable overall by history and exam, recent data reviewed with pt, and pt to continue medical treatment as before,  to f/u any worsening symptoms or concerns Lab Results  Component Value Date   HGBA1C 8.1 (H) 09/16/2016

## 2016-09-20 NOTE — Assessment & Plan Note (Signed)
Mild to mod, for viagra prn,  to f/u any worsening symptoms or concerns

## 2016-09-20 NOTE — Assessment & Plan Note (Signed)
Mild, for f/u lab,  to f/u any worsening symptoms or concerns 

## 2016-09-25 ENCOUNTER — Encounter (HOSPITAL_COMMUNITY): Payer: 59

## 2016-09-25 ENCOUNTER — Ambulatory Visit: Payer: 59 | Admitting: Vascular Surgery

## 2016-09-26 ENCOUNTER — Other Ambulatory Visit: Payer: Self-pay | Admitting: Cardiovascular Disease

## 2016-09-27 ENCOUNTER — Inpatient Hospital Stay (HOSPITAL_COMMUNITY)
Admission: EM | Admit: 2016-09-27 | Discharge: 2016-09-30 | DRG: 310 | Disposition: A | Discharge status: Home / Self Care | Payer: Medicare Other | Attending: Cardiovascular Disease | Admitting: Cardiovascular Disease

## 2016-09-27 ENCOUNTER — Emergency Department (HOSPITAL_COMMUNITY): Payer: Medicare Other

## 2016-09-27 ENCOUNTER — Encounter (HOSPITAL_COMMUNITY): Payer: Self-pay

## 2016-09-27 ENCOUNTER — Telehealth: Payer: Self-pay | Admitting: Physician Assistant

## 2016-09-27 DIAGNOSIS — I4819 Other persistent atrial fibrillation: Secondary | ICD-10-CM | POA: Insufficient documentation

## 2016-09-27 DIAGNOSIS — Z803 Family history of malignant neoplasm of breast: Secondary | ICD-10-CM | POA: Diagnosis not present

## 2016-09-27 DIAGNOSIS — Z833 Family history of diabetes mellitus: Secondary | ICD-10-CM | POA: Diagnosis not present

## 2016-09-27 DIAGNOSIS — Z951 Presence of aortocoronary bypass graft: Secondary | ICD-10-CM

## 2016-09-27 DIAGNOSIS — E1122 Type 2 diabetes mellitus with diabetic chronic kidney disease: Secondary | ICD-10-CM | POA: Diagnosis not present

## 2016-09-27 DIAGNOSIS — Z96653 Presence of artificial knee joint, bilateral: Secondary | ICD-10-CM | POA: Diagnosis present

## 2016-09-27 DIAGNOSIS — Z888 Allergy status to other drugs, medicaments and biological substances status: Secondary | ICD-10-CM

## 2016-09-27 DIAGNOSIS — I4891 Unspecified atrial fibrillation: Secondary | ICD-10-CM | POA: Diagnosis not present

## 2016-09-27 DIAGNOSIS — Z87891 Personal history of nicotine dependence: Secondary | ICD-10-CM | POA: Diagnosis not present

## 2016-09-27 DIAGNOSIS — Z7984 Long term (current) use of oral hypoglycemic drugs: Secondary | ICD-10-CM | POA: Diagnosis not present

## 2016-09-27 DIAGNOSIS — Z823 Family history of stroke: Secondary | ICD-10-CM | POA: Diagnosis not present

## 2016-09-27 DIAGNOSIS — E875 Hyperkalemia: Secondary | ICD-10-CM | POA: Diagnosis not present

## 2016-09-27 DIAGNOSIS — I481 Persistent atrial fibrillation: Secondary | ICD-10-CM | POA: Diagnosis not present

## 2016-09-27 DIAGNOSIS — E785 Hyperlipidemia, unspecified: Secondary | ICD-10-CM | POA: Diagnosis not present

## 2016-09-27 DIAGNOSIS — Z8249 Family history of ischemic heart disease and other diseases of the circulatory system: Secondary | ICD-10-CM

## 2016-09-27 DIAGNOSIS — G4733 Obstructive sleep apnea (adult) (pediatric): Secondary | ICD-10-CM | POA: Diagnosis present

## 2016-09-27 DIAGNOSIS — N183 Chronic kidney disease, stage 3 unspecified: Secondary | ICD-10-CM | POA: Diagnosis present

## 2016-09-27 DIAGNOSIS — I2510 Atherosclerotic heart disease of native coronary artery without angina pectoris: Secondary | ICD-10-CM | POA: Diagnosis not present

## 2016-09-27 DIAGNOSIS — Z7982 Long term (current) use of aspirin: Secondary | ICD-10-CM | POA: Diagnosis not present

## 2016-09-27 DIAGNOSIS — Z8673 Personal history of transient ischemic attack (TIA), and cerebral infarction without residual deficits: Secondary | ICD-10-CM | POA: Diagnosis not present

## 2016-09-27 DIAGNOSIS — Z7901 Long term (current) use of anticoagulants: Secondary | ICD-10-CM

## 2016-09-27 DIAGNOSIS — I4892 Unspecified atrial flutter: Secondary | ICD-10-CM | POA: Diagnosis present

## 2016-09-27 DIAGNOSIS — K219 Gastro-esophageal reflux disease without esophagitis: Secondary | ICD-10-CM | POA: Diagnosis present

## 2016-09-27 DIAGNOSIS — I480 Paroxysmal atrial fibrillation: Secondary | ICD-10-CM | POA: Diagnosis present

## 2016-09-27 DIAGNOSIS — N189 Chronic kidney disease, unspecified: Secondary | ICD-10-CM | POA: Diagnosis not present

## 2016-09-27 DIAGNOSIS — Z880 Allergy status to penicillin: Secondary | ICD-10-CM

## 2016-09-27 DIAGNOSIS — E7800 Pure hypercholesterolemia, unspecified: Secondary | ICD-10-CM

## 2016-09-27 DIAGNOSIS — I10 Essential (primary) hypertension: Secondary | ICD-10-CM | POA: Diagnosis not present

## 2016-09-27 DIAGNOSIS — Z79899 Other long term (current) drug therapy: Secondary | ICD-10-CM

## 2016-09-27 LAB — BASIC METABOLIC PANEL
ANION GAP: 7 (ref 5–15)
Anion gap: 7 (ref 5–15)
BUN: 20 mg/dL (ref 6–20)
BUN: 21 mg/dL — ABNORMAL HIGH (ref 6–20)
CALCIUM: 9 mg/dL (ref 8.9–10.3)
CHLORIDE: 104 mmol/L (ref 101–111)
CO2: 23 mmol/L (ref 22–32)
CO2: 29 mmol/L (ref 22–32)
CREATININE: 1.69 mg/dL — AB (ref 0.61–1.24)
Calcium: 8.6 mg/dL — ABNORMAL LOW (ref 8.9–10.3)
Chloride: 103 mmol/L (ref 101–111)
Creatinine, Ser: 1.6 mg/dL — ABNORMAL HIGH (ref 0.61–1.24)
GFR calc non Af Amer: 37 mL/min — ABNORMAL LOW (ref >60)
GFR, EST AFRICAN AMERICAN: 43 mL/min — AB (ref >60)
GFR, EST AFRICAN AMERICAN: 46 mL/min — AB (ref >60)
GFR, EST NON AFRICAN AMERICAN: 40 mL/min — AB (ref >60)
Glucose, Bld: 282 mg/dL — ABNORMAL HIGH (ref 65–99)
Glucose, Bld: 110 mg/dL — ABNORMAL HIGH (ref 65–99)
Potassium: 4.5 mmol/L (ref 3.5–5.1)
Potassium: 4.8 mmol/L (ref 3.5–5.1)
SODIUM: 134 mmol/L — AB (ref 135–145)
Sodium: 139 mmol/L (ref 135–145)

## 2016-09-27 LAB — GLUCOSE, CAPILLARY: Glucose-Capillary: 115 mg/dL — ABNORMAL HIGH (ref 65–99)

## 2016-09-27 LAB — CBC
HCT: 39.8 % (ref 39.0–52.0)
Hemoglobin: 13.1 g/dL (ref 13.0–17.0)
MCH: 30.2 pg (ref 26.0–34.0)
MCHC: 32.9 g/dL (ref 30.0–36.0)
MCV: 91.7 fL (ref 78.0–100.0)
PLATELETS: 125 10*3/uL — AB (ref 150–400)
RBC: 4.34 MIL/uL (ref 4.22–5.81)
RDW: 13.5 % (ref 11.5–15.5)
WBC: 5.3 10*3/uL (ref 4.0–10.5)

## 2016-09-27 LAB — PROTIME-INR
INR: 2.58
Prothrombin Time: 28.2 seconds — ABNORMAL HIGH (ref 11.4–15.2)

## 2016-09-27 LAB — MAGNESIUM
MAGNESIUM: 1.9 mg/dL (ref 1.7–2.4)
Magnesium: 1.9 mg/dL (ref 1.7–2.4)

## 2016-09-27 IMAGING — CR DG CHEST 1V PORT
3 series · 3 of 3 positions shown · non-contrast
Comparison: [DATE]

CLINICAL DATA: Atrial fibrillation, dizziness, fatigue

EXAM:
PORTABLE CHEST 1 VIEW

[AP (1 of 3)]
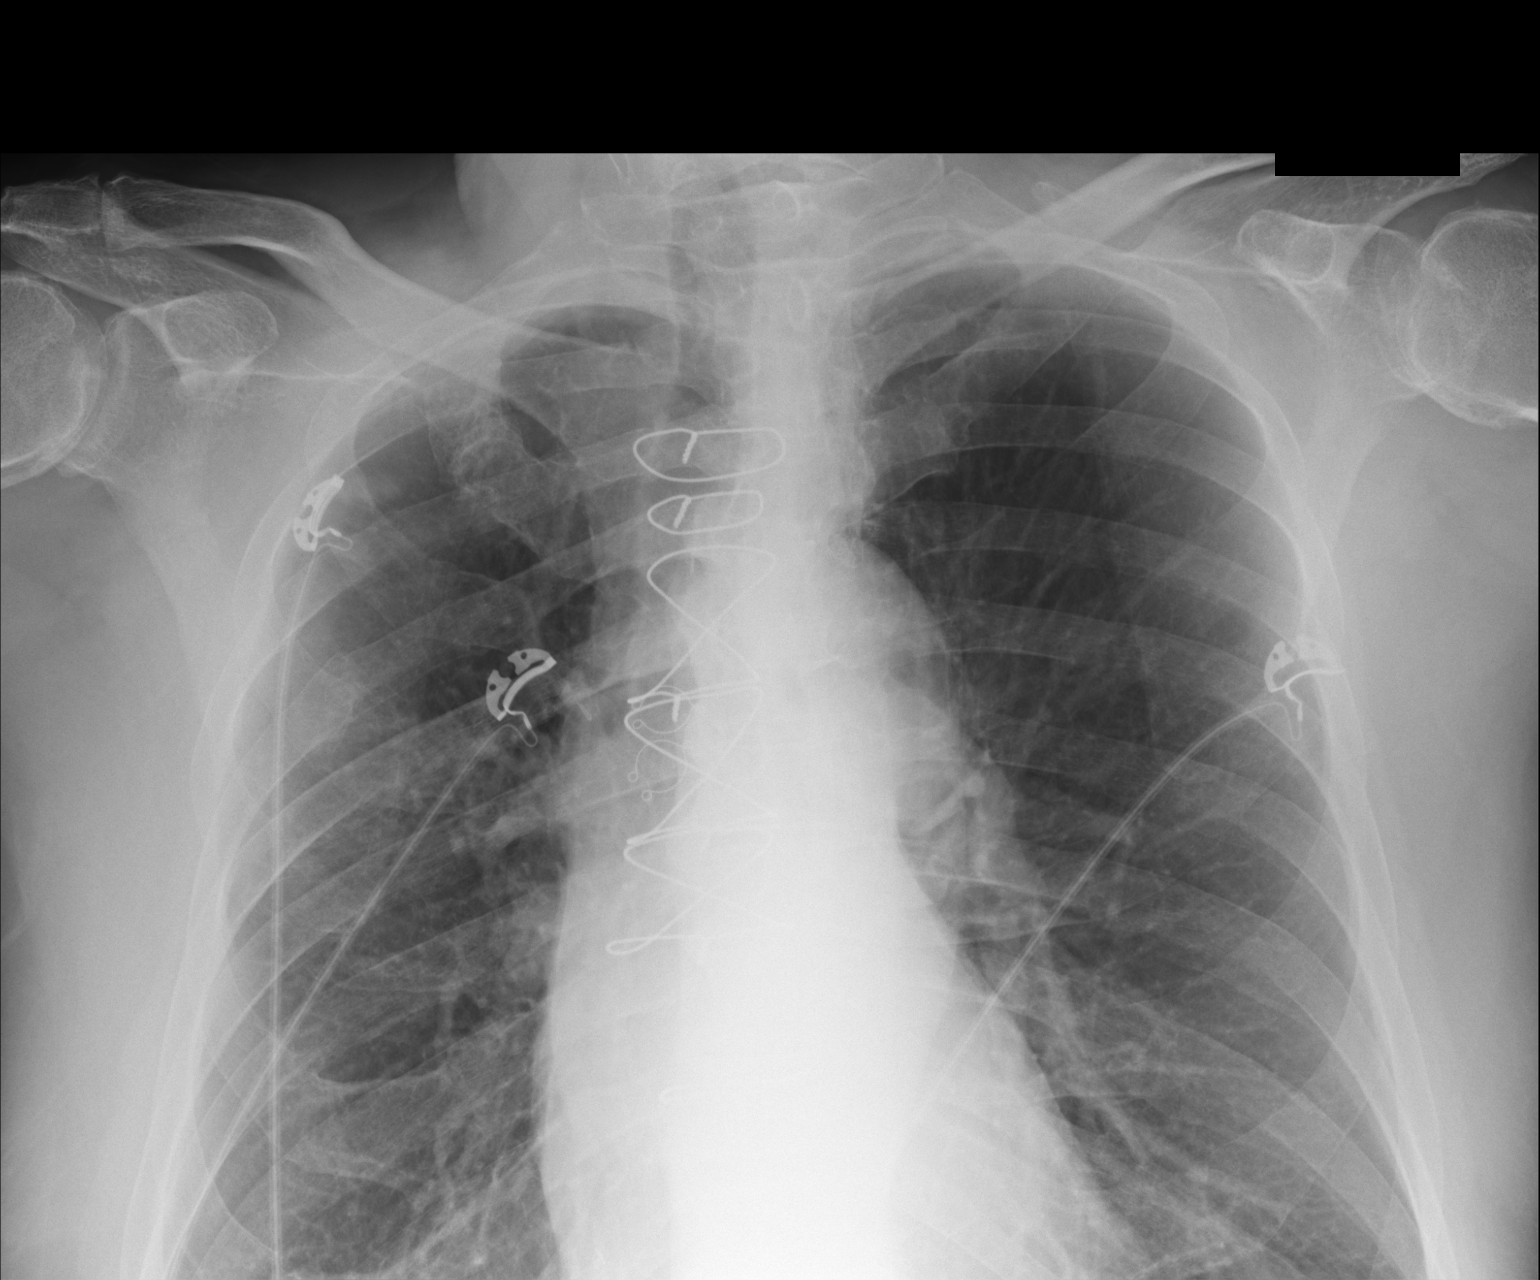

[AP (2 of 3)]
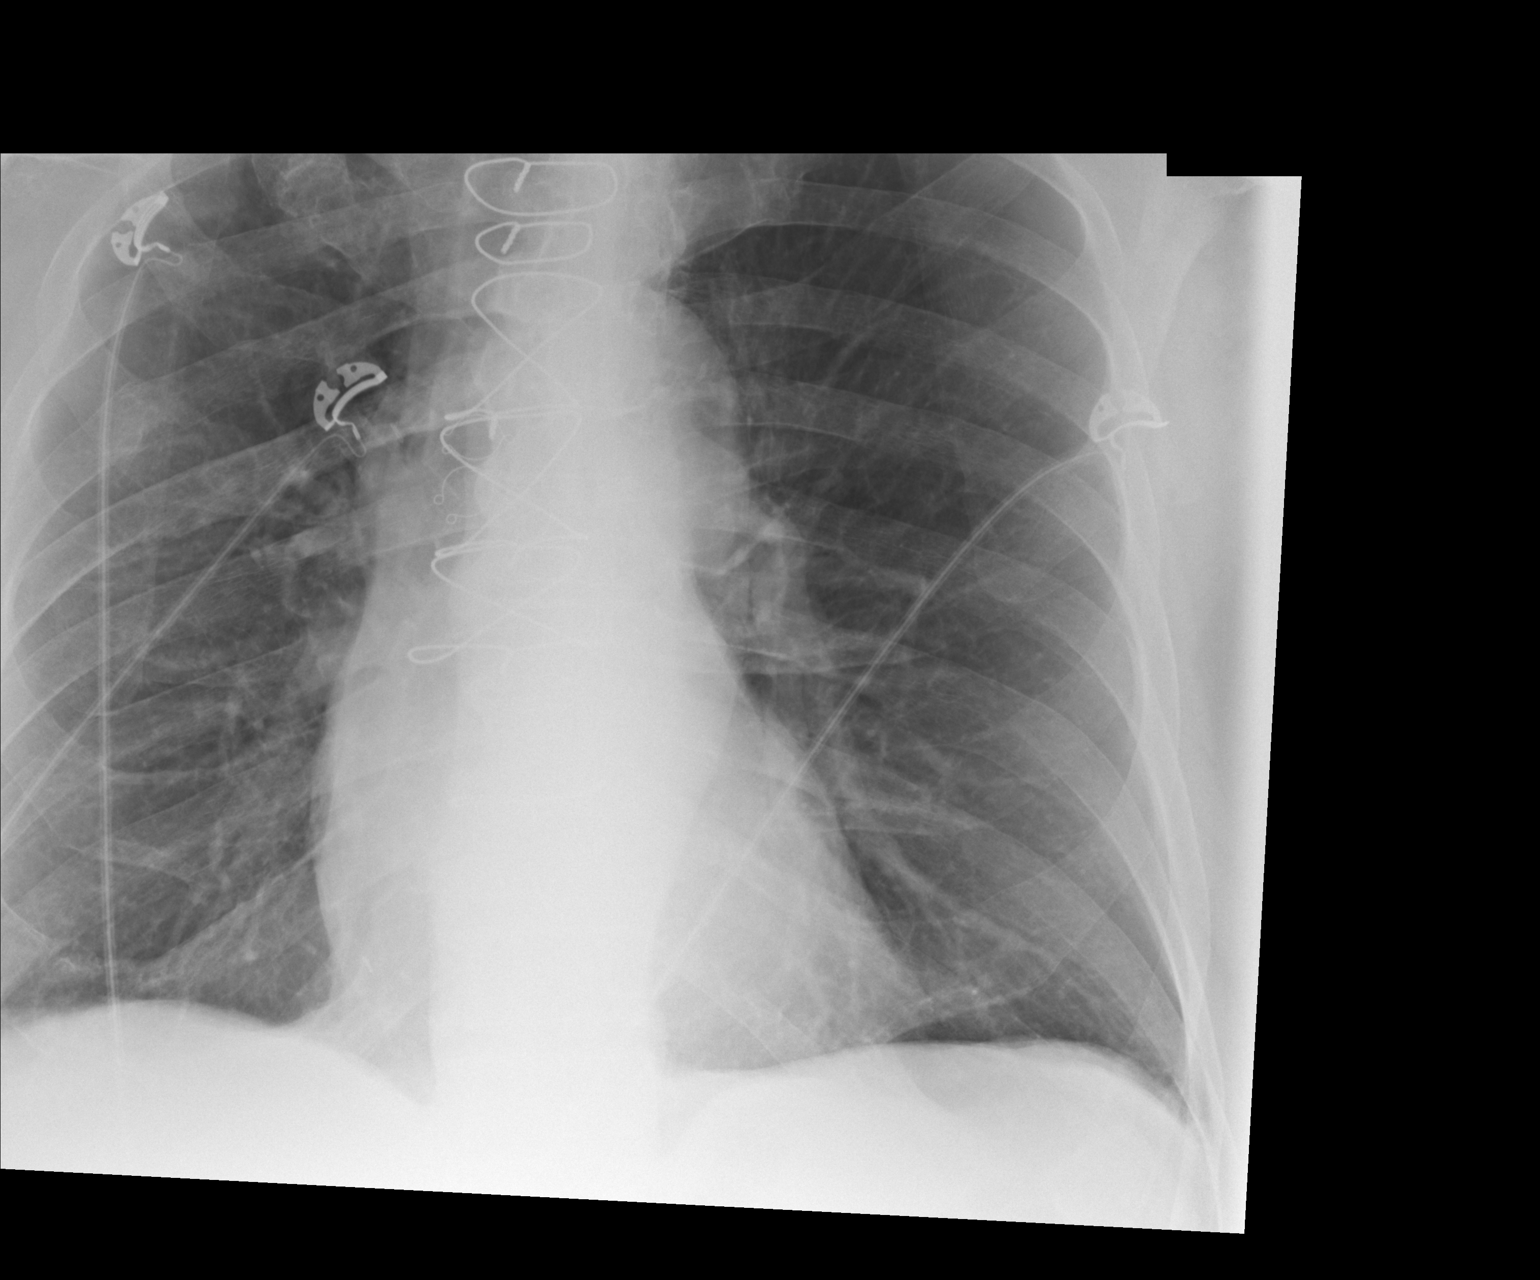

[AP (3 of 3)]
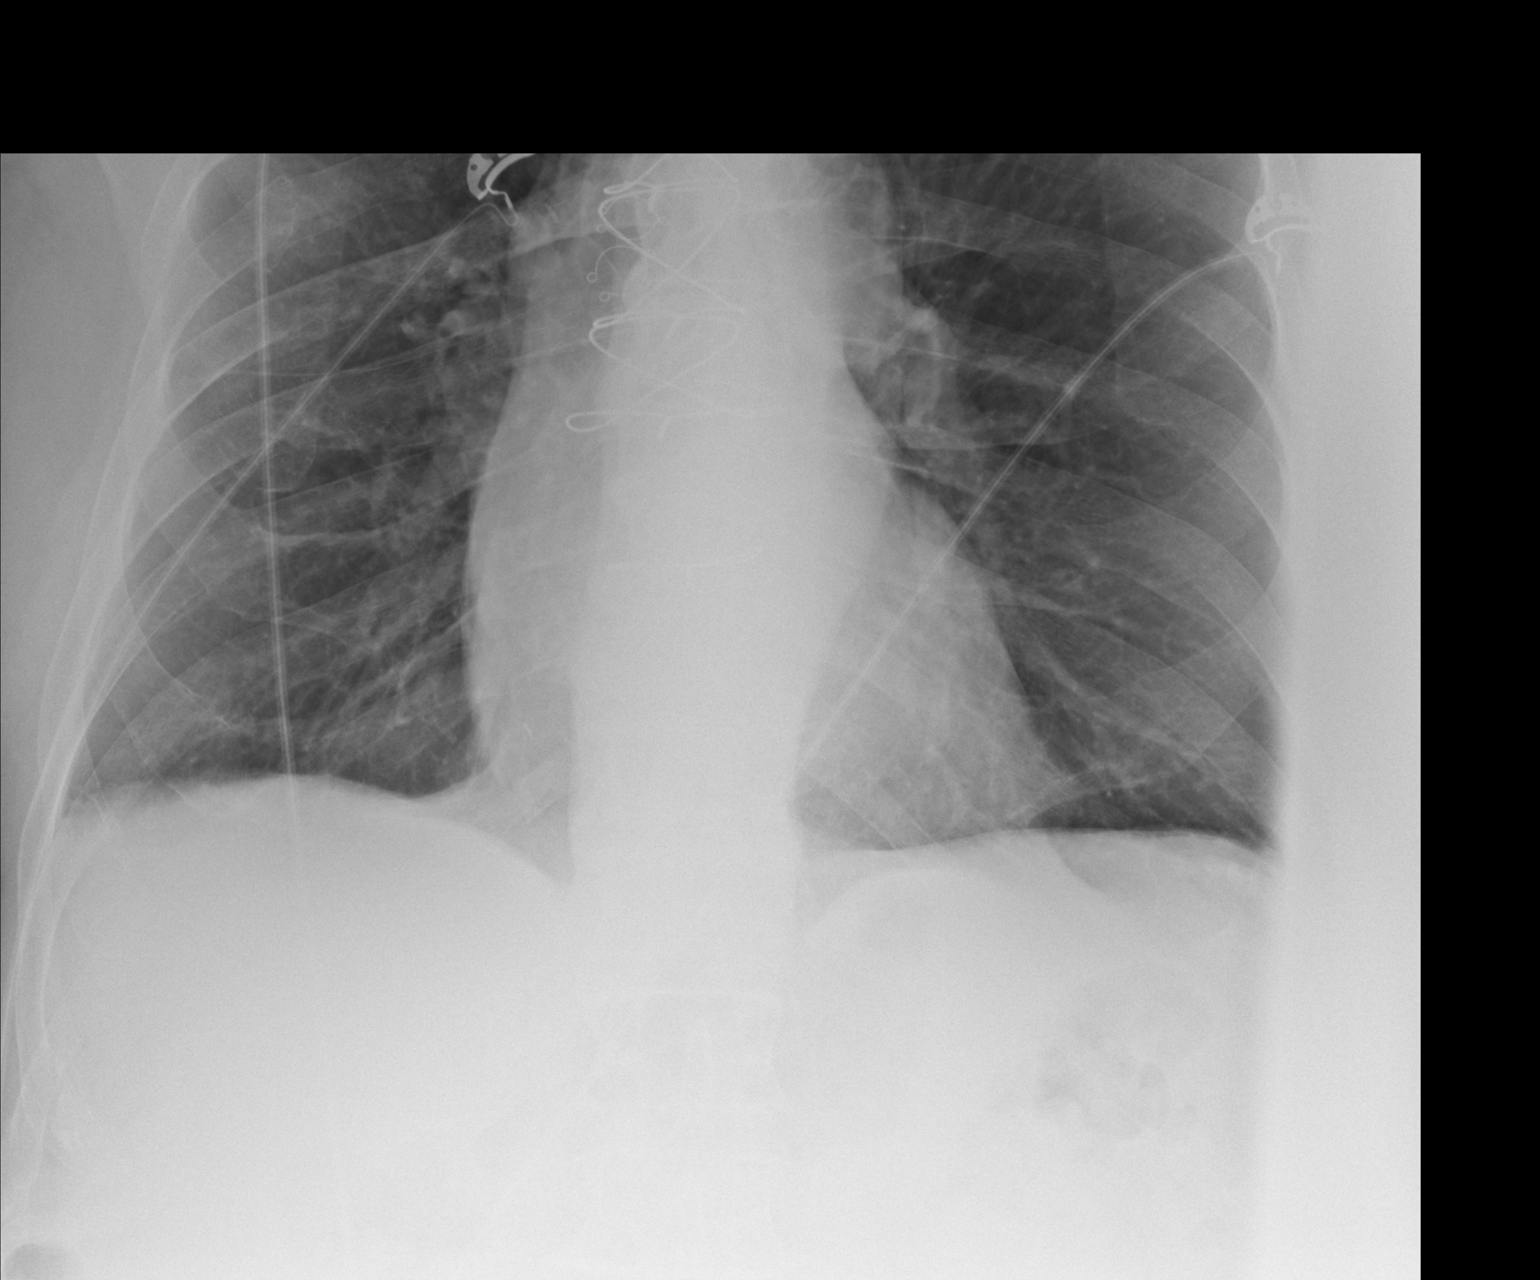

[3 of 3 positions shown; findings below may reference images not displayed]

FINDINGS: Lungs are clear.  No pleural effusion or pneumothorax.

The heart is normal in size.

Postsurgical changes related to prior CABG.  Median sternotomy.
IMPRESSION: No evidence of acute cardiopulmonary disease.

## 2016-09-27 MED ORDER — VITAMIN C 500 MG PO TABS
500.0000 mg | ORAL_TABLET | Freq: Every day | ORAL | Status: DC
  Administered 2016-09-28 – 2016-09-30 (×3): 500 mg via ORAL
  Filled 2016-09-27 (×3): qty 1

## 2016-09-27 MED ORDER — SPIRONOLACTONE 25 MG PO TABS
25.0000 mg | ORAL_TABLET | Freq: Every day | ORAL | Status: DC
Start: 2016-09-28 — End: 2016-09-28

## 2016-09-27 MED ORDER — MAGNESIUM OXIDE 400 (241.3 MG) MG PO TABS
200.0000 mg | ORAL_TABLET | Freq: Four times a day (QID) | ORAL | Status: DC
  Administered 2016-09-27 – 2016-09-30 (×10): 200 mg via ORAL
  Filled 2016-09-27 (×10): qty 1

## 2016-09-27 MED ORDER — SODIUM CHLORIDE 0.9 % IV SOLN: 250.0000 mL | INTRAVENOUS | Status: DC | PRN

## 2016-09-27 MED ORDER — WARFARIN - PHARMACIST DOSING INPATIENT: Freq: Every day | Status: DC

## 2016-09-27 MED ORDER — DOFETILIDE 250 MCG PO CAPS
250.0000 ug | ORAL_CAPSULE | Freq: Two times a day (BID) | ORAL | Status: DC
  Administered 2016-09-27 – 2016-09-30 (×6): 250 ug via ORAL
  Filled 2016-09-27 (×6): qty 1

## 2016-09-27 MED ORDER — SODIUM CHLORIDE 0.9% FLUSH: 3.0000 mL | INTRAVENOUS | Status: DC | PRN

## 2016-09-27 MED ORDER — ASPIRIN EC 81 MG PO TBEC
81.0000 mg | DELAYED_RELEASE_TABLET | Freq: Every day | ORAL | Status: DC
  Administered 2016-09-28 – 2016-09-30 (×3): 81 mg via ORAL
  Filled 2016-09-27 (×3): qty 1

## 2016-09-27 MED ORDER — ATORVASTATIN CALCIUM 40 MG PO TABS
40.0000 mg | ORAL_TABLET | Freq: Every day | ORAL | Status: DC
  Administered 2016-09-28 – 2016-09-30 (×3): 40 mg via ORAL
  Filled 2016-09-27 (×3): qty 1

## 2016-09-27 MED ORDER — CLONIDINE HCL 0.1 MG PO TABS
0.1000 mg | ORAL_TABLET | Freq: Two times a day (BID) | ORAL | Status: DC
  Administered 2016-09-27 – 2016-09-30 (×6): 0.1 mg via ORAL
  Filled 2016-09-27 (×6): qty 1

## 2016-09-27 MED ORDER — PANTOPRAZOLE SODIUM 40 MG PO TBEC
40.0000 mg | DELAYED_RELEASE_TABLET | Freq: Every day | ORAL | Status: DC
  Administered 2016-09-28 – 2016-09-30 (×3): 40 mg via ORAL
  Filled 2016-09-27 (×3): qty 1

## 2016-09-27 MED ORDER — WARFARIN SODIUM 10 MG PO TABS
10.0000 mg | ORAL_TABLET | Freq: Every day | ORAL | Status: DC
  Administered 2016-09-27 – 2016-09-29 (×3): 10 mg via ORAL
  Filled 2016-09-27 (×3): qty 1

## 2016-09-27 MED ORDER — SODIUM CHLORIDE 0.9% FLUSH
3.0000 mL | Freq: Two times a day (BID) | INTRAVENOUS | Status: DC
  Administered 2016-09-27 – 2016-09-30 (×6): 3 mL via INTRAVENOUS

## 2016-09-27 MED ORDER — VITAMIN D 1000 UNITS PO TABS
1000.0000 [IU] | ORAL_TABLET | Freq: Every day | ORAL | Status: DC
  Administered 2016-09-28 – 2016-09-30 (×3): 1000 [IU] via ORAL
  Filled 2016-09-27 (×4): qty 1

## 2016-09-27 MED ORDER — TERAZOSIN HCL 1 MG PO CAPS
1.0000 mg | ORAL_CAPSULE | Freq: Every day | ORAL | Status: DC
Start: 2016-09-28 — End: 2016-09-30
  Administered 2016-09-28 – 2016-09-29 (×2): 1 mg via ORAL
  Filled 2016-09-27 (×2): qty 1

## 2016-09-27 MED ORDER — NITROGLYCERIN 0.4 MG SL SUBL: 0.4000 mg | SUBLINGUAL_TABLET | SUBLINGUAL | Status: DC | PRN

## 2016-09-27 NOTE — Telephone Encounter (Signed)
Patient underwent cardioversion in May for afib, since yesterday, per wife his HR has been irregular, he also has significant cardiac awareness, include headache, fatigue. Per wife, she had to help him to get up this morning because he could not get up by himself. His HR is 90s, SBP 110s, blood glucose normal.   Given his extreme symptom, I think he needed to be evaluated by ED. May need to consider antiarrythmic medication given how much symptom he has while in aifb.  Hilbert Corrigan PA Pager: [phone]

## 2016-09-27 NOTE — ED Provider Notes (Signed)
Care assumed from [facility], PA-C, at shift change. Pt here with Afib, on anticoagulation. Results so far show BMP with gluc 282, Cr 1.69 at baseline; CBC WNL aside from plt 125; INR 2.58, CXR neg. Awaiting cardiology consult. Plan is to await cardiology recommendations, as they previously asked that Merry Proud not give any meds or cardiovert until they saw him.    Physical Exam  BP 145/76 (BP Location: Right Arm)   Pulse (!) 128   Temp 97.6 F (36.4 C) (Oral)   Resp 19   SpO2 99%   Physical Exam  ED Course  Procedures Results for orders placed or performed during the hospital encounter of 54/98/26  Basic metabolic panel  Result Value Ref Range   Sodium 134 (L) 135 - 145 mmol/L   Potassium 4.5 3.5 - 5.1 mmol/L   Chloride 104 101 - 111 mmol/L   CO2 23 22 - 32 mmol/L   Glucose, Bld 282 (H) 65 - 99 mg/dL   BUN 20 6 - 20 mg/dL   Creatinine, Ser 1.69 (H) 0.61 - 1.24 mg/dL   Calcium 8.6 (L) 8.9 - 10.3 mg/dL   GFR calc non Af Amer 37 (L) >60 mL/min   GFR calc Af Amer 43 (L) >60 mL/min   Anion gap 7 5 - 15  CBC  Result Value Ref Range   WBC 5.3 4.0 - 10.5 K/uL   RBC 4.34 4.22 - 5.81 MIL/uL   Hemoglobin 13.1 13.0 - 17.0 g/dL   HCT 39.8 39.0 - 52.0 %   MCV 91.7 78.0 - 100.0 fL   MCH 30.2 26.0 - 34.0 pg   MCHC 32.9 30.0 - 36.0 g/dL   RDW 13.5 11.5 - 15.5 %   Platelets 125 (L) 150 - 400 K/uL  Protime-INR  Result Value Ref Range   Prothrombin Time 28.2 (H) 11.4 - 15.2 seconds   INR 2.58    Dg Chest Port 1 View  Result Date: 09/27/2016 CLINICAL DATA:  Atrial fibrillation, dizziness, fatigue EXAM: PORTABLE CHEST 1 VIEW COMPARISON:  04/19/2016 FINDINGS: Lungs are clear.  No pleural effusion or pneumothorax. The heart is normal in size. Postsurgical changes related to prior CABG.  Median sternotomy. IMPRESSION: No evidence of acute cardiopulmonary disease. Electronically Signed   By: Julian Hy M.D.   On: 09/27/2016 11:57     No orders of the defined types were placed in this  encounter.    MDM:   ICD-9-CM ICD-10-CM   1. Atrial fibrillation, unspecified type ([place]) 427.31 I48.91   2. A-fib [facility]) 427.31 I48.91 DG Chest Port 1 View     DG Chest Port 1 View   4:31 PM- Cardiology saw pt, placed admission orders. Please see their H&P for further documentation of care/dispo.      Evony Rezek Camprubi-Soms, PA-C 09/27/16 1631    Leo Grosser, MD 09/28/16 1356

## 2016-09-27 NOTE — ED Notes (Signed)
Report attempted 

## 2016-09-27 NOTE — ED Triage Notes (Signed)
Patient here with extreme fatigue and recurrence of atrial fib that started yesterday, hx of same with cardioversions. Denies CP.

## 2016-09-27 NOTE — Progress Notes (Signed)
EKG three hours after first dose of Tikosyn shows a-flutter with rate of 102. QTc 385.

## 2016-09-27 NOTE — ED Notes (Signed)
Report called and accepted

## 2016-09-27 NOTE — Progress Notes (Addendum)
Pharmacy Review for Dofetilide (Tikosyn) Initiation  Admit Complaint: 75 y.o. male admitted 09/27/2016 with atrial fibrillation to be initiated on dofetilide.   Assessment:  Patient Exclusion Criteria: If any screening criteria checked as "Yes", then  patient  should NOT receive dofetilide until criteria item is corrected. If "Yes" please indicate correction plan.  YES  NO Patient  Exclusion Criteria Correction Plan  []  [x]  Baseline QTc interval is greater than or equal to 440 msec. IF above YES box checked dofetilide contraindicated unless patient has ICD; then may proceed if QTc 500-550 msec or with known ventricular conduction abnormalities may proceed with QTc 550-600 msec. QTc =  389   []  [x]  Magnesium level is less than 1.8 mEq/l : Last magnesium:  Lab Results  Component Value Date   MG 1.9 09/27/2016       Mag Oxide 200mg  PO QID ordered  []  [x]  Potassium level is less than 4 mEq/l : Last potassium:  Lab Results  Component Value Date   K 4.5 09/27/2016         []  [x]  Patient is known or suspected to have a digoxin level greater than 2 ng/ml: No results found for: DIGOXIN    []  [x]  Creatinine clearance less than 20 ml/min (calculated using Cockcroft-Gault, actual body weight and serum creatinine): Estimated Creatinine Clearance: 47.2 mL/min (by C-G formula based on SCr of 1.69 mg/dL (H)).    []  [x]  Patient has received drugs known to prolong the QT intervals within the last 48 hours (phenothiazines, tricyclics or tetracyclic antidepressants, erythromycin, H-1 antihistamines, cisapride, fluoroquinolones, azithromycin). Drugs not listed above may have an, as yet, undetected potential to prolong the QT interval, updated information on QT prolonging agents is available at this website:QT prolonging agents   []  [x]  Patient received a dose of hydrochlorothiazide (Oretic) alone or in any combination including triamterene (Dyazide, Maxzide) in the last 48 hours.   []  [x]  Patient  received a medication known to increase dofetilide plasma concentrations prior to initial dofetilide dose:  . Trimethoprim (Primsol, Proloprim) in the last 36 hours . Verapamil (Calan, Verelan) in the last 36 hours or a sustained release dose in the last 72 hours . Megestrol (Megace) in the last 5 days  . Cimetidine (Tagamet) in the last 6 hours . Ketoconazole (Nizoral) in the last 24 hours . Itraconazole (Sporanox) in the last 48 hours  . Prochlorperazine (Compazine) in the last 36 hours    []  [x]  Patient is known to have a history of torsades de pointes; congenital or acquired long QT syndromes.   []  [x]  Patient has received a Class 1 antiarrhythmic with less than 2 half-lives since last dose. (Disopyramide, Quinidine, Procainamide, Lidocaine, Mexiletine, Flecainide, Propafenone)   []  [x]  Patient has received amiodarone therapy in the past 3 months or amiodarone level is greater than 0.3 ng/ml.    Patient has been appropriately anticoagulated with warfarin.  Ordering provider was confirmed at [email or web address] if they are not listed on the [place] Prescribers list.  Goal of Therapy: Follow renal function, electrolytes, potential drug interactions, and dose adjustment. Provide education and 1 week supply at discharge.  Plan:  [x]   Physician selected initial dose within range recommended for patients level of renal function - will monitor for response.  []   Physician selected initial dose outside of range recommended for patients level of renal function - will discuss if the dose should be altered at this time.   Select One Calculated CrCl  Dose q12h  []  >  60 ml/min 500 mcg  [x]  40-60 ml/min 250 mcg  []  20-40 ml/min 125 mcg   2. Follow up QTc after the first 5 doses, renal function, electrolytes (K & Mg) daily x 3     days, dose adjustment, success of initiation and facilitate 1 week discharge supply as     clinically indicated.  3. Initiate Tikosyn education video (Call  [phone] and ask for Tikosyn Video # [phone]).  4. Place Enrollment Form on the chart for discharge supply of dofetilide.    Elicia Lamp, PharmD, BCPS Clinical Pharmacist 09/27/2016 6:00 PM

## 2016-09-27 NOTE — H&P (Signed)
Patient ID: Thomas Marshall MRN: [id number], DOB/AGE: July 19, 1941   Admit date: 09/27/2016   Primary Physician: Cathlean Cower, MD Primary Cardiologist: Dr. Burt Knack  Pt. Profile:  Thomas Marshall is a 75 year old Caucasian male with past medical history of hypertension, hyperlipidemia, type 2 diabetes, paroxysmal atrial fibrillation on Coumadin, and history of coronary artery disease s/p CABG in 2004 presented with recurrent atrial fibrillation with RVR after recent cardioversion back in May 2017.  Problem List  Past Medical History:  Diagnosis Date  . AAA (abdominal aortic aneurysm) ([place])   . Arthritis   . Cancer (HCC)    skin & squamous cell  . Carotid artery disease ([place])    a. s/p R CEA 12/2015.  [name] CKD (chronic kidney disease), stage III    a. per historical labs.  . Coronary artery disease    a. s/p CABG 2004.  . Diabetes mellitus without complication ([place])   . GERD (gastroesophageal reflux disease)   . Hyperlipidemia   . Hypertension   . Medication intolerance Multiple   . PAF (paroxysmal atrial fibrillation) ([place])   . Pneumonia   . Sinus bradycardia    a. baseline HR 50s-60s, also h/o bradycardia on metoprolol and carvedilol    Past Surgical History:  Procedure Laterality Date  . ANKLE SURGERY     right fused  . BACK SURGERY     low back   . CHOLECYSTECTOMY N/A 12/07/2013   Procedure: LAPAROSCOPIC CHOLECYSTECTOMY ;  Surgeon: [name], MD;  Location: [place];  Service: General;  Laterality: N/A;  . COLONOSCOPY    . CORONARY ARTERY BYPASS GRAFT  2004   [place]  . ENDARTERECTOMY Right 12/10/2015   Procedure: Right Carotid ENDARTERECTOMY;  Surgeon: Conrad Hickman, MD;  Location: [place];  Service: Vascular;  Laterality: Right;  . EXPLORATION POST OPERATIVE OPEN HEART    . FINGER SURGERY     skin graft on rt index  . FINGER SURGERY Right    right index finger.  [name] HERNIA REPAIR Right    [facility]  . JOINT REPLACEMENT    . KNEE SURGERY     replacement on both knees  .  PROSTATE SURGERY    . TONSILLECTOMY    . URINARY SURGERY     scar tissue     Allergies  Allergies  Allergen Reactions  . Lisinopril Swelling and Other (See Comments)    Angioedema.   . Losartan Swelling and Other (See Comments)    Angioedema   . Nifedipine Swelling    Sugar increase  . Triamterene Swelling  . Hydralazine Hcl Other (See Comments)    Fatigue; poor appetite  . Amoxicillin Rash  . Ampicillin Rash  . Carvedilol Other (See Comments)    Low heart rate     HPI  Thomas Marshall is a 74 year old Caucasian male with past medical history of hypertension, hyperlipidemia, type 2 diabetes, paroxysmal atrial fibrillation on Coumadin, and history of coronary artery disease s/p CABG in 2004. He also has stage III chronic kidney disease. His last cardioversion was in May 2017. Despite having no awareness of palpitation, he usually present with symptoms significant fatigue if he is in atrial fibrillation. His last office visit with Dr. Burt Knack on 07/03/2016, at that time, he was still having some generalized fatigue however was maintaining sinus rhythm. Dr. Burt Knack recommended potentially consider Tikosyn if he has any recurrence. He has multiple antihypertensive drug intolerance.  According to the patient, he woke up yesterday morning having a  headache and some dizziness. He also had worsening fatigue as well. He has been compliant with his medication. His wife contacted after hour answering service on 09/27/2016 and was instructed to seek medical attention in the emergency room as despite a normal vital sign, he was having a lot of symptom while in atrial fibrillation. While in the emergency room, he was noted to be atrial flutter ablation with RVR with heart rate of 130s.   His INR has been therapeutic in the last month. Significant laboratory finding on this visit include creatinine of 1.69. Sodium 134. Normal white blood cell count, hemoglobin, platelet 125. Otherwise he denies any chest  discomfort. He denies any significant shortness of breath. His chest x-ray shows no acute process. INR is 2.58. Cardiology has been consulted for atrial fibrillation with RVR.   Home Medications  Prior to Admission medications   Medication Sig Start Date End Date Taking? Authorizing Provider  aspirin 81 MG tablet Take 81 mg by mouth daily.    Historical Provider, MD  atorvastatin (LIPITOR) 40 MG tablet Take 40 mg by mouth daily.    Historical Provider, MD  cholecalciferol (VITAMIN D) 1000 UNITS tablet Take 1,000 Units by mouth daily.    Historical Provider, MD  cloNIDine (CATAPRES) 0.1 MG tablet TAKE ONE TABLET BY MOUTH TWICE DAILY 01/01/16   [name], MD  glucose blood test strip Use to test blood glucose level 2 times a day. 01/24/16   Virginia Crews, MD  magnesium oxide (MAG-OX) 400 MG tablet Take 1 tablet by mouth 4 times a day 05/08/16   [name], MD  metFORMIN (GLUCOPHAGE) 1000 MG tablet Take 1 tablet (1,000 mg total) by mouth 2 (two) times daily with a meal. 09/16/16   [name], MD  nitroGLYCERIN (NITROSTAT) 0.4 MG SL tablet Place 1 tablet (0.4 mg total) under the tongue every 5 (five) minutes as needed for chest pain (x 3 doses). 05/22/16   Virginia Crews, MD  omeprazole (PRILOSEC) 20 MG capsule Take 20 mg by mouth daily.    Historical Provider, MD  sildenafil (VIAGRA) 100 MG tablet Take 0.5-1 tablets (50-100 mg total) by mouth daily as needed for erectile dysfunction. 09/16/16   [name], MD  spironolactone (ALDACTONE) 25 MG tablet Take 1 tablet (25 mg total) by mouth daily. 04/24/16   Dayna N Dunn, PA-C  terazosin (HYTRIN) 1 MG capsule Take 1 capsule (1 mg total) by mouth at bedtime. 04/02/16   Liliane Shi, PA-C  traMADol (ULTRAM) 50 MG tablet Take 1 tablet (50 mg total) by mouth every 6 (six) hours as needed. 08/06/16   Veryl Speak, MD  vitamin C (ASCORBIC ACID) 500 MG tablet Take 500 mg by mouth daily.    Historical Provider, MD  warfarin (COUMADIN) 10 MG  tablet TAKE ONE TABLET BY MOUTH ONCE DAILY AS DIRECTED 03/11/16   [name], MD    Family History  Family History  Problem Relation Age of Onset  . Stroke Mother   . Heart disease Mother   . Heart disease Father   . Hypertension Father   . Hyperlipidemia Father   . Cancer Sister     breast  and skin  . Diabetes Sister   . Hypertension Sister   . Diabetes Brother   . Heart disease Brother   . Hyperlipidemia Brother   . Hypertension Brother   . Early death Neg Hx     Social History  Social History   Social History  .  Marital status: Married    Spouse name: N/A  . Number of children: N/A  . Years of education: N/A   Occupational History  . Not on file.   Social History Main Topics  . Smoking status: Former Smoker    Packs/day: 1.00    Years: 13.00    Quit date: 11/30/1970  . Smokeless tobacco: Never Used  . Alcohol use No  . Drug use: No  . Sexual activity: Yes   Other Topics Concern  . Not on file   Social History Narrative  . No narrative on file     Review of Systems General:  No chills, fever, night sweats or weight changes.  Cardiovascular:  No chest pain, edema, orthopnea, palpitations, paroxysmal nocturnal dyspnea. +dyspnea on exertion Dermatological: No rash, lesions/masses Respiratory: No cough, dyspnea Urologic: No hematuria, dysuria Abdominal:   No nausea, vomiting, diarrhea, bright red blood per rectum, melena, or hematemesis Neurologic:  No visual changes, changes in mental status. +wkns All other systems reviewed and are otherwise negative except as noted above.  Physical Exam  Blood pressure 108/82, pulse 61, temperature 97.6 F (36.4 C), temperature source Oral, resp. rate 20, SpO2 98 %.  General: Pleasant, NAD Psych: Normal affect. Neuro: Alert and oriented X 3. Moves all extremities spontaneously. HEENT: Normal  Neck: Supple without bruits or JVD. Lungs:  Resp regular and unlabored, CTA. Heart:Tachycardic, irregular. no s3, s4,  or murmurs. Abdomen: Soft, non-tender, non-distended, BS + x 4.  Extremities: No clubbing, cyanosis or edema. DP/PT/Radials 2+ and equal bilaterally.  Labs  Troponin (Point of Care Test) No results for input(s): TROPIPOC in the last 72 hours. No results for input(s): CKTOTAL, CKMB, TROPONINI in the last 72 hours. Lab Results  Component Value Date   WBC 5.3 09/27/2016   HGB 13.1 09/27/2016   HCT 39.8 09/27/2016   MCV 91.7 09/27/2016   PLT 125 (L) 09/27/2016    Recent Labs Lab 09/27/16 1108  NA 134*  K 4.5  CL 104  CO2 23  BUN 20  CREATININE 1.69*  CALCIUM 8.6*  GLUCOSE 282*   Lab Results  Component Value Date   CHOL 127 09/16/2016   HDL 34.00 (L) 09/16/2016   LDLCALC 63 05/13/2015   TRIG 209.0 (H) 09/16/2016   No results found for: DDIMER   Radiology/Studies  Dg Chest Port 1 View  Result Date: 09/27/2016 CLINICAL DATA:  Atrial fibrillation, dizziness, fatigue EXAM: PORTABLE CHEST 1 VIEW COMPARISON:  04/19/2016 FINDINGS: Lungs are clear.  No pleural effusion or pneumothorax. The heart is normal in size. Postsurgical changes related to prior CABG.  Median sternotomy. IMPRESSION: No evidence of acute cardiopulmonary disease. Electronically Signed   By: Julian Hy M.D.   On: 09/27/2016 11:57    ECG  Atrial fibrillation with RVR.  Echocardiogram 05/27/2016  LV EF: 55% -   60%  ------------------------------------------------------------------- Indications:      (I48.0).  ------------------------------------------------------------------- History:   PMH:  Acquired from the patient and from the patient&'s chart.  Paroxysmal atrial fibrillation.  Coronary artery disease. Risk factors:  Former tobacco use. Hypertension. Diabetes mellitus. Dyslipidemia.  ------------------------------------------------------------------- Study Conclusions  - Left ventricle: The cavity size was normal. Wall thickness was   increased in a pattern of mild LVH. Systolic  function was normal.   The estimated ejection fraction was in the range of 55% to 60%.   Wall motion was normal; there were no regional wall motion   abnormalities. Features are consistent with a pseudonormal left  ventricular filling pattern, with concomitant abnormal relaxation   and increased filling pressure (grade 2 diastolic dysfunction). - Aortic valve: Trileaflet; moderately calcified leaflets.   Sclerosis without stenosis. - Aorta: Mildly dilated aortic root. - Mitral valve: Moderately to severely calcified annulus. There was   trivial regurgitation. - Left atrium: The atrium was moderately dilated. - Right ventricle: The cavity size was normal. Systolic function   was normal. - Right atrium: The atrium was mildly dilated. - Tricuspid valve: Peak RV-RA gradient (S): 26 mm Hg. - Pulmonary arteries: PA peak pressure: 29 mm Hg (S). - Inferior vena cava: The vessel was normal in size. The   respirophasic diameter changes were in the normal range (>= 50%),   consistent with normal central venous pressure.  Impressions:  - Normal LV size with mild LV hypertrophy. EF 55-60%. Moderate   diastolic dysfunction. Normal RV size and systolic function.   Aortic sclerosis without significant stenosis. Biatrial   enlargement.    ASSESSMENT AND PLAN  1. Recurrent atrial flutter ablation with RVR  - Compliant with Coumadin at home, INR therapeutic at 2.58  - His calculated creatinine clearance is 54, plan to start at 250 g twice a day of Tikosyn. We'll monitor QT interval closely.  - Plan for DC cardioversion if he does not convert after 5 doses.  - This patients CHA2DS2-VASc Score and unadjusted Ischemic Stroke Rate (% per year) is equal to 7.2 % stroke rate/year from a score of 5  Above score calculated as 1 point each if present [CHF, HTN, DM, Vascular=MI/PAD/Aortic Plaque, Age if 65-74, or Male] Above score calculated as 2 points each if present [Age > 75, or  Stroke/TIA/TE]   2. Coronary artery disease s/p CABG in 2004: No obvious angina 3. Hypertension: Blood pressure currently stable. 4. Hyperlipidemia: On Lipitor 5. type 2 diabetes   Signed, Almyra Deforest, PA-C 09/27/2016, 3:28 PM   Patient seen and independently examined with Almyra Deforest, PA. We discussed all aspects of the encounter. I agree with the assessment and plan as stated above with minor changes.  Patient presented with complaints of HA and dizziness as well as fatigue and found to be in afib with RVR.  His INR has been therapeutic for > 2 months.  Per recommendations from prior note from Dr. Burt Knack will admit to tele bed and start Tikosyn dose adjusted for CrCl 54cc.min.  Will start 248mcg BID and follow QTc which is stable on current EKG.  HR currently controlled.  If he does not convert after 5 doses will plan DCCV.    Signed: Fransico Him, MD [facility] 09/27/2016

## 2016-09-27 NOTE — ED Provider Notes (Signed)
[facility] DEPT Provider Note   CSN: [id number] Arrival date & time: 09/27/16  1020     History   Chief Complaint No chief complaint on file.   HPI Thomas Marshall is a 75 y.o. male.  HPI   Cardiologist is Burt Knack  75 year old male with a history of atrial fibrillation, presents today with return of A. fib symptoms. Patient was recently underwent cardioversion in May for A. fib, yesterday patient notes irregular heart rate, headache and fatigue.  He denies any associated nausea or vomiting. No lower extremity swelling or edema. Patient is taking warfarin daily.  Patient was most recently seen by Dr. Burt Knack in August 2017.  Patient was originally on chronic warfarin and underwent DC CVA in May 2017 after symptomatic presentation.  Past Medical History:  Diagnosis Date  . AAA (abdominal aortic aneurysm) ([place])   . Arthritis   . Cancer (HCC)    skin & squamous cell  . Carotid artery disease ([place])    a. s/p R CEA 12/2015.  [name] CKD (chronic kidney disease), stage III    a. per historical labs.  . Coronary artery disease    a. s/p CABG 2004.  . Diabetes mellitus without complication ([place])   . GERD (gastroesophageal reflux disease)   . Hyperlipidemia   . Hypertension   . Medication intolerance Multiple   . PAF (paroxysmal atrial fibrillation) ([place])   . Pneumonia   . Sinus bradycardia    a. baseline HR 50s-60s, also h/o bradycardia on metoprolol and carvedilol    Patient Active Problem List   Diagnosis Date Noted  . Erectile dysfunction 09/16/2016  . Hypomagnesemia 05/22/2016  . Essential hypertension 04/21/2016  . CAD (coronary artery disease) 03/02/2016  . Neck abscess 01/10/2016  . Carotid artery disease without cerebral infarction ([place]) 11/15/2015  . Right facial swelling 08/06/2015  . BPH (benign prostatic hypertrophy) 08/06/2015  . Abscess of left thigh 07/16/2015  . Cellulitis and abscess of leg 07/09/2015  . CKD (chronic kidney disease) stage 3, GFR 30-59  ml/min 05/14/2015  . Carotid stenosis 05/01/2015  . PVD (peripheral vascular disease) ([place]) 04/09/2015  . AAA (abdominal aortic aneurysm) ([place]) 04/09/2015  . S/P CABG x 4 2004 09/10/2014  . PAF (paroxysmal atrial fibrillation) ([place]) 09/04/2013  . Long term current use of anticoagulant therapy 09/04/2013  . Hx of squamous cell carcinoma of skin   . Type 2 diabetes mellitus with renal manifestations ([place])   . GERD (gastroesophageal reflux disease)   . Hyperlipidemia     Past Surgical History:  Procedure Laterality Date  . ANKLE SURGERY     right fused  . BACK SURGERY     low back   . CHOLECYSTECTOMY N/A 12/07/2013   Procedure: LAPAROSCOPIC CHOLECYSTECTOMY ;  Surgeon: [name], MD;  Location: [facility];  Service: General;  Laterality: N/A;  . COLONOSCOPY    . CORONARY ARTERY BYPASS GRAFT  2004   [facility]  . ENDARTERECTOMY Right 12/10/2015   Procedure: Right Carotid ENDARTERECTOMY;  Surgeon: Conrad [place], MD;  Location: [facility];  Service: Vascular;  Laterality: Right;  . EXPLORATION POST OPERATIVE OPEN HEART    . FINGER SURGERY     skin graft on rt index  . FINGER SURGERY Right    right index finger.  [name] HERNIA REPAIR Right    RIH  . JOINT REPLACEMENT    . KNEE SURGERY     replacement on both knees  . PROSTATE SURGERY    . TONSILLECTOMY    .  URINARY SURGERY     scar tissue       Home Medications    Prior to Admission medications   Medication Sig Start Date End Date Taking? Authorizing Provider  aspirin 81 MG tablet Take 81 mg by mouth daily.    Historical Provider, MD  atorvastatin (LIPITOR) 40 MG tablet Take 40 mg by mouth daily.    Historical Provider, MD  cholecalciferol (VITAMIN D) 1000 UNITS tablet Take 1,000 Units by mouth daily.    Historical Provider, MD  cloNIDine (CATAPRES) 0.1 MG tablet TAKE ONE TABLET BY MOUTH TWICE DAILY 01/01/16   [name], MD  glucose blood test strip Use to test blood glucose level 2 times a day. 01/24/16   Virginia Crews, MD   magnesium oxide (MAG-OX) 400 MG tablet Take 1 tablet by mouth 4 times a day 05/08/16   [name], MD  metFORMIN (GLUCOPHAGE) 1000 MG tablet Take 1 tablet (1,000 mg total) by mouth 2 (two) times daily with a meal. 09/16/16   [name], MD  nitroGLYCERIN (NITROSTAT) 0.4 MG SL tablet Place 1 tablet (0.4 mg total) under the tongue every 5 (five) minutes as needed for chest pain (x 3 doses). 05/22/16   Virginia Crews, MD  omeprazole (PRILOSEC) 20 MG capsule Take 20 mg by mouth daily.    Historical Provider, MD  sildenafil (VIAGRA) 100 MG tablet Take 0.5-1 tablets (50-100 mg total) by mouth daily as needed for erectile dysfunction. 09/16/16   [name], MD  spironolactone (ALDACTONE) 25 MG tablet Take 1 tablet (25 mg total) by mouth daily. 04/24/16   Dayna N Dunn, PA-C  terazosin (HYTRIN) 1 MG capsule Take 1 capsule (1 mg total) by mouth at bedtime. 04/02/16   Liliane Shi, PA-C  traMADol (ULTRAM) 50 MG tablet Take 1 tablet (50 mg total) by mouth every 6 (six) hours as needed. 08/06/16   Veryl Speak, MD  vitamin C (ASCORBIC ACID) 500 MG tablet Take 500 mg by mouth daily.    Historical Provider, MD  warfarin (COUMADIN) 10 MG tablet TAKE ONE TABLET BY MOUTH ONCE DAILY AS DIRECTED 03/11/16   [name], MD    Family History Family History  Problem Relation Age of Onset  . Stroke Mother   . Heart disease Mother   . Heart disease Father   . Hypertension Father   . Hyperlipidemia Father   . Cancer Sister     breast  and skin  . Diabetes Sister   . Hypertension Sister   . Diabetes Brother   . Heart disease Brother   . Hyperlipidemia Brother   . Hypertension Brother   . Early death Neg Hx     Social History Social History  Substance Use Topics  . Smoking status: Former Smoker    Packs/day: 1.00    Years: 13.00    Quit date: 11/30/1970  . Smokeless tobacco: Never Used  . Alcohol use No     Allergies   Lisinopril; Losartan; Nifedipine; Triamterene; Hydralazine hcl;  Amoxicillin; Ampicillin; and Carvedilol   Review of Systems Review of Systems  All other systems reviewed and are negative.    Physical Exam Updated Vital Signs There were no vitals taken for this visit.  Physical Exam  Constitutional: He is oriented to person, place, and time. He appears well-developed and well-nourished.  HENT:  Head: Normocephalic and atraumatic.  Eyes: Conjunctivae are normal. Pupils are equal, round, and reactive to light. Right eye exhibits no discharge. Left  eye exhibits no discharge. No scleral icterus.  Neck: Normal range of motion. No JVD present. No tracheal deviation present.  Cardiovascular:   Irregular rhythm  Pulmonary/Chest: Effort normal. No stridor.  Neurological: He is alert and oriented to person, place, and time. Coordination normal.  Psychiatric: He has a normal mood and affect. His behavior is normal. Judgment and thought content normal.  Nursing note and vitals reviewed.    ED Treatments / Results  Labs (all labs ordered are listed, but only abnormal results are displayed) Labs Reviewed  BASIC METABOLIC PANEL  CBC    EKG  EKG Interpretation None       Radiology No results found.  Procedures Procedures (including critical care time)  Medications Ordered in ED Medications - No data to display   Initial Impression / Assessment and Plan / ED Course  I have reviewed the triage vital signs and the nursing notes.  Pertinent labs & imaging results that were available during my care of the patient were reviewed by me and considered in my medical decision making (see chart for details).  Clinical Course     Final Clinical Impressions(s) / ED Diagnoses   Final diagnoses:  Atrial fibrillation, unspecified type ([place])    Labs: BMP, CBC, PT/INR  Imaging: DG chest portable 1 view  Consults:  Therapeutics:  Discharge Meds:   Assessment/Plan:     75 year old male presents today with complaints of chest pain.  The patient with a significant past medical history of atrial fibrillation. Patient appears to be in A. fib now. He is therapeutic on his Coumadin. His vital signs are stable at this time. Cardiology consult and who reports that they would personally evaluate the patient in the ED .  3:58 PM Pending cardiology evaluation - patient care signed out to oncoming provider.     New Prescriptions New Prescriptions   No medications on file     Okey Regal, PA-C 09/27/16 [place], MD 10/01/16 1322

## 2016-09-27 NOTE — Progress Notes (Signed)
ANTICOAGULATION CONSULT NOTE - Initial Consult  Pharmacy Consult for warfarin Indication: atrial fibrillation  Allergies  Allergen Reactions  . Lisinopril Swelling and Other (See Comments)    Angioedema.   . Losartan Swelling and Other (See Comments)    Angioedema   . Nifedipine Swelling    Sugar increase  . Triamterene Swelling  . Hydralazine Hcl Other (See Comments)    Fatigue; poor appetite  . Amoxicillin Rash  . Ampicillin Rash  . Carvedilol Other (See Comments)    Low heart rate     Patient Measurements: Height: 6\' 2"  (188 cm) IBW/kg (Calculated) : 82.2  Vital Signs: Temp: 98.1 F (36.7 C) (10/29 1749) Temp Source: Oral (10/29 1749) BP: 134/88 (10/29 1749) Pulse Rate: 112 (10/29 1749)  Labs:  Recent Labs  09/27/16 1108  HGB 13.1  HCT 39.8  PLT 125*  LABPROT 28.2*  INR 2.58  CREATININE 1.69*    Estimated Creatinine Clearance: 47.2 mL/min (by C-G formula based on SCr of 1.69 mg/dL (H)).   Medical History: Past Medical History:  Diagnosis Date  . AAA (abdominal aortic aneurysm) ([place])   . Arthritis   . Cancer (HCC)    skin & squamous cell  . Carotid artery disease ([place])    a. s/p R CEA 12/2015.  [name] CKD (chronic kidney disease), stage III    a. per historical labs.  . Coronary artery disease    a. s/p CABG 2004.  . Diabetes mellitus without complication ([place])   . GERD (gastroesophageal reflux disease)   . Hyperlipidemia   . Hypertension   . Medication intolerance Multiple   . PAF (paroxysmal atrial fibrillation) ([place])   . Pneumonia   . Sinus bradycardia    a. baseline HR 50s-60s, also h/o bradycardia on metoprolol and carvedilol    Assessment: 75 yom here for Tikosyn initiation. Pharmacy consulted to continue PTA warfarin inpatient. INR therapeutic on admit at 2.58 and has been stable PTA. Hg wnl, plt 125, no bleed documented.  PTA dose: 10mg  daily (last dose 10/28 PTA)  Goal of Therapy:  INR 2-3 Monitor platelets by anticoagulation  protocol: Yes   Plan:  Warfarin 10mg  daily (PTA dose resumed) Daily INR Monitor CBC, s/sx bleeding   Elicia Lamp, PharmD, [facility] Clinical Pharmacist Pager [phone] 09/27/2016 6:03 PM

## 2016-09-27 NOTE — ED Notes (Signed)
Tray ordered.

## 2016-09-28 DIAGNOSIS — I4891 Unspecified atrial fibrillation: Secondary | ICD-10-CM

## 2016-09-28 DIAGNOSIS — E875 Hyperkalemia: Secondary | ICD-10-CM

## 2016-09-28 DIAGNOSIS — N189 Chronic kidney disease, unspecified: Secondary | ICD-10-CM

## 2016-09-28 LAB — BASIC METABOLIC PANEL
Anion gap: 6 (ref 5–15)
BUN: 21 mg/dL — AB (ref 6–20)
CO2: 29 mmol/L (ref 22–32)
Calcium: 8.7 mg/dL — ABNORMAL LOW (ref 8.9–10.3)
Chloride: 102 mmol/L (ref 101–111)
Creatinine, Ser: 1.81 mg/dL — ABNORMAL HIGH (ref 0.61–1.24)
GFR calc Af Amer: 40 mL/min — ABNORMAL LOW (ref >60)
GFR, EST NON AFRICAN AMERICAN: 34 mL/min — AB (ref >60)
GLUCOSE: 137 mg/dL — AB (ref 65–99)
POTASSIUM: 5.2 mmol/L — AB (ref 3.5–5.1)
Sodium: 137 mmol/L (ref 135–145)

## 2016-09-28 LAB — GLUCOSE, CAPILLARY
GLUCOSE-CAPILLARY: 171 mg/dL — AB (ref 65–99)
GLUCOSE-CAPILLARY: 178 mg/dL — AB (ref 65–99)

## 2016-09-28 LAB — PROTIME-INR
INR: 2.67
PROTHROMBIN TIME: 29 s — AB (ref 11.4–15.2)

## 2016-09-28 LAB — MAGNESIUM: MAGNESIUM: 1.9 mg/dL (ref 1.7–2.4)

## 2016-09-28 MED ORDER — INSULIN ASPART 100 UNIT/ML ~~LOC~~ SOLN
0.0000 [IU] | Freq: Three times a day (TID) | SUBCUTANEOUS | Status: DC
  Administered 2016-09-28 – 2016-09-29 (×2): 3 [IU] via SUBCUTANEOUS
  Administered 2016-09-29 – 2016-09-30 (×2): 5 [IU] via SUBCUTANEOUS
  Administered 2016-09-30: 2 [IU] via SUBCUTANEOUS

## 2016-09-28 NOTE — Progress Notes (Signed)
Patient Potasium level this AM 5.2, Reino Bellis, [facility] on floor to see patient and made aware will monitor patient Dezi Brauner, Bettina Gavia RN

## 2016-09-28 NOTE — Progress Notes (Signed)
Insurance check completed for Tikosyn Pt does not have any Part D coverage- No prescription Drug plan- uses the [place] for medication needs- will need approval from the [place] for Tikosyn.

## 2016-09-28 NOTE — Care Management Note (Signed)
Case Management Note Marvetta Gibbons RN, BSN Unit 2W-Case Manager [phone]  Patient Details  Name: Thomas Marshall MRN: [id number] Date of Birth: 06/12/1941  Subjective/Objective:  Pt admitted with afib- for Tikosyn load                  Action/Plan: PTA pt lived at home with wife- anticipate return home- referral received for Tikosyn needs- per conversation with pt at bedside- he does not have any prescription drug coverage - NO PART D- pt states that he gets his meds via mail order through the [place] [facility]) or pays out of pocket for inexpensive medications. Pt's PCP at the [facility] is  Dr. Rondell Reams- have called the [facility] clinic and left msg- awaiting return call- pt will need prior approval from the [place] for Tikosyn. - States that if [place] does not cover drug can not afford out of pocket cost- Pt could be discharged with a 14 day supply of Tikosyn if needed for the [facility] to to through the approval process- will await return call from [place] to see what needs to be done.   Expected Discharge Date:              Expected Discharge Plan:  Home/Self Care  In-House Referral:     Discharge planning Services  Medication Assistance, CM Consult  Post Acute Care Choice:    Choice offered to:     DME Arranged:    DME Agency:     HH Arranged:    HH Agency:     Status of Service:  Completed, signed off  If discussed at [employer] of Stay Meetings, dates discussed:    Additional Comments:  Dawayne Patricia, RN 09/28/2016, 3:48 PM

## 2016-09-28 NOTE — Progress Notes (Signed)
Exit care document given on tikosy to patient and spouse. Jaelee Laughter, Bettina Gavia RN

## 2016-09-28 NOTE — Progress Notes (Signed)
Patient Name: Thomas Marshall Date of Encounter: 09/28/2016  Primary Cardiologist: Dr. Tyrell Antonio Problem List     Active Problems:   Hyperlipidemia   CKD (chronic kidney disease) stage 3, GFR 30-59 ml/min   CAD (coronary artery disease)   Essential hypertension   Paroxysmal atrial fibrillation with RVR (HCC)     Subjective   Feeling well.   Inpatient Medications    Scheduled Meds: . aspirin EC  81 mg Oral Daily  . atorvastatin  40 mg Oral Daily  . cholecalciferol  1,000 Units Oral Daily  . cloNIDine  0.1 mg Oral BID  . dofetilide  250 mcg Oral BID  . magnesium oxide  200 mg Oral QID  . pantoprazole  40 mg Oral Daily  . sodium chloride flush  3 mL Intravenous Q12H  . spironolactone  25 mg Oral Daily  . terazosin  1 mg Oral QHS  . vitamin C  500 mg Oral Daily  . warfarin  10 mg Oral q1800  . Warfarin - Pharmacist Dosing Inpatient   Does not apply q1800   Continuous Infusions:   PRN Meds: sodium chloride, nitroGLYCERIN, sodium chloride flush   Vital Signs    Vitals:   09/27/16 1756 09/27/16 2005 09/28/16 0530 09/28/16 0942  BP:  121/78 123/71 115/73  Pulse:  66 (!) 106 99  Resp:  18 18   Temp:  98.3 F (36.8 C) 98.1 F (36.7 C)   TempSrc:  Oral Oral   SpO2:  98% 98% 99%  Height: 6\' 2"  (1.88 m)      No intake or output data in the 24 hours ending 09/28/16 1040 There were no vitals filed for this visit.  Physical Exam   GEN: Well nourished, well developed, in no acute distress.  HEENT: Grossly normal.  Neck: Supple, no JVD, carotid bruits, or masses. Cardiac: Irreg Irreg, no murmurs, rubs, or gallops. No clubbing, cyanosis, edema.  Radials/DP/PT 2+ and equal bilaterally.  Respiratory:  Respirations regular and unlabored, clear to auscultation bilaterally. GI: Soft, nontender, nondistended, BS + x 4. MS: no deformity or atrophy. Skin: warm and dry, no rash. Neuro:  Strength and sensation are intact. Psych: AAOx3.  Normal affect.  Labs     CBC  Recent Labs  09/27/16 1108  WBC 5.3  HGB 13.1  HCT 39.8  MCV 91.7  PLT 440*   Basic Metabolic Panel  Recent Labs  09/27/16 1758 09/28/16 0232  NA 139 137  K 4.8 5.2*  CL 103 102  CO2 29 29  GLUCOSE 110* 137*  BUN 21* 21*  CREATININE 1.60* 1.81*  CALCIUM 9.0 8.7*  MG 1.9 1.9   Liver Function Tests No results for input(s): AST, ALT, ALKPHOS, BILITOT, PROT, ALBUMIN in the last 72 hours. No results for input(s): LIPASE, AMYLASE in the last 72 hours. Cardiac Enzymes No results for input(s): CKTOTAL, CKMB, CKMBINDEX, TROPONINI in the last 72 hours. BNP Invalid input(s): POCBNP D-Dimer No results for input(s): DDIMER in the last 72 hours. Hemoglobin A1C No results for input(s): HGBA1C in the last 72 hours. Fasting Lipid Panel No results for input(s): CHOL, HDL, LDLCALC, TRIG, CHOLHDL, LDLDIRECT in the last 72 hours. Thyroid Function Tests No results for input(s): TSH, T4TOTAL, T3FREE, THYROIDAB in the last 72 hours.  Invalid input(s): FREET3  Telemetry    AF Rates 120s- Personally Reviewed  ECG    AF - Personally Reviewed  Radiology    Dg Chest [facility] 1 View  Result  Date: 09/27/2016 CLINICAL DATA:  Atrial fibrillation, dizziness, fatigue EXAM: PORTABLE CHEST 1 VIEW COMPARISON:  04/19/2016 FINDINGS: Lungs are clear.  No pleural effusion or pneumothorax. The heart is normal in size. Postsurgical changes related to prior CABG.  Median sternotomy. IMPRESSION: No evidence of acute cardiopulmonary disease. Electronically Signed   By: Julian Hy M.D.   On: 09/27/2016 11:57    Cardiac Studies     Patient Profile     75 year old Caucasian male with past medical history of hypertension, hyperlipidemia, type 2 diabetes, paroxysmal atrial fibrillation on Coumadin, and history of coronary artery disease s/p CABG in 2004 presented with recurrent atrial fibrillation with RVR after recent cardioversion back in May 2017.  Assessment & Plan    1.  Atrial fib  with RVR             - Compliant with Coumadin at home, INR therapeutic at 2.58             - His calculated creatinine clearance is 54, and was started at 250 g twice a day of Tikosyn. We'll monitor QT interval closely. Has had 2 doses thus far.             - Plan for DC cardioversion if he does not convert after 5 doses.             - This patients CHA2DS2-VASc Score and unadjusted Ischemic Stroke Rate (% per year) is equal to 7.2 % stroke rate/year from a score of 5  Above score calculated as 1 point each if present [CHF, HTN, DM, Vascular=MI/PAD/Aortic Plaque, Age if 65-74, or Male] Above score calculated as 2 points each if present [Age > 75, or Stroke/TIA/TE]   2. Coronary artery disease s/p CABG in 2004: No obvious angina 3. Hypertension: Blood pressure currently stable. 4. Hyperlipidemia: On Lipitor 5. type 2 diabetes  Pt was recently started back on metformin 1 g bid as Hgb A1C was 8  Discussed with pharmacy  OK to resume as Cr Cl is OK   Will resume   6. Hyperkalemia: Potassium is 5.2 this morning, will hold spironolactone.  7. CKD: Slight increase in Cr 1.6>>1.8. Monitor BMET 8  Sleep apnea  Had test this summer  Will look for results    Signed, Reino Bellis, NP   Pt seen and examined  I agreee with findings as noted above by L Mancel Bale  Lungs are CTA  Cardiac irreg irreg  Ext without edema Still in afib with RVR  Continue Tikosyn load    Dorris Carnes  09/28/2016, 10:40 AM

## 2016-09-28 NOTE — Progress Notes (Signed)
Spoke with Reino Bellis NPC about AM  QTC after 2nd dose of Tikosyn she stated oK to give night does of Tikosyn report will be given to oncoming RN this evenign and orders written for CBGs will monitor patient. Levonne Carreras, Bettina Gavia RN

## 2016-09-28 NOTE — Progress Notes (Signed)
ANTICOAGULATION CONSULT NOTE - Follow Up Consult  Pharmacy Consult for Coumadin Indication: atrial fibrillation  Allergies  Allergen Reactions  . Lisinopril Swelling and Other (See Comments)    Angioedema.   . Losartan Swelling and Other (See Comments)    Angioedema   . Nifedipine Swelling    Sugar increase  . Triamterene Swelling  . Hydralazine Hcl Other (See Comments)    Fatigue; poor appetite  . Amoxicillin Rash  . Ampicillin Rash  . Carvedilol Other (See Comments)    Low heart rate     Patient Measurements: Height: 6\' 2"  (188 cm) IBW/kg (Calculated) : 82.2  Vital Signs: Temp: 98.1 F (36.7 C) (10/30 0530) Temp Source: Oral (10/30 0530) BP: 123/71 (10/30 0530) Pulse Rate: 106 (10/30 0530)  Labs:  Recent Labs  09/27/16 1108 09/27/16 1758 09/28/16 0232  HGB 13.1  --   --   HCT 39.8  --   --   PLT 125*  --   --   LABPROT 28.2*  --  29.0*  INR 2.58  --  2.67  CREATININE 1.69* 1.60* 1.81*    Estimated Creatinine Clearance: 44.1 mL/min (by C-G formula based on SCr of 1.81 mg/dL (H)).  Assessment: 75yom admitted for tikosyn initiation continues on coumadin from pta. INR therapeutic 2.67 on home dose. No bleeding.  PTA dose: 10mg  daily (last dose 10/28 PTA)  Goal of Therapy:  INR 2-3 Monitor platelets by anticoagulation protocol: Yes   Plan:  1) Continue coumadin 10mg  daily 2) Daily INR for now  Nena Jordan, PharmD, BCPS 09/28/2016 9:46 AM

## 2016-09-28 NOTE — Progress Notes (Addendum)
Patient QTC after 2nd dose of Tikosyn 460 by EKG. Will monitor patient. Thomas Marshall, Bettina Gavia RN  [place] paged through [facility] system to make aware of QTC measurement will await call back. Will monitor patient . Thomas Marshall, Bettina Gavia RN

## 2016-09-29 LAB — BASIC METABOLIC PANEL
Anion gap: 7 (ref 5–15)
BUN: 25 mg/dL — AB (ref 6–20)
CHLORIDE: 102 mmol/L (ref 101–111)
CO2: 27 mmol/L (ref 22–32)
CREATININE: 1.71 mg/dL — AB (ref 0.61–1.24)
Calcium: 8.6 mg/dL — ABNORMAL LOW (ref 8.9–10.3)
GFR calc Af Amer: 43 mL/min — ABNORMAL LOW (ref >60)
GFR calc non Af Amer: 37 mL/min — ABNORMAL LOW (ref >60)
Glucose, Bld: 153 mg/dL — ABNORMAL HIGH (ref 65–99)
Potassium: 4.9 mmol/L (ref 3.5–5.1)
SODIUM: 136 mmol/L (ref 135–145)

## 2016-09-29 LAB — GLUCOSE, CAPILLARY
GLUCOSE-CAPILLARY: 229 mg/dL — AB (ref 65–99)
GLUCOSE-CAPILLARY: 110 mg/dL — AB (ref 65–99)
GLUCOSE-CAPILLARY: 120 mg/dL — AB (ref 65–99)
Glucose-Capillary: 151 mg/dL — ABNORMAL HIGH (ref 65–99)

## 2016-09-29 LAB — PROTIME-INR
INR: 2.85
PROTHROMBIN TIME: 30.5 s — AB (ref 11.4–15.2)

## 2016-09-29 LAB — MAGNESIUM: MAGNESIUM: 1.9 mg/dL (ref 1.7–2.4)

## 2016-09-29 MED ORDER — METFORMIN HCL 500 MG PO TABS
1000.0000 mg | ORAL_TABLET | Freq: Two times a day (BID) | ORAL | Status: DC
  Administered 2016-09-29 – 2016-09-30 (×2): 1000 mg via ORAL
  Filled 2016-09-29 (×2): qty 2

## 2016-09-29 NOTE — Progress Notes (Signed)
QTc is 407 at 23:11 three hours after 20:00 dose of Tikosyn. Ekg shows AV block, however, on telemetry pt is now showing SR with a heart rate of 60. Pt is alert and asymptomatic.

## 2016-09-29 NOTE — Progress Notes (Signed)
ANTICOAGULATION CONSULT NOTE - Follow Up Consult  Pharmacy Consult for Coumadin Indication: atrial fibrillation  Allergies  Allergen Reactions  . Lisinopril Swelling and Other (See Comments)    Angioedema.   . Losartan Swelling and Other (See Comments)    Angioedema   . Nifedipine Swelling    Sugar increase  . Triamterene Swelling  . Hydralazine Hcl Other (See Comments)    Fatigue; poor appetite  . Amoxicillin Rash  . Ampicillin Rash  . Carvedilol Other (See Comments)    Low heart rate     Patient Measurements: Height: 6\' 2"  (188 cm) Weight: 221 lb 12.5 oz (100.6 kg) IBW/kg (Calculated) : 82.2  Vital Signs: Temp: 97.6 F (36.4 C) (10/31 0608) Temp Source: Oral (10/31 0608) BP: 138/66 (10/31 0608) Pulse Rate: 63 (10/31 0608)  Labs:  Recent Labs  09/27/16 1108 09/27/16 1758 09/28/16 0232 09/29/16 0144  HGB 13.1  --   --   --   HCT 39.8  --   --   --   PLT 125*  --   --   --   LABPROT 28.2*  --  29.0* 30.5*  INR 2.58  --  2.67 2.85  CREATININE 1.69* 1.60* 1.81* 1.71*    Estimated Creatinine Clearance: 45.8 mL/min (by C-G formula based on SCr of 1.71 mg/dL (H)).  Assessment: 75yom admitted for tikosyn initiation continues on coumadin from pta. INR therapeutic 2.85 on home dose. No bleeding.  PTA dose: 10mg  daily (last dose 10/28 PTA)  Goal of Therapy:  INR 2-3 Monitor platelets by anticoagulation protocol: Yes   Plan:  1) Continue coumadin 10mg  daily 2) Daily INR for now  Nena Jordan, PharmD, BCPS 09/29/2016 9:22 AM

## 2016-09-29 NOTE — Progress Notes (Signed)
Have spoken with Arlan Organ CSW at the [facility]- for DR. Borum- have faxed requested H&P and MD notes regarding Tikosyn to have Dr. Rondell Reams look over to see if [facility] will [facility] Tikosyn and approve. Faxed to [phone] to hear an answer from the [place] by tomorrow AM regarding approval- pt may need 14 day supply on Discharge from [facility] while he awaited for Approval from the [place]. Will know more tomorrow after his PCP there reviews notes.

## 2016-09-29 NOTE — Progress Notes (Signed)
Patient Name: Thomas Marshall Date of Encounter: 09/29/2016  Primary Cardiologist: Dr. Tyrell Antonio Problem List     Active Problems:   Hyperlipidemia   CKD (chronic kidney disease) stage 3, GFR 30-59 ml/min   CAD (coronary artery disease)   Essential hypertension   Paroxysmal atrial fibrillation with RVR (HCC)     Subjective   Sitting up in bed reading the paper. No complaints.   Inpatient Medications    Scheduled Meds: . aspirin EC  81 mg Oral Daily  . atorvastatin  40 mg Oral Daily  . cholecalciferol  1,000 Units Oral Daily  . cloNIDine  0.1 mg Oral BID  . dofetilide  250 mcg Oral BID  . insulin aspart  0-15 Units Subcutaneous TID WC  . magnesium oxide  200 mg Oral QID  . metFORMIN  1,000 mg Oral BID WC  . pantoprazole  40 mg Oral Daily  . sodium chloride flush  3 mL Intravenous Q12H  . terazosin  1 mg Oral QHS  . vitamin C  500 mg Oral Daily  . warfarin  10 mg Oral q1800  . Warfarin - Pharmacist Dosing Inpatient   Does not apply q1800   Continuous Infusions:   PRN Meds: sodium chloride, nitroGLYCERIN, sodium chloride flush   Vital Signs    Vitals:   09/28/16 1317 09/28/16 2000 09/28/16 2100 09/29/16 0608  BP: (!) 118/55 126/84  138/66  Pulse: 69 (!) 130  63  Resp: 18 18  18   Temp: 98.9 F (37.2 C) 98 F (36.7 C)  97.6 F (36.4 C)  TempSrc: Oral Oral  Oral  SpO2: 100% 97%  99%  Weight:   221 lb 12.5 oz (100.6 kg)   Height:        Intake/Output Summary (Last 24 hours) at 09/29/16 0943 Last data filed at 09/28/16 1630  Gross per 24 hour  Intake              960 ml  Output              651 ml  Net              309 ml   Filed Weights   09/28/16 2100  Weight: 221 lb 12.5 oz (100.6 kg)    Physical Exam   GEN: Well nourished, well developed, in no acute distress.  HEENT: Grossly normal.  Neck: Supple, no JVD, carotid bruits, or masses. Cardiac: RRR no murmurs, rubs, or gallops. No clubbing, cyanosis, edema.  Radials/DP/PT 2+ and equal  bilaterally.  Respiratory:  Respirations regular and unlabored, clear to auscultation bilaterally. GI: Soft, nontender, nondistended, BS + x 4. MS: no deformity or atrophy. Skin: warm and dry, no rash. Neuro:  Strength and sensation are intact. Psych: AAOx3.  Normal affect.  Labs    CBC  Recent Labs  09/27/16 1108  WBC 5.3  HGB 13.1  HCT 39.8  MCV 91.7  PLT 562*   Basic Metabolic Panel  Recent Labs  09/28/16 0232 09/29/16 0144  NA 137 136  K 5.2* 4.9  CL 102 102  CO2 29 27  GLUCOSE 137* 153*  BUN 21* 25*  CREATININE 1.81* 1.71*  CALCIUM 8.7* 8.6*  MG 1.9 1.9   Liver Function Tests No results for input(s): AST, ALT, ALKPHOS, BILITOT, PROT, ALBUMIN in the last 72 hours. No results for input(s): LIPASE, AMYLASE in the last 72 hours. Cardiac Enzymes No results for input(s): CKTOTAL, CKMB, CKMBINDEX, TROPONINI in the last  72 hours. BNP Invalid input(s): POCBNP D-Dimer No results for input(s): DDIMER in the last 72 hours. Hemoglobin A1C No results for input(s): HGBA1C in the last 72 hours. Fasting Lipid Panel No results for input(s): CHOL, HDL, LDLCALC, TRIG, CHOLHDL, LDLDIRECT in the last 72 hours. Thyroid Function Tests No results for input(s): TSH, T4TOTAL, T3FREE, THYROIDAB in the last 72 hours.  Invalid input(s): FREET3  Telemetry    SR- Personally Reviewed  ECG    AF 10/30- Personally Reviewed  Radiology    Dg Chest Port 1 View  Result Date: 09/27/2016 CLINICAL DATA:  Atrial fibrillation, dizziness, fatigue EXAM: PORTABLE CHEST 1 VIEW COMPARISON:  04/19/2016 FINDINGS: Lungs are clear.  No pleural effusion or pneumothorax. The heart is normal in size. Postsurgical changes related to prior CABG.  Median sternotomy. IMPRESSION: No evidence of acute cardiopulmonary disease. Electronically Signed   By: Julian Hy M.D.   On: 09/27/2016 11:57    Cardiac Studies     Patient Profile     75 year old Caucasian male with past medical history of  hypertension, hyperlipidemia, type 2 diabetes, paroxysmal atrial fibrillation on Coumadin, and history of coronary artery disease s/p CABG in 2004 presented with recurrent atrial fibrillation with RVR after recent cardioversion back in May 2017.  Assessment & Plan    1.  Atrial fib with RVR: Now in SR converted last night. Rates in the 50-60s.             - Compliant with Coumadin at home, INR therapeutic at 2.58             - His calculated creatinine clearance is 54, and was started at 250 g twice a day of Tikosyn. We'll monitor QT interval closely. Has had 4 doses thus far.             - This patients CHA2DS2-VASc Score and unadjusted Ischemic Stroke Rate (% per year) is equal to 7.2 % stroke rate/year from a score of 5  Above score calculated as 1 point each if present [CHF, HTN, DM, Vascular=MI/PAD/Aortic Plaque, Age if 65-74, or Male] Above score calculated as 2 points each if present [Age > 75, or Stroke/TIA/TE]  2. Coronary artery disease s/p CABG in 2004: No obvious angina 3. Hypertension: Blood pressure currently stable. 4. Hyperlipidemia: On Lipitor 5. type 2 diabetes:  Pt was recently started back on metformin 1 g bid as Hgb A1C was 8.  Discussed with pharmacy  OK to resume as Cr Cl is OK.  6. Hyperkalemia: improved today 4.9.  7. CKD: Slight increase in Cr 1.6>>1.8>>1.7. Monitor BMET 8  Sleep apnea  Had test this summer --  IMPRESSIONS - Mild obstructive sleep apnea (AHI = 7.0/h); however, sleep apnea was severe during supine sleep with an AHI of 43.9/h. - No significant central sleep apnea occurred during this study (CAI = 2.0/h). - Mild oxygen desaturation  to a nadir of 87.00%. - Abnormal sleep architecture with absence of slow-wave sleep and prolonged latency to in REM sleep. - Reduced sleep efficiency. - The patient snored with Soft snoring volume. - No cardiac abnormalities were noted during this study. - Mild periodic limb movements of sleep occurred during the  study. No significant associated arousals. RECOMMENDATIONS - In this patient with significant cardiovascular comorbidities including CAD, status post CABG revascularization, and PAF, recommend CPAP titration for treatment of street sleep disordered breathing. - Effort should be made to optimize nasal and oropharyngeal patency. - The patient should be counseled to avoid  supine sleep and consider positional therapy - Avoid alcohol, sedatives and other CNS depressants that may worsen sleep apnea and disrupt normal sleep architecture. - Sleep hygiene should be reviewed to assess factors that may improve sleep quality. - Weight management and regular exercise should be initiated or continued if appropriate.  Signed, Reino Bellis, NP  09/29/2016, 9:43 AM   Pt seen and examined  I agree with findings as noted above by L Roberts  Pt back in SR  Will complete Tikosyn load Sleep study results as noted above  WIll need to be set up for CPAP trial as outpt. Review with pharmacy current meds     On exam  Lungs CTA  Cardiac RRR  No S3  Ext no edema    Dorris Carnes

## 2016-09-30 LAB — BASIC METABOLIC PANEL
ANION GAP: 8 (ref 5–15)
BUN: 23 mg/dL — AB (ref 6–20)
CALCIUM: 8.7 mg/dL — AB (ref 8.9–10.3)
CO2: 26 mmol/L (ref 22–32)
Chloride: 102 mmol/L (ref 101–111)
Creatinine, Ser: 1.63 mg/dL — ABNORMAL HIGH (ref 0.61–1.24)
GFR calc Af Amer: 45 mL/min — ABNORMAL LOW (ref >60)
GFR, EST NON AFRICAN AMERICAN: 39 mL/min — AB (ref >60)
GLUCOSE: 127 mg/dL — AB (ref 65–99)
POTASSIUM: 4.9 mmol/L (ref 3.5–5.1)
SODIUM: 136 mmol/L (ref 135–145)

## 2016-09-30 LAB — GLUCOSE, CAPILLARY
GLUCOSE-CAPILLARY: 220 mg/dL — AB (ref 65–99)
Glucose-Capillary: 142 mg/dL — ABNORMAL HIGH (ref 65–99)

## 2016-09-30 LAB — PROTIME-INR
INR: 2.93
PROTHROMBIN TIME: 31.2 s — AB (ref 11.4–15.2)

## 2016-09-30 LAB — MAGNESIUM: MAGNESIUM: 1.9 mg/dL (ref 1.7–2.4)

## 2016-09-30 MED ORDER — DOFETILIDE 250 MCG PO CAPS: 250.0000 ug | ORAL_CAPSULE | Freq: Two times a day (BID) | ORAL | 0 refills | Status: DC

## 2016-09-30 MED ORDER — DOFETILIDE 250 MCG PO CAPS: 250.0000 ug | ORAL_CAPSULE | Freq: Two times a day (BID) | ORAL | 6 refills | Status: DC

## 2016-09-30 NOTE — Progress Notes (Signed)
Orders to discharge received. IV and telemetry removed, CCMD notified.  Pt to receive 14 day supply of Tikosyn from inpatient pharmacy prior to discharge.  Christy with [employer] continuing to follow up with [employer] regarding pt coverage and continuing prescription.

## 2016-09-30 NOTE — Progress Notes (Signed)
Patient Name: Thomas Marshall Date of Encounter: 09/30/2016  Primary Cardiologist: Dr. Tyrell Antonio Problem List     Active Problems:   Hyperlipidemia   CKD (chronic kidney disease) stage 3, GFR 30-59 ml/min   CAD (coronary artery disease)   Essential hypertension   Paroxysmal atrial fibrillation with RVR (HCC)     Subjective   No complaints.   Inpatient Medications    Scheduled Meds: . aspirin EC  81 mg Oral Daily  . atorvastatin  40 mg Oral Daily  . cholecalciferol  1,000 Units Oral Daily  . cloNIDine  0.1 mg Oral BID  . dofetilide  250 mcg Oral BID  . insulin aspart  0-15 Units Subcutaneous TID WC  . magnesium oxide  200 mg Oral QID  . metFORMIN  1,000 mg Oral BID WC  . pantoprazole  40 mg Oral Daily  . sodium chloride flush  3 mL Intravenous Q12H  . terazosin  1 mg Oral QHS  . vitamin C  500 mg Oral Daily  . warfarin  10 mg Oral q1800  . Warfarin - Pharmacist Dosing Inpatient   Does not apply q1800   Continuous Infusions:   PRN Meds: sodium chloride, nitroGLYCERIN, sodium chloride flush   Vital Signs    Vitals:   09/29/16 1120 09/29/16 1425 09/29/16 2032 09/30/16 0617  BP: (!) 152/101 (!) 165/70 (!) 149/69 113/62  Pulse:  60 (!) 53 63  Resp:  18 18 18   Temp:  98.2 F (36.8 C) 97.3 F (36.3 C) 98.3 F (36.8 C)  TempSrc:  Oral Oral Oral  SpO2:  100% 98% 96%  Weight:      Height:        Intake/Output Summary (Last 24 hours) at 09/30/16 0954 Last data filed at 09/29/16 1630  Gross per 24 hour  Intake              840 ml  Output                0 ml  Net              840 ml   Filed Weights   09/28/16 2100  Weight: 221 lb 12.5 oz (100.6 kg)    Physical Exam   GEN: Well nourished, well developed, in no acute distress.  HEENT: Grossly normal.  Neck: Supple, no JVD, carotid bruits, or masses. Cardiac: RRR no murmurs, rubs, or gallops. No clubbing, cyanosis, edema.  Radials/DP/PT 2+ and equal bilaterally.  Respiratory:  Respirations regular  and unlabored, clear to auscultation bilaterally. GI: Soft, nontender, nondistended, BS + x 4. MS: no deformity or atrophy. Skin: warm and dry, no rash. Neuro:  Strength and sensation are intact. Psych: AAOx3.  Normal affect.  Labs    CBC  Recent Labs  09/27/16 1108  WBC 5.3  HGB 13.1  HCT 39.8  MCV 91.7  PLT 833*   Basic Metabolic Panel  Recent Labs  09/29/16 0144 09/30/16 0230  NA 136 136  K 4.9 4.9  CL 102 102  CO2 27 26  GLUCOSE 153* 127*  BUN 25* 23*  CREATININE 1.71* 1.63*  CALCIUM 8.6* 8.7*  MG 1.9 1.9   Liver Function Tests No results for input(s): AST, ALT, ALKPHOS, BILITOT, PROT, ALBUMIN in the last 72 hours. No results for input(s): LIPASE, AMYLASE in the last 72 hours. Cardiac Enzymes No results for input(s): CKTOTAL, CKMB, CKMBINDEX, TROPONINI in the last 72 hours. BNP Invalid input(s): POCBNP D-Dimer No  results for input(s): DDIMER in the last 72 hours. Hemoglobin A1C No results for input(s): HGBA1C in the last 72 hours. Fasting Lipid Panel No results for input(s): CHOL, HDL, LDLCALC, TRIG, CHOLHDL, LDLDIRECT in the last 72 hours. Thyroid Function Tests No results for input(s): TSH, T4TOTAL, T3FREE, THYROIDAB in the last 72 hours.  Invalid input(s): FREET3  Telemetry    SR- Personally Reviewed  ECG    AF 10/30- Personally Reviewed  Radiology    No results found.  Cardiac Studies     Patient Profile     75 year old Caucasian male with past medical history of hypertension, hyperlipidemia, type 2 diabetes, paroxysmal atrial fibrillation on Coumadin, and history of coronary artery disease s/p CABG in 2004 presented with recurrent atrial fibrillation with RVR after recent cardioversion back in May 2017.  Assessment & Plan    1.  Atrial fib with RVR: Now in SR converted last night. Rates in the 50-60s.             - Compliant with Coumadin at home, INR therapeutic at 2.58             - His calculated creatinine clearance is 54, and  was started at 250 g twice a day of Tikosyn. QT interval has been ok, completed load.              - This patients CHA2DS2-VASc Score and unadjusted Ischemic Stroke Rate (% per year) is equal to 7.2 % stroke rate/year from a score of 5  Above score calculated as 1 point each if present [CHF, HTN, DM, Vascular=MI/PAD/Aortic Plaque, Age if 65-74, or Male] Above score calculated as 2 points each if present [Age > 75, or Stroke/TIA/TE] -- Case management working with the [place] on approval.   2. Coronary artery disease s/p CABG in 2004: No obvious angina 3. Hypertension: Blood pressure currently stable. 4. Hyperlipidemia: On Lipitor 5. type 2 diabetes:  Pt was recently started back on metformin 1 g bid as Hgb A1C was 8.  Discussed with pharmacy  OK to resume as Cr Cl is OK.  6. Hyperkalemia: improved today 4.9.  7. CKD: Stable. 1.6 today.  8  Sleep apnea  Had test this summer, will need CPAP trial as outpt.   Signed, Reino Bellis, NP  09/30/2016, 9:54 AM   Pt seen and examined  I agree witn findings as noted by L Roberts  Pt remains in SR   Cardiac exam:  RRR    Ext without edema  Plan on d/c on current dose of Tikosyn WIll need to have CPAP trial as outpt for sleep apnea  Dorris Carnes

## 2016-09-30 NOTE — Progress Notes (Signed)
Discharge instructions reviewed with the patient to include medications, follow up appointments, Prescriptions and activity.  Printed copies given to patient.  A 14 day supply of tikosyn picked up from the pharmacy and given to the patient.  To door via wheelchair.  Home via [place] with his wife driving.

## 2016-09-30 NOTE — Discharge Summary (Signed)
Discharge Summary    Patient ID: Thomas Marshall,  MRN: [id number], DOB/AGE: February 27, 1941 75 y.o.  Admit date: 09/27/2016 Discharge date: 09/30/2016  Primary Care Provider: Cathlean Cower Primary Cardiologist: Dr. Burt Knack  Discharge Diagnoses    Active Problems:   Hyperlipidemia   CKD (chronic kidney disease) stage 3, GFR 30-59 ml/min   CAD (coronary artery disease)   Essential hypertension   Paroxysmal atrial fibrillation with RVR (HCC)   Allergies Allergies  Allergen Reactions  . Lisinopril Swelling and Other (See Comments)    Angioedema.   . Losartan Swelling and Other (See Comments)    Angioedema   . Nifedipine Swelling    Sugar increase  . Triamterene Swelling  . Hydralazine Hcl Other (See Comments)    Fatigue; poor appetite  . Amoxicillin Rash  . Ampicillin Rash  . Carvedilol Other (See Comments)    Low heart rate     Diagnostic Studies/Procedures    None _____________   History of Present Illness     Thomas Marshall is a 75 year old Caucasian male with past medical history of hypertension, hyperlipidemia, type 2 diabetes, paroxysmal atrial fibrillation on Coumadin, and history of coronary artery disease s/p CABG in 2004. He also has stage III chronic kidney disease. His last cardioversion was in May 2017. Despite having no awareness of palpitation, he usually present with symptoms significant fatigue if he is in atrial fibrillation. His last office visit with Dr. Burt Knack on 07/03/2016, at that time, he was still having some generalized fatigue however was maintaining sinus rhythm. Dr. Burt Knack recommended potentially consider Tikosyn if he has any recurrence. He has multiple antihypertensive drug intolerance.  According to the patient, he woke up the morning of admission having a headache and some dizziness. He also had worsening fatigue as well. He has been compliant with his medication. His wife contacted after hour answering service on 09/27/2016 and was instructed to seek  medical attention in the emergency room as despite a normal vital sign, he was having a lot of symptom while in atrial fibrillation. While in the emergency room, he was noted to be atrial flutter ablation with RVR with heart rate of 130s.   His INR has been therapeutic in the last month. Significant laboratory finding on this visit include creatinine of 1.69. Sodium 134. Normal white blood cell count, hemoglobin, platelet 125. Otherwise he denies any chest discomfort. He denies any significant shortness of breath. His chest x-ray showed no acute process. INR is 2.58. Cardiology has been consulted for atrial fibrillation with RVR.  Hospital Course     Consultants: None   He was admitted with plans to start on Tikosyn at 268mcg BID, and follow QTc interval. After 3 doses he converted to SR with rates in the 50-60s. QTc remained stable, along with electrolytes. He was continued on coumadin for anticoagulation.   On 11/1 he was seen by Dr. Harrington Challenger and determined stable for discharge home. He will be discharged home with a 14 day supply from the [facility], and paper scripts for the [place]. Case manager was called with assistance for PA with primary MD through the [place]. Case manager will follow up with [place] regarding approval. Follow up has been arranged in the AF clinic next week for repeat EKG and labs. Of note his spironolactone was held this admission in the setting of hyperkalemia. Could consider adding back in the outpatient setting.  _____________  Discharge Vitals Blood pressure (!) 124/50, pulse (!) 55, temperature 97.5  F (36.4 C), temperature source Oral, resp. rate 20, height 6\' 2"  (1.88 m), weight 221 lb 12.5 oz (100.6 kg), SpO2 98 %.  Filed Weights   09/28/16 2100  Weight: 221 lb 12.5 oz (100.6 kg)    Labs & Radiologic Studies    CBC No results for input(s): WBC, NEUTROABS, HGB, HCT, MCV, PLT in the last 72 hours. Basic Metabolic Panel  Recent Labs  09/29/16 0144 09/30/16 0230    NA 136 136  K 4.9 4.9  CL 102 102  CO2 27 26  GLUCOSE 153* 127*  BUN 25* 23*  CREATININE 1.71* 1.63*  CALCIUM 8.6* 8.7*  MG 1.9 1.9   Liver Function Tests No results for input(s): AST, ALT, ALKPHOS, BILITOT, PROT, ALBUMIN in the last 72 hours. No results for input(s): LIPASE, AMYLASE in the last 72 hours. Cardiac Enzymes No results for input(s): CKTOTAL, CKMB, CKMBINDEX, TROPONINI in the last 72 hours. BNP Invalid input(s): POCBNP D-Dimer No results for input(s): DDIMER in the last 72 hours. Hemoglobin A1C No results for input(s): HGBA1C in the last 72 hours. Fasting Lipid Panel No results for input(s): CHOL, HDL, LDLCALC, TRIG, CHOLHDL, LDLDIRECT in the last 72 hours. Thyroid Function Tests No results for input(s): TSH, T4TOTAL, T3FREE, THYROIDAB in the last 72 hours.  Invalid input(s): FREET3 _____________  Dg Chest Port 1 View  Result Date: 09/27/2016 CLINICAL DATA:  Atrial fibrillation, dizziness, fatigue EXAM: PORTABLE CHEST 1 VIEW COMPARISON:  04/19/2016 FINDINGS: Lungs are clear.  No pleural effusion or pneumothorax. The heart is normal in size. Postsurgical changes related to prior CABG.  Median sternotomy. IMPRESSION: No evidence of acute cardiopulmonary disease. Electronically Signed   By: Julian Hy M.D.   On: 09/27/2016 11:57   Disposition   Pt is being discharged home today in good condition.  Follow-up Plans & Appointments    Follow-up Information    CARROLL,DONNA, NP Follow up on 10/07/2016.   Specialties:  Nurse Practitioner, Cardiology Why:  at 10am in the [facility] for your follow up.  Contact information: [place] [address] [phone]          Discharge Instructions    Diet - low sodium heart healthy    Complete by:  As directed    Discharge instructions    Complete by:  As directed    Please do not resume your spironolactone at home as your potassium was slightly elevated this admission.   Increase activity  slowly    Complete by:  As directed       Discharge Medications   Current Discharge Medication List    START taking these medications   Details  dofetilide (TIKOSYN) 250 MCG capsule Take 1 capsule (250 mcg total) by mouth 2 (two) times daily. Qty: 60 capsule, Refills: 6      CONTINUE these medications which have NOT CHANGED   Details  aspirin 81 MG tablet Take 81 mg by mouth daily.    atorvastatin (LIPITOR) 40 MG tablet Take 40 mg by mouth daily.    cholecalciferol (VITAMIN D) 1000 UNITS tablet Take 1,000 Units by mouth daily.    cloNIDine (CATAPRES) 0.1 MG tablet TAKE ONE TABLET BY MOUTH TWICE DAILY Qty: 180 tablet, Refills: 2    magnesium oxide (MAG-OX) 400 MG tablet Take 1 tablet by mouth 4 times a day Qty: 120 tablet, Refills: 2    metFORMIN (GLUCOPHAGE) 1000 MG tablet Take 1 tablet (1,000 mg total) by mouth 2 (two) times daily with  a meal. Qty: 180 tablet, Refills: 3   Associated Diagnoses: Type 2 diabetes mellitus with stage 2 chronic kidney disease, without long-term current use of insulin (HCC)    omeprazole (PRILOSEC) 20 MG capsule Take 20 mg by mouth daily.    terazosin (HYTRIN) 1 MG capsule Take 1 capsule (1 mg total) by mouth at bedtime. Qty: 30 capsule, Refills: 11   Associated Diagnoses: Essential hypertension    vitamin C (ASCORBIC ACID) 500 MG tablet Take 500 mg by mouth daily.    glucose blood test strip Use to test blood glucose level 2 times a day. Qty: 100 each, Refills: 12   Associated Diagnoses: Type 2 diabetes mellitus with stage 2 chronic kidney disease, without long-term current use of insulin (HCC)    nitroGLYCERIN (NITROSTAT) 0.4 MG SL tablet Place 1 tablet (0.4 mg total) under the tongue every 5 (five) minutes as needed for chest pain (x 3 doses). Qty: 30 tablet, Refills: 3    sildenafil (VIAGRA) 100 MG tablet Take 0.5-1 tablets (50-100 mg total) by mouth daily as needed for erectile dysfunction. Qty: 5 tablet, Refills: 11    warfarin  (COUMADIN) 10 MG tablet Take as directed by Coumadin Clinic Qty: 100 tablet, Refills: 1      STOP taking these medications     spironolactone (ALDACTONE) 25 MG tablet      traMADol (ULTRAM) 50 MG tablet            Outstanding Labs/Studies   Potassium, and Mag along with EKG at AF clinic follow up appt.   Duration of Discharge Encounter   Greater than 30 minutes including physician time.  Signed, Reino Bellis NP-C 09/30/2016, 1:21 PM

## 2016-09-30 NOTE — Care Management Note (Addendum)
Case Management Note Marvetta Gibbons RN, BSN Unit 2W-Case Manager [phone]  Patient Details  Name: Thomas Marshall MRN: [id number] Date of Birth: [birth year]  Subjective/Objective:  Pt admitted with afib- for Tikosyn load                  Action/Plan: PTA pt lived at home with wife- anticipate return home- referral received for Tikosyn needs- per conversation with pt at bedside- he does not have any prescription drug coverage - NO PART D- pt states that he gets his meds via mail order through the [place] [facility]) or pays out of pocket for inexpensive medications. Pt's PCP at the [facility] is  Dr. Rondell Reams- have called the [place] clinic and left msg- awaiting return call- pt will need prior approval from the [place] for Tikosyn. - States that if [place] does not cover drug can not afford out of pocket cost- Pt could be discharged with a 14 day supply of Tikosyn [facility] needed [facility] the [facility] to to through the approval process- will await return call from [place] to see what needs to be done.   Expected Discharge Date:              Expected Discharge Plan:  Home/Self Care  In-House Referral:     Discharge planning Services  Medication Assistance, CM Consult  Post Acute Care Choice:    Choice offered to:     DME Arranged:    DME Agency:     HH Arranged:    HH Agency:     Status of Service:  Completed, signed off  If discussed at [employer] of Stay Meetings, dates discussed:    Additional Comments:  10/07/16- f/u done post discharge with the [facility]- spoke with Arlan Organ- pt has been approved for Tikosyn by the [facility] x21mo then will f/u with the Cardiologist at the [facility]- call made to the pt- [phone]- who has been in touch with the [place] and is aware that he is approved short term for now.   09/30/16- 1400- Azana Kiesler RN, CM- have heard from the [place] today- pt's PCP has reviewed notes and has had to forward them to the cardiologist at the [facility] for approval- awaiting final  word from the [place]- will plan to send pt home with 14 day supply from [facility]- to allow time for [facility] to [facility] if approved- have faxed script to the [place] for them to have if approved. Will call pt at home once [place] returns call with decision regarding Tikosyn- pt has f/u with Cardiologist here if [place] does not approve drug can speak with MD then regarding alternate plan.   Dawayne Patricia, RN 09/30/2016, 11:23 PM

## 2016-09-30 NOTE — Progress Notes (Signed)
Duplicate note

## 2016-10-02 ENCOUNTER — Telehealth: Payer: Self-pay | Admitting: *Deleted

## 2016-10-02 NOTE — Telephone Encounter (Signed)
Pt called to inform us that he will be starting Eliquis per the [place]. He stated that they will follow him once he is started. He has not started the Eliquis at this because the [place] office is checking his labs. If he does not start soon he will continue to take Coumadin until they tell him when to switch over. Pt states he will call either way & let us know & will keep next appt with [place] until he switches over or cancel once he switches.

## 2016-10-02 NOTE — Telephone Encounter (Signed)
Pt was on TCM list admitted for atrial flutter ablation with RVR with heart rate of 130s. Admitted with plans to start on Tikosyn at 248mcg BID, and follow QTc interval. After 3 doses he converted to SR with rates in the 50-60s. QTc remained stable, along with electrolytes. He was continued on coumadin for anticoagulation. D/C 11/1, and will follow-up w/cardiology on 10/07/16...Johny Chess

## 2016-10-05 ENCOUNTER — Other Ambulatory Visit: Payer: Self-pay | Admitting: Cardiovascular Disease

## 2016-10-07 ENCOUNTER — Ambulatory Visit (HOSPITAL_COMMUNITY)
Admit: 2016-10-07 | Discharge: 2016-10-07 | Disposition: A | Discharge status: Home / Self Care | Payer: Medicare Other | Attending: Nurse Practitioner | Admitting: Nurse Practitioner

## 2016-10-07 ENCOUNTER — Encounter (HOSPITAL_COMMUNITY): Payer: Self-pay | Admitting: Nurse Practitioner

## 2016-10-07 VITALS — BP 172/74 | HR 88 | Ht 74.0 in | Wt 229.4 lb

## 2016-10-07 DIAGNOSIS — R001 Bradycardia, unspecified: Secondary | ICD-10-CM | POA: Diagnosis not present

## 2016-10-07 DIAGNOSIS — Z87891 Personal history of nicotine dependence: Secondary | ICD-10-CM | POA: Diagnosis not present

## 2016-10-07 DIAGNOSIS — E1122 Type 2 diabetes mellitus with diabetic chronic kidney disease: Secondary | ICD-10-CM | POA: Insufficient documentation

## 2016-10-07 DIAGNOSIS — M1990 Unspecified osteoarthritis, unspecified site: Secondary | ICD-10-CM | POA: Insufficient documentation

## 2016-10-07 DIAGNOSIS — Z951 Presence of aortocoronary bypass graft: Secondary | ICD-10-CM | POA: Diagnosis not present

## 2016-10-07 DIAGNOSIS — I7789 Other specified disorders of arteries and arterioles: Secondary | ICD-10-CM | POA: Insufficient documentation

## 2016-10-07 DIAGNOSIS — I4891 Unspecified atrial fibrillation: Secondary | ICD-10-CM | POA: Diagnosis not present

## 2016-10-07 DIAGNOSIS — Z7982 Long term (current) use of aspirin: Secondary | ICD-10-CM | POA: Diagnosis not present

## 2016-10-07 DIAGNOSIS — I480 Paroxysmal atrial fibrillation: Secondary | ICD-10-CM | POA: Diagnosis not present

## 2016-10-07 DIAGNOSIS — I2510 Atherosclerotic heart disease of native coronary artery without angina pectoris: Secondary | ICD-10-CM | POA: Insufficient documentation

## 2016-10-07 DIAGNOSIS — Z85828 Personal history of other malignant neoplasm of skin: Secondary | ICD-10-CM | POA: Insufficient documentation

## 2016-10-07 DIAGNOSIS — Z9049 Acquired absence of other specified parts of digestive tract: Secondary | ICD-10-CM | POA: Insufficient documentation

## 2016-10-07 DIAGNOSIS — I714 Abdominal aortic aneurysm, without rupture: Secondary | ICD-10-CM | POA: Diagnosis not present

## 2016-10-07 DIAGNOSIS — K219 Gastro-esophageal reflux disease without esophagitis: Secondary | ICD-10-CM | POA: Diagnosis not present

## 2016-10-07 DIAGNOSIS — N183 Chronic kidney disease, stage 3 (moderate): Secondary | ICD-10-CM | POA: Diagnosis not present

## 2016-10-07 DIAGNOSIS — Z7901 Long term (current) use of anticoagulants: Secondary | ICD-10-CM | POA: Diagnosis not present

## 2016-10-07 DIAGNOSIS — E785 Hyperlipidemia, unspecified: Secondary | ICD-10-CM | POA: Diagnosis not present

## 2016-10-07 DIAGNOSIS — I129 Hypertensive chronic kidney disease with stage 1 through stage 4 chronic kidney disease, or unspecified chronic kidney disease: Secondary | ICD-10-CM | POA: Insufficient documentation

## 2016-10-07 DIAGNOSIS — Z7984 Long term (current) use of oral hypoglycemic drugs: Secondary | ICD-10-CM | POA: Diagnosis not present

## 2016-10-07 LAB — MAGNESIUM: MAGNESIUM: 1.5 mg/dL — AB (ref 1.7–2.4)

## 2016-10-07 LAB — BASIC METABOLIC PANEL
ANION GAP: 9 (ref 5–15)
BUN: 23 mg/dL — AB (ref 6–20)
CHLORIDE: 104 mmol/L (ref 101–111)
CO2: 25 mmol/L (ref 22–32)
Calcium: 9.2 mg/dL (ref 8.9–10.3)
Creatinine, Ser: 1.75 mg/dL — ABNORMAL HIGH (ref 0.61–1.24)
GFR calc Af Amer: 42 mL/min — ABNORMAL LOW (ref >60)
GFR, EST NON AFRICAN AMERICAN: 36 mL/min — AB (ref >60)
Glucose, Bld: 194 mg/dL — ABNORMAL HIGH (ref 65–99)
POTASSIUM: 4.4 mmol/L (ref 3.5–5.1)
SODIUM: 138 mmol/L (ref 135–145)

## 2016-10-07 NOTE — Progress Notes (Addendum)
Primary Care Physician: Cathlean Cower, MD Referring Physician:[facility] f/u   Thomas Marshall is a 75 y.o. male with a h/o paroxysmal afib that is in the afib clinic for f/u. He was admitted to [facility] 10/29 to 11/1 after tikosyn initiation.  He was d/ced on 250 mcg of tikosyn  and qtc stayed stable. He has already been to [facility] and received his tikosyn. He has not noticed any afib on f/u. He is aware of general precautions with tikosyn. He was started back on spironolactone at the [place]. BP initially elevated but rechecked at 140/60.  Today, he denies symptoms of palpitations, chest pain, shortness of breath, orthopnea, PND, lower extremity edema, dizziness, presyncope, syncope, or neurologic sequela. The patient is tolerating medications without difficulties and is otherwise without complaint today.   Past Medical History:  Diagnosis Date  . AAA (abdominal aortic aneurysm) ([place])   . Arthritis   . Cancer (HCC)    skin & squamous cell  . Carotid artery disease ([place])    a. s/p R CEA 12/2015.  [name] CKD (chronic kidney disease), stage III    a. per historical labs.  . Coronary artery disease    a. s/p CABG 2004.  . Diabetes mellitus without complication ([place])   . GERD (gastroesophageal reflux disease)   . Hyperlipidemia   . Hypertension   . Medication intolerance Multiple   . PAF (paroxysmal atrial fibrillation) ([place])   . Pneumonia   . Sinus bradycardia    a. baseline HR 50s-60s, also h/o bradycardia on metoprolol and carvedilol   Past Surgical History:  Procedure Laterality Date  . ANKLE SURGERY     right fused  . BACK SURGERY     low back   . CHOLECYSTECTOMY N/A 12/07/2013   Procedure: LAPAROSCOPIC CHOLECYSTECTOMY ;  Surgeon: [name], MD;  Location: [facility];  Service: General;  Laterality: N/A;  . COLONOSCOPY    . CORONARY ARTERY BYPASS GRAFT  2004   [place]  . ENDARTERECTOMY Right 12/10/2015   Procedure: Right Carotid ENDARTERECTOMY;  Surgeon: Conrad Tamaroa, MD;   Location: [facility];  Service: Vascular;  Laterality: Right;  . EXPLORATION POST OPERATIVE OPEN HEART    . FINGER SURGERY     skin graft on rt index  . FINGER SURGERY Right    right index finger.  [name] HERNIA REPAIR Right    RIH  . JOINT REPLACEMENT    . KNEE SURGERY     replacement on both knees  . PROSTATE SURGERY    . TONSILLECTOMY    . URINARY SURGERY     scar tissue    Current Outpatient Prescriptions  Medication Sig Dispense Refill  . aspirin 81 MG tablet Take 81 mg by mouth daily.    [name] atorvastatin (LIPITOR) 40 MG tablet Take 40 mg by mouth daily.    . cholecalciferol (VITAMIN D) 1000 UNITS tablet Take 1,000 Units by mouth daily.    . cloNIDine (CATAPRES) 0.1 MG tablet TAKE ONE TABLET BY MOUTH TWICE DAILY 180 tablet 2  . dofetilide (TIKOSYN) 250 MCG capsule Take 1 capsule (250 mcg total) by mouth 2 (two) times daily. 60 capsule 6  . glucose blood test strip Use to test blood glucose level 2 times a day. 100 each 12  . magnesium oxide (MAG-OX) 400 MG tablet TAKE ONE TABLET BY MOUTH 4 TIMES DAILY 120 tablet 6  . metFORMIN (GLUCOPHAGE) 1000 MG tablet Take 1 tablet (1,000 mg total) by mouth  2 (two) times daily with a meal. 180 tablet 3  . nitroGLYCERIN (NITROSTAT) 0.4 MG SL tablet Place 1 tablet (0.4 mg total) under the tongue every 5 (five) minutes as needed for chest pain (x 3 doses). 30 tablet 3  . omeprazole (PRILOSEC) 20 MG capsule Take 20 mg by mouth daily.    [name] terazosin (HYTRIN) 1 MG capsule Take 1 capsule (1 mg total) by mouth at bedtime. 30 capsule 11  . vitamin C (ASCORBIC ACID) 500 MG tablet Take 500 mg by mouth daily.    [name] warfarin (COUMADIN) 10 MG tablet Take as directed by [facility] 100 tablet 1   No current facility-administered medications for this encounter.     Allergies  Allergen Reactions  . Lisinopril Swelling and Other (See Comments)    Angioedema.   . Losartan Swelling and Other (See Comments)    Angioedema   . Nifedipine Swelling    Sugar  increase  . Triamterene Swelling  . Hydralazine Hcl Other (See Comments)    Fatigue; poor appetite  . Amoxicillin Rash  . Ampicillin Rash  . Carvedilol Other (See Comments)    Low heart rate     Social History   Social History  . Marital status: Married    Spouse name: N/A  . Number of children: N/A  . Years of education: N/A   Occupational History  . Not on file.   Social History Main Topics  . Smoking status: Former Smoker    Packs/day: 1.00    Years: 13.00    Quit date: 11/30/1970  . Smokeless tobacco: Never Used  . Alcohol use No  . Drug use: No  . Sexual activity: Yes   Other Topics Concern  . Not on file   Social History Narrative  . No narrative on file    Family History  Problem Relation Age of Onset  . Stroke Mother   . Heart disease Mother   . Heart disease Father   . Hypertension Father   . Hyperlipidemia Father   . Cancer Sister     breast  and skin  . Diabetes Sister   . Hypertension Sister   . Diabetes Brother   . Heart disease Brother   . Hyperlipidemia Brother   . Hypertension Brother   . Early death Neg Hx     ROS- All systems are reviewed and negative except as per the HPI above  Physical Exam: Vitals:   10/07/16 0945  BP: (!) 172/74  Pulse: 88  Weight: 229 lb 6.4 oz (104.1 kg)  Height: 6\' 2"  (1.88 m)    GEN- The patient is well appearing, alert and oriented x 3 today.   Head- normocephalic, atraumatic Eyes-  Sclera clear, conjunctiva pink Ears- hearing intact Oropharynx- clear Neck- supple, no JVP Lymph- no cervical lymphadenopathy Lungs- Clear to ausculation bilaterally, normal work of breathing Heart- Regular rate and rhythm, no murmurs, rubs or gallops, PMI not laterally displaced GI- soft, NT, ND, + BS Extremities- no clubbing, cyanosis, or edema MS- no significant deformity or atrophy Skin- no rash or lesion Psych- euthymic mood, full affect Neuro- strength and sensation are intact  EKG- NSR at 88 bpm, Pr int  176 ms, qrs int 80 ms, qtc 428 ms(stable) Epic records reviewed   Assessment and Plan: 1. Paroxysmal afib Maintaining SR on tikosyn Continue 250 mcg bid General precautions re tikosyn reviewed with pt Continue warfarin Bmet/mag today Pt states the he will be getting his  care at  [facility] and he will f/u there in 6 weeks and every 3 months thereafter   2. HTN Elevated initially but rechecked better after a few minutes of rest Being followed at the [facility]    afib clinic as needed F/u at the [place] as planned   [place] C. Gustavo Dispenza, [facility] [address] [place], [place] [address] [phone]

## 2016-10-12 ENCOUNTER — Ambulatory Visit (HOSPITAL_COMMUNITY)
Admission: RE | Admit: 2016-10-12 | Discharge: 2016-10-12 | Disposition: A | Discharge status: Home / Self Care | Payer: Medicare Other | Source: Ambulatory Visit | Attending: Nurse Practitioner | Admitting: Nurse Practitioner

## 2016-10-12 DIAGNOSIS — I481 Persistent atrial fibrillation: Secondary | ICD-10-CM | POA: Diagnosis not present

## 2016-10-12 DIAGNOSIS — I480 Paroxysmal atrial fibrillation: Secondary | ICD-10-CM | POA: Diagnosis not present

## 2016-10-12 LAB — MAGNESIUM: Magnesium: 1.8 mg/dL (ref 1.7–2.4)

## 2016-10-13 ENCOUNTER — Other Ambulatory Visit (HOSPITAL_COMMUNITY): Payer: Self-pay | Admitting: *Deleted

## 2016-10-13 DIAGNOSIS — I4819 Other persistent atrial fibrillation: Secondary | ICD-10-CM

## 2016-10-14 ENCOUNTER — Other Ambulatory Visit: Payer: Self-pay | Admitting: Cardiovascular Disease

## 2016-10-15 DIAGNOSIS — C44329 Squamous cell carcinoma of skin of other parts of face: Secondary | ICD-10-CM | POA: Diagnosis not present

## 2016-10-15 DIAGNOSIS — L578 Other skin changes due to chronic exposure to nonionizing radiation: Secondary | ICD-10-CM | POA: Diagnosis not present

## 2016-10-15 DIAGNOSIS — L821 Other seborrheic keratosis: Secondary | ICD-10-CM | POA: Diagnosis not present

## 2016-10-15 DIAGNOSIS — L570 Actinic keratosis: Secondary | ICD-10-CM | POA: Diagnosis not present

## 2016-10-21 ENCOUNTER — Telehealth: Payer: Self-pay

## 2016-10-21 NOTE — Telephone Encounter (Signed)
Please advise patient is taking dofetilide, he wants to know if he can take metamucil while on this

## 2016-10-21 NOTE — Telephone Encounter (Signed)
Yes, this should be fine.  thanks

## 2016-10-23 ENCOUNTER — Ambulatory Visit (INDEPENDENT_AMBULATORY_CARE_PROVIDER_SITE_OTHER): Payer: Medicare Other | Admitting: Family Medicine

## 2016-10-23 ENCOUNTER — Encounter: Payer: Self-pay | Admitting: Family Medicine

## 2016-10-23 VITALS — BP 124/66 | HR 54 | Temp 98.3°F | Resp 20 | Ht 74.0 in | Wt 226.5 lb

## 2016-10-23 DIAGNOSIS — I2510 Atherosclerotic heart disease of native coronary artery without angina pectoris: Secondary | ICD-10-CM | POA: Diagnosis not present

## 2016-10-23 DIAGNOSIS — J209 Acute bronchitis, unspecified: Secondary | ICD-10-CM | POA: Diagnosis not present

## 2016-10-23 MED ORDER — HYDROCODONE-HOMATROPINE 5-1.5 MG/5ML PO SYRP: 5.0000 mL | ORAL_SOLUTION | Freq: Four times a day (QID) | ORAL | 0 refills | Status: AC | PRN

## 2016-10-23 NOTE — Progress Notes (Signed)
Pre visit review using our clinic review tool, if applicable. No additional management support is needed unless otherwise documented below in the visit note. 

## 2016-10-23 NOTE — Progress Notes (Signed)
Subjective:     Patient ID: Thomas Marshall, male   DOB: Oct 20, 1941, 75 y.o.   MRN: [id number]  HPI Patient seen for acute visit with 2 week history of cough. He states his cough is actually slightly better than it was a few days ago. Still has significant nighttime cough. Has taken Robitussin-DM without improvement. Quit smoking way back in early 70s. No chronic lung disease. He does have history of atrial fibrillation and is on chronic anticoagulation with Coumadin. Wife has had similar symptoms. Minimal nasal congestion. No dyspnea. Denies any recent hemoptysis or any appetite or weight changes.  Past Medical History:  Diagnosis Date  . AAA (abdominal aortic aneurysm) ([place])   . Arthritis   . Cancer (HCC)    skin & squamous cell  . Carotid artery disease ([place])    a. s/p R CEA 12/2015.  [name] CKD (chronic kidney disease), stage III    a. per historical labs.  . Coronary artery disease    a. s/p CABG 2004.  . Diabetes mellitus without complication ([place])   . GERD (gastroesophageal reflux disease)   . Hyperlipidemia   . Hypertension   . Medication intolerance Multiple   . PAF (paroxysmal atrial fibrillation) ([place])   . Pneumonia   . Sinus bradycardia    a. baseline HR 50s-60s, also h/o bradycardia on metoprolol and carvedilol   Past Surgical History:  Procedure Laterality Date  . ANKLE SURGERY     right fused  . BACK SURGERY     low back   . CHOLECYSTECTOMY N/A 12/07/2013   Procedure: LAPAROSCOPIC CHOLECYSTECTOMY ;  Surgeon: [name], MD;  Location: [facility];  Service: General;  Laterality: N/A;  . COLONOSCOPY    . CORONARY ARTERY BYPASS GRAFT  2004   [place]  . ENDARTERECTOMY Right 12/10/2015   Procedure: Right Carotid ENDARTERECTOMY;  Surgeon: Conrad Arrington, MD;  Location: [facility];  Service: Vascular;  Laterality: Right;  . EXPLORATION Marshall OPERATIVE OPEN HEART    . FINGER SURGERY     skin graft on rt index  . FINGER SURGERY Right    right index finger.  [name] HERNIA REPAIR Right     RIH  . JOINT REPLACEMENT    . KNEE SURGERY     replacement on both knees  . PROSTATE SURGERY    . TONSILLECTOMY    . URINARY SURGERY     scar tissue    reports that he quit smoking about 45 years ago. He has a 13.00 pack-year smoking history. He has never used smokeless tobacco. He reports that he does not drink alcohol or use drugs. family history includes Cancer in his sister; Diabetes in his brother and sister; Heart disease in his brother, father, and mother; Hyperlipidemia in his brother and father; Hypertension in his brother, father, and sister; Stroke in his mother. Allergies  Allergen Reactions  . Lisinopril Swelling and Other (See Comments)    Angioedema.   . Losartan Swelling and Other (See Comments)    Angioedema   . Nifedipine Swelling    Sugar increase  . Triamterene Swelling  . Hydralazine Hcl Other (See Comments)    Fatigue; poor appetite  . Amoxicillin Rash  . Ampicillin Rash  . Carvedilol Other (See Comments)    Low heart rate      Review of Systems  Constitutional: Negative for activity change, appetite change, chills, fatigue, fever and unexpected weight change.  HENT: Negative for congestion, ear pain, sore throat and trouble  swallowing.   Respiratory: Positive for cough. Negative for shortness of breath, wheezing and stridor.   Cardiovascular: Negative for chest pain and leg swelling.  Gastrointestinal: Negative for abdominal pain.  Musculoskeletal: Negative for arthralgias.  Skin: Negative for rash.  Neurological: Negative for syncope and headaches.  Hematological: Negative for adenopathy.       Objective:   Physical Exam  Constitutional: He is oriented to person, place, and time. He appears well-developed and well-nourished. No distress.  HENT:  Head: Normocephalic and atraumatic.  Eyes: Pupils are equal, round, and reactive to light.  Neck: Normal range of motion. Neck supple. No thyromegaly present.  Cardiovascular: Normal rate.    Pulmonary/Chest: Effort normal. No stridor. No respiratory distress. He has no wheezes. He has no rales.  Abdominal: Soft. Bowel sounds are normal. There is no tenderness.  Musculoskeletal: He exhibits no edema.  Lymphadenopathy:    He has no cervical adenopathy.  Neurological: He is alert and oriented to person, place, and time.  Psychiatric: He has a normal mood and affect.       Assessment:     Cough. Suspect acute viral bronchitis. Nonfocal exam    Plan:     -Hycodan cough syrup 1 teaspoon daily at bedtime for severe cough -Follow-up promptly for any fever or increasing shortness of breath  Thomas Post MD [facility] at [facility]

## 2016-10-23 NOTE — Patient Instructions (Signed)

## 2016-10-26 ENCOUNTER — Ambulatory Visit (INDEPENDENT_AMBULATORY_CARE_PROVIDER_SITE_OTHER): Payer: Medicare Other | Admitting: Pharmacist

## 2016-10-26 ENCOUNTER — Other Ambulatory Visit: Payer: Medicare Other | Admitting: *Deleted

## 2016-10-26 DIAGNOSIS — I4891 Unspecified atrial fibrillation: Secondary | ICD-10-CM

## 2016-10-26 DIAGNOSIS — I2510 Atherosclerotic heart disease of native coronary artery without angina pectoris: Secondary | ICD-10-CM

## 2016-10-26 DIAGNOSIS — Z7901 Long term (current) use of anticoagulants: Secondary | ICD-10-CM | POA: Diagnosis not present

## 2016-10-26 DIAGNOSIS — I480 Paroxysmal atrial fibrillation: Secondary | ICD-10-CM | POA: Diagnosis not present

## 2016-10-26 DIAGNOSIS — I4819 Other persistent atrial fibrillation: Secondary | ICD-10-CM

## 2016-10-26 DIAGNOSIS — I481 Persistent atrial fibrillation: Secondary | ICD-10-CM | POA: Diagnosis not present

## 2016-10-26 LAB — MAGNESIUM: MAGNESIUM: 1.9 mg/dL (ref 1.5–2.5)

## 2016-10-26 LAB — POCT INR: INR: 2.5

## 2016-10-28 ENCOUNTER — Encounter: Payer: Self-pay | Admitting: Vascular Surgery

## 2016-10-28 ENCOUNTER — Other Ambulatory Visit: Payer: Self-pay | Admitting: Physician Assistant

## 2016-10-29 NOTE — Progress Notes (Addendum)
Established Carotid Patient  History of Present Illness  Thomas Marshall is a 75 y.o. (1941/10/11) male who presents with chief complaint: Follow-up.  Previous carotid studies demonstrated: RICA >39% stenosis, LICA 76-73% stenosis.  Pt underwent R CEA on 12/10/15.  His plaque was found to be necrotic with near occlusion of the lumen.  Patient has no history of TIA or stroke symptom.  The patient has never had amaurosis fugax or monocular blindness.  The patient has never had facial drooping or hemiplegia.  The patient has never had receptive or expressive aphasia.    The patient's PMH, PSH, SH, and FamHx are unchanged from 01/30/16 except for recent hospitalization for atrial fibrillation with rapid ventricular response. The patient denied having palpitations but was very fatigued. His wife called the cardiology office and he was sent to the ER. He was started on Tikosyn and converted to sinus rhythm after 3 doses. He is on Coumadin for anticoagulation.  He takes aspirin daily and a statin for hyperlipidemia. He is a former smoker.   Current Outpatient Prescriptions  Medication Sig Dispense Refill  . aspirin 81 MG tablet Take 81 mg by mouth daily.    [name] atorvastatin (LIPITOR) 40 MG tablet Take 40 mg by mouth daily.    . cholecalciferol (VITAMIN D) 1000 UNITS tablet Take 1,000 Units by mouth daily.    . cloNIDine (CATAPRES) 0.1 MG tablet TAKE ONE TABLET BY MOUTH TWICE DAILY 180 tablet 2  . dofetilide (TIKOSYN) 250 MCG capsule Take 1 capsule (250 mcg total) by mouth 2 (two) times daily. 60 capsule 6  . glucose blood test strip Use to test blood glucose level 2 times a day. 100 each 12  . magnesium oxide (MAG-OX) 400 MG tablet TAKE ONE TABLET BY MOUTH 4 TIMES DAILY 120 tablet 6  . metFORMIN (GLUCOPHAGE) 1000 MG tablet Take 1 tablet (1,000 mg total) by mouth 2 (two) times daily with a meal. 180 tablet 3  . nitroGLYCERIN (NITROSTAT) 0.4 MG SL tablet Place 1 tablet (0.4 mg total) under the tongue  every 5 (five) minutes as needed for chest pain (x 3 doses). 30 tablet 3  . omeprazole (PRILOSEC) 20 MG capsule Take 20 mg by mouth daily.    [name] terazosin (HYTRIN) 1 MG capsule Take 1 capsule (1 mg total) by mouth at bedtime. 30 capsule 11  . vitamin C (ASCORBIC ACID) 500 MG tablet Take 500 mg by mouth daily.    [name] warfarin (COUMADIN) 10 MG tablet Take as directed by Coumadin Clinic 100 tablet 1  . HYDROcodone-homatropine (HYCODAN) 5-1.5 MG/5ML syrup Take 5 mLs by mouth every 6 (six) hours as needed. (Patient not taking: Reported on 10/30/2016) 120 mL 0   No current facility-administered medications for this visit.     Allergies  Allergen Reactions  . Lisinopril Swelling and Other (See Comments)    Angioedema.   . Losartan Swelling and Other (See Comments)    Lips swell Angioedema   . Nifedipine Swelling    Sugar increase  . Triamterene Swelling  . Hydralazine Hcl Other (See Comments)    Fatigue; poor appetite  . Amoxicillin Rash  . Ampicillin Rash  . Carvedilol Other (See Comments)    Low heart rate     On ROS today: He denies any amaurosis fugax, sudden onset weakness or numbness in the extremities, expressive aphasia.   Physical Examination  Vitals:   10/30/16 1346 10/30/16 1349 10/30/16 1352  BP: (!) 155/68 (!) 156/77 (!) 148/70  Pulse: 62 61 61  Resp: 16    Temp: 97.7 F (36.5 C)    SpO2: 97%    Weight: 223 lb (101.2 kg)    Height: 6\' 2"  (1.88 m)      Body mass index is 28.63 kg/m.  General: Alert, O x 3, WD,NAD  Neck: Supple, mid-line trachea,    Pulmonary: Sym exp, good B air movt,CTA B  Cardiac: RRR, Nl S1, S2, no Murmurs, No rubs, No S3,S4  Vascular: Vessel Right Left  Radial Palpable Palpable  Carotid Palpable, No Bruit Palpable, No Bruit    Musculoskeletal: M/S 5/5 throughout  , Extremities without ischemic changes   Neurologic: CN 2-12 intact , Pain and light touch intact in extremities , Motor exam as listed above   Non-Invasive Vascular  Imaging  CAROTID DUPLEX (Date: 10/30/2016):   R ICA stenosis: Patent right carotid endarterectomy site  R VA:  patent and antegrade  L ICA stenosis: 40-59%  L VA:  patent and antegrade   Medical Decision Making  Thomas Marshall is a 75 y.o. male who presents with: s/p R CEA for asx R ICA stenosis >80%., asx L ICA stenosis 40-59%. 3 cm AAA.   Based on the patient's vascular studies and examination, I have offered the patient: Follow-up with carotid duplex in 1 year. AAA duplex in 1 year.  I discussed in depth with the patient the nature of atherosclerosis, and emphasized the importance of maximal medical management including strict control of blood pressure, blood glucose, and lipid levels, antiplatelet agents, obtaining regular exercise, and cessation of smoking.    The patient is aware that without maximal medical management the underlying atherosclerotic disease process will progress, limiting the benefit of any interventions. The patient is currently on a statin:  Lipitor. The patient is currently on an anti-platelet: ASA. He is on Coumadin for atrial fibrillation. The patient was advised to seek emergency assistance if he developed sudden severe abdominal or back pain.  Thank you for allowing [place] to participate in this patient's care.   Adele Barthel, MD, FACS [facility] Office: [phone] Pager: [phone]   VASCULAR QUALITY INITIATIVE FOLLOW UP DATA:  Current smoker: [  ] yes  [ x ] no  Living status: [  x]  Home  [  ] Nursing home  [  ] Homeless    MEDS:  ASA [x  ] yes  [  ] no- [  ] medical reason  [  ] non compliant  STATIN  [  x] yes  [  ] no- [  ] medical reason  [  ] non compliant  Beta blocker [  ] yes  [  x] no- [  ] medical reason  [  ] non compliant  ACE inhibitor [  ] yes  [  x] no- [  ] medical reason  [  ] non compliant  P2Y12 Antagonist [  x] none  [  ] clopidogrel-Plavix  [  ] ticlopidine-Ticlid   [  ]  prasugrel-Effient  [  ] ticagrelor- Brilinta    Anticoagulant [  ] None  [ x] warfarin  [  ] rivaroxaban-Xarelto [  ] dabigatran- Pradaxa  Neurologic event since D/C:  [ x ] no  [  ] yes: [  ] eye event  [  ] cortical event  [  ] VB event  [  ] non specific event  [  ] right  [  ]  left  [  ] TIA  [  ] stroke  Date:   Modified Rankin Score: 0  MI since D/C: [ x] no  [  ] troponin only  [  ] EKG or clinical  Cranial nerve injury: [ x ] none  [  ] resolved  [  ] persistent  Duplex CEA site: [  ] no  [x  ] yes - PSV= 104  EDV= 26  ICA/CCA ratio: 0.86  Stenosis= [ x ] <40% [  ] 40-59% [  ] 60-79%  [  ] > 80%  [  ]  Occluded  CEA site re-operation:  [  ] no   [  ] yes- date of re-op:  CEA site PCI:   [  ] no   [  ] yes- date of PCI:

## 2016-10-30 ENCOUNTER — Encounter: Payer: Self-pay | Admitting: Vascular Surgery

## 2016-10-30 ENCOUNTER — Ambulatory Visit (HOSPITAL_COMMUNITY)
Admission: RE | Admit: 2016-10-30 | Discharge: 2016-10-30 | Disposition: A | Discharge status: Home / Self Care | Payer: Medicare Other | Source: Ambulatory Visit | Attending: Vascular Surgery | Admitting: Vascular Surgery

## 2016-10-30 ENCOUNTER — Ambulatory Visit (INDEPENDENT_AMBULATORY_CARE_PROVIDER_SITE_OTHER): Payer: Medicare Other | Admitting: Vascular Surgery

## 2016-10-30 VITALS — BP 148/70 | HR 61 | Temp 97.7°F | Resp 16 | Ht 74.0 in | Wt 223.0 lb

## 2016-10-30 DIAGNOSIS — I6522 Occlusion and stenosis of left carotid artery: Secondary | ICD-10-CM | POA: Diagnosis not present

## 2016-10-30 DIAGNOSIS — I714 Abdominal aortic aneurysm, without rupture, unspecified: Secondary | ICD-10-CM

## 2016-10-30 DIAGNOSIS — I779 Disorder of arteries and arterioles, unspecified: Secondary | ICD-10-CM | POA: Diagnosis not present

## 2016-10-30 DIAGNOSIS — I739 Peripheral vascular disease, unspecified: Secondary | ICD-10-CM

## 2016-10-30 DIAGNOSIS — I6523 Occlusion and stenosis of bilateral carotid arteries: Secondary | ICD-10-CM

## 2016-10-30 LAB — VAS US CAROTID
LCCADDIAS: 18 cm/s
LCCAPSYS: 121 cm/s
LEFT ECA DIAS: -17 cm/s
LEFT VERTEBRAL DIAS: 11 cm/s
LICADSYS: -97 cm/s
Left CCA dist sys: 87 cm/s
Left CCA prox dias: 25 cm/s
Left ICA dist dias: -32 cm/s
Left ICA prox dias: -56 cm/s
Left ICA prox sys: -200 cm/s
RCCADSYS: -97 cm/s
RCCAPSYS: 103 cm/s
RIGHT CCA MID DIAS: -20 cm/s
RIGHT ECA DIAS: -9 cm/s
Right CCA prox dias: 12 cm/s

## 2016-11-03 ENCOUNTER — Telehealth: Payer: Self-pay | Admitting: Cardiovascular Disease

## 2016-11-03 NOTE — Telephone Encounter (Signed)
Thomas Marshall is calling because when he was in the hospital he said they stopped his blood pressure medication(Spironolactone )  and he didn't know it until he went to get it refilled . His blood pressure has been elevated and he wants to know shouldn't he be on that medication . Please call

## 2016-11-03 NOTE — Telephone Encounter (Signed)
Pt called to let us know his BP was 143/66 w/ HR of 62.

## 2016-11-03 NOTE — Telephone Encounter (Signed)
I spoke with patient in regards to his BP. He reports spironolactone was stopped at 09/30/16 discharge when he was started on Tikosyn.  He states he is concerned now, BPs 150-175/60-80. HR "ok".  He admits to not keeping BP/HR record.  He is asking if he should be re-started on spironolactone. He states he is going out of town soon and needs to know what to do. Please advise.

## 2016-11-06 ENCOUNTER — Ambulatory Visit: Payer: 59 | Admitting: Vascular Surgery

## 2016-11-06 ENCOUNTER — Encounter (HOSPITAL_COMMUNITY): Payer: 59

## 2016-11-13 ENCOUNTER — Ambulatory Visit: Payer: 59 | Admitting: Vascular Surgery

## 2016-11-13 ENCOUNTER — Encounter (HOSPITAL_COMMUNITY): Payer: 59

## 2016-11-17 ENCOUNTER — Encounter: Payer: Self-pay | Admitting: Podiatry

## 2016-11-17 ENCOUNTER — Ambulatory Visit (INDEPENDENT_AMBULATORY_CARE_PROVIDER_SITE_OTHER): Payer: Medicare Other | Admitting: Podiatry

## 2016-11-17 DIAGNOSIS — M79674 Pain in right toe(s): Secondary | ICD-10-CM | POA: Diagnosis not present

## 2016-11-17 DIAGNOSIS — M79675 Pain in left toe(s): Secondary | ICD-10-CM | POA: Diagnosis not present

## 2016-11-17 DIAGNOSIS — E1140 Type 2 diabetes mellitus with diabetic neuropathy, unspecified: Secondary | ICD-10-CM | POA: Diagnosis not present

## 2016-11-17 DIAGNOSIS — B351 Tinea unguium: Secondary | ICD-10-CM

## 2016-11-17 DIAGNOSIS — Q828 Other specified congenital malformations of skin: Secondary | ICD-10-CM | POA: Diagnosis not present

## 2016-11-17 DIAGNOSIS — L84 Corns and callosities: Secondary | ICD-10-CM

## 2016-11-17 NOTE — Progress Notes (Signed)
Patient ID: Thomas Marshall, male   DOB: 10/29/1941, 75 y.o.   MRN: [id number]  Subjective: 75 y.o. returns the office today for painful, elongated, thickened toenails which he cannot trim himself. He also has a painful callus on the left foot that needs to be trimmed. Denies any redness or drainage around the nails/callus. He states the callus on the outside of the right foot has been "touchy" recently. No redness or swelling. Denies any acute changes since last appointment and no new complaints today. Denies any systemic complaints such as fevers, chills, nausea, vomiting.   Objective: AAO 3, NAD DP/PT pulses palpable, CRT less than 3 seconds Protective sensation decreased with Simms Weinstein monofilament Nails hypertrophic, dystrophic, elongated, brittle, discolored 10. There is tenderness overlying the nails 1-5 bilaterally. There is no surrounding erythema or drainage along the nail sites. Hyperkeratotic lesion right fifth metatarsal base. Upon debridement there is a superficial abrasion type ulceration. There is no surrounding erythema, ascending cellulitis. No clinical signs of infection. There is prominence of the 5th metatarsal base. Also smaller lesion submet 5 on the right foot. No underlying ulcer, drinage, or signs of infection. No open lesions or pre-ulcerative lesions are identified. No other areas of tenderness bilateral lower extremities. No overlying edema, erythema, increased warmth. No pain with calf compression, swelling, warmth, erythema.  Assessment: Patient presents with symptomatic onychomycosis; hyperkeratotic lesion x 2 with underlying ulceration to the 5th metatarsal base.   Plan: -Treatment options including alternatives, risks, complications were discussed -Nails sharply debrided 10 without complication/bleeding. -Hyperkeratotic lesion debrided 1 without complications or bleeding to submet 5 -On the fifth metatarsal base is a superficial wound. Continue antibiotic  ointment and a dressing changes daily. Monitor for infection. If not healed in 2 weeks to call the office for follow-up. Monitor for infection.  -Discussed daily foot inspection. If there are any changes, to call the office immediately.  -Follow-up in 3 months or sooner if any problems are to arise. In the meantime, encouraged to call the office with any questions, concerns, changes symptoms.  Celesta Gentile, DPM

## 2016-11-19 ENCOUNTER — Encounter: Payer: Self-pay | Admitting: Nurse Practitioner

## 2016-11-19 MED ORDER — MAGNESIUM OXIDE 400 MG PO TABS: 1.0000 | ORAL_TABLET | Freq: Four times a day (QID) | ORAL | 3 refills | Status: DC

## 2016-11-19 MED ORDER — AMLODIPINE BESYLATE 2.5 MG PO TABS: 2.5000 mg | ORAL_TABLET | Freq: Every day | ORAL | 3 refills | Status: DC

## 2016-11-19 NOTE — Telephone Encounter (Signed)
Spoke with patient in the office today - he's here with his wife. BP running too high. Multiple antihypertensive allergies and intolerances. After review, recommend amlodipine 2.5 mg daily. Also requests 3 month Rx for magnesium (started when he began Tikosyn) with letter to [place] explaining why he needs it.

## 2016-11-19 NOTE — Telephone Encounter (Signed)
Patient came into office with his wife today for her office visit with Dr. Burt Knack.  Dr. Burt Knack advised patient to start amlodipine 2.5 mg once daily.  Patient also requests Rx of mag oxide to take to the [place] and a letter of medical necessity so that he can get this medication covered through them. Patient was given printed Rx for magnesium and for amlodipine and letter to take with him.  He thanked [place] for our help.

## 2016-11-27 ENCOUNTER — Other Ambulatory Visit: Payer: Self-pay | Admitting: Cardiovascular Disease

## 2016-11-27 NOTE — Telephone Encounter (Signed)
Pt was given rRX with 2 meds on it-Amlodipine 2.5 mg and Magnesium Oxide 400 mg he took it to a  local pharmacy and got the Amlopidine filled for 30 days to make sure he could take it, now he wants both RX's written so he can picked it up and take it to the [place] to fill

## 2016-12-01 NOTE — Telephone Encounter (Signed)
Pt walked into clinic today. Pt stated the BP med (Amlodipine 2.5 mg) is not working to lower his BP. Pt stated his systolic BP Sunday (99/87) was 149 and yesterday (1/1) 160s. Pt stated he needs paper Rx to send to the [place]. [facility] would not give back the Rx for him to send. Informed I would forward information to Dr. Burt Knack to advise. Pt stated he is available to stop back by tomorrow.

## 2016-12-03 MED ORDER — MAGNESIUM OXIDE 400 MG PO TABS: 1.0000 | ORAL_TABLET | Freq: Four times a day (QID) | ORAL | 3 refills | Status: DC

## 2016-12-03 MED ORDER — AMLODIPINE BESYLATE 5 MG PO TABS: 5.0000 mg | ORAL_TABLET | Freq: Every day | ORAL | 3 refills | Status: DC

## 2016-12-03 NOTE — Telephone Encounter (Signed)
I spoke with the pt and the Amlodipine 2.5mg  daily has not brought his BP readings down and wonders if this medication dosage should be adjusted. The previous hand written prescriptions were given to [facility] and the pt would need new prescriptions for Amlodipine and Magnesium Oxide (90 day supply) to take to the [place]. The pt would also require another letter of medical necessity to take to the [place].  I made the pt aware that I will follow-up with him tomorrow in regards to needed paperwork. Pt agreed with plan.

## 2016-12-03 NOTE — Telephone Encounter (Signed)
Would increase amlodipine to 5 mg daily

## 2016-12-03 NOTE — Telephone Encounter (Signed)
Emmaline Life, RN      9:18 AM  Note    Patient came into office with his wife today for her office visit with Dr. Burt Knack.  Dr. Burt Knack advised patient to start amlodipine 2.5 mg once daily.  Patient also requests Rx of mag oxide to take to the [place] and a letter of medical necessity so that he can get this medication covered through them. Patient was given printed Rx for magnesium and for amlodipine and letter to take with him.  He thanked [place] for our help.     [name], MD  to Emmaline Life, RN     9:01 AM  Note    Spoke with patient in the office today - he's here with his wife. BP running too high. Multiple antihypertensive allergies and intolerances. After review, recommend amlodipine 2.5 mg daily. Also requests 3 month Rx for magnesium (started when he began Tikosyn) with letter to [facility] explaining why he needs it.     November 03, 2016  Dorothyann Gibbs, LPN      35:32 PM  Note    Pt called to let us know his BP was 143/66 w/ HR of 62.

## 2016-12-03 NOTE — Telephone Encounter (Signed)
New prescriptions printed with updated doses. Will have Dr Burt Knack sign tomorrow and complete letter of medical necessity.

## 2016-12-04 NOTE — Telephone Encounter (Signed)
I spoke with the pt and made him aware of recommendation in regards to increasing Amlodipine dosage to 5mg  daily for better BP control.  Prescriptions and letter signed and placed at the front desk for the pt to pick-up at his convenience.

## 2016-12-10 ENCOUNTER — Telehealth: Payer: Self-pay | Admitting: Cardiovascular Disease

## 2016-12-10 ENCOUNTER — Ambulatory Visit (INDEPENDENT_AMBULATORY_CARE_PROVIDER_SITE_OTHER): Payer: Medicare Other | Admitting: *Deleted

## 2016-12-10 DIAGNOSIS — I4891 Unspecified atrial fibrillation: Secondary | ICD-10-CM | POA: Diagnosis not present

## 2016-12-10 DIAGNOSIS — I480 Paroxysmal atrial fibrillation: Secondary | ICD-10-CM | POA: Diagnosis not present

## 2016-12-10 DIAGNOSIS — Z7901 Long term (current) use of anticoagulants: Secondary | ICD-10-CM

## 2016-12-10 LAB — POCT INR: INR: 1.9

## 2016-12-10 NOTE — Telephone Encounter (Signed)
Walk in pt Form -pt has questions-form taken around.

## 2016-12-14 ENCOUNTER — Telehealth: Payer: Self-pay | Admitting: Cardiovascular Disease

## 2016-12-14 NOTE — Telephone Encounter (Signed)
LMTCB

## 2016-12-14 NOTE — Telephone Encounter (Signed)
New message     Pt c/o medication issue:  1. Name of Medication: amlodipine 2. How are you currently taking this medication (dosage and times per day)? 5mg  daily  3. Are you having a reaction (difficulty breathing--STAT)? no 4. What is your medication issue?  Medication not working

## 2016-12-14 NOTE — Telephone Encounter (Signed)
Pt states  amlodipine increased to 5 mg daily  4-5 days ago.   Pt states BP readings in the last week have been 155-147/65-77, pulse in the 51-55  since increasing amlodipine to 5mg  daily.  Pt denies any symptoms.  Pt advised to continue amlodipine 5mg  daily, may need more time for BP to respond to higher dose of amlodipine, continue  to take and record BP and pulse, call back in 7-10 days with BP readings.  Pt advised I will forward to Dr Burt Knack for review.

## 2016-12-15 NOTE — Telephone Encounter (Signed)
I agree with plan as outlined.

## 2016-12-22 ENCOUNTER — Ambulatory Visit (INDEPENDENT_AMBULATORY_CARE_PROVIDER_SITE_OTHER): Payer: Medicare Other | Admitting: *Deleted

## 2016-12-22 DIAGNOSIS — I4891 Unspecified atrial fibrillation: Secondary | ICD-10-CM

## 2016-12-22 DIAGNOSIS — I480 Paroxysmal atrial fibrillation: Secondary | ICD-10-CM | POA: Diagnosis not present

## 2016-12-22 DIAGNOSIS — Z7901 Long term (current) use of anticoagulants: Secondary | ICD-10-CM

## 2016-12-22 LAB — POCT INR: INR: 2.1

## 2016-12-29 ENCOUNTER — Telehealth: Payer: Self-pay | Admitting: Cardiovascular Disease

## 2016-12-29 NOTE — Telephone Encounter (Signed)
Pt had issue with high BP, was told to double Amlodipine for 15 days, he has done this and BP still high-150/65 this am

## 2016-12-29 NOTE — Telephone Encounter (Signed)
I spoke with the pt and he has been taking Amlodipine 5mg  daily for 15 days and his BP has not improved.  It remains 150-160 and the pt has taken all of his medication. The pt would like to hold off on picking up another prescription until Dr Burt Knack can review his list of medications and make further recommendations. I advised the pt that I will forward this note to Dr Burt Knack for review.  I did look to see if pt has had renal arteries checked and there is a CT in the system from September 2017 but no specific comment in regards to renal arteries.

## 2016-12-31 NOTE — Telephone Encounter (Signed)
meds and hx reviewed. Limited options. Intolerant of ACE/ARB/thiazides/aldactone/hydralazine. Marked bradycardia in past with beta-blockers. Would increase clonidine to 0.2 mg BID. Continue amlodipine.

## 2017-01-01 MED ORDER — AMLODIPINE BESYLATE 5 MG PO TABS
5.0000 mg | ORAL_TABLET | Freq: Every day | ORAL | 1 refills | Status: DC
Start: 2017-01-01 — End: 2017-01-07

## 2017-01-01 NOTE — Telephone Encounter (Signed)
I spoke with the pt and made him aware that Dr Burt Knack would like him to continue amlodipine (Rx sent to pharmacy).  I spoke with him about increasing Clonidine to 0.2mg  bid but the pt states that he has tried this in the past and it made him sluggish.  The pt does not want to make this change.  I advised him that it may be beneficial for him to be seen in the HTN clinic and the pt is in agreement to make an appointment.

## 2017-01-04 NOTE — Telephone Encounter (Signed)
HTN clinic appointment scheduled 01/07/17.

## 2017-01-06 NOTE — Progress Notes (Signed)
Patient ID: SHYQUAN STALLBAUMER                 DOB: July 11, 1941                      MRN: [id number]     HPI: Thomas Marshall is a 76 y.o. male referred by Dr. Burt Knack to HTN clinic. PMH is significant for CAD s/p CABG in 2004, PAF, DM2, and HTN with multiple medication intolerances. Pt called with reports of high BP readings at home and given multiple med intolerances, presents to HTN clinic for further management. Of note, he does not have medication insurance which needs to be taken into consideration as well.  Pt reports home systolic readings have ranged 150-160s. Has not noticed any improvement in readings since increasing amlodipine to 5mg  daily. Pt does not report any dizziness with terazosin. He has had some lower lip swelling for the past 2 days but no issues with breathing, no numbness or hives. No medication changes in that time frame, no new foods, lotions etc.   SCr 1.75 - CrCl ~22mL/min.  Current HTN meds: amlodipine 5mg  daily, terazosin 1mg  daily, clonidine 0.1mg  BID Previously tried: lisinopril and losartan - angioedema, triamterene - swelling, carvedilol - bradycardia, hydralazine - fatigue and poor appetite, thiazide diuretics - can't use since pt on Tikosyn, higher doses of clonidine - fatigue, spironolactone - swelling in right breast BP goal: <140/39mmHg  Family History: Mother with stroke and heart disease, father with HTN, HLD, and heart disease, sister with DM, HTN, and cancer, brother with DM, HTN, HLD, and heart disease.  Social History: Former smoker 1 PPD for 13 years, quit in 1972. Denies alcohol and tobacco use.  Diet: Cereal or oatmeal for breakfast. Doesn't add extra salt to food but does eat out at [employer] frequently.  Exercise: Will walk more when it's warmer out.  Home BP readings: 381-017P systolic  Wt Readings from Last 3 Encounters:  10/30/16 223 lb (101.2 kg)  10/23/16 226 lb 8 oz (102.7 kg)  10/07/16 229 lb 6.4 oz (104.1 kg)   BP Readings from  Last 3 Encounters:  10/30/16 (!) 148/70  10/23/16 124/66  10/07/16 (!) 172/74   Pulse Readings from Last 3 Encounters:  10/30/16 61  10/23/16 (!) 54  10/07/16 88    Renal function: CrCl cannot be calculated (Patient's most recent lab result is older than the maximum 21 days allowed.).  Past Medical History:  Diagnosis Date  . AAA (abdominal aortic aneurysm) ([facility])   . Arthritis   . Cancer (HCC)    skin & squamous cell  . Carotid artery disease ([facility])    a. s/p R CEA 12/2015.  [name] CKD (chronic kidney disease), stage III    a. per historical labs.  . Coronary artery disease    a. s/p CABG 2004.  . Diabetes mellitus without complication ([facility])   . GERD (gastroesophageal reflux disease)   . Hyperlipidemia   . Hypertension   . Medication intolerance Multiple   . PAF (paroxysmal atrial fibrillation) ([facility])   . Pneumonia   . Sinus bradycardia    a. baseline HR 50s-60s, also h/o bradycardia on metoprolol and carvedilol    Current Outpatient Prescriptions on File Prior to Visit  Medication Sig Dispense Refill  . amLODipine (NORVASC) 5 MG tablet Take 1 tablet (5 mg total) by mouth daily. 30 tablet 1  . apixaban (ELIQUIS) 5 MG TABS tablet Take 5 mg by  mouth 2 (two) times daily.    [name] aspirin 81 MG tablet Take 81 mg by mouth daily.    [name] atorvastatin (LIPITOR) 40 MG tablet Take 40 mg by mouth daily.    . cholecalciferol (VITAMIN D) 1000 UNITS tablet Take 1,000 Units by mouth daily.    . cloNIDine (CATAPRES) 0.1 MG tablet TAKE ONE TABLET BY MOUTH TWICE DAILY 180 tablet 2  . dofetilide (TIKOSYN) 250 MCG capsule Take 1 capsule (250 mcg total) by mouth 2 (two) times daily. 60 capsule 6  . glucose blood test strip Use to test blood glucose level 2 times a day. 100 each 12  . magnesium oxide (MAG-OX) 400 MG tablet Take 1 tablet (400 mg total) by mouth 4 (four) times daily. 360 tablet 3  . metFORMIN (GLUCOPHAGE) 1000 MG tablet Take 1 tablet (1,000 mg total) by mouth 2 (two) times daily with a  meal. 180 tablet 3  . nitroGLYCERIN (NITROSTAT) 0.4 MG SL tablet Place 1 tablet (0.4 mg total) under the tongue every 5 (five) minutes as needed for chest pain (x 3 doses). 30 tablet 3  . omeprazole (PRILOSEC) 20 MG capsule Take 20 mg by mouth daily.    [name] terazosin (HYTRIN) 1 MG capsule Take 1 capsule (1 mg total) by mouth at bedtime. 30 capsule 11  . vitamin C (ASCORBIC ACID) 500 MG tablet Take 500 mg by mouth daily.     No current facility-administered medications on file prior to visit.     Allergies  Allergen Reactions  . Lisinopril Swelling and Other (See Comments)    Angioedema.   . Losartan Swelling and Other (See Comments)    Lips swell Angioedema   . Nifedipine Swelling    Sugar increase  . Triamterene Swelling  . Hydralazine Hcl Other (See Comments)    Fatigue; poor appetite  . Amoxicillin Rash  . Ampicillin Rash  . Carvedilol Other (See Comments)    Low heart rate      Assessment/Plan:  1. Hypertension - BP 158/62 above goal <140/35mmHg. Pt has an extensive list of HTN medication intolerances including ACEi/ARB, thiazides, spironolactone, hydralazine, and higher doses of clonidine. He also does not have medication insurance. Both of these make medication options limited. Pt is willing to increase amlodipine to 10mg  daily and increase terazosin to 2mg  at night. Pt will work on cutting back on fast food. Will follow up in 2 weeks when pt has his Eliquis 1 month follow up. Advised pt to bring his home BP cuff to next visit.   Megan E. Supple, PharmD, CPP, [facility] [address], [place], [place] [address] Phone: [phone]; Fax: [phone] 01/07/2017 11:44 AM

## 2017-01-07 ENCOUNTER — Ambulatory Visit (INDEPENDENT_AMBULATORY_CARE_PROVIDER_SITE_OTHER): Payer: Medicare Other | Admitting: Pharmacist

## 2017-01-07 DIAGNOSIS — I10 Essential (primary) hypertension: Secondary | ICD-10-CM

## 2017-01-07 NOTE — Patient Instructions (Addendum)
Increase amlodipine to 10mg  daily. You can take 2 of your 5mg  tablets.  Increase terazosin to 2mg  daily at bedtime. You can take 2 of your 1mg  tablets.  Cut back on fast food ([employer] and [employer]).  Recheck blood pressure on 2/20 same day as Eliquis follow up.  Bring in your home blood pressure cuff to next visit on 2/20 at 9am.

## 2017-01-19 ENCOUNTER — Ambulatory Visit (INDEPENDENT_AMBULATORY_CARE_PROVIDER_SITE_OTHER): Payer: Medicare Other | Admitting: Pharmacist

## 2017-01-19 ENCOUNTER — Telehealth: Payer: Self-pay | Admitting: *Deleted

## 2017-01-19 VITALS — BP 128/62 | HR 64

## 2017-01-19 DIAGNOSIS — I4891 Unspecified atrial fibrillation: Secondary | ICD-10-CM | POA: Diagnosis not present

## 2017-01-19 DIAGNOSIS — I10 Essential (primary) hypertension: Secondary | ICD-10-CM | POA: Diagnosis not present

## 2017-01-19 DIAGNOSIS — I480 Paroxysmal atrial fibrillation: Secondary | ICD-10-CM | POA: Diagnosis not present

## 2017-01-19 LAB — BASIC METABOLIC PANEL
BUN / CREAT RATIO: 16 (ref 10–24)
BUN: 23 mg/dL (ref 8–27)
CHLORIDE: 100 mmol/L (ref 96–106)
CO2: 26 mmol/L (ref 18–29)
Calcium: 9 mg/dL (ref 8.6–10.2)
Creatinine, Ser: 1.41 mg/dL — ABNORMAL HIGH (ref 0.76–1.27)
GFR calc Af Amer: 55 — ABNORMAL LOW (ref >59)
GFR calc non Af Amer: 48 — ABNORMAL LOW (ref >59)
GLUCOSE: 145 mg/dL — AB (ref 65–99)
Potassium: 4.5 mmol/L (ref 3.5–5.2)
SODIUM: 141 mmol/L (ref 134–144)

## 2017-01-19 LAB — CBC
Hematocrit: 36 % — ABNORMAL LOW (ref 37.5–51.0)
Hemoglobin: 11.8 g/dL — ABNORMAL LOW (ref 13.0–17.7)
MCH: 30.3 pg (ref 26.6–33.0)
MCHC: 32.8 g/dL (ref 31.5–35.7)
MCV: 92 fL (ref 79–97)
PLATELETS: 143 10*3/uL — AB (ref 150–379)
RBC: 3.9 x10E6/uL — AB (ref 4.14–5.80)
RDW: 14.8 % (ref 12.3–15.4)
WBC: 4.9 10*3/uL (ref 3.4–10.8)

## 2017-01-19 MED ORDER — AMLODIPINE BESYLATE 10 MG PO TABS
10.0000 mg | ORAL_TABLET | Freq: Every day | ORAL | 11 refills | Status: DC
Start: 2017-01-19 — End: 2020-02-21

## 2017-01-19 NOTE — Progress Notes (Signed)
Pt was started on Eliquis 5mg  BID for afib on December 22, 2016.  Reviewed patients medication list.  Pt is not currently on any combined P-gp and strong CYP3A4 inhibitors/inducers (ketoconazole, traconazole, ritonavir, carbamazepine, phenytoin, rifampin, St. John's wort).  Reviewed labs.  SCr 1.41, Weight 101.11kg, age 76 years old.  Dose appropriate based on age, weight, and SCr. Hgb and HCT both stable.  A full discussion of the nature of anticoagulants has been carried out.  A benefit/risk analysis has been presented to the patient, so that they understand the justification for choosing anticoagulation with Eliquis at this time.  The need for compliance is stressed.  Pt is aware to take the medication twice daily.  Side effects of potential bleeding are discussed, including unusual colored urine or stools, coughing up blood or coffee ground emesis, nose bleeds or serious fall or head trauma.  Discussed signs and symptoms of stroke. The patient should avoid any OTC items containing aspirin or ibuprofen.  Avoid alcohol consumption.   Call if any signs of abnormal bleeding.  Discussed financial obligations and resolved any difficulty in obtaining medication.  Next lab test in 6 months. Pt is aware of his results.

## 2017-01-19 NOTE — Patient Instructions (Addendum)
Decrease terazosin back to 1mg  at night - this should help improve your dizziness.   Continue taking amlodipine 10mg  daily - refill has been sent to your pharmacy.   Continue taking clonidine 0.1mg  twice a day.  Call Imani Sherrin in blood pressure clinic with any problems [phone].

## 2017-01-19 NOTE — Progress Notes (Signed)
Patient ID: ANTHONI Marshall                 DOB: Oct 13, 1941                      MRN: [id number]     HPI: Thomas Marshall is a 76 y.o. male referred by Dr. Burt Knack to HTN clinic. PMH is significant for CAD s/p CABG in 2004, PAF, DM2, and HTN.  HTN management is limited due to lack of medication insurance and multiple medication intolerances (ACEi, ARB, triamterene, hydralazine, beta blockers, thiazides, clonidine, and spironolactone). At last visit, pt was advised to increase amlodipine to 10mg  daily and increase terazosin to 2mg  at night.   Pt reports having 1 dizzy spell that lasted for 30-40 minutes since his last visit. He laid back down to go to sleep after 40 minutes and felt ok once he woke up. Dizzy spells are not common for him. He checked his BP on his brother's machine once and the systolic reading was in the 130s. He is planning to get a new cuff from the [facility] (currentlly has [facility] one).  Home readings: 131/70 - 152/75, mainly 130-140s/60-70s. HR 46 - 64. Felt sleepy the one time his HR was 46. Usually HR 50-60s.  Home BP cuff in clinic: 157/75, HR 52. On recheck 5 mins later, BP improved to 137/70. No caffeine this AM and took his meds already.  SCr 1.75 - CrCl ~72mL/min.  Current HTN meds: amlodipine 10mg  daily, terazosin 2mg  daily, clonidine 0.1mg  BID Previously tried: lisinopril and losartan - angioedema, triamterene - swelling, carvedilol - bradycardia, hydralazine - fatigue and poor appetite, thiazide diuretics - can't use since pt on Tikosyn, higher doses of clonidine - fatigue, spironolactone - swelling in right breast BP goal: <140/49mmHg  Family History: Mother with stroke and heart disease, father with HTN, HLD, and heart disease, sister with DM, HTN, and cancer, brother with DM, HTN, HLD, and heart disease.  Social History: Former smoker 1 PPD for 13 years, quit in 1972. Denies alcohol and tobacco use.  Diet: Cereal or oatmeal for breakfast. Doesn't add extra salt to food  but does eat out at [employer] frequently.  Exercise: Will walk more when it's warmer out.  Home BP readings: Improved to 850-277A systolic. 1 episode of dizziness.    Wt Readings from Last 3 Encounters:  10/30/16 223 lb (101.2 kg)  10/23/16 226 lb 8 oz (102.7 kg)  10/07/16 229 lb 6.4 oz (104.1 kg)   BP Readings from Last 3 Encounters:  01/07/17 (!) 158/62  10/30/16 (!) 148/70  10/23/16 124/66   Pulse Readings from Last 3 Encounters:  01/07/17 (!) 58  10/30/16 61  10/23/16 (!) 54    Renal function: CrCl cannot be calculated (Patient's most recent lab result is older than the maximum 21 days allowed.).  Past Medical History:  Diagnosis Date  . AAA (abdominal aortic aneurysm) ([facility])   . Arthritis   . Cancer (HCC)    skin & squamous cell  . Carotid artery disease ([facility])    a. s/p R CEA 12/2015.  [name] CKD (chronic kidney disease), stage III    a. per historical labs.  . Coronary artery disease    a. s/p CABG 2004.  . Diabetes mellitus without complication ([facility])   . GERD (gastroesophageal reflux disease)   . Hyperlipidemia   . Hypertension   . Medication intolerance Multiple   . PAF (paroxysmal atrial fibrillation) (  HCC)   . Pneumonia   . Sinus bradycardia    a. baseline HR 50s-60s, also h/o bradycardia on metoprolol and carvedilol    Current Outpatient Prescriptions on File Prior to Visit  Medication Sig Dispense Refill  . amLODipine (NORVASC) 5 MG tablet Take 2 tablets (10 mg total) by mouth daily. 30 tablet 1  . apixaban (ELIQUIS) 5 MG TABS tablet Take 5 mg by mouth 2 (two) times daily.    [name] aspirin 81 MG tablet Take 81 mg by mouth daily.    [name] atorvastatin (LIPITOR) 40 MG tablet Take 40 mg by mouth daily.    . cholecalciferol (VITAMIN D) 1000 UNITS tablet Take 1,000 Units by mouth daily.    . cloNIDine (CATAPRES) 0.1 MG tablet TAKE ONE TABLET BY MOUTH TWICE DAILY 180 tablet 2  . dofetilide (TIKOSYN) 250 MCG capsule Take 1 capsule (250 mcg total) by  mouth 2 (two) times daily. 60 capsule 6  . glucose blood test strip Use to test blood glucose level 2 times a day. 100 each 12  . magnesium oxide (MAG-OX) 400 MG tablet Take 1 tablet (400 mg total) by mouth 4 (four) times daily. 360 tablet 3  . metFORMIN (GLUCOPHAGE) 1000 MG tablet Take 1 tablet (1,000 mg total) by mouth 2 (two) times daily with a meal. 180 tablet 3  . nitroGLYCERIN (NITROSTAT) 0.4 MG SL tablet Place 1 tablet (0.4 mg total) under the tongue every 5 (five) minutes as needed for chest pain (x 3 doses). 30 tablet 3  . omeprazole (PRILOSEC) 20 MG capsule Take 20 mg by mouth daily.    [name] terazosin (HYTRIN) 1 MG capsule Take 2 capsules (2 mg total) by mouth at bedtime. 30 capsule 11  . vitamin C (ASCORBIC ACID) 500 MG tablet Take 500 mg by mouth daily.     No current facility-administered medications on file prior to visit.     Allergies  Allergen Reactions  . Lisinopril Swelling and Other (See Comments)    Angioedema.   . Losartan Swelling and Other (See Comments)    Lips swell Angioedema   . Nifedipine Swelling    Sugar increase  . Triamterene Swelling  . Hydralazine Hcl Other (See Comments)    Fatigue; poor appetite  . Amoxicillin Rash  . Ampicillin Rash  . Carvedilol Other (See Comments)    Low heart rate      Assessment/Plan:  1. Hypertension - BP improved and now at goal <140/76mmHg with clinic readings and most of home readings. Will decrease terazosin back to 1mg  given episode of dizziness likely related to dose titration to 2mg  dose at last visit. He will continue on amlodipine 10mg  daily and clonidine 0.1mg  BID. Pt will call clinic with any problems, f/u as needed. Sees Dr Burt Knack in 1 month.   Megan E. Supple, PharmD, CPP, [facility] [address] [place], [place] [address] Phone: [phone]; Fax: [phone] 01/19/2017 8:58 AM

## 2017-01-19 NOTE — Telephone Encounter (Addendum)
Pt states he is going to get his Diabetic Shoes from the [facility] and needs Dr. Jacqualyn Posey to write him a prescription. 01/20/2017-I informed pt that Dr. Jacqualyn Posey was prescribing the Diabetic Shoes for him and offered to mail to his home address. Pt states that would be fine. Mailed rx as ordered by Dr. Jacqualyn Posey.

## 2017-01-20 NOTE — Telephone Encounter (Signed)
Can you please write for him a prescription:  Diabetic Shoes with accommodative inserts  Diagnosis:  Type II diabetes mellitus with neurological manifestations Pre-ulcerative callus sub metatarsal bilaterally Hammertoes

## 2017-02-09 ENCOUNTER — Ambulatory Visit: Payer: Medicare Other | Admitting: Podiatry

## 2017-02-12 ENCOUNTER — Encounter: Payer: Self-pay | Admitting: Cardiovascular Disease

## 2017-02-12 ENCOUNTER — Ambulatory Visit (INDEPENDENT_AMBULATORY_CARE_PROVIDER_SITE_OTHER): Payer: Medicare Other | Admitting: Cardiovascular Disease

## 2017-02-12 VITALS — BP 142/70 | HR 64 | Ht 74.0 in | Wt 229.4 lb

## 2017-02-12 DIAGNOSIS — I480 Paroxysmal atrial fibrillation: Secondary | ICD-10-CM

## 2017-02-12 DIAGNOSIS — I119 Hypertensive heart disease without heart failure: Secondary | ICD-10-CM | POA: Diagnosis not present

## 2017-02-12 DIAGNOSIS — I739 Peripheral vascular disease, unspecified: Secondary | ICD-10-CM

## 2017-02-12 DIAGNOSIS — E118 Type 2 diabetes mellitus with unspecified complications: Secondary | ICD-10-CM | POA: Diagnosis not present

## 2017-02-12 NOTE — Patient Instructions (Signed)

## 2017-02-12 NOTE — Progress Notes (Signed)
Cardiology Office Note Date:  02/12/2017   ID:  Thomas Marshall, Thomas Marshall January 10, 1941, MRN [id number]  PCP:  Cathlean Cower, MD  Cardiologist:  [name], MD    Chief Complaint  Patient presents with  . essential hypertension    follow up     History of Present Illness: Thomas Marshall is a 76 y.o. male who presents for Coronary artery disease status post CABG, peripheral arterial disease with history of carotid endarterectomy, paroxysmal atrial fibrillation, and hypertension. The patient has recently been followed in the hypertension clinic. Unfortunately he has a lot of medication intolerances. Treatment options are quite limited. Recently his medications were adjusted with amlodipine increased to 10 mg and there is os and increased to 2 mg.  The patient is here with his wife today. He is doing fairly well. States his blood pressure is better controlled over recent checks. Denies chest pain, chest pressure, shortness of breath recent heart palpitations. Continues to receive care through the [place] system.  He is tolerating his medicines well. Denies any bleeding or bruising problems.  Past Medical History:  Diagnosis Date  . AAA (abdominal aortic aneurysm) ([place])   . Arthritis   . Cancer (HCC)    skin & squamous cell  . Carotid artery disease ([place])    a. s/p R CEA 12/2015.  [name] CKD (chronic kidney disease), stage III    a. per historical labs.  . Coronary artery disease    a. s/p CABG 2004.  . Diabetes mellitus without complication ([place])   . GERD (gastroesophageal reflux disease)   . Hyperlipidemia   . Hypertension   . Medication intolerance Multiple   . PAF (paroxysmal atrial fibrillation) ([place])   . Pneumonia   . Sinus bradycardia    a. baseline HR 50s-60s, also h/o bradycardia on metoprolol and carvedilol    Past Surgical History:  Procedure Laterality Date  . ANKLE SURGERY     right fused  . BACK SURGERY     low back   . CHOLECYSTECTOMY N/A 12/07/2013   Procedure: LAPAROSCOPIC  CHOLECYSTECTOMY ;  Surgeon: [name], MD;  Location: [facility];  Service: General;  Laterality: N/A;  . COLONOSCOPY    . CORONARY ARTERY BYPASS GRAFT  2004   [place]  . ENDARTERECTOMY Right 12/10/2015   Procedure: Right Carotid ENDARTERECTOMY;  Surgeon: Conrad [place], MD;  Location: [facility];  Service: Vascular;  Laterality: Right;  . EXPLORATION POST OPERATIVE OPEN HEART    . FINGER SURGERY     skin graft on rt index  . FINGER SURGERY Right    right index finger.  [name] HERNIA REPAIR Right    RIH  . JOINT REPLACEMENT    . KNEE SURGERY     replacement on both knees  . PROSTATE SURGERY    . TONSILLECTOMY    . URINARY SURGERY     scar tissue    Current Outpatient Prescriptions  Medication Sig Dispense Refill  . amLODipine (NORVASC) 10 MG tablet Take 1 tablet (10 mg total) by mouth daily. 30 tablet 11  . apixaban (ELIQUIS) 5 MG TABS tablet Take 5 mg by mouth 2 (two) times daily.    [name] aspirin 81 MG tablet Take 81 mg by mouth daily.    [name] atorvastatin (LIPITOR) 40 MG tablet Take 40 mg by mouth daily.    . cholecalciferol (VITAMIN D) 1000 UNITS tablet Take 1,000 Units by mouth daily.    . cloNIDine (CATAPRES) 0.1 MG tablet TAKE  ONE TABLET BY MOUTH TWICE DAILY 180 tablet 2  . dofetilide (TIKOSYN) 250 MCG capsule Take 1 capsule (250 mcg total) by mouth 2 (two) times daily. 60 capsule 6  . glucose blood test strip Use to test blood glucose level 2 times a day. 100 each 12  . magnesium oxide (MAG-OX) 400 MG tablet Take 1 tablet (400 mg total) by mouth 4 (four) times daily. 360 tablet 3  . metFORMIN (GLUCOPHAGE) 1000 MG tablet Take 1 tablet (1,000 mg total) by mouth 2 (two) times daily with a meal. 180 tablet 3  . nitroGLYCERIN (NITROSTAT) 0.4 MG SL tablet Place 1 tablet (0.4 mg total) under the tongue every 5 (five) minutes as needed for chest pain (x 3 doses). 30 tablet 3  . omeprazole (PRILOSEC) 20 MG capsule Take 20 mg by mouth daily.    [name] terazosin (HYTRIN) 1 MG capsule Take 1 mg by  mouth at bedtime. 30 capsule 11  . vitamin C (ASCORBIC ACID) 500 MG tablet Take 500 mg by mouth daily.     No current facility-administered medications for this visit.     Allergies:   Lisinopril; Losartan; Nifedipine; Triamterene; Hydralazine hcl; Amoxicillin; Ampicillin; and Carvedilol   Social History:  The patient  reports that he quit smoking about 46 years ago. He has a 13.00 pack-year smoking history. He has never used smokeless tobacco. He reports that he does not drink alcohol or use drugs.   Family History:  The patient's  family history includes Cancer in his sister; Diabetes in his brother and sister; Heart disease in his brother, father, and mother; Hyperlipidemia in his brother and father; Hypertension in his brother, father, and sister; Stroke in his mother.    ROS:  Please see the history of present illness.  All other systems are reviewed and negative.    PHYSICAL EXAM: VS:  BP (!) 142/70   Pulse 64   Ht 6\' 2"  (1.88 m)   Wt 229 lb 6.4 oz (104.1 kg)   BMI 29.45 kg/m  , BMI Body mass index is 29.45 kg/m. GEN: Well nourished, well developed, in no acute distress  HEENT: normal  Neck: no JVD, no masses. Bilateral carotid bruits Cardiac: RRR with 2/6 SEM at the RUSB.               Respiratory:  clear to auscultation bilaterally, normal work of breathing GI: soft, nontender, nondistended, + BS MS: no deformity or atrophy  Ext: no pretibial edema, pedal pulses 2+= bilaterally Skin: warm and dry, no rash Neuro:  Strength and sensation are intact Psych: euthymic mood, full affect  EKG:  EKG is ordered today. The ekg ordered today shows normal sinus rhythm 65 bpm, within normal limits.  Recent Labs: 04/24/2016: TSH 2.39 09/16/2016: ALT 18 09/27/2016: Hemoglobin 13.1 10/26/2016: Magnesium 1.9 01/19/2017: BUN 23; Creatinine, Ser 1.41; Platelets 143; Potassium 4.5; Sodium 141   Lipid Panel     Component Value Date/Time   CHOL 127 09/16/2016 1455   TRIG 209.0 (H)  09/16/2016 1455   HDL 34.00 (L) 09/16/2016 1455   CHOLHDL 4 09/16/2016 1455   VLDL 41.8 (H) 09/16/2016 1455   LDLCALC 63 05/13/2015 1635   LDLDIRECT 67.0 09/16/2016 1455      Wt Readings from Last 3 Encounters:  02/12/17 229 lb 6.4 oz (104.1 kg)  10/30/16 223 lb (101.2 kg)  10/23/16 226 lb 8 oz (102.7 kg)     Cardiac Studies Reviewed: Echo 05-27-2016: Study Conclusions  - Left ventricle:  The cavity size was normal. Wall thickness was   increased in a pattern of mild LVH. Systolic function was normal.   The estimated ejection fraction was in the range of 55% to 60%.   Wall motion was normal; there were no regional wall motion   abnormalities. Features are consistent with a pseudonormal left   ventricular filling pattern, with concomitant abnormal relaxation   and increased filling pressure (grade 2 diastolic dysfunction). - Aortic valve: Trileaflet; moderately calcified leaflets.   Sclerosis without stenosis. - Aorta: Mildly dilated aortic root. - Mitral valve: Moderately to severely calcified annulus. There was   trivial regurgitation. - Left atrium: The atrium was moderately dilated. - Right ventricle: The cavity size was normal. Systolic function   was normal. - Right atrium: The atrium was mildly dilated. - Tricuspid valve: Peak RV-RA gradient (S): 26 mm Hg. - Pulmonary arteries: PA peak pressure: 29 mm Hg (S). - Inferior vena cava: The vessel was normal in size. The   respirophasic diameter changes were in the normal range (>= 50%),   consistent with normal central venous pressure.  Impressions:  - Normal LV size with mild LV hypertrophy. EF 55-60%. Moderate   diastolic dysfunction. Normal RV size and systolic function.   Aortic sclerosis without significant stenosis. Biatrial   enlargement.   ASSESSMENT AND PLAN: 1.  Paroxysmal atrial fibrillation: The patient is tolerating oral anticoagulation with Eliquis.  He is managed with a rhythm control strategy using  dofetilide. His QT interval is normal on today's EKG with a corrected QT of 416 ms. He will continue current therapy without changes. His most recent echocardiogram is reviewed.  2. Coronary artery disease, native vessel, without angina: Patient is stable.  3. Hyperlipidemia: Most recent labs reviewed. Lipids are at goal with LDL less than 70 mg/dL.  4. Hypertensive heart disease: Patient with findings of LVH and diastolic dysfunction on echo. Blood pressure has been difficult to control. Appears to be much better controlled on his current regimen. Options are somewhat limited because of medication intolerances. Will continue combination of amlodipine, clonidine, and Tara Zosyn.  5. Type 2 diabetes with CKD 3: Most recent hemoglobin A1c is 8.1 mg/dL. He is treated with meds 1000 mg twice daily. Metformin was increased at the time of his most recent lab work. Diverticulosis managed by his primary care physician. Unable to take ACE or ARB. Most recent creatinine 1.4 mg/dL - stable from prior labs.   6. Carotid stenosis: Patient with history of right carotid endarterectomy. Lexiscan reviewed with patency of his carotid endarterectomy site 40-59% stenosis on the left. Patient is asymptomatic at present. He is appropriately on antiplatelet therapy and a statin drug.  Current medicines are reviewed with the patient today.  The patient does not have concerns regarding medicines.  Labs/ tests ordered today include:  No orders of the defined types were placed in this encounter.   Disposition:   FU 6 months  Signed, [name], MD  02/12/2017 9:03 AM    [facility] [place], [place], [place]  [address] Phone: [phone]; Fax: [phone]

## 2017-02-23 ENCOUNTER — Telehealth: Payer: Self-pay | Admitting: Podiatry

## 2017-03-02 ENCOUNTER — Ambulatory Visit (INDEPENDENT_AMBULATORY_CARE_PROVIDER_SITE_OTHER): Payer: Medicare Other | Admitting: Podiatry

## 2017-03-02 ENCOUNTER — Encounter: Payer: Self-pay | Admitting: Podiatry

## 2017-03-02 DIAGNOSIS — E1140 Type 2 diabetes mellitus with diabetic neuropathy, unspecified: Secondary | ICD-10-CM | POA: Diagnosis not present

## 2017-03-02 DIAGNOSIS — M79674 Pain in right toe(s): Secondary | ICD-10-CM | POA: Diagnosis not present

## 2017-03-02 DIAGNOSIS — B351 Tinea unguium: Secondary | ICD-10-CM

## 2017-03-02 DIAGNOSIS — M79675 Pain in left toe(s): Secondary | ICD-10-CM | POA: Diagnosis not present

## 2017-03-03 NOTE — Progress Notes (Signed)
Patient ID: JOEY LIERMAN, male   DOB: 12-03-1940, 76 y.o.   MRN: [id number]  Subjective: 76 y.o. returns the office today for painful, elongated, thickened toenails which he cannot trim himself.  No redness or swelling. Denies any acute changes since last appointment and no new complaints today. Denies any systemic complaints such as fevers, chills, nausea, vomiting.   Objective: AAO 3, NAD DP/PT pulses palpable, CRT less than 3 seconds Protective sensation decreased with Simms Weinstein monofilament Nails hypertrophic, dystrophic, elongated, brittle, discolored 10. There is tenderness overlying the nails 1-5 bilaterally. There is no surrounding erythema or drainage along the nail sites. Minimal hyperkeratotic lesion right fifth metatarsal base. No underlying ulcer, drainage or signs of infection.  No other areas of tenderness bilateral lower extremities. No overlying edema, erythema, increased warmth. No pain with calf compression, swelling, warmth, erythema.  Assessment: Patient presents with symptomatic onychomycosis; minimal hyperkeratotic tissue  Plan: -Treatment options including alternatives, risks, complications were discussed -Nails sharply debrided 10 without complication/bleeding. -Lightly debrided the hyperkeratotic lesion 1 without complications -He is going to come back on Friday to get measured for diabetic shoes.  -Discussed daily foot inspection. If there are any changes, to call the office immediately.  -Follow-up in 3 months or sooner if any problems are to arise. In the meantime, encouraged to call the office with any questions, concerns, changes symptoms.  Celesta Gentile, DPM

## 2017-03-08 ENCOUNTER — Other Ambulatory Visit: Payer: Medicare Other

## 2017-03-16 ENCOUNTER — Encounter: Payer: Self-pay | Admitting: Internal Medicine

## 2017-03-16 ENCOUNTER — Other Ambulatory Visit (INDEPENDENT_AMBULATORY_CARE_PROVIDER_SITE_OTHER): Payer: Medicare Other

## 2017-03-16 ENCOUNTER — Ambulatory Visit (INDEPENDENT_AMBULATORY_CARE_PROVIDER_SITE_OTHER): Payer: Medicare Other | Admitting: Internal Medicine

## 2017-03-16 VITALS — BP 130/62 | HR 52 | Temp 97.8°F | Ht 74.0 in | Wt 228.0 lb

## 2017-03-16 DIAGNOSIS — I10 Essential (primary) hypertension: Secondary | ICD-10-CM | POA: Diagnosis not present

## 2017-03-16 DIAGNOSIS — N182 Chronic kidney disease, stage 2 (mild): Secondary | ICD-10-CM

## 2017-03-16 DIAGNOSIS — E1122 Type 2 diabetes mellitus with diabetic chronic kidney disease: Secondary | ICD-10-CM | POA: Diagnosis not present

## 2017-03-16 DIAGNOSIS — E7800 Pure hypercholesterolemia, unspecified: Secondary | ICD-10-CM

## 2017-03-16 DIAGNOSIS — N320 Bladder-neck obstruction: Secondary | ICD-10-CM | POA: Diagnosis not present

## 2017-03-16 DIAGNOSIS — N183 Chronic kidney disease, stage 3 unspecified: Secondary | ICD-10-CM

## 2017-03-16 LAB — BASIC METABOLIC PANEL
BUN: 25 mg/dL — AB (ref 6–23)
CO2: 30 meq/L (ref 19–32)
Calcium: 9 mg/dL (ref 8.4–10.5)
Chloride: 104 mEq/L (ref 96–112)
Creatinine, Ser: 1.48 mg/dL (ref 0.40–1.50)
GFR: 48.9 mL/min — AB (ref >60.00)
GLUCOSE: 126 mg/dL — AB (ref 70–99)
Potassium: 4.8 mEq/L (ref 3.5–5.1)
Sodium: 141 mEq/L (ref 135–145)

## 2017-03-16 LAB — URINALYSIS, ROUTINE W REFLEX MICROSCOPIC
BILIRUBIN URINE: NEGATIVE
Hgb urine dipstick: NEGATIVE
LEUKOCYTES UA: NEGATIVE
NITRITE: NEGATIVE
PH: 6.5 (ref 5.0–8.0)
SPECIFIC GRAVITY, URINE: 1.02 (ref 1.000–1.030)
UROBILINOGEN UA: 0.2 (ref 0.0–1.0)
Urine Glucose: NEGATIVE

## 2017-03-16 LAB — HEPATIC FUNCTION PANEL
ALBUMIN: 4.2 g/dL (ref 3.5–5.2)
ALK PHOS: 68 U/L (ref 39–117)
ALT: 13 U/L (ref 0–53)
AST: 14 U/L (ref 0–37)
Bilirubin, Direct: 0.1 mg/dL (ref 0.0–0.3)
TOTAL PROTEIN: 6.5 g/dL (ref 6.0–8.3)
Total Bilirubin: 0.5 mg/dL (ref 0.2–1.2)

## 2017-03-16 LAB — LIPID PANEL
CHOLESTEROL: 107 mg/dL (ref 0–200)
HDL: 37.7 mg/dL — ABNORMAL LOW (ref >39.00)
LDL CALC: 56 mg/dL (ref 0–99)
NonHDL: 69.45
TRIGLYCERIDES: 68 mg/dL (ref 0.0–149.0)
Total CHOL/HDL Ratio: 3
VLDL: 13.6 mg/dL (ref 0.0–40.0)

## 2017-03-16 LAB — CBC WITH DIFFERENTIAL/PLATELET
BASOS ABS: 0.1 10*3/uL (ref 0.0–0.1)
Basophils Relative: 1 % (ref 0.0–3.0)
EOS ABS: 0.4 10*3/uL (ref 0.0–0.7)
EOS PCT: 9 % — AB (ref 0.0–5.0)
HCT: 38.3 % — ABNORMAL LOW (ref 39.0–52.0)
Hemoglobin: 12.5 g/dL — ABNORMAL LOW (ref 13.0–17.0)
LYMPHS ABS: 1.2 10*3/uL (ref 0.7–4.0)
Lymphocytes Relative: 24.9 % (ref 12.0–46.0)
MCHC: 32.7 g/dL (ref 30.0–36.0)
MCV: 92.7 fl (ref 78.0–100.0)
Monocytes Absolute: 0.5 10*3/uL (ref 0.1–1.0)
Monocytes Relative: 9.3 % (ref 3.0–12.0)
NEUTROS PCT: 55.8 % (ref 43.0–77.0)
Neutro Abs: 2.7 10*3/uL (ref 1.4–7.7)
Platelets: 139 10*3/uL — ABNORMAL LOW (ref 150.0–400.0)
RBC: 4.13 Mil/uL — ABNORMAL LOW (ref 4.22–5.81)
RDW: 14.8 % (ref 11.5–15.5)
WBC: 4.9 10*3/uL (ref 4.0–10.5)

## 2017-03-16 LAB — PSA: PSA: 1.02 ng/mL (ref 0.10–4.00)

## 2017-03-16 LAB — TSH: TSH: 3.47 u[IU]/mL (ref 0.35–4.50)

## 2017-03-16 LAB — MICROALBUMIN / CREATININE URINE RATIO
CREATININE, U: 345.8 mg/dL
MICROALB/CREAT RATIO: 2.7 mg/g (ref 0.0–30.0)
Microalb, Ur: 9.5 mg/dL — ABNORMAL HIGH (ref 0.0–1.9)

## 2017-03-16 LAB — HEMOGLOBIN A1C: Hgb A1c MFr Bld: 7.1 % — ABNORMAL HIGH (ref 4.6–6.5)

## 2017-03-16 NOTE — Progress Notes (Signed)
Pre visit review using our clinic review tool, if applicable. No additional management support is needed unless otherwise documented below in the visit note. 

## 2017-03-16 NOTE — Assessment & Plan Note (Signed)
Goal ldl < 70, stable overall by history and exam, recent data reviewed with pt, and pt to continue medical treatment as before,  to f/u any worsening symptoms or concerns Lab Results  Component Value Date   [place] 63 05/13/2015   For f/u lab

## 2017-03-16 NOTE — Progress Notes (Signed)
Subjective:    Patient ID: Thomas Marshall, male    DOB: [birth year], 76 y.o.   MRN: [id number]  HPI   Here for yearly f/u;  Overall doing ok;  Pt denies Chest pain, worsening SOB, DOE, wheezing, orthopnea, PND, worsening LE edema, palpitations, dizziness or syncope.  Pt denies neurological change such as new headache, facial or extremity weakness.  Pt denies polydipsia, polyuria, or low sugar symptoms. Pt states overall good compliance with treatment and medications, good tolerability, and has been trying to follow appropriate diet.  Pt denies worsening depressive symptoms, suicidal ideation or panic. No fever, night sweats, wt loss, loss of appetite, or other constitutional symptoms.  Pt states good ability with ADL's, has low fall risk, home safety reviewed and adequate, no other significant changes in hearing or vision, and occasionally active with exercise.   Past Medical History:  Diagnosis Date  . AAA (abdominal aortic aneurysm) ([place])   . Arthritis   . Cancer (HCC)    skin & squamous cell  . Carotid artery disease ([place])    a. s/p R CEA 12/2015.  [name] CKD (chronic kidney disease), stage III    a. per historical labs.  . Coronary artery disease    a. s/p CABG 2004.  . Diabetes mellitus without complication ([place])   . GERD (gastroesophageal reflux disease)   . Hyperlipidemia   . Hypertension   . Medication intolerance Multiple   . PAF (paroxysmal atrial fibrillation) ([place])   . Pneumonia   . Sinus bradycardia    a. baseline HR 50s-60s, also h/o bradycardia on metoprolol and carvedilol   Past Surgical History:  Procedure Laterality Date  . ANKLE SURGERY     right fused  . BACK SURGERY     low back   . CHOLECYSTECTOMY N/A 12/07/2013   Procedure: LAPAROSCOPIC CHOLECYSTECTOMY ;  Surgeon: [name], MD;  Location: [facility];  Service: General;  Laterality: N/A;  . COLONOSCOPY    . CORONARY ARTERY BYPASS GRAFT  2004   [place]  . ENDARTERECTOMY Right 12/10/2015   Procedure: Right  Carotid ENDARTERECTOMY;  Surgeon: Conrad [place]hatchi, MD;  Location: [facility];  Service: Vascular;  Laterality: Right;  . EXPLORATION POST OPERATIVE OPEN HEART    . FINGER SURGERY     skin graft on rt index  . FINGER SURGERY Right    right index finger.  [name] HERNIA REPAIR Right    RIH  . JOINT REPLACEMENT    . KNEE SURGERY     replacement on both knees  . PROSTATE SURGERY    . TONSILLECTOMY    . URINARY SURGERY     scar tissue    reports that he quit smoking about 46 years ago. He has a 13.00 pack-year smoking history. He has never used smokeless tobacco. He reports that he does not drink alcohol or use drugs. family history includes Cancer in his sister; Diabetes in his brother and sister; Heart disease in his brother, father, and mother; Hyperlipidemia in his brother and father; Hypertension in his brother, father, and sister; Stroke in his mother. Allergies  Allergen Reactions  . Lisinopril Swelling and Other (See Comments)    Angioedema.   . Losartan Swelling and Other (See Comments)    Lips swell Angioedema   . Nifedipine Swelling    Sugar increase  . Triamterene Swelling  . Hydralazine Hcl Other (See Comments)    Fatigue; poor appetite  . Amoxicillin Rash  . Ampicillin Rash  .  Carvedilol Other (See Comments)    Low heart rate    Current Outpatient Prescriptions on File Prior to Visit  Medication Sig Dispense Refill  . amLODipine (NORVASC) 10 MG tablet Take 1 tablet (10 mg total) by mouth daily. 30 tablet 11  . apixaban (ELIQUIS) 5 MG TABS tablet Take 5 mg by mouth 2 (two) times daily.    [name] aspirin 81 MG tablet Take 81 mg by mouth daily.    [name] atorvastatin (LIPITOR) 40 MG tablet Take 40 mg by mouth daily.    . cholecalciferol (VITAMIN D) 1000 UNITS tablet Take 1,000 Units by mouth daily.    . cloNIDine (CATAPRES) 0.1 MG tablet TAKE ONE TABLET BY MOUTH TWICE DAILY 180 tablet 2  . dofetilide (TIKOSYN) 250 MCG capsule Take 1 capsule (250 mcg total) by mouth 2 (two) times daily. 60  capsule 6  . glucose blood test strip Use to test blood glucose level 2 times a day. 100 each 12  . magnesium oxide (MAG-OX) 400 MG tablet Take 1 tablet (400 mg total) by mouth 4 (four) times daily. 360 tablet 3  . metFORMIN (GLUCOPHAGE) 1000 MG tablet Take 1 tablet (1,000 mg total) by mouth 2 (two) times daily with a meal. 180 tablet 3  . nitroGLYCERIN (NITROSTAT) 0.4 MG SL tablet Place 1 tablet (0.4 mg total) under the tongue every 5 (five) minutes as needed for chest pain (x 3 doses). 30 tablet 3  . omeprazole (PRILOSEC) 20 MG capsule Take 20 mg by mouth daily.    [name] terazosin (HYTRIN) 1 MG capsule Take 1 mg by mouth at bedtime. 30 capsule 11  . vitamin C (ASCORBIC ACID) 500 MG tablet Take 500 mg by mouth daily.     No current facility-administered medications on file prior to visit.    Review of Systems Constitutional: Negative for other unusual diaphoresis, sweats, appetite or weight changes HENT: Negative for other worsening hearing loss, ear pain, facial swelling, mouth sores or neck stiffness.   Eyes: Negative for other worsening pain, redness or other visual disturbance.  Respiratory: Negative for other stridor or swelling Cardiovascular: Negative for other palpitations or other chest pain  Gastrointestinal: Negative for worsening diarrhea or loose stools, blood in stool, distention or other pain Genitourinary: Negative for hematuria, flank pain or other change in urine volume.  Musculoskeletal: Negative for myalgias or other joint swelling.  Skin: Negative for other color change, or other wound or worsening drainage.  Neurological: Negative for other syncope or numbness. Hematological: Negative for other adenopathy or swelling Psychiatric/Behavioral: Negative for hallucinations, other worsening agitation, SI, self-injury, or new decreased concentration All other system neg per pt    Objective:   Physical Exam BP 130/62   Pulse (!) 52   Temp 97.8 F (36.6 C) (Oral)   Ht 6\' 2"   (1.88 m)   Wt 228 lb (103.4 kg)   SpO2 99%   BMI 29.27 kg/m  VS noted,  Constitutional: Pt is oriented to person, place, and time. Appears well-developed and well-nourished, in no significant distress and comfortable Head: Normocephalic and atraumatic  Eyes: Conjunctivae and EOM are normal. Pupils are equal, round, and reactive to light Right Ear: External ear normal without discharge Left Ear: External ear normal without discharge Nose: Nose without discharge or deformity Mouth/Throat: Oropharynx is without other ulcerations and moist  Neck: Normal range of motion. Neck supple. No JVD present. No tracheal deviation present or significant neck LA or mass Cardiovascular: Normal rate, regular rhythm, normal heart  sounds and intact distal pulses.   Pulmonary/Chest: WOB normal and breath sounds without rales or wheezing  Abdominal: Soft. Bowel sounds are normal. NT. No HSM  Musculoskeletal: Normal range of motion. Exhibits no edema Lymphadenopathy: Has no other cervical adenopathy.  Neurological: Pt is alert and oriented to person, place, and time. Pt has normal reflexes. No cranial nerve deficit. Motor grossly intact, Gait intact Skin: Skin is warm and dry. No rash noted or new ulcerations Psychiatric:  Has normal mood and affect. Behavior is normal without agitation No other exam findings    Assessment & Plan:

## 2017-03-16 NOTE — Assessment & Plan Note (Signed)
stable overall by history and exam, recent data reviewed with pt, and pt to continue medical treatment as before,  to f/u any worsening symptoms or concerns Lab Results  Component Value Date   HGBA1C 8.1 (H) 09/16/2016   To check a1c on increased metformin since last visit

## 2017-03-16 NOTE — Patient Instructions (Addendum)
Please continue all other medications as before, and refills have been done if requested.  Please have the pharmacy call with any other refills you may need.  Please continue your efforts at being more active, low cholesterol diet, and weight control.  You are otherwise up to date with prevention measures today.  Please keep your appointments with your specialists as you may have planned  Please go to the LAB in the Basement (turn left off the elevator) for the tests to be done today  You will be contacted by phone if any changes need to be made immediately.  Otherwise, you will receive a letter about your results with an explanation, but please check with MyChart first.  Please remember to sign up for MyChart if you have not done so, as this will be important to you in the future with finding out test results, communicating by private email, and scheduling acute appointments online when needed.  If you have Medicare related insurance (such as traditional Medicare, [employer] or [employer], or similar), Please make an appointment at the [facility] with Sharee Pimple, the [employer], for your Wellness Visit in this office, which is a benefit with your insurance.  Please return in 6 months, or sooner if needed

## 2017-03-16 NOTE — Assessment & Plan Note (Signed)
stable overall by history and exam, recent data reviewed with pt, and pt to continue medical treatment as before,  to f/u any worsening symptoms or concerns BP Readings from Last 3 Encounters:  03/16/17 130/62  02/12/17 (!) 142/70  01/19/17 128/62

## 2017-03-16 NOTE — Assessment & Plan Note (Signed)
With some improvement to 1.4  Cr in feb, for f/u lab, o/w stable overall by history and exam, recent data reviewed with pt, and pt to continue medical treatment as before,  to f/u any worsening symptoms or concerns

## 2017-03-24 DIAGNOSIS — L570 Actinic keratosis: Secondary | ICD-10-CM | POA: Diagnosis not present

## 2017-03-24 DIAGNOSIS — L578 Other skin changes due to chronic exposure to nonionizing radiation: Secondary | ICD-10-CM | POA: Diagnosis not present

## 2017-03-24 DIAGNOSIS — L821 Other seborrheic keratosis: Secondary | ICD-10-CM | POA: Diagnosis not present

## 2017-04-08 ENCOUNTER — Encounter: Payer: Self-pay | Admitting: Internal Medicine

## 2017-04-08 ENCOUNTER — Ambulatory Visit (INDEPENDENT_AMBULATORY_CARE_PROVIDER_SITE_OTHER): Payer: Medicare Other | Admitting: Internal Medicine

## 2017-04-08 VITALS — BP 154/64 | HR 61 | Ht 74.0 in | Wt 226.0 lb

## 2017-04-08 DIAGNOSIS — E1122 Type 2 diabetes mellitus with diabetic chronic kidney disease: Secondary | ICD-10-CM | POA: Diagnosis not present

## 2017-04-08 DIAGNOSIS — N182 Chronic kidney disease, stage 2 (mild): Secondary | ICD-10-CM | POA: Diagnosis not present

## 2017-04-08 DIAGNOSIS — I10 Essential (primary) hypertension: Secondary | ICD-10-CM

## 2017-04-08 DIAGNOSIS — R21 Rash and other nonspecific skin eruption: Secondary | ICD-10-CM

## 2017-04-08 MED ORDER — PREDNISONE 10 MG PO TABS: ORAL_TABLET | ORAL | 0 refills | Status: DC

## 2017-04-08 MED ORDER — METHYLPREDNISOLONE ACETATE 80 MG/ML IJ SUSP
80.0000 mg | Freq: Once | INTRAMUSCULAR | Status: AC
  Administered 2017-04-08: 80 mg via INTRAMUSCULAR

## 2017-04-08 NOTE — Patient Instructions (Addendum)
You had the steroid shot today  Please take all new medication as prescribed - the prednisone  You can take benadryl by mouth at bedtime for sleep If needed, or the benadryl cream for itching if needed during the day  Please continue all other medications as before, and refills have been done if requested.  Please have the pharmacy call with any other refills you may need.  Please keep your appointments with your specialists as you may have planned

## 2017-04-08 NOTE — Progress Notes (Signed)
Subjective:    Patient ID: Thomas Marshall, male    DOB: 12/21/1940, 76 y.o.   MRN: [id number]  HPI  Here with itchy erythem rash curiously only to sides of torso, no obvious exposure to contact dermatitis agents, fever, pain, or other swelling. Pt denies chest pain, increased sob or doe, wheezing, orthopnea, PND, increased LE swelling, palpitations, dizziness or syncope.  Pt denies new neurological symptoms such as new headache, or facial or extremity weakness or numbness   Pt denies polydipsia, polyuria, Pt states BP has been < 140/90 at home and checked recently Lab Results  Component Value Date   HGBA1C 7.1 (H) 03/16/2017   Past Medical History:  Diagnosis Date  . AAA (abdominal aortic aneurysm) ([place])   . Arthritis   . Cancer (HCC)    skin & squamous cell  . Carotid artery disease ([place])    a. s/p R CEA 12/2015.  [name] CKD (chronic kidney disease), stage III    a. per historical labs.  . Coronary artery disease    a. s/p CABG 2004.  . Diabetes mellitus without complication ([place])   . GERD (gastroesophageal reflux disease)   . Hyperlipidemia   . Hypertension   . Medication intolerance Multiple   . PAF (paroxysmal atrial fibrillation) ([place])   . Pneumonia   . Sinus bradycardia    a. baseline HR 50s-60s, also h/o bradycardia on metoprolol and carvedilol   Past Surgical History:  Procedure Laterality Date  . ANKLE SURGERY     right fused  . BACK SURGERY     low back   . CHOLECYSTECTOMY N/A 12/07/2013   Procedure: LAPAROSCOPIC CHOLECYSTECTOMY ;  Surgeon: [name], MD;  Location: [facility];  Service: General;  Laterality: N/A;  . COLONOSCOPY    . CORONARY ARTERY BYPASS GRAFT  2004   [place]  . ENDARTERECTOMY Right 12/10/2015   Procedure: Right Carotid ENDARTERECTOMY;  Surgeon: Conrad [place], MD;  Location: [facility];  Service: Vascular;  Laterality: Right;  . EXPLORATION POST OPERATIVE OPEN HEART    . FINGER SURGERY     skin graft on rt index  . FINGER SURGERY Right    right  index finger.  [name] HERNIA REPAIR Right    [facility]  . JOINT REPLACEMENT    . KNEE SURGERY     replacement on both knees  . PROSTATE SURGERY    . TONSILLECTOMY    . URINARY SURGERY     scar tissue    reports that he quit smoking about 46 years ago. He has a 13.00 pack-year smoking history. He has never used smokeless tobacco. He reports that he does not drink alcohol or use drugs. family history includes Cancer in his sister; Diabetes in his brother and sister; Heart disease in his brother, father, and mother; Hyperlipidemia in his brother and father; Hypertension in his brother, father, and sister; Stroke in his mother. Allergies  Allergen Reactions  . Lisinopril Swelling and Other (See Comments)    Angioedema.   . Losartan Swelling and Other (See Comments)    Lips swell Angioedema   . Nifedipine Swelling    Sugar increase  . Triamterene Swelling  . Hydralazine Hcl Other (See Comments)    Fatigue; poor appetite  . Amoxicillin Rash  . Ampicillin Rash  . Carvedilol Other (See Comments)    Low heart rate    Current Outpatient Prescriptions on File Prior to Visit  Medication Sig Dispense Refill  . amLODipine (NORVASC) 10 MG  tablet Take 1 tablet (10 mg total) by mouth daily. 30 tablet 11  . apixaban (ELIQUIS) 5 MG TABS tablet Take 5 mg by mouth 2 (two) times daily.    [name] aspirin 81 MG tablet Take 81 mg by mouth daily.    [name] atorvastatin (LIPITOR) 40 MG tablet Take 40 mg by mouth daily.    . cholecalciferol (VITAMIN D) 1000 UNITS tablet Take 1,000 Units by mouth daily.    . cloNIDine (CATAPRES) 0.1 MG tablet TAKE ONE TABLET BY MOUTH TWICE DAILY 180 tablet 2  . dofetilide (TIKOSYN) 250 MCG capsule Take 1 capsule (250 mcg total) by mouth 2 (two) times daily. 60 capsule 6  . glucose blood test strip Use to test blood glucose level 2 times a day. 100 each 12  . magnesium oxide (MAG-OX) 400 MG tablet Take 1 tablet (400 mg total) by mouth 4 (four) times daily. 360 tablet 3  . metFORMIN  (GLUCOPHAGE) 1000 MG tablet Take 1 tablet (1,000 mg total) by mouth 2 (two) times daily with a meal. 180 tablet 3  . nitroGLYCERIN (NITROSTAT) 0.4 MG SL tablet Place 1 tablet (0.4 mg total) under the tongue every 5 (five) minutes as needed for chest pain (x 3 doses). 30 tablet 3  . omeprazole (PRILOSEC) 20 MG capsule Take 20 mg by mouth daily.    [name] terazosin (HYTRIN) 1 MG capsule Take 1 mg by mouth at bedtime. 30 capsule 11  . vitamin C (ASCORBIC ACID) 500 MG tablet Take 500 mg by mouth daily.     No current facility-administered medications on file prior to visit.    Review of Systems  Constitutional: Negative for other unusual diaphoresis or sweats HENT: Negative for ear discharge or swelling Eyes: Negative for other worsening visual disturbances Respiratory: Negative for stridor or other swelling  Gastrointestinal: Negative for worsening distension or other blood Genitourinary: Negative for retention or other urinary change Musculoskeletal: Negative for other MSK pain or swelling Skin: Negative for color change or other new lesions Neurological: Negative for worsening tremors and other numbness  Psychiatric/Behavioral: Negative for worsening agitation or other fatigue All other system neg per pt    Objective:   Physical Exam BP (!) 154/64   Pulse 61   Ht 6\' 2"  (1.88 m)   Wt 226 lb (102.5 kg)   SpO2 98%   BMI 29.02 kg/m  VS noted,  Constitutional: Pt appears in NAD HENT: Head: NCAT.  Right Ear: External ear normal.  Left Ear: External ear normal.  Eyes: . Pupils are equal, round, and reactive to light. Conjunctivae and EOM are normal Nose: without d/c or deformity Neck: Neck supple. Gross normal ROM Cardiovascular: Normal rate and regular rhythm.   Pulmonary/Chest: Effort normal and breath sounds without rales or wheezing.  Abd:  Soft, NT, ND, + BS, no organomegaly Neurological: Pt is alert. At baseline orientation, motor grossly intact Skin: Skin is warm. + blanching  numerous areas erythematous nontender nonraised rash to sides of torso bilat only, other new lesions, no LE edema Psychiatric: Pt behavior is normal without agitation  No other exam findings     Assessment & Plan:

## 2017-04-11 NOTE — Assessment & Plan Note (Signed)
stable overall by history and exam, recent data reviewed with pt, and pt to continue medical treatment as before,  to f/u any worsening symptoms or concerns Lab Results  Component Value Date   HGBA1C 7.1 (H) 03/16/2017   Pt to call for onset polys with steroid tx or cbg > 200

## 2017-04-11 NOTE — Assessment & Plan Note (Signed)
stable overall by history and exam, recent data reviewed with pt, and pt to continue medical treatment as before,  to f/u any worsening symptoms or concerns, declines further med tx change BP Readings from Last 3 Encounters:  04/08/17 (!) 154/64  03/16/17 130/62  02/12/17 (!) 142/70

## 2017-04-11 NOTE — Assessment & Plan Note (Signed)
Suspect allergic, though is in unusual distribution, also consider tinea but more likely allergic, for depomedrol IM 80, predpac asd, benadryl cream otc prn

## 2017-04-13 ENCOUNTER — Telehealth: Payer: Self-pay | Admitting: *Deleted

## 2017-04-13 NOTE — Telephone Encounter (Signed)
Left message for patient to call and schedule an appointment to pick up diabetic shoes.

## 2017-04-21 ENCOUNTER — Ambulatory Visit: Payer: Medicare Other

## 2017-04-30 ENCOUNTER — Encounter: Payer: Self-pay | Admitting: Internal Medicine

## 2017-04-30 ENCOUNTER — Ambulatory Visit (INDEPENDENT_AMBULATORY_CARE_PROVIDER_SITE_OTHER): Payer: Medicare Other | Admitting: Internal Medicine

## 2017-04-30 VITALS — BP 144/86 | HR 61 | Ht 74.0 in | Wt 221.0 lb

## 2017-04-30 DIAGNOSIS — N182 Chronic kidney disease, stage 2 (mild): Secondary | ICD-10-CM | POA: Diagnosis not present

## 2017-04-30 DIAGNOSIS — R21 Rash and other nonspecific skin eruption: Secondary | ICD-10-CM

## 2017-04-30 DIAGNOSIS — I10 Essential (primary) hypertension: Secondary | ICD-10-CM

## 2017-04-30 DIAGNOSIS — E1122 Type 2 diabetes mellitus with diabetic chronic kidney disease: Secondary | ICD-10-CM | POA: Diagnosis not present

## 2017-04-30 MED ORDER — TRIAMCINOLONE ACETONIDE 0.1 % EX CREA: 1.0000 "application " | TOPICAL_CREAM | Freq: Two times a day (BID) | CUTANEOUS | 1 refills | Status: AC

## 2017-04-30 MED ORDER — PREDNISONE 10 MG PO TABS: ORAL_TABLET | ORAL | 0 refills | Status: DC

## 2017-04-30 MED ORDER — METHYLPREDNISOLONE ACETATE 80 MG/ML IJ SUSP
80.0000 mg | Freq: Once | INTRAMUSCULAR | Status: AC
  Administered 2017-04-30: 80 mg via INTRAMUSCULAR

## 2017-04-30 NOTE — Assessment & Plan Note (Signed)
stable overall by history and exam, recent data reviewed with pt, and pt to continue medical treatment as before,  to f/u any worsening symptoms or concerns Lab Results  Component Value Date   HGBA1C 7.1 (H) 03/16/2017   Pt to call for onset polys or cbg > 200

## 2017-04-30 NOTE — Progress Notes (Signed)
Subjective:    Patient ID: Thomas Marshall, male    DOB: 04/23/1941, 76 y.o.   MRN: [id number]  HPI  Here to f/u with recurrent rash; has most care supported by [facility], but today with recurrence of similar nontender erythem  - non raised non vesicular  - to bilat flank areas.  Quite itchy and he states the only change he is aware Is the eliquis and it says on the pharmacy info that this can cause rash.  Also has several small red nontender spots to crown,  No fever, pain and Pt denies chest pain, increased sob or doe, wheezing, orthopnea, PND, increased LE swelling, palpitations, dizziness or syncope.  Pt denies new neurological symptoms such as new headache, or facial or extremity weakness or numbness   Pt denies polydipsia, polyuria Past Medical History:  Diagnosis Date  . AAA (abdominal aortic aneurysm) ([place])   . Arthritis   . Cancer (HCC)    skin & squamous cell  . Carotid artery disease ([place])    a. s/p R CEA 12/2015.  [name] CKD (chronic kidney disease), stage III    a. per historical labs.  . Coronary artery disease    a. s/p CABG 2004.  . Diabetes mellitus without complication ([place])   . GERD (gastroesophageal reflux disease)   . Hyperlipidemia   . Hypertension   . Medication intolerance Multiple   . PAF (paroxysmal atrial fibrillation) ([place])   . Pneumonia   . Sinus bradycardia    a. baseline HR 50s-60s, also h/o bradycardia on metoprolol and carvedilol   Past Surgical History:  Procedure Laterality Date  . ANKLE SURGERY     right fused  . BACK SURGERY     low back   . CHOLECYSTECTOMY N/A 12/07/2013   Procedure: LAPAROSCOPIC CHOLECYSTECTOMY ;  Surgeon: [name], MD;  Location: [facility];  Service: General;  Laterality: N/A;  . COLONOSCOPY    . CORONARY ARTERY BYPASS GRAFT  2004   [place]  . ENDARTERECTOMY Right 12/10/2015   Procedure: Right Carotid ENDARTERECTOMY;  Surgeon: Conrad [place], MD;  Location: [facility];  Service: Vascular;  Laterality: Right;  . EXPLORATION POST  OPERATIVE OPEN HEART    . FINGER SURGERY     skin graft on rt index  . FINGER SURGERY Right    right index finger.  [name] HERNIA REPAIR Right    RIH  . JOINT REPLACEMENT    . KNEE SURGERY     replacement on both knees  . PROSTATE SURGERY    . TONSILLECTOMY    . URINARY SURGERY     scar tissue    reports that he quit smoking about 46 years ago. He has a 13.00 pack-year smoking history. He has never used smokeless tobacco. He reports that he does not drink alcohol or use drugs. family history includes Cancer in his sister; Diabetes in his brother and sister; Heart disease in his brother, father, and mother; Hyperlipidemia in his brother and father; Hypertension in his brother, father, and sister; Stroke in his mother. Allergies  Allergen Reactions  . Lisinopril Swelling and Other (See Comments)    Angioedema.   . Losartan Swelling and Other (See Comments)    Lips swell Angioedema   . Nifedipine Swelling    Sugar increase  . Triamterene Swelling  . Hydralazine Hcl Other (See Comments)    Fatigue; poor appetite  . Amoxicillin Rash  . Ampicillin Rash  . Carvedilol Other (See Comments)  Low heart rate    Current Outpatient Prescriptions on File Prior to Visit  Medication Sig Dispense Refill  . amLODipine (NORVASC) 10 MG tablet Take 1 tablet (10 mg total) by mouth daily. 30 tablet 11  . apixaban (ELIQUIS) 5 MG TABS tablet Take 5 mg by mouth 2 (two) times daily.    [name] aspirin 81 MG tablet Take 81 mg by mouth daily.    [name] atorvastatin (LIPITOR) 40 MG tablet Take 40 mg by mouth daily.    . cholecalciferol (VITAMIN D) 1000 UNITS tablet Take 1,000 Units by mouth daily.    . cloNIDine (CATAPRES) 0.1 MG tablet TAKE ONE TABLET BY MOUTH TWICE DAILY 180 tablet 2  . dofetilide (TIKOSYN) 250 MCG capsule Take 1 capsule (250 mcg total) by mouth 2 (two) times daily. 60 capsule 6  . glucose blood test strip Use to test blood glucose level 2 times a day. 100 each 12  . magnesium oxide (MAG-OX) 400  MG tablet Take 1 tablet (400 mg total) by mouth 4 (four) times daily. 360 tablet 3  . metFORMIN (GLUCOPHAGE) 1000 MG tablet Take 1 tablet (1,000 mg total) by mouth 2 (two) times daily with a meal. 180 tablet 3  . nitroGLYCERIN (NITROSTAT) 0.4 MG SL tablet Place 1 tablet (0.4 mg total) under the tongue every 5 (five) minutes as needed for chest pain (x 3 doses). 30 tablet 3  . omeprazole (PRILOSEC) 20 MG capsule Take 20 mg by mouth daily.    [name] terazosin (HYTRIN) 1 MG capsule Take 1 mg by mouth at bedtime. 30 capsule 11  . vitamin C (ASCORBIC ACID) 500 MG tablet Take 500 mg by mouth daily.     No current facility-administered medications on file prior to visit.    Review of Systems  Constitutional: Negative for other unusual diaphoresis or sweats HENT: Negative for ear discharge or swelling Eyes: Negative for other worsening visual disturbances Respiratory: Negative for stridor or other swelling  Gastrointestinal: Negative for worsening distension or other blood Genitourinary: Negative for retention or other urinary change Musculoskeletal: Negative for other MSK pain or swelling Skin: Negative for color change or other new lesions Neurological: Negative for worsening tremors and other numbness  Psychiatric/Behavioral: Negative for worsening agitation or other fatigue All other system neg per pt    Objective:   Physical Exam BP (!) 144/86   Pulse 61   Ht 6\' 2"  (1.88 m)   Wt 221 lb (100.2 kg)   SpO2 97%   BMI 28.37 kg/m  VS noted,  Constitutional: Pt appears in NAD HENT: Head: NCAT.  Right Ear: External ear normal.  Left Ear: External ear normal.  Eyes: . Pupils are equal, round, and reactive to light. Conjunctivae and EOM are normal Nose: without d/c or deformity Neck: Neck supple. Gross normal ROM Cardiovascular: Normal rate and regular rhythm.   Pulmonary/Chest: Effort normal and breath sounds without rales or wheezing.  Abd:  Soft, NT, ND, + BS, no organomegaly Neurological:  Pt is alert. At baseline orientation, motor grossly intact Skin: Skin is warm. + 6 x6 cm oval areas to bilat flank area only erythem rash - nontender, nonvesicular, non raised, has several pinpoint erythema lesions nontender to crown but no other new lesions, no LE edema Psychiatric: Pt behavior is normal without agitation  No other exam findings     Assessment & Plan:

## 2017-04-30 NOTE — Assessment & Plan Note (Signed)
Mild elevated likely situational, o/w stable overall by history and exam, recent data reviewed with pt, and pt to continue medical treatment as before,  to f/u any worsening symptoms or concerns BP Readings from Last 3 Encounters:  04/30/17 (!) 144/86  04/08/17 (!) 154/64  03/16/17 130/62

## 2017-04-30 NOTE — Patient Instructions (Signed)
You had the steroid shot today  Please take all new medication as prescribed - the prednisone  Please take all new medication as prescribed - the cream if tries to re-occur  Please continue all other medications as before, including the eliquis  Please have the pharmacy call with any other refills you may need.  Please keep your appointments with your specialists as you may have planned

## 2017-04-30 NOTE — Assessment & Plan Note (Signed)
unsual but likely allergic type rash that responded to steroid tx in the past; unlikely to be med related - to cont all meds including the eliquis, also for depomedrol IM 80, predpac asd, steroid topical cr prn, benadryl cream prn,  to f/u any worsening symptoms or concerns

## 2017-05-03 ENCOUNTER — Ambulatory Visit (INDEPENDENT_AMBULATORY_CARE_PROVIDER_SITE_OTHER): Payer: Medicare Other | Admitting: Orthotics

## 2017-05-03 ENCOUNTER — Telehealth: Payer: Self-pay | Admitting: Internal Medicine

## 2017-05-03 DIAGNOSIS — R0989 Other specified symptoms and signs involving the circulatory and respiratory systems: Secondary | ICD-10-CM

## 2017-05-03 DIAGNOSIS — E1140 Type 2 diabetes mellitus with diabetic neuropathy, unspecified: Secondary | ICD-10-CM

## 2017-05-03 DIAGNOSIS — M216X1 Other acquired deformities of right foot: Secondary | ICD-10-CM

## 2017-05-03 DIAGNOSIS — E1142 Type 2 diabetes mellitus with diabetic polyneuropathy: Secondary | ICD-10-CM

## 2017-05-03 DIAGNOSIS — L84 Corns and callosities: Secondary | ICD-10-CM | POA: Diagnosis not present

## 2017-05-03 MED ORDER — GLIPIZIDE 5 MG PO TABS: 5.0000 mg | ORAL_TABLET | Freq: Two times a day (BID) | ORAL | 0 refills | Status: DC

## 2017-05-03 NOTE — Progress Notes (Signed)
Patient came in today to pick up diabetic shoes and custom inserts.  Same was well pleased with fit and function.   The foot ortheses offered full contact with plantar surface and contoured the arch well.   The shoes fit well with no heel slippage and areas of pressure concern.   Patient advised to contact us if any problems arise.

## 2017-05-03 NOTE — Telephone Encounter (Signed)
Routing to dr burns, can you please advise in dr Gwynn Burly absence, thanks

## 2017-05-03 NOTE — Telephone Encounter (Signed)
Sugars will be elevated while on prednisone and for a short time after.  Continue metformin.  Start glipizide - this will be temporary.  Take as directed and monitor sugars closely.  He will be able to stop the glipizide most likely a few days after he stops the prednisone - stop once sugars come back to his baseline.

## 2017-05-03 NOTE — Telephone Encounter (Signed)
Advised patient to pick up rx sent to [facility] and add that along with  his current metformin dosage until his sugars return to his normal---patient repeated back for understanding

## 2017-05-03 NOTE — Telephone Encounter (Signed)
Pt contacted the office stating that his blood sugar was 191 after taking the steroid. Does he need to do anything about this or what would you suggest? Please advise.

## 2017-05-13 NOTE — Progress Notes (Signed)
Cardiology Office Note    Date:  05/17/2017   ID:  Cary, Lothrop 22-Aug-1941, MRN [id number]  PCP:  [name], MD  Cardiologist:  Dr. Burt Knack  Chief Complaint: Exertional fatigue  History of Present Illness:   Thomas Marshall is a 76 y.o. male with hx of CAD status post CABG in 2004, peripheral arterial disease with history of carotid endarterectomy, PAF, HTN, HLD, CKD stage III, small AAA and  bradycardia (on metoprolol and coreg) presents for fatigue.   Last echocardiogram 04/2016 showed LV function of 55-60%, mild LVH, grade 2 diastolic dysfunction, bilateral atrial enlargement, mildly dilated aortic root, PA peak pressure of 29 mmHg. Unfortunately he has a lot of medication intolerances.  He was doing well on cardiac stand point when last seen by Dr. Burt Knack 02/12/17.  Patient having exertional dyspnea for the past few months. Relieved with rest. He is not able to do any outside activity. Not particularly any chest pain or shortness of breath. However, symptoms is similar to prior angina when he had CABG. No orthopnea, PND, syncope, dizziness, lower extremity edema or melena. He bruises easily. Systolic blood pressure runs in 1:30 to 160 at home.  Past Medical History:  Diagnosis Date  . AAA (abdominal aortic aneurysm) ([place])   . Arthritis   . Cancer (HCC)    skin & squamous cell  . Carotid artery disease ([place])    a. s/p R CEA 12/2015.  [name] CKD (chronic kidney disease), stage III    a. per historical labs.  . Coronary artery disease    a. s/p CABG 2004.  . Diabetes mellitus without complication ([place])   . GERD (gastroesophageal reflux disease)   . Hyperlipidemia   . Hypertension   . Medication intolerance Multiple   . PAF (paroxysmal atrial fibrillation) ([place])   . Pneumonia   . Sinus bradycardia    a. baseline HR 50s-60s, also h/o bradycardia on metoprolol and carvedilol    Past Surgical History:  Procedure Laterality Date  . ANKLE SURGERY     right fused  . BACK  SURGERY     low back   . CHOLECYSTECTOMY N/A 12/07/2013   Procedure: LAPAROSCOPIC CHOLECYSTECTOMY ;  Surgeon: [name], MD;  Location: [facility];  Service: General;  Laterality: N/A;  . COLONOSCOPY    . CORONARY ARTERY BYPASS GRAFT  2004   [place]  . ENDARTERECTOMY Right 12/10/2015   Procedure: Right Carotid ENDARTERECTOMY;  Surgeon: Conrad Novice, MD;  Location: [facility];  Service: Vascular;  Laterality: Right;  . EXPLORATION POST OPERATIVE OPEN HEART    . FINGER SURGERY     skin graft on rt index  . FINGER SURGERY Right    right index finger.  [name] HERNIA REPAIR Right    RIH  . JOINT REPLACEMENT    . KNEE SURGERY     replacement on both knees  . PROSTATE SURGERY    . TONSILLECTOMY    . URINARY SURGERY     scar tissue    Current Medications:  Prior to Admission medications   Medication Sig Start Date End Date Taking? Authorizing Provider  amLODipine (NORVASC) 10 MG tablet Take 1 tablet (10 mg total) by mouth daily. 01/19/17  Yes [name], MD  apixaban (ELIQUIS) 5 MG TABS tablet Take 5 mg by mouth 2 (two) times daily.   Yes [provider]  aspirin 81 MG tablet Take 81 mg by mouth daily.   Yes [provider]  atorvastatin (LIPITOR) 40 MG tablet Take 40 mg by mouth daily.   Yes [provider]  cholecalciferol (VITAMIN D) 1000 UNITS tablet Take 1,000 Units by mouth daily.   Yes [provider]  cloNIDine (CATAPRES) 0.1 MG tablet TAKE ONE TABLET BY MOUTH TWICE DAILY 10/15/16  Yes [name], MD  dofetilide (TIKOSYN) 250 MCG capsule Take 1 capsule (250 mcg total) by mouth 2 (two) times daily. 09/30/16  Yes Reino Bellis B, NP  glucose blood test strip Use to test blood glucose level 2 times a day. 01/24/16  Yes Bacigalupo, Dionne Bucy, MD  magnesium oxide (MAG-OX) 400 MG tablet Take 1 tablet (400 mg total) by mouth 4 (four) times daily. 12/03/16  Yes [name], MD  metFORMIN (GLUCOPHAGE) 1000 MG tablet Take 1 tablet (1,000 mg total)  by mouth 2 (two) times daily with a meal. 09/16/16  Yes [name], MD  nitroGLYCERIN (NITROSTAT) 0.4 MG SL tablet Place 1 tablet (0.4 mg total) under the tongue every 5 (five) minutes as needed for chest pain (x 3 doses). 05/22/16  Yes Bacigalupo, Dionne Bucy, MD  omeprazole (PRILOSEC) 20 MG capsule Take 20 mg by mouth daily.   Yes [provider]  terazosin (HYTRIN) 1 MG capsule Take 1 mg by mouth at bedtime. 01/07/17  Yes [name], MD  triamcinolone cream (KENALOG) 0.1 % Apply 1 application topically 2 (two) times daily. 04/30/17 04/30/18 Yes [name], MD  vitamin C (ASCORBIC ACID) 500 MG tablet Take 500 mg by mouth daily.   Yes [provider]    Allergies:   Lisinopril; Losartan; Nifedipine; Triamterene; Hydralazine hcl; Amoxicillin; Ampicillin; and Carvedilol   Social History   Social History  . Marital status: Married    Spouse name: N/A  . Number of children: N/A  . Years of education: N/A   Social History Main Topics  . Smoking status: Former Smoker    Packs/day: 1.00    Years: 13.00    Quit date: 11/30/1970  . Smokeless tobacco: Never Used  . Alcohol use No  . Drug use: No  . Sexual activity: Yes   Other Topics Concern  . None   Social History Narrative  . None     Family History:  The patient's family history includes Cancer in his sister; Diabetes in his brother and sister; Heart disease in his brother, father, and mother; Hyperlipidemia in his brother and father; Hypertension in his brother, father, and sister; Stroke in his mother.   ROS:   Please see the history of present illness.    ROS All other systems reviewed and are negative.   PHYSICAL EXAM:   VS:  BP (!) 146/62   Pulse (!) 57   Ht 6\' 2"  (1.88 m)   Wt 222 lb (100.7 kg)   BMI 28.50 kg/m    GEN: Well nourished, well developed, in no acute distress  HEENT: normal  Neck: no JVD, carotid bruits, or masses Cardiac: RRR; no murmurs, rubs, or gallops,no edema  Respiratory:   clear to auscultation bilaterally, normal work of breathing GI: soft, nontender, nondistended, + BS MS: no deformity or atrophy  Skin: warm and dry, no rash Neuro:  Alert and Oriented x 3, Strength and sensation are intact Psych: euthymic mood, full affect  Wt Readings from Last 3 Encounters:  05/17/17 222 lb (100.7 kg)  04/30/17 221 lb (100.2 kg)  04/08/17 226 lb (102.5 kg)      Studies/Labs Reviewed:   EKG:  EKG is ordered today.  The ekg ordered today demonstrates Sinus bradycardia at rate of 57 bpm and PACs.  Recent Labs: 10/26/2016: Magnesium 1.9 03/16/2017: ALT 13; BUN 25; Creatinine, Ser 1.48; Hemoglobin 12.5; Platelets 139.0; Potassium 4.8; Sodium 141; TSH 3.47   Lipid Panel    Component Value Date/Time   CHOL 107 03/16/2017 0953   TRIG 68.0 03/16/2017 0953   HDL 37.70 (L) 03/16/2017 0953   CHOLHDL 3 03/16/2017 0953   VLDL 13.6 03/16/2017 0953   LDLCALC 56 03/16/2017 0953   LDLDIRECT 67.0 09/16/2016 1455    Additional studies/ records that were reviewed today include:   As above    ASSESSMENT & PLAN:    1. CAD s/p CABG in2004 - His symptoms is concerning for angina. After discussion with Dr. Burt Knack will do cardiac catheterization for definite evaluation of coronary anatomy. Continue ASA and statin.   2. PAF - Maintaining sinus rhythm on Tikosyn. Continue Eliquis.   3.Hypertensive heart disease - Elevated blood pressure. Repeat check 170/84. He is allergic to many antihypertensive. Continue Norvasc 10 mg qd. Might consider adding another agent if still elevated during cath.  4. HLD - Continue Lipitor 40 mg. 03/16/2017: Cholesterol 107; HDL 37.70; LDL Cholesterol 56; Triglycerides 68.0; VLDL 13.6  5. DM - Was not able to tolerate glipizide. Currently only on metformin. Last A1c 7.1.  6. Carotid artery stenosis status post right carotid endarterectomy - Last carotid Doppler 10/2016 showed patent right endarterectomy with 40-59% stenosis on the LICA.    - Followed by Dr. Bridgett Larsson  7. Chronic diastolic CHF - Euvolemic.   Medication Adjustments/Labs and Tests Ordered: Current medicines are reviewed at length with the patient today.  Concerns regarding medicines are outlined above.  Medication changes, Labs and Tests ordered today are listed in the Patient Instructions below. Patient Instructions  Medication Instructions:   Your physician recommends that you continue on your current medications as directed. Please refer to the Current Medication list given to you today.   If you need a refill on your cardiac medications before your next appointment, please call your pharmacy.   Labwork:  CBC PT/INR  BMET AND MAG    Testing/Procedures:  LETTER FOR  L. HEART CATH    Follow-Up:  BASED UPON RESULTS ON CATH    Any Other Special Instructions Will Be Listed Below (If Applicable).                                                                                                                                                      Jarrett Soho, [place]  05/17/2017 4:02 PM    [facility] [address], [place], [place]  [address] Phone: [phone]; Fax: [phone]

## 2017-05-17 ENCOUNTER — Encounter: Payer: Self-pay | Admitting: *Deleted

## 2017-05-17 ENCOUNTER — Ambulatory Visit (INDEPENDENT_AMBULATORY_CARE_PROVIDER_SITE_OTHER): Payer: Medicare Other | Admitting: Physician Assistant

## 2017-05-17 ENCOUNTER — Encounter: Payer: Self-pay | Admitting: Physician Assistant

## 2017-05-17 VITALS — BP 146/62 | HR 57 | Ht 74.0 in | Wt 222.0 lb

## 2017-05-17 DIAGNOSIS — I209 Angina pectoris, unspecified: Secondary | ICD-10-CM

## 2017-05-17 DIAGNOSIS — I779 Disorder of arteries and arterioles, unspecified: Secondary | ICD-10-CM

## 2017-05-17 DIAGNOSIS — I10 Essential (primary) hypertension: Secondary | ICD-10-CM | POA: Diagnosis not present

## 2017-05-17 DIAGNOSIS — I119 Hypertensive heart disease without heart failure: Secondary | ICD-10-CM

## 2017-05-17 DIAGNOSIS — E118 Type 2 diabetes mellitus with unspecified complications: Secondary | ICD-10-CM | POA: Diagnosis not present

## 2017-05-17 DIAGNOSIS — I25708 Atherosclerosis of coronary artery bypass graft(s), unspecified, with other forms of angina pectoris: Secondary | ICD-10-CM

## 2017-05-17 DIAGNOSIS — I5032 Chronic diastolic (congestive) heart failure: Secondary | ICD-10-CM | POA: Diagnosis not present

## 2017-05-17 DIAGNOSIS — I480 Paroxysmal atrial fibrillation: Secondary | ICD-10-CM

## 2017-05-17 DIAGNOSIS — E785 Hyperlipidemia, unspecified: Secondary | ICD-10-CM | POA: Diagnosis not present

## 2017-05-17 DIAGNOSIS — I739 Peripheral vascular disease, unspecified: Secondary | ICD-10-CM

## 2017-05-17 DIAGNOSIS — I130 Hypertensive heart and chronic kidney disease with heart failure and stage 1 through stage 4 chronic kidney disease, or unspecified chronic kidney disease: Secondary | ICD-10-CM | POA: Diagnosis not present

## 2017-05-17 NOTE — Addendum Note (Signed)
Addended by: Claude Manges on: 05/17/2017 04:31 PM   Modules accepted: Orders

## 2017-05-17 NOTE — Patient Instructions (Signed)
Medication Instructions:   Your physician recommends that you continue on your current medications as directed. Please refer to the Current Medication list given to you today.   If you need a refill on your cardiac medications before your next appointment, please call your pharmacy.   Labwork:  CBC PT/INR  BMET AND MAG    Testing/Procedures:  LETTER FOR  L. HEART CATH    Follow-Up:  BASED UPON RESULTS ON CATH    Any Other Special Instructions Will Be Listed Below (If Applicable).

## 2017-05-18 ENCOUNTER — Encounter: Payer: Self-pay | Admitting: Internal Medicine

## 2017-05-18 ENCOUNTER — Ambulatory Visit (INDEPENDENT_AMBULATORY_CARE_PROVIDER_SITE_OTHER): Payer: Medicare Other | Admitting: Internal Medicine

## 2017-05-18 ENCOUNTER — Telehealth: Payer: Self-pay | Admitting: Physician Assistant

## 2017-05-18 VITALS — BP 142/78 | HR 64 | Ht 74.0 in | Wt 220.0 lb

## 2017-05-18 DIAGNOSIS — E1122 Type 2 diabetes mellitus with diabetic chronic kidney disease: Secondary | ICD-10-CM

## 2017-05-18 DIAGNOSIS — N182 Chronic kidney disease, stage 2 (mild): Secondary | ICD-10-CM | POA: Diagnosis not present

## 2017-05-18 DIAGNOSIS — M545 Low back pain, unspecified: Secondary | ICD-10-CM | POA: Insufficient documentation

## 2017-05-18 DIAGNOSIS — I10 Essential (primary) hypertension: Secondary | ICD-10-CM

## 2017-05-18 DIAGNOSIS — I209 Angina pectoris, unspecified: Secondary | ICD-10-CM | POA: Diagnosis not present

## 2017-05-18 LAB — BASIC METABOLIC PANEL
BUN/Creatinine Ratio: 17 (ref 10–24)
BUN: 26 mg/dL (ref 8–27)
CALCIUM: 9.2 mg/dL (ref 8.6–10.2)
CHLORIDE: 99 mmol/L (ref 96–106)
CO2: 27 mmol/L (ref 20–29)
Creatinine, Ser: 1.57 mg/dL — ABNORMAL HIGH (ref 0.76–1.27)
GFR, EST AFRICAN AMERICAN: 48 mL/min/{1.73_m2} — AB (ref >59)
GFR, EST NON AFRICAN AMERICAN: 42 mL/min/{1.73_m2} — AB (ref >59)
Glucose: 164 mg/dL — ABNORMAL HIGH (ref 65–99)
Potassium: 4.5 mmol/L (ref 3.5–5.2)
Sodium: 140 mmol/L (ref 134–144)

## 2017-05-18 LAB — CBC
HEMATOCRIT: 37.2 % — AB (ref 37.5–51.0)
HEMOGLOBIN: 11.9 g/dL — AB (ref 13.0–17.7)
MCH: 29.9 pg (ref 26.6–33.0)
MCHC: 32 g/dL (ref 31.5–35.7)
MCV: 94 fL (ref 79–97)
Platelets: 119 10*3/uL — ABNORMAL LOW (ref 150–379)
RBC: 3.98 x10E6/uL — ABNORMAL LOW (ref 4.14–5.80)
RDW: 15.3 % (ref 12.3–15.4)
WBC: 5.8 10*3/uL (ref 3.4–10.8)

## 2017-05-18 LAB — PROTIME-INR
INR: 1 (ref 0.8–1.2)
PROTHROMBIN TIME: 10.6 s (ref 9.1–12.0)

## 2017-05-18 LAB — MAGNESIUM: MAGNESIUM: 1.9 mg/dL (ref 1.6–2.3)

## 2017-05-18 LAB — PRO B NATRIURETIC PEPTIDE: NT-PRO BNP: 277 pg/mL (ref 0–486)

## 2017-05-18 NOTE — Assessment & Plan Note (Signed)
6 wks onset, now chronic intermittent with severe debilitating episodes lasting 1-2 days and cant walk straight up; Does not want pain med, declines muscle relaxer, for LS Spine MRI - ? Right facet syndrome? Which might then be amenable to ESI, consider ortho eval

## 2017-05-18 NOTE — Progress Notes (Signed)
Subjective:    Patient ID: Thomas Marshall, male    DOB: 07/29/1941, 76 y.o.   MRN: [id number]  HPI  Here with acute onset 1.5 mo right lower back pain (not midline), intermittent but occasionally severe, last 2 days were worse;  Pt denies bowel or bladder change, fever, wt loss,  worsening LE pain/numbness/weakness, gait change or falls. Nothing else makes better or worse.  No heavy lfiting or activity changes.  Pt denies fever, wt loss, night sweats, loss of appetite, or other constitutional symptoms  S/p disc surgury in 1981 but no other prior significant lower back disease.  No recent imagine or other eval/tx since then. Saw Dr York Cerise PA yesterday, due for heart cath Friday  Pt denies polydipsia, polyuria,  Past Medical History:  Diagnosis Date  . AAA (abdominal aortic aneurysm) ([place])   . Arthritis   . Cancer (HCC)    skin & squamous cell  . Carotid artery disease ([place])    a. s/p R CEA 12/2015.  [name] CKD (chronic kidney disease), stage III    a. per historical labs.  . Coronary artery disease    a. s/p CABG 2004.  . Diabetes mellitus without complication ([place])   . GERD (gastroesophageal reflux disease)   . Hyperlipidemia   . Hypertension   . Medication intolerance Multiple   . PAF (paroxysmal atrial fibrillation) ([place])   . Pneumonia   . Sinus bradycardia    a. baseline HR 50s-60s, also h/o bradycardia on metoprolol and carvedilol   Past Surgical History:  Procedure Laterality Date  . ANKLE SURGERY     right fused  . BACK SURGERY     low back   . CHOLECYSTECTOMY N/A 12/07/2013   Procedure: LAPAROSCOPIC CHOLECYSTECTOMY ;  Surgeon: [name], MD;  Location: [facility];  Service: General;  Laterality: N/A;  . COLONOSCOPY    . CORONARY ARTERY BYPASS GRAFT  2004   [place]  . ENDARTERECTOMY Right 12/10/2015   Procedure: Right Carotid ENDARTERECTOMY;  Surgeon: Conrad Clintwood, MD;  Location: [facility];  Service: Vascular;  Laterality: Right;  . EXPLORATION POST OPERATIVE OPEN HEART    .  FINGER SURGERY     skin graft on rt index  . FINGER SURGERY Right    right index finger.  [name] HERNIA REPAIR Right    RIH  . JOINT REPLACEMENT    . KNEE SURGERY     replacement on both knees  . PROSTATE SURGERY    . TONSILLECTOMY    . URINARY SURGERY     scar tissue    reports that he quit smoking about 46 years ago. He has a 13.00 pack-year smoking history. He has never used smokeless tobacco. He reports that he does not drink alcohol or use drugs. family history includes Cancer in his sister; Diabetes in his brother and sister; Heart disease in his brother, father, and mother; Hyperlipidemia in his brother and father; Hypertension in his brother, father, and sister; Stroke in his mother. Allergies  Allergen Reactions  . Lisinopril Swelling and Other (See Comments)    Angioedema.   . Losartan Swelling and Other (See Comments)    Lips swell Angioedema   . Nifedipine Swelling    Sugar increase  . Triamterene Swelling  . Hydralazine Hcl Other (See Comments)    Fatigue; poor appetite  . Amoxicillin Rash  . Ampicillin Rash  . Carvedilol Other (See Comments)    Low heart rate    Current Outpatient Prescriptions  on File Prior to Visit  Medication Sig Dispense Refill  . amLODipine (NORVASC) 10 MG tablet Take 1 tablet (10 mg total) by mouth daily. 30 tablet 11  . apixaban (ELIQUIS) 5 MG TABS tablet Take 5 mg by mouth 2 (two) times daily.    [name] aspirin 81 MG tablet Take 81 mg by mouth daily.    [name] atorvastatin (LIPITOR) 40 MG tablet Take 40 mg by mouth daily.    . cholecalciferol (VITAMIN D) 1000 UNITS tablet Take 1,000 Units by mouth daily.    . cloNIDine (CATAPRES) 0.1 MG tablet TAKE ONE TABLET BY MOUTH TWICE DAILY 180 tablet 2  . dofetilide (TIKOSYN) 250 MCG capsule Take 1 capsule (250 mcg total) by mouth 2 (two) times daily. 60 capsule 6  . glucose blood test strip Use to test blood glucose level 2 times a day. 100 each 12  . magnesium oxide (MAG-OX) 400 MG tablet Take 1 tablet  (400 mg total) by mouth 4 (four) times daily. 360 tablet 3  . metFORMIN (GLUCOPHAGE) 1000 MG tablet Take 1 tablet (1,000 mg total) by mouth 2 (two) times daily with a meal. 180 tablet 3  . nitroGLYCERIN (NITROSTAT) 0.4 MG SL tablet Place 1 tablet (0.4 mg total) under the tongue every 5 (five) minutes as needed for chest pain (x 3 doses). 30 tablet 3  . omeprazole (PRILOSEC) 20 MG capsule Take 20 mg by mouth daily.    [name] terazosin (HYTRIN) 1 MG capsule Take 1 mg by mouth at bedtime. 30 capsule 11  . triamcinolone cream (KENALOG) 0.1 % Apply 1 application topically 2 (two) times daily. 30 g 1  . vitamin C (ASCORBIC ACID) 500 MG tablet Take 500 mg by mouth daily.     No current facility-administered medications on file prior to visit.    Review of Systems  Constitutional: Negative for other unusual diaphoresis or sweats HENT: Negative for ear discharge or swelling Eyes: Negative for other worsening visual disturbances Respiratory: Negative for stridor or other swelling  Gastrointestinal: Negative for worsening distension or other blood Genitourinary: Negative for retention or other urinary change Musculoskeletal: Negative for other MSK pain or swelling Skin: Negative for color change or other new lesions Neurological: Negative for worsening tremors and other numbness  Psychiatric/Behavioral: Negative for worsening agitation or other fatigue All other system neg per pt    Objective:   Physical Exam BP (!) 142/78   Pulse 64   Ht 6\' 2"  (1.88 m)   Wt 220 lb (99.8 kg)   SpO2 100%   BMI 28.25 kg/m  VS noted, not ill appearing Constitutional: Pt appears in NAD HENT: Head: NCAT.  Right Ear: External ear normal.  Left Ear: External ear normal.  Eyes: . Pupils are equal, round, and reactive to light. Conjunctivae and EOM are normal Nose: without d/c or deformity Neck: Neck supple. Gross normal ROM Cardiovascular: Normal rate and regular rhythm.   Pulmonary/Chest: Effort normal and breath  sounds without rales or wheezing.  Abd:  Soft, NT, ND, + BS, no organomegaly Spine Nontender, but has right lumbar paravertebral tender Neurological: Pt is alert. At baseline orientation, motor 5/5 intact throughout; has diminished reflexes bilat patellar s/p bilat knee TKA, sens intact to LT throughout Skin: Skin is warm. No rashes, other new lesions, no LE edema Psychiatric: Pt behavior is normal without agitation  No other exam findings    Assessment & Plan:

## 2017-05-18 NOTE — Assessment & Plan Note (Signed)
elev to 170 yesterday, only mild high today, to cont meds as is, f/u cardiology with cath on friday

## 2017-05-18 NOTE — Telephone Encounter (Signed)
Walk In pt Form-questions about meds. Please call. Placed in [employer]

## 2017-05-18 NOTE — Assessment & Plan Note (Signed)
Lab Results  Component Value Date   HGBA1C 7.1 (H) 03/16/2017  stable overall by history and exam, recent data reviewed with pt, and pt to continue medical treatment as before,  to f/u any worsening symptoms or concerns

## 2017-05-18 NOTE — Patient Instructions (Signed)
Please continue all other medications as before, and refills have been done if requested.  Please have the pharmacy call with any other refills you may need.  Please continue your efforts at being more active, low cholesterol diet, and weight control.  Please keep your appointments with your specialists as you may have planned - Cath on Friday  You will be contacted regarding the referral for: MRI lower back

## 2017-05-20 ENCOUNTER — Telehealth: Payer: Self-pay

## 2017-05-20 NOTE — Telephone Encounter (Signed)
Patient wife (DPR on file) contacted pre-catheterization at [facility] scheduled for: 05/21/2017 @ 1200 Verified arrival time and place:  NT @ 1000-wife indicates understanding Confirmed AM meds to be taken pre-cath with sip of water:  Notified wife that Pt should NOT take Metformin today or tomorrow.  Confirmed Pt has been holding Eliquis.  Notified wife that Pt should take ASA 81 mg with AM meds Confirmed patient has responsible person to drive home post procedure and observe patient for 24 hours: yes-wife Addl concerns:  None expressed.  Gave this nurse name and # if any further questions.

## 2017-05-21 ENCOUNTER — Ambulatory Visit (HOSPITAL_COMMUNITY)
Admission: RE | Admit: 2017-05-21 | Discharge: 2017-05-21 | Disposition: A | Discharge status: Home / Self Care | Payer: Medicare Other | Source: Ambulatory Visit | Attending: Cardiovascular Disease | Admitting: Cardiovascular Disease

## 2017-05-21 ENCOUNTER — Telehealth: Payer: Self-pay | Admitting: Physician Assistant

## 2017-05-21 ENCOUNTER — Ambulatory Visit (HOSPITAL_COMMUNITY)
Admission: RE | Disposition: A | Discharge status: Home / Self Care | Payer: Self-pay | Source: Ambulatory Visit | Attending: Cardiovascular Disease

## 2017-05-21 DIAGNOSIS — E1151 Type 2 diabetes mellitus with diabetic peripheral angiopathy without gangrene: Secondary | ICD-10-CM | POA: Insufficient documentation

## 2017-05-21 DIAGNOSIS — I208 Other forms of angina pectoris: Secondary | ICD-10-CM

## 2017-05-21 DIAGNOSIS — I2089 Other forms of angina pectoris: Secondary | ICD-10-CM

## 2017-05-21 DIAGNOSIS — Z7902 Long term (current) use of antithrombotics/antiplatelets: Secondary | ICD-10-CM | POA: Insufficient documentation

## 2017-05-21 DIAGNOSIS — I714 Abdominal aortic aneurysm, without rupture, unspecified: Secondary | ICD-10-CM | POA: Diagnosis present

## 2017-05-21 DIAGNOSIS — I481 Persistent atrial fibrillation: Secondary | ICD-10-CM | POA: Diagnosis not present

## 2017-05-21 DIAGNOSIS — I2582 Chronic total occlusion of coronary artery: Secondary | ICD-10-CM | POA: Insufficient documentation

## 2017-05-21 DIAGNOSIS — I5032 Chronic diastolic (congestive) heart failure: Secondary | ICD-10-CM | POA: Diagnosis not present

## 2017-05-21 DIAGNOSIS — Z7984 Long term (current) use of oral hypoglycemic drugs: Secondary | ICD-10-CM | POA: Insufficient documentation

## 2017-05-21 DIAGNOSIS — Z87891 Personal history of nicotine dependence: Secondary | ICD-10-CM | POA: Insufficient documentation

## 2017-05-21 DIAGNOSIS — E785 Hyperlipidemia, unspecified: Secondary | ICD-10-CM | POA: Diagnosis not present

## 2017-05-21 DIAGNOSIS — N183 Chronic kidney disease, stage 3 unspecified: Secondary | ICD-10-CM | POA: Diagnosis present

## 2017-05-21 DIAGNOSIS — E1122 Type 2 diabetes mellitus with diabetic chronic kidney disease: Secondary | ICD-10-CM | POA: Diagnosis not present

## 2017-05-21 DIAGNOSIS — I771 Stricture of artery: Secondary | ICD-10-CM | POA: Diagnosis not present

## 2017-05-21 DIAGNOSIS — Z96653 Presence of artificial knee joint, bilateral: Secondary | ICD-10-CM | POA: Diagnosis not present

## 2017-05-21 DIAGNOSIS — K219 Gastro-esophageal reflux disease without esophagitis: Secondary | ICD-10-CM | POA: Diagnosis present

## 2017-05-21 DIAGNOSIS — I25118 Atherosclerotic heart disease of native coronary artery with other forms of angina pectoris: Secondary | ICD-10-CM | POA: Diagnosis not present

## 2017-05-21 DIAGNOSIS — I10 Essential (primary) hypertension: Secondary | ICD-10-CM | POA: Diagnosis present

## 2017-05-21 DIAGNOSIS — Z7982 Long term (current) use of aspirin: Secondary | ICD-10-CM | POA: Diagnosis not present

## 2017-05-21 DIAGNOSIS — Z85828 Personal history of other malignant neoplasm of skin: Secondary | ICD-10-CM | POA: Diagnosis not present

## 2017-05-21 DIAGNOSIS — I4819 Other persistent atrial fibrillation: Secondary | ICD-10-CM | POA: Diagnosis present

## 2017-05-21 DIAGNOSIS — I25718 Atherosclerosis of autologous vein coronary artery bypass graft(s) with other forms of angina pectoris: Secondary | ICD-10-CM | POA: Diagnosis not present

## 2017-05-21 DIAGNOSIS — Z888 Allergy status to other drugs, medicaments and biological substances status: Secondary | ICD-10-CM | POA: Insufficient documentation

## 2017-05-21 DIAGNOSIS — Z951 Presence of aortocoronary bypass graft: Secondary | ICD-10-CM | POA: Diagnosis not present

## 2017-05-21 DIAGNOSIS — Z880 Allergy status to penicillin: Secondary | ICD-10-CM | POA: Diagnosis not present

## 2017-05-21 DIAGNOSIS — I130 Hypertensive heart and chronic kidney disease with heart failure and stage 1 through stage 4 chronic kidney disease, or unspecified chronic kidney disease: Secondary | ICD-10-CM | POA: Insufficient documentation

## 2017-05-21 DIAGNOSIS — E1129 Type 2 diabetes mellitus with other diabetic kidney complication: Secondary | ICD-10-CM | POA: Diagnosis present

## 2017-05-21 DIAGNOSIS — I779 Disorder of arteries and arterioles, unspecified: Secondary | ICD-10-CM | POA: Diagnosis present

## 2017-05-21 DIAGNOSIS — I480 Paroxysmal atrial fibrillation: Secondary | ICD-10-CM | POA: Diagnosis not present

## 2017-05-21 DIAGNOSIS — R001 Bradycardia, unspecified: Secondary | ICD-10-CM | POA: Diagnosis not present

## 2017-05-21 DIAGNOSIS — Z955 Presence of coronary angioplasty implant and graft: Secondary | ICD-10-CM

## 2017-05-21 DIAGNOSIS — I739 Peripheral vascular disease, unspecified: Secondary | ICD-10-CM

## 2017-05-21 DIAGNOSIS — N400 Enlarged prostate without lower urinary tract symptoms: Secondary | ICD-10-CM | POA: Diagnosis not present

## 2017-05-21 DIAGNOSIS — I2510 Atherosclerotic heart disease of native coronary artery without angina pectoris: Secondary | ICD-10-CM | POA: Diagnosis present

## 2017-05-21 DIAGNOSIS — I6522 Occlusion and stenosis of left carotid artery: Secondary | ICD-10-CM | POA: Insufficient documentation

## 2017-05-21 DIAGNOSIS — Z7901 Long term (current) use of anticoagulants: Secondary | ICD-10-CM | POA: Insufficient documentation

## 2017-05-21 HISTORY — PX: LEFT HEART CATH AND CORS/GRAFTS ANGIOGRAPHY: CATH118250

## 2017-05-21 HISTORY — PX: CORONARY STENT INTERVENTION: CATH118234

## 2017-05-21 LAB — GLUCOSE, CAPILLARY
Glucose-Capillary: 118 mg/dL — ABNORMAL HIGH (ref 65–99)
Glucose-Capillary: 117 mg/dL — ABNORMAL HIGH (ref 65–99)

## 2017-05-21 LAB — POCT ACTIVATED CLOTTING TIME: ACTIVATED CLOTTING TIME: 367 s

## 2017-05-21 SURGERY — LEFT HEART CATH AND CORS/GRAFTS ANGIOGRAPHY
Anesthesia: LOCAL

## 2017-05-21 MED ORDER — MIDAZOLAM HCL 2 MG/2ML IJ SOLN
INTRAMUSCULAR | Status: AC
  Filled 2017-05-21: qty 2

## 2017-05-21 MED ORDER — SODIUM CHLORIDE 0.9 % IV SOLN: 250.0000 mL | INTRAVENOUS | Status: DC | PRN

## 2017-05-21 MED ORDER — MIDAZOLAM HCL 2 MG/2ML IJ SOLN
INTRAMUSCULAR | Status: DC | PRN
  Administered 2017-05-21: 2 mg via INTRAVENOUS
  Administered 2017-05-21: 1 mg via INTRAVENOUS

## 2017-05-21 MED ORDER — HEPARIN (PORCINE) IN NACL 2-0.9 UNIT/ML-% IJ SOLN
INTRAMUSCULAR | Status: AC
  Filled 2017-05-21: qty 1000

## 2017-05-21 MED ORDER — AMLODIPINE BESYLATE 10 MG PO TABS: 10.0000 mg | ORAL_TABLET | Freq: Every day | ORAL | Status: DC

## 2017-05-21 MED ORDER — FENTANYL CITRATE (PF) 100 MCG/2ML IJ SOLN
INTRAMUSCULAR | Status: DC | PRN
  Administered 2017-05-21 (×2): 25 ug via INTRAVENOUS

## 2017-05-21 MED ORDER — CLOPIDOGREL BISULFATE 300 MG PO TABS
ORAL_TABLET | ORAL | Status: DC | PRN
  Administered 2017-05-21: 600 mg via ORAL

## 2017-05-21 MED ORDER — IOPAMIDOL (ISOVUE-370) INJECTION 76%
INTRAVENOUS | Status: AC
  Filled 2017-05-21: qty 50

## 2017-05-21 MED ORDER — SODIUM CHLORIDE 0.9% FLUSH: 3.0000 mL | INTRAVENOUS | Status: DC | PRN

## 2017-05-21 MED ORDER — BIVALIRUDIN TRIFLUOROACETATE 250 MG IV SOLR
INTRAVENOUS | Status: AC
  Filled 2017-05-21: qty 250

## 2017-05-21 MED ORDER — ASPIRIN EC 81 MG PO TBEC: 81.0000 mg | DELAYED_RELEASE_TABLET | Freq: Every evening | ORAL | Status: DC

## 2017-05-21 MED ORDER — VITAMIN D3 25 MCG (1000 UNIT) PO TABS: 1000.0000 [IU] | ORAL_TABLET | Freq: Every evening | ORAL | Status: DC

## 2017-05-21 MED ORDER — IOPAMIDOL (ISOVUE-370) INJECTION 76%
INTRAVENOUS | Status: AC
  Filled 2017-05-21: qty 125

## 2017-05-21 MED ORDER — NITROGLYCERIN 0.4 MG SL SUBL: 0.4000 mg | SUBLINGUAL_TABLET | SUBLINGUAL | 3 refills | Status: DC | PRN

## 2017-05-21 MED ORDER — VERAPAMIL HCL 2.5 MG/ML IV SOLN
INTRAVENOUS | Status: AC
  Filled 2017-05-21: qty 2

## 2017-05-21 MED ORDER — SODIUM CHLORIDE 0.9 % WEIGHT BASED INFUSION: 1.0000 mL/kg/h | INTRAVENOUS | Status: DC

## 2017-05-21 MED ORDER — CLOPIDOGREL BISULFATE 75 MG PO TABS: 75.0000 mg | ORAL_TABLET | Freq: Every day | ORAL | 0 refills | Status: DC

## 2017-05-21 MED ORDER — TERAZOSIN HCL 1 MG PO CAPS: 1.0000 mg | ORAL_CAPSULE | Freq: Every day | ORAL | Status: DC

## 2017-05-21 MED ORDER — FENTANYL CITRATE (PF) 100 MCG/2ML IJ SOLN
INTRAMUSCULAR | Status: AC
  Filled 2017-05-21: qty 2

## 2017-05-21 MED ORDER — ASPIRIN EC 81 MG PO TBEC: 81.0000 mg | DELAYED_RELEASE_TABLET | Freq: Every day | ORAL | 0 refills | Status: DC

## 2017-05-21 MED ORDER — DOFETILIDE 250 MCG PO CAPS: 250.0000 ug | ORAL_CAPSULE | Freq: Two times a day (BID) | ORAL | Status: DC

## 2017-05-21 MED ORDER — VITAMIN C 500 MG PO TABS: 500.0000 mg | ORAL_TABLET | Freq: Every evening | ORAL | Status: DC

## 2017-05-21 MED ORDER — IOPAMIDOL (ISOVUE-370) INJECTION 76%
INTRAVENOUS | Status: DC | PRN
  Administered 2017-05-21: 100 mL via INTRA_ARTERIAL

## 2017-05-21 MED ORDER — ASPIRIN 81 MG PO CHEW: 81.0000 mg | CHEWABLE_TABLET | ORAL | Status: DC

## 2017-05-21 MED ORDER — CLOPIDOGREL BISULFATE 75 MG PO TABS: 75.0000 mg | ORAL_TABLET | Freq: Every day | ORAL | 3 refills | Status: DC

## 2017-05-21 MED ORDER — MAGNESIUM OXIDE 400 MG PO TABS: 800.0000 mg | ORAL_TABLET | Freq: Two times a day (BID) | ORAL | Status: DC

## 2017-05-21 MED ORDER — PANTOPRAZOLE SODIUM 40 MG PO TBEC: 40.0000 mg | DELAYED_RELEASE_TABLET | Freq: Every day | ORAL | 1 refills | Status: DC

## 2017-05-21 MED ORDER — HEPARIN (PORCINE) IN NACL 2-0.9 UNIT/ML-% IJ SOLN
INTRAMUSCULAR | Status: AC | PRN
  Administered 2017-05-21: 1000 mL

## 2017-05-21 MED ORDER — SODIUM CHLORIDE 0.9 % IV SOLN
INTRAVENOUS | Status: DC | PRN
  Administered 2017-05-21: 1.75 mg/kg/h via INTRAVENOUS

## 2017-05-21 MED ORDER — CLONIDINE HCL 0.1 MG PO TABS: 0.1000 mg | ORAL_TABLET | Freq: Two times a day (BID) | ORAL | Status: DC

## 2017-05-21 MED ORDER — SODIUM CHLORIDE 0.9% FLUSH: 3.0000 mL | Freq: Two times a day (BID) | INTRAVENOUS | Status: DC

## 2017-05-21 MED ORDER — CLOPIDOGREL BISULFATE 300 MG PO TABS
ORAL_TABLET | ORAL | Status: AC
  Filled 2017-05-21: qty 2

## 2017-05-21 MED ORDER — ACETAMINOPHEN 325 MG PO TABS: 650.0000 mg | ORAL_TABLET | ORAL | Status: DC | PRN

## 2017-05-21 MED ORDER — BIVALIRUDIN BOLUS VIA INFUSION - CUPID
INTRAVENOUS | Status: DC | PRN
  Administered 2017-05-21: 74.85 mg via INTRAVENOUS

## 2017-05-21 MED ORDER — APIXABAN 5 MG PO TABS: 5.0000 mg | ORAL_TABLET | Freq: Two times a day (BID) | ORAL | Status: DC

## 2017-05-21 MED ORDER — LIDOCAINE HCL (PF) 1 % IJ SOLN
INTRAMUSCULAR | Status: DC | PRN
  Administered 2017-05-21: 10 mL via SUBCUTANEOUS

## 2017-05-21 MED ORDER — ATORVASTATIN CALCIUM 40 MG PO TABS: 40.0000 mg | ORAL_TABLET | Freq: Every day | ORAL | Status: DC

## 2017-05-21 MED ORDER — VERAPAMIL HCL 2.5 MG/ML IV SOLN
INTRAVENOUS | Status: DC | PRN
  Administered 2017-05-21 (×2): 200 ug via INTRACORONARY
  Administered 2017-05-21: 100 ug via INTRACORONARY

## 2017-05-21 MED ORDER — ONDANSETRON HCL 4 MG/2ML IJ SOLN: 4.0000 mg | Freq: Four times a day (QID) | INTRAMUSCULAR | Status: DC | PRN

## 2017-05-21 MED ORDER — CLOPIDOGREL BISULFATE 75 MG PO TABS: 75.0000 mg | ORAL_TABLET | Freq: Every day | ORAL | Status: DC

## 2017-05-21 MED ORDER — LIDOCAINE HCL (PF) 1 % IJ SOLN
INTRAMUSCULAR | Status: AC
  Filled 2017-05-21: qty 30

## 2017-05-21 MED ORDER — SODIUM CHLORIDE 0.9 % WEIGHT BASED INFUSION
3.0000 mL/kg/h | INTRAVENOUS | Status: AC
  Administered 2017-05-21: 3 mL/kg/h via INTRAVENOUS

## 2017-05-21 MED FILL — CLOPIDOGREL 75 MG TABLET: 75 | 30 days supply | Qty: 30 | Fill #0

## 2017-05-21 SURGICAL SUPPLY — 24 items
BALLN SAPPHIRE 2.5X12 (BALLOONS) ×2
BALLOON SAPPHIRE 2.5X12 (BALLOONS) IMPLANT
CATH INFINITI 5FR MULTPACK ANG (CATHETERS) ×1 IMPLANT
CATH LAUNCHER 6FR AL1 (CATHETERS) IMPLANT
CATHETER LAUNCHER 6FR AL1 (CATHETERS) ×2
COVER PRB 48X5XTLSCP FOLD TPE (BAG) IMPLANT
COVER PROBE 5X48 (BAG) ×2
DEVICE CLOSURE PERCLS PRGLD 6F (VASCULAR PRODUCTS) IMPLANT
DEVICE SPIDERFX EMB PROT 5MM (WIRE) ×1 IMPLANT
GLIDESHEATH SLEND SS 6F .021 (SHEATH) IMPLANT
GUIDEWIRE INQWIRE 1.5J.035X260 (WIRE) IMPLANT
INQWIRE 1.5J .035X260CM (WIRE)
KIT ENCORE 26 ADVANTAGE (KITS) ×1 IMPLANT
KIT HEART LEFT (KITS) ×2 IMPLANT
PACK CARDIAC CATHETERIZATION (CUSTOM PROCEDURE TRAY) ×2 IMPLANT
PERCLOSE PROGLIDE 6F (VASCULAR PRODUCTS) ×2
SHEATH PINNACLE 5F 10CM (SHEATH) ×1 IMPLANT
SHEATH PINNACLE 6F 10CM (SHEATH) ×1 IMPLANT
STENT PROMUS PREM MR 4.0X16 (Permanent Stent) ×1 IMPLANT
TRANSDUCER W/STOPCOCK (MISCELLANEOUS) ×2 IMPLANT
TUBING CIL FLEX 10 FLL-RA (TUBING) ×2 IMPLANT
WIRE COUGAR XT STRL 190CM (WIRE) ×1 IMPLANT
WIRE EMERALD 3MM-J .035X150CM (WIRE) ×1 IMPLANT
WIRE HI TORQ VERSACORE-J 145CM (WIRE) ×1 IMPLANT

## 2017-05-21 NOTE — Discharge Instructions (Signed)
°  RESTART ELIQUIS TOMORROW EVENING.  HOLD METFORMIN FOR 48 HOURS. MAY RESUME ON MON MORNING.  Femoral Site Care Refer to this sheet in the next few weeks. These instructions provide you with information about caring for yourself after your procedure. Your health care provider may also give you more specific instructions. Your treatment has been planned according to current medical practices, but problems sometimes occur. Call your health care provider if you have any problems or questions after your procedure. What can I expect after the procedure? After your procedure, it is typical to have the following:  Bruising at the site that usually fades within 1-2 weeks.  Blood collecting in the tissue (hematoma) that may be painful to the touch. It should usually decrease in size and tenderness within 1-2 weeks.  Follow these instructions at home:  Take medicines only as directed by your health care provider.  You may shower 24-48 hours after the procedure or as directed by your health care provider. Remove the bandage (dressing) and gently wash the site with plain soap and water. Pat the area dry with a clean towel. Do not rub the site, because this may cause bleeding.  Do not take baths, swim, or use a hot tub until your health care provider approves.  Check your insertion site every day for redness, swelling, or drainage.  Do not apply powder or lotion to the site.  Limit use of stairs to twice a day for the first 2-3 days or as directed by your health care provider.  Do not squat for the first 2-3 days or as directed by your health care provider.  Do not lift over 10 lb (4.5 kg) for 5 days after your procedure or as directed by your health care provider.  Ask your health care provider when it is okay to: ? Return to work or school. ? Resume usual physical activities or sports. ? Resume sexual activity.  Do not drive home if you are discharged the same day as the procedure. Have  someone else drive you.  You may drive 24 hours after the procedure unless otherwise instructed by your health care provider.  Do not operate machinery or power tools for 24 hours after the procedure or as directed by your health care provider.  If your procedure was done as an outpatient procedure, which means that you went home the same day as your procedure, a responsible adult should be with you for the first 24 hours after you arrive home.  Keep all follow-up visits as directed by your health care provider. This is important. Contact a health care provider if:  You have a fever.  You have chills.  You have increased bleeding from the site. Hold pressure on the site. Get help right away if:  You have unusual pain at the site.  You have redness, warmth, or swelling at the site.  You have drainage (other than a small amount of blood on the dressing) from the site.  The site is bleeding, and the bleeding does not stop after 30 minutes of holding steady pressure on the site.  Your leg or foot becomes pale, cool, tingly, or numb. This information is not intended to replace advice given to you by your health care provider. Make sure you discuss any questions you have with your health care provider. Document Released: 07/20/2014 Document Revised: 04/23/2016 Document Reviewed: 06/05/2014 Elsevier Interactive Patient Education  [employer].

## 2017-05-21 NOTE — Progress Notes (Signed)
Discussed with pt and wife stent, Plavix, restrictions, ex, diet, NTG, and CRPII. Voiced understanding. Will send referral to [facility].  [place], ACSM 3:01 PM 05/21/2017

## 2017-05-21 NOTE — Interval H&P Note (Signed)
History and Physical Interval Note:  05/21/2017 12:31 PM  Thomas Marshall  has presented today for surgery, with the diagnosis of angina  The various methods of treatment have been discussed with the patient and family. After consideration of risks, benefits and other options for treatment, the patient has consented to  Procedure(s): Left Heart Cath and Cors/Grafts Angiography (N/A) as a surgical intervention .  The patient's history has been reviewed, patient examined, no change in status, stable for surgery.  I have reviewed the patient's chart and labs.  Questions were answered to the patient's satisfaction.     [name]

## 2017-05-21 NOTE — Telephone Encounter (Signed)
entered in error   

## 2017-05-21 NOTE — Discharge Summary (Signed)
Discharge Summary    Patient ID: Thomas Marshall,  MRN: [id number], DOB/AGE: Jun 03, 1941 76 y.o.  Admit date: 05/21/2017 Discharge date: 05/21/2017  Primary Care Provider: [name] Primary Cardiologist: Dr. Burt Knack   Discharge Diagnoses    Principal Problem:   Atypical angina [facility]) Active Problems:   Type 2 diabetes mellitus with renal manifestations ([place])   GERD (gastroesophageal reflux disease)   Hyperlipidemia   S/P CABG x 4 2004   AAA (abdominal aortic aneurysm) (HCC)   CKD (chronic kidney disease) stage 3, GFR 30-59 ml/min   Benign prostatic hyperplasia   Carotid artery disease without cerebral infarction (HCC)   CAD (coronary artery disease)   Essential hypertension   Hypomagnesemia   Persistent atrial fibrillation (HCC)   Allergies Allergies  Allergen Reactions  . Lisinopril Swelling and Other (See Comments)    Angioedema.   . Losartan Swelling and Other (See Comments)    Lips swell Angioedema   . Nifedipine Swelling    Sugar increase  . Triamterene Swelling  . Hydralazine Hcl Other (See Comments)    Fatigue; poor appetite  . Amoxicillin Rash    Has patient had a PCN reaction causing immediate rash, facial/tongue/throat swelling, SOB or lightheadedness with hypotension:No Has patient had a PCN reaction causing severe rash involving mucus membranes or skin necrosis:No Has patient had a PCN reaction that required hospitalization:No Has patient had a PCN reaction occurring within the last 10 years: No If all of the above answers are "NO", then may proceed with Cephalosporin use.   . Ampicillin Rash    Has patient had a PCN reaction causing immediate rash, facial/tongue/throat swelling, SOB or lightheadedness with hypotension:No Has patient had a PCN reaction causing severe rash involving mucus membranes or skin necrosis:No Has patient had a PCN reaction that required hospitalization:No Has patient had a PCN reaction occurring within the last 10 years:  No If all of the above answers are "NO", then may proceed with Cephalosporin use.    . Carvedilol Other (See Comments)    Low heart rate     Diagnostic Studies/Procedures    LHC: 05/21/17 Conclusion  1. Severe native coronary artery disease with total occlusion of the left main and total occlusion of the RCA 2. Status post aortocoronary bypass surgery with continued patency of the LIMA to LAD and RIMA to RCA, patency of the saphenous vein graft to OM1 with a moderate eccentric lesion just distal to the graft insertion site, and patency of the saphenous vein graft to ramus intermedius with a critical lesion in the proximal body the graft treated successfully with PCI using distal embolic protection and a 4.0 mm drug-eluting stent  Recommendations: The patient will be hydrated for 6 hours. He will be discharged this evening with follow-up labs next week. Recommend aspirin, Plavix, and Eliquis 1 month, then discontinue aspirin. He should start back on Eliquis tomorrow evening.   _____________   History of Present Illness     Thomas Marshall is a 76 y.o. male with hx of CAD s/p CABG in 2004, peripheral arterial disease with history of carotid endarterectomy, PAF, HTN, HLD, CKD stage III, small AAA and  bradycardia (on metoprolol and coreg) who presented to [facility] today for elective cardiac cath.   Last echocardiogram 04/2016 showed LV function of 55-60%, mild LVH, grade 2 diastolic dysfunction, bilateral atrial enlargement, mildly dilated aortic root, PA peak pressure of 29 mmHg. Unfortunately he has a lot of medication intolerances.  He was  doing well on cardiac stand point when last seen by Dr. Burt Knack 02/12/17.  He was seen in the office on 05/17/17 for evaluation of fatigue. He reported having exertional dyspnea for the past few months. Relieved with rest. He was not able to do any outside activity. Not particularly any chest pain or shortness of breath. However, symptoms were similar to prior  angina when he had CABG. No orthopnea, PND, syncope, dizziness, lower extremity edema or melena. He bruises easily. Systolic blood pressure runs in 130 to 160 at home. His symptoms were discussed with Dr. Burt Knack and he was referred for outpatient cardiac cath.   Hospital Course     Underwent LHC with Dr. Burt Knack noted above with 3/4 patent grafts with PCI with DES x1 to the SVG--> Ramus Intermediate. Plan will be for triple therapy with ASA, plavix and Eliquis for one month, then stop ASA, but continue plavix/eliquis. He was seen by cardiac rehab in short stay and provided education. Right groin was stable post cath. Prilosec switched to protonix given potential interaction with plavix. Told to hold metformin 48 hours after cath. All questions answered prior to discharge.   Follow up has been arranged in the office. Medications are listed below.  _____________  Discharge Vitals Blood pressure (!) 148/55, pulse (!) 129, temperature 97.6 F (36.4 C), temperature source Oral, resp. rate 17, height 6\' 2"  (1.88 m), weight 220 lb (99.8 kg), SpO2 99 %.  Filed Weights   05/21/17 0953  Weight: 220 lb (99.8 kg)    Labs & Radiologic Studies    CBC No results for input(s): WBC, NEUTROABS, HGB, HCT, MCV, PLT in the last 72 hours. Basic Metabolic Panel No results for input(s): NA, K, CL, CO2, GLUCOSE, BUN, CREATININE, CALCIUM, MG, PHOS in the last 72 hours. Liver Function Tests No results for input(s): AST, ALT, ALKPHOS, BILITOT, PROT, ALBUMIN in the last 72 hours. No results for input(s): LIPASE, AMYLASE in the last 72 hours. Cardiac Enzymes No results for input(s): CKTOTAL, CKMB, CKMBINDEX, TROPONINI in the last 72 hours. BNP Invalid input(s): POCBNP D-Dimer No results for input(s): DDIMER in the last 72 hours. Hemoglobin A1C No results for input(s): HGBA1C in the last 72 hours. Fasting Lipid Panel No results for input(s): CHOL, HDL, LDLCALC, TRIG, CHOLHDL, LDLDIRECT in the last 72  hours. Thyroid Function Tests No results for input(s): TSH, T4TOTAL, T3FREE, THYROIDAB in the last 72 hours.  Invalid input(s): FREET3 _____________  No results found. Disposition   Pt is being discharged home today in good condition.  Follow-up Plans & Appointments    Follow-up Information    [place], Crista Luria, [place] Follow up on 06/08/2017.   Specialty:  Cardiology Why:  at 830am for your follow up appt.  Contact information: [place] [id number] [phone]          Discharge Instructions    Amb Referral to Cardiac Rehabilitation    Complete by:  As directed    Diagnosis:   Coronary Stents PTCA        Discharge Medications   Current Discharge Medication List    START taking these medications   Details    clopidogrel (PLAVIX) 75 MG tablet Take 1 tablet (75 mg total) by mouth daily. Qty: 90 tablet, Refills: 3      pantoprazole (PROTONIX) 40 MG tablet Take 1 tablet (40 mg total) by mouth daily. Qty: 30 tablet, Refills: 1      CONTINUE these medications which have NOT  CHANGED   Details  amLODipine (NORVASC) 10 MG tablet Take 1 tablet (10 mg total) by mouth daily. Qty: 30 tablet, Refills: 11    apixaban (ELIQUIS) 5 MG TABS tablet Take 5 mg by mouth 2 (two) times daily. (0800 & 2000)    aspirin EC 81 MG tablet Take 81 mg by mouth every evening. (2100)    atorvastatin (LIPITOR) 40 MG tablet Take 40 mg by mouth at bedtime.     cholecalciferol (VITAMIN D) 1000 UNITS tablet Take 1,000 Units by mouth every evening.     cloNIDine (CATAPRES) 0.1 MG tablet TAKE ONE TABLET BY MOUTH TWICE DAILY Qty: 180 tablet, Refills: 2    dofetilide (TIKOSYN) 250 MCG capsule Take 1 capsule (250 mcg total) by mouth 2 (two) times daily. Qty: 60 capsule, Refills: 6    glucose blood test strip Use to test blood glucose level 2 times a day. Qty: 100 each, Refills: 12   Associated Diagnoses: Type 2 diabetes mellitus with stage 2 chronic kidney disease,  without long-term current use of insulin (HCC)    magnesium oxide (MAG-OX) 400 MG tablet Take 1 tablet (400 mg total) by mouth 4 (four) times daily. Qty: 360 tablet, Refills: 3    metFORMIN (GLUCOPHAGE) 1000 MG tablet Take 1 tablet (1,000 mg total) by mouth 2 (two) times daily with a meal. Qty: 180 tablet, Refills: 3   Associated Diagnoses: Type 2 diabetes mellitus with stage 2 chronic kidney disease, without long-term current use of insulin (HCC)    nitroGLYCERIN (NITROSTAT) 0.4 MG SL tablet Place 1 tablet (0.4 mg total) under the tongue every 5 (five) minutes as needed for chest pain (x 3 doses). Qty: 30 tablet, Refills: 3    terazosin (HYTRIN) 1 MG capsule Take 1 mg by mouth at bedtime. (2100) Qty: 30 capsule, Refills: 11   Associated Diagnoses: Essential hypertension    triamcinolone cream (KENALOG) 0.1 % Apply 1 application topically 2 (two) times daily. Qty: 30 g, Refills: 1    vitamin C (ASCORBIC ACID) 500 MG tablet Take 500 mg by mouth every evening. (2100)        STOP taking these medications     omeprazole (PRILOSEC) 20 MG capsule           Outstanding Labs/Studies   none  Duration of Discharge Encounter   Greater than 30 minutes including physician time.  Signed, Reino Bellis NP-C 05/21/2017, 4:55 PM

## 2017-05-21 NOTE — H&P (View-Only) (Signed)
Cardiology Office Note    Date:  05/17/2017   ID:  Thomas Marshall, Thomas Marshall 11/30/1941, MRN [id number]  PCP:  [name], MD  Cardiologist:  Dr. Burt Knack  Chief Complaint: Exertional fatigue  History of Present Illness:   Thomas Marshall is a 76 y.o. male with hx of CAD status post CABG in 2004, peripheral arterial disease with history of carotid endarterectomy, PAF, HTN, HLD, CKD stage III, small AAA and  bradycardia (on metoprolol and coreg) presents for fatigue.   Last echocardiogram 04/2016 showed LV function of 55-60%, mild LVH, grade 2 diastolic dysfunction, bilateral atrial enlargement, mildly dilated aortic root, PA peak pressure of 29 mmHg. Unfortunately he has a lot of medication intolerances.  He was doing well on cardiac stand point when last seen by Dr. Burt Knack 02/12/17.  Patient having exertional dyspnea for the past few months. Relieved with rest. He is not able to do any outside activity. Not particularly any chest pain or shortness of breath. However, symptoms is similar to prior angina when he had CABG. No orthopnea, PND, syncope, dizziness, lower extremity edema or melena. He bruises easily. Systolic blood pressure runs in 1:30 to 160 at home.  Past Medical History:  Diagnosis Date  . AAA (abdominal aortic aneurysm) ([place])   . Arthritis   . Cancer (HCC)    skin & squamous cell  . Carotid artery disease ([place])    a. s/p R CEA 12/2015.  [name] CKD (chronic kidney disease), stage III    a. per historical labs.  . Coronary artery disease    a. s/p CABG 2004.  . Diabetes mellitus without complication ([place])   . GERD (gastroesophageal reflux disease)   . Hyperlipidemia   . Hypertension   . Medication intolerance Multiple   . PAF (paroxysmal atrial fibrillation) ([place])   . Pneumonia   . Sinus bradycardia    a. baseline HR 50s-60s, also h/o bradycardia on metoprolol and carvedilol    Past Surgical History:  Procedure Laterality Date  . ANKLE SURGERY     right fused  . BACK  SURGERY     low back   . CHOLECYSTECTOMY N/A 12/07/2013   Procedure: LAPAROSCOPIC CHOLECYSTECTOMY ;  Surgeon: [name], MD;  Location: [place];  Service: General;  Laterality: N/A;  . COLONOSCOPY    . CORONARY ARTERY BYPASS GRAFT  2004   [place]  . ENDARTERECTOMY Right 12/10/2015   Procedure: Right Carotid ENDARTERECTOMY;  Surgeon: Conrad Lebanon, MD;  Location: [facility];  Service: Vascular;  Laterality: Right;  . EXPLORATION POST OPERATIVE OPEN HEART    . FINGER SURGERY     skin graft on rt index  . FINGER SURGERY Right    right index finger.  [name] HERNIA REPAIR Right    [facility]  . JOINT REPLACEMENT    . KNEE SURGERY     replacement on both knees  . PROSTATE SURGERY    . TONSILLECTOMY    . URINARY SURGERY     scar tissue    Current Medications:  Prior to Admission medications   Medication Sig Start Date End Date Taking? Authorizing Provider  amLODipine (NORVASC) 10 MG tablet Take 1 tablet (10 mg total) by mouth daily. 01/19/17  Yes [name], MD  apixaban (ELIQUIS) 5 MG TABS tablet Take 5 mg by mouth 2 (two) times daily.   Yes [provider]  aspirin 81 MG tablet Take 81 mg by mouth daily.   Yes [provider]  atorvastatin (LIPITOR) 40 MG tablet Take 40 mg by mouth daily.   Yes [provider]  cholecalciferol (VITAMIN D) 1000 UNITS tablet Take 1,000 Units by mouth daily.   Yes [provider]  cloNIDine (CATAPRES) 0.1 MG tablet TAKE ONE TABLET BY MOUTH TWICE DAILY 10/15/16  Yes [name], MD  dofetilide (TIKOSYN) 250 MCG capsule Take 1 capsule (250 mcg total) by mouth 2 (two) times daily. 09/30/16  Yes Reino Bellis B, NP  glucose blood test strip Use to test blood glucose level 2 times a day. 01/24/16  Yes Bacigalupo, Dionne Bucy, MD  magnesium oxide (MAG-OX) 400 MG tablet Take 1 tablet (400 mg total) by mouth 4 (four) times daily. 12/03/16  Yes [name], MD  metFORMIN (GLUCOPHAGE) 1000 MG tablet Take 1 tablet (1,000 mg total)  by mouth 2 (two) times daily with a meal. 09/16/16  Yes [name], MD  nitroGLYCERIN (NITROSTAT) 0.4 MG SL tablet Place 1 tablet (0.4 mg total) under the tongue every 5 (five) minutes as needed for chest pain (x 3 doses). 05/22/16  Yes Bacigalupo, Dionne Bucy, MD  omeprazole (PRILOSEC) 20 MG capsule Take 20 mg by mouth daily.   Yes [provider]  terazosin (HYTRIN) 1 MG capsule Take 1 mg by mouth at bedtime. 01/07/17  Yes [name], MD  triamcinolone cream (KENALOG) 0.1 % Apply 1 application topically 2 (two) times daily. 04/30/17 04/30/18 Yes [name], MD  vitamin C (ASCORBIC ACID) 500 MG tablet Take 500 mg by mouth daily.   Yes [provider]    Allergies:   Lisinopril; Losartan; Nifedipine; Triamterene; Hydralazine hcl; Amoxicillin; Ampicillin; and Carvedilol   Social History   Social History  . Marital status: Married    Spouse name: N/A  . Number of children: N/A  . Years of education: N/A   Social History Main Topics  . Smoking status: Former Smoker    Packs/day: 1.00    Years: 13.00    Quit date: 11/30/1970  . Smokeless tobacco: Never Used  . Alcohol use No  . Drug use: No  . Sexual activity: Yes   Other Topics Concern  . None   Social History Narrative  . None     Family History:  The patient's family history includes Cancer in his sister; Diabetes in his brother and sister; Heart disease in his brother, father, and mother; Hyperlipidemia in his brother and father; Hypertension in his brother, father, and sister; Stroke in his mother.   ROS:   Please see the history of present illness.    ROS All other systems reviewed and are negative.   PHYSICAL EXAM:   VS:  BP (!) 146/62   Pulse (!) 57   Ht 6\' 2"  (1.88 m)   Wt 222 lb (100.7 kg)   BMI 28.50 kg/m    GEN: Well nourished, well developed, in no acute distress  HEENT: normal  Neck: no JVD, carotid bruits, or masses Cardiac: RRR; no murmurs, rubs, or gallops,no edema  Respiratory:   clear to auscultation bilaterally, normal work of breathing GI: soft, nontender, nondistended, + BS MS: no deformity or atrophy  Skin: warm and dry, no rash Neuro:  Alert and Oriented x 3, Strength and sensation are intact Psych: euthymic mood, full affect  Wt Readings from Last 3 Encounters:  05/17/17 222 lb (100.7 kg)  04/30/17 221 lb (100.2 kg)  04/08/17 226 lb (102.5 kg)      Studies/Labs Reviewed:   EKG:  EKG is ordered today.  The ekg ordered today demonstrates Sinus bradycardia at rate of 57 bpm and PACs.  Recent Labs: 10/26/2016: Magnesium 1.9 03/16/2017: ALT 13; BUN 25; Creatinine, Ser 1.48; Hemoglobin 12.5; Platelets 139.0; Potassium 4.8; Sodium 141; TSH 3.47   Lipid Panel    Component Value Date/Time   CHOL 107 03/16/2017 0953   TRIG 68.0 03/16/2017 0953   HDL 37.70 (L) 03/16/2017 0953   CHOLHDL 3 03/16/2017 0953   VLDL 13.6 03/16/2017 0953   LDLCALC 56 03/16/2017 0953   LDLDIRECT 67.0 09/16/2016 1455    Additional studies/ records that were reviewed today include:   As above    ASSESSMENT & PLAN:    1. CAD s/p CABG in2004 - His symptoms is concerning for angina. After discussion with Dr. Burt Knack will do cardiac catheterization for definite evaluation of coronary anatomy. Continue ASA and statin.   2. PAF - Maintaining sinus rhythm on Tikosyn. Continue Eliquis.   3.Hypertensive heart disease - Elevated blood pressure. Repeat check 170/84. He is allergic to many antihypertensive. Continue Norvasc 10 mg qd. Might consider adding another agent if still elevated during cath.  4. HLD - Continue Lipitor 40 mg. 03/16/2017: Cholesterol 107; HDL 37.70; LDL Cholesterol 56; Triglycerides 68.0; VLDL 13.6  5. DM - Was not able to tolerate glipizide. Currently only on metformin. Last A1c 7.1.  6. Carotid artery stenosis status post right carotid endarterectomy - Last carotid Doppler 10/2016 showed patent right endarterectomy with 40-59% stenosis on the LICA.    - Followed by Dr. Bridgett Larsson  7. Chronic diastolic CHF - Euvolemic.   Medication Adjustments/Labs and Tests Ordered: Current medicines are reviewed at length with the patient today.  Concerns regarding medicines are outlined above.  Medication changes, Labs and Tests ordered today are listed in the Patient Instructions below. Patient Instructions  Medication Instructions:   Your physician recommends that you continue on your current medications as directed. Please refer to the Current Medication list given to you today.   If you need a refill on your cardiac medications before your next appointment, please call your pharmacy.   Labwork:  CBC PT/INR  BMET AND MAG    Testing/Procedures:  LETTER FOR  L. HEART CATH    Follow-Up:  BASED UPON RESULTS ON CATH    Any Other Special Instructions Will Be Listed Below (If Applicable).                                                                                                                                                      Jarrett Soho, [place]  05/17/2017 4:02 PM    [facility] [address], [place], [place]  [address] Phone: [phone]; Fax: [phone]

## 2017-05-24 ENCOUNTER — Telehealth: Payer: Self-pay | Admitting: Cardiovascular Disease

## 2017-05-24 ENCOUNTER — Encounter (HOSPITAL_COMMUNITY): Payer: Self-pay | Admitting: Cardiovascular Disease

## 2017-05-24 MED FILL — Verapamil HCl IV Soln 2.5 MG/ML: INTRAVENOUS | Qty: 2 | Status: AC

## 2017-05-24 NOTE — Telephone Encounter (Signed)
°  New Prob   Pt states he is due to have blood work to check his kidney function. No orders in EPIC.

## 2017-05-24 NOTE — Telephone Encounter (Signed)
Spoke with patient who is reporting after his cath his wife was told he needed f/u lab work.  There are no orders and no mention of pt needing repeat labs.  Pt's BUN was 26 and Crea 1.57 on 6/18.  Advised pt to keep f/u appt as scheduled and if he needs lab work, he can have it at that time.  He states understanding and is in agreement.

## 2017-05-26 ENCOUNTER — Telehealth: Payer: Self-pay | Admitting: Internal Medicine

## 2017-05-26 DIAGNOSIS — M545 Low back pain: Secondary | ICD-10-CM

## 2017-05-26 MED ORDER — DIAZEPAM 5 MG PO TABS: ORAL_TABLET | ORAL | 0 refills | Status: DC

## 2017-05-26 NOTE — Telephone Encounter (Signed)
Done hardcopy to [place]  

## 2017-05-26 NOTE — Telephone Encounter (Signed)
Pt is having a MRI on July 7th and is asking for a prescription for valium to take for when he has this done. He has a hard time in small places. The MRI was pushed back to July 7th due a heart cath that was done on 05/21/2017.

## 2017-05-26 NOTE — Telephone Encounter (Signed)
Faxed

## 2017-05-27 DIAGNOSIS — L570 Actinic keratosis: Secondary | ICD-10-CM | POA: Diagnosis not present

## 2017-06-03 NOTE — Progress Notes (Signed)
Cardiology Office Note    Date:  06/08/2017   ID:  Thomas Marshall 10-02-1941, MRN [id number]  PCP:  [name], MD  Cardiologist:  Dr. Burt Knack  Chief Complaint: Hospital follow up for cath   History of Present Illness:   Thomas Marshall is a 76 y.o. male Thomas Marshall is a 76 y.o. male with hx of CAD status post CABG in 2004, peripheral arterial disease with history of carotid endarterectomy, PAF, HTN, HLD, CKD stage III, small AAA and  bradycardia (on metoprolol and coreg) presents for follow up.    Last echocardiogram 04/2016 showed LV function of 55-60%, mild LVH, grade 2 diastolic dysfunction, bilateral atrial enlargement, mildly dilated aortic root, PA peak pressure of 29 mmHg. Unfortunately he has a lot of medication intolerances.  He has DOE when seen by me 05/17/17. His symptoms is concerning for angina and set up for cath. He underwent LHC with Dr. Burt Knack noted below with 3/4 patent grafts with PCI with DES x1 to the SVG--> Ramus Intermediate. Plan will be for triple therapy with ASA, plavix and Eliquis for one month, then stop ASA, but continue plavix/eliquis. Some studies suggest Prilosec/Omeprazole interacts with Plavix. We changed your Prilosec/Omeprazole to Protonix for less chance of interaction.   Here today for follow up. He has been walking 20 minutes at a time without dyspnea or angina. His fatigue has improved. His blood pressure runs in 120-120/70-80s at home. He working outside yesterday and noted some dizziness. He was to drink plenty of water. He denies orthopnea, PND, syncope, lower extremity edema, melena, blood in his stool or urine or palpitation.   Past Medical History:  Diagnosis Date  . AAA (abdominal aortic aneurysm) ([place])   . Arthritis   . Cancer (HCC)    skin & squamous cell  . Carotid artery disease ([place])    a. s/p R CEA 12/2015.  [name] CKD (chronic kidney disease), stage III    a. per historical labs.  . Coronary artery disease    a. s/p CABG 2004.    . Diabetes mellitus without complication ([place])   . GERD (gastroesophageal reflux disease)   . Hyperlipidemia   . Hypertension   . Medication intolerance Multiple   . PAF (paroxysmal atrial fibrillation) ([place])   . Pneumonia   . Sinus bradycardia    a. baseline HR 50s-60s, also h/o bradycardia on metoprolol and carvedilol    Past Surgical History:  Procedure Laterality Date  . ANKLE SURGERY     right fused  . BACK SURGERY     low back   . CHOLECYSTECTOMY N/A 12/07/2013   Procedure: LAPAROSCOPIC CHOLECYSTECTOMY ;  Surgeon: [name], MD;  Location: [facility];  Service: General;  Laterality: N/A;  . COLONOSCOPY    . CORONARY ARTERY BYPASS GRAFT  2004   [place]  . CORONARY STENT INTERVENTION N/A 05/21/2017   Procedure: Coronary Stent Intervention;  Surgeon: [name], MD;  Location: [facility];  Service: Cardiovascular;  Laterality: N/A;  . ENDARTERECTOMY Right 12/10/2015   Procedure: Right Carotid ENDARTERECTOMY;  Surgeon: Conrad [place] Ridge, MD;  Location: [facility];  Service: Vascular;  Laterality: Right;  . EXPLORATION POST OPERATIVE OPEN HEART    . FINGER SURGERY     skin graft on rt index  . FINGER SURGERY Right    right index finger.  [name] HERNIA REPAIR Right    RIH  . JOINT REPLACEMENT    . KNEE SURGERY  replacement on both knees  . LEFT HEART CATH AND CORS/GRAFTS ANGIOGRAPHY N/A 05/21/2017   Procedure: Left Heart Cath and Cors/Grafts Angiography;  Surgeon: [name], MD;  Location: [facility];  Service: Cardiovascular;  Laterality: N/A;  . PROSTATE SURGERY    . TONSILLECTOMY    . URINARY SURGERY     scar tissue    Current Medications: Prior to Admission medications   Medication Sig Start Date End Date Taking? Authorizing Provider  amLODipine (NORVASC) 10 MG tablet Take 1 tablet (10 mg total) by mouth daily. Patient taking differently: Take 10 mg by mouth daily. (0800) 01/19/17   [name], MD  apixaban (ELIQUIS) 5 MG TABS tablet Take 5  mg by mouth 2 (two) times daily. (0800 & 2000)    [provider]  aspirin EC 81 MG tablet Take 1 tablet (81 mg total) by mouth daily. 05/21/17   [name], MD  atorvastatin (LIPITOR) 40 MG tablet Take 40 mg by mouth at bedtime.     [provider]  cholecalciferol (VITAMIN D) 1000 UNITS tablet Take 1,000 Units by mouth every evening.     [provider]  cloNIDine (CATAPRES) 0.1 MG tablet TAKE ONE TABLET BY MOUTH TWICE DAILY Patient taking differently: TAKE ONE TABLET BY MOUTH TWICE DAILY (0800 & 2000) 10/15/16   [name], MD  clopidogrel (PLAVIX) 75 MG tablet Take 1 tablet (75 mg total) by mouth daily. 05/21/17   Eileen Stanford, PA-C  diazepam (VALIUM) 5 MG tablet 1 tab by mouth 1 hour prior to MRI 05/26/17   [name], MD  dofetilide (TIKOSYN) 250 MCG capsule Take 1 capsule (250 mcg total) by mouth 2 (two) times daily. Patient taking differently: Take 250 mcg by mouth 2 (two) times daily. (0800 & 2000) 09/30/16   Reino Bellis B, NP  glucose blood test strip Use to test blood glucose level 2 times a day. Patient taking differently: 1 each by Other route as needed. Use to test blood glucose level 2 times a day. 01/24/16   Virginia Crews, MD  magnesium oxide (MAG-OX) 400 MG tablet Take 1 tablet (400 mg total) by mouth 4 (four) times daily. Patient taking differently: Take 800 mg by mouth 2 (two) times daily. (0900 & 2100) 12/03/16   [name], MD  metFORMIN (GLUCOPHAGE) 1000 MG tablet Take 1 tablet (1,000 mg total) by mouth 2 (two) times daily with a meal. Patient taking differently: Take 1,000 mg by mouth 2 (two) times daily with a meal. (0900 & 2100) 09/16/16   [name], MD  nitroGLYCERIN (NITROSTAT) 0.4 MG SL tablet Place 1 tablet (0.4 mg total) under the tongue every 5 (five) minutes as needed for chest pain (x 3 doses). 05/22/16   Bacigalupo, Dionne Bucy, MD  nitroGLYCERIN (NITROSTAT) 0.4 MG SL tablet Place 1 tablet (0.4 mg total) under  the tongue every 5 (five) minutes as needed for chest pain. 05/21/17 05/21/18  Eileen Stanford, PA-C  pantoprazole (PROTONIX) 40 MG tablet Take 1 tablet (40 mg total) by mouth daily. 05/21/17 05/21/18  Eileen Stanford, PA-C  terazosin (HYTRIN) 1 MG capsule Take 1 mg by mouth at bedtime. (2100) 01/07/17   [name], MD  triamcinolone cream (KENALOG) 0.1 % Apply 1 application topically 2 (two) times daily. Patient taking differently: Apply 1 application topically 2 (two) times daily as needed (for rash/irritated skin.).  04/30/17 04/30/18  [name], MD  vitamin C (ASCORBIC ACID) 500 MG tablet  Take 500 mg by mouth every evening. (2100)    [provider]    Allergies:   Lisinopril; Losartan; Nifedipine; Triamterene; Hydralazine hcl; Amoxicillin; Ampicillin; and Carvedilol   Social History   Social History  . Marital status: Married    Spouse name: N/A  . Number of children: N/A  . Years of education: N/A   Social History Main Topics  . Smoking status: Former Smoker    Packs/day: 1.00    Years: 13.00    Quit date: 11/30/1970  . Smokeless tobacco: Never Used  . Alcohol use No  . Drug use: No  . Sexual activity: Yes   Other Topics Concern  . None   Social History Narrative  . None     Family History:  The patient's family history includes Cancer in his sister; Diabetes in his brother and sister; Heart disease in his brother, father, and mother; Hyperlipidemia in his brother and father; Hypertension in his brother, father, and sister; Stroke in his mother.   ROS:   Please see the history of present illness.    ROS All other systems reviewed and are negative.   PHYSICAL EXAM:   VS:  BP (!) 148/62   Pulse (!) 55   Ht 6\' 2"  (1.88 m)   Wt 216 lb 1.9 oz (98 kg)   BMI 27.75 kg/m    GEN: Well nourished, well developed, in no acute distress  HEENT: normal  Neck: no JVD, carotid bruits, or masses Cardiac:RRR; soft sytolic murmurs, rubs, or gallops,no edema    Respiratory:  clear to auscultation bilaterally, normal work of breathing GI: soft, nontender, nondistended, + BS MS: no deformity or atrophy  Skin: warm and dry, no rash Neuro:  Alert and Oriented x 3, Strength and sensation are intact Psych: euthymic mood, full affect  Wt Readings from Last 3 Encounters:  06/08/17 216 lb 1.9 oz (98 kg)  05/21/17 220 lb (99.8 kg)  05/18/17 220 lb (99.8 kg)      Studies/Labs Reviewed:   EKG:  EKG is ordered today.  The ekg ordered today demonstrates sinus rhythm with PACs at rate of 55 bpm  Recent Labs: 03/16/2017: ALT 13; TSH 3.47 05/17/2017: BUN 26; Creatinine, Ser 1.57; Hemoglobin 11.9; Magnesium 1.9; NT-Pro BNP 277; Platelets 119; Potassium 4.5; Sodium 140   Lipid Panel    Component Value Date/Time   CHOL 107 03/16/2017 0953   TRIG 68.0 03/16/2017 0953   HDL 37.70 (L) 03/16/2017 0953   CHOLHDL 3 03/16/2017 0953   VLDL 13.6 03/16/2017 0953   LDLCALC 56 03/16/2017 0953   LDLDIRECT 67.0 09/16/2016 1455    Additional studies/ records that were reviewed today include:   LHC: 05/21/17 Conclusion  1. Severe native coronary artery disease with total occlusion of the left main and total occlusion of the RCA 2. Status post aortocoronary bypass surgery with continued patency of the LIMA to LAD and RIMA to RCA, patency of the saphenous vein graft to OM1 with a moderate eccentric lesion just distal to the graft insertion site, and patency of the saphenous vein graft to ramus intermedius with a critical lesion in the proximal body the graft treated successfully with PCI using distal embolic protection and a 4.0 mm drug-eluting stent  Recommendations: The patient will be hydrated for 6 hours. He will be discharged this evening with follow-up labs next week. Recommend aspirin, Plavix, and Eliquis 1 month, then discontinue aspirin. He should start back on Eliquis tomorrow evening.    ASSESSMENT &  PLAN:    1. CAD s/p CABG in 2004 - S/p recent DES  to SVG -->RI. Continue triple therapy until 7/22. Then stop ASA--> continue plavix and eliquis.  - Continue statin. No further angina or dyspnea. Due to interaction of plavix with omeprazole> changed to protonix.   2. PAF - Maintaining sinus rhythm on Tikosyn. Continue Eliquis.   3.Hypertensive heart disease - Minimally elevated here. Has been normal at home. He is not interested in medication up titration. Advise to bring his blood pressure cuff during next office visit. Continue Norvasc 10 mg qd and clonidine 0.1mg  BID.  4. HLD - Continue Lipitor 40 mg.  - 03/16/2017: Cholesterol 107; HDL 37.70; LDL Cholesterol 56; Triglycerides 68.0; VLDL 13.6  5. DM - Per PCP  6. Carotid artery stenosis status post right carotid endarterectomy - Last carotid Doppler 10/2016 showed patent right endarterectomy with 40-59% stenosis on the LICA.  - Followed by Dr. Bridgett Larsson  7. Chronic diastolic CHF - Euvolemic.  8. CKD, stage III - Check BMET today   Medication Adjustments/Labs and Tests Ordered: Current medicines are reviewed at length with the patient today.  Concerns regarding medicines are outlined above.  Medication changes, Labs and Tests ordered today are listed in the Patient Instructions below. Patient Instructions  Medication Instructions:  Your physician has recommended you make the following change in your medication:  1-STOP taking Aspirin on June 20, 2017  Labwork: Your physician recommends that you have lab work today- BMET  Testing/Procedures: NONE  Follow-Up: Your physician wants you to follow-up in: 2 to 3  months with Dr. Emelda Fear will receive a reminder letter in the mail two months in advance. If you don't receive a letter, please call our office to schedule the follow-up appointment.   If you need a refill on your cardiac medications before your next appointment, please call your pharmacy.      Jarrett Soho, [place]  06/08/2017 8:45 AM    [address], [place], [place]  [address] Phone: [phone]; Fax: [phone]

## 2017-06-05 ENCOUNTER — Ambulatory Visit
Admission: RE | Admit: 2017-06-05 | Discharge: 2017-06-05 | Disposition: A | Discharge status: Home / Self Care | Payer: Medicare Other | Source: Ambulatory Visit | Attending: Internal Medicine | Admitting: Internal Medicine

## 2017-06-05 DIAGNOSIS — M545 Low back pain, unspecified: Secondary | ICD-10-CM

## 2017-06-07 ENCOUNTER — Telehealth: Payer: Self-pay | Admitting: Internal Medicine

## 2017-06-07 DIAGNOSIS — M545 Low back pain, unspecified: Secondary | ICD-10-CM

## 2017-06-07 NOTE — Telephone Encounter (Signed)
Patient states that Dr. Jenny Reichmann referred him for an MRI.  Patient states he could not complete the MRI b/c he felt claustrophobic.  Patient is requesting call back in regard.

## 2017-06-08 ENCOUNTER — Ambulatory Visit (INDEPENDENT_AMBULATORY_CARE_PROVIDER_SITE_OTHER): Payer: Medicare Other | Admitting: Podiatry

## 2017-06-08 ENCOUNTER — Encounter: Payer: Self-pay | Admitting: Physician Assistant

## 2017-06-08 ENCOUNTER — Ambulatory Visit (INDEPENDENT_AMBULATORY_CARE_PROVIDER_SITE_OTHER): Payer: Medicare Other | Admitting: Physician Assistant

## 2017-06-08 ENCOUNTER — Encounter: Payer: Self-pay | Admitting: Podiatry

## 2017-06-08 VITALS — BP 148/62 | HR 55 | Ht 74.0 in | Wt 216.1 lb

## 2017-06-08 DIAGNOSIS — I130 Hypertensive heart and chronic kidney disease with heart failure and stage 1 through stage 4 chronic kidney disease, or unspecified chronic kidney disease: Secondary | ICD-10-CM | POA: Diagnosis not present

## 2017-06-08 DIAGNOSIS — E1140 Type 2 diabetes mellitus with diabetic neuropathy, unspecified: Secondary | ICD-10-CM

## 2017-06-08 DIAGNOSIS — M79674 Pain in right toe(s): Secondary | ICD-10-CM

## 2017-06-08 DIAGNOSIS — I480 Paroxysmal atrial fibrillation: Secondary | ICD-10-CM | POA: Diagnosis not present

## 2017-06-08 DIAGNOSIS — I25708 Atherosclerosis of coronary artery bypass graft(s), unspecified, with other forms of angina pectoris: Secondary | ICD-10-CM | POA: Diagnosis not present

## 2017-06-08 DIAGNOSIS — E118 Type 2 diabetes mellitus with unspecified complications: Secondary | ICD-10-CM

## 2017-06-08 DIAGNOSIS — E785 Hyperlipidemia, unspecified: Secondary | ICD-10-CM | POA: Diagnosis not present

## 2017-06-08 DIAGNOSIS — I10 Essential (primary) hypertension: Secondary | ICD-10-CM | POA: Diagnosis not present

## 2017-06-08 DIAGNOSIS — I779 Disorder of arteries and arterioles, unspecified: Secondary | ICD-10-CM | POA: Diagnosis not present

## 2017-06-08 DIAGNOSIS — I209 Angina pectoris, unspecified: Secondary | ICD-10-CM

## 2017-06-08 DIAGNOSIS — Z79899 Other long term (current) drug therapy: Secondary | ICD-10-CM

## 2017-06-08 DIAGNOSIS — B351 Tinea unguium: Secondary | ICD-10-CM | POA: Diagnosis not present

## 2017-06-08 DIAGNOSIS — M79675 Pain in left toe(s): Secondary | ICD-10-CM | POA: Diagnosis not present

## 2017-06-08 DIAGNOSIS — I739 Peripheral vascular disease, unspecified: Secondary | ICD-10-CM

## 2017-06-08 LAB — BASIC METABOLIC PANEL
BUN/Creatinine Ratio: 15 (ref 10–24)
BUN: 23 mg/dL (ref 8–27)
CO2: 24 mmol/L (ref 20–29)
CREATININE: 1.56 mg/dL — AB (ref 0.76–1.27)
Calcium: 9.2 mg/dL (ref 8.6–10.2)
Chloride: 102 mmol/L (ref 96–106)
GFR calc Af Amer: 49 mL/min/{1.73_m2} — ABNORMAL LOW (ref >59)
GFR calc non Af Amer: 42 mL/min/{1.73_m2} — ABNORMAL LOW (ref >59)
GLUCOSE: 124 mg/dL — AB (ref 65–99)
Potassium: 4.5 mmol/L (ref 3.5–5.2)
Sodium: 141 mmol/L (ref 134–144)

## 2017-06-08 MED ORDER — CLOPIDOGREL BISULFATE 75 MG PO TABS: 75.0000 mg | ORAL_TABLET | Freq: Every day | ORAL | 3 refills | Status: DC

## 2017-06-08 MED ORDER — PANTOPRAZOLE SODIUM 40 MG PO TBEC: 40.0000 mg | DELAYED_RELEASE_TABLET | Freq: Every day | ORAL | 3 refills | Status: AC

## 2017-06-08 NOTE — Progress Notes (Signed)
Patient ID: Thomas Marshall, male   DOB: 1941-10-13, 76 y.o.   MRN: [id number]  Subjective: 76 y.o. returns the office today for painful, elongated, thickened toenails which he cannot trim himself.  No redness or swelling. Denies any acute changes since last appointment and no new complaints today. Denies any systemic complaints such as fevers, chills, nausea, vomiting.   Objective: AAO 3, NAD DP/PT pulses palpable, CRT less than 3 seconds Protective sensation decreased with Simms Weinstein monofilament Nails hypertrophic, dystrophic, elongated, brittle, discolored 10. There is tenderness overlying the nails 1-5 bilaterally. There is no surrounding erythema or drainage along the nail sites. No significant hyperkeratotic lesion right fifth metatarsal base.  No other areas of tenderness bilateral lower extremities. No overlying edema, erythema, increased warmth. No pain with calf compression, swelling, warmth, erythema.  Assessment: Patient presents with symptomatic onychomycosis; minimal hyperkeratotic tissue  Plan: -Treatment options including alternatives, risks, complications were discussed -Nails sharply debrided 10 without complication/bleeding. -Discussed daily foot inspection. If there are any changes, to call the office immediately.  -Follow-up in 3 months or sooner if any problems are to arise. In the meantime, encouraged to call the office with any questions, concerns, changes symptoms.  Celesta Gentile, DPM

## 2017-06-08 NOTE — Patient Instructions (Addendum)
Medication Instructions:  Your physician has recommended you make the following change in your medication:  1-STOP taking Aspirin on June 20, 2017  Labwork: Your physician recommends that you have lab work today- BMET  Testing/Procedures: NONE  Follow-Up: Your physician wants you to follow-up in: 2 to 3  months with Dr. Emelda Fear will receive a reminder letter in the mail two months in advance. If you don't receive a letter, please call our office to schedule the follow-up appointment.   If you need a refill on your cardiac medications before your next appointment, please call your pharmacy.

## 2017-06-09 NOTE — Telephone Encounter (Signed)
Pt called stating that he tried the valium but he was still not able to go through with the MRI.

## 2017-06-09 NOTE — Telephone Encounter (Signed)
Please advise 

## 2017-06-09 NOTE — Telephone Encounter (Signed)
Called pt, LVM.   

## 2017-06-09 NOTE — Telephone Encounter (Signed)
I can refer him to orthopedic if he likes, just let me know

## 2017-06-10 NOTE — Telephone Encounter (Signed)
Pt would like to proceed with the referral.

## 2017-06-10 NOTE — Addendum Note (Signed)
Addended by: [name] on: 06/10/2017 06:38 PM   Modules accepted: Orders

## 2017-06-10 NOTE — Telephone Encounter (Signed)
Pt returned your call. He said that we can go ahead and refer him to an orthopedic.

## 2017-06-10 NOTE — Telephone Encounter (Signed)
Ok, this is done 

## 2017-06-14 NOTE — Telephone Encounter (Signed)
Pls call pt. Pt wants to know why he is being referred to orthopedic pls call pt and explain.

## 2017-06-14 NOTE — Telephone Encounter (Signed)
He was being referred due to the pain. If he is not having further pain, this referral can be cancelled. thanks

## 2017-06-17 NOTE — Telephone Encounter (Signed)
PER [facility]) [facility] requested a nre order to be put in due to 1st order being marked out...Thomas Marshall

## 2017-07-07 DIAGNOSIS — M545 Low back pain: Secondary | ICD-10-CM | POA: Diagnosis not present

## 2017-07-09 ENCOUNTER — Other Ambulatory Visit: Payer: Self-pay | Admitting: Cardiovascular Disease

## 2017-07-13 NOTE — Telephone Encounter (Signed)
Error

## 2017-07-19 ENCOUNTER — Ambulatory Visit (INDEPENDENT_AMBULATORY_CARE_PROVIDER_SITE_OTHER): Payer: Medicare Other | Admitting: *Deleted

## 2017-07-19 DIAGNOSIS — I209 Angina pectoris, unspecified: Secondary | ICD-10-CM | POA: Diagnosis not present

## 2017-07-19 DIAGNOSIS — I4891 Unspecified atrial fibrillation: Secondary | ICD-10-CM | POA: Diagnosis not present

## 2017-07-19 LAB — CBC
Hematocrit: 33.8 % — ABNORMAL LOW (ref 37.5–51.0)
Hemoglobin: 11.5 g/dL — ABNORMAL LOW (ref 13.0–17.7)
MCH: 31.1 pg (ref 26.6–33.0)
MCHC: 34 g/dL (ref 31.5–35.7)
MCV: 91 fL (ref 79–97)
PLATELETS: 139 10*3/uL — AB (ref 150–379)
RBC: 3.7 x10E6/uL — ABNORMAL LOW (ref 4.14–5.80)
RDW: 13.8 % (ref 12.3–15.4)
WBC: 4.9 10*3/uL (ref 3.4–10.8)

## 2017-07-19 LAB — BASIC METABOLIC PANEL
BUN/Creatinine Ratio: 15 (ref 10–24)
BUN: 29 mg/dL — ABNORMAL HIGH (ref 8–27)
CALCIUM: 8.9 mg/dL (ref 8.6–10.2)
CHLORIDE: 103 mmol/L (ref 96–106)
CO2: 24 mmol/L (ref 20–29)
Creatinine, Ser: 1.88 mg/dL — ABNORMAL HIGH (ref 0.76–1.27)
GFR calc Af Amer: 39 mL/min/{1.73_m2} — ABNORMAL LOW (ref >59)
GFR, EST NON AFRICAN AMERICAN: 33 mL/min/{1.73_m2} — AB (ref >59)
Glucose: 123 mg/dL — ABNORMAL HIGH (ref 65–99)
POTASSIUM: 4.7 mmol/L (ref 3.5–5.2)
SODIUM: 141 mmol/L (ref 134–144)

## 2017-07-20 NOTE — Progress Notes (Signed)
Pt was started on Eliquis 5mg  BID for Afib on 12/22/16.    Reviewed patients medication list.  Pt is not currently on any combined P-gp and strong CYP3A4 inhibitors/inducers (ketoconazole, traconazole, ritonavir, carbamazepine, phenytoin, rifampin, St. John's wort).  Reviewed labs:  SCr 1.88, Weight 98.9 Kg, Age 76 yrs old. Dose appropriate based on age, weight, and SCr.  Hgb and HCT 11.5/33.8.   A full discussion of the nature of anticoagulants has been carried out.  A benefit/risk analysis has been presented to the patient, so that they understand the justification for choosing anticoagulation with Eliquis at this time.  The need for compliance is stressed.  Pt is aware to take the medication twice daily.  Side effects of potential bleeding are discussed, including unusual colored urine or stools, coughing up blood or coffee ground emesis, nose bleeds or serious fall or head trauma.  Discussed signs and symptoms of stroke. The patient should avoid any OTC items containing aspirin or ibuprofen.  Avoid alcohol consumption.   Call if any signs of abnormal bleeding.  Discussed financial obligations and resolved any difficulty in obtaining medication.    07/20/17- Spoke with patient and informed him we will continue his Eliquis 5mg  BID. Follow up with Cardiologist as scheduled.

## 2017-07-22 ENCOUNTER — Ambulatory Visit (INDEPENDENT_AMBULATORY_CARE_PROVIDER_SITE_OTHER): Payer: Medicare Other | Admitting: General Practice

## 2017-07-22 DIAGNOSIS — J111 Influenza due to unidentified influenza virus with other respiratory manifestations: Secondary | ICD-10-CM

## 2017-07-22 DIAGNOSIS — Z23 Encounter for immunization: Secondary | ICD-10-CM | POA: Diagnosis not present

## 2017-07-22 NOTE — Progress Notes (Signed)
Medical screening examination/treatment/procedure(s) were performed by non-physician practitioner and as supervising physician I was immediately available for consultation/collaboration. I agree with above. Maliek Schellhorn, MD   

## 2017-07-24 NOTE — Progress Notes (Signed)
[name] [address], [place] [address] Phone: [phone] Subjective:    I'm seeing this patient by the request  of:    CC: back pain   [id number]  Thomas Marshall is a 76 y.o. male coming in with complaint of Back pain. Seems to be right-sided. Intermittent. Not every day. Seems to be worsening though. Has seen other providers before and was told that there was a possibility of a kidney stone. Had been in the past. Recently though workup did not show any recent kidney stones. Patient is just wondering what could possibly be. Rates the severity of pain as 5 out of 10 Sometimes his left knee pain. Describes it as a dull aching pain. Nothing severe. Patient does not associated with his leg.    Past Medical History:  Diagnosis Date  . AAA (abdominal aortic aneurysm) ([facility])   . Arthritis   . Cancer (HCC)    skin & squamous cell  . Carotid artery disease ([facility])    a. s/p R CEA 12/2015.  [name] CKD (chronic kidney disease), stage III    a. per historical labs.  . Coronary artery disease    a. s/p CABG 2004.  . Diabetes mellitus without complication ([facility])   . GERD (gastroesophageal reflux disease)   . Hyperlipidemia   . Hypertension   . Medication intolerance Multiple   . PAF (paroxysmal atrial fibrillation) ([facility])   . Pneumonia   . Sinus bradycardia    a. baseline HR 50s-60s, also h/o bradycardia on metoprolol and carvedilol   Past Surgical History:  Procedure Laterality Date  . ANKLE SURGERY     right fused  . BACK SURGERY     low back   . CHOLECYSTECTOMY N/A 12/07/2013   Procedure: LAPAROSCOPIC CHOLECYSTECTOMY ;  Surgeon: [name], MD;  Location: [facility];  Service: General;  Laterality: N/A;  . COLONOSCOPY    . CORONARY ARTERY BYPASS GRAFT  2004   [place]  . CORONARY STENT INTERVENTION N/A 05/21/2017   Procedure: Coronary Stent Intervention;  Surgeon: [name], MD;  Location: [facility];  Service: Cardiovascular;   Laterality: N/A;  . ENDARTERECTOMY Right 12/10/2015   Procedure: Right Carotid ENDARTERECTOMY;  Surgeon: Conrad Bartlett, MD;  Location: [facility];  Service: Vascular;  Laterality: Right;  . EXPLORATION POST OPERATIVE OPEN HEART    . FINGER SURGERY     skin graft on rt index  . FINGER SURGERY Right    right index finger.  [name] HERNIA REPAIR Right    [facility]  . JOINT REPLACEMENT    . KNEE SURGERY     replacement on both knees  . LEFT HEART CATH AND CORS/GRAFTS ANGIOGRAPHY N/A 05/21/2017   Procedure: Left Heart Cath and Cors/Grafts Angiography;  Surgeon: [name], MD;  Location: [facility];  Service: Cardiovascular;  Laterality: N/A;  . PROSTATE SURGERY    . TONSILLECTOMY    . URINARY SURGERY     scar tissue   Social History   Social History  . Marital status: Married    Spouse name: N/A  . Number of children: N/A  . Years of education: N/A   Social History Main Topics  . Smoking status: Former Smoker    Packs/day: 1.00    Years: 13.00    Quit date: 11/30/1970  . Smokeless tobacco: Never Used  . Alcohol use No  . Drug use: No  . Sexual activity: Yes  Other Topics Concern  . Not on file   Social History Narrative  . No narrative on file   Allergies  Allergen Reactions  . Lisinopril Swelling and Other (See Comments)    Angioedema.   . Losartan Swelling and Other (See Comments)    Lips swell Angioedema   . Nifedipine Swelling    Sugar increase  . Triamterene Swelling  . Hydralazine Hcl Other (See Comments)    Fatigue; poor appetite  . Amoxicillin Rash    Has patient had a PCN reaction causing immediate rash, facial/tongue/throat swelling, SOB or lightheadedness with hypotension:No Has patient had a PCN reaction causing severe rash involving mucus membranes or skin necrosis:No Has patient had a PCN reaction that required hospitalization:No Has patient had a PCN reaction occurring within the last 10 years: No If all of the above answers are "NO", then may proceed  with Cephalosporin use.   . Ampicillin Rash    Has patient had a PCN reaction causing immediate rash, facial/tongue/throat swelling, SOB or lightheadedness with hypotension:No Has patient had a PCN reaction causing severe rash involving mucus membranes or skin necrosis:No Has patient had a PCN reaction that required hospitalization:No Has patient had a PCN reaction occurring within the last 10 years: No If all of the above answers are "NO", then may proceed with Cephalosporin use.    . Carvedilol Other (See Comments)    Low heart rate    Family History  Problem Relation Age of Onset  . Stroke Mother   . Heart disease Mother   . Heart disease Father   . Hypertension Father   . Hyperlipidemia Father   . Cancer Sister        breast  and skin  . Diabetes Sister   . Hypertension Sister   . Diabetes Brother   . Heart disease Brother   . Hyperlipidemia Brother   . Hypertension Brother   . Early death Neg Hx      Past medical history, social, surgical and family history all reviewed in electronic medical record.  No pertanent information unless stated regarding to the chief complaint.   Review of Systems:Review of systems updated and as accurate as of 07/24/17  No headache, visual changes, nausea, vomiting, diarrhea, constipation, dizziness, abdominal pain, skin rash, fevers, chills, night sweats, weight loss, swollen lymph nodes, body aches, joint swelling, , chest pain, shortness of breath, mood changes. Positive muscle aches  Objective  There were no vitals taken for this visit. Systems examined below as of 07/24/17   General: No apparent distress alert and oriented x3 mood and affect normal, dressed appropriately.  HEENT: Pupils equal, extraocular movements intact  Respiratory: Patient's speak in full sentences and does not appear short of breath  Cardiovascular: No lower extremity edema, non tender, no erythema  Skin: Warm dry intact with no signs of infection or rash on  extremities or on axial skeleton.  Abdomen: Soft nontender  Neuro: Cranial nerves II through XII are intact, neurovascularly intact in all extremities with 2+ DTRs and 2+ pulses.  Lymph: No lymphadenopathy of posterior or anterior cervical chain or axillae bilaterally.  Gait normal with good balance and coordination.  MSK:  Non tender with full range of motion and good stability and symmetric strength and tone of shoulders, elbows, wrist, hip, and ankles bilaterally.  Back Exam:  Inspection: Loss of lordosis with mild degenerative scoliosis Motion: Flexion 40 deg, Extension 25 deg, Side Bending to 30 deg bilaterally,  Rotation to 25 deg bilaterally  SLR laying: Significant tightness of the hamstrings bilaterally left couldn't and right XSLR laying: Negative  Palpable tenderness: Tender to palpation in the paraspinal musculature of the lumbar spine right greater than left mostly at the thoracolumbar juncture. FABER: negative. Sensory change: Gross sensation intact to all lumbar and sacral dermatomes.  Reflexes: 2+ at both patellar tendons, 2+ at achilles tendons, Babinski's downgoing.  Strength at foot  Plantar-flexion: 5/5 Dorsi-flexion: 5/5 Eversion: 5/5 Inversion: 5/5  Leg strength  Quad: 5/5 Hamstring: 5/5 Hip flexor: 5/5 Hip abductors: 5/5  Gait unremarkable.  Patient has bilateral knee replacements. Seems to have good stability. No swelling noted. Minimal tenderness to palpation. Has 110 of flexion  [id number]; 15 additional minutes spent for Therapeutic exercises as stated in above notes.  This included exercises focusing on stretching, strengthening, with significant focus on eccentric aspects.   Long term goals include an improvement in range of motion, strength, endurance as well as avoiding reinjury. Patient's frequency would include in 1-2 times a day, 3-5 times a week for a duration of 6-12 weeks. Low back exercises that included:  Pelvic tilt/bracing instruction to focus on control  of the pelvic girdle and lower abdominal muscles  Glute strengthening exercises, focusing on proper firing of the glutes without engaging the low back muscles Proper stretching techniques for maximum relief for the hamstrings, hip flexors, low back and some rotation where tolerated   Proper technique shown and discussed handout in great detail with ATC.  All questions were discussed and answered.     Impression and Recommendations:     This case required medical decision making of moderate complexity.      Note: This dictation was prepared with Dragon dictation along with smaller phrase technology. Any transcriptional errors that result from this process are unintentional.

## 2017-07-26 ENCOUNTER — Ambulatory Visit (INDEPENDENT_AMBULATORY_CARE_PROVIDER_SITE_OTHER): Payer: Medicare Other | Admitting: Family Medicine

## 2017-07-26 ENCOUNTER — Encounter: Payer: Self-pay | Admitting: Family Medicine

## 2017-07-26 DIAGNOSIS — I209 Angina pectoris, unspecified: Secondary | ICD-10-CM | POA: Diagnosis not present

## 2017-07-26 DIAGNOSIS — M5136 Other intervertebral disc degeneration, lumbar region: Secondary | ICD-10-CM | POA: Diagnosis not present

## 2017-07-26 MED ORDER — DICLOFENAC SODIUM 2 % TD SOLN: TRANSDERMAL | 3 refills | Status: DC

## 2017-07-26 NOTE — Assessment & Plan Note (Signed)
Thomas Marshall the patient does have more of a degenerative disc disease of the lumbar spine. Possible mild radicular symptoms going down the left sign but patient does have significant tightness of the hamstrings. We discussed with patient about home exercises, icing protocol, which activities to do a which ones to avoid. We discussed continuing the medications. Patient will follow-up with me again in 4 weeks to see how patient response.

## 2017-07-26 NOTE — Patient Instructions (Signed)
Good to see you  Ice 20 minutes 2 times daily. Usually after activity and before bed. Exercises 3 times a week.  Thigh compression sleeve on the left leg with a lot of activity to help the hamstring pennsaid pinkie amount topically 2 times daily as needed.   See me again in 4 weeks to see how you are doing.

## 2017-08-11 ENCOUNTER — Encounter: Payer: Self-pay | Admitting: Family Medicine

## 2017-08-11 ENCOUNTER — Ambulatory Visit: Payer: Self-pay

## 2017-08-11 ENCOUNTER — Ambulatory Visit (INDEPENDENT_AMBULATORY_CARE_PROVIDER_SITE_OTHER): Payer: Medicare Other | Admitting: Family Medicine

## 2017-08-11 VITALS — BP 142/66 | HR 66 | Ht 74.0 in | Wt 217.0 lb

## 2017-08-11 DIAGNOSIS — L03115 Cellulitis of right lower limb: Secondary | ICD-10-CM | POA: Diagnosis not present

## 2017-08-11 DIAGNOSIS — M25561 Pain in right knee: Secondary | ICD-10-CM

## 2017-08-11 DIAGNOSIS — I209 Angina pectoris, unspecified: Secondary | ICD-10-CM | POA: Diagnosis not present

## 2017-08-11 MED ORDER — CIPROFLOXACIN HCL 500 MG PO TABS: 500.0000 mg | ORAL_TABLET | Freq: Two times a day (BID) | ORAL | 0 refills | Status: DC

## 2017-08-11 MED ORDER — DOXYCYCLINE HYCLATE 100 MG PO TABS: 100.0000 mg | ORAL_TABLET | Freq: Two times a day (BID) | ORAL | 0 refills | Status: AC

## 2017-08-11 NOTE — Progress Notes (Signed)
[name] [address] [place], [place] [address] Phone: [phone] Subjective:      CC: Right knee pain and swelling  [id number]  Thomas Marshall is a 76 y.o. male coming in with complaint of right knee pain. Patient did fall recently. Past medical history significant for replacement. 5 days ago started having some redness on the anterior aspect of the knee. 3 days ago started having discharge from the area. Patient has been expressing pus. Patient states that his minorly sore. States that the skin around it is red. Still able to move the knee. Denies any fevers chills or any abnormal weight loss.     Past Medical History:  Diagnosis Date  . AAA (abdominal aortic aneurysm) ([facility])   . Arthritis   . Cancer (HCC)    skin & squamous cell  . Carotid artery disease ([facility])    a. s/p R CEA 12/2015.  [name] CKD (chronic kidney disease), stage III    a. per historical labs.  . Coronary artery disease    a. s/p CABG 2004.  . Diabetes mellitus without complication ([facility])   . GERD (gastroesophageal reflux disease)   . Hyperlipidemia   . Hypertension   . Medication intolerance Multiple   . PAF (paroxysmal atrial fibrillation) ([facility])   . Pneumonia   . Sinus bradycardia    a. baseline HR 50s-60s, also h/o bradycardia on metoprolol and carvedilol   Past Surgical History:  Procedure Laterality Date  . ANKLE SURGERY     right fused  . BACK SURGERY     low back   . CHOLECYSTECTOMY N/A 12/07/2013   Procedure: LAPAROSCOPIC CHOLECYSTECTOMY ;  Surgeon: [name], MD;  Location: [facility];  Service: General;  Laterality: N/A;  . COLONOSCOPY    . CORONARY ARTERY BYPASS GRAFT  2004   [place]  . CORONARY STENT INTERVENTION N/A 05/21/2017   Procedure: Coronary Stent Intervention;  Surgeon: [name], MD;  Location: [facility];  Service: Cardiovascular;  Laterality: N/A;  . ENDARTERECTOMY Right 12/10/2015   Procedure: Right Carotid ENDARTERECTOMY;   Surgeon: Conrad Marcus, MD;  Location: [facility];  Service: Vascular;  Laterality: Right;  . EXPLORATION POST OPERATIVE OPEN HEART    . FINGER SURGERY     skin graft on rt index  . FINGER SURGERY Right    right index finger.  [name] HERNIA REPAIR Right    [facility]  . JOINT REPLACEMENT    . KNEE SURGERY     replacement on both knees  . LEFT HEART CATH AND CORS/GRAFTS ANGIOGRAPHY N/A 05/21/2017   Procedure: Left Heart Cath and Cors/Grafts Angiography;  Surgeon: [name], MD;  Location: [facility];  Service: Cardiovascular;  Laterality: N/A;  . PROSTATE SURGERY    . TONSILLECTOMY    . URINARY SURGERY     scar tissue   Social History   Social History  . Marital status: Married    Spouse name: N/A  . Number of children: N/A  . Years of education: N/A   Social History Main Topics  . Smoking status: Former Smoker    Packs/day: 1.00    Years: 13.00    Quit date: 11/30/1970  . Smokeless tobacco: Never Used  . Alcohol use No  . Drug use: No  . Sexual activity: Yes   Other Topics Concern  . None   Social History Narrative  . None   Allergies  Allergen Reactions  . Lisinopril  Swelling and Other (See Comments)    Angioedema.   . Losartan Swelling and Other (See Comments)    Lips swell Angioedema   . Nifedipine Swelling    Sugar increase  . Triamterene Swelling  . Hydralazine Hcl Other (See Comments)    Fatigue; poor appetite  . Amoxicillin Rash    Has patient had a PCN reaction causing immediate rash, facial/tongue/throat swelling, SOB or lightheadedness with hypotension:No Has patient had a PCN reaction causing severe rash involving mucus membranes or skin necrosis:No Has patient had a PCN reaction that required hospitalization:No Has patient had a PCN reaction occurring within the last 10 years: No If all of the above answers are "NO", then may proceed with Cephalosporin use.   . Ampicillin Rash    Has patient had a PCN reaction causing immediate rash,  facial/tongue/throat swelling, SOB or lightheadedness with hypotension:No Has patient had a PCN reaction causing severe rash involving mucus membranes or skin necrosis:No Has patient had a PCN reaction that required hospitalization:No Has patient had a PCN reaction occurring within the last 10 years: No If all of the above answers are "NO", then may proceed with Cephalosporin use.    . Carvedilol Other (See Comments)    Low heart rate    Family History  Problem Relation Age of Onset  . Stroke Mother   . Heart disease Mother   . Heart disease Father   . Hypertension Father   . Hyperlipidemia Father   . Cancer Sister        breast  and skin  . Diabetes Sister   . Hypertension Sister   . Diabetes Brother   . Heart disease Brother   . Hyperlipidemia Brother   . Hypertension Brother   . Early death Neg Hx      Past medical history, social, surgical and family history all reviewed in electronic medical record.  No pertanent information unless stated regarding to the chief complaint.   Review of Systems:Review of systems updated and as accurate as of 08/11/17  No headache, visual changes, nausea, vomiting, diarrhea, constipation, dizziness, abdominal pain, skin rash, fevers, chills, night sweats, weight loss, swollen lymph nodes, body aches, joint swelling, muscle aches, chest pain, shortness of breath, mood changes.   Objective  Blood pressure (!) 142/66, pulse 66, height 6\' 2"  (1.88 m), weight 217 lb (98.4 kg), SpO2 97 %. Systems examined below as of 08/11/17   General: No apparent distress alert and oriented x3 mood and affect normal, dressed appropriately.  HEENT: Pupils equal, extraocular movements intact  Respiratory: Patient's speak in full sentences and does not appear short of breath  Cardiovascular: No lower extremity edema, non tender, no erythema  Skin: Warm dry intact with no signs of infection or rash on extremities or on axial skeleton.  Abdomen: Soft nontender    Neuro: Cranial nerves II through XII are intact, neurovascularly intact in all extremities with 2+ DTRs and 2+ pulses.  Lymph: No lymphadenopathy of posterior or anterior cervical chain or axillae bilaterally.  Gait Antalgic gait  MSK:  Non tender with full range of motion and good stability and symmetric strength and tone of shoulders, elbows, wrist, hip, and ankles bilaterally. Right knee exam shows the patient does have what appears to be a cellulitis over the anterior aspect of the knee. Central opening noted. When pressed patient does have mild amount of pus noted. No instability of the knee replacement. Patient does have near full range of motion.  Limited musculoskeletal ultrasound  was performed and interpreted by Lyndal Pulley  Limited ultrasound the patient's patellar area does show an overlying cellulitis with hypoechoic changes and increasing Doppler flow noted. His does appear to go towards the prosthesis. No effusion of the joint noted. Impression: Likely cellulitis with no involvement of the prosthesis  Impression and Recommendations:     This case required medical decision making of moderate complexity.      Note: This dictation was prepared with Dragon dictation along with smaller phrase technology. Any transcriptional errors that result from this process are unintentional.

## 2017-08-11 NOTE — Patient Instructions (Signed)
gooo see you  Ice 20 minutes 2 times daily. Usually after activity and before bed. Change bandage daily and put the antibiotic on it Doxycycline and cipro 2 times a day for 10 days If it gets worse go to emergency room  See me again on Monday (ok to double book)

## 2017-08-11 NOTE — Assessment & Plan Note (Signed)
Patient does have more of a cellulitis of the right knee. I do think that this is likely more superficial. Doxycycline as well as ciprofloxacin given for double coverage of Pseudomonas with patient's diabetes. We discussed with patient though that with patient having a replacement in with the ultrasound findings there could be a possibility for a deeper infection and if worsening symptoms need to go and seek medical attention immediately. Patient will come back again in 5 days. We discussed which activities, which was to come home and follow-up with me again in 5 days as we discussed.

## 2017-08-16 ENCOUNTER — Encounter: Payer: Self-pay | Admitting: Family Medicine

## 2017-08-16 ENCOUNTER — Ambulatory Visit (INDEPENDENT_AMBULATORY_CARE_PROVIDER_SITE_OTHER): Payer: Medicare Other | Admitting: Family Medicine

## 2017-08-16 DIAGNOSIS — L03115 Cellulitis of right lower limb: Secondary | ICD-10-CM | POA: Diagnosis not present

## 2017-08-16 DIAGNOSIS — I209 Angina pectoris, unspecified: Secondary | ICD-10-CM | POA: Diagnosis not present

## 2017-08-16 NOTE — Assessment & Plan Note (Signed)
Significant improvement already. If any worsening patient knows to come back sooner otherwise will follow-up again in 2 weeks to make sure completely resolved. Continue the about it.

## 2017-08-16 NOTE — Patient Instructions (Signed)
Good to see you  Overall looks much better Lotion 2 times a day  Monitor for any swelling or worsening redness.  Continue the antibiotics See me again in 2 weeks to make sure it is completely gone.

## 2017-08-16 NOTE — Progress Notes (Signed)
[name] [place], [place] [address] Phone: [phone] Subjective:    I'm seeing this patient by the request  of:    CC: right knee pain follow up   OYD:XAJOINOMVE  Thomas Marshall is a 76 y.o. male coming in with complaint of right knee pain. Says his knee is doing better. There was a concern for patient having more of a cellulitis States that much less redness. Less pain overall. No fevers or chills. No side effects to the indications.      Past Medical History:  Diagnosis Date  . AAA (abdominal aortic aneurysm) ([place])   . Arthritis   . Cancer (HCC)    skin & squamous cell  . Carotid artery disease ([place])    a. s/p R CEA 12/2015.  [name] CKD (chronic kidney disease), stage III    a. per historical labs.  . Coronary artery disease    a. s/p CABG 2004.  . Diabetes mellitus without complication ([place])   . GERD (gastroesophageal reflux disease)   . Hyperlipidemia   . Hypertension   . Medication intolerance Multiple   . PAF (paroxysmal atrial fibrillation) ([place])   . Pneumonia   . Sinus bradycardia    a. baseline HR 50s-60s, also h/o bradycardia on metoprolol and carvedilol   Past Surgical History:  Procedure Laterality Date  . ANKLE SURGERY     right fused  . BACK SURGERY     low back   . CHOLECYSTECTOMY N/A 12/07/2013   Procedure: LAPAROSCOPIC CHOLECYSTECTOMY ;  Surgeon: [name], MD;  Location: [facility];  Service: General;  Laterality: N/A;  . COLONOSCOPY    . CORONARY ARTERY BYPASS GRAFT  2004   [place]  . CORONARY STENT INTERVENTION N/A 05/21/2017   Procedure: Coronary Stent Intervention;  Surgeon: [name], MD;  Location: [facility];  Service: Cardiovascular;  Laterality: N/A;  . ENDARTERECTOMY Right 12/10/2015   Procedure: Right Carotid ENDARTERECTOMY;  Surgeon: Conrad Scales Mound, MD;  Location: [facility];  Service: Vascular;  Laterality: Right;  . EXPLORATION POST OPERATIVE OPEN HEART    . FINGER SURGERY     skin  graft on rt index  . FINGER SURGERY Right    right index finger.  [name] HERNIA REPAIR Right    [facility]  . JOINT REPLACEMENT    . KNEE SURGERY     replacement on both knees  . LEFT HEART CATH AND CORS/GRAFTS ANGIOGRAPHY N/A 05/21/2017   Procedure: Left Heart Cath and Cors/Grafts Angiography;  Surgeon: [name], MD;  Location: [facility];  Service: Cardiovascular;  Laterality: N/A;  . PROSTATE SURGERY    . TONSILLECTOMY    . URINARY SURGERY     scar tissue   Social History   Social History  . Marital status: Married    Spouse name: N/A  . Number of children: N/A  . Years of education: N/A   Social History Main Topics  . Smoking status: Former Smoker    Packs/day: 1.00    Years: 13.00    Quit date: 11/30/1970  . Smokeless tobacco: Never Used  . Alcohol use No  . Drug use: No  . Sexual activity: Yes   Other Topics Concern  . None   Social History Narrative  . None   Allergies  Allergen Reactions  . Lisinopril Swelling and Other (See Comments)    Angioedema.   . Losartan Swelling and Other (See Comments)  Lips swell Angioedema   . Nifedipine Swelling    Sugar increase  . Triamterene Swelling  . Hydralazine Hcl Other (See Comments)    Fatigue; poor appetite  . Amoxicillin Rash    Has patient had a PCN reaction causing immediate rash, facial/tongue/throat swelling, SOB or lightheadedness with hypotension:No Has patient had a PCN reaction causing severe rash involving mucus membranes or skin necrosis:No Has patient had a PCN reaction that required hospitalization:No Has patient had a PCN reaction occurring within the last 10 years: No If all of the above answers are "NO", then may proceed with Cephalosporin use.   . Ampicillin Rash    Has patient had a PCN reaction causing immediate rash, facial/tongue/throat swelling, SOB or lightheadedness with hypotension:No Has patient had a PCN reaction causing severe rash involving mucus membranes or skin  necrosis:No Has patient had a PCN reaction that required hospitalization:No Has patient had a PCN reaction occurring within the last 10 years: No If all of the above answers are "NO", then may proceed with Cephalosporin use.    . Carvedilol Other (See Comments)    Low heart rate    Family History  Problem Relation Age of Onset  . Stroke Mother   . Heart disease Mother   . Heart disease Father   . Hypertension Father   . Hyperlipidemia Father   . Cancer Sister        breast  and skin  . Diabetes Sister   . Hypertension Sister   . Diabetes Brother   . Heart disease Brother   . Hyperlipidemia Brother   . Hypertension Brother   . Early death Neg Hx      Past medical history, social, surgical and family history all reviewed in electronic medical record.  No pertanent information unless stated regarding to the chief complaint.   Review of Systems:Review of systems updated and as accurate as of 08/16/17  No headache, visual changes, nausea, vomiting, diarrhea, constipation, dizziness, abdominal pain, skin rash, fevers, chills, night sweats, weight loss, swollen lymph nodes, body aches, joint swelling, chest pain, shortness of breath, mood changes. Positive muscle aches  Objective  Blood pressure 120/60, pulse 67, height 6\' 2"  (1.88 m), weight 215 lb (97.5 kg), SpO2 97 %. Systems examined below as of 08/16/17   General: No apparent distress alert and oriented x3 mood and affect normal, dressed appropriately.  HEENT: Pupils equal, extraocular movements intact  Respiratory: Patient's speak in full sentences and does not appear short of breath  Cardiovascular: No lower extremity edema, non tender, no erythema  Skin: Warm dry intact with no signs of infection or rash on extremities or on axial skeleton.  Abdomen: Soft nontender  Neuro: Cranial nerves II through XII are intact, neurovascularly intact in all extremities with 2+ DTRs and 2+ pulses.  Lymph: No lymphadenopathy of  posterior or anterior cervical chain or axillae bilaterally.  Gait Mild antalgic gait MSK:  Non tender with full range of motion and good stability and symmetric strength and tone of shoulders, elbows, wrist, hip and ankles bilaterally.  Right knee exam shows replacement. Patient's redness any) previously is a most completely resolved at this time. Patient has full range of motion. Neurovascularly intact distally.    Impression and Recommendations:     This case required medical decision making of moderate complexity.      Note: This dictation was prepared with Dragon dictation along with smaller phrase technology. Any transcriptional errors that result from this process are unintentional.

## 2017-08-30 ENCOUNTER — Ambulatory Visit: Payer: Medicare Other | Admitting: Internal Medicine

## 2017-08-30 ENCOUNTER — Ambulatory Visit (INDEPENDENT_AMBULATORY_CARE_PROVIDER_SITE_OTHER): Payer: Medicare Other | Admitting: Family Medicine

## 2017-08-30 ENCOUNTER — Encounter: Payer: Self-pay | Admitting: Family Medicine

## 2017-08-30 DIAGNOSIS — I209 Angina pectoris, unspecified: Secondary | ICD-10-CM | POA: Diagnosis not present

## 2017-08-30 DIAGNOSIS — L03115 Cellulitis of right lower limb: Secondary | ICD-10-CM | POA: Diagnosis not present

## 2017-08-30 NOTE — Progress Notes (Signed)
[name] [place], [place] [address] Phone: [phone] Subjective:    CC: Right knee follow-up  EPP:IRJJOACZYS  Thomas Marshall is a 76 y.o. male coming in for follow up for knee pain. He states that his pain has been improving. He took medication which cleared up the pain in the right knee. He is still having issues with the left knee. Feels that it is tight though. Patient states some evidence the same feeling in his been since he is having the replacement. Still able to do daily activities. Denies any significant instability.        Past Medical History:  Diagnosis Date  . AAA (abdominal aortic aneurysm) ([place])   . Arthritis   . Cancer (HCC)    skin & squamous cell  . Carotid artery disease ([place])    a. s/p R CEA 12/2015.  [name] CKD (chronic kidney disease), stage III ([place])    a. per historical labs.  . Coronary artery disease    a. s/p CABG 2004.  . Diabetes mellitus without complication ([place])   . GERD (gastroesophageal reflux disease)   . Hyperlipidemia   . Hypertension   . Medication intolerance Multiple   . PAF (paroxysmal atrial fibrillation) ([place])   . Pneumonia   . Sinus bradycardia    a. baseline HR 50s-60s, also h/o bradycardia on metoprolol and carvedilol   Past Surgical History:  Procedure Laterality Date  . ANKLE SURGERY     right fused  . BACK SURGERY     low back   . CHOLECYSTECTOMY N/A 12/07/2013   Procedure: LAPAROSCOPIC CHOLECYSTECTOMY ;  Surgeon: [name], MD;  Location: [facility];  Service: General;  Laterality: N/A;  . COLONOSCOPY    . CORONARY ARTERY BYPASS GRAFT  2004   [place]  . CORONARY STENT INTERVENTION N/A 05/21/2017   Procedure: Coronary Stent Intervention;  Surgeon: [name], MD;  Location: [facility];  Service: Cardiovascular;  Laterality: N/A;  . ENDARTERECTOMY Right 12/10/2015   Procedure: Right Carotid ENDARTERECTOMY;  Surgeon: Conrad Deerfield, MD;  Location: [facility];  Service:  Vascular;  Laterality: Right;  . EXPLORATION POST OPERATIVE OPEN HEART    . FINGER SURGERY     skin graft on rt index  . FINGER SURGERY Right    right index finger.  [name] HERNIA REPAIR Right    [facility]  . JOINT REPLACEMENT    . KNEE SURGERY     replacement on both knees  . LEFT HEART CATH AND CORS/GRAFTS ANGIOGRAPHY N/A 05/21/2017   Procedure: Left Heart Cath and Cors/Grafts Angiography;  Surgeon: [name], MD;  Location: [facility];  Service: Cardiovascular;  Laterality: N/A;  . PROSTATE SURGERY    . TONSILLECTOMY    . URINARY SURGERY     scar tissue   Social History   Social History  . Marital status: Married    Spouse name: N/A  . Number of children: N/A  . Years of education: N/A   Social History Main Topics  . Smoking status: Former Smoker    Packs/day: 1.00    Years: 13.00    Quit date: 11/30/1970  . Smokeless tobacco: Never Used  . Alcohol use No  . Drug use: No  . Sexual activity: Yes   Other Topics Concern  . None   Social History Narrative  . None   Allergies  Allergen Reactions  . Lisinopril Swelling and Other (See Comments)  Angioedema.   . Losartan Swelling and Other (See Comments)    Lips swell Angioedema   . Nifedipine Swelling    Sugar increase  . Triamterene Swelling  . Hydralazine Hcl Other (See Comments)    Fatigue; poor appetite  . Amoxicillin Rash    Has patient had a PCN reaction causing immediate rash, facial/tongue/throat swelling, SOB or lightheadedness with hypotension:No Has patient had a PCN reaction causing severe rash involving mucus membranes or skin necrosis:No Has patient had a PCN reaction that required hospitalization:No Has patient had a PCN reaction occurring within the last 10 years: No If all of the above answers are "NO", then may proceed with Cephalosporin use.   . Ampicillin Rash    Has patient had a PCN reaction causing immediate rash, facial/tongue/throat swelling, SOB or lightheadedness with  hypotension:No Has patient had a PCN reaction causing severe rash involving mucus membranes or skin necrosis:No Has patient had a PCN reaction that required hospitalization:No Has patient had a PCN reaction occurring within the last 10 years: No If all of the above answers are "NO", then may proceed with Cephalosporin use.    . Carvedilol Other (See Comments)    Low heart rate    Family History  Problem Relation Age of Onset  . Stroke Mother   . Heart disease Mother   . Heart disease Father   . Hypertension Father   . Hyperlipidemia Father   . Cancer Sister        breast  and skin  . Diabetes Sister   . Hypertension Sister   . Diabetes Brother   . Heart disease Brother   . Hyperlipidemia Brother   . Hypertension Brother   . Early death Neg Hx      Past medical history, social, surgical and family history all reviewed in electronic medical record.  No pertanent information unless stated regarding to the chief complaint.   Review of Systems:Review of systems updated and as accurate as of 08/30/17  No headache, visual changes, nausea, vomiting, diarrhea, constipation, dizziness, abdominal pain, skin rash, fevers, chills, night sweats, weight loss, swollen lymph nodes, body aches, joint swelling,  chest pain, shortness of breath, mood changes. Positive muscle aches  Objective  Blood pressure 132/72, pulse (!) 49, height 6\' 2"  (1.88 m), weight 214 lb (97.1 kg), SpO2 97 %. Systems examined below as of 08/30/17   General: No apparent distress alert and oriented x3 mood and affect normal, dressed appropriately.  HEENT: Pupils equal, extraocular movements intact  Respiratory: Patient's speak in full sentences and does not appear short of breath  Cardiovascular: No lower extremity edema, non tender, no erythema  Skin: Warm dry intact with no signs of infection or rash on extremities or on axial skeleton.  Abdomen: Soft nontender  Neuro: Cranial nerves II through XII are intact,  neurovascularly intact in all extremities with 2+ DTRs and 2+ pulses.  Lymph: No lymphadenopathy of posterior or anterior cervical chain or axillae bilaterally.  Gait normal with good balance and coordination.  MSK:  Non tender with full range of motion and good stability and symmetric strength and tone of shoulders, elbows, wrist, hip, and ankles bilaterally.  Knee replacement is noted. Patient does not have any erythema this time. Still lacks last 10 of flexion bilaterally.   Impression and Recommendations:     This case required medical decision making of moderate complexity.      Note: This dictation was prepared with Dragon dictation along with smaller phrase technology.  Any transcriptional errors that result from this process are unintentional.

## 2017-08-30 NOTE — Assessment & Plan Note (Signed)
Patient on longer is having difficulty with this. Patient did have a knee replacement that that was concerning for. No erythema left. Can discontinue the antibiotics once completely done with the regimen. Follow-up as needed

## 2017-09-04 ENCOUNTER — Observation Stay (HOSPITAL_COMMUNITY)
Admission: AD | Admit: 2017-09-04 | Discharge: 2017-09-05 | Disposition: A | Discharge status: Home / Self Care | Payer: Medicare Other | Source: Other Acute Inpatient Hospital | Attending: Cardiovascular Disease | Admitting: Cardiovascular Disease

## 2017-09-04 ENCOUNTER — Telehealth: Payer: Self-pay | Admitting: Nurse Practitioner

## 2017-09-04 DIAGNOSIS — Z7902 Long term (current) use of antithrombotics/antiplatelets: Secondary | ICD-10-CM | POA: Diagnosis not present

## 2017-09-04 DIAGNOSIS — E1122 Type 2 diabetes mellitus with diabetic chronic kidney disease: Secondary | ICD-10-CM | POA: Insufficient documentation

## 2017-09-04 DIAGNOSIS — I481 Persistent atrial fibrillation: Secondary | ICD-10-CM | POA: Insufficient documentation

## 2017-09-04 DIAGNOSIS — I4519 Other right bundle-branch block: Secondary | ICD-10-CM | POA: Diagnosis not present

## 2017-09-04 DIAGNOSIS — I480 Paroxysmal atrial fibrillation: Secondary | ICD-10-CM | POA: Diagnosis not present

## 2017-09-04 DIAGNOSIS — D696 Thrombocytopenia, unspecified: Secondary | ICD-10-CM | POA: Diagnosis not present

## 2017-09-04 DIAGNOSIS — Z951 Presence of aortocoronary bypass graft: Secondary | ICD-10-CM | POA: Diagnosis not present

## 2017-09-04 DIAGNOSIS — R05 Cough: Secondary | ICD-10-CM | POA: Diagnosis not present

## 2017-09-04 DIAGNOSIS — E785 Hyperlipidemia, unspecified: Secondary | ICD-10-CM | POA: Insufficient documentation

## 2017-09-04 DIAGNOSIS — I4819 Other persistent atrial fibrillation: Secondary | ICD-10-CM | POA: Diagnosis present

## 2017-09-04 DIAGNOSIS — R404 Transient alteration of awareness: Secondary | ICD-10-CM | POA: Diagnosis not present

## 2017-09-04 DIAGNOSIS — I129 Hypertensive chronic kidney disease with stage 1 through stage 4 chronic kidney disease, or unspecified chronic kidney disease: Secondary | ICD-10-CM | POA: Diagnosis not present

## 2017-09-04 DIAGNOSIS — R42 Dizziness and giddiness: Secondary | ICD-10-CM | POA: Diagnosis not present

## 2017-09-04 DIAGNOSIS — Z880 Allergy status to penicillin: Secondary | ICD-10-CM | POA: Diagnosis not present

## 2017-09-04 DIAGNOSIS — K219 Gastro-esophageal reflux disease without esophagitis: Secondary | ICD-10-CM | POA: Diagnosis not present

## 2017-09-04 DIAGNOSIS — D649 Anemia, unspecified: Secondary | ICD-10-CM | POA: Diagnosis not present

## 2017-09-04 DIAGNOSIS — I714 Abdominal aortic aneurysm, without rupture: Secondary | ICD-10-CM | POA: Diagnosis not present

## 2017-09-04 DIAGNOSIS — I2510 Atherosclerotic heart disease of native coronary artery without angina pectoris: Secondary | ICD-10-CM | POA: Diagnosis present

## 2017-09-04 DIAGNOSIS — R002 Palpitations: Secondary | ICD-10-CM | POA: Diagnosis not present

## 2017-09-04 DIAGNOSIS — I493 Ventricular premature depolarization: Secondary | ICD-10-CM

## 2017-09-04 DIAGNOSIS — Z888 Allergy status to other drugs, medicaments and biological substances status: Secondary | ICD-10-CM | POA: Diagnosis not present

## 2017-09-04 DIAGNOSIS — I10 Essential (primary) hypertension: Secondary | ICD-10-CM | POA: Diagnosis present

## 2017-09-04 DIAGNOSIS — N183 Chronic kidney disease, stage 3 unspecified: Secondary | ICD-10-CM | POA: Diagnosis present

## 2017-09-04 DIAGNOSIS — Z79899 Other long term (current) drug therapy: Secondary | ICD-10-CM | POA: Insufficient documentation

## 2017-09-04 DIAGNOSIS — I4510 Unspecified right bundle-branch block: Secondary | ICD-10-CM | POA: Insufficient documentation

## 2017-09-04 DIAGNOSIS — Z955 Presence of coronary angioplasty implant and graft: Secondary | ICD-10-CM | POA: Insufficient documentation

## 2017-09-04 DIAGNOSIS — Z7901 Long term (current) use of anticoagulants: Secondary | ICD-10-CM

## 2017-09-04 DIAGNOSIS — I499 Cardiac arrhythmia, unspecified: Secondary | ICD-10-CM | POA: Diagnosis not present

## 2017-09-04 DIAGNOSIS — Z85828 Personal history of other malignant neoplasm of skin: Secondary | ICD-10-CM | POA: Insufficient documentation

## 2017-09-04 DIAGNOSIS — R001 Bradycardia, unspecified: Principal | ICD-10-CM | POA: Diagnosis present

## 2017-09-04 DIAGNOSIS — Z87891 Personal history of nicotine dependence: Secondary | ICD-10-CM | POA: Insufficient documentation

## 2017-09-04 DIAGNOSIS — R5383 Other fatigue: Secondary | ICD-10-CM | POA: Diagnosis not present

## 2017-09-04 DIAGNOSIS — R231 Pallor: Secondary | ICD-10-CM | POA: Diagnosis not present

## 2017-09-04 DIAGNOSIS — R531 Weakness: Secondary | ICD-10-CM | POA: Diagnosis not present

## 2017-09-04 LAB — COMPREHENSIVE METABOLIC PANEL
ALK PHOS: 60 U/L (ref 38–126)
ALT: 14 U/L — AB (ref 17–63)
AST: 19 U/L (ref 15–41)
Albumin: 3.5 g/dL (ref 3.5–5.0)
Anion gap: 7 (ref 5–15)
BUN: 27 mg/dL — AB (ref 6–20)
CALCIUM: 8.5 mg/dL — AB (ref 8.9–10.3)
CHLORIDE: 104 mmol/L (ref 101–111)
CO2: 27 mmol/L (ref 22–32)
CREATININE: 1.57 mg/dL — AB (ref 0.61–1.24)
GFR calc Af Amer: 47 mL/min — ABNORMAL LOW (ref >60)
GFR calc non Af Amer: 41 mL/min — ABNORMAL LOW (ref >60)
GLUCOSE: 179 mg/dL — AB (ref 65–99)
Potassium: 4.4 mmol/L (ref 3.5–5.1)
SODIUM: 138 mmol/L (ref 135–145)
Total Bilirubin: 0.9 mg/dL (ref 0.3–1.2)
Total Protein: 5.9 g/dL — ABNORMAL LOW (ref 6.5–8.1)

## 2017-09-04 LAB — MAGNESIUM: Magnesium: 1.4 mg/dL — ABNORMAL LOW (ref 1.7–2.4)

## 2017-09-04 LAB — GLUCOSE, CAPILLARY: GLUCOSE-CAPILLARY: 124 mg/dL — AB (ref 65–99)

## 2017-09-04 LAB — PROTIME-INR
INR: 1.19
Prothrombin Time: 15 seconds (ref 11.4–15.2)

## 2017-09-04 LAB — APTT: aPTT: 35 seconds (ref 24–36)

## 2017-09-04 MED ORDER — APIXABAN 5 MG PO TABS: 5.0000 mg | ORAL_TABLET | Freq: Two times a day (BID) | ORAL | Status: DC

## 2017-09-04 MED ORDER — ONDANSETRON HCL 4 MG/2ML IJ SOLN: 4.0000 mg | Freq: Four times a day (QID) | INTRAMUSCULAR | Status: DC | PRN

## 2017-09-04 MED ORDER — ACETAMINOPHEN 325 MG PO TABS: 650.0000 mg | ORAL_TABLET | ORAL | Status: DC | PRN

## 2017-09-04 MED ORDER — CLONIDINE HCL 0.1 MG PO TABS
0.1000 mg | ORAL_TABLET | Freq: Two times a day (BID) | ORAL | Status: DC
  Administered 2017-09-04 – 2017-09-05 (×2): 0.1 mg via ORAL
  Filled 2017-09-04 (×2): qty 1

## 2017-09-04 MED ORDER — MAGNESIUM SULFATE 4 GM/100ML IV SOLN
4.0000 g | Freq: Once | INTRAVENOUS | Status: AC
  Administered 2017-09-04: 4 g via INTRAVENOUS
  Filled 2017-09-04 (×2): qty 100

## 2017-09-04 MED ORDER — NITROGLYCERIN 0.4 MG SL SUBL: 0.4000 mg | SUBLINGUAL_TABLET | SUBLINGUAL | Status: DC | PRN

## 2017-09-04 MED ORDER — INSULIN ASPART 100 UNIT/ML ~~LOC~~ SOLN: 0.0000 [IU] | Freq: Three times a day (TID) | SUBCUTANEOUS | Status: DC

## 2017-09-04 MED ORDER — MAGNESIUM OXIDE 400 (241.3 MG) MG PO TABS
400.0000 mg | ORAL_TABLET | Freq: Four times a day (QID) | ORAL | Status: DC
  Administered 2017-09-04 – 2017-09-05 (×3): 400 mg via ORAL
  Filled 2017-09-04 (×4): qty 1

## 2017-09-04 MED ORDER — PANTOPRAZOLE SODIUM 40 MG PO TBEC
40.0000 mg | DELAYED_RELEASE_TABLET | Freq: Every day | ORAL | Status: DC
  Administered 2017-09-05: 40 mg via ORAL
  Filled 2017-09-04: qty 1

## 2017-09-04 MED ORDER — ATORVASTATIN CALCIUM 40 MG PO TABS: 40.0000 mg | ORAL_TABLET | Freq: Every day | ORAL | Status: DC

## 2017-09-04 MED ORDER — VITAMIN C 500 MG PO TABS: 500.0000 mg | ORAL_TABLET | Freq: Every evening | ORAL | Status: DC

## 2017-09-04 MED ORDER — TERAZOSIN HCL 1 MG PO CAPS
1.0000 mg | ORAL_CAPSULE | Freq: Every day | ORAL | Status: DC
  Administered 2017-09-04: 1 mg via ORAL
  Filled 2017-09-04: qty 1

## 2017-09-04 MED ORDER — CLOPIDOGREL BISULFATE 75 MG PO TABS
75.0000 mg | ORAL_TABLET | Freq: Every day | ORAL | Status: DC
  Administered 2017-09-05: 75 mg via ORAL
  Filled 2017-09-04: qty 1

## 2017-09-04 MED ORDER — AMLODIPINE BESYLATE 10 MG PO TABS
10.0000 mg | ORAL_TABLET | Freq: Every day | ORAL | Status: DC
  Administered 2017-09-05: 10 mg via ORAL
  Filled 2017-09-04: qty 1

## 2017-09-04 MED ORDER — AMLODIPINE BESYLATE 10 MG PO TABS: 10.0000 mg | ORAL_TABLET | Freq: Every day | ORAL | Status: DC

## 2017-09-04 MED ORDER — VITAMIN D3 25 MCG (1000 UNIT) PO TABS
1000.0000 [IU] | ORAL_TABLET | Freq: Every evening | ORAL | Status: DC
  Filled 2017-09-04: qty 1

## 2017-09-04 MED ORDER — DOFETILIDE 250 MCG PO CAPS
250.0000 ug | ORAL_CAPSULE | Freq: Two times a day (BID) | ORAL | Status: DC
  Administered 2017-09-04 – 2017-09-05 (×2): 250 ug via ORAL
  Filled 2017-09-04 (×2): qty 1

## 2017-09-04 NOTE — H&P (Addendum)
Cardiology Admission History and Physical:   Patient ID: Thomas Marshall; MRN: [id number]; DOB: 01/15/1941   Admission date: 09/04/2017  Primary Care Provider: [name], MD Primary Cardiologist: Dr. Burt Knack  Chief Complaint:  Bradycardia, [place]  Patient Profile:   Thomas Marshall is a 76 y.o. male with a history of CAD (s/p 4vCABG in 2004, PCI to SVG-ramus on 05/21/17), DM2, GERD, HL, AAA, CKD, HTN, paroxysmal AF (on tikosyn) who presented to [facility] with 2 days of intermittent LH and one day of bradycardia.   History of Present Illness:   Thomas Marshall reports that for the past 2-3 days he has had feelings of LH and wooziness that were self limited and lasted a couple hours.  His wife reports that during these spells he gets quite pale.  He denies associated CP, SOB, or any syncope/presyncope.  Today, he says he woke up and felt well, took his medications then again felt this wooziness.  States that his wife took his BP and it was 100/44 with an HR of 31 prompting him to go to the ER.  In the ER, he was noted to be in sinus rhythm with frequent PVCs.   Past Medical History:  Diagnosis Date  . AAA (abdominal aortic aneurysm) ([place])   . Arthritis   . Cancer (HCC)    skin & squamous cell  . Carotid artery disease ([place])    a. s/p R CEA 12/2015.  [name] CKD (chronic kidney disease), stage III ([place])    a. per historical labs.  . Coronary artery disease    a. s/p CABG 2004.  . Diabetes mellitus without complication ([place])   . GERD (gastroesophageal reflux disease)   . Hyperlipidemia   . Hypertension   . Medication intolerance Multiple   . PAF (paroxysmal atrial fibrillation) ([place])   . Pneumonia   . Sinus bradycardia    a. baseline HR 50s-60s, also h/o bradycardia on metoprolol and carvedilol    Past Surgical History:  Procedure Laterality Date  . ANKLE SURGERY     right fused  . BACK SURGERY     low back   . CHOLECYSTECTOMY N/A 12/07/2013   Procedure: LAPAROSCOPIC CHOLECYSTECTOMY ;   Surgeon: [name], MD;  Location: [place];  Service: General;  Laterality: N/A;  . COLONOSCOPY    . CORONARY ARTERY BYPASS GRAFT  2004   [place]  . CORONARY STENT INTERVENTION N/A 05/21/2017   Procedure: Coronary Stent Intervention;  Surgeon: [name], MD;  Location: [place] [facility];  Service: Cardiovascular;  Laterality: N/A;  . ENDARTERECTOMY Right 12/10/2015   Procedure: Right Carotid ENDARTERECTOMY;  Surgeon: Conrad Itasca, MD;  Location: [place];  Service: Vascular;  Laterality: Right;  . EXPLORATION POST OPERATIVE OPEN HEART    . FINGER SURGERY     skin graft on rt index  . FINGER SURGERY Right    right index finger.  [name] HERNIA REPAIR Right    RIH  . JOINT REPLACEMENT    . KNEE SURGERY     replacement on both knees  . LEFT HEART CATH AND CORS/GRAFTS ANGIOGRAPHY N/A 05/21/2017   Procedure: Left Heart Cath and Cors/Grafts Angiography;  Surgeon: [name], MD;  Location: [place] [facility];  Service: Cardiovascular;  Laterality: N/A;  . PROSTATE SURGERY    . TONSILLECTOMY    . URINARY SURGERY     scar tissue     Medications Prior to Admission: Prior to Admission medications  Medication Sig Start Date End Date Taking? Authorizing Provider  amLODipine (NORVASC) 10 MG tablet Take 1 tablet (10 mg total) by mouth daily. 01/19/17  Yes [name], MD  apixaban (ELIQUIS) 5 MG TABS tablet Take 5 mg by mouth 2 (two) times daily. (0800 & 2000)   Yes [provider]  atorvastatin (LIPITOR) 40 MG tablet Take 40 mg by mouth at bedtime.    Yes [provider]  cholecalciferol (VITAMIN D) 1000 UNITS tablet Take 1,000 Units by mouth every evening.    Yes [provider]  cloNIDine (CATAPRES) 0.1 MG tablet TAKE ONE TABLET BY MOUTH TWICE DAILY 07/09/17  Yes [name], MD  clopidogrel (PLAVIX) 75 MG tablet Take 1 tablet (75 mg total) by mouth daily. 06/08/17  Yes Bhagat, Bhavinkumar, PA  dofetilide (TIKOSYN) 250 MCG capsule Take 1 capsule (250  mcg total) by mouth 2 (two) times daily. 09/30/16  Yes Reino Bellis B, NP  glucose blood test strip Use to test blood glucose level 2 times a day. 01/24/16  Yes Bacigalupo, Dionne Bucy, MD  magnesium oxide (MAG-OX) 400 MG tablet Take 1 tablet (400 mg total) by mouth 4 (four) times daily. 12/03/16  Yes [name], MD  metFORMIN (GLUCOPHAGE) 1000 MG tablet Take 1 tablet (1,000 mg total) by mouth 2 (two) times daily with a meal. 09/16/16  Yes [name], MD  pantoprazole (PROTONIX) 40 MG tablet Take 1 tablet (40 mg total) by mouth daily. 06/08/17 06/08/18 Yes Bhagat, Bhavinkumar, PA  terazosin (HYTRIN) 1 MG capsule Take 1 mg by mouth at bedtime. (2100) 01/07/17  Yes [name], MD  triamcinolone cream (KENALOG) 0.1 % Apply 1 application topically 2 (two) times daily. 04/30/17 04/30/18 Yes [name], MD  vitamin C (ASCORBIC ACID) 500 MG tablet Take 500 mg by mouth every evening. (2100)   Yes [provider]  nitroGLYCERIN (NITROSTAT) 0.4 MG SL tablet Place 1 tablet (0.4 mg total) under the tongue every 5 (five) minutes as needed for chest pain (x 3 doses). 05/22/16   Virginia Crews, MD     Allergies:    Allergies  Allergen Reactions  . Lisinopril Swelling and Other (See Comments)    Angioedema.   . Losartan Swelling and Other (See Comments)    Lips swell Angioedema   . Nifedipine Swelling    Sugar increase  . Triamterene Swelling  . Hydralazine Hcl Other (See Comments)    Fatigue; poor appetite  . Amoxicillin Rash    Has patient had a PCN reaction causing immediate rash, facial/tongue/throat swelling, SOB or lightheadedness with hypotension:No Has patient had a PCN reaction causing severe rash involving mucus membranes or skin necrosis:No Has patient had a PCN reaction that required hospitalization:No Has patient had a PCN reaction occurring within the last 10 years: No If all of the above answers are "NO", then may proceed with Cephalosporin use.   . Ampicillin Rash      Has patient had a PCN reaction causing immediate rash, facial/tongue/throat swelling, SOB or lightheadedness with hypotension:No Has patient had a PCN reaction causing severe rash involving mucus membranes or skin necrosis:No Has patient had a PCN reaction that required hospitalization:No Has patient had a PCN reaction occurring within the last 10 years: No If all of the above answers are "NO", then may proceed with Cephalosporin use.    . Carvedilol Other (See Comments)    Low heart rate     Social History:   Social History   Social  History  . Marital status: Married    Spouse name: N/A  . Number of children: N/A  . Years of education: N/A   Occupational History  . Not on file.   Social History Main Topics  . Smoking status: Former Smoker    Packs/day: 1.00    Years: 13.00    Quit date: 11/30/1970  . Smokeless tobacco: Never Used  . Alcohol use No  . Drug use: No  . Sexual activity: Yes   Other Topics Concern  . Not on file   Social History Narrative  . No narrative on file    Family History:   The patient's family history includes Cancer in his sister; Diabetes in his brother and sister; Heart disease in his brother, father, and mother; Hyperlipidemia in his brother and father; Hypertension in his brother, father, and sister; Stroke in his mother. There is no history of Early death.    ROS:  Please see the history of present illness.  ROS notable for: Cardiac: no CP, palpitations, syncope: Pulm: no SOB, no orthopnea GI: loose stools, but not frequent, no constipation Neuro: no new numbness or weakness All other ROS reviewed and negative.     Physical Exam/Data:   Vitals:   09/04/17 1916 09/04/17 1920  BP:  (!) 151/63  Pulse:  88  Resp:  20  SpO2:  98%  Weight: 100 kg (220 lb 6.4 oz)   Height: 6\' 2"  (1.88 m)    No intake or output data in the 24 hours ending 09/04/17 2056 Filed Weights   09/04/17 1916  Weight: 100 kg (220 lb 6.4 oz)   Body mass  index is 28.3 kg/m.  General:  Well nourished, well developed, in no acute distress HEENT: normal Lymph: no adenopathy Neck: no JVD Endocrine:  No thryomegaly Vascular: No carotid bruits; FA pulses 2+ bilaterally without bruits  Cardiac:  normal S1, S2;  no murmur, irregular rhythm Lungs:  clear to auscultation bilaterally, no wheezing, rhonchi or rales  Abd: soft, nontender, no hepatomegaly  Ext: no edema Musculoskeletal:  No deformities, BUE and BLE strength normal and equal Skin: warm and dry  Neuro:  CNs 2-12 intact, no focal abnormalities noted Psych:  Normal affect    EKG:  The ECG that was done was personally reviewed and demonstrates sinus rhythm with PVCs at a fixed coupling interval as well as PACs from an alternate atrial focus, incomplete RBBB  Relevant CV Studies: TTE 05/27/16: - Normal LV size with mild LV hypertrophy. EF 55-60%. Moderate   diastolic dysfunction. Normal RV size and systolic function.   Aortic sclerosis without significant stenosis. Biatrial   enlargement.  LHC 05/21/17: 1. Severe native coronary artery disease with total occlusion of the left main and total occlusion of the RCA 2. Status post aortocoronary bypass surgery with continued patency of the LIMA to LAD and RIMA to RCA, patency of the saphenous vein graft to OM1 with a moderate eccentric lesion just distal to the graft insertion site, and patency of the saphenous vein graft to ramus intermedius with a critical lesion in the proximal body the graft treated successfully with PCI using distal embolic protection and a 4.0 mm drug-eluting stent  Recommendations: The patient will be hydrated for 6 hours. He will be discharged this evening with follow-up labs next week. Recommend aspirin, Plavix, and Eliquis 1 month, then discontinue aspirin. He should start back on Eliquis tomorrow evening.    Laboratory Data:  Labs from [facility] (via  [facility]): Trop neg x2 Glu 97 UA neg CBC w/ Hb  of 11.5, no leukocytosis, plts 116 INR 1.4 Chem panel with Cr of 1.57 Normal LFTs CXR: no acute cardiopulmonary disease ECG: NSR with frequetn PVCs, incomplete RBBB   Chemistry  Recent Labs Lab 09/04/17 1953  NA 138  K 4.4  CL 104  CO2 27  GLUCOSE 179*  BUN 27*  CREATININE 1.57*  CALCIUM 8.5*  GFRNONAA 41*  GFRAA 47*  ANIONGAP 7     Recent Labs Lab 09/04/17 1953  PROT 5.9*  ALBUMIN 3.5  AST 19  ALT 14*  ALKPHOS 60  BILITOT 0.9   HematologyNo results for input(s): WBC, RBC, HGB, HCT, MCV, MCH, MCHC, RDW, PLT in the last 168 hours. Cardiac EnzymesNo results for input(s): TROPONINI in the last 168 hours. No results for input(s): TROPIPOC in the last 168 hours.  BNPNo results for input(s): BNP, PROBNP in the last 168 hours.  DDimer No results for input(s): DDIMER in the last 168 hours.  Radiology/Studies:  No results found.  Assessment and Plan:   1. Bradycardia 2/2 frequent PVCs: Pt presents with reports of bradycardia and LH for the past 2-3 days.  ECG demonstrates NSR with frequent PVCs and compensatory pauses.  Only nodal agent pt currently on is amlodipine; however, this does not appear to be an issue with AV conduction, rather it is due to ventricular ectopy.  His electrolytes are normal and there is no clear reversible cause for his ectopy. The ectopy could be supressible with addition of beta blocker, but that may also worsen the patient's bradycardia.  Ultimately, this patient may need beta blockade for both PVC supression (given how symptomatic it makes him) as well as for cardioprotection for his CAD.  Pacemaker placement may be warranted to allow for initiation of beta blockade without the consequence of bradycardia.  Will plan to have the EP team evaluate him tomorrow for PPM placement.  Patient is currently asymptomatic and hemodynamically stable, so we will continue to watch him on telemetry overnight. 1. Monitor on telemetry 2. EP team to evaluate in  am 2. CAD s/p CABG and PCI: Pt with recent admission for exertional dyspnea when he was subsequently found to have a lesion in the SVG to ramus which was successfully treated with PCI.  He has not had recurrence of his CP since his PCI and has been adherent with his medications. 1. Continue atorvastatin, eliquis, plavix 3. Atrial fibrillation: current in NSR, CHADS2VASC = 4 1. Continue tikosyn 2. Hold eliquis (in case of possible ppm procedure early next week) 4. HTN: BP currently well controlled, given normal PR and QRS duration no concern for excessive nodal effect, will continue amlodipine. 1. Continue clonidine, amlodipine 5. DM2: Last A1C 7.1 (02/2017), not on insulin at home 1. SSI while in patient 6. CKD: Cr at baseline 1. Continue to monitor 7.  Admission Bundle 1. On PPI as outpatient (continue) 2. Anticoagulated with eliquis 3. Full code    Severity of Illness: The appropriate patient status for this patient is OBSERVATION. Observation status is judged to be reasonable and necessary in order to provide the required intensity of service to ensure the patient's safety. The patient's presenting symptoms, physical exam findings, and initial radiographic and laboratory data in the context of their medical condition is felt to place them at decreased risk for further clinical deterioration. Furthermore, it is anticipated that the patient will be medically stable for discharge from the hospital within 2 midnights  of admission. The following factors support the patient status of observation.   " The patient's presenting symptoms include lightheadedness. " The physical exam findings include no overt signs or symptoms of heart failure, no current orthostasis. " The initial radiographic and laboratory data are normal CXR, ECG with frequent PVCs.     For questions or updates, please contact [facility] Please consult [email or web address] for contact info under Cardiology/STEMI.     Signed, Lujean Amel, MD  09/04/2017 8:56 PM

## 2017-09-04 NOTE — Telephone Encounter (Signed)
   Pts wife called this am to report that he has been having some lightheadedness the past few days and it is worse this AM.  He is also feeling weak.  She just checked his BP and it was 100/45 with a HR of 31.  I rec that she call 911 for formal evaluation and hold his amlodipine and clonidine this AM.  Caller verbalized understanding and was grateful for the call back.  Murray Hodgkins, NP 09/04/2017, 10:50 AM

## 2017-09-05 ENCOUNTER — Other Ambulatory Visit: Payer: Self-pay | Admitting: Cardiology

## 2017-09-05 ENCOUNTER — Encounter (HOSPITAL_COMMUNITY): Payer: Self-pay

## 2017-09-05 DIAGNOSIS — I129 Hypertensive chronic kidney disease with stage 1 through stage 4 chronic kidney disease, or unspecified chronic kidney disease: Secondary | ICD-10-CM | POA: Diagnosis not present

## 2017-09-05 DIAGNOSIS — N183 Chronic kidney disease, stage 3 (moderate): Secondary | ICD-10-CM | POA: Diagnosis not present

## 2017-09-05 DIAGNOSIS — R42 Dizziness and giddiness: Secondary | ICD-10-CM | POA: Diagnosis not present

## 2017-09-05 DIAGNOSIS — I4510 Unspecified right bundle-branch block: Secondary | ICD-10-CM | POA: Diagnosis not present

## 2017-09-05 DIAGNOSIS — E1122 Type 2 diabetes mellitus with diabetic chronic kidney disease: Secondary | ICD-10-CM | POA: Diagnosis not present

## 2017-09-05 DIAGNOSIS — R001 Bradycardia, unspecified: Secondary | ICD-10-CM | POA: Diagnosis not present

## 2017-09-05 DIAGNOSIS — I493 Ventricular premature depolarization: Secondary | ICD-10-CM | POA: Diagnosis not present

## 2017-09-05 LAB — BASIC METABOLIC PANEL WITH GFR
Anion gap: 6 (ref 5–15)
BUN: 23 mg/dL — ABNORMAL HIGH (ref 6–20)
CO2: 29 mmol/L (ref 22–32)
Calcium: 8.4 mg/dL — ABNORMAL LOW (ref 8.9–10.3)
Chloride: 103 mmol/L (ref 101–111)
Creatinine, Ser: 1.47 mg/dL — ABNORMAL HIGH (ref 0.61–1.24)
GFR calc Af Amer: 51 mL/min — ABNORMAL LOW
GFR calc non Af Amer: 44 mL/min — ABNORMAL LOW
Glucose, Bld: 121 mg/dL — ABNORMAL HIGH (ref 65–99)
Potassium: 4.5 mmol/L (ref 3.5–5.1)
Sodium: 138 mmol/L (ref 135–145)

## 2017-09-05 LAB — CBC
HEMATOCRIT: 32.2 % — AB (ref 39.0–52.0)
Hemoglobin: 10.4 g/dL — ABNORMAL LOW (ref 13.0–17.0)
MCH: 30.5 pg (ref 26.0–34.0)
MCHC: 32.3 g/dL (ref 30.0–36.0)
MCV: 94.4 fL (ref 78.0–100.0)
PLATELETS: 117 10*3/uL — AB (ref 150–400)
RBC: 3.41 MIL/uL — ABNORMAL LOW (ref 4.22–5.81)
RDW: 13 % (ref 11.5–15.5)
WBC: 4.7 10*3/uL (ref 4.0–10.5)

## 2017-09-05 LAB — GLUCOSE, CAPILLARY
GLUCOSE-CAPILLARY: 155 mg/dL — AB (ref 65–99)
Glucose-Capillary: 119 mg/dL — ABNORMAL HIGH (ref 65–99)

## 2017-09-05 LAB — MAGNESIUM: Magnesium: 2.3 mg/dL (ref 1.7–2.4)

## 2017-09-05 MED ORDER — APIXABAN 5 MG PO TABS
5.0000 mg | ORAL_TABLET | Freq: Two times a day (BID) | ORAL | Status: DC
  Administered 2017-09-05: 5 mg via ORAL
  Filled 2017-09-05: qty 1

## 2017-09-05 NOTE — Discharge Summary (Signed)
Discharge Summary    Patient ID: Thomas Marshall,  MRN: [id number], DOB/AGE: [birth year] 76 y.o.  Admit date: 09/04/2017 Discharge date: 09/05/2017  Primary Care Provider: [name] Primary Cardiologist: Dr. Burt Knack Primary electrophysiologist: new- [name] saw pt this admission  Discharge Diagnoses    Principal Problem:   Bradycardia Active Problems:   Long term current use of anticoagulant therapy   CKD (chronic kidney disease) stage 3, GFR 30-59 ml/min (HCC)   CAD (coronary artery disease)   Essential hypertension   Hypomagnesemia   Persistent atrial fibrillation (HCC)   PVC's (premature ventricular contractions)   Allergies Allergies  Allergen Reactions  . Lisinopril Swelling and Other (See Comments)    Angioedema.   . Losartan Swelling and Other (See Comments)    Lips swell Angioedema   . Nifedipine Swelling    Sugar increase  . Triamterene Swelling  . Hydralazine Hcl Other (See Comments)    Fatigue; poor appetite  . Amoxicillin Rash    Has patient had a PCN reaction causing immediate rash, facial/tongue/throat swelling, SOB or lightheadedness with hypotension:No Has patient had a PCN reaction causing severe rash involving mucus membranes or skin necrosis:No Has patient had a PCN reaction that required hospitalization:No Has patient had a PCN reaction occurring within the last 10 years: No If all of the above answers are "NO", then may proceed with Cephalosporin use.   . Ampicillin Rash    Has patient had a PCN reaction causing immediate rash, facial/tongue/throat swelling, SOB or lightheadedness with hypotension:No Has patient had a PCN reaction causing severe rash involving mucus membranes or skin necrosis:No Has patient had a PCN reaction that required hospitalization:No Has patient had a PCN reaction occurring within the last 10 years: No If all of the above answers are "NO", then may proceed with Cephalosporin use.    . Carvedilol Other (See  Comments)    Low heart rate     Diagnostic Studies/Procedures    None _____________   History of Present Illness     DRAGON THRUSH is a 76 y.o. male with a hx of CAD s/p CABG in 2004 and DES to SVG-ramus intermedius 04/2017, AAA, PAF on dofetilide, R CEA 12/2015, DM and known sinus bradycardia who presented with a 2-3 day history of lightheadedness and feeling woozy. At home his wife took his BP which was 100/44 with a heart arte of 31. This prompted the patient to go to the ED where he was noted to be in sinus rhythm with Frequent PVC's.   Hospital Course     Consultants: [name] for electrophysiology  1. Frequent PVCs with bradycardia - It was unclear if the patient had had true bradycardia or if the low HR just represented pseudobradycardia from frequent ectopy. His baseline sinus HR was in the upper 40s-60s here. The patients electrolytes were normal and there was no clear etiology for his ectopy. Admitting fellow Dr. Delsa Sale raised question of need for PPM to allow for ability to use beta blocker for suppression. [name] was consulted for electrophysiology. [name] noted that the main question was the total number of PVCs in 24 hours. He discussed treatment options in detail with the patient including ablation, switching to amiodarone, or placement of a pacemaker with up titration of his beta blocker, versus watchful waiting and reassurance. For now he recommends the latter. A 24 hr holter has been ordered for outpatient. If he has more than 20,000 PVCs in a 24-hour period,  [name] suggests a more aggressive approach including consideration for catheter ablation. The patient will follow up with Dr. Burt Knack after the monitor results and then consider whether to refer back to EP.   2. The patient has a history of CAD and has denied anginal symptoms. Continuing current medical therapy. He is maintained on Plavix without aspirin. He has chronic anemia and thrombocytopenia which appeared  similar to prior, as this seems to flucutate. No bleeding reported. This should be monitored as OP.  3. History of PAF. The patient is maintaining sinus rhythm. He will continue dofetilide therapy. His QT interval is not prolonged. Dr Lovena Le discussed with the patient switching him to amiodarone which might do a better job controlling PVCs and atrial fibrillation but he notes that he had taken this medication previously and was not real enthusiastic about stopping dofetilide and this is reasonable. His hypertension is well controlled. Continue current therapy.   4. CKD stage III - His SCr is 1.47 which is around baseline, on the lower end for him.   5. Hypomagnesemia - Magnesium level 1.4 on admission. Was given IV magnesium. Current mag level 2.3  The patient was observed through the afternoon without any adverse effects. HR remained stable from 48-60, asymptomatic. He ambulated without difficulty. Thomas Marshall, original [place] [employer] of this discharge summary, sent message to office to arrange holter monitor. [name] has seen and examined the patient today and feels he is stable for discharge.  _____________  Discharge Vitals Blood pressure 103/84, pulse (!) 57, temperature 97.6 F (36.4 C), temperature source Oral, resp. rate 20, height 6\' 2"  (1.88 m), weight 213 lb 9.6 oz (96.9 kg), SpO2 98 %.  Filed Weights   09/04/17 1916 09/05/17 0541  Weight: 220 lb 6.4 oz (100 kg) 213 lb 9.6 oz (96.9 kg)    Labs & Radiologic Studies    CBC  Recent Labs  09/05/17 0504  WBC 4.7  HGB 10.4*  HCT 32.2*  MCV 94.4  PLT 440*   Basic Metabolic Panel  Recent Labs  09/04/17 1953 09/05/17 0504  NA 138 138  K 4.4 4.5  CL 104 103  CO2 27 29  GLUCOSE 179* 121*  BUN 27* 23*  CREATININE 1.57* 1.47*  CALCIUM 8.5* 8.4*  MG 1.4*  --    Liver Function Tests  Recent Labs  09/04/17 1953  AST 19  ALT 14*  ALKPHOS 60  BILITOT 0.9  PROT 5.9*  ALBUMIN 3.5    _____________   Disposition    Pt is being discharged home today in good condition.  Follow-up Plans & Appointments    Follow-up Information    [name], MD Follow up.   Specialty:  Cardiology Why:  Keep your appointment with Dr. Burt Knack on October 25th at 8:40.  The office will call you to arrange for an outpatient 24 hours heart monitor. Contact information: [address] [place] [address] [phone]            Discharge Medications   Allergies as of 09/05/2017      Reactions   Lisinopril Swelling, Other (See Comments)   Angioedema.    Losartan Swelling, Other (See Comments)   Lips swell Angioedema   Nifedipine Swelling   Sugar increase   Triamterene Swelling   Hydralazine Hcl Other (See Comments)   Fatigue; poor appetite   Amoxicillin Rash   Has patient had a PCN reaction causing immediate rash, facial/tongue/throat swelling, SOB or lightheadedness with hypotension:No Has  patient had a PCN reaction causing severe rash involving mucus membranes or skin necrosis:No Has patient had a PCN reaction that required hospitalization:No Has patient had a PCN reaction occurring within the last 10 years: No If all of the above answers are "NO", then may proceed with Cephalosporin use.   Ampicillin Rash   Has patient had a PCN reaction causing immediate rash, facial/tongue/throat swelling, SOB or lightheadedness with hypotension:No Has patient had a PCN reaction causing severe rash involving mucus membranes or skin necrosis:No Has patient had a PCN reaction that required hospitalization:No Has patient had a PCN reaction occurring within the last 10 years: No If all of the above answers are "NO", then may proceed with Cephalosporin use.   Carvedilol Other (See Comments)   Low heart rate      Medication List    TAKE these medications   amLODipine 10 MG tablet Commonly known as:  NORVASC Take 1 tablet (10 mg total) by mouth daily.   atorvastatin 40 MG tablet Commonly  known as:  LIPITOR Take 40 mg by mouth at bedtime.   cholecalciferol 1000 units tablet Commonly known as:  VITAMIN D Take 1,000 Units by mouth every evening.   cloNIDine 0.1 MG tablet Commonly known as:  CATAPRES TAKE ONE TABLET BY MOUTH TWICE DAILY   clopidogrel 75 MG tablet Commonly known as:  PLAVIX Take 1 tablet (75 mg total) by mouth daily.   dofetilide 250 MCG capsule Commonly known as:  TIKOSYN Take 1 capsule (250 mcg total) by mouth 2 (two) times daily.   ELIQUIS 5 MG Tabs tablet Generic drug:  apixaban Take 5 mg by mouth 2 (two) times daily. (0800 & 2000)   glucose blood test strip Use to test blood glucose level 2 times a day.   magnesium oxide 400 MG tablet Commonly known as:  MAG-OX Take 1 tablet (400 mg total) by mouth 4 (four) times daily.   metFORMIN 1000 MG tablet Commonly known as:  GLUCOPHAGE Take 1 tablet (1,000 mg total) by mouth 2 (two) times daily with a meal.   nitroGLYCERIN 0.4 MG SL tablet Commonly known as:  NITROSTAT Place 1 tablet (0.4 mg total) under the tongue every 5 (five) minutes as needed for chest pain (x 3 doses).   pantoprazole 40 MG tablet Commonly known as:  PROTONIX Take 1 tablet (40 mg total) by mouth daily.   terazosin 1 MG capsule Commonly known as:  HYTRIN Take 1 mg by mouth at bedtime. (2100)   triamcinolone cream 0.1 % Commonly known as:  KENALOG Apply 1 application topically 2 (two) times daily.   vitamin C 500 MG tablet Commonly known as:  ASCORBIC ACID Take 500 mg by mouth every evening. (2100)           Outstanding Labs/Studies   None  Duration of Discharge Encounter   Greater than 30 minutes including physician time.  Signed, Thomas Perch NP 09/05/2017, 12:14 PM  Addendum by Charlie Pitter, PA-C 3:26 PM  EP Attending  Patient seen and examined. Agree with above. He is stable for dc home. See my consult note earlier for more details. Followup with Dr. Burt Knack is scheduled. Will check 24 hour  holter and make additional decisions about the treatment of his PVC's based on the holter.   Mikle Bosworth.D.

## 2017-09-05 NOTE — Progress Notes (Signed)
Patient ambulated entire hallway and had no complaints of chest discomfort or palpitations.

## 2017-09-05 NOTE — Consult Note (Signed)
EP Consultation:   Patient ID: Thomas Marshall; [id number]; 11/21/1941   Admit date: 09/04/2017 Date of Consult: 09/05/2017  Primary Care Provider: [name], MD Primary Cardiologist: Burt Knack Primary Electrophysiologist:  new   Patient Profile:   Thomas Marshall is a 76 y.o. male with a hx of CAD, s/p CABG who is being seen today for the evaluation of bradycardia, fatigue and PVC at the request of Dr. Johnsie Cancel.  History of Present Illness:   Thomas Marshall is a very pleasant 76 year old man with known coronary artery disease, abdominal aortic aneurysm, paroxysmal atrial fibrillation on dofetilide, and known sinus bradycardia, who experienced an episode of dizziness and weakness, and his blood pressure machine registered a heart rate of 31 beats a minute. He presented to the emergency department where he was found to be in sinus rhythm in the 60s with bigeminal PVCs and is admitted for additional evaluation. His anticoagulation was held. He has not had frank syncope. He has minimal palpitations.  Past Medical History:  Diagnosis Date  . AAA (abdominal aortic aneurysm) ([place])   . Arthritis   . Cancer (HCC)    skin & squamous cell  . Carotid artery disease ([place])    a. s/p R CEA 12/2015.  [name] CKD (chronic kidney disease), stage III ([place])    a. per historical labs.  . Coronary artery disease    a. s/p CABG 2004.  . Diabetes mellitus without complication ([place])   . GERD (gastroesophageal reflux disease)   . Hyperlipidemia   . Hypertension   . Medication intolerance Multiple   . PAF (paroxysmal atrial fibrillation) ([place])   . Pneumonia   . Sinus bradycardia    a. baseline HR 50s-60s, also h/o bradycardia on metoprolol and carvedilol    Past Surgical History:  Procedure Laterality Date  . ANKLE SURGERY     right fused  . BACK SURGERY     low back   . CHOLECYSTECTOMY N/A 12/07/2013   Procedure: LAPAROSCOPIC CHOLECYSTECTOMY ;  Surgeon: [name], MD;  Location: [facility];  Service: General;   Laterality: N/A;  . COLONOSCOPY    . CORONARY ARTERY BYPASS GRAFT  2004   [place]  . CORONARY STENT INTERVENTION N/A 05/21/2017   Procedure: Coronary Stent Intervention;  Surgeon: [name], MD;  Location: [facility];  Service: Cardiovascular;  Laterality: N/A;  . ENDARTERECTOMY Right 12/10/2015   Procedure: Right Carotid ENDARTERECTOMY;  Surgeon: Conrad Lake Delton, MD;  Location: [facility];  Service: Vascular;  Laterality: Right;  . EXPLORATION POST OPERATIVE OPEN HEART    . FINGER SURGERY     skin graft on rt index  . FINGER SURGERY Right    right index finger.  [name] HERNIA REPAIR Right    RIH  . JOINT REPLACEMENT    . KNEE SURGERY     replacement on both knees  . LEFT HEART CATH AND CORS/GRAFTS ANGIOGRAPHY N/A 05/21/2017   Procedure: Left Heart Cath and Cors/Grafts Angiography;  Surgeon: [name], MD;  Location: [facility];  Service: Cardiovascular;  Laterality: N/A;  . PROSTATE SURGERY    . TONSILLECTOMY    . URINARY SURGERY     scar tissue      Inpatient Medications: Scheduled Meds: . amLODipine  10 mg Oral Daily  . atorvastatin  40 mg Oral QHS  . cholecalciferol  1,000 Units Oral QPM  . cloNIDine  0.1 mg Oral BID  . clopidogrel  75 mg Oral Daily  .  dofetilide  250 mcg Oral BID  . insulin aspart  0-15 Units Subcutaneous TID WC  . magnesium oxide  400 mg Oral QID  . pantoprazole  40 mg Oral Daily  . terazosin  1 mg Oral QHS  . vitamin C  500 mg Oral QPM   Continuous Infusions:  PRN Meds: acetaminophen, nitroGLYCERIN, ondansetron (ZOFRAN) IV  Allergies:    Allergies  Allergen Reactions  . Lisinopril Swelling and Other (See Comments)    Angioedema.   . Losartan Swelling and Other (See Comments)    Lips swell Angioedema   . Nifedipine Swelling    Sugar increase  . Triamterene Swelling  . Hydralazine Hcl Other (See Comments)    Fatigue; poor appetite  . Amoxicillin Rash    Has patient had a PCN reaction causing immediate rash,  facial/tongue/throat swelling, SOB or lightheadedness with hypotension:No Has patient had a PCN reaction causing severe rash involving mucus membranes or skin necrosis:No Has patient had a PCN reaction that required hospitalization:No Has patient had a PCN reaction occurring within the last 10 years: No If all of the above answers are "NO", then may proceed with Cephalosporin use.   . Ampicillin Rash    Has patient had a PCN reaction causing immediate rash, facial/tongue/throat swelling, SOB or lightheadedness with hypotension:No Has patient had a PCN reaction causing severe rash involving mucus membranes or skin necrosis:No Has patient had a PCN reaction that required hospitalization:No Has patient had a PCN reaction occurring within the last 10 years: No If all of the above answers are "NO", then may proceed with Cephalosporin use.    . Carvedilol Other (See Comments)    Low heart rate     Social History:   Social History   Social History  . Marital status: Married    Spouse name: N/A  . Number of children: N/A  . Years of education: N/A   Occupational History  . Not on file.   Social History Main Topics  . Smoking status: Former Smoker    Packs/day: 1.00    Years: 13.00    Quit date: 11/30/1970  . Smokeless tobacco: Never Used  . Alcohol use No  . Drug use: No  . Sexual activity: Yes   Other Topics Concern  . Not on file   Social History Narrative  . No narrative on file    Family History:    Family History  Problem Relation Age of Onset  . Stroke Mother   . Heart disease Mother   . Heart disease Father   . Hypertension Father   . Hyperlipidemia Father   . Cancer Sister        breast  and skin  . Diabetes Sister   . Hypertension Sister   . Diabetes Brother   . Heart disease Brother   . Hyperlipidemia Brother   . Hypertension Brother   . Early death Neg Hx      ROS:  Please see the history of present illness.  ROS  All other ROS reviewed and  negative.     Physical Exam/Data:   Vitals:   09/04/17 1916 09/04/17 1920 09/05/17 0056 09/05/17 0541  BP:  (!) 151/78 (!) 110/48 103/84  Pulse:  88 (!) 53 (!) 57  Resp:  20 20 20   Temp:   98 F (36.7 C) 97.6 F (36.4 C)  TempSrc:  Oral Oral Oral  SpO2:  98% 98% 98%  Weight: 220 lb 6.4 oz (100 kg)   213  lb 9.6 oz (96.9 kg)  Height: 6\' 2"  (1.88 m)       Intake/Output Summary (Last 24 hours) at 09/05/17 1139 Last data filed at 09/05/17 0947  Gross per 24 hour  Intake              480 ml  Output              800 ml  Net             -320 ml   Filed Weights   09/04/17 1916 09/05/17 0541  Weight: 220 lb 6.4 oz (100 kg) 213 lb 9.6 oz (96.9 kg)   Body mass index is 27.42 kg/m.  General:  Well nourished, well developed, in no acute distress HEENT: normal Lymph: no adenopathy Neck: no JVD Endocrine:  No thryomegaly Vascular: No carotid bruits; FA pulses 2+ bilaterally without bruits  Cardiac:  normal S1, S2; IRRR; no murmur  Lungs:  clear to auscultation bilaterally, no wheezing, rhonchi or rales  Abd: soft, nontender, no hepatomegaly  Ext: no edema Musculoskeletal:  No deformities, BUE and BLE strength normal and equal Skin: warm and dry  Neuro:  CNs 2-12 intact, no focal abnormalities noted Psych:  Normal affect   EKG:  The EKG was personally reviewed and demonstrates:  nsr with PVC's Telemetry:  Telemetry was personally reviewed and demonstrates:  NSR with PVC's  Relevant CV Studies: none  Laboratory Data:  Chemistry Recent Labs Lab 09/04/17 1953 09/05/17 0504  NA 138 138  K 4.4 4.5  CL 104 103  CO2 27 29  GLUCOSE 179* 121*  BUN 27* 23*  CREATININE 1.57* 1.47*  CALCIUM 8.5* 8.4*  GFRNONAA 41* 44*  GFRAA 47* 51*  ANIONGAP 7 6     Recent Labs Lab 09/04/17 1953  PROT 5.9*  ALBUMIN 3.5  AST 19  ALT 14*  ALKPHOS 60  BILITOT 0.9   Hematology Recent Labs Lab 09/05/17 0504  WBC 4.7  RBC 3.41*  HGB 10.4*  HCT 32.2*  MCV 94.4  MCH 30.5  MCHC  32.3  RDW 13.0  PLT 117*   Cardiac EnzymesNo results for input(s): TROPONINI in the last 168 hours. No results for input(s): TROPIPOC in the last 168 hours.  BNPNo results for input(s): BNP, PROBNP in the last 168 hours.  DDimer No results for input(s): DDIMER in the last 168 hours.  Radiology/Studies:  No results found.  Assessment and Plan:   1. PVCs - main question is the total number of PVCs and 24 hours. We discussed his treatment options in detail including ablation, switching to amiodarone, or placement of a pacemaker with up titration of his beta blocker, versus watchful waiting and reassurance. For now we will try the latter. He will follow-up with Dr. Burt Knack after his 24-hour Holter. Additional recommendations will be based on the results of this Holter monitor. If he has more than 20,000 PVCs in a 24-hour period, I would suggest a more aggressive approach including consideration for catheter ablation. 2. Coronary artery disease status post bypass surgery - he denies anginal symptoms and appears to be doing well. No change in medical therapy at this time 3. Paroxysmal atrial fibrillation - he will continue dofetilide therapy. His QT interval is not prolonged. We did discuss switching him to amiodarone which might do a better job controlling PVCs and atrial fibrillation but he notes that he had taken this medication previously and was not real enthusiastic about stopping dofetilide and I concur. 4. Hypertension -  his blood pressure is currently reasonably well controlled.   For questions or updates, please contact [facility] Please consult [email or web address] for contact info under Cardiology/STEMI.   Signed, Cristopher Peru, MD  09/05/2017 11:39 AM

## 2017-09-05 NOTE — Progress Notes (Signed)
Patient given discharge instructions and all questions answered.  Patient discharged via wheelchair with all belongings.   

## 2017-09-07 ENCOUNTER — Telehealth: Payer: Self-pay | Admitting: Internal Medicine

## 2017-09-07 NOTE — Telephone Encounter (Signed)
New message   Pt is calling asking for a call back about the holter monitor he is needs to schedule. Please call.

## 2017-09-07 NOTE — Telephone Encounter (Signed)
Call returned to Pt.  Will send to scheduling pool.  Pt needs 24 hour holter monitor scheduled prior to follow up with Dr. Burt Knack October 25.  When attempted to place order, system states there is an existing signed order.

## 2017-09-14 ENCOUNTER — Ambulatory Visit (INDEPENDENT_AMBULATORY_CARE_PROVIDER_SITE_OTHER): Payer: Medicare Other | Admitting: Podiatry

## 2017-09-14 ENCOUNTER — Encounter: Payer: Self-pay | Admitting: Podiatry

## 2017-09-14 DIAGNOSIS — Q828 Other specified congenital malformations of skin: Secondary | ICD-10-CM

## 2017-09-14 DIAGNOSIS — M79675 Pain in left toe(s): Secondary | ICD-10-CM

## 2017-09-14 DIAGNOSIS — E1140 Type 2 diabetes mellitus with diabetic neuropathy, unspecified: Secondary | ICD-10-CM

## 2017-09-14 DIAGNOSIS — B351 Tinea unguium: Secondary | ICD-10-CM | POA: Diagnosis not present

## 2017-09-14 DIAGNOSIS — M79674 Pain in right toe(s): Secondary | ICD-10-CM | POA: Diagnosis not present

## 2017-09-14 NOTE — Progress Notes (Signed)
Patient ID: Thomas Marshall, male   DOB: 08/08/1941, 76 y.o.   MRN: [id number]  Subjective: 76 y.o. returns the office today for painful, elongated, thickened toenails which he cannot trim himself.  No redness or swelling. Denies any acute changes since last appointment and no new complaints today. Denies any systemic complaints such as fevers, chills, nausea, vomiting.   He recently underwent cardiac stenting  Objective: AAO 3, NAD DP/PT pulses palpable, CRT less than 3 seconds Protective sensation decreased with Simms Weinstein monofilament Nails hypertrophic, dystrophic, elongated, brittle, discolored 10. There is tenderness overlying the nails 1-5 bilaterally. There is no surrounding erythema or drainage along the nail sites. Small hyperkeratotic lesion right fifth metatarsal base. No underlying ulceration, drainage or any signs of infection. No significant hyperkeratotic lesion right fifth metatarsal base.  No other areas of tenderness bilateral lower extremities. No overlying edema, erythema, increased warmth. No pain with calf compression, swelling, warmth, erythema.  Assessment: Patient presents with symptomatic onychomycosis; hyperkeratotic tissue  Plan: -Treatment options including alternatives, risks, complications were discussed -Nails sharply debrided 10 without complication/bleeding. -hyperkeratotic lesion sharply debrided 1 without any complications or bleeding. -Discussed daily foot inspection. If there are any changes, to call the office immediately.  -Follow-up in 3 months or sooner if any problems are to arise. In the meantime, encouraged to call the office with any questions, concerns, changes symptoms.  Celesta Gentile, DPM

## 2017-09-15 NOTE — Progress Notes (Signed)
Subjective:   Thomas Marshall is a 76 y.o. male who presents for an Initial Medicare Annual Wellness Visit.  Review of Systems  No ROS.  Medicare Wellness Visit. Additional risk factors are reflected in the social history.  Cardiac Risk Factors include: advanced age (>33men, >64 women);diabetes mellitus;dyslipidemia;hypertension;male gender Sleep patterns: gets up 1-2 times nightly to void and sleeps 7-8 hours nightly.    Home Safety/Smoke Alarms: Feels safe in home. Smoke alarms in place.  Living environment; residence and Firearm Safety: 2-story house, no firearms, Lives with wife, no needs for DME, good support system. Seat Belt Safety/Bike Helmet: Wears seat belt.   Objective:    Today's Vitals   09/16/17 0949  BP: 134/62  Pulse: (!) 53  Temp: 97.7 F (36.5 C)  TempSrc: Oral  SpO2: 98%  Weight: 219 lb (99.3 kg)  Height: 6\' 2"  (1.88 m)   Body mass index is 28.12 kg/m.  Current Medications (verified) Outpatient Encounter Prescriptions as of 09/16/2017  Medication Sig  . amLODipine (NORVASC) 10 MG tablet Take 1 tablet (10 mg total) by mouth daily.  [name] apixaban (ELIQUIS) 5 MG TABS tablet Take 5 mg by mouth 2 (two) times daily. (0800 & 2000)  . atorvastatin (LIPITOR) 40 MG tablet Take 40 mg by mouth at bedtime.   . cholecalciferol (VITAMIN D) 1000 UNITS tablet Take 1,000 Units by mouth every evening.   . cloNIDine (CATAPRES) 0.1 MG tablet TAKE ONE TABLET BY MOUTH TWICE DAILY  . clopidogrel (PLAVIX) 75 MG tablet Take 1 tablet (75 mg total) by mouth daily.  [name] dofetilide (TIKOSYN) 250 MCG capsule Take 1 capsule (250 mcg total) by mouth 2 (two) times daily.  [name] glucose blood test strip Use to test blood glucose level 2 times a day.  . magnesium oxide (MAG-OX) 400 MG tablet Take 1 tablet (400 mg total) by mouth 4 (four) times daily.  . metFORMIN (GLUCOPHAGE) 1000 MG tablet Take 1 tablet (1,000 mg total) by mouth 2 (two) times daily with a meal.  . nitroGLYCERIN (NITROSTAT) 0.4 MG  SL tablet Place 1 tablet (0.4 mg total) under the tongue every 5 (five) minutes as needed for chest pain (x 3 doses).  . pantoprazole (PROTONIX) 40 MG tablet Take 1 tablet (40 mg total) by mouth daily.  [name] terazosin (HYTRIN) 1 MG capsule Take 1 mg by mouth at bedtime. (2100)  . triamcinolone cream (KENALOG) 0.1 % Apply 1 application topically 2 (two) times daily.  . vitamin C (ASCORBIC ACID) 500 MG tablet Take 500 mg by mouth every evening. (2100)   No facility-administered encounter medications on file as of 09/16/2017.     Allergies (verified) Lisinopril; Losartan; Nifedipine; Triamterene; Hydralazine hcl; Amoxicillin; Ampicillin; and Carvedilol   History: Past Medical History:  Diagnosis Date  . AAA (abdominal aortic aneurysm) ([facility])   . Arthritis   . Cancer (HCC)    skin & squamous cell  . Carotid artery disease ([place])    a. s/p R CEA 12/2015.  [name] CKD (chronic kidney disease), stage III ([facility])    a. per historical labs.  . Coronary artery disease    a. s/p CABG 2004.  . Diabetes mellitus without complication ([facility])   . GERD (gastroesophageal reflux disease)   . Hyperlipidemia   . Hypertension   . Medication intolerance Multiple   . PAF (paroxysmal atrial fibrillation) ([place])   . Pneumonia   . Sinus bradycardia    a. baseline HR 50s-60s, also h/o bradycardia on metoprolol and carvedilol  Past Surgical History:  Procedure Laterality Date  . ANKLE SURGERY     right fused  . BACK SURGERY     low back   . CHOLECYSTECTOMY N/A 12/07/2013   Procedure: LAPAROSCOPIC CHOLECYSTECTOMY ;  Surgeon: [name], MD;  Location: [facility];  Service: General;  Laterality: N/A;  . COLONOSCOPY    . CORONARY ARTERY BYPASS GRAFT  2004   [place]  . CORONARY STENT INTERVENTION N/A 05/21/2017   Procedure: Coronary Stent Intervention;  Surgeon: [name], MD;  Location: [facility];  Service: Cardiovascular;  Laterality: N/A;  . ENDARTERECTOMY Right 12/10/2015   Procedure: Right  Carotid ENDARTERECTOMY;  Surgeon: Conrad Northway, MD;  Location: [facility];  Service: Vascular;  Laterality: Right;  . EXPLORATION POST OPERATIVE OPEN HEART    . FINGER SURGERY     skin graft on rt index  . FINGER SURGERY Right    right index finger.  [name] HERNIA REPAIR Right    RIH  . JOINT REPLACEMENT    . KNEE SURGERY     replacement on both knees  . LEFT HEART CATH AND CORS/GRAFTS ANGIOGRAPHY N/A 05/21/2017   Procedure: Left Heart Cath and Cors/Grafts Angiography;  Surgeon: [name], MD;  Location: [facility];  Service: Cardiovascular;  Laterality: N/A;  . PROSTATE SURGERY    . TONSILLECTOMY    . URINARY SURGERY     scar tissue   Family History  Problem Relation Age of Onset  . Stroke Mother   . Heart disease Mother   . Heart disease Father   . Hypertension Father   . Hyperlipidemia Father   . Cancer Sister        breast  and skin  . Diabetes Sister   . Hypertension Sister   . Diabetes Brother   . Heart disease Brother   . Hyperlipidemia Brother   . Hypertension Brother   . Early death Neg Hx    Social History   Occupational History  . Not on file.   Social History Main Topics  . Smoking status: Former Smoker    Packs/day: 1.00    Years: 13.00    Quit date: 11/30/1970  . Smokeless tobacco: Never Used  . Alcohol use No  . Drug use: No  . Sexual activity: Yes   Tobacco Counseling Counseling given: Not Answered   Activities of Daily Living In your present state of health, do you have any difficulty performing the following activities: 09/16/2017 09/04/2017  Hearing? N N  Vision? N N  Difficulty concentrating or making decisions? N N  Walking or climbing stairs? N N  Dressing or bathing? N N  Doing errands, shopping? N N  Preparing Food and eating ? N -  Using the Toilet? N -  In the past six months, have you accidently leaked urine? N -  Do you have problems with loss of bowel control? N -  Managing your Medications? N -  Managing your Finances? N  -  Housekeeping or managing your Housekeeping? N -  Some recent data might be hidden    Immunizations and Health Maintenance Immunization History  Administered Date(s) Administered  . Influenza, High Dose Seasonal PF 09/08/2013, 07/22/2017  . Influenza,inj,Quad PF,6+ Mos 08/20/2014, 08/13/2015, 07/31/2016  . Pneumococcal Conjugate-13 09/10/2014  . Pneumococcal Polysaccharide-23 05/22/2016  . Tdap 04/03/2014   There are no preventive care reminders to display for this patient.  Patient Care Team: [name], MD as PCP - General (  Internal Medicine) [name], MD as Consulting Physician (Cardiology) [name], MD as Consulting Physician (General Surgery)  Indicate any recent Medical Services you may have received from other than [facility] providers in the past year (date may be approximate).    Assessment:   This is a routine wellness examination for [place]. Physical assessment deferred to PCP.   Hearing/Vision screen Hearing Screening Comments: Able to hear conversational tones w/o difficulty. No issues reported.  Passed whisper test  Vision Screening Comments: [facility] 6 months [facility]  Dietary issues and exercise activities discussed: Current Exercise Habits: Home exercise routine, Type of exercise: walking (maintains large yard), Time (Minutes): 30, Frequency (Times/Week): 6, Weekly Exercise (Minutes/Week): 180, Intensity: Mild, Exercise limited by: cardiac condition(s)  Diet (meal preparation, eat out, water intake, caffeinated beverages, dairy products, fruits and vegetables): in general, a "healthy" diet  , well balanced, eats a variety of fruits and vegetables daily, limits salt, fat/cholesterol, sugar, caffeine, drinks 2-3 glasses of water daily.  Encouraged patient to increase daily water intake.      Goals    . Get back to walking as soon as I get my heart situated          I want to get back to mowing my yard asap      Depression Screen PHQ 2/9  Scores 09/16/2017 03/16/2017 01/21/2016 08/06/2015  PHQ - 2 Score 1 0 0 0  PHQ- 9 Score 1 - - -    Fall Risk Fall Risk  09/16/2017 09/16/2017 03/16/2017 05/22/2016 08/06/2015  Falls in the past year? No No No Yes No  Injury with Fall? - - - Yes -    Cognitive Function:       Ad8 score reviewed for issues:  Issues making decisions: no  Less interest in hobbies / activities: no  Repeats questions, stories (family complaining): no  Trouble using ordinary gadgets (microwave, computer, phone):no  Forgets the month or year: no  Mismanaging finances: no  Remembering appts: no  Daily problems with thinking and/or memory: no Ad8 score is= 0  Screening Tests Health Maintenance  Topic Date Due  . OPHTHALMOLOGY EXAM  01/28/2018  . FOOT EXAM  03/16/2018  . URINE MICROALBUMIN  03/16/2018  . TETANUS/TDAP  04/03/2024  . INFLUENZA VACCINE  Completed  . PNA vac Low Risk Adult  Completed        Plan:  Continue doing brain stimulating activities (puzzles, reading, adult coloring books, staying active) to keep memory sharp.   Continue to eat heart healthy diet (full of fruits, vegetables, whole grains, lean protein, water--limit salt, fat, and sugar intake) and increase physical activity as tolerated.  I have personally reviewed and noted the following in the patient's chart:   . Medical and social history . Use of alcohol, tobacco or illicit drugs  . Current medications and supplements . Functional ability and status . Nutritional status . Physical activity . Advanced directives . List of other physicians . Vitals . Screenings to include cognitive, depression, and falls . Referrals and appointments  In addition, I have reviewed and discussed with patient certain preventive protocols, quality metrics, and best practice recommendations. A written personalized care plan for preventive services as well as general preventive health recommendations were provided to patient.      Michiel Cowboy, RN   09/16/2017   Medical screening examination/treatment/procedure(s) were performed by non-physician practitioner and as supervising physician I was immediately available for consultation/collaboration. I agree with above. Michiel Cowboy, RN

## 2017-09-15 NOTE — Progress Notes (Signed)
Pre visit review using our clinic review tool, if applicable. No additional management support is needed unless otherwise documented below in the visit note. 

## 2017-09-16 ENCOUNTER — Encounter: Payer: Self-pay | Admitting: Internal Medicine

## 2017-09-16 ENCOUNTER — Ambulatory Visit (INDEPENDENT_AMBULATORY_CARE_PROVIDER_SITE_OTHER): Payer: Medicare Other | Admitting: Internal Medicine

## 2017-09-16 VITALS — BP 134/62 | HR 53 | Temp 97.7°F | Ht 74.0 in | Wt 219.0 lb

## 2017-09-16 DIAGNOSIS — N182 Chronic kidney disease, stage 2 (mild): Secondary | ICD-10-CM | POA: Diagnosis not present

## 2017-09-16 DIAGNOSIS — I209 Angina pectoris, unspecified: Secondary | ICD-10-CM | POA: Diagnosis not present

## 2017-09-16 DIAGNOSIS — E7800 Pure hypercholesterolemia, unspecified: Secondary | ICD-10-CM

## 2017-09-16 DIAGNOSIS — Z0000 Encounter for general adult medical examination without abnormal findings: Secondary | ICD-10-CM | POA: Diagnosis not present

## 2017-09-16 DIAGNOSIS — E1122 Type 2 diabetes mellitus with diabetic chronic kidney disease: Secondary | ICD-10-CM | POA: Diagnosis not present

## 2017-09-16 DIAGNOSIS — I10 Essential (primary) hypertension: Secondary | ICD-10-CM | POA: Diagnosis not present

## 2017-09-16 LAB — POCT GLYCOSYLATED HEMOGLOBIN (HGB A1C): HEMOGLOBIN A1C: 6.1

## 2017-09-16 NOTE — Patient Instructions (Addendum)
Your A1c was OK today  Please continue all other medications as before, and refills have been done if requested.  Please have the pharmacy call with any other refills you may need.  Please continue your efforts at being more active, low cholesterol diet, and weight control.  Please keep your appointments with your specialists as you may have planned  Please return in 6 months, or sooner if needed  Continue doing brain stimulating activities (puzzles, reading, adult coloring books, staying active) to keep memory sharp.   Continue to eat heart healthy diet (full of fruits, vegetables, whole grains, lean protein, water--limit salt, fat, and sugar intake) and increase physical activity as tolerated.   Thomas Marshall , Thank you for taking time to come for your Medicare Wellness Visit. I appreciate your ongoing commitment to your health goals. Please review the following plan we discussed and let me know if I can assist you in the future.   These are the goals we discussed: Goals    . Get back to walking as soon as I get my heart situated          I want to get back to mowing my yard asap       This is a list of the screening recommended for you and due dates:  Health Maintenance  Topic Date Due  . Eye exam for diabetics  01/28/2018  . Complete foot exam   03/16/2018  . Urine Protein Check  03/16/2018  . Tetanus Vaccine  04/03/2024  . Flu Shot  Completed  . Pneumonia vaccines  Completed

## 2017-09-16 NOTE — Progress Notes (Signed)
Subjective:    Patient ID: Thomas Marshall, male    DOB: 02-Sep-1941, 76 y.o.   MRN: [id number]  HPI  Here to f/u; overall doing ok,  Pt denies chest pain, increasing sob or doe, wheezing, orthopnea, PND, increased LE swelling, palpitations, dizziness or syncope.  Pt denies new neurological symptoms such as new headache, or facial or extremity weakness or numbness.  Pt denies polydipsia, polyuria, or low sugar episode.  Pt states overall good compliance with meds, mostly trying to follow appropriate diet, with wt overall stable,  And for cardiac monitor to be placed tomorrow.  Maybe considering ablation.  Past Medical History:  Diagnosis Date  . AAA (abdominal aortic aneurysm) ([place])   . Arthritis   . Cancer (HCC)    skin & squamous cell  . Carotid artery disease ([place])    a. s/p R CEA 12/2015.  [name] CKD (chronic kidney disease), stage III ([place])    a. per historical labs.  . Coronary artery disease    a. s/p CABG 2004.  . Diabetes mellitus without complication ([place])   . GERD (gastroesophageal reflux disease)   . Hyperlipidemia   . Hypertension   . Medication intolerance Multiple   . PAF (paroxysmal atrial fibrillation) ([place])   . Pneumonia   . Sinus bradycardia    a. baseline HR 50s-60s, also h/o bradycardia on metoprolol and carvedilol   Past Surgical History:  Procedure Laterality Date  . ANKLE SURGERY     right fused  . BACK SURGERY     low back   . CHOLECYSTECTOMY N/A 12/07/2013   Procedure: LAPAROSCOPIC CHOLECYSTECTOMY ;  Surgeon: [name], MD;  Location: [facility];  Service: General;  Laterality: N/A;  . COLONOSCOPY    . CORONARY ARTERY BYPASS GRAFT  2004   [place]  . CORONARY STENT INTERVENTION N/A 05/21/2017   Procedure: Coronary Stent Intervention;  Surgeon: [name], MD;  Location: [facility];  Service: Cardiovascular;  Laterality: N/A;  . ENDARTERECTOMY Right 12/10/2015   Procedure: Right Carotid ENDARTERECTOMY;  Surgeon: Conrad Allensworth, MD;  Location: [facility];  Service: Vascular;  Laterality: Right;  . EXPLORATION POST OPERATIVE OPEN HEART    . FINGER SURGERY     skin graft on rt index  . FINGER SURGERY Right    right index finger.  [name] HERNIA REPAIR Right    RIH  . JOINT REPLACEMENT    . KNEE SURGERY     replacement on both knees  . LEFT HEART CATH AND CORS/GRAFTS ANGIOGRAPHY N/A 05/21/2017   Procedure: Left Heart Cath and Cors/Grafts Angiography;  Surgeon: [name], MD;  Location: [facility];  Service: Cardiovascular;  Laterality: N/A;  . PROSTATE SURGERY    . TONSILLECTOMY    . URINARY SURGERY     scar tissue    reports that he quit smoking about 46 years ago. He has a 13.00 pack-year smoking history. He has never used smokeless tobacco. He reports that he does not drink alcohol or use drugs. family history includes Cancer in his sister; Diabetes in his brother and sister; Heart disease in his brother, father, and mother; Hyperlipidemia in his brother and father; Hypertension in his brother, father, and sister; Stroke in his mother. Allergies  Allergen Reactions  . Lisinopril Swelling and Other (See Comments)    Angioedema.   . Losartan Swelling and Other (See Comments)    Lips swell Angioedema   . Nifedipine Swelling    Sugar  increase  . Triamterene Swelling  . Hydralazine Hcl Other (See Comments)    Fatigue; poor appetite  . Amoxicillin Rash    Has patient had a PCN reaction causing immediate rash, facial/tongue/throat swelling, SOB or lightheadedness with hypotension:No Has patient had a PCN reaction causing severe rash involving mucus membranes or skin necrosis:No Has patient had a PCN reaction that required hospitalization:No Has patient had a PCN reaction occurring within the last 10 years: No If all of the above answers are "NO", then may proceed with Cephalosporin use.   . Ampicillin Rash    Has patient had a PCN reaction causing immediate rash, facial/tongue/throat swelling, SOB or lightheadedness with  hypotension:No Has patient had a PCN reaction causing severe rash involving mucus membranes or skin necrosis:No Has patient had a PCN reaction that required hospitalization:No Has patient had a PCN reaction occurring within the last 10 years: No If all of the above answers are "NO", then may proceed with Cephalosporin use.    . Carvedilol Other (See Comments)    Low heart rate    Current Outpatient Prescriptions on File Prior to Visit  Medication Sig Dispense Refill  . amLODipine (NORVASC) 10 MG tablet Take 1 tablet (10 mg total) by mouth daily. 30 tablet 11  . apixaban (ELIQUIS) 5 MG TABS tablet Take 5 mg by mouth 2 (two) times daily. (0800 & 2000)    . atorvastatin (LIPITOR) 40 MG tablet Take 40 mg by mouth at bedtime.     . cholecalciferol (VITAMIN D) 1000 UNITS tablet Take 1,000 Units by mouth every evening.     . cloNIDine (CATAPRES) 0.1 MG tablet TAKE ONE TABLET BY MOUTH TWICE DAILY 180 tablet 3  . clopidogrel (PLAVIX) 75 MG tablet Take 1 tablet (75 mg total) by mouth daily. 90 tablet 3  . dofetilide (TIKOSYN) 250 MCG capsule Take 1 capsule (250 mcg total) by mouth 2 (two) times daily. 60 capsule 6  . glucose blood test strip Use to test blood glucose level 2 times a day. 100 each 12  . magnesium oxide (MAG-OX) 400 MG tablet Take 1 tablet (400 mg total) by mouth 4 (four) times daily. 360 tablet 3  . metFORMIN (GLUCOPHAGE) 1000 MG tablet Take 1 tablet (1,000 mg total) by mouth 2 (two) times daily with a meal. 180 tablet 3  . nitroGLYCERIN (NITROSTAT) 0.4 MG SL tablet Place 1 tablet (0.4 mg total) under the tongue every 5 (five) minutes as needed for chest pain (x 3 doses). 30 tablet 3  . pantoprazole (PROTONIX) 40 MG tablet Take 1 tablet (40 mg total) by mouth daily. 90 tablet 3  . terazosin (HYTRIN) 1 MG capsule Take 1 mg by mouth at bedtime. (2100) 30 capsule 11  . triamcinolone cream (KENALOG) 0.1 % Apply 1 application topically 2 (two) times daily. 30 g 1  . vitamin C (ASCORBIC  ACID) 500 MG tablet Take 500 mg by mouth every evening. (2100)     No current facility-administered medications on file prior to visit.     Review of Systems  Constitutional: Negative for other unusual diaphoresis or sweats HENT: Negative for ear discharge or swelling Eyes: Negative for other worsening visual disturbances Respiratory: Negative for stridor or other swelling  Gastrointestinal: Negative for worsening distension or other blood Genitourinary: Negative for retention or other urinary change Musculoskeletal: Negative for other MSK pain or swelling Skin: Negative for color change or other new lesions Neurological: Negative for worsening tremors and other numbness  Psychiatric/Behavioral: Negative for  worsening agitation or other fatigue All other system neg per npt    Objective:   Physical Exam BP 134/62   Pulse (!) 53   Temp 97.7 F (36.5 C) (Oral)   Ht 6\' 2"  (1.88 m)   Wt 219 lb (99.3 kg)   SpO2 98%   BMI 28.12 kg/m  VS noted,  Constitutional: Pt appears in NAD HENT: Head: NCAT.  Right Ear: External ear normal.  Left Ear: External ear normal.  Eyes: . Pupils are equal, round, and reactive to light. Conjunctivae and EOM are normal Nose: without d/c or deformity Neck: Neck supple. Gross normal ROM Cardiovascular: Normal rate and regular rhythm.   Pulmonary/Chest: Effort normal and breath sounds without rales or wheezing.  Abd:  Soft, NT, ND, + BS, no organomegaly Neurological: Pt is alert. At baseline orientation, motor grossly intact Skin: Skin is warm. No rashes, other new lesions, no LE edema Psychiatric: Pt behavior is normal without agitation   POCT - A1c today -  6.1     Assessment & Plan:

## 2017-09-17 ENCOUNTER — Ambulatory Visit (INDEPENDENT_AMBULATORY_CARE_PROVIDER_SITE_OTHER): Payer: Medicare Other

## 2017-09-17 DIAGNOSIS — I493 Ventricular premature depolarization: Secondary | ICD-10-CM

## 2017-09-17 DIAGNOSIS — R001 Bradycardia, unspecified: Secondary | ICD-10-CM

## 2017-09-18 NOTE — Assessment & Plan Note (Signed)
stable overall by history and exam, recent data reviewed with pt, and pt to continue medical treatment as before,  to f/u any worsening symptoms or concerns Lab Results  Component Value Date   HGBA1C 6.1 09/16/2017

## 2017-09-18 NOTE — Assessment & Plan Note (Signed)
stable overall by history and exam, recent data reviewed with pt, and pt to continue medical treatment as before,  to f/u any worsening symptoms or concerns BP Readings from Last 3 Encounters:  09/16/17 134/62  09/05/17 114/75  08/30/17 132/72

## 2017-09-18 NOTE — Assessment & Plan Note (Signed)
stable overall by history and exam, recent data reviewed with pt, and pt to continue medical treatment as before,  to f/u any worsening symptoms or concerns Lab Results  Component Value Date   LDLCALC 56 03/16/2017

## 2017-09-23 ENCOUNTER — Encounter: Payer: Self-pay | Admitting: Internal Medicine

## 2017-09-23 ENCOUNTER — Encounter: Payer: Self-pay | Admitting: Cardiovascular Disease

## 2017-09-23 ENCOUNTER — Ambulatory Visit (INDEPENDENT_AMBULATORY_CARE_PROVIDER_SITE_OTHER): Payer: Medicare Other | Admitting: Cardiovascular Disease

## 2017-09-23 VITALS — BP 142/60 | HR 61 | Ht 74.0 in | Wt 221.8 lb

## 2017-09-23 DIAGNOSIS — I10 Essential (primary) hypertension: Secondary | ICD-10-CM

## 2017-09-23 DIAGNOSIS — I209 Angina pectoris, unspecified: Secondary | ICD-10-CM

## 2017-09-23 DIAGNOSIS — R0602 Shortness of breath: Secondary | ICD-10-CM | POA: Diagnosis not present

## 2017-09-23 DIAGNOSIS — E7800 Pure hypercholesterolemia, unspecified: Secondary | ICD-10-CM

## 2017-09-23 DIAGNOSIS — I480 Paroxysmal atrial fibrillation: Secondary | ICD-10-CM

## 2017-09-23 DIAGNOSIS — I2510 Atherosclerotic heart disease of native coronary artery without angina pectoris: Secondary | ICD-10-CM | POA: Diagnosis not present

## 2017-09-23 NOTE — Progress Notes (Signed)
Cardiology Office Note Date:  09/23/2017   ID:  Marshall, Thomas 11-21-1941, MRN [id number]  PCP:  Thomas Borg, MD  Cardiologist:  Thomas Mocha, MD    Chief Complaint  Patient presents with  . Follow-up     History of Present Illness: Thomas Marshall is a 76 y.o. male who presents for follow-up evaluation.  The patient was recently hospitalized with weakness, dizziness, and bradycardia.he was noted to have frequent PVCs on monitoring. He was evaluated by Thomas Marshall in the hospital who arranged a 24-hour Holter monitor to quantify his PVC burden. He presents today for hospital follow-up evaluation.  The patient continues to complain of fatigue. He denies lightheadedness or presyncope. He is not able to do things he would like because he feels washed out. He has occasional chest pain but this is not related to exertion. He points to a focal spot on the left chest and states this causes pain at rest but describes it as mild. He also has shortness of breath with activity which is unchanged over time. He denies orthopnea, PND, or syncope. He denies a perception of heart palpitations.   Past Medical History:  Diagnosis Date  . AAA (abdominal aortic aneurysm) ([place])   . Arthritis   . Cancer (HCC)    skin & squamous cell  . Carotid artery disease (Thomas Marshall)    a. s/p R CEA 12/2015.  Thomas Marshall CKD (chronic kidney disease), stage III (Thomas Marshall)    a. per historical labs.  . Coronary artery disease    a. s/p CABG 2004.  . Diabetes mellitus without complication (Thomas Marshall)   . GERD (gastroesophageal reflux disease)   . Hyperlipidemia   . Hypertension   . Medication intolerance Multiple   . PAF (paroxysmal atrial fibrillation) ([place])   . Pneumonia   . Sinus bradycardia    a. baseline HR 50s-60s, also h/o bradycardia on metoprolol and carvedilol    Past Surgical History:  Procedure Laterality Date  . ANKLE SURGERY     right fused  . BACK SURGERY     low back   . CHOLECYSTECTOMY N/A 12/07/2013   Procedure: LAPAROSCOPIC CHOLECYSTECTOMY ;  Surgeon: Thomas Bookbinder, MD;  Location: [facility];  Service: General;  Laterality: N/A;  . COLONOSCOPY    . CORONARY ARTERY BYPASS GRAFT  2004   [place]  . CORONARY STENT INTERVENTION N/A 05/21/2017   Procedure: Coronary Stent Intervention;  Surgeon: Thomas Mocha, MD;  Location: [facility];  Service: Cardiovascular;  Laterality: N/A;  . ENDARTERECTOMY Right 12/10/2015   Procedure: Right Carotid ENDARTERECTOMY;  Surgeon: Thomas [place], MD;  Location: [facility];  Service: Vascular;  Laterality: Right;  . EXPLORATION POST OPERATIVE OPEN HEART    . FINGER SURGERY     skin graft on rt index  . FINGER SURGERY Right    right index finger.  Thomas Marshall HERNIA REPAIR Right    RIH  . JOINT REPLACEMENT    . KNEE SURGERY     replacement on both knees  . LEFT HEART CATH AND CORS/GRAFTS ANGIOGRAPHY N/A 05/21/2017   Procedure: Left Heart Cath and Cors/Grafts Angiography;  Surgeon: Thomas Mocha, MD;  Location: [facility];  Service: Cardiovascular;  Laterality: N/A;  . PROSTATE SURGERY    . TONSILLECTOMY    . URINARY SURGERY     scar tissue    Current Outpatient Prescriptions  Medication Sig Dispense Refill  . amLODipine (NORVASC) 10 MG tablet Take 1 tablet (10  mg total) by mouth daily. 30 tablet 11  . apixaban (ELIQUIS) 5 MG TABS tablet Take 5 mg by mouth 2 (two) times daily. (0800 & 2000)    . atorvastatin (LIPITOR) 40 MG tablet Take 40 mg by mouth at bedtime.     . cholecalciferol (VITAMIN D) 1000 UNITS tablet Take 1,000 Units by mouth every evening.     . cloNIDine (CATAPRES) 0.1 MG tablet TAKE ONE TABLET BY MOUTH TWICE DAILY 180 tablet 3  . clopidogrel (PLAVIX) 75 MG tablet Take 1 tablet (75 mg total) by mouth daily. 90 tablet 3  . dofetilide (TIKOSYN) 250 MCG capsule Take 1 capsule (250 mcg total) by mouth 2 (two) times daily. 60 capsule 6  . glucose blood test strip Use to test blood glucose level 2 times a day. 100 each 12  . magnesium  oxide (MAG-OX) 400 MG tablet Take 1 tablet (400 mg total) by mouth 4 (four) times daily. 360 tablet 3  . metFORMIN (GLUCOPHAGE) 1000 MG tablet Take 1 tablet (1,000 mg total) by mouth 2 (two) times daily with a meal. 180 tablet 3  . nitroGLYCERIN (NITROSTAT) 0.4 MG SL tablet Place 1 tablet (0.4 mg total) under the tongue every 5 (five) minutes as needed for chest pain (x 3 doses). 30 tablet 3  . pantoprazole (PROTONIX) 40 MG tablet Take 1 tablet (40 mg total) by mouth daily. 90 tablet 3  . terazosin (HYTRIN) 1 MG capsule Take 1 mg by mouth at bedtime. (2100) 30 capsule 11  . triamcinolone cream (KENALOG) 0.1 % Apply 1 application topically 2 (two) times daily. 30 g 1  . vitamin C (ASCORBIC ACID) 500 MG tablet Take 500 mg by mouth daily.      No current facility-administered medications for this visit.     Allergies:   Lisinopril; Losartan; Nifedipine; Triamterene; Hydralazine hcl; Amoxicillin; Ampicillin; and Carvedilol   Social History:  The patient  reports that he quit smoking about 46 years ago. He has a 13.00 pack-year smoking history. He has never used smokeless tobacco. He reports that he does not drink alcohol or use drugs.   Family History:  The patient's family history includes Cancer in his sister; Diabetes in his brother and sister; Heart disease in his brother, father, and mother; Hyperlipidemia in his brother and father; Hypertension in his brother, father, and sister; Stroke in his mother.    ROS:  Please see the history of present illness.  All other systems are reviewed and negative.    PHYSICAL EXAM: VS:  BP (!) 142/60   Pulse 61   Ht '6\' 2"'$  (1.88 m)   Wt 221 lb 12.8 oz (100.6 kg)   BMI 28.48 kg/m  , BMI Body mass index is 28.48 kg/m. GEN: Well nourished, well developed, in no acute distress  HEENT: normal  Neck: no JVD, no masses. No carotid bruits Cardiac: RRR without murmur or gallop, frequent premature beats noted.               Respiratory:  clear to auscultation  bilaterally, normal work of breathing GI: soft, nontender, nondistended, + BS MS: no deformity or atrophy  Ext: no pretibial edema, pedal pulses 2+= bilaterally Skin: warm and dry, no rash Neuro:  Strength and sensation are intact Psych: euthymic mood, full affect  EKG:  EKG is not ordered today.  Recent Labs: 03/16/2017: TSH 3.47 05/17/2017: NT-Pro BNP 277 09/04/2017: ALT 14 09/05/2017: BUN 23; Creatinine, Ser 1.47; Hemoglobin 10.4; Magnesium 2.3; Platelets 117; Potassium  4.5; Sodium 138   Lipid Panel     Component Value Date/Time   CHOL 107 03/16/2017 0953   TRIG 68.0 03/16/2017 0953   HDL 37.70 (L) 03/16/2017 0953   CHOLHDL 3 03/16/2017 0953   VLDL 13.6 03/16/2017 0953   LDLCALC 56 03/16/2017 0953   LDLDIRECT 67.0 09/16/2016 1455      Wt Readings from Last 3 Encounters:  09/23/17 221 lb 12.8 oz (100.6 kg)  09/16/17 219 lb (99.3 kg)  09/05/17 213 lb 9.6 oz (96.9 kg)     Cardiac Studies Reviewed: Echo 05-27-2016: Study Conclusions  - Left ventricle: The cavity size was normal. Wall thickness was   increased in a pattern of mild LVH. Systolic function was normal.   The estimated ejection fraction was in the range of 55% to 60%.   Wall motion was normal; there were no regional wall motion   abnormalities. Features are consistent with a pseudonormal left   ventricular filling pattern, with concomitant abnormal relaxation   and increased filling pressure (grade 2 diastolic dysfunction). - Aortic valve: Trileaflet; moderately calcified leaflets.   Sclerosis without stenosis. - Aorta: Mildly dilated aortic root. - Mitral valve: Moderately to severely calcified annulus. There was   trivial regurgitation. - Left atrium: The atrium was moderately dilated. - Right ventricle: The cavity size was normal. Systolic function   was normal. - Right atrium: The atrium was mildly dilated. - Tricuspid valve: Peak RV-RA gradient (S): 26 mm Hg. - Pulmonary arteries: PA peak pressure:  29 mm Hg (S). - Inferior vena cava: The vessel was normal in size. The   respirophasic diameter changes were in the normal range (>= 50%),   consistent with normal central venous pressure.  Impressions:  - Normal LV size with mild LV hypertrophy. EF 55-60%. Moderate   diastolic dysfunction. Normal RV size and systolic function.   Aortic sclerosis without significant stenosis. Biatrial   enlargement.  Cath 05-21-2017: Coronary Findings   Dominance: Right  Left Main  Ost LM to LM lesion, 100% stenosed. The left main is critically stenosed with minimal flow beyond the area of severe stenosis.  Right Coronary Artery  Mid RCA to Dist RCA lesion, 100% stenosed.  Graft Angiography  Free RIMA Graft to RPDA  RIMA graft was visualized by non-selective angiography. The RIMA to PDA graft is patent. The right subclavian artery is very tortuous and will not allow for a catheter to be advanced to the area of the [place]. The vessel was imaged nonselectively and is demonstrated to be patent. The distal anastomosis is poorly visualized but there is TIMI 3 flow.  saphenous Graft to Ramus  SVG. The SVG to ramus intermedius has a critical degenerated 95% eccentric lesion with TIMI 2 flow beyond the lesion. The lesion is located in the proximal body of the graft.  Prox Graft lesion, 95% stenosed. The lesion is eccentric. An AL-1 guide catheter is used. Angiomax was used for coagulation. The right femoral sheath was upsized to a 6 [place]. A therapeutic ACT is achieved. The lesion is crossed with cougar wire. Verapamil was administered through the guide once the bypass graft is selectively engaged. The lesion is predilated with a 2.5 mm balloon. Verapamil was administered again. A 5 mm spider distal embolic protection device was prepped and advanced beyond the lesion. The lesion is then stented with a 4.0 x 16 mm Promus DES deployed at 14 atm. The spider embolic protection device is removed and final angiography  confirmed 0% residual  stenosis and TIMI-3 flow.  Angioplasty: Lesion crossed with guidewire. Pre-stent angioplasty was performed. A STENT PROMUS PREM MR [id number] drug eluting stent was successfully placed. Post-stent angioplasty was not performed. The pre-interventional distal flow is decreased (TIMI 2). The post-interventional distal flow is normal (TIMI 3). No complications occurred at this lesion.  There is no residual stenosis post intervention.  saphenous Graft to 1st Mrg  SVG. The SVG to OM is patent with diffuse irregularity. The OM branch where the vessel is tented up has an unusual appearance just beyond the distal anastomosis. There is at least 50-70% stenosis in that region.  Free LIMA Graft to Mid LAD  LIMA. The LIMA to mid LAD is widely patent with no stenosis. The LAD beyond the LIMA insertion site is patent.  Coronary Diagrams   Diagnostic Diagram       Post-Intervention Diagram        Holter Monitor: Study Highlights   The basic rhythm is sinus bradycardia. There is a high PVC burden (25%) There are short supraventricular runs and short ventricular runs noted.   ASSESSMENT AND PLAN: 1.  Frequent PVCs: The patient may be symptomatic although it is somewhat difficult to know for certain but he clearly complains of worsening generalized fatigue. He does havea high PVC burden (27,000 PVCs/25% of his total heartbeats) in a 24-hour period. He is currently treated with dofetilide for paroxysmal atrial fibrillation. He has not been able to tolerate beta blockers because of bradycardia. I will refer him back to Thomas Marshall to review treatment options. Also will check an echocardiogram to assess the impact of frequent PVCs on his LV function. His last echocardiogram 1 year ago showed normal LV systolic function. He has no clear signs or symptoms of heart failure.  2. Paroxysmal atrial fibrillation: Maintaining sinus rhythm on dofetilide. Continue apixaban for anticoagulation.  3.  Coronary artery disease, native vessel, without angina: Current treatment is reviewed and will be continued. After undergoing saphenous vein graft stenting I would be inclined to keep him on clopidogrel in addition to oral anticoagulation with apixaban. He is not taking aspirin. He reports no problems with bleeding or bruising. Will review again when I see him back in 6 months.  Current medicines are reviewed with the patient today.  The patient does not have concerns regarding medicines.  Labs/ tests ordered today include:   Orders Placed This Encounter  Procedures  . ECHOCARDIOGRAM COMPLETE   Disposition:   FU 6 months  Signed, Thomas Mocha, MD  09/23/2017 5:27 PM    [facility] [place], [place], [place]  [address] Phone: [phone]; Fax: [phone]

## 2017-09-23 NOTE — Patient Instructions (Signed)
Medication Instructions:  Your provider recommends that you continue on your current medications as directed. Please refer to the Current Medication list given to you today.    Labwork: None  Testing/Procedures: Your provider has requested that you have an echocardiogram. Echocardiography is a painless test that uses sound waves to create images of your heart. It provides your doctor with information about the size and shape of your heart and how well your heart's chambers and valves are working. This procedure takes approximately one hour. There are no restrictions for this procedure.  Follow-Up: Your provider recommends that you schedule a follow-up appointment with [name].   Any Other Special Instructions Will Be Listed Below (If Applicable).     If you need a refill on your cardiac medications before your next appointment, please call your pharmacy.

## 2017-09-29 ENCOUNTER — Ambulatory Visit (HOSPITAL_COMMUNITY): Payer: Medicare Other | Attending: Cardiovascular Disease

## 2017-09-29 ENCOUNTER — Other Ambulatory Visit: Payer: Self-pay

## 2017-09-29 DIAGNOSIS — E119 Type 2 diabetes mellitus without complications: Secondary | ICD-10-CM | POA: Insufficient documentation

## 2017-09-29 DIAGNOSIS — E785 Hyperlipidemia, unspecified: Secondary | ICD-10-CM | POA: Diagnosis not present

## 2017-09-29 DIAGNOSIS — R0602 Shortness of breath: Secondary | ICD-10-CM | POA: Diagnosis not present

## 2017-09-29 DIAGNOSIS — I10 Essential (primary) hypertension: Secondary | ICD-10-CM | POA: Diagnosis not present

## 2017-09-29 DIAGNOSIS — I2510 Atherosclerotic heart disease of native coronary artery without angina pectoris: Secondary | ICD-10-CM | POA: Diagnosis not present

## 2017-09-29 DIAGNOSIS — I4891 Unspecified atrial fibrillation: Secondary | ICD-10-CM | POA: Diagnosis not present

## 2017-09-29 DIAGNOSIS — Z951 Presence of aortocoronary bypass graft: Secondary | ICD-10-CM | POA: Insufficient documentation

## 2017-10-06 DIAGNOSIS — L578 Other skin changes due to chronic exposure to nonionizing radiation: Secondary | ICD-10-CM | POA: Diagnosis not present

## 2017-10-06 DIAGNOSIS — L570 Actinic keratosis: Secondary | ICD-10-CM | POA: Diagnosis not present

## 2017-10-06 DIAGNOSIS — L821 Other seborrheic keratosis: Secondary | ICD-10-CM | POA: Diagnosis not present

## 2017-10-13 ENCOUNTER — Ambulatory Visit (INDEPENDENT_AMBULATORY_CARE_PROVIDER_SITE_OTHER): Payer: Medicare Other | Admitting: Internal Medicine

## 2017-10-13 ENCOUNTER — Encounter: Payer: Self-pay | Admitting: Internal Medicine

## 2017-10-13 VITALS — BP 130/64 | HR 63 | Ht 74.0 in | Wt 218.8 lb

## 2017-10-13 DIAGNOSIS — I480 Paroxysmal atrial fibrillation: Secondary | ICD-10-CM | POA: Diagnosis not present

## 2017-10-13 DIAGNOSIS — R0602 Shortness of breath: Secondary | ICD-10-CM | POA: Diagnosis not present

## 2017-10-13 DIAGNOSIS — I493 Ventricular premature depolarization: Secondary | ICD-10-CM

## 2017-10-13 DIAGNOSIS — I209 Angina pectoris, unspecified: Secondary | ICD-10-CM

## 2017-10-13 MED ORDER — FUROSEMIDE 20 MG PO TABS: 20.0000 mg | ORAL_TABLET | Freq: Every day | ORAL | 3 refills | Status: DC | PRN

## 2017-10-13 MED ORDER — FUROSEMIDE 20 MG PO TABS: 20.0000 mg | ORAL_TABLET | Freq: Every day | ORAL | 3 refills | Status: DC

## 2017-10-13 NOTE — H&P (View-Only) (Signed)
HPI Thomas Marshall returns today for ongoing evaluation and management of paroxysmal atrial fibrillation and PVCs. He has preserved left ventricular function by echo. He is maintaining sinus rhythm fairly nicely with dofetilide. He has mild aortic stenosis which is thought not to be hemodynamically important. The patient has had worsening shortness of breath and a cardiac monitor demonstrated 27,000 PVCs in 24 hours. The patient does not have palpitations. He does feel his pulse from time to time and notes that it is irregular with pauses. He has PND and orthopnea. He denies peripheral edema. He has not had frank syncope. He has known aortic vascular disease. He has a history of carotid disease as well. Allergies  Allergen Reactions  . Lisinopril Swelling and Other (See Comments)    Angioedema.   . Losartan Swelling and Other (See Comments)    Lips swell Angioedema   . Nifedipine Swelling    Sugar increase  . Triamterene Swelling  . Hydralazine Hcl Other (See Comments)    Fatigue; poor appetite  . Amoxicillin Rash    Has patient had a PCN reaction causing immediate rash, facial/tongue/throat swelling, SOB or lightheadedness with hypotension:No Has patient had a PCN reaction causing severe rash involving mucus membranes or skin necrosis:No Has patient had a PCN reaction that required hospitalization:No Has patient had a PCN reaction occurring within the last 10 years: No If all of the above answers are "NO", then may proceed with Cephalosporin use.   . Ampicillin Rash    Has patient had a PCN reaction causing immediate rash, facial/tongue/throat swelling, SOB or lightheadedness with hypotension:No Has patient had a PCN reaction causing severe rash involving mucus membranes or skin necrosis:No Has patient had a PCN reaction that required hospitalization:No Has patient had a PCN reaction occurring within the last 10 years: No If all of the above answers are "NO", then may proceed with  Cephalosporin use.    . Carvedilol Other (See Comments)    Low heart rate      Current Outpatient Medications  Medication Sig Dispense Refill  . amLODipine (NORVASC) 10 MG tablet Take 1 tablet (10 mg total) by mouth daily. 30 tablet 11  . apixaban (ELIQUIS) 5 MG TABS tablet Take 5 mg by mouth 2 (two) times daily. (0800 & 2000)    . atorvastatin (LIPITOR) 40 MG tablet Take 40 mg by mouth at bedtime.     . cholecalciferol (VITAMIN D) 1000 UNITS tablet Take 1,000 Units by mouth every evening.     . cloNIDine (CATAPRES) 0.1 MG tablet TAKE ONE TABLET BY MOUTH TWICE DAILY 180 tablet 3  . clopidogrel (PLAVIX) 75 MG tablet Take 1 tablet (75 mg total) by mouth daily. 90 tablet 3  . dofetilide (TIKOSYN) 250 MCG capsule Take 1 capsule (250 mcg total) by mouth 2 (two) times daily. 60 capsule 6  . glucose blood test strip Use to test blood glucose level 2 times a day. 100 each 12  . magnesium oxide (MAG-OX) 400 MG tablet Take 1 tablet (400 mg total) by mouth 4 (four) times daily. 360 tablet 3  . metFORMIN (GLUCOPHAGE) 1000 MG tablet Take 1 tablet (1,000 mg total) by mouth 2 (two) times daily with a meal. 180 tablet 3  . nitroGLYCERIN (NITROSTAT) 0.4 MG SL tablet Place 1 tablet (0.4 mg total) under the tongue every 5 (five) minutes as needed for chest pain (x 3 doses). 30 tablet 3  . pantoprazole (PROTONIX) 40 MG tablet Take 1 tablet (40  mg total) by mouth daily. 90 tablet 3  . terazosin (HYTRIN) 1 MG capsule Take 1 mg by mouth at bedtime. (2100) 30 capsule 11  . triamcinolone cream (KENALOG) 0.1 % Apply 1 application topically 2 (two) times daily. 30 g 1  . vitamin C (ASCORBIC ACID) 500 MG tablet Take 500 mg by mouth daily.     . furosemide (LASIX) 20 MG tablet Take 1 tablet (20 mg total) daily as needed by mouth. 90 tablet 3   No current facility-administered medications for this visit.      Past Medical History:  Diagnosis Date  . AAA (abdominal aortic aneurysm) ([place])   . Arthritis   .  Cancer (HCC)    skin & squamous cell  . Carotid artery disease ([place])    a. s/p R CEA 12/2015.  [name] CKD (chronic kidney disease), stage III ([place])    a. per historical labs.  . Coronary artery disease    a. s/p CABG 2004.  . Diabetes mellitus without complication ([employer])   . GERD (gastroesophageal reflux disease)   . Hyperlipidemia   . Hypertension   . Medication intolerance Multiple   . PAF (paroxysmal atrial fibrillation) ([place])   . Pneumonia   . Sinus bradycardia    a. baseline HR 50s-60s, also h/o bradycardia on metoprolol and carvedilol    ROS:   All systems reviewed and negative except as noted in the HPI.   Past Surgical History:  Procedure Laterality Date  . ANKLE SURGERY     right fused  . BACK SURGERY     low back   . COLONOSCOPY    . CORONARY ARTERY BYPASS GRAFT  2004   [place]  . EXPLORATION POST OPERATIVE OPEN HEART    . FINGER SURGERY     skin graft on rt index  . FINGER SURGERY Right    right index finger.  [name] HERNIA REPAIR Right    [facility]  . JOINT REPLACEMENT    . KNEE SURGERY     replacement on both knees  . PROSTATE SURGERY    . TONSILLECTOMY    . URINARY SURGERY     scar tissue     Family History  Problem Relation Age of Onset  . Stroke Mother   . Heart disease Mother   . Heart disease Father   . Hypertension Father   . Hyperlipidemia Father   . Cancer Sister        breast  and skin  . Diabetes Sister   . Hypertension Sister   . Diabetes Brother   . Heart disease Brother   . Hyperlipidemia Brother   . Hypertension Brother   . Early death Neg Hx      Social History   Socioeconomic History  . Marital status: Married    Spouse name: Not on file  . Number of children: Not on file  . Years of education: Not on file  . Highest education level: Not on file  Social Needs  . Financial resource strain: Not on file  . Food insecurity - worry: Not on file  . Food insecurity - inability: Not on file  . Transportation needs - medical: Not  on file  . Transportation needs - non-medical: Not on file  Occupational History  . Not on file  Tobacco Use  . Smoking status: Former Smoker    Packs/day: 1.00    Years: 13.00    Pack years: 13.00    Last attempt to quit:  11/30/1970    Years since quitting: 46.9  . Smokeless tobacco: Never Used  Substance and Sexual Activity  . Alcohol use: No    Alcohol/week: 0.0 oz  . Drug use: No  . Sexual activity: Yes  Other Topics Concern  . Not on file  Social History Narrative  . Not on file     BP 130/64   Pulse 63   Ht 6\' 2"  (1.88 m)   Wt 218 lb 12.8 oz (99.2 kg)   SpO2 99%   BMI 28.09 kg/m   Physical Exam:  Well appearing 76 year old man, NAD HEENT: Unremarkable Neck:  6 cm JVD, no thyromegally Lymphatics:  No adenopathy Back:  No CVA tenderness Lungs:  Clear, with no wheezes, rales, or rhonchi. HEART:  IRegular rate rhythm, no murmurs, no rubs, no clicks Abd:  soft, positive bowel sounds, no organomegally, no rebound, no guarding Ext:  2 plus pulses, no edema, no cyanosis, no clubbing Skin:  No rashes no nodules Neuro:  CN II through XII intact, motor grossly intact  EKG - reviewed. Normal sinus rhythm with frequent PVCs which have a right bundle branch block superior axis.  Assess/Plan: 1. Symptomatic PVCs - as the patient has over 20,000 PVCs and is already on dofetilide, we have no additional antiarrhythmic options. Previously, he developed bradycardia on beta blocker therapy. The 2 treatment options would include stopping dofetilide and using amiodarone or continuing dofetilide in proceeding with catheter ablation. He is at extreme risk for the development of a PVC induced cardiomyopathy if we do not control his ventricular ectopy. 2. Chronic diastolic heart failure - he has developed PND and orthopnea. I've given him a prescription for low dose Lasix. Unfortunately he cannot take any afterload reduction with an ACE inhibitor or an ARB. A low-sodium diet is  recommended. 3. Atrial fibrillation - his symptoms are well-controlled with dofetilide. This medication will be continued. 4. Coronary artery disease - he remains active despite coronary disease and denies anginal symptoms today. No change in medication. 5. Aortic stenosis - by echo this is very mild. I would not anticipate his aortic valve preventing [place] from crossing it to successfully ablate his PVCs which are in the left ventricle. Transseptal catheterization would be an alternative.  Thomas Marshall, M.D.

## 2017-10-13 NOTE — Progress Notes (Signed)
HPI [name] returns today for ongoing evaluation and management of paroxysmal atrial fibrillation and PVCs. He has preserved left ventricular function by echo. He is maintaining sinus rhythm fairly nicely with dofetilide. He has mild aortic stenosis which is thought not to be hemodynamically important. The patient has had worsening shortness of breath and a cardiac monitor demonstrated 27,000 PVCs in 24 hours. The patient does not have palpitations. He does feel his pulse from time to time and notes that it is irregular with pauses. He has PND and orthopnea. He denies peripheral edema. He has not had frank syncope. He has known aortic vascular disease. He has a history of carotid disease as well. Allergies  Allergen Reactions  . Lisinopril Swelling and Other (See Comments)    Angioedema.   . Losartan Swelling and Other (See Comments)    Lips swell Angioedema   . Nifedipine Swelling    Sugar increase  . Triamterene Swelling  . Hydralazine Hcl Other (See Comments)    Fatigue; poor appetite  . Amoxicillin Rash    Has patient had a PCN reaction causing immediate rash, facial/tongue/throat swelling, SOB or lightheadedness with hypotension:No Has patient had a PCN reaction causing severe rash involving mucus membranes or skin necrosis:No Has patient had a PCN reaction that required hospitalization:No Has patient had a PCN reaction occurring within the last 10 years: No If all of the above answers are "NO", then may proceed with Cephalosporin use.   . Ampicillin Rash    Has patient had a PCN reaction causing immediate rash, facial/tongue/throat swelling, SOB or lightheadedness with hypotension:No Has patient had a PCN reaction causing severe rash involving mucus membranes or skin necrosis:No Has patient had a PCN reaction that required hospitalization:No Has patient had a PCN reaction occurring within the last 10 years: No If all of the above answers are "NO", then may proceed with  Cephalosporin use.    . Carvedilol Other (See Comments)    Low heart rate      Current Outpatient Medications  Medication Sig Dispense Refill  . amLODipine (NORVASC) 10 MG tablet Take 1 tablet (10 mg total) by mouth daily. 30 tablet 11  . apixaban (ELIQUIS) 5 MG TABS tablet Take 5 mg by mouth 2 (two) times daily. (0800 & 2000)    . atorvastatin (LIPITOR) 40 MG tablet Take 40 mg by mouth at bedtime.     . cholecalciferol (VITAMIN D) 1000 UNITS tablet Take 1,000 Units by mouth every evening.     . cloNIDine (CATAPRES) 0.1 MG tablet TAKE ONE TABLET BY MOUTH TWICE DAILY 180 tablet 3  . clopidogrel (PLAVIX) 75 MG tablet Take 1 tablet (75 mg total) by mouth daily. 90 tablet 3  . dofetilide (TIKOSYN) 250 MCG capsule Take 1 capsule (250 mcg total) by mouth 2 (two) times daily. 60 capsule 6  . glucose blood test strip Use to test blood glucose level 2 times a day. 100 each 12  . magnesium oxide (MAG-OX) 400 MG tablet Take 1 tablet (400 mg total) by mouth 4 (four) times daily. 360 tablet 3  . metFORMIN (GLUCOPHAGE) 1000 MG tablet Take 1 tablet (1,000 mg total) by mouth 2 (two) times daily with a meal. 180 tablet 3  . nitroGLYCERIN (NITROSTAT) 0.4 MG SL tablet Place 1 tablet (0.4 mg total) under the tongue every 5 (five) minutes as needed for chest pain (x 3 doses). 30 tablet 3  . pantoprazole (PROTONIX) 40 MG tablet Take 1 tablet (40  mg total) by mouth daily. 90 tablet 3  . terazosin (HYTRIN) 1 MG capsule Take 1 mg by mouth at bedtime. (2100) 30 capsule 11  . triamcinolone cream (KENALOG) 0.1 % Apply 1 application topically 2 (two) times daily. 30 g 1  . vitamin C (ASCORBIC ACID) 500 MG tablet Take 500 mg by mouth daily.     . furosemide (LASIX) 20 MG tablet Take 1 tablet (20 mg total) daily as needed by mouth. 90 tablet 3   No current facility-administered medications for this visit.      Past Medical History:  Diagnosis Date  . AAA (abdominal aortic aneurysm) ([place])   . Arthritis   .  Cancer (HCC)    skin & squamous cell  . Carotid artery disease ([place])    a. s/p R CEA 12/2015.  [name] CKD (chronic kidney disease), stage III ([place])    a. per historical labs.  . Coronary artery disease    a. s/p CABG 2004.  . Diabetes mellitus without complication ([place])   . GERD (gastroesophageal reflux disease)   . Hyperlipidemia   . Hypertension   . Medication intolerance Multiple   . PAF (paroxysmal atrial fibrillation) ([place])   . Pneumonia   . Sinus bradycardia    a. baseline HR 50s-60s, also h/o bradycardia on metoprolol and carvedilol    ROS:   All systems reviewed and negative except as noted in the HPI.   Past Surgical History:  Procedure Laterality Date  . ANKLE SURGERY     right fused  . BACK SURGERY     low back   . COLONOSCOPY    . CORONARY ARTERY BYPASS GRAFT  2004   [place]  . EXPLORATION POST OPERATIVE OPEN HEART    . FINGER SURGERY     skin graft on rt index  . FINGER SURGERY Right    right index finger.  [name] HERNIA REPAIR Right    [facility]  . JOINT REPLACEMENT    . KNEE SURGERY     replacement on both knees  . PROSTATE SURGERY    . TONSILLECTOMY    . URINARY SURGERY     scar tissue     Family History  Problem Relation Age of Onset  . Stroke Mother   . Heart disease Mother   . Heart disease Father   . Hypertension Father   . Hyperlipidemia Father   . Cancer Sister        breast  and skin  . Diabetes Sister   . Hypertension Sister   . Diabetes Brother   . Heart disease Brother   . Hyperlipidemia Brother   . Hypertension Brother   . Early death Neg Hx      Social History   Socioeconomic History  . Marital status: Married    Spouse name: Not on file  . Number of children: Not on file  . Years of education: Not on file  . Highest education level: Not on file  Social Needs  . Financial resource strain: Not on file  . Food insecurity - worry: Not on file  . Food insecurity - inability: Not on file  . Transportation needs - medical: Not  on file  . Transportation needs - non-medical: Not on file  Occupational History  . Not on file  Tobacco Use  . Smoking status: Former Smoker    Packs/day: 1.00    Years: 13.00    Pack years: 13.00    Last attempt to quit:  11/30/1970    Years since quitting: 46.9  . Smokeless tobacco: Never Used  Substance and Sexual Activity  . Alcohol use: No    Alcohol/week: 0.0 oz  . Drug use: No  . Sexual activity: Yes  Other Topics Concern  . Not on file  Social History Narrative  . Not on file     BP 130/64   Pulse 63   Ht 6\' 2"  (1.88 m)   Wt 218 lb 12.8 oz (99.2 kg)   SpO2 99%   BMI 28.09 kg/m   Physical Exam:  Well appearing 76 year old man, NAD HEENT: Unremarkable Neck:  6 cm JVD, no thyromegally Lymphatics:  No adenopathy Back:  No CVA tenderness Lungs:  Clear, with no wheezes, rales, or rhonchi. HEART:  IRegular rate rhythm, no murmurs, no rubs, no clicks Abd:  soft, positive bowel sounds, no organomegally, no rebound, no guarding Ext:  2 plus pulses, no edema, no cyanosis, no clubbing Skin:  No rashes no nodules Neuro:  CN II through XII intact, motor grossly intact  EKG - reviewed. Normal sinus rhythm with frequent PVCs which have a right bundle branch block superior axis.  Assess/Plan: 1. Symptomatic PVCs - as the patient has over 20,000 PVCs and is already on dofetilide, we have no additional antiarrhythmic options. Previously, he developed bradycardia on beta blocker therapy. The 2 treatment options would include stopping dofetilide and using amiodarone or continuing dofetilide in proceeding with catheter ablation. He is at extreme risk for the development of a PVC induced cardiomyopathy if we do not control his ventricular ectopy. 2. Chronic diastolic heart failure - he has developed PND and orthopnea. I've given him a prescription for low dose Lasix. Unfortunately he cannot take any afterload reduction with an ACE inhibitor or an ARB. A low-sodium diet is  recommended. 3. Atrial fibrillation - his symptoms are well-controlled with dofetilide. This medication will be continued. 4. Coronary artery disease - he remains active despite coronary disease and denies anginal symptoms today. No change in medication. 5. Aortic stenosis - by echo this is very mild. I would not anticipate his aortic valve preventing [place] from crossing it to successfully ablate his PVCs which are in the left ventricle. Transseptal catheterization would be an alternative.  Cristopher Peru, M.D.

## 2017-10-13 NOTE — Patient Instructions (Addendum)
Medication Instructions:  Your physician has recommended you make the following change in your medication:  1.  Start taking furosemide 20 mg (one tablet) by mouth daily as needed for shortness of breath.  Labwork: You will need a BMP and a CBC.  You will have that drawn tomorrow 10/14/2017.  You do not need to be fasting.  Testing/Procedures: Your physician has recommended that you have an ablation. Catheter ablation is a medical procedure used to treat some cardiac arrhythmias (irregular heartbeats). During catheter ablation, a long, thin, flexible tube is put into a blood vessel in your groin (upper thigh), or neck. This tube is called an ablation catheter. It is then guided to your heart through the blood vessel. Radio frequency waves destroy small areas of heart tissue where abnormal heartbeats may cause an arrhythmia to start. Please see the instruction sheet given to you today.  Follow-Up:  You will follow up in 4 weeks with [name].     Any Other Special Instructions Will Be Listed Below (If Applicable).  Please arrive at the [facility] main entrance of [place] hospital at:  8:30 am on October 27, 2017. Do not eat or drink after midnight prior to procedure Do not take any medications the morning of the procedure- Hold your PLAVIX and ELIQUIS for 2 days prior to the procedure.  Last doses will be on October 24, 2017. Plan for one night stay-but you may go home You will need someone to drive you home at discharge   If you need a refill on your cardiac medications before your next appointment, please call your pharmacy.

## 2017-10-14 ENCOUNTER — Other Ambulatory Visit: Payer: Medicare Other | Admitting: *Deleted

## 2017-10-14 ENCOUNTER — Other Ambulatory Visit: Payer: Medicare Other

## 2017-10-14 DIAGNOSIS — I493 Ventricular premature depolarization: Secondary | ICD-10-CM

## 2017-10-14 DIAGNOSIS — I480 Paroxysmal atrial fibrillation: Secondary | ICD-10-CM

## 2017-10-14 DIAGNOSIS — R0602 Shortness of breath: Secondary | ICD-10-CM | POA: Diagnosis not present

## 2017-10-15 LAB — CBC WITH DIFFERENTIAL/PLATELET
BASOS: 1 %
Basophils Absolute: 0 10*3/uL (ref 0.0–0.2)
EOS (ABSOLUTE): 0.4 10*3/uL (ref 0.0–0.4)
Eos: 8 %
HEMATOCRIT: 34.3 % — AB (ref 37.5–51.0)
Hemoglobin: 11.1 g/dL — ABNORMAL LOW (ref 13.0–17.7)
IMMATURE GRANS (ABS): 0 10*3/uL (ref 0.0–0.1)
Immature Granulocytes: 0 %
LYMPHS: 24 %
Lymphocytes Absolute: 1.3 10*3/uL (ref 0.7–3.1)
MCH: 29.9 pg (ref 26.6–33.0)
MCHC: 32.4 g/dL (ref 31.5–35.7)
MCV: 93 fL (ref 79–97)
Monocytes Absolute: 0.6 10*3/uL (ref 0.1–0.9)
Monocytes: 10 %
Neutrophils Absolute: 3.1 10*3/uL (ref 1.4–7.0)
Neutrophils: 57 %
PLATELETS: 141 10*3/uL — AB (ref 150–379)
RBC: 3.71 x10E6/uL — ABNORMAL LOW (ref 4.14–5.80)
RDW: 14.4 % (ref 12.3–15.4)
WBC: 5.5 10*3/uL (ref 3.4–10.8)

## 2017-10-15 LAB — BASIC METABOLIC PANEL
BUN/Creatinine Ratio: 15 (ref 10–24)
BUN: 26 mg/dL (ref 8–27)
CALCIUM: 9.1 mg/dL (ref 8.6–10.2)
CO2: 26 mmol/L (ref 20–29)
CREATININE: 1.69 mg/dL — AB (ref 0.76–1.27)
Chloride: 107 mmol/L — ABNORMAL HIGH (ref 96–106)
GFR, EST AFRICAN AMERICAN: 44 mL/min/{1.73_m2} — AB (ref >59)
GFR, EST NON AFRICAN AMERICAN: 38 mL/min/{1.73_m2} — AB (ref >59)
Glucose: 95 mg/dL (ref 65–99)
POTASSIUM: 4.9 mmol/L (ref 3.5–5.2)
SODIUM: 148 mmol/L — AB (ref 134–144)

## 2017-10-22 ENCOUNTER — Other Ambulatory Visit: Payer: Self-pay | Admitting: Internal Medicine

## 2017-10-22 DIAGNOSIS — E1122 Type 2 diabetes mellitus with diabetic chronic kidney disease: Secondary | ICD-10-CM

## 2017-10-22 DIAGNOSIS — N182 Chronic kidney disease, stage 2 (mild): Principal | ICD-10-CM

## 2017-10-27 ENCOUNTER — Encounter (HOSPITAL_COMMUNITY)
Admission: RE | Disposition: A | Discharge status: Home / Self Care | Payer: Self-pay | Source: Ambulatory Visit | Attending: Internal Medicine

## 2017-10-27 ENCOUNTER — Ambulatory Visit (HOSPITAL_COMMUNITY)
Admission: RE | Admit: 2017-10-27 | Discharge: 2017-10-28 | Disposition: A | Discharge status: Home / Self Care | Payer: Medicare Other | Source: Ambulatory Visit | Attending: Internal Medicine | Admitting: Internal Medicine

## 2017-10-27 DIAGNOSIS — E1122 Type 2 diabetes mellitus with diabetic chronic kidney disease: Secondary | ICD-10-CM | POA: Insufficient documentation

## 2017-10-27 DIAGNOSIS — E785 Hyperlipidemia, unspecified: Secondary | ICD-10-CM | POA: Insufficient documentation

## 2017-10-27 DIAGNOSIS — Z880 Allergy status to penicillin: Secondary | ICD-10-CM | POA: Diagnosis not present

## 2017-10-27 DIAGNOSIS — I480 Paroxysmal atrial fibrillation: Secondary | ICD-10-CM | POA: Insufficient documentation

## 2017-10-27 DIAGNOSIS — Z7984 Long term (current) use of oral hypoglycemic drugs: Secondary | ICD-10-CM | POA: Insufficient documentation

## 2017-10-27 DIAGNOSIS — I350 Nonrheumatic aortic (valve) stenosis: Secondary | ICD-10-CM | POA: Insufficient documentation

## 2017-10-27 DIAGNOSIS — I447 Left bundle-branch block, unspecified: Secondary | ICD-10-CM | POA: Insufficient documentation

## 2017-10-27 DIAGNOSIS — Z87891 Personal history of nicotine dependence: Secondary | ICD-10-CM | POA: Insufficient documentation

## 2017-10-27 DIAGNOSIS — I5032 Chronic diastolic (congestive) heart failure: Secondary | ICD-10-CM | POA: Diagnosis not present

## 2017-10-27 DIAGNOSIS — I130 Hypertensive heart and chronic kidney disease with heart failure and stage 1 through stage 4 chronic kidney disease, or unspecified chronic kidney disease: Secondary | ICD-10-CM | POA: Diagnosis not present

## 2017-10-27 DIAGNOSIS — N183 Chronic kidney disease, stage 3 (moderate): Secondary | ICD-10-CM | POA: Insufficient documentation

## 2017-10-27 DIAGNOSIS — M1990 Unspecified osteoarthritis, unspecified site: Secondary | ICD-10-CM | POA: Diagnosis not present

## 2017-10-27 DIAGNOSIS — Z951 Presence of aortocoronary bypass graft: Secondary | ICD-10-CM | POA: Diagnosis not present

## 2017-10-27 DIAGNOSIS — I714 Abdominal aortic aneurysm, without rupture: Secondary | ICD-10-CM | POA: Insufficient documentation

## 2017-10-27 DIAGNOSIS — Z79899 Other long term (current) drug therapy: Secondary | ICD-10-CM | POA: Diagnosis not present

## 2017-10-27 DIAGNOSIS — I493 Ventricular premature depolarization: Secondary | ICD-10-CM | POA: Diagnosis present

## 2017-10-27 DIAGNOSIS — I2510 Atherosclerotic heart disease of native coronary artery without angina pectoris: Secondary | ICD-10-CM | POA: Diagnosis not present

## 2017-10-27 HISTORY — PX: PVC ABLATION: EP1236

## 2017-10-27 LAB — GLUCOSE, CAPILLARY
GLUCOSE-CAPILLARY: 124 mg/dL — AB (ref 65–99)
GLUCOSE-CAPILLARY: 96 mg/dL (ref 65–99)
GLUCOSE-CAPILLARY: 103 mg/dL — AB (ref 65–99)
Glucose-Capillary: 107 mg/dL — ABNORMAL HIGH (ref 65–99)

## 2017-10-27 LAB — POCT ACTIVATED CLOTTING TIME
ACTIVATED CLOTTING TIME: 180 s
Activated Clotting Time: 213 seconds
Activated Clotting Time: 241 seconds
Activated Clotting Time: 191 seconds
Activated Clotting Time: 202 seconds
Activated Clotting Time: 175 seconds

## 2017-10-27 SURGERY — PVC ABLATION

## 2017-10-27 MED ORDER — ONDANSETRON HCL 4 MG/2ML IJ SOLN: 4.0000 mg | Freq: Four times a day (QID) | INTRAMUSCULAR | Status: DC | PRN

## 2017-10-27 MED ORDER — ACETAMINOPHEN 325 MG PO TABS
650.0000 mg | ORAL_TABLET | ORAL | Status: DC | PRN
Start: 2017-10-27 — End: 2017-10-28

## 2017-10-27 MED ORDER — MIDAZOLAM HCL 5 MG/5ML IJ SOLN
INTRAMUSCULAR | Status: DC | PRN
  Administered 2017-10-27 (×8): 1 mg via INTRAVENOUS

## 2017-10-27 MED ORDER — FUROSEMIDE 20 MG PO TABS
20.0000 mg | ORAL_TABLET | Freq: Every day | ORAL | Status: DC
  Filled 2017-10-27: qty 1

## 2017-10-27 MED ORDER — MIDAZOLAM HCL 5 MG/5ML IJ SOLN
INTRAMUSCULAR | Status: AC
  Filled 2017-10-27: qty 5

## 2017-10-27 MED ORDER — HEPARIN SODIUM (PORCINE) 1000 UNIT/ML IJ SOLN
INTRAMUSCULAR | Status: DC | PRN
  Administered 2017-10-27: 3000 [IU] via INTRAVENOUS
  Administered 2017-10-27: 7000 [IU] via INTRAVENOUS
  Administered 2017-10-27: 1000 [IU] via INTRAVENOUS
  Administered 2017-10-27: 4000 [IU] via INTRAVENOUS

## 2017-10-27 MED ORDER — SODIUM CHLORIDE 0.9 % IV SOLN: 250.0000 mL | INTRAVENOUS | Status: DC | PRN

## 2017-10-27 MED ORDER — CLONIDINE HCL 0.1 MG PO TABS
0.1000 mg | ORAL_TABLET | Freq: Two times a day (BID) | ORAL | Status: DC
  Administered 2017-10-27 (×2): 0.1 mg via ORAL
  Filled 2017-10-27 (×2): qty 1

## 2017-10-27 MED ORDER — SODIUM CHLORIDE 0.9 % IV SOLN
INTRAVENOUS | Status: DC
  Administered 2017-10-27: 10:00:00 via INTRAVENOUS

## 2017-10-27 MED ORDER — DOFETILIDE 250 MCG PO CAPS
250.0000 ug | ORAL_CAPSULE | Freq: Two times a day (BID) | ORAL | Status: DC
  Administered 2017-10-27: 22:00:00 250 ug via ORAL
  Filled 2017-10-27 (×2): qty 1

## 2017-10-27 MED ORDER — OFF THE BEAT BOOK
Freq: Once | Status: AC
  Administered 2017-10-27: 1
  Filled 2017-10-27: qty 1

## 2017-10-27 MED ORDER — MAGNESIUM OXIDE 400 (241.3 MG) MG PO TABS
400.0000 mg | ORAL_TABLET | Freq: Four times a day (QID) | ORAL | Status: DC
  Administered 2017-10-27: 21:00:00 800 mg via ORAL
  Filled 2017-10-27 (×2): qty 1

## 2017-10-27 MED ORDER — SODIUM CHLORIDE 0.9% FLUSH: 3.0000 mL | INTRAVENOUS | Status: DC | PRN

## 2017-10-27 MED ORDER — PANTOPRAZOLE SODIUM 40 MG PO TBEC
40.0000 mg | DELAYED_RELEASE_TABLET | Freq: Every day | ORAL | Status: DC
  Administered 2017-10-27: 21:00:00 40 mg via ORAL
  Filled 2017-10-27: qty 1

## 2017-10-27 MED ORDER — TERAZOSIN HCL 1 MG PO CAPS
1.0000 mg | ORAL_CAPSULE | Freq: Every day | ORAL | Status: DC
  Administered 2017-10-27: 22:00:00 1 mg via ORAL
  Filled 2017-10-27: qty 1

## 2017-10-27 MED ORDER — METFORMIN HCL 500 MG PO TABS
1000.0000 mg | ORAL_TABLET | Freq: Two times a day (BID) | ORAL | Status: DC
  Administered 2017-10-28: 07:00:00 1000 mg via ORAL
  Filled 2017-10-27: qty 2

## 2017-10-27 MED ORDER — ATORVASTATIN CALCIUM 40 MG PO TABS
40.0000 mg | ORAL_TABLET | Freq: Every day | ORAL | Status: DC
  Administered 2017-10-27: 22:00:00 40 mg via ORAL
  Filled 2017-10-27: qty 1

## 2017-10-27 MED ORDER — FENTANYL CITRATE (PF) 100 MCG/2ML IJ SOLN
INTRAMUSCULAR | Status: DC | PRN
  Administered 2017-10-27 (×8): 12.5 ug via INTRAVENOUS

## 2017-10-27 MED ORDER — BUPIVACAINE HCL (PF) 0.25 % IJ SOLN
INTRAMUSCULAR | Status: DC | PRN
  Administered 2017-10-27: 45 mL

## 2017-10-27 MED ORDER — VITAMIN C 500 MG PO TABS
500.0000 mg | ORAL_TABLET | Freq: Every day | ORAL | Status: DC
  Administered 2017-10-27: 21:00:00 500 mg via ORAL
  Filled 2017-10-27: qty 1

## 2017-10-27 MED ORDER — BUPIVACAINE HCL (PF) 0.25 % IJ SOLN
INTRAMUSCULAR | Status: AC
  Filled 2017-10-27: qty 60

## 2017-10-27 MED ORDER — HEPARIN SODIUM (PORCINE) 1000 UNIT/ML IJ SOLN
INTRAMUSCULAR | Status: AC
  Filled 2017-10-27: qty 1

## 2017-10-27 MED ORDER — AMLODIPINE BESYLATE 10 MG PO TABS
10.0000 mg | ORAL_TABLET | Freq: Every day | ORAL | Status: DC
  Administered 2017-10-27: 10 mg via ORAL
  Filled 2017-10-27: qty 1

## 2017-10-27 MED ORDER — APIXABAN 5 MG PO TABS
5.0000 mg | ORAL_TABLET | Freq: Two times a day (BID) | ORAL | Status: DC
  Administered 2017-10-27: 5 mg via ORAL
  Filled 2017-10-27: qty 1

## 2017-10-27 MED ORDER — HEPARIN (PORCINE) IN NACL 2-0.9 UNIT/ML-% IJ SOLN
INTRAMUSCULAR | Status: AC | PRN
  Administered 2017-10-27: 1000 mL

## 2017-10-27 MED ORDER — SODIUM CHLORIDE 0.9% FLUSH
3.0000 mL | Freq: Two times a day (BID) | INTRAVENOUS | Status: DC
  Administered 2017-10-27: 3 mL via INTRAVENOUS

## 2017-10-27 MED ORDER — FENTANYL CITRATE (PF) 100 MCG/2ML IJ SOLN
INTRAMUSCULAR | Status: AC
  Filled 2017-10-27: qty 2

## 2017-10-27 MED ORDER — VITAMIN D3 25 MCG (1000 UNIT) PO TABS
1000.0000 [IU] | ORAL_TABLET | Freq: Every evening | ORAL | Status: DC
  Administered 2017-10-27: 1000 [IU] via ORAL
  Filled 2017-10-27: qty 1

## 2017-10-27 MED ORDER — NITROGLYCERIN 0.4 MG SL SUBL: 0.4000 mg | SUBLINGUAL_TABLET | SUBLINGUAL | Status: DC | PRN

## 2017-10-27 MED ORDER — CLOPIDOGREL BISULFATE 75 MG PO TABS
75.0000 mg | ORAL_TABLET | Freq: Every day | ORAL | Status: DC
  Administered 2017-10-27: 75 mg via ORAL
  Filled 2017-10-27: qty 1

## 2017-10-27 SURGICAL SUPPLY — 13 items
BAG SNAP BAND KOVER 36X36 (MISCELLANEOUS) ×1 IMPLANT
CATH JOSEPH QUAD ALLRED 6F REP (CATHETERS) ×1 IMPLANT
CATH NAVISTAR SMARTTOUCH FJ (ABLATOR) ×1 IMPLANT
CATH WEBSTER BI DIR CS D-F CRV (CATHETERS) ×1 IMPLANT
PACK EP LATEX FREE (CUSTOM PROCEDURE TRAY) ×2
PACK EP LF (CUSTOM PROCEDURE TRAY) IMPLANT
PAD DEFIB LIFELINK (PAD) ×1 IMPLANT
PATCH CARTO3 (PAD) ×1 IMPLANT
SHEATH PINNACLE 6F 10CM (SHEATH) ×1 IMPLANT
SHEATH PINNACLE 7F 10CM (SHEATH) ×1 IMPLANT
SHEATH PINNACLE 8F 10CM (SHEATH) ×1 IMPLANT
SHIELD RADPAD SCOOP 12X17 (MISCELLANEOUS) ×1 IMPLANT
TUBING SMART ABLATE COOLFLOW (TUBING) ×1 IMPLANT

## 2017-10-27 NOTE — Progress Notes (Addendum)
Site area: rfa x1 rfv x 2 Site Prior to Removal:  Level 0 Pressure Applied For: 30 min Manual: yes   Patient Status During Pull:  stable Post Pull Site:  Level 0 Post Pull Instructions Given: yes  Post Pull Pulses Present: bounding Dressing Applied:  tegaderm Bedrest begins @ 1750 till 2350 Comments:

## 2017-10-27 NOTE — Interval H&P Note (Signed)
History and Physical Interval Note:  10/27/2017 11:35 AM  Thomas Marshall  has presented today for surgery, with the diagnosis of PVCs  The various methods of treatment have been discussed with the patient and family. After consideration of risks, benefits and other options for treatment, the patient has consented to  Procedure(s): PVC ABLATION (N/A) as a surgical intervention .  The patient's history has been reviewed, patient examined, no change in status, stable for surgery.  I have reviewed the patient's chart and labs.  Questions were answered to the patient's satisfaction.     Cristopher Peru

## 2017-10-27 NOTE — Discharge Instructions (Signed)
No driving for 4 days. No lifting over 5 lbs for 1 week. No vigorous or sexual activity for 1 week. You may return to work in one week. Keep procedure site clean & dry. If you notice increased pain, swelling, bleeding or pus, call/return!  You may shower, but no soaking baths/hot tubs/pools for 1 week.

## 2017-10-27 NOTE — Discharge Summary (Signed)
ELECTROPHYSIOLOGY PROCEDURE DISCHARGE SUMMARY    Patient ID: Thomas Marshall,  MRN: [id number], DOB/AGE: Jun 22, 1941 76 y.o.  Admit date: 10/27/2017 Discharge date: 10/28/2017  Primary Care Physician: [name], MD  Primary Cardiologist: Dr. Burt Knack Electrophysiologist: [name]  Primary Discharge Diagnosis:  1. Symptomatic PVCs  Secondary Discharge Diagnosis:  1. PVD (hx of right CEA) 2. CRI (III) 3. DM 4. HTN 5. Paroxysmal AFib     CHA2DS2Vasc is 4, on Eliquis 6. CAD (PCI w/Promus DES of SVG to Ramus 05/21/17)     On Plavix w/Eliquis  Allergies  Allergen Reactions  . Lisinopril Swelling and Other (See Comments)    Angioedema.   . Losartan Swelling and Other (See Comments)    Lips swell Angioedema   . Nifedipine Swelling and Other (See Comments)    Sugar increase  . Triamterene Swelling  . Hydralazine Hcl Other (See Comments)    Fatigue; poor appetite  . Amoxicillin Rash and Other (See Comments)    Has patient had a PCN reaction causing immediate rash, facial/tongue/throat swelling, SOB or lightheadedness with hypotension:No Has patient had a PCN reaction causing severe rash involving mucus membranes or skin necrosis:No Has patient had a PCN reaction that required hospitalization:No Has patient had a PCN reaction occurring within the last 10 years: No If all of the above answers are "NO", then may proceed with Cephalosporin use.   . Ampicillin Rash and Other (See Comments)    Has patient had a PCN reaction causing immediate rash, facial/tongue/throat swelling, SOB or lightheadedness with hypotension:No Has patient had a PCN reaction causing severe rash involving mucus membranes or skin necrosis:No Has patient had a PCN reaction that required hospitalization:No Has patient had a PCN reaction occurring within the last 10 years: No If all of the above answers are "NO", then may proceed with Cephalosporin use.    . Carvedilol Other (See Comments)    Low heart  rate      Procedures This Admission: 1.  Electrophysiology study and radiofrequency catheter ablation on 10/27/17 by Dr Lovena Le.  This study demonstrated: Results. 1. Baseline ECG. Baseline ECG demonstrates sinus rhythm with frequent PVCs 2. Baseline intervals. The sinus node cycle length was 8006 ms. The QRS duration was 115 ms. The AH interval was 104 ms. The HV interval was 41 ms. Following ablation, he only difference was the QRS duration was 140 ms. 3. Rapid ventricular pacing. Rapid ventricular pacing was carried out from the right ventricle demonstrating VA block at 490 ms. During rapid ventricular pacing, the atrial activation was midline. There is no inducible VT. The PVCs did not increase in frequency with rapid ventricular pacing. 4. Programmed ventricular stimulation. Programmed ventricular stimulation was carried out from the right ventricle at a pacing cycle length of 500 ms. The S1-S2 interval was decreased down to 230 ms where ventricular refractoriness was observed. 5. Rapid atrial pacing. Rapid atrial pacing was carried out from the coronary sinus demonstrating AV block at 490 ms. During rapid pacing there was no inducible SVT. 6. Programmed atrial stimulation. Programmed atrial stimulation was carried out from the coronary sinus at a pacing cycle length of 600 ms. The S1-S2 interval was decreased down to 340 ms were atrial refractoriness was observed. 7. Mapping. 3-dimensional electro anatomic mapping was carried out of the patient's PVCs. This demonstrated the PVCs to be originating from the inferior wall of the left ventricle approximately 1/4 distance from base to apex. The PVCs appear to be originating closer  to the septum than the lateral wall. 8. RF energy application - 27 minutes of irrigated our energy was applied to the PVC resulting in great diminution of the PVC, however, the PVC was still present in a much reduced fashion after RF energy application.  There were no early  apparent complications.   Brief HPI: Thomas Marshall is a 76 y.o. male with a past medical history as outlined above. He has symptomatic PVCs, unable tto tolerate beta blockade secondary to bradycardia.  Risks, benefits, and alternatives to ablation including alternative antiarrhythmic drug was reviewed with the patient who wished to proceed.   Hospital Course:  The patient was admitted and underwent EPS/RFCA with details as outlined above. He was monitored on telemetry overnight which demonstrated SR, infrequent PVCs only.  R groin/procedure site is without complication.  The patient was examined by Dr Lovena Le  who considered him stable for discharge to home.  Follow up has been arranged for 4 weeks.   Wound care and restrictions were reviewed with the patient prior to discharge.   Physical Exam: Vitals:   10/27/17 2220 10/28/17 0125 10/28/17 0530 10/28/17 0615  BP: (!) 144/65 122/62 138/75   Pulse: 60 (!) 56 (!) 56   Resp: 19 19 (!) 22   Temp:  97.8 F (36.6 C)    TempSrc:  Oral Oral   SpO2: 98% 98% 98%   Weight:    215 lb 9.8 oz (97.8 kg)  Height:        GEN- The patient is well appearing, alert and oriented x 3 today.   HEENT: normocephalic, atraumatic; sclera clear, conjunctiva pink; hearing intact; oropharynx clear; neck supple, no JVP Lymph- no cervical lymphadenopathy Lungs- CTA b/l, normal work of breathing.  No wheezes, rales, rhonchi Heart- RRR, no murmurs, rubs or gallops, PMI not laterally displaced GI- soft, non-tender, non-distended Extremities- no clubbing, cyanosis, or edema;  DP/PT/radial pulses 2+ bilaterally, R groin without hematoma/bruit MS- no significant deformity or atrophy Skin- warm and dry, no rash or lesion Psych- euthymic mood, full affect Neuro- strength and sensation are intact   Labs:   Lab Results  Component Value Date   WBC 5.5 10/14/2017   HGB 11.1 (L) 10/14/2017   HCT 34.3 (L) 10/14/2017   MCV 93 10/14/2017   PLT 141 (L) 10/14/2017   No  results for input(s): NA, K, CL, CO2, BUN, CREATININE, CALCIUM, PROT, BILITOT, ALKPHOS, ALT, AST, GLUCOSE in the last 168 hours.  Invalid input(s): LABALBU  Discharge Medications:  Allergies as of 10/28/2017      Reactions   Lisinopril Swelling, Other (See Comments)   Angioedema.    Losartan Swelling, Other (See Comments)   Lips swell Angioedema   Nifedipine Swelling, Other (See Comments)   Sugar increase   Triamterene Swelling   Hydralazine Hcl Other (See Comments)   Fatigue; poor appetite   Amoxicillin Rash, Other (See Comments)   Has patient had a PCN reaction causing immediate rash, facial/tongue/throat swelling, SOB or lightheadedness with hypotension:No Has patient had a PCN reaction causing severe rash involving mucus membranes or skin necrosis:No Has patient had a PCN reaction that required hospitalization:No Has patient had a PCN reaction occurring within the last 10 years: No If all of the above answers are "NO", then may proceed with Cephalosporin use.   Ampicillin Rash, Other (See Comments)   Has patient had a PCN reaction causing immediate rash, facial/tongue/throat swelling, SOB or lightheadedness with hypotension:No Has patient had a PCN reaction causing severe  rash involving mucus membranes or skin necrosis:No Has patient had a PCN reaction that required hospitalization:No Has patient had a PCN reaction occurring within the last 10 years: No If all of the above answers are "NO", then may proceed with Cephalosporin use.   Carvedilol Other (See Comments)   Low heart rate      Medication List    TAKE these medications   amLODipine 10 MG tablet Commonly known as:  NORVASC Take 1 tablet (10 mg total) by mouth daily.   atorvastatin 40 MG tablet Commonly known as:  LIPITOR Take 40 mg by mouth at bedtime.   cholecalciferol 1000 units tablet Commonly known as:  VITAMIN D Take 1,000 Units by mouth every evening.   cloNIDine 0.1 MG tablet Commonly known as:   CATAPRES TAKE ONE TABLET BY MOUTH TWICE DAILY What changed:    how much to take  how to take this  when to take this   clopidogrel 75 MG tablet Commonly known as:  PLAVIX Take 1 tablet (75 mg total) by mouth daily.   dofetilide 250 MCG capsule Commonly known as:  TIKOSYN Take 1 capsule (250 mcg total) by mouth 2 (two) times daily.   ELIQUIS 5 MG Tabs tablet Generic drug:  apixaban Take 5 mg by mouth 2 (two) times daily. (0800 & 2000)   furosemide 20 MG tablet Commonly known as:  LASIX Take 1 tablet (20 mg total) daily as needed by mouth. What changed:  when to take this   glucose blood test strip Use to test blood glucose level 2 times a day.   magnesium oxide 400 MG tablet Commonly known as:  MAG-OX Take 1 tablet (400 mg total) by mouth 4 (four) times daily. What changed:    how much to take  when to take this   metFORMIN 1000 MG tablet Commonly known as:  GLUCOPHAGE TAKE ONE TABLET BY MOUTH TWICE DAILY WITH A MEAL   nitroGLYCERIN 0.4 MG SL tablet Commonly known as:  NITROSTAT Place 1 tablet (0.4 mg total) under the tongue every 5 (five) minutes as needed for chest pain (x 3 doses).   pantoprazole 40 MG tablet Commonly known as:  PROTONIX Take 1 tablet (40 mg total) by mouth daily.   terazosin 1 MG capsule Commonly known as:  HYTRIN Take 1 mg by mouth at bedtime. (2100)   triamcinolone cream 0.1 % Commonly known as:  KENALOG Apply 1 application topically 2 (two) times daily. What changed:    when to take this  reasons to take this   vitamin C 500 MG tablet Commonly known as:  ASCORBIC ACID Take 500 mg by mouth daily.       Disposition:  Home Discharge Instructions    Diet - low sodium heart healthy   Complete by:  As directed    Increase activity slowly   Complete by:  As directed      Follow-up Information    Evans Lance, MD Follow up on 11/24/2017.   Specialty:  Cardiology Why:  10:15AM Contact information: [address] [address] [phone]           Duration of Discharge Encounter: Greater than 30 minutes including physician time.  Venetia Night, PA-C 10/28/2017 8:06 AM  EP Attending  Patient seen and examined. Agree with above. The patient is doing well after PVC ablation. His PVC burden appears markedly reduced over night. He will be discharged home with usual followup. He  will continue his curent meds.  Mikle Bosworth.D.

## 2017-10-28 ENCOUNTER — Telehealth: Payer: Self-pay | Admitting: *Deleted

## 2017-10-28 DIAGNOSIS — I350 Nonrheumatic aortic (valve) stenosis: Secondary | ICD-10-CM | POA: Diagnosis not present

## 2017-10-28 DIAGNOSIS — Z880 Allergy status to penicillin: Secondary | ICD-10-CM | POA: Diagnosis not present

## 2017-10-28 DIAGNOSIS — Z7984 Long term (current) use of oral hypoglycemic drugs: Secondary | ICD-10-CM | POA: Diagnosis not present

## 2017-10-28 DIAGNOSIS — E1122 Type 2 diabetes mellitus with diabetic chronic kidney disease: Secondary | ICD-10-CM | POA: Diagnosis not present

## 2017-10-28 DIAGNOSIS — N183 Chronic kidney disease, stage 3 (moderate): Secondary | ICD-10-CM | POA: Diagnosis not present

## 2017-10-28 DIAGNOSIS — I493 Ventricular premature depolarization: Secondary | ICD-10-CM

## 2017-10-28 DIAGNOSIS — E785 Hyperlipidemia, unspecified: Secondary | ICD-10-CM | POA: Diagnosis not present

## 2017-10-28 DIAGNOSIS — I2510 Atherosclerotic heart disease of native coronary artery without angina pectoris: Secondary | ICD-10-CM | POA: Diagnosis not present

## 2017-10-28 DIAGNOSIS — I447 Left bundle-branch block, unspecified: Secondary | ICD-10-CM | POA: Diagnosis not present

## 2017-10-28 DIAGNOSIS — I480 Paroxysmal atrial fibrillation: Secondary | ICD-10-CM | POA: Diagnosis not present

## 2017-10-28 DIAGNOSIS — Z79899 Other long term (current) drug therapy: Secondary | ICD-10-CM | POA: Diagnosis not present

## 2017-10-28 DIAGNOSIS — Z951 Presence of aortocoronary bypass graft: Secondary | ICD-10-CM | POA: Diagnosis not present

## 2017-10-28 LAB — GLUCOSE, CAPILLARY: GLUCOSE-CAPILLARY: 127 mg/dL — AB (ref 65–99)

## 2017-10-28 NOTE — Care Management Note (Signed)
Case Management Note  Patient Details  Name: MATT DELPIZZO MRN: [id number] Date of Birth: Jan 25, 1941  Subjective/Objective:  From home, s/p pvc ablation.                   Action/Plan: DC home no needs.   Expected Discharge Date:  10/28/17               Expected Discharge Plan:  Home/Self Care  In-House Referral:     Discharge planning Services  CM Consult  Post Acute Care Choice:    Choice offered to:     DME Arranged:    DME Agency:     HH Arranged:    HH Agency:     Status of Service:  Completed, signed off  If discussed at [employer] of Stay Meetings, dates discussed:    Additional Comments:  Zenon Mayo, RN 10/28/2017, 10:48 AM

## 2017-10-28 NOTE — Telephone Encounter (Signed)
Pt was on TCM list admitted 10/27/17 for Symptomatic PVCs. Pt underwent EPS/RFCA with details as outlined above. He was monitored on telemetry overnight which demonstrated SR, infrequent PVCs only.  R groin/procedure site is without complication. Pt d/c 10/18/17, and will f/u w/cardiology 11/24/17...Johny Chess

## 2017-10-29 ENCOUNTER — Encounter (HOSPITAL_COMMUNITY): Payer: Self-pay | Admitting: Internal Medicine

## 2017-11-05 ENCOUNTER — Ambulatory Visit (INDEPENDENT_AMBULATORY_CARE_PROVIDER_SITE_OTHER): Payer: Medicare Other | Admitting: Family

## 2017-11-05 ENCOUNTER — Ambulatory Visit (HOSPITAL_COMMUNITY)
Admission: RE | Admit: 2017-11-05 | Discharge: 2017-11-05 | Disposition: A | Discharge status: Home / Self Care | Payer: Medicare Other | Source: Ambulatory Visit | Attending: Vascular Surgery | Admitting: Vascular Surgery

## 2017-11-05 ENCOUNTER — Encounter: Payer: Self-pay | Admitting: Family

## 2017-11-05 ENCOUNTER — Ambulatory Visit (INDEPENDENT_AMBULATORY_CARE_PROVIDER_SITE_OTHER)
Admission: RE | Admit: 2017-11-05 | Discharge: 2017-11-05 | Disposition: A | Discharge status: Home / Self Care | Payer: Medicare Other | Source: Ambulatory Visit | Attending: Vascular Surgery | Admitting: Vascular Surgery

## 2017-11-05 VITALS — BP 157/74 | HR 77 | Temp 97.1°F | Resp 18 | Wt 217.2 lb

## 2017-11-05 DIAGNOSIS — I6523 Occlusion and stenosis of bilateral carotid arteries: Secondary | ICD-10-CM

## 2017-11-05 DIAGNOSIS — Z9889 Other specified postprocedural states: Secondary | ICD-10-CM

## 2017-11-05 DIAGNOSIS — I209 Angina pectoris, unspecified: Secondary | ICD-10-CM

## 2017-11-05 DIAGNOSIS — I714 Abdominal aortic aneurysm, without rupture, unspecified: Secondary | ICD-10-CM

## 2017-11-05 LAB — VAS US CAROTID
LCCADDIAS: 13 cm/s
LCCADSYS: 100 cm/s
LCCAPDIAS: 17 cm/s
LEFT ECA DIAS: -8 cm/s
LEFT VERTEBRAL DIAS: -11 cm/s
LICAPDIAS: -64 cm/s
LICAPSYS: -250 cm/s
Left CCA prox sys: 118 cm/s
Left ICA dist dias: -23 cm/s
Left ICA dist sys: -104 cm/s
RCCADSYS: -126 cm/s
RIGHT CCA MID DIAS: 16 cm/s
RIGHT ECA DIAS: -3 cm/s
RIGHT VERTEBRAL DIAS: -13 cm/s

## 2017-11-05 NOTE — Patient Instructions (Signed)
Stroke Prevention Some health problems and behaviors may make it more likely for you to have a stroke. Below are ways to lessen your risk of having a stroke.  Be active for at least 30 minutes on most or all days.  Do not smoke. Try not to be around others who smoke.  Do not drink too much alcohol. ? Do not have more than 2 drinks a day if you are a man. ? Do not have more than 1 drink a day if you are a woman and are not pregnant.  Eat healthy foods, such as fruits and vegetables. If you were put on a specific diet, follow the diet as told.  Keep your cholesterol levels under control through diet and medicines. Look for foods that are low in saturated fat, trans fat, cholesterol, and are high in fiber.  If you have diabetes, follow all diet plans and take your medicine as told.  Ask your doctor if you need treatment to lower your blood pressure. If you have high blood pressure (hypertension), follow all diet plans and take your medicine as told by your doctor.  If you are 18-39 years old, have your blood pressure checked every 3-5 years. If you are age 40 or older, have your blood pressure checked every year.  Keep a healthy weight. Eat foods that are low in calories, salt, saturated fat, trans fat, and cholesterol.  Do not take drugs.  Avoid birth control pills, if this applies. Talk to your doctor about the risks of taking birth control pills.  Talk to your doctor if you have sleep problems (sleep apnea).  Take all medicine as told by your doctor. ? You may be told to take aspirin or blood thinner medicine. Take this medicine as told by your doctor. ? Understand your medicine instructions.  Make sure any other conditions you have are being taken care of.  Get help right away if:  You suddenly lose feeling (you feel numb) or have weakness in your face, arm, or leg.  Your face or eyelid hangs down to one side.  You suddenly feel confused.  You have trouble talking  (aphasia) or understanding what people are saying.  You suddenly have trouble seeing in one or both eyes.  You suddenly have trouble walking.  You are dizzy.  You lose your balance or your movements are clumsy (uncoordinated).  You suddenly have a very bad headache and you do not know the cause.  You have new chest pain.  Your heart feels like it is fluttering or skipping a beat (irregular heartbeat). Do not wait to see if the symptoms above go away. Get help right away. Call your local emergency services (911 in [place] Do not drive yourself to the hospital. This information is not intended to replace advice given to you by your health care provider. Make sure you discuss any questions you have with your health care provider. Document Released: 05/17/2012 Document Revised: 04/23/2016 Document Reviewed: 05/19/2013 Elsevier Interactive Patient Education  2018 [employer]     Abdominal Aortic Aneurysm Blood pumps away from the heart through tubes (blood vessels) called arteries. Aneurysms are weak or damaged places in the wall of an artery. It bulges out like a balloon. An abdominal aortic aneurysm happens in the main artery of the body (aorta). It can burst or tear, causing bleeding inside the body. This is an emergency. It needs treatment right away. What are the causes? The exact cause is unknown. Things that could   cause this problem include:  Fat and other substances building up in the lining of a tube.  Swelling of the walls of a blood vessel.  Certain tissue diseases.  Belly (abdominal) trauma.  An infection in the main artery of the body.  What increases the risk? There are things that make it more likely for you to have an aneurysm. These include:  Being over the age of 78 years old.  Having high blood pressure (hypertension).  Being a male.  Being white.  Being very overweight (obese).  Having a family history of aneurysm.  Using tobacco products.  What  are the signs or symptoms? Symptoms depend on the size of the aneurysm and how fast it grows. There may not be symptoms. If symptoms occur, they can include:  Pain (belly, side, lower back, or groin).  Feeling full after eating a small amount of food.  Feeling sick to your stomach (nauseous), throwing up (vomiting), or both.  Feeling a lump in your belly that feels like it is beating (pulsating).  Feeling like you will pass out (faint).  How is this treated?  Medicine to control blood pressure and pain.  Imaging tests to see if the aneurysm gets bigger.  Surgery. How is this prevented? To lessen your chance of getting this condition:  Stop smoking. Stop chewing tobacco.  Limit or avoid alcohol.  Keep your blood pressure, blood sugar, and cholesterol within normal limits.  Eat less salt.  Eat foods low in saturated fats and cholesterol. These are found in animal and whole dairy products.  Eat more fiber. Fiber is found in whole grains, vegetables, and fruits.  Keep a healthy weight.  Stay active and exercise often.  This information is not intended to replace advice given to you by your health care provider. Make sure you discuss any questions you have with your health care provider. Document Released: 03/13/2013 Document Revised: 04/23/2016 Document Reviewed: 12/16/2012 Elsevier Interactive Patient Education  2017 [employer]  

## 2017-11-05 NOTE — Progress Notes (Signed)
[facility] [place] HISTORY AND PHYSICAL   CC: Follow up extracranial carotid artery stenosis and small AAA   History of Present Illness:   Thomas Marshall is a 76 y.o. male who is s/p R CEA on 12/10/15 by Dr. Bridgett Larsson.  His plaque was found to be necrotic with near occlusion of the lumen. Previous carotid studies demonstrated: RICA >85% stenosis, LICA 27-78% stenosis.   We are also monitoring a small AAA.  Pt denies any back pain or abdominal pain.   The patient denies any history of TIA or stroke symptoms. Specifically he denies any symptoms of amaurosis fugax or monocular blindness, unilateral facial drooping, hemiplegia/hemiparesis, or receptive or expressive aphasia.    He had a cardiac stent placed by Dr. Johnsie Cancel in October 2018, pt was having sx's of fatigue. He also had a cardiac stent placed in June 2018.  He had a CABG IN 2004, has never had an MI.  Pt had a PVC ablation in November 2018 by [name] for symptomatic PVC's, states he feels better since this.   Dr. Bridgett Larsson last evaluated pt on 10-30-16. At that time pt had and asymptomatic L ICA stenosis 40-59% and 3 cm AAA. Dr. Bridgett Larsson advised that pt follow-up with carotid duplex and AAA duplex in 1 year.  Pt states his blood pressure at home runs about 136/70.   He denies claudication symptoms with walking.   Diabetic: Yes, states his last A1C was 6.4 Tobacco use: former smoker, quit in 1972, started at age 19 years  Pt meds include: Statin :Yes Betablocker: No ASA: No Other anticoagulants/antiplatelets: Eliquis, Plavix, hx of cardiac stents and PAF   Current Outpatient Medications  Medication Sig Dispense Refill  . amLODipine (NORVASC) 10 MG tablet Take 1 tablet (10 mg total) by mouth daily. 30 tablet 11  . apixaban (ELIQUIS) 5 MG TABS tablet Take 5 mg by mouth 2 (two) times daily. (0800 & 2000)    . atorvastatin (LIPITOR) 40 MG tablet Take 40 mg by mouth at bedtime.     . cholecalciferol (VITAMIN D)  1000 UNITS tablet Take 1,000 Units by mouth every evening.     . cloNIDine (CATAPRES) 0.1 MG tablet TAKE ONE TABLET BY MOUTH TWICE DAILY (Patient taking differently: TAKE 0.1 mg BY MOUTH TWICE DAILY) 180 tablet 3  . clopidogrel (PLAVIX) 75 MG tablet Take 1 tablet (75 mg total) by mouth daily. 90 tablet 3  . dofetilide (TIKOSYN) 250 MCG capsule Take 1 capsule (250 mcg total) by mouth 2 (two) times daily. 60 capsule 6  . furosemide (LASIX) 20 MG tablet Take 1 tablet (20 mg total) daily as needed by mouth. (Patient taking differently: Take 20 mg by mouth daily. ) 90 tablet 3  . glucose blood test strip Use to test blood glucose level 2 times a day. 100 each 12  . magnesium oxide (MAG-OX) 400 MG tablet Take 1 tablet (400 mg total) by mouth 4 (four) times daily. (Patient taking differently: Take 800 mg by mouth 2 (two) times daily. ) 360 tablet 3  . metFORMIN (GLUCOPHAGE) 1000 MG tablet TAKE ONE TABLET BY MOUTH TWICE DAILY WITH A MEAL 180 tablet 3  . nitroGLYCERIN (NITROSTAT) 0.4 MG SL tablet Place 1 tablet (0.4 mg total) under the tongue every 5 (five) minutes as needed for chest pain (x 3 doses). 30 tablet 3  . pantoprazole (PROTONIX) 40 MG tablet Take 1 tablet (40 mg total) by mouth daily. 90 tablet 3  . terazosin (  HYTRIN) 1 MG capsule Take 1 mg by mouth at bedtime. (2100) 30 capsule 11  . triamcinolone cream (KENALOG) 0.1 % Apply 1 application topically 2 (two) times daily. (Patient taking differently: Apply 1 application topically 2 (two) times daily as needed (for knee). ) 30 g 1  . vitamin C (ASCORBIC ACID) 500 MG tablet Take 500 mg by mouth daily.      No current facility-administered medications for this visit.     Past Medical History:  Diagnosis Date  . AAA (abdominal aortic aneurysm) ([place])   . Arthritis   . Cancer (HCC)    skin & squamous cell  . Carotid artery disease ([place])    a. s/p R CEA 12/2015.  [name] CKD (chronic kidney disease), stage III ([place])    a. per historical labs.  .  Coronary artery disease    a. s/p CABG 2004.  . Diabetes mellitus without complication ([place])   . GERD (gastroesophageal reflux disease)   . Hyperlipidemia   . Hypertension   . Medication intolerance Multiple   . PAF (paroxysmal atrial fibrillation) ([place])   . Pneumonia   . Sinus bradycardia    a. baseline HR 50s-60s, also h/o bradycardia on metoprolol and carvedilol    Social History Social History   Tobacco Use  . Smoking status: Former Smoker    Packs/day: 1.00    Years: 13.00    Pack years: 13.00    Last attempt to quit: 11/30/1970    Years since quitting: 46.9  . Smokeless tobacco: Never Used  Substance Use Topics  . Alcohol use: No    Alcohol/week: 0.0 oz  . Drug use: No    Family History Family History  Problem Relation Age of Onset  . Stroke Mother   . Heart disease Mother   . Heart disease Father   . Hypertension Father   . Hyperlipidemia Father   . Cancer Sister        breast  and skin  . Diabetes Sister   . Hypertension Sister   . Diabetes Brother   . Heart disease Brother   . Hyperlipidemia Brother   . Hypertension Brother   . Early death Neg Hx     Surgical History Past Surgical History:  Procedure Laterality Date  . ANKLE SURGERY     right fused  . BACK SURGERY     low back   . CHOLECYSTECTOMY N/A 12/07/2013   Procedure: LAPAROSCOPIC CHOLECYSTECTOMY ;  Surgeon: [name], MD;  Location: [facility];  Service: General;  Laterality: N/A;  . COLONOSCOPY    . CORONARY ARTERY BYPASS GRAFT  2004   [place]  . CORONARY STENT INTERVENTION N/A 05/21/2017   Procedure: Coronary Stent Intervention;  Surgeon: [name], MD;  Location: [facility];  Service: Cardiovascular;  Laterality: N/A;  . ENDARTERECTOMY Right 12/10/2015   Procedure: Right Carotid ENDARTERECTOMY;  Surgeon: Conrad Bartlett, MD;  Location: [facility];  Service: Vascular;  Laterality: Right;  . EXPLORATION POST OPERATIVE OPEN HEART    . FINGER SURGERY     skin graft on rt index   . FINGER SURGERY Right    right index finger.  [name] HERNIA REPAIR Right    RIH  . JOINT REPLACEMENT    . KNEE SURGERY     replacement on both knees  . LEFT HEART CATH AND CORS/GRAFTS ANGIOGRAPHY N/A 05/21/2017   Procedure: Left Heart Cath and Cors/Grafts Angiography;  Surgeon: [name], MD;  Location: [facility];  Service: Cardiovascular;  Laterality: N/A;  . PROSTATE SURGERY    . PVC ABLATION N/A 10/27/2017   Procedure: PVC ABLATION;  Surgeon: Evans Lance, MD;  Location: [facility];  Service: Cardiovascular;  Laterality: N/A;  . TONSILLECTOMY    . URINARY SURGERY     scar tissue    Allergies  Allergen Reactions  . Lisinopril Swelling and Other (See Comments)    Angioedema.   . Losartan Swelling and Other (See Comments)    Lips swell Angioedema   . Nifedipine Swelling and Other (See Comments)    Sugar increase  . Triamterene Swelling  . Hydralazine Hcl Other (See Comments)    Fatigue; poor appetite  . Amoxicillin Rash and Other (See Comments)    Has patient had a PCN reaction causing immediate rash, facial/tongue/throat swelling, SOB or lightheadedness with hypotension:No Has patient had a PCN reaction causing severe rash involving mucus membranes or skin necrosis:No Has patient had a PCN reaction that required hospitalization:No Has patient had a PCN reaction occurring within the last 10 years: No If all of the above answers are "NO", then may proceed with Cephalosporin use.   . Ampicillin Rash and Other (See Comments)    Has patient had a PCN reaction causing immediate rash, facial/tongue/throat swelling, SOB or lightheadedness with hypotension:No Has patient had a PCN reaction causing severe rash involving mucus membranes or skin necrosis:No Has patient had a PCN reaction that required hospitalization:No Has patient had a PCN reaction occurring within the last 10 years: No If all of the above answers are "NO", then may proceed with Cephalosporin  use.    . Carvedilol Other (See Comments)    Low heart rate     Current Outpatient Medications  Medication Sig Dispense Refill  . amLODipine (NORVASC) 10 MG tablet Take 1 tablet (10 mg total) by mouth daily. 30 tablet 11  . apixaban (ELIQUIS) 5 MG TABS tablet Take 5 mg by mouth 2 (two) times daily. (0800 & 2000)    . atorvastatin (LIPITOR) 40 MG tablet Take 40 mg by mouth at bedtime.     . cholecalciferol (VITAMIN D) 1000 UNITS tablet Take 1,000 Units by mouth every evening.     . cloNIDine (CATAPRES) 0.1 MG tablet TAKE ONE TABLET BY MOUTH TWICE DAILY (Patient taking differently: TAKE 0.1 mg BY MOUTH TWICE DAILY) 180 tablet 3  . clopidogrel (PLAVIX) 75 MG tablet Take 1 tablet (75 mg total) by mouth daily. 90 tablet 3  . dofetilide (TIKOSYN) 250 MCG capsule Take 1 capsule (250 mcg total) by mouth 2 (two) times daily. 60 capsule 6  . furosemide (LASIX) 20 MG tablet Take 1 tablet (20 mg total) daily as needed by mouth. (Patient taking differently: Take 20 mg by mouth daily. ) 90 tablet 3  . glucose blood test strip Use to test blood glucose level 2 times a day. 100 each 12  . magnesium oxide (MAG-OX) 400 MG tablet Take 1 tablet (400 mg total) by mouth 4 (four) times daily. (Patient taking differently: Take 800 mg by mouth 2 (two) times daily. ) 360 tablet 3  . metFORMIN (GLUCOPHAGE) 1000 MG tablet TAKE ONE TABLET BY MOUTH TWICE DAILY WITH A MEAL 180 tablet 3  . nitroGLYCERIN (NITROSTAT) 0.4 MG SL tablet Place 1 tablet (0.4 mg total) under the tongue every 5 (five) minutes as needed for chest pain (x 3 doses). 30 tablet 3  . pantoprazole (PROTONIX) 40 MG tablet Take 1 tablet (40  mg total) by mouth daily. 90 tablet 3  . terazosin (HYTRIN) 1 MG capsule Take 1 mg by mouth at bedtime. (2100) 30 capsule 11  . triamcinolone cream (KENALOG) 0.1 % Apply 1 application topically 2 (two) times daily. (Patient taking differently: Apply 1 application topically 2 (two) times daily as needed (for knee). ) 30 g  1  . vitamin C (ASCORBIC ACID) 500 MG tablet Take 500 mg by mouth daily.      No current facility-administered medications for this visit.      REVIEW OF SYSTEMS: See HPI for pertinent positives and negatives.  Physical Examination Vitals:   11/05/17 0853 11/05/17 0857  BP: (!) 153/81 (!) 157/74  Pulse: 77   Resp: 18   Temp: (!) 97.1 F (36.2 C)   TempSrc: Oral   SpO2: 99%   Weight: 217 lb 3.2 oz (98.5 kg)    Body mass index is 27.89 kg/m.  General:  WDWN male in NAD Gait: Normal HENT: WNL Eyes: PERRLA Pulmonary: normal non-labored breathing, good air movement in all fields, CTAB, without rales, rhonchi, or wheezing Cardiac:  Irregular vs regular rhythm with frequent missed contractions, no murmur detected  Abdomen: soft, NT, no masses palpated Skin: no rashes, no ulcers, no cellulitis.   VASCULAR EXAM  Carotid Bruits Right Left   Negative Positive      Radial pulses are 2+ palpable bilaterally   Adominal aortic pulse is not palpable                      VASCULAR EXAM: Extremities without ischemic changes, without Gangrene; without open wounds.                                                                                                          LE Pulses Right Left       FEMORAL  3+ palpable  3+ palpable        POPLITEAL  2+ palpable   2+ palpable       POSTERIOR TIBIAL  3+ palpable   3+ palpable        DORSALIS PEDIS      ANTERIOR TIBIAL 2+ palpable  not palpable     Musculoskeletal: no muscle wasting or atrophy; no peripheral edema   Neurologic:  A&O X 3; appropriate affect, sensation is normal; speech is normal, CN 2-12 intact, pain and light touch intact in extremities, motor exam as listed above.     ASSESSMENT:  Thomas Marshall is a 76 y.o. male who is s/p R CEA on 12/10/15. We are also monitoring a small AAA which remains small at 3 cm.  He has no history of stroke or TIA.     DATA  Carotid Duplex (11/05/17): Right ICA: CEA site with  <40% stenosis Left ICA: 40-59% (higher end of range) stenosis  Left ECA stenosis >50%. Bilateral vertebral artery flow is antegrade.  Bilateral subclavian artery waveforms are normal.  No significant change sine exam on 10-30-16.   AAA Duplex (11/05/17): Infrarenal dilatation measuring 3.0 cm;  Right ICA: 1.5 cm; Left ICA: 1.3 cm Previous (01-24-16): 3.0 cm No change since 01-24-16.    PLAN:   Based on today's exam and non-invasive vascular lab results, the patient will follow up in 1 year with the following tests: carotid duplex; 2 years for AAA duplex. I discussed in depth with the patient the nature of atherosclerosis, and emphasized the importance of maximal medical management including strict control of blood pressure, blood glucose, and lipid levels, obtaining regular exercise, and cessation of smoking.  The patient is aware that without maximal medical management the underlying atherosclerotic disease process will progress, limiting the benefit of any interventions.  Consideration for repair of AAA would be made when the size is 5.5 cm, growth > 1 cm/yr, and symptomatic status. The patient was given information about AAA including signs, symptoms, treatment,  what symptoms should prompt the patient to seek immediate medical care, and how to minimize the risk of enlargement and rupture of aneurysms.   The patient was given information about stroke prevention and what symptoms should prompt the patient to seek immediate medical care.  Thank you for allowing [place] to participate in this patient's care.  Clemon Chambers, RN, MSN, FNP-C [facility] Office: [phone]  Clinic MD: Bridgett Larsson 11/05/2017 9:29 AM

## 2017-11-09 ENCOUNTER — Encounter: Payer: Self-pay | Admitting: Family

## 2017-11-15 ENCOUNTER — Telehealth: Payer: Self-pay | Admitting: Internal Medicine

## 2017-11-15 NOTE — Telephone Encounter (Signed)
New Message  Patient c/o Palpitations:  High priority if patient c/o lightheadedness, shortness of breath, or chest pain  1) How long have you had palpitations/irregular HR/ Afib? Are you having the symptoms now?   2) Are you currently experiencing lightheadedness, SOB or CP? no  3) Do you have a history of afib (atrial fibrillation) or irregular heart rhythm?   4) Have you checked your BP or HR? (document readings if available): no   5) Are you experiencing any other symptoms? No  Pt states he felt  his heart skip beats on yesterday. Please call back to discuss

## 2017-11-15 NOTE — Telephone Encounter (Signed)
Returned call to Pt.  Per Pt he noticed yesterday that his heart was skipping beats.  Pt states he can't feel the skips, he notices when he takes his pulse.  Pt wanted to make [name] aware.  Thanked Pt, Pt with f/u with [name] on 11/24/2017 for s/p PVC ablation.  Notified Pt to call if any further issues.

## 2017-11-17 ENCOUNTER — Telehealth: Payer: Self-pay

## 2017-11-17 DIAGNOSIS — I493 Ventricular premature depolarization: Secondary | ICD-10-CM

## 2017-11-17 MED ORDER — PROPRANOLOL HCL 20 MG PO TABS: ORAL_TABLET | ORAL | 0 refills | Status: DC

## 2017-11-17 NOTE — Telephone Encounter (Signed)
Per Dr. Sherron Ales Pt take propranolol 20 mg one tablet by mouth as needed for irregular heartbeat.  Maximum 2 tabs in 24 hours. Notified Pt of [name] medication order.  Pt asking about cost.  Call placed to [place].  Propranolol cost before insurance $22.00.  Prescription sent to [facility]  Notified Pt.  Pt indicates understanding.  Will see 11/24/2017.

## 2017-11-22 NOTE — Addendum Note (Signed)
Addended by: Lianne Cure A on: 11/22/2017 11:30 AM   Modules accepted: Orders

## 2017-11-24 ENCOUNTER — Encounter: Payer: Self-pay | Admitting: Internal Medicine

## 2017-11-24 ENCOUNTER — Ambulatory Visit (INDEPENDENT_AMBULATORY_CARE_PROVIDER_SITE_OTHER): Payer: Medicare Other | Admitting: Internal Medicine

## 2017-11-24 VITALS — BP 124/66 | HR 55 | Ht 74.0 in | Wt 213.8 lb

## 2017-11-24 DIAGNOSIS — I480 Paroxysmal atrial fibrillation: Secondary | ICD-10-CM

## 2017-11-24 DIAGNOSIS — I493 Ventricular premature depolarization: Secondary | ICD-10-CM | POA: Diagnosis not present

## 2017-11-24 DIAGNOSIS — I209 Angina pectoris, unspecified: Secondary | ICD-10-CM

## 2017-11-24 NOTE — Progress Notes (Signed)
HPI [name] returns today for ongoing evaluation and management of PVC's. He is a pleasant 76 yo man with PVC's who underwent EP study and ablation several weeks ago. The ablation markedly reduced the amount of PVC's he had but the could not be eliminated. In the interim, he has been better and feels well. He has been prescribed propranolol to take as a pill in the pocket for break through PVC's.   Allergies  Allergen Reactions  . Lisinopril Swelling and Other (See Comments)    Angioedema.   . Losartan Swelling and Other (See Comments)    Lips swell Angioedema   . Nifedipine Swelling and Other (See Comments)    Sugar increase  . Triamterene Swelling  . Hydralazine Hcl Other (See Comments)    Fatigue; poor appetite  . Amoxicillin Rash and Other (See Comments)    Has patient had a PCN reaction causing immediate rash, facial/tongue/throat swelling, SOB or lightheadedness with hypotension:No Has patient had a PCN reaction causing severe rash involving mucus membranes or skin necrosis:No Has patient had a PCN reaction that required hospitalization:No Has patient had a PCN reaction occurring within the last 10 years: No If all of the above answers are "NO", then may proceed with Cephalosporin use.   . Ampicillin Rash and Other (See Comments)    Has patient had a PCN reaction causing immediate rash, facial/tongue/throat swelling, SOB or lightheadedness with hypotension:No Has patient had a PCN reaction causing severe rash involving mucus membranes or skin necrosis:No Has patient had a PCN reaction that required hospitalization:No Has patient had a PCN reaction occurring within the last 10 years: No If all of the above answers are "NO", then may proceed with Cephalosporin use.    . Carvedilol Other (See Comments)    Low heart rate      Current Outpatient Medications  Medication Sig Dispense Refill  . amLODipine (NORVASC) 10 MG tablet Take 1 tablet (10 mg total) by mouth  daily. 30 tablet 11  . apixaban (ELIQUIS) 5 MG TABS tablet Take 5 mg by mouth 2 (two) times daily. (0800 & 2000)    . atorvastatin (LIPITOR) 40 MG tablet Take 40 mg by mouth at bedtime.     . cholecalciferol (VITAMIN D) 1000 UNITS tablet Take 1,000 Units by mouth every evening.     . cloNIDine (CATAPRES) 0.1 MG tablet TAKE ONE TABLET BY MOUTH TWICE DAILY (Patient taking differently: TAKE 0.1 mg BY MOUTH TWICE DAILY) 180 tablet 3  . clopidogrel (PLAVIX) 75 MG tablet Take 1 tablet (75 mg total) by mouth daily. 90 tablet 3  . dofetilide (TIKOSYN) 250 MCG capsule Take 1 capsule (250 mcg total) by mouth 2 (two) times daily. 60 capsule 6  . furosemide (LASIX) 20 MG tablet Take 1 tablet (20 mg total) daily as needed by mouth. (Patient taking differently: Take 20 mg by mouth daily. ) 90 tablet 3  . glucose blood test strip Use to test blood glucose level 2 times a day. 100 each 12  . magnesium oxide (MAG-OX) 400 MG tablet Take 1 tablet (400 mg total) by mouth 4 (four) times daily. (Patient taking differently: Take 800 mg by mouth 2 (two) times daily. ) 360 tablet 3  . metFORMIN (GLUCOPHAGE) 1000 MG tablet TAKE ONE TABLET BY MOUTH TWICE DAILY WITH A MEAL 180 tablet 3  . nitroGLYCERIN (NITROSTAT) 0.4 MG SL tablet Place 1 tablet (0.4 mg total) under the tongue every 5 (five) minutes as needed for  chest pain (x 3 doses). 30 tablet 3  . pantoprazole (PROTONIX) 40 MG tablet Take 1 tablet (40 mg total) by mouth daily. 90 tablet 3  . propranolol (INDERAL) 20 MG tablet Take one tablet by mouth PRN for irregular heartbeat.  Maximum 2 tablets in 24 hours. 30 tablet 0  . terazosin (HYTRIN) 1 MG capsule Take 1 mg by mouth at bedtime. (2100) 30 capsule 11  . triamcinolone cream (KENALOG) 0.1 % Apply 1 application topically 2 (two) times daily. (Patient taking differently: Apply 1 application topically 2 (two) times daily as needed (for knee). ) 30 g 1  . vitamin C (ASCORBIC ACID) 500 MG tablet Take 500 mg by mouth daily.       No current facility-administered medications for this visit.      Past Medical History:  Diagnosis Date  . AAA (abdominal aortic aneurysm) ([place])   . Arthritis   . Cancer (HCC)    skin & squamous cell  . Carotid artery disease ([place])    a. s/p R CEA 12/2015.  [name] CKD (chronic kidney disease), stage III ([place])    a. per historical labs.  . Coronary artery disease    a. s/p CABG 2004.  . Diabetes mellitus without complication ([place])   . GERD (gastroesophageal reflux disease)   . Hyperlipidemia   . Hypertension   . Medication intolerance Multiple   . PAF (paroxysmal atrial fibrillation) ([place])   . Pneumonia   . Sinus bradycardia    a. baseline HR 50s-60s, also h/o bradycardia on metoprolol and carvedilol    ROS:   All systems reviewed and negative except as noted in the HPI.   Past Surgical History:  Procedure Laterality Date  . ANKLE SURGERY     right fused  . BACK SURGERY     low back   . CHOLECYSTECTOMY N/A 12/07/2013   Procedure: LAPAROSCOPIC CHOLECYSTECTOMY ;  Surgeon: [name], MD;  Location: [facility];  Service: General;  Laterality: N/A;  . COLONOSCOPY    . CORONARY ARTERY BYPASS GRAFT  2004   [place]  . CORONARY STENT INTERVENTION N/A 05/21/2017   Procedure: Coronary Stent Intervention;  Surgeon: [name], MD;  Location: [facility];  Service: Cardiovascular;  Laterality: N/A;  . ENDARTERECTOMY Right 12/10/2015   Procedure: Right Carotid ENDARTERECTOMY;  Surgeon: Conrad Damascus, MD;  Location: [facility];  Service: Vascular;  Laterality: Right;  . EXPLORATION POST OPERATIVE OPEN HEART    . FINGER SURGERY     skin graft on rt index  . FINGER SURGERY Right    right index finger.  [name] HERNIA REPAIR Right    RIH  . JOINT REPLACEMENT    . KNEE SURGERY     replacement on both knees  . LEFT HEART CATH AND CORS/GRAFTS ANGIOGRAPHY N/A 05/21/2017   Procedure: Left Heart Cath and Cors/Grafts Angiography;  Surgeon: [name], MD;  Location: [facility]  [facility];  Service: Cardiovascular;  Laterality: N/A;  . PROSTATE SURGERY    . PVC ABLATION N/A 10/27/2017   Procedure: PVC ABLATION;  Surgeon: Evans Lance, MD;  Location: [facility];  Service: Cardiovascular;  Laterality: N/A;  . TONSILLECTOMY    . URINARY SURGERY     scar tissue     Family History  Problem Relation Age of Onset  . Stroke Mother   . Heart disease Mother   . Heart disease Father   . Hypertension Father   . Hyperlipidemia Father   .  Cancer Sister        breast  and skin  . Diabetes Sister   . Hypertension Sister   . Diabetes Brother   . Heart disease Brother   . Hyperlipidemia Brother   . Hypertension Brother   . Early death Neg Hx      Social History   Socioeconomic History  . Marital status: Married    Spouse name: Not on file  . Number of children: Not on file  . Years of education: Not on file  . Highest education level: Not on file  Social Needs  . Financial resource strain: Not on file  . Food insecurity - worry: Not on file  . Food insecurity - inability: Not on file  . Transportation needs - medical: Not on file  . Transportation needs - non-medical: Not on file  Occupational History  . Not on file  Tobacco Use  . Smoking status: Former Smoker    Packs/day: 1.00    Years: 13.00    Pack years: 13.00    Last attempt to quit: 11/30/1970    Years since quitting: 47.0  . Smokeless tobacco: Never Used  Substance and Sexual Activity  . Alcohol use: No    Alcohol/week: 0.0 oz  . Drug use: No  . Sexual activity: Yes  Other Topics Concern  . Not on file  Social History Narrative  . Not on file     BP 124/66   Pulse (!) 55   Ht 6\' 2"  (1.88 m)   Wt 213 lb 12.8 oz (97 kg)   SpO2 99%   BMI 27.45 kg/m   Physical Exam:  Well appearing 76 yo man, NAD HEENT: Unremarkable Neck:  6 cm JVD, no thyromegally Lymphatics:  No adenopathy Back:  No CVA tenderness Lungs:  Clear with no wheezes HEART:  Regular rate rhythm, no  murmurs, no rubs, no clicks Abd:  soft, positive bowel sounds, no organomegally, no rebound, no guarding Ext:  2 plus pulses, no edema, no cyanosis, no clubbing Skin:  No rashes no nodules Neuro:  CN II through XII intact, motor grossly intact  EKG - NSR with a single PVC  Assess/Plan: 1. PVC's - he is better but still has some PVC's. Our treatment options are somewhat limited. I have asked the patient to wear a 24 hour holter to quantitate his burden of PVC's. 2. PAF - he is maintaining NSR very nicely on dofetilide.  3. CAD - he denies anginal symptoms. Will follow. 4. Carotid and aortic vascular disease - he will continue his statin therapy.  Mikle Bosworth.D.

## 2017-11-24 NOTE — Patient Instructions (Addendum)
Medication Instructions:  Your physician recommends that you continue on your current medications as directed. Please refer to the Current Medication list given to you today.  Labwork: None ordered.  Testing/Procedures: Your physician has recommended that you wear a holter monitor. Holter monitors are medical devices that record the heart's electrical activity. Doctors most often use these monitors to diagnose arrhythmias. Arrhythmias are problems with the speed or rhythm of the heartbeat. The monitor is a small, portable device. You can wear one while you do your normal daily activities. This is usually used to diagnose what is causing palpitations/syncope (passing out).  Please schedule for a 24 hour holter monitor.  Follow-Up: Your physician wants you to follow-up based on results of holter monitor.  Any Other Special Instructions Will Be Listed Below (If Applicable).  If you need a refill on your cardiac medications before your next appointment, please call your pharmacy.

## 2017-12-01 ENCOUNTER — Ambulatory Visit (INDEPENDENT_AMBULATORY_CARE_PROVIDER_SITE_OTHER): Payer: Medicare Other

## 2017-12-01 DIAGNOSIS — I480 Paroxysmal atrial fibrillation: Secondary | ICD-10-CM

## 2017-12-01 DIAGNOSIS — I493 Ventricular premature depolarization: Secondary | ICD-10-CM

## 2017-12-06 ENCOUNTER — Telehealth: Payer: Self-pay

## 2017-12-06 DIAGNOSIS — L821 Other seborrheic keratosis: Secondary | ICD-10-CM | POA: Diagnosis not present

## 2017-12-06 DIAGNOSIS — L570 Actinic keratosis: Secondary | ICD-10-CM | POA: Diagnosis not present

## 2017-12-06 DIAGNOSIS — L578 Other skin changes due to chronic exposure to nonionizing radiation: Secondary | ICD-10-CM | POA: Diagnosis not present

## 2017-12-06 NOTE — Telephone Encounter (Signed)
error 

## 2017-12-09 ENCOUNTER — Ambulatory Visit (INDEPENDENT_AMBULATORY_CARE_PROVIDER_SITE_OTHER): Payer: Medicare Other | Admitting: Family Medicine

## 2017-12-09 ENCOUNTER — Encounter: Payer: Self-pay | Admitting: Family Medicine

## 2017-12-09 DIAGNOSIS — R05 Cough: Secondary | ICD-10-CM | POA: Diagnosis not present

## 2017-12-09 DIAGNOSIS — R059 Cough, unspecified: Secondary | ICD-10-CM | POA: Insufficient documentation

## 2017-12-09 NOTE — Progress Notes (Signed)
Thomas Marshall - 77 y.o. male MRN [id number]  Date of birth: 1941-01-07  SUBJECTIVE:  Including CC & ROS.  Chief Complaint  Patient presents with  . Cough    Thomas Marshall is a 77 y.o. male that is  presenting with a cough. It has been ongoing for a couple of days. He has been taking mucinex and Robitussin with no improvement. Denies fevers, chills or body aches. He has been producing sputum.  Admits to some shortness of breath. Worse at night. Feels like his cough is worse at night. His wife has had similar symptoms. No blood with production. No history of asthma or COPD.    Review of Systems  Constitutional: Negative for fever.  HENT: Positive for rhinorrhea.   Respiratory: Positive for choking. Negative for shortness of breath.   Cardiovascular: Negative for chest pain.  Gastrointestinal: Negative for abdominal pain.  Musculoskeletal: Negative for gait problem.    HISTORY: Past Medical, Surgical, Social, and Family History Reviewed & Updated per EMR.   Pertinent Historical Findings include:  Past Medical History:  Diagnosis Date  . AAA (abdominal aortic aneurysm) ([place])   . Arthritis   . Cancer (HCC)    skin & squamous cell  . Carotid artery disease ([place])    a. s/p R CEA 12/2015.  [name] CKD (chronic kidney disease), stage III ([place])    a. per historical labs.  . Coronary artery disease    a. s/p CABG 2004.  . Diabetes mellitus without complication ([place])   . GERD (gastroesophageal reflux disease)   . Hyperlipidemia   . Hypertension   . Medication intolerance Multiple   . PAF (paroxysmal atrial fibrillation) ([place])   . Pneumonia   . Sinus bradycardia    a. baseline HR 50s-60s, also h/o bradycardia on metoprolol and carvedilol    Past Surgical History:  Procedure Laterality Date  . ANKLE SURGERY     right fused  . BACK SURGERY     low back   . CHOLECYSTECTOMY N/A 12/07/2013   Procedure: LAPAROSCOPIC CHOLECYSTECTOMY ;  Surgeon: [name], MD;  Location: [facility];  Service:  General;  Laterality: N/A;  . COLONOSCOPY    . CORONARY ARTERY BYPASS GRAFT  2004   [place]  . CORONARY STENT INTERVENTION N/A 05/21/2017   Procedure: Coronary Stent Intervention;  Surgeon: [name], MD;  Location: [facility];  Service: Cardiovascular;  Laterality: N/A;  . ENDARTERECTOMY Right 12/10/2015   Procedure: Right Carotid ENDARTERECTOMY;  Surgeon: Conrad [place], MD;  Location: [facility];  Service: Vascular;  Laterality: Right;  . EXPLORATION POST OPERATIVE OPEN HEART    . FINGER SURGERY     skin graft on rt index  . FINGER SURGERY Right    right index finger.  [name] HERNIA REPAIR Right    [facility]  . JOINT REPLACEMENT    . KNEE SURGERY     replacement on both knees  . LEFT HEART CATH AND CORS/GRAFTS ANGIOGRAPHY N/A 05/21/2017   Procedure: Left Heart Cath and Cors/Grafts Angiography;  Surgeon: [name], MD;  Location: [facility];  Service: Cardiovascular;  Laterality: N/A;  . PROSTATE SURGERY    . PVC ABLATION N/A 10/27/2017   Procedure: PVC ABLATION;  Surgeon: Evans Lance, MD;  Location: [facility];  Service: Cardiovascular;  Laterality: N/A;  . TONSILLECTOMY    . URINARY SURGERY     scar tissue    Allergies  Allergen Reactions  . Lisinopril Swelling  and Other (See Comments)    Angioedema.   . Losartan Swelling and Other (See Comments)    Lips swell Angioedema   . Nifedipine Swelling and Other (See Comments)    Sugar increase  . Triamterene Swelling  . Hydralazine Hcl Other (See Comments)    Fatigue; poor appetite  . Amoxicillin Rash and Other (See Comments)    Has patient had a PCN reaction causing immediate rash, facial/tongue/throat swelling, SOB or lightheadedness with hypotension:No Has patient had a PCN reaction causing severe rash involving mucus membranes or skin necrosis:No Has patient had a PCN reaction that required hospitalization:No Has patient had a PCN reaction occurring within the last 10 years: No If all of the above  answers are "NO", then may proceed with Cephalosporin use.   . Ampicillin Rash and Other (See Comments)    Has patient had a PCN reaction causing immediate rash, facial/tongue/throat swelling, SOB or lightheadedness with hypotension:No Has patient had a PCN reaction causing severe rash involving mucus membranes or skin necrosis:No Has patient had a PCN reaction that required hospitalization:No Has patient had a PCN reaction occurring within the last 10 years: No If all of the above answers are "NO", then may proceed with Cephalosporin use.    . Carvedilol Other (See Comments)    Low heart rate     Family History  Problem Relation Age of Onset  . Stroke Mother   . Heart disease Mother   . Heart disease Father   . Hypertension Father   . Hyperlipidemia Father   . Cancer Sister        breast  and skin  . Diabetes Sister   . Hypertension Sister   . Diabetes Brother   . Heart disease Brother   . Hyperlipidemia Brother   . Hypertension Brother   . Early death Neg Hx      Social History   Socioeconomic History  . Marital status: Married    Spouse name: Not on file  . Number of children: Not on file  . Years of education: Not on file  . Highest education level: Not on file  Social Needs  . Financial resource strain: Not on file  . Food insecurity - worry: Not on file  . Food insecurity - inability: Not on file  . Transportation needs - medical: Not on file  . Transportation needs - non-medical: Not on file  Occupational History  . Not on file  Tobacco Use  . Smoking status: Former Smoker    Packs/day: 1.00    Years: 13.00    Pack years: 13.00    Last attempt to quit: 11/30/1970    Years since quitting: 47.0  . Smokeless tobacco: Never Used  Substance and Sexual Activity  . Alcohol use: No    Alcohol/week: 0.0 oz  . Drug use: No  . Sexual activity: Yes  Other Topics Concern  . Not on file  Social History Narrative  . Not on file     PHYSICAL EXAM:  VS: BP  136/68 (BP Location: Left Arm, Patient Position: Sitting, Cuff Size: Normal)   Pulse 68   Temp 97.6 F (36.4 C) (Oral)   Ht 6\' 2"  (1.88 m)   Wt 205 lb (93 kg)   SpO2 95%   BMI 26.32 kg/m  Physical Exam Gen: NAD, alert, cooperative with exam,  ENT: normal lips, normal nasal mucosa, tympanic membranes clear and intact bilaterally, normal oropharynx, no cervical lymphadenopathy Eye: normal EOM, normal conjunctiva and lids  CV:  no edema, +2 pedal pulses, regular rate and rhythm, S1-S2   Resp: no accessory muscle use, non-labored, clear to auscultation bilaterally, no crackles or wheezes Skin: no rashes, no areas of induration  Neuro: normal tone, normal sensation to touch Psych:  normal insight, alert and oriented MSK: Normal gait, normal strength       ASSESSMENT & PLAN:   Cough Likely viral in nature. Wife with similar symptoms. No suggestion of heart failure  - counseled on supportive care  - f/u PRN

## 2017-12-09 NOTE — Assessment & Plan Note (Addendum)
Likely viral in nature. Wife with similar symptoms. No suggestion of heart failure  - counseled on supportive care  - f/u PRN

## 2017-12-09 NOTE — Patient Instructions (Signed)
Delsym can help with cough  Vick's can help with cough.  Honey can help with a sore throat.

## 2017-12-10 ENCOUNTER — Telehealth: Payer: Self-pay

## 2017-12-10 NOTE — Telephone Encounter (Signed)
Detailed message left per DPR:  Notified Pt of new test results from holter monitor.  Pt PVC burden greatly reduced.  Pt out of "danger zone" related to PVC burden.  Per [name] Pt should follow up with him in 6 months.  Notified to call office if any further questions.  Will put in 6 month recall.

## 2017-12-28 ENCOUNTER — Encounter: Payer: Self-pay | Admitting: Podiatry

## 2017-12-28 ENCOUNTER — Ambulatory Visit (INDEPENDENT_AMBULATORY_CARE_PROVIDER_SITE_OTHER): Payer: Medicare Other | Admitting: Podiatry

## 2017-12-28 ENCOUNTER — Ambulatory Visit: Payer: Medicare Other | Admitting: Orthotics

## 2017-12-28 DIAGNOSIS — M79674 Pain in right toe(s): Secondary | ICD-10-CM

## 2017-12-28 DIAGNOSIS — M79675 Pain in left toe(s): Secondary | ICD-10-CM

## 2017-12-28 DIAGNOSIS — Q828 Other specified congenital malformations of skin: Secondary | ICD-10-CM | POA: Diagnosis not present

## 2017-12-28 DIAGNOSIS — E1140 Type 2 diabetes mellitus with diabetic neuropathy, unspecified: Secondary | ICD-10-CM

## 2017-12-28 DIAGNOSIS — B351 Tinea unguium: Secondary | ICD-10-CM

## 2017-12-28 DIAGNOSIS — L84 Corns and callosities: Secondary | ICD-10-CM

## 2017-12-28 NOTE — Progress Notes (Signed)
Patient evaluated/measured/cast for DM2 shoes.  Patient measured 12.5 E on [place] device and chose [id number] shoe;

## 2017-12-29 NOTE — Progress Notes (Signed)
Patient ID: Thomas Marshall, male   DOB: 16-Dec-1940, 77 y.o.   MRN: [id number]  Subjective: 77 y.o. returns the office today for painful, elongated, thickened toenails which he cannot trim himself.  No redness or swelling.He also has a callus on the right foot.  Denies any acute changes since last appointment and no new complaints today. Denies any systemic complaints such as fevers, chills, nausea, vomiting.   Objective: AAO 3, NAD DP/PT pulses palpable, CRT less than 3 seconds Protective sensation decreased with Simms Weinstein monofilament Nails hypertrophic, dystrophic, elongated, brittle, discolored 10. There is tenderness overlying the nails 1-5 bilaterally. There is no surrounding erythema or drainage along the nail sites. Small hyperkeratotic lesion right fifth metatarsal base. No underlying ulceration, drainage or any signs of infection. No significant hyperkeratotic lesion right fifth metatarsal base.  No other areas of tenderness bilateral lower extremities. No overlying edema, erythema, increased warmth. No pain with calf compression, swelling, warmth, erythema.  Assessment: Patient presents with symptomatic onychomycosis; hyperkeratotic tissue  Plan: -Treatment options including alternatives, risks, complications were discussed -Nails sharply debrided 10 without complication/bleeding. -hyperkeratotic lesion sharply debrided 1 without any complications or bleeding. -He was seen by rick for molding of diabetic shoes/inserts.  -Discussed daily foot inspection. If there are any changes, to call the office immediately.  -Follow-up in 3 months or sooner if any problems are to arise. In the meantime, encouraged to call the office with any questions, concerns, changes symptoms.  Celesta Gentile, DPM

## 2018-01-10 ENCOUNTER — Encounter: Payer: Self-pay | Admitting: Family

## 2018-01-10 ENCOUNTER — Ambulatory Visit (INDEPENDENT_AMBULATORY_CARE_PROVIDER_SITE_OTHER): Payer: Medicare Other | Admitting: Family

## 2018-01-10 VITALS — BP 122/78 | HR 68 | Temp 97.9°F | Ht 74.0 in | Wt 215.1 lb

## 2018-01-10 DIAGNOSIS — J209 Acute bronchitis, unspecified: Secondary | ICD-10-CM | POA: Diagnosis not present

## 2018-01-10 DIAGNOSIS — J019 Acute sinusitis, unspecified: Secondary | ICD-10-CM

## 2018-01-10 MED ORDER — PROMETHAZINE-DM 6.25-15 MG/5ML PO SYRP: 5.0000 mL | ORAL_SOLUTION | Freq: Four times a day (QID) | ORAL | 0 refills | Status: DC | PRN

## 2018-01-10 MED ORDER — DOXYCYCLINE HYCLATE 100 MG PO TABS: 100.0000 mg | ORAL_TABLET | Freq: Two times a day (BID) | ORAL | 0 refills | Status: DC

## 2018-01-10 NOTE — Progress Notes (Signed)
Thomas Marshall is a 77 y.o. male with the following history as recorded in EpicCare:  Patient Active Problem List   Diagnosis Date Noted  . Cough 12/09/2017  . PVCs (premature ventricular contractions) 10/27/2017  . PVC's (premature ventricular contractions) 09/05/2017  . Bradycardia 09/04/2017  . Cellulitis of right knee 08/11/2017  . Degenerative disc disease, lumbar 07/26/2017  . Atypical angina ([place])   . Right low back pain 05/18/2017  . Rash 04/08/2017  . Paroxysmal atrial fibrillation with RVR ([place]) 09/27/2016  . Persistent atrial fibrillation ([place])   . Erectile dysfunction 09/16/2016  . Hypomagnesemia 05/22/2016  . Essential hypertension 04/21/2016  . CAD (coronary artery disease) 03/02/2016  . Neck abscess 01/10/2016  . Carotid artery disease without cerebral infarction ([place]) 11/15/2015  . Right facial swelling 08/06/2015  . Benign prostatic hyperplasia 08/06/2015  . Abscess of left thigh 07/16/2015  . Cellulitis and abscess of leg 07/09/2015  . CKD (chronic kidney disease) stage 3, GFR 30-59 ml/min (HCC) 05/14/2015  . Carotid stenosis 05/01/2015  . PVD (peripheral vascular disease) ([place]) 04/09/2015  . AAA (abdominal aortic aneurysm) ([place]) 04/09/2015  . S/P CABG x 4 2004 09/10/2014  . Long term current use of anticoagulant therapy 09/04/2013  . Hx of squamous cell carcinoma of skin   . Type 2 diabetes mellitus with renal manifestations ([place])   . GERD (gastroesophageal reflux disease)   . Hyperlipidemia     Current Outpatient Medications  Medication Sig Dispense Refill  . amLODipine (NORVASC) 10 MG tablet Take 1 tablet (10 mg total) by mouth daily. 30 tablet 11  . apixaban (ELIQUIS) 5 MG TABS tablet Take 5 mg by mouth 2 (two) times daily. (0800 & 2000)    . atorvastatin (LIPITOR) 40 MG tablet Take 40 mg by mouth at bedtime.     . cholecalciferol (VITAMIN D) 1000 UNITS tablet Take 1,000 Units by mouth every evening.     . cloNIDine (CATAPRES) 0.1 MG tablet TAKE ONE  TABLET BY MOUTH TWICE DAILY (Patient taking differently: TAKE 0.1 mg BY MOUTH TWICE DAILY) 180 tablet 3  . clopidogrel (PLAVIX) 75 MG tablet Take 1 tablet (75 mg total) by mouth daily. 90 tablet 3  . dofetilide (TIKOSYN) 250 MCG capsule Take 1 capsule (250 mcg total) by mouth 2 (two) times daily. 60 capsule 6  . furosemide (LASIX) 20 MG tablet Take 1 tablet (20 mg total) daily as needed by mouth. (Patient taking differently: Take 20 mg by mouth daily. ) 90 tablet 3  . glucose blood test strip Use to test blood glucose level 2 times a day. 100 each 12  . magnesium oxide (MAG-OX) 400 MG tablet Take 1 tablet (400 mg total) by mouth 4 (four) times daily. (Patient taking differently: Take 800 mg by mouth 2 (two) times daily. ) 360 tablet 3  . metFORMIN (GLUCOPHAGE) 1000 MG tablet TAKE ONE TABLET BY MOUTH TWICE DAILY WITH A MEAL 180 tablet 3  . nitroGLYCERIN (NITROSTAT) 0.4 MG SL tablet Place 1 tablet (0.4 mg total) under the tongue every 5 (five) minutes as needed for chest pain (x 3 doses). 30 tablet 3  . pantoprazole (PROTONIX) 40 MG tablet Take 1 tablet (40 mg total) by mouth daily. 90 tablet 3  . propranolol (INDERAL) 20 MG tablet Take one tablet by mouth PRN for irregular heartbeat.  Maximum 2 tablets in 24 hours. 30 tablet 0  . terazosin (HYTRIN) 1 MG capsule Take 1 mg by mouth at bedtime. (2100) 30 capsule 11  .  triamcinolone cream (KENALOG) 0.1 % Apply 1 application topically 2 (two) times daily. (Patient taking differently: Apply 1 application topically 2 (two) times daily as needed (for knee). ) 30 g 1  . vitamin C (ASCORBIC ACID) 500 MG tablet Take 500 mg by mouth daily.     [name] doxycycline (VIBRA-TABS) 100 MG tablet Take 1 tablet (100 mg total) by mouth 2 (two) times daily. 14 tablet 0  . promethazine-dextromethorphan (PROMETHAZINE-DM) 6.25-15 MG/5ML syrup Take 5 mLs by mouth 4 (four) times daily as needed for cough. 118 mL 0   No current facility-administered medications for this visit.      Allergies: Lisinopril; Losartan; Nifedipine; Triamterene; Hydralazine hcl; Amoxicillin; Ampicillin; and Carvedilol  Past Medical History:  Diagnosis Date  . AAA (abdominal aortic aneurysm) ([place])   . Arthritis   . Cancer (HCC)    skin & squamous cell  . Carotid artery disease ([place])    a. s/p R CEA 12/2015.  [name] CKD (chronic kidney disease), stage III ([place])    a. per historical labs.  . Coronary artery disease    a. s/p CABG 2004.  . Diabetes mellitus without complication ([place])   . GERD (gastroesophageal reflux disease)   . Hyperlipidemia   . Hypertension   . Medication intolerance Multiple   . PAF (paroxysmal atrial fibrillation) ([place])   . Pneumonia   . Sinus bradycardia    a. baseline HR 50s-60s, also h/o bradycardia on metoprolol and carvedilol    Past Surgical History:  Procedure Laterality Date  . ANKLE SURGERY     right fused  . BACK SURGERY     low back   . CHOLECYSTECTOMY N/A 12/07/2013   Procedure: LAPAROSCOPIC CHOLECYSTECTOMY ;  Surgeon: [name], MD;  Location: [facility];  Service: General;  Laterality: N/A;  . COLONOSCOPY    . CORONARY ARTERY BYPASS GRAFT  2004   [place]  . CORONARY STENT INTERVENTION N/A 05/21/2017   Procedure: Coronary Stent Intervention;  Surgeon: [name], MD;  Location: [facility];  Service: Cardiovascular;  Laterality: N/A;  . ENDARTERECTOMY Right 12/10/2015   Procedure: Right Carotid ENDARTERECTOMY;  Surgeon: Conrad Luray, MD;  Location: [facility];  Service: Vascular;  Laterality: Right;  . EXPLORATION POST OPERATIVE OPEN HEART    . FINGER SURGERY     skin graft on rt index  . FINGER SURGERY Right    right index finger.  [name] HERNIA REPAIR Right    [facility]  . JOINT REPLACEMENT    . KNEE SURGERY     replacement on both knees  . LEFT HEART CATH AND CORS/GRAFTS ANGIOGRAPHY N/A 05/21/2017   Procedure: Left Heart Cath and Cors/Grafts Angiography;  Surgeon: [name], MD;  Location: [facility];  Service: Cardiovascular;   Laterality: N/A;  . PROSTATE SURGERY    . PVC ABLATION N/A 10/27/2017   Procedure: PVC ABLATION;  Surgeon: Evans Lance, MD;  Location: [facility];  Service: Cardiovascular;  Laterality: N/A;  . TONSILLECTOMY    . URINARY SURGERY     scar tissue    Family History  Problem Relation Age of Onset  . Stroke Mother   . Heart disease Mother   . Heart disease Father   . Hypertension Father   . Hyperlipidemia Father   . Cancer Sister        breast  and skin  . Diabetes Sister   . Hypertension Sister   . Diabetes Brother   . Heart  disease Brother   . Hyperlipidemia Brother   . Hypertension Brother   . Early death Neg Hx     Social History   Tobacco Use  . Smoking status: Former Smoker    Packs/day: 1.00    Years: 13.00    Pack years: 13.00    Last attempt to quit: 11/30/1970    Years since quitting: 47.1  . Smokeless tobacco: Never Used  Substance Use Topics  . Alcohol use: No    Alcohol/week: 0.0 oz    Subjective:  Patient presents with persisting cough/ congestion; seen in mid-January with thought for viral infection; patient feels that symptoms have persisted; notes that cough is very bad at night; using OTC Robitussin DM and Tessalon Perles with no relief;   Objective:  Vitals:   01/10/18 1640  BP: 122/78  Pulse: 68  Temp: 97.9 F (36.6 C)  TempSrc: Oral  SpO2: 98%  Weight: 215 lb 1.3 oz (97.6 kg)  Height: 6\' 2"  (1.88 m)    General: Well developed, well nourished, in no acute distress  Skin : Warm and dry.  Head: Normocephalic and atraumatic  Eyes: Sclera and conjunctiva clear; pupils round and reactive to light; extraocular movements intact  Ears: External normal; canals clear; tympanic membranes normal  Oropharynx: Pink, supple. No suspicious lesions  Neck: Supple without thyromegaly, adenopathy  Lungs: Respirations unlabored; clear to auscultation bilaterally without wheeze, rales, rhonchi  CVS exam: normal rate and regular rhythm.  Neurologic:  Alert and oriented; speech intact; face symmetrical; moves all extremities well; CNII-XII intact without focal deficit   Assessment:  1. Acute sinusitis, recurrence not specified, unspecified location   2. Acute bronchitis, unspecified organism     Plan:  Rx for Doxycycline 100 mg bid x 10 days; Rx for Promethazine- DM cough syrup; increase fluids, rest and follow-up worse, no better; (Med interactions were checked with his Tikosyn and Eliquis and Plavix);   No Follow-up on file.  No orders of the defined types were placed in this encounter.   Requested Prescriptions   Signed Prescriptions Disp Refills  . promethazine-dextromethorphan (PROMETHAZINE-DM) 6.25-15 MG/5ML syrup 118 mL 0    Sig: Take 5 mLs by mouth 4 (four) times daily as needed for cough.  . doxycycline (VIBRA-TABS) 100 MG tablet 14 tablet 0    Sig: Take 1 tablet (100 mg total) by mouth 2 (two) times daily.

## 2018-01-11 ENCOUNTER — Encounter: Payer: Self-pay | Admitting: Cardiology

## 2018-01-25 ENCOUNTER — Telehealth: Payer: Self-pay | Admitting: Podiatry

## 2018-01-25 NOTE — Telephone Encounter (Signed)
Spoke to patient about seeing Dr Jenny Reichmann within 6 months per medicare compliance guidelines for the doctor to sign off on diabetic shoes/inserts. His last visit was 6.2018 and pt stated he is scheduled in may to see him. I told him in order to get shoes sooner he would need to call and get appt moved to sooner with Dr Ronnald Ramp and to let me know and I will fax paperwork over.

## 2018-01-31 NOTE — Progress Notes (Addendum)
Established Carotid and AAA Patient   History of Present Illness   Thomas Marshall is a 77 y.o. (1941-11-28) male who presents with chief complaint: no problems.    Prior procedures include:  1. R CEA (12/10/15) for asx R ICA stenosis >80%  Previous carotid studies demonstrated: RICA >82% stenosis, LICA 95-62% stenosis.  Patient has no history of TIA or stroke symptom.  The patient has never had amaurosis fugax or monocular blindness.  The patient has never had facial drooping or hemiplegia.  The patient has never had receptive or expressive aphasia.  The patient has had no CVA or TIA sx.    Patient also has a small AAA.  He is unclear why he was scheduled for today.  He has no back or abd pain.  He denies active smoking.   The patient's PMH, PSH, SH, and FamHx are unchanged from 10/30/16.  Current Outpatient Medications  Medication Sig Dispense Refill  . amLODipine (NORVASC) 10 MG tablet Take 1 tablet (10 mg total) by mouth daily. 30 tablet 11  . apixaban (ELIQUIS) 5 MG TABS tablet Take 5 mg by mouth 2 (two) times daily. (0800 & 2000)    . atorvastatin (LIPITOR) 40 MG tablet Take 40 mg by mouth at bedtime.     . cholecalciferol (VITAMIN D) 1000 UNITS tablet Take 1,000 Units by mouth every evening.     . cloNIDine (CATAPRES) 0.1 MG tablet TAKE ONE TABLET BY MOUTH TWICE DAILY (Patient taking differently: TAKE 0.1 mg BY MOUTH TWICE DAILY) 180 tablet 3  . clopidogrel (PLAVIX) 75 MG tablet Take 1 tablet (75 mg total) by mouth daily. 90 tablet 3  . dofetilide (TIKOSYN) 250 MCG capsule Take 1 capsule (250 mcg total) by mouth 2 (two) times daily. 60 capsule 6  . doxycycline (VIBRA-TABS) 100 MG tablet Take 1 tablet (100 mg total) by mouth 2 (two) times daily. 14 tablet 0  . furosemide (LASIX) 20 MG tablet Take 1 tablet (20 mg total) daily as needed by mouth. (Patient taking differently: Take 20 mg by mouth daily. ) 90 tablet 3  . glucose blood test strip Use to test blood glucose level 2 times a  day. 100 each 12  . magnesium oxide (MAG-OX) 400 MG tablet Take 1 tablet (400 mg total) by mouth 4 (four) times daily. (Patient taking differently: Take 800 mg by mouth 2 (two) times daily. ) 360 tablet 3  . metFORMIN (GLUCOPHAGE) 1000 MG tablet TAKE ONE TABLET BY MOUTH TWICE DAILY WITH A MEAL 180 tablet 3  . nitroGLYCERIN (NITROSTAT) 0.4 MG SL tablet Place 1 tablet (0.4 mg total) under the tongue every 5 (five) minutes as needed for chest pain (x 3 doses). 30 tablet 3  . pantoprazole (PROTONIX) 40 MG tablet Take 1 tablet (40 mg total) by mouth daily. 90 tablet 3  . promethazine-dextromethorphan (PROMETHAZINE-DM) 6.25-15 MG/5ML syrup Take 5 mLs by mouth 4 (four) times daily as needed for cough. 118 mL 0  . propranolol (INDERAL) 20 MG tablet Take one tablet by mouth PRN for irregular heartbeat.  Maximum 2 tablets in 24 hours. 30 tablet 0  . terazosin (HYTRIN) 1 MG capsule Take 1 mg by mouth at bedtime. (2100) 30 capsule 11  . triamcinolone cream (KENALOG) 0.1 % Apply 1 application topically 2 (two) times daily. (Patient taking differently: Apply 1 application topically 2 (two) times daily as needed (for knee). ) 30 g 1  . vitamin C (ASCORBIC ACID) 500 MG tablet Take 500  mg by mouth daily.      No current facility-administered medications for this visit.     On ROS today: no back or abd pain, no CVA or TIA   Physical Examination   Vitals:   02/02/18 0922  BP: 130/65  Pulse: (!) 49  Resp: 18  Temp: (!) 96.9 F (36.1 C)  TempSrc: Oral  SpO2: 97%  Weight: 212 lb (96.2 kg)  Height: 6\' 2"  (1.88 m)   Body mass index is 27.22 kg/m.  General Alert, O x 3, WD, NAD  Neck Supple, mid-line trachea,    Pulmonary Sym exp, good B air movt, CTA B  Cardiac RRR, Nl S1, S2, no Murmurs, No rubs, No S3,S4  Vascular Vessel Right Left  Radial Palpable Palpable  Brachial Palpable Palpable  Carotid Palpable, No Bruit Palpable, No Bruit  Aorta Not palpable N/A  Femoral Palpable Palpable  Popliteal  Not palpable Not palpable  PT Not palpable Not palpable  DP Palpable Palpable    Gastro- intestinal soft, non-distended, non-tender to palpation, No guarding or rebound, no HSM, no masses, no CVAT B, No palpable prominent aortic pulse,    Musculo- skeletal M/S 5/5 throughout  , Extremities without ischemic changes    Neurologic Cranial nerves 2-12 intact , Pain and light touch intact in extremities , Motor exam as listed above    Non-Invasive Vascular Imaging   AAA Duplex (02/02/2018):   Abdominal Aorta Findings: Location AP (cm) Trans (cm) PSV (cm/s) Waveform Thrombus Comments  Proximal 2.7 2.4 75 biphasic     Mid 2.8 2.3 53 biphasic     Distal 3.0 3.2 87 biphasic     RT CIA Prox 1.5 1.6 99 biphasic     RT CIA Mid 1.2 1.1 103 biphasic     Prior exam 11/05/2017 distal aorta 3.0 x 2.9.   Final Interpretation: Abdominal Aorta: The largest aortic diameter remains essentially unchanged compared to prior exam. Previous diameter measurement was 3.0 cm.    Medical Decision Making   Thomas Marshall is a 77 y.o. male who presents with: s/p R CEA for asx R ICA stenosis >80%., asx L ICA stenosis 40-59%, small cm AAA.   Patient already has follow up with my NP in 9 months for B carotid duplex for surveillance.  I would skip the AAA duplex at the next appointment and repeat his AAA duplex q2 years, scheduled for the same day of the B carotid duplex.  I discussed in depth with the patient the nature of atherosclerosis, and emphasized the importance of maximal medical management including strict control of blood pressure, blood glucose, and lipid levels, antiplatelet agents, obtaining regular exercise, and cessation of smoking.    The patient is aware that without maximal medical management the underlying atherosclerotic disease process will progress, limiting the benefit of any interventions. The patient is currently on a statin: Lipitor.  The patient is currently on an anti-platelet: Plavix.    Patient is also on Elquis.  Thank you for allowing [place] to participate in this patient's care.   Adele Barthel, MD, FACS [facility] Office: [phone] Pager: [phone]

## 2018-02-02 ENCOUNTER — Ambulatory Visit (HOSPITAL_COMMUNITY)
Admission: RE | Admit: 2018-02-02 | Discharge: 2018-02-02 | Disposition: A | Discharge status: Home / Self Care | Payer: Medicare Other | Source: Ambulatory Visit | Attending: Vascular Surgery | Admitting: Vascular Surgery

## 2018-02-02 ENCOUNTER — Ambulatory Visit (INDEPENDENT_AMBULATORY_CARE_PROVIDER_SITE_OTHER): Payer: Medicare Other | Admitting: Vascular Surgery

## 2018-02-02 ENCOUNTER — Encounter: Payer: Self-pay | Admitting: Vascular Surgery

## 2018-02-02 VITALS — BP 130/65 | HR 49 | Temp 96.9°F | Resp 18 | Ht 74.0 in | Wt 212.0 lb

## 2018-02-02 DIAGNOSIS — I714 Abdominal aortic aneurysm, without rupture, unspecified: Secondary | ICD-10-CM

## 2018-02-02 DIAGNOSIS — I739 Peripheral vascular disease, unspecified: Secondary | ICD-10-CM

## 2018-02-02 DIAGNOSIS — I779 Disorder of arteries and arterioles, unspecified: Secondary | ICD-10-CM | POA: Diagnosis not present

## 2018-02-04 ENCOUNTER — Ambulatory Visit: Payer: Medicare Other | Admitting: Vascular Surgery

## 2018-02-04 ENCOUNTER — Other Ambulatory Visit (HOSPITAL_COMMUNITY): Payer: Medicare Other

## 2018-02-14 ENCOUNTER — Ambulatory Visit: Payer: Medicare Other | Admitting: Orthotics

## 2018-02-14 DIAGNOSIS — E1140 Type 2 diabetes mellitus with diabetic neuropathy, unspecified: Secondary | ICD-10-CM

## 2018-02-14 DIAGNOSIS — E1142 Type 2 diabetes mellitus with diabetic polyneuropathy: Secondary | ICD-10-CM

## 2018-02-14 DIAGNOSIS — L84 Corns and callosities: Secondary | ICD-10-CM

## 2018-02-14 DIAGNOSIS — Q828 Other specified congenital malformations of skin: Secondary | ICD-10-CM

## 2018-02-14 NOTE — Progress Notes (Signed)
Reordered NB [id number] velcro 12.5 W   Patient not happy with [employer]

## 2018-02-21 ENCOUNTER — Ambulatory Visit: Payer: Medicare Other | Admitting: Orthotics

## 2018-02-21 DIAGNOSIS — M79675 Pain in left toe(s): Principal | ICD-10-CM

## 2018-02-21 DIAGNOSIS — E1140 Type 2 diabetes mellitus with diabetic neuropathy, unspecified: Secondary | ICD-10-CM

## 2018-02-21 DIAGNOSIS — M79674 Pain in right toe(s): Secondary | ICD-10-CM

## 2018-02-21 NOTE — Progress Notes (Signed)
Shoes ordered wrong...needs a lace up 12.5  Patient chose NB [id number]

## 2018-02-25 ENCOUNTER — Ambulatory Visit (INDEPENDENT_AMBULATORY_CARE_PROVIDER_SITE_OTHER): Payer: Medicare Other | Admitting: Cardiovascular Disease

## 2018-02-25 ENCOUNTER — Encounter: Payer: Self-pay | Admitting: Cardiovascular Disease

## 2018-02-25 VITALS — BP 138/60 | HR 45 | Ht 74.0 in | Wt 215.8 lb

## 2018-02-25 DIAGNOSIS — R531 Weakness: Secondary | ICD-10-CM

## 2018-02-25 DIAGNOSIS — R002 Palpitations: Secondary | ICD-10-CM

## 2018-02-25 DIAGNOSIS — R5383 Other fatigue: Secondary | ICD-10-CM | POA: Diagnosis not present

## 2018-02-25 NOTE — Patient Instructions (Signed)
Medication Instructions:  Your provider recommends that you continue on your current medications as directed. Please refer to the Current Medication list given to you today.    Labwork: None  Testing/Procedures: Your physician has recommended that you wear a holter monitor. Holter monitors are medical devices that record the heart's electrical activity. Doctors most often use these monitors to diagnose arrhythmias. Arrhythmias are problems with the speed or rhythm of the heartbeat. The monitor is a small, portable device. You can wear one while you do your normal daily activities. This is usually used to diagnose what is causing palpitations/syncope (passing out).  Follow-Up: Your provider recommends that you schedule a follow-up appointment with [name] after your monitor is complete.  Any Other Special Instructions Will Be Listed Below (If Applicable).     If you need a refill on your cardiac medications before your next appointment, please call your pharmacy.

## 2018-02-25 NOTE — Progress Notes (Signed)
Cardiology Office Note Date:  02/27/2018   ID:  Thomas, Marshall 1941-05-30, MRN [id number]  PCP:  [name], MD  Cardiologist:  [name], MD    Chief Complaint  Patient presents with  . Fatigue  . Palpitations     History of Present Illness: Thomas Marshall is a 77 y.o. male who presents for follow-up of CAD, symptomatic PVC's, paroxysmal atrial fibrillation.   The patient is here with his wife today.  He continues to have a lot of problems with heart palpitations, progressive fatigue, and episodic lightheadedness.  He underwent PVC ablation last year with marked reduction in number of PVCs assessed by Holter monitoring in January.  However, his symptoms have worsened again.  He is taking Inderal on an as-needed basis and is taking this 2 or 3 times in the last week.  He has had some episodes of diaphoresis and weakness.  He has not had frank syncope.  He denies recent chest pain or pressure.  He has shortness of breath with moderate activity and this is unchanged.  He denies leg swelling, orthopnea, or PND.  He did have an episode of severe headache associated with markedly elevated blood pressure recently.  States that his blood pressure was approximately 200/130 mmHg.  It came back down after he took his medicines and rested.  He has had no recurrence of headaches since then.   Past Medical History:  Diagnosis Date  . AAA (abdominal aortic aneurysm) ([place])   . Arthritis   . Cancer (HCC)    skin & squamous cell  . Carotid artery disease ([place])    a. s/p R CEA 12/2015.  [name] CKD (chronic kidney disease), stage III ([place])    a. per historical labs.  . Coronary artery disease    a. s/p CABG 2004.  . Diabetes mellitus without complication ([place])   . GERD (gastroesophageal reflux disease)   . Hyperlipidemia   . Hypertension   . Medication intolerance Multiple   . PAF (paroxysmal atrial fibrillation) ([place])   . Pneumonia   . Sinus bradycardia    a. baseline HR 50s-60s, also h/o  bradycardia on metoprolol and carvedilol    Past Surgical History:  Procedure Laterality Date  . ANKLE SURGERY     right fused  . BACK SURGERY     low back   . CHOLECYSTECTOMY N/A 12/07/2013   Procedure: LAPAROSCOPIC CHOLECYSTECTOMY ;  Surgeon: [name], MD;  Location: [facility];  Service: General;  Laterality: N/A;  . COLONOSCOPY    . CORONARY ARTERY BYPASS GRAFT  2004   [place]  . CORONARY STENT INTERVENTION N/A 05/21/2017   Procedure: Coronary Stent Intervention;  Surgeon: [name], MD;  Location: [facility];  Service: Cardiovascular;  Laterality: N/A;  . ENDARTERECTOMY Right 12/10/2015   Procedure: Right Carotid ENDARTERECTOMY;  Surgeon: Conrad Farmington, MD;  Location: [facility];  Service: Vascular;  Laterality: Right;  . EXPLORATION POST OPERATIVE OPEN HEART    . FINGER SURGERY     skin graft on rt index  . FINGER SURGERY Right    right index finger.  [name] HERNIA REPAIR Right    [facility]  . JOINT REPLACEMENT    . KNEE SURGERY     replacement on both knees  . LEFT HEART CATH AND CORS/GRAFTS ANGIOGRAPHY N/A 05/21/2017   Procedure: Left Heart Cath and Cors/Grafts Angiography;  Surgeon: [name], MD;  Location: [facility];  Service: Cardiovascular;  Laterality: N/A;  . PROSTATE SURGERY    . PVC ABLATION N/A 10/27/2017   Procedure: PVC ABLATION;  Surgeon: Evans Lance, MD;  Location: [facility];  Service: Cardiovascular;  Laterality: N/A;  . TONSILLECTOMY    . URINARY SURGERY     scar tissue    Current Outpatient Medications  Medication Sig Dispense Refill  . amLODipine (NORVASC) 10 MG tablet Take 1 tablet (10 mg total) by mouth daily. 30 tablet 11  . apixaban (ELIQUIS) 5 MG TABS tablet Take 5 mg by mouth 2 (two) times daily. (0800 & 2000)    . atorvastatin (LIPITOR) 40 MG tablet Take 40 mg by mouth at bedtime.     . cholecalciferol (VITAMIN D) 1000 UNITS tablet Take 1,000 Units by mouth every evening.     . cloNIDine (CATAPRES) 0.1 MG tablet  TAKE ONE TABLET BY MOUTH TWICE DAILY (Patient taking differently: TAKE 0.1 mg BY MOUTH TWICE DAILY) 180 tablet 3  . clopidogrel (PLAVIX) 75 MG tablet Take 1 tablet (75 mg total) by mouth daily. 90 tablet 3  . dofetilide (TIKOSYN) 250 MCG capsule Take 1 capsule (250 mcg total) by mouth 2 (two) times daily. 60 capsule 6  . doxycycline (VIBRA-TABS) 100 MG tablet Take 1 tablet (100 mg total) by mouth 2 (two) times daily. 14 tablet 0  . glucose blood test strip Use to test blood glucose level 2 times a day. 100 each 12  . magnesium oxide (MAG-OX) 400 MG tablet Take 1 tablet (400 mg total) by mouth 4 (four) times daily. (Patient taking differently: Take 800 mg by mouth 2 (two) times daily. ) 360 tablet 3  . metFORMIN (GLUCOPHAGE) 1000 MG tablet TAKE ONE TABLET BY MOUTH TWICE DAILY WITH A MEAL 180 tablet 3  . nitroGLYCERIN (NITROSTAT) 0.4 MG SL tablet Place 1 tablet (0.4 mg total) under the tongue every 5 (five) minutes as needed for chest pain (x 3 doses). 30 tablet 3  . pantoprazole (PROTONIX) 40 MG tablet Take 1 tablet (40 mg total) by mouth daily. 90 tablet 3  . promethazine-dextromethorphan (PROMETHAZINE-DM) 6.25-15 MG/5ML syrup Take 5 mLs by mouth 4 (four) times daily as needed for cough. 118 mL 0  . propranolol (INDERAL) 20 MG tablet Take one tablet by mouth PRN for irregular heartbeat.  Maximum 2 tablets in 24 hours. 30 tablet 0  . terazosin (HYTRIN) 1 MG capsule Take 1 mg by mouth at bedtime. (2100) 30 capsule 11  . triamcinolone cream (KENALOG) 0.1 % Apply 1 application topically 2 (two) times daily. (Patient taking differently: Apply 1 application topically 2 (two) times daily as needed (for knee). ) 30 g 1  . vitamin C (ASCORBIC ACID) 500 MG tablet Take 500 mg by mouth daily.     . furosemide (LASIX) 20 MG tablet Take 1 tablet (20 mg total) daily as needed by mouth. (Patient taking differently: Take 20 mg by mouth daily. ) 90 tablet 3   No current facility-administered medications for this  visit.     Allergies:   Lisinopril; Losartan; Nifedipine; Triamterene; Hydralazine hcl; Amoxicillin; Ampicillin; and Carvedilol   Social History:  The patient  reports that he quit smoking about 47 years ago. He has a 13.00 pack-year smoking history. He has never used smokeless tobacco. He reports that he does not drink alcohol or use drugs.   Family History:  The patient's family history includes Cancer in his sister; Diabetes in his brother and sister; Heart disease in his brother, father,  and mother; Hyperlipidemia in his brother and father; Hypertension in his brother, father, and sister; Stroke in his mother.    ROS:  Please see the history of present illness.  Otherwise, review of systems is positive for excessive sweating.  All other systems are reviewed and negative.    PHYSICAL EXAM: VS:  BP 138/60   Pulse (!) 45   Ht _0  (1.88 m)   Wt 215 lb 12.8 oz (97.9 kg)   SpO2 98%   BMI 27.71 kg/m  , BMI Body mass index is 27.71 kg/m. GEN: Well nourished, well developed, in no acute distress  HEENT: normal  Neck: no JVD, no masses. BL carotid bruits Cardiac: bradycardic and regular with a 2/6 SEM at the RUSB       Respiratory:  clear to auscultation bilaterally, normal work of breathing GI: soft, nontender, nondistended, + BS MS: no deformity or atrophy  Ext: no pretibial edema, pedal pulses 2+= bilaterally Skin: warm and dry, no rash Neuro:  Strength and sensation are intact Psych: euthymic mood, full affect  EKG:  EKG is not ordered today.  Recent Labs: 03/16/2017: TSH 3.47 05/17/2017: NT-Pro BNP 277 09/04/2017: ALT 14 09/05/2017: Magnesium 2.3 10/14/2017: BUN 26; Creatinine, Ser 1.69; Hemoglobin 11.1; Platelets 141; Potassium 4.9; Sodium 148   Lipid Panel     Component Value Date/Time   CHOL 107 03/16/2017 0953   TRIG 68.0 03/16/2017 0953   HDL 37.70 (L) 03/16/2017 0953   CHOLHDL 3 03/16/2017 0953   VLDL 13.6 03/16/2017 0953   LDLCALC 56 03/16/2017 0953   LDLDIRECT  67.0 09/16/2016 1455      Wt Readings from Last 3 Encounters:  02/25/18 215 lb 12.8 oz (97.9 kg)  02/02/18 212 lb (96.2 kg)  01/10/18 215 lb 1.3 oz (97.6 kg)     Cardiac Studies Reviewed: Echo 09-29-2017: Study Conclusions  - Left ventricle: The cavity size was normal. There was moderate   focal basal hypertrophy of the septum with otherwise moderate   concentric hypertrophy. Systolic function was normal. The   estimated ejection fraction was in the range of 55% to 60%. Wall   motion was normal; there were no regional wall motion   abnormalities. The study is not technically sufficient to allow   evaluation of LV diastolic function. - Aortic valve: Trileaflet; severely thickened, severely calcified   leaflets. The non-coronary cusp is most affected. Noncoronary   cusp mobility was severely restricted. Valve area (VTI): 2.39   cm^2. Valve area (Vmax): 2.36 cm^2. Valve area (Vmean): 1.85   cm^2. - Mitral valve: Moderately calcified annulus. Transvalvular   velocity was within the normal range. There was no evidence for   stenosis. There was mild regurgitation. - Left atrium: The atrium was moderately dilated. - Right ventricle: The cavity size was normal. Wall thickness was   normal. Systolic function was normal. - Atrial septum: No defect or patent foramen ovale was identified   by color flow Doppler. - Tricuspid valve: There was trivial regurgitation. - Pulmonary arteries: Systolic pressure was mildly increased. PA   peak pressure: 41 mm Hg (S).  ------------------------------------------------------------------- Labs, prior tests, procedures, and surgery: Coronary artery bypass grafting.  ------------------------------------------------------------------- Study data:  Comparison was made to the study of 05/27/2016.  Study status:  Routine.  Procedure:  The patient reported no pain pre or post test. Transthoracic echocardiography. Image quality was adequate.  Study  completion:  There were no complications. Transthoracic echocardiography.  M-mode, complete 2D, spectral Doppler, and color  Doppler.  Birthdate:  Patient birthdate: 16-Mar-1941.  Age:  Patient is 77 yr old.  Sex:  Gender: male. BMI: 28.5 kg/m^2.  Blood pressure:     134/62  Patient status: Outpatient.  Study date:  Study date: 09/29/2017. Study time: 09:26 AM.  Location:  [facility]  -------------------------------------------------------------------  ------------------------------------------------------------------- Left ventricle:  The cavity size was normal. There was moderate focal basal hypertrophy of the septum with otherwise moderate concentric hypertrophy. Systolic function was normal. The estimated ejection fraction was in the range of 55% to 60%. Wall motion was normal; there were no regional wall motion abnormalities. The study is not technically sufficient to allow evaluation of LV diastolic function.  ------------------------------------------------------------------- Aortic valve:  Trileaflet; severely thickened, severely calcified leaflets. The non-coronary cusp is most affected.  Noncoronary cusp mobility was severely restricted.  Doppler:  Transvalvular velocity was within the normal range. There was no stenosis. There was no regurgitation.    VTI ratio of LVOT to aortic valve: 0.69. Valve area (VTI): 2.39 cm^2. Indexed valve area (VTI): 1.03 cm^2/m^2. Peak velocity ratio of LVOT to aortic valve: 0.68. Valve area (Vmax): 2.36 cm^2. Indexed valve area (Vmax): 1.02 cm^2/m^2. Mean velocity ratio of LVOT to aortic valve: 0.53. Valve area (Vmean): 1.85 cm^2. Indexed valve area (Vmean): 0.8 cm^2/m^2.    Mean gradient (S): 7 mm Hg. Peak gradient (S): 12 mm Hg.  ------------------------------------------------------------------- Aorta:  Aortic root: The aortic root was normal in size.  ------------------------------------------------------------------- Mitral  valve:   Moderately calcified annulus. Mobility was not restricted.  Doppler:  Transvalvular velocity was within the normal range. There was no evidence for stenosis. There was mild regurgitation.    Peak gradient (D): 5 mm Hg.  ------------------------------------------------------------------- Left atrium:  The atrium was moderately dilated.  ------------------------------------------------------------------- Atrial septum:  No defect or patent foramen ovale was identified by color flow Doppler.  ------------------------------------------------------------------- Right ventricle:  The cavity size was normal. Wall thickness was normal. Systolic function was normal.  ------------------------------------------------------------------- Pulmonic valve:    Structurally normal valve.   Cusp separation was normal.  Doppler:  Transvalvular velocity was within the normal range. There was no evidence for stenosis. There was no regurgitation.  ------------------------------------------------------------------- Tricuspid valve:   Structurally normal valve.    Doppler: Transvalvular velocity was within the normal range. There was trivial regurgitation.  ------------------------------------------------------------------- Pulmonary artery:   The main pulmonary artery was normal-sized. Systolic pressure was mildly increased.  ------------------------------------------------------------------- Right atrium:  The atrium was normal in size.  ------------------------------------------------------------------- Pericardium:  There was no pericardial effusion.  ------------------------------------------------------------------- Systemic veins: Inferior vena cava: The vessel was normal in size. The respirophasic diameter changes were in the normal range (>= 50%), consistent with normal central venous pressure.  Cardiac Cath 05-21-2017: Conclusion   1. Severe native coronary artery  disease with total occlusion of the left main and total occlusion of the RCA 2. Status post aortocoronary bypass surgery with continued patency of the LIMA to LAD and RIMA to RCA, patency of the saphenous vein graft to OM1 with a moderate eccentric lesion just distal to the graft insertion site, and patency of the saphenous vein graft to ramus intermedius with a critical lesion in the proximal body the graft treated successfully with PCI using distal embolic protection and a 4.0 mm drug-eluting stent  Recommendations: The patient will be hydrated for 6 hours. He will be discharged this evening with follow-up labs next week. Recommend aspirin, Plavix, and Eliquis 1 month, then discontinue aspirin. He should start back  on Eliquis tomorrow evening.  Indications   Atypical angina (HCC) [I20.8 (ICD-10-CM)]  Procedural Details/Technique   Technical Details INDICATION: 77 yo male with hx of CABG, presenting with progressive fatigue and exercise intolerance, typical of his previous cardiac symptoms. He has never had typical chest pain, even preceding CABG. However, he feels symptoms are similar to those he experienced prior to CABG.  PROCEDURAL DETAILS:  The right groin was prepped, draped, and anesthetized with 1% lidocaine. Ultrasound guidance was used for access. Using modified Seldinger technique, a 5 French sheath was introduced into the right femoral artery. Standard Judkins catheters were used for coronary angiography, SVG angiography, RIMA and LIMA angiography. The RIMA was imaged non-selectively because of marked subclavian tortuosity. LV pressure is recorded with a pigtail catheter. PCI is performed after the diagnostic procedure. Catheter exchanges were performed over an 0.035 guidewire. A Perclose devices used for femoral hemostasis. There were no immediate procedural complications. The patient was transferred to the post catheterization recovery area for further monitoring.    Estimated blood  loss <50 mL.  During this procedure the patient was administered the following to achieve and maintain moderate conscious sedation: Versed mg, Fentanyl mcg, while the patient's heart rate, blood pressure, and oxygen saturation were continuously monitored.  Coronary Findings   Diagnostic  Dominance: Right  Left Main  Ost LM to LM lesion 100% stenosed  The left main is critically stenosed with minimal flow beyond the area of severe stenosis.  Right Coronary Artery  Mid RCA to Dist RCA lesion 100% stenosed  Mid RCA to Dist RCA lesion.  RIMA RIMA Graft to RPDA  RIMA graft was visualized by non-selective angiography. The RIMA to PDA graft is patent. The right subclavian artery is very tortuous and will not allow for a catheter to be advanced to the area of the [place]. The vessel was imaged nonselectively and is demonstrated to be patent. The distal anastomosis is poorly visualized but there is TIMI 3 flow.  saphenous Graft to Ramus  SVG. The SVG to ramus intermedius has a critical degenerated 95% eccentric lesion with TIMI 2 flow beyond the lesion. The lesion is located in the proximal body of the graft.  Prox Graft lesion 95% stenosed  The lesion is eccentric. An AL-1 guide catheter is used. Angiomax was used for coagulation. The right femoral sheath was upsized to a 6 [place]. A therapeutic ACT is achieved. The lesion is crossed with cougar wire. Verapamil was administered through the guide once the bypass graft is selectively engaged. The lesion is predilated with a 2.5 mm balloon. Verapamil was administered again. A 5 mm spider distal embolic protection device was prepped and advanced beyond the lesion. The lesion is then stented with a 4.0 x 16 mm Promus DES deployed at 14 atm. The spider embolic protection device is removed and final angiography confirmed 0% residual stenosis and TIMI-3 flow.  saphenous Graft to 1st Mrg  SVG. The SVG to OM is patent with diffuse irregularity. The OM branch where  the vessel is tented up has an unusual appearance just beyond the distal anastomosis. There is at least 50-70% stenosis in that region.  LIMA LIMA Graft to Mid LAD  LIMA. The LIMA to mid LAD is widely patent with no stenosis. The LAD beyond the LIMA insertion site is patent.  Intervention   Prox Graft lesion (saphenous Graft to Ramus)  Angioplasty  Lesion crossed with guidewire. Pre-stent angioplasty was performed. A STENT PROMUS PREM MR [id number] drug eluting stent  was successfully placed. Post-stent angioplasty was not performed. The pre-interventional distal flow is decreased (TIMI 2). The post-interventional distal flow is normal (TIMI 3). No complications occurred at this lesion.  There is no residual stenosis post intervention.  Coronary Diagrams   Diagnostic Diagram       Post-Intervention Diagram        Recent labs are reviewed from February 10, 2018: LFTs are normal.  Creatinine is 1.56 which is stable for him.  Potassium is 4.1.  Cholesterol is 118, triglycerides 100, HDL 45, LDL 53.  ASSESSMENT AND PLAN: 1.  CAD, native vessel, with angina: anginal symptoms controlled on current Rx. He states his current symptoms are not like those he experienced last year prior to saphenous vein graft PCI. He continues on clopidogrel without ASA in the setting of chronic oral anticoagulation.  2. Symptomatic PVC's: recurrent symptoms, frequent palpitations and fatigue. Pt markedly bradycardic today. Repeat Holter, unable to use a beta-blocker with his bradycardia. Refer back to Dr Lovena Le.   3. HTN: labile BP has been difficult to control. Doing much better on low dose clonidine, amlodipine. Would like to get him off of clonidine with bradycardia, but multiple allergies/intolerances (beta-blockers/ACE/ARB) limit treatment options.   4. Hyperlipidemia: treated with a high-intensity statin drug (atorvastatin 40 mg)  5. Paroxysmal atrial fibrillation: on Tikosyn, anticoagulated with  apixaban  Current medicines are reviewed with the patient today.  The patient does not have concerns regarding medicines.  Labs/ tests ordered today include:   Orders Placed This Encounter  Procedures  . HOLTER MONITOR - 48 HOUR    Disposition:   FU as above  Signed, [name], MD  02/27/2018 10:57 AM    [facility] [address] [place], [place], [place]  [address] Phone: [phone]; Fax: [phone]

## 2018-02-27 ENCOUNTER — Encounter: Payer: Self-pay | Admitting: Cardiovascular Disease

## 2018-03-03 ENCOUNTER — Ambulatory Visit (INDEPENDENT_AMBULATORY_CARE_PROVIDER_SITE_OTHER): Payer: Self-pay | Admitting: Podiatry

## 2018-03-03 DIAGNOSIS — E1140 Type 2 diabetes mellitus with diabetic neuropathy, unspecified: Secondary | ICD-10-CM | POA: Diagnosis not present

## 2018-03-03 DIAGNOSIS — B351 Tinea unguium: Secondary | ICD-10-CM

## 2018-03-03 DIAGNOSIS — L84 Corns and callosities: Secondary | ICD-10-CM | POA: Diagnosis not present

## 2018-03-03 NOTE — Progress Notes (Signed)

## 2018-03-08 ENCOUNTER — Telehealth: Payer: Self-pay | Admitting: Internal Medicine

## 2018-03-08 NOTE — Telephone Encounter (Signed)
Outreach made to Pt.  Call went to VM. Left detailed message. Per [name], no medications that can be discontinued that would help increase heart rate without allowing blood pressure to get to high.  Advised Pt to rise slowly from sitting to standing.  If Pt has syncopal event go to ER.  Pt has holter scheduled for 03/10/2018 with f/u with [name] 03/18/2018. Left this nurse name and # for call back if any questions.

## 2018-03-08 NOTE — Telephone Encounter (Signed)
Pt c/o BP issue: STAT if pt c/o blurred vision, one-sided weakness or slurred speech  1. What are your last 5 BP readings? 122/65   Hr 36 2. Are you having any other symptoms (ex. Dizziness, headache, blurred vision, passed out)? weak   3. What is your BP issue? Pt feels weak

## 2018-03-10 ENCOUNTER — Ambulatory Visit (INDEPENDENT_AMBULATORY_CARE_PROVIDER_SITE_OTHER): Payer: Medicare Other

## 2018-03-10 ENCOUNTER — Other Ambulatory Visit: Payer: Self-pay | Admitting: Cardiovascular Disease

## 2018-03-10 DIAGNOSIS — R002 Palpitations: Secondary | ICD-10-CM

## 2018-03-10 DIAGNOSIS — R001 Bradycardia, unspecified: Secondary | ICD-10-CM | POA: Diagnosis not present

## 2018-03-10 DIAGNOSIS — R5383 Other fatigue: Secondary | ICD-10-CM | POA: Diagnosis not present

## 2018-03-10 DIAGNOSIS — R531 Weakness: Secondary | ICD-10-CM | POA: Diagnosis not present

## 2018-03-10 DIAGNOSIS — R42 Dizziness and giddiness: Secondary | ICD-10-CM

## 2018-03-17 ENCOUNTER — Ambulatory Visit: Payer: Medicare Other | Admitting: Internal Medicine

## 2018-03-18 ENCOUNTER — Encounter: Payer: Self-pay | Admitting: Internal Medicine

## 2018-03-18 ENCOUNTER — Ambulatory Visit (INDEPENDENT_AMBULATORY_CARE_PROVIDER_SITE_OTHER): Payer: Medicare Other | Admitting: Internal Medicine

## 2018-03-18 VITALS — BP 132/62 | HR 62 | Ht 74.0 in | Wt 211.0 lb

## 2018-03-18 DIAGNOSIS — I493 Ventricular premature depolarization: Secondary | ICD-10-CM | POA: Diagnosis not present

## 2018-03-18 DIAGNOSIS — I480 Paroxysmal atrial fibrillation: Secondary | ICD-10-CM

## 2018-03-18 MED ORDER — PROPRANOLOL HCL 20 MG PO TABS: ORAL_TABLET | ORAL | 3 refills | Status: DC

## 2018-03-18 NOTE — Progress Notes (Signed)
HPI Thomas Marshall returns today for followup of his PVC"s. He is a pleasant 77 yo man with a h/o PAF, HTN, and symptomatic PVC"s who underwent ablation about 4 months ago. We markedly reduced his PVC's and repeat 24 hour holter revealed that his PVC burden went from 25% down to 7%. He saw Dr. Burt Knack several weeks ago and he was feeling more tired and fatigued and had his heart monitor repeated demonstrating 17% PVC's. He has days where he feels well but others where he is more fatigued.  Allergies  Allergen Reactions  . Lisinopril Swelling and Other (See Comments)    Angioedema.   . Losartan Swelling and Other (See Comments)    Lips swell Angioedema   . Nifedipine Swelling and Other (See Comments)    Sugar increase  . Triamterene Swelling  . Hydralazine Hcl Other (See Comments)    Fatigue; poor appetite  . Amoxicillin Rash and Other (See Comments)    Has patient had a PCN reaction causing immediate rash, facial/tongue/throat swelling, SOB or lightheadedness with hypotension:No Has patient had a PCN reaction causing severe rash involving mucus membranes or skin necrosis:No Has patient had a PCN reaction that required hospitalization:No Has patient had a PCN reaction occurring within the last 10 years: No If all of the above answers are "NO", then may proceed with Cephalosporin use.   . Ampicillin Rash and Other (See Comments)    Has patient had a PCN reaction causing immediate rash, facial/tongue/throat swelling, SOB or lightheadedness with hypotension:No Has patient had a PCN reaction causing severe rash involving mucus membranes or skin necrosis:No Has patient had a PCN reaction that required hospitalization:No Has patient had a PCN reaction occurring within the last 10 years: No If all of the above answers are "NO", then may proceed with Cephalosporin use.    . Carvedilol Other (See Comments)    Low heart rate      Current Outpatient Medications  Medication Sig Dispense  Refill  . amLODipine (NORVASC) 10 MG tablet Take 1 tablet (10 mg total) by mouth daily. 30 tablet 11  . apixaban (ELIQUIS) 5 MG TABS tablet Take 5 mg by mouth 2 (two) times daily. (0800 & 2000)    . atorvastatin (LIPITOR) 40 MG tablet Take 40 mg by mouth at bedtime.     . cholecalciferol (VITAMIN D) 1000 UNITS tablet Take 1,000 Units by mouth every evening.     . cloNIDine (CATAPRES) 0.1 MG tablet TAKE ONE TABLET BY MOUTH TWICE DAILY (Patient taking differently: TAKE 0.1 mg BY MOUTH TWICE DAILY) 180 tablet 3  . clopidogrel (PLAVIX) 75 MG tablet Take 1 tablet (75 mg total) by mouth daily. 90 tablet 3  . dofetilide (TIKOSYN) 250 MCG capsule Take 1 capsule (250 mcg total) by mouth 2 (two) times daily. 60 capsule 6  . glucose blood test strip Use to test blood glucose level 2 times a day. 100 each 12  . magnesium oxide (MAG-OX) 400 MG tablet Take 1 tablet (400 mg total) by mouth 4 (four) times daily. (Patient taking differently: Take 800 mg by mouth 2 (two) times daily. ) 360 tablet 3  . metFORMIN (GLUCOPHAGE) 1000 MG tablet TAKE ONE TABLET BY MOUTH TWICE DAILY WITH A MEAL 180 tablet 3  . nitroGLYCERIN (NITROSTAT) 0.4 MG SL tablet Place 1 tablet (0.4 mg total) under the tongue every 5 (five) minutes as needed for chest pain (x 3 doses). 30 tablet 3  . pantoprazole (PROTONIX) 40 MG  tablet Take 1 tablet (40 mg total) by mouth daily. 90 tablet 3  . promethazine-dextromethorphan (PROMETHAZINE-DM) 6.25-15 MG/5ML syrup Take 5 mLs by mouth 4 (four) times daily as needed for cough. 118 mL 0  . propranolol (INDERAL) 20 MG tablet Take one tablet by mouth PRN for irregular heartbeat.  Maximum 2 tablets in 24 hours. 30 tablet 0  . terazosin (HYTRIN) 1 MG capsule Take 1 mg by mouth at bedtime. (2100) 30 capsule 11  . triamcinolone cream (KENALOG) 0.1 % Apply 1 application topically 2 (two) times daily. (Patient taking differently: Apply 1 application topically 2 (two) times daily as needed (for knee). ) 30 g 1  .  vitamin C (ASCORBIC ACID) 500 MG tablet Take 500 mg by mouth daily.      No current facility-administered medications for this visit.      Past Medical History:  Diagnosis Date  . AAA (abdominal aortic aneurysm) ([place])   . Arthritis   . Cancer (HCC)    skin & squamous cell  . Carotid artery disease ([place])    a. s/p R CEA 12/2015.  [name] CKD (chronic kidney disease), stage III ([place])    a. per historical labs.  . Coronary artery disease    a. s/p CABG 2004.  . Diabetes mellitus without complication ([place])   . GERD (gastroesophageal reflux disease)   . Hyperlipidemia   . Hypertension   . Medication intolerance Multiple   . PAF (paroxysmal atrial fibrillation) ([place])   . Pneumonia   . Sinus bradycardia    a. baseline HR 50s-60s, also h/o bradycardia on metoprolol and carvedilol    ROS:   All systems reviewed and negative except as noted in the HPI.   Past Surgical History:  Procedure Laterality Date  . ANKLE SURGERY     right fused  . BACK SURGERY     low back   . CHOLECYSTECTOMY N/A 12/07/2013   Procedure: LAPAROSCOPIC CHOLECYSTECTOMY ;  Surgeon: [name], MD;  Location: [facility];  Service: General;  Laterality: N/A;  . COLONOSCOPY    . CORONARY ARTERY BYPASS GRAFT  2004   [place]  . CORONARY STENT INTERVENTION N/A 05/21/2017   Procedure: Coronary Stent Intervention;  Surgeon: [name], MD;  Location: [facility];  Service: Cardiovascular;  Laterality: N/A;  . ENDARTERECTOMY Right 12/10/2015   Procedure: Right Carotid ENDARTERECTOMY;  Surgeon: Conrad [place], MD;  Location: [facility];  Service: Vascular;  Laterality: Right;  . EXPLORATION POST OPERATIVE OPEN HEART    . FINGER SURGERY     skin graft on rt index  . FINGER SURGERY Right    right index finger.  [name] HERNIA REPAIR Right    [facility]  . JOINT REPLACEMENT    . KNEE SURGERY     replacement on both knees  . LEFT HEART CATH AND CORS/GRAFTS ANGIOGRAPHY N/A 05/21/2017   Procedure: Left Heart Cath and  Cors/Grafts Angiography;  Surgeon: [name], MD;  Location: [facility];  Service: Cardiovascular;  Laterality: N/A;  . PROSTATE SURGERY    . PVC ABLATION N/A 10/27/2017   Procedure: PVC ABLATION;  Surgeon: Evans Lance, MD;  Location: [facility];  Service: Cardiovascular;  Laterality: N/A;  . TONSILLECTOMY    . URINARY SURGERY     scar tissue     Family History  Problem Relation Age of Onset  . Stroke Mother   . Heart disease Mother   . Heart disease Father   .  Hypertension Father   . Hyperlipidemia Father   . Cancer Sister        breast  and skin  . Diabetes Sister   . Hypertension Sister   . Diabetes Brother   . Heart disease Brother   . Hyperlipidemia Brother   . Hypertension Brother   . Early death Neg Hx      Social History   Socioeconomic History  . Marital status: Married    Spouse name: Not on file  . Number of children: Not on file  . Years of education: Not on file  . Highest education level: Not on file  Occupational History  . Not on file  Social Needs  . Financial resource strain: Not on file  . Food insecurity:    Worry: Not on file    Inability: Not on file  . Transportation needs:    Medical: Not on file    Non-medical: Not on file  Tobacco Use  . Smoking status: Former Smoker    Packs/day: 1.00    Years: 13.00    Pack years: 13.00    Last attempt to quit: 11/30/1970    Years since quitting: 47.3  . Smokeless tobacco: Never Used  Substance and Sexual Activity  . Alcohol use: No    Alcohol/week: 0.0 oz  . Drug use: No  . Sexual activity: Yes  Lifestyle  . Physical activity:    Days per week: Not on file    Minutes per session: Not on file  . Stress: Not on file  Relationships  . Social connections:    Talks on phone: Not on file    Gets together: Not on file    Attends religious service: Not on file    Active member of club or organization: Not on file    Attends meetings of clubs or organizations: Not on  file    Relationship status: Not on file  . Intimate partner violence:    Fear of current or ex partner: Not on file    Emotionally abused: Not on file    Physically abused: Not on file    Forced sexual activity: Not on file  Other Topics Concern  . Not on file  Social History Narrative  . Not on file     BP 132/62   Pulse 62   Ht 6\' 2"  (1.88 m)   Wt 211 lb (95.7 kg)   BMI 27.09 kg/m   Physical Exam:  Well appearing 77 yo man, NAD HEENT: Unremarkable Neck:  No JVD, no thyromegally Lymphatics:  No adenopathy Back:  No CVA tenderness Lungs:  Clear HEART:  Regular rate rhythm, no murmurs, no rubs, no clicks Abd:  soft, positive bowel sounds, no organomegally, no rebound, no guarding Ext:  2 plus pulses, no edema, no cyanosis, no clubbing Skin:  No rashes no nodules Neuro:  CN II through XII intact, motor grossly intact  EKG - NSR with RBBB  Assess/Plan: 1. PVC's - we discussed the treatment options in detail. We discussed taking additional propranolol vs stopping dofetilide and switching to either sotalol or amio or repeat catheter ablation. He is not inclined to change at this point as he feels pretty well most days.  2. PAF - he appears to be maintaining NSR on dofetilide. 3. CAD - he denies anginal symptoms. He will continue his current meds.   Mikle Bosworth.D.

## 2018-03-18 NOTE — Patient Instructions (Addendum)
Medication Instructions:  Your physician recommends that you continue on your current medications as directed. Please refer to the Current Medication list given to you today.  Take a propranolol as needed for palpitations  Labwork: None ordered.  Testing/Procedures: None ordered.  Follow-Up: Your physician wants you to follow-up in: 4 months with [name].     Any Other Special Instructions Will Be Listed Below (If Applicable).  If you need a refill on your cardiac medications before your next appointment, please call your pharmacy.

## 2018-03-28 ENCOUNTER — Telehealth: Payer: Self-pay | Admitting: Podiatry

## 2018-03-28 ENCOUNTER — Ambulatory Visit (INDEPENDENT_AMBULATORY_CARE_PROVIDER_SITE_OTHER): Payer: Medicare Other | Admitting: Podiatry

## 2018-03-28 DIAGNOSIS — E1140 Type 2 diabetes mellitus with diabetic neuropathy, unspecified: Secondary | ICD-10-CM

## 2018-03-28 DIAGNOSIS — B351 Tinea unguium: Secondary | ICD-10-CM | POA: Diagnosis not present

## 2018-03-28 DIAGNOSIS — M79675 Pain in left toe(s): Secondary | ICD-10-CM | POA: Diagnosis not present

## 2018-03-28 DIAGNOSIS — Q828 Other specified congenital malformations of skin: Secondary | ICD-10-CM

## 2018-03-28 DIAGNOSIS — M79674 Pain in right toe(s): Secondary | ICD-10-CM

## 2018-03-28 NOTE — Progress Notes (Signed)
Patient ID: Thomas Marshall, male   DOB: Nov 08, 1941, 77 y.o.   MRN: [id number]  Subjective: 77 y.o. returns the office today for painful, elongated, thickened toenails which he cannot trim himself.  No redness or swelling. Denies any acute changes since last appointment and no new complaints today. Denies any systemic complaints such as fevers, chills, nausea, vomiting.   Objective: AAO 3, NAD DP/PT pulses palpable, CRT less than 3 seconds Protective sensation decreased with Simms Weinstein monofilament Nails hypertrophic, dystrophic, elongated, brittle, discolored 10. There is tenderness overlying the nails 1-5 bilaterally. There is no surrounding erythema or drainage along the nail sites. There is very minimal hyperkeratotic lesion right fifth metatarsal base. No ulceration, drainage or any signs of infection. No significant hyperkeratotic lesion right fifth metatarsal base.  No other areas of tenderness bilateral lower extremities. No overlying edema, erythema, increased warmth. No pain with calf compression, swelling, warmth, erythema.  Assessment: Patient presents with symptomatic onychomycosis; hyperkeratotic tissue  Plan: -Treatment options including alternatives, risks, complications were discussed -Nails sharply debrided 10 without complication/bleeding. -Very minimal hyperkeratotic tissue right foot- I did not debride this today.  -Discussed daily foot inspection. If there are any changes, to call the office immediately.  -Follow-up in 3 months or sooner if any problems are to arise. In the meantime, encouraged to call the office with any questions, concerns, changes symptoms.  Celesta Gentile, DPM

## 2018-03-28 NOTE — Telephone Encounter (Signed)
Pt transferred to [facility](He wanted to leave message for Dr. Jacqualyn Posey) but it was actually meant for [facility].

## 2018-03-31 ENCOUNTER — Encounter: Payer: Self-pay | Admitting: Internal Medicine

## 2018-03-31 ENCOUNTER — Ambulatory Visit (INDEPENDENT_AMBULATORY_CARE_PROVIDER_SITE_OTHER): Payer: Medicare Other | Admitting: Internal Medicine

## 2018-03-31 ENCOUNTER — Other Ambulatory Visit (INDEPENDENT_AMBULATORY_CARE_PROVIDER_SITE_OTHER): Payer: Medicare Other

## 2018-03-31 VITALS — BP 124/68 | HR 61 | Temp 97.7°F | Ht 74.0 in | Wt 215.0 lb

## 2018-03-31 DIAGNOSIS — I10 Essential (primary) hypertension: Secondary | ICD-10-CM | POA: Diagnosis not present

## 2018-03-31 DIAGNOSIS — E1122 Type 2 diabetes mellitus with diabetic chronic kidney disease: Secondary | ICD-10-CM | POA: Diagnosis not present

## 2018-03-31 DIAGNOSIS — N182 Chronic kidney disease, stage 2 (mild): Secondary | ICD-10-CM

## 2018-03-31 DIAGNOSIS — N183 Chronic kidney disease, stage 3 unspecified: Secondary | ICD-10-CM

## 2018-03-31 DIAGNOSIS — E7800 Pure hypercholesterolemia, unspecified: Secondary | ICD-10-CM

## 2018-03-31 DIAGNOSIS — N320 Bladder-neck obstruction: Secondary | ICD-10-CM | POA: Diagnosis not present

## 2018-03-31 LAB — URINALYSIS, ROUTINE W REFLEX MICROSCOPIC
Bilirubin Urine: NEGATIVE
HGB URINE DIPSTICK: NEGATIVE
Ketones, ur: NEGATIVE
Leukocytes, UA: NEGATIVE
NITRITE: NEGATIVE
PH: 6 (ref 5.0–8.0)
SPECIFIC GRAVITY, URINE: 1.025 (ref 1.000–1.030)
TOTAL PROTEIN, URINE-UPE24: NEGATIVE
Urine Glucose: NEGATIVE
Urobilinogen, UA: 0.2 (ref 0.0–1.0)

## 2018-03-31 LAB — BASIC METABOLIC PANEL
BUN: 27 mg/dL — ABNORMAL HIGH (ref 6–23)
CALCIUM: 9.2 mg/dL (ref 8.4–10.5)
CHLORIDE: 103 meq/L (ref 96–112)
CO2: 30 mEq/L (ref 19–32)
Creatinine, Ser: 1.56 mg/dL — ABNORMAL HIGH (ref 0.40–1.50)
GFR: 45.89 mL/min — ABNORMAL LOW (ref >60.00)
Glucose, Bld: 117 mg/dL — ABNORMAL HIGH (ref 70–99)
Potassium: 5 mEq/L (ref 3.5–5.1)
SODIUM: 141 meq/L (ref 135–145)

## 2018-03-31 LAB — LIPID PANEL
CHOL/HDL RATIO: 3
CHOLESTEROL: 118 mg/dL (ref 0–200)
HDL: 43.4 mg/dL (ref >39.00)
LDL Cholesterol: 58 mg/dL (ref 0–99)
NonHDL: 74.64
TRIGLYCERIDES: 82 mg/dL (ref 0.0–149.0)
VLDL: 16.4 mg/dL (ref 0.0–40.0)

## 2018-03-31 LAB — CBC WITH DIFFERENTIAL/PLATELET
BASOS PCT: 1.5 % (ref 0.0–3.0)
Basophils Absolute: 0.1 10*3/uL (ref 0.0–0.1)
EOS PCT: 10.3 % — AB (ref 0.0–5.0)
Eosinophils Absolute: 0.6 10*3/uL (ref 0.0–0.7)
HEMATOCRIT: 36.3 % — AB (ref 39.0–52.0)
Hemoglobin: 11.9 g/dL — ABNORMAL LOW (ref 13.0–17.0)
Lymphocytes Relative: 25.9 % (ref 12.0–46.0)
Lymphs Abs: 1.5 10*3/uL (ref 0.7–4.0)
MCHC: 32.9 g/dL (ref 30.0–36.0)
MCV: 91.4 fl (ref 78.0–100.0)
MONOS PCT: 10.2 % (ref 3.0–12.0)
Monocytes Absolute: 0.6 10*3/uL (ref 0.1–1.0)
NEUTROS ABS: 3.1 10*3/uL (ref 1.4–7.7)
NEUTROS PCT: 52.1 % (ref 43.0–77.0)
PLATELETS: 129 10*3/uL — AB (ref 150.0–400.0)
RBC: 3.97 Mil/uL — ABNORMAL LOW (ref 4.22–5.81)
RDW: 15.1 % (ref 11.5–15.5)
WBC: 5.9 10*3/uL (ref 4.0–10.5)

## 2018-03-31 LAB — HEPATIC FUNCTION PANEL
ALBUMIN: 4.4 g/dL (ref 3.5–5.2)
ALT: 11 U/L (ref 0–53)
AST: 15 U/L (ref 0–37)
Alkaline Phosphatase: 63 U/L (ref 39–117)
Bilirubin, Direct: 0.2 mg/dL (ref 0.0–0.3)
TOTAL PROTEIN: 6.6 g/dL (ref 6.0–8.3)
Total Bilirubin: 0.7 mg/dL (ref 0.2–1.2)

## 2018-03-31 LAB — HEMOGLOBIN A1C: Hgb A1c MFr Bld: 6.7 % — ABNORMAL HIGH (ref 4.6–6.5)

## 2018-03-31 LAB — MICROALBUMIN / CREATININE URINE RATIO
CREATININE, U: 236 mg/dL
Microalb Creat Ratio: 2.2 mg/g (ref 0.0–30.0)
Microalb, Ur: 5.1 mg/dL — ABNORMAL HIGH (ref 0.0–1.9)

## 2018-03-31 LAB — PSA: PSA: 1.09 ng/mL (ref 0.10–4.00)

## 2018-03-31 LAB — TSH: TSH: 2.5 u[IU]/mL (ref 0.35–4.50)

## 2018-03-31 NOTE — Patient Instructions (Signed)

## 2018-03-31 NOTE — Assessment & Plan Note (Signed)
stable overall by history and exam, recent data reviewed with pt, and pt to continue medical treatment as before,  to f/u any worsening symptoms or concerns Lab Results  Component Value Date   CREATININE 1.69 (H) 10/14/2017  for f//u lab today

## 2018-03-31 NOTE — Assessment & Plan Note (Signed)
stable overall by history and exam, recent data reviewed with pt, and pt to continue medical treatment as before,  to f/u any worsening symptoms or concern Lab Results  Component Value Date   LDLCALC 56 03/16/2017

## 2018-03-31 NOTE — Progress Notes (Signed)
Subjective:    Patient ID: Thomas Marshall, male    DOB: Jul 01, 1941, 77 y.o.   MRN: [id number]  HPI  Here for yearly f/u;  Overall doing ok;  Pt denies Chest pain, worsening SOB, DOE, wheezing, orthopnea, PND, worsening LE edema, palpitations, dizziness or syncope.  Pt denies neurological change such as new headache, facial or extremity weakness.  Pt denies polydipsia, polyuria, or low sugar symptoms. Pt states overall good compliance with treatment and medications, good tolerability, and has been trying to follow appropriate diet.  Pt denies worsening depressive symptoms, suicidal ideation or panic. No fever, night sweats, wt loss, loss of appetite, or other constitutional symptoms.  Pt states good ability with ADL's, has low fall risk, home safety reviewed and adequate, no other significant changes in hearing or vision, and occasionally active with exercise.  No new complaints or interval hx Past Medical History:  Diagnosis Date  . AAA (abdominal aortic aneurysm) ([place])   . Arthritis   . Cancer (HCC)    skin & squamous cell  . Carotid artery disease ([place])    a. s/p R CEA 12/2015.  [name] CKD (chronic kidney disease), stage III ([place])    a. per historical labs.  . Coronary artery disease    a. s/p CABG 2004.  . Diabetes mellitus without complication ([place])   . GERD (gastroesophageal reflux disease)   . Hyperlipidemia   . Hypertension   . Medication intolerance Multiple   . PAF (paroxysmal atrial fibrillation) ([place])   . Pneumonia   . Sinus bradycardia    a. baseline HR 50s-60s, also h/o bradycardia on metoprolol and carvedilol   Past Surgical History:  Procedure Laterality Date  . ANKLE SURGERY     right fused  . BACK SURGERY     low back   . CHOLECYSTECTOMY N/A 12/07/2013   Procedure: LAPAROSCOPIC CHOLECYSTECTOMY ;  Surgeon: [name], MD;  Location: [facility];  Service: General;  Laterality: N/A;  . COLONOSCOPY    . CORONARY ARTERY BYPASS GRAFT  2004   [place]  . CORONARY STENT  INTERVENTION N/A 05/21/2017   Procedure: Coronary Stent Intervention;  Surgeon: [name], MD;  Location: [facility];  Service: Cardiovascular;  Laterality: N/A;  . ENDARTERECTOMY Right 12/10/2015   Procedure: Right Carotid ENDARTERECTOMY;  Surgeon: Conrad [place], MD;  Location: [facility];  Service: Vascular;  Laterality: Right;  . EXPLORATION POST OPERATIVE OPEN HEART    . FINGER SURGERY     skin graft on rt index  . FINGER SURGERY Right    right index finger.  [name] HERNIA REPAIR Right    [facility]  . JOINT REPLACEMENT    . KNEE SURGERY     replacement on both knees  . LEFT HEART CATH AND CORS/GRAFTS ANGIOGRAPHY N/A 05/21/2017   Procedure: Left Heart Cath and Cors/Grafts Angiography;  Surgeon: [name], MD;  Location: [facility];  Service: Cardiovascular;  Laterality: N/A;  . PROSTATE SURGERY    . PVC ABLATION N/A 10/27/2017   Procedure: PVC ABLATION;  Surgeon: Evans Lance, MD;  Location: [facility];  Service: Cardiovascular;  Laterality: N/A;  . TONSILLECTOMY    . URINARY SURGERY     scar tissue    reports that he quit smoking about 47 years ago. He has a 13.00 pack-year smoking history. He has never used smokeless tobacco. He reports that he does not drink alcohol or use drugs. family history includes Cancer in his  sister; Diabetes in his brother and sister; Heart disease in his brother, father, and mother; Hyperlipidemia in his brother and father; Hypertension in his brother, father, and sister; Stroke in his mother. Allergies  Allergen Reactions  . Lisinopril Swelling and Other (See Comments)    Angioedema.   . Losartan Swelling and Other (See Comments)    Lips swell Angioedema   . Nifedipine Swelling and Other (See Comments)    Sugar increase  . Triamterene Swelling  . Hydralazine Hcl Other (See Comments)    Fatigue; poor appetite  . Amoxicillin Rash and Other (See Comments)    Has patient had a PCN reaction causing immediate rash,  facial/tongue/throat swelling, SOB or lightheadedness with hypotension:No Has patient had a PCN reaction causing severe rash involving mucus membranes or skin necrosis:No Has patient had a PCN reaction that required hospitalization:No Has patient had a PCN reaction occurring within the last 10 years: No If all of the above answers are "NO", then may proceed with Cephalosporin use.   . Ampicillin Rash and Other (See Comments)    Has patient had a PCN reaction causing immediate rash, facial/tongue/throat swelling, SOB or lightheadedness with hypotension:No Has patient had a PCN reaction causing severe rash involving mucus membranes or skin necrosis:No Has patient had a PCN reaction that required hospitalization:No Has patient had a PCN reaction occurring within the last 10 years: No If all of the above answers are "NO", then may proceed with Cephalosporin use.    . Carvedilol Other (See Comments)    Low heart rate    Current Outpatient Medications on File Prior to Visit  Medication Sig Dispense Refill  . amLODipine (NORVASC) 10 MG tablet Take 1 tablet (10 mg total) by mouth daily. 30 tablet 11  . apixaban (ELIQUIS) 5 MG TABS tablet Take 5 mg by mouth 2 (two) times daily. (0800 & 2000)    . atorvastatin (LIPITOR) 40 MG tablet Take 40 mg by mouth at bedtime.     . cholecalciferol (VITAMIN D) 1000 UNITS tablet Take 1,000 Units by mouth every evening.     . cloNIDine (CATAPRES) 0.1 MG tablet TAKE ONE TABLET BY MOUTH TWICE DAILY (Patient taking differently: TAKE 0.1 mg BY MOUTH TWICE DAILY) 180 tablet 3  . clopidogrel (PLAVIX) 75 MG tablet Take 1 tablet (75 mg total) by mouth daily. 90 tablet 3  . dofetilide (TIKOSYN) 250 MCG capsule Take 1 capsule (250 mcg total) by mouth 2 (two) times daily. 60 capsule 6  . glucose blood test strip Use to test blood glucose level 2 times a day. 100 each 12  . magnesium oxide (MAG-OX) 400 MG tablet Take 1 tablet (400 mg total) by mouth 4 (four) times daily.  (Patient taking differently: Take 800 mg by mouth 2 (two) times daily. ) 360 tablet 3  . metFORMIN (GLUCOPHAGE) 1000 MG tablet TAKE ONE TABLET BY MOUTH TWICE DAILY WITH A MEAL 180 tablet 3  . nitroGLYCERIN (NITROSTAT) 0.4 MG SL tablet Place 1 tablet (0.4 mg total) under the tongue every 5 (five) minutes as needed for chest pain (x 3 doses). 30 tablet 3  . pantoprazole (PROTONIX) 40 MG tablet Take 1 tablet (40 mg total) by mouth daily. 90 tablet 3  . propranolol (INDERAL) 20 MG tablet Take one tablet by mouth PRN for irregular heartbeat.  Maximum 2 tablets in 24 hours. 30 tablet 3  . terazosin (HYTRIN) 1 MG capsule Take 1 mg by mouth at bedtime. (2100) 30 capsule 11  . triamcinolone  cream (KENALOG) 0.1 % Apply 1 application topically 2 (two) times daily. (Patient taking differently: Apply 1 application topically 2 (two) times daily as needed (for knee). ) 30 g 1  . vitamin C (ASCORBIC ACID) 500 MG tablet Take 500 mg by mouth daily.      No current facility-administered medications on file prior to visit.    Review of Systems Constitutional: Negative for other unusual diaphoresis, sweats, appetite or weight changes HENT: Negative for other worsening hearing loss, ear pain, facial swelling, mouth sores or neck stiffness.   Eyes: Negative for other worsening pain, redness or other visual disturbance.  Respiratory: Negative for other stridor or swelling Cardiovascular: Negative for other palpitations or other chest pain  Gastrointestinal: Negative for worsening diarrhea or loose stools, blood in stool, distention or other pain Genitourinary: Negative for hematuria, flank pain or other change in urine volume.  Musculoskeletal: Negative for myalgias or other joint swelling.  Skin: Negative for other color change, or other wound or worsening drainage.  Neurological: Negative for other syncope or numbness. Hematological: Negative for other adenopathy or swelling Psychiatric/Behavioral: Negative for  hallucinations, other worsening agitation, SI, self-injury, or new decreased concentration All other system neg per pt    Objective:   Physical Exam BP 124/68   Pulse 61   Temp 97.7 F (36.5 C) (Oral)   Ht 6\' 2"  (1.88 m)   Wt 215 lb (97.5 kg)   SpO2 97%   BMI 27.60 kg/m  VS noted,  Constitutional: Pt is oriented to person, place, and time. Appears well-developed and well-nourished, in no significant distress and comfortable Head: Normocephalic and atraumatic  Eyes: Conjunctivae and EOM are normal. Pupils are equal, round, and reactive to light Right Ear: External ear normal without discharge Left Ear: External ear normal without discharge Nose: Nose without discharge or deformity Mouth/Throat: Oropharynx is without other ulcerations and moist  Neck: Normal range of motion. Neck supple. No JVD present. No tracheal deviation present or significant neck LA or mass Cardiovascular: Normal rate, regular rhythm, normal heart sounds and intact distal pulses.   Pulmonary/Chest: WOB normal and breath sounds without rales or wheezing  Abdominal: Soft. Bowel sounds are normal. NT. No HSM  Musculoskeletal: Normal range of motion. Exhibits no edema Lymphadenopathy: Has no other cervical adenopathy.  Neurological: Pt is alert and oriented to person, place, and time. Pt has normal reflexes. No cranial nerve deficit. Motor grossly intact, Gait intact Skin: Skin is warm and dry. No rash noted or new ulcerations Psychiatric:  Has normal mood and affect. Behavior is normal without agitation No other exam findings    Assessment & Plan:

## 2018-03-31 NOTE — Assessment & Plan Note (Signed)
stable overall by history and exam, recent data reviewed with pt, and pt to continue medical treatment as before,  to f/u any worsening symptoms or concerns BP Readings from Last 3 Encounters:  03/31/18 124/68  03/18/18 132/62  02/25/18 138/60

## 2018-03-31 NOTE — Assessment & Plan Note (Signed)
stable overall by history and exam, recent data reviewed with pt, and pt to continue medical treatment as before,  to f/u any worsening symptoms or concerns Lab Results  Component Value Date   HGBA1C 6.1 09/16/2017

## 2018-04-21 DIAGNOSIS — L821 Other seborrheic keratosis: Secondary | ICD-10-CM | POA: Diagnosis not present

## 2018-04-21 DIAGNOSIS — D1801 Hemangioma of skin and subcutaneous tissue: Secondary | ICD-10-CM | POA: Diagnosis not present

## 2018-04-21 DIAGNOSIS — L570 Actinic keratosis: Secondary | ICD-10-CM | POA: Diagnosis not present

## 2018-04-21 DIAGNOSIS — L578 Other skin changes due to chronic exposure to nonionizing radiation: Secondary | ICD-10-CM | POA: Diagnosis not present

## 2018-05-16 ENCOUNTER — Ambulatory Visit: Payer: Medicare Other | Admitting: Cardiovascular Disease

## 2018-06-27 ENCOUNTER — Ambulatory Visit (INDEPENDENT_AMBULATORY_CARE_PROVIDER_SITE_OTHER): Payer: Medicare Other | Admitting: Podiatry

## 2018-06-27 ENCOUNTER — Encounter: Payer: Self-pay | Admitting: Podiatry

## 2018-06-27 DIAGNOSIS — B351 Tinea unguium: Secondary | ICD-10-CM | POA: Diagnosis not present

## 2018-06-27 DIAGNOSIS — M79675 Pain in left toe(s): Secondary | ICD-10-CM

## 2018-06-27 DIAGNOSIS — E1140 Type 2 diabetes mellitus with diabetic neuropathy, unspecified: Secondary | ICD-10-CM

## 2018-06-27 DIAGNOSIS — M79674 Pain in right toe(s): Secondary | ICD-10-CM

## 2018-06-27 DIAGNOSIS — Q828 Other specified congenital malformations of skin: Secondary | ICD-10-CM

## 2018-06-27 NOTE — Progress Notes (Signed)
Patient ID: Thomas Marshall, male   DOB: 02/25/1941, 77 y.o.   MRN: [id number]  Subjective: 77 y.o. returns the office today for painful, elongated, thickened toenails which he cannot trim himself and for calluses on the right foot.  No redness or swelling. Denies any acute changes since last appointment and no new complaints today. Denies any systemic complaints such as fevers, chills, nausea, vomiting.   Objective: AAO 3, NAD DP/PT pulses palpable, CRT less than 3 seconds Protective sensation decreased with Simms Weinstein monofilament Nails hypertrophic, dystrophic, elongated, brittle, discolored 10. There is tenderness overlying the nails 1-5 bilaterally. There is no surrounding erythema or drainage along the nail sites. There is very minimal hyperkeratotic lesion right fifth metatarsal base and fifth metatarsal head. No ulceration, drainage or any signs of infection. No significant hyperkeratotic lesion right fifth metatarsal base.  No other areas of tenderness bilateral lower extremities. No overlying edema, erythema, increased warmth. No pain with calf compression, swelling, warmth, erythema.  Assessment: Patient presents with symptomatic onychomycosis; hyperkeratotic tissue  Plan: -Treatment options including alternatives, risks, complications were discussed -Nails sharply debrided 10 without complication/bleeding. -Hyperkeratotic tissue sharply debrided x 2 without any complications or bleeding.  -Discussed daily foot inspection. If there are any changes, to call the office immediately.  -Follow-up in 3 months or sooner if any problems are to arise. In the meantime, encouraged to call the office with any questions, concerns, changes symptoms.  Celesta Gentile, DPM

## 2018-07-03 ENCOUNTER — Other Ambulatory Visit: Payer: Self-pay | Admitting: Cardiovascular Disease

## 2018-07-11 ENCOUNTER — Telehealth: Payer: Self-pay | Admitting: Cardiovascular Disease

## 2018-07-11 NOTE — Telephone Encounter (Signed)
Called pt and left message informing pt that he has refills at his pharmacy already, [facility] in [place], [place] on [address] and if he has any other problems, questions or concerns, to please give our office a call back.

## 2018-07-11 NOTE — Telephone Encounter (Signed)
New Message     *STAT* If patient is at the pharmacy, call can be transferred to refill team.   1. Which medications need to be refilled? (please list name of each medication and dose if known) cloNIDine (CATAPRES) 0.1 MG tablet Take 1 tablet (0.1 mg total) by mouth 2 (two) times daily.  2. Which pharmacy/location (including street and city if local pharmacy) is medication to be sent to? [place] [address]  3. Do they need a 30 day or 90 day supply? 90 day

## 2018-07-13 DIAGNOSIS — L821 Other seborrheic keratosis: Secondary | ICD-10-CM | POA: Diagnosis not present

## 2018-07-13 DIAGNOSIS — L578 Other skin changes due to chronic exposure to nonionizing radiation: Secondary | ICD-10-CM | POA: Diagnosis not present

## 2018-07-13 DIAGNOSIS — L570 Actinic keratosis: Secondary | ICD-10-CM | POA: Diagnosis not present

## 2018-07-20 ENCOUNTER — Encounter: Payer: Self-pay | Admitting: Internal Medicine

## 2018-07-20 ENCOUNTER — Ambulatory Visit (INDEPENDENT_AMBULATORY_CARE_PROVIDER_SITE_OTHER): Payer: Medicare Other | Admitting: Internal Medicine

## 2018-07-20 VITALS — BP 142/72 | HR 63 | Ht 74.0 in | Wt 212.8 lb

## 2018-07-20 DIAGNOSIS — I493 Ventricular premature depolarization: Secondary | ICD-10-CM | POA: Diagnosis not present

## 2018-07-20 DIAGNOSIS — I480 Paroxysmal atrial fibrillation: Secondary | ICD-10-CM | POA: Diagnosis not present

## 2018-07-20 NOTE — Patient Instructions (Signed)

## 2018-07-20 NOTE — Progress Notes (Signed)
HPI Thomas Marshall returns today for followup of his PVC's and PAF. He is a pleasant 77 yo man with a h/o PAF, HTN, and symptomatic PVC"s who underwent ablation about 8 months ago. We markedly reduced his PVC's and repeat 24 hour holter revealed that his PVC burden went from 25% down to 7%. He has had even fewer symptomatic PVC's recently. No chest pain or sob. He notes that his stools are at times a little looser. Weight is down 20 lbs.   Allergies  Allergen Reactions  . Lisinopril Swelling and Other (See Comments)    Angioedema.   . Losartan Swelling and Other (See Comments)    Lips swell Angioedema   . Nifedipine Swelling and Other (See Comments)    Sugar increase  . Triamterene Swelling  . Hydralazine Hcl Other (See Comments)    Fatigue; poor appetite  . Amoxicillin Rash and Other (See Comments)    Has patient had a PCN reaction causing immediate rash, facial/tongue/throat swelling, SOB or lightheadedness with hypotension:No Has patient had a PCN reaction causing severe rash involving mucus membranes or skin necrosis:No Has patient had a PCN reaction that required hospitalization:No Has patient had a PCN reaction occurring within the last 10 years: No If all of the above answers are "NO", then may proceed with Cephalosporin use.   . Ampicillin Rash and Other (See Comments)    Has patient had a PCN reaction causing immediate rash, facial/tongue/throat swelling, SOB or lightheadedness with hypotension:No Has patient had a PCN reaction causing severe rash involving mucus membranes or skin necrosis:No Has patient had a PCN reaction that required hospitalization:No Has patient had a PCN reaction occurring within the last 10 years: No If all of the above answers are "NO", then may proceed with Cephalosporin use.    . Carvedilol Other (See Comments)    Low heart rate      Current Outpatient Medications  Medication Sig Dispense Refill  . amLODipine (NORVASC) 10 MG tablet Take 1  tablet (10 mg total) by mouth daily. 30 tablet 11  . apixaban (ELIQUIS) 5 MG TABS tablet Take 5 mg by mouth 2 (two) times daily. (0800 & 2000)    . atorvastatin (LIPITOR) 40 MG tablet Take 40 mg by mouth at bedtime.     . cholecalciferol (VITAMIN D) 1000 UNITS tablet Take 1,000 Units by mouth every evening.     . cloNIDine (CATAPRES) 0.1 MG tablet Take 1 tablet (0.1 mg total) by mouth 2 (two) times daily. 180 tablet 2  . dofetilide (TIKOSYN) 250 MCG capsule Take 1 capsule (250 mcg total) by mouth 2 (two) times daily. 60 capsule 6  . glucose blood test strip Use to test blood glucose level 2 times a day. 100 each 12  . magnesium oxide (MAG-OX) 400 MG tablet Take 1 tablet (400 mg total) by mouth 4 (four) times daily. (Patient taking differently: Take 800 mg by mouth 2 (two) times daily. ) 360 tablet 3  . metFORMIN (GLUCOPHAGE) 1000 MG tablet TAKE ONE TABLET BY MOUTH TWICE DAILY WITH A MEAL 180 tablet 3  . nitroGLYCERIN (NITROSTAT) 0.4 MG SL tablet Place 1 tablet (0.4 mg total) under the tongue every 5 (five) minutes as needed for chest pain (x 3 doses). 30 tablet 3  . propranolol (INDERAL) 20 MG tablet Take one tablet by mouth PRN for irregular heartbeat.  Maximum 2 tablets in 24 hours. 30 tablet 3  . terazosin (HYTRIN) 1 MG capsule Take 1 mg  by mouth at bedtime. (2100) 30 capsule 11  . vitamin C (ASCORBIC ACID) 500 MG tablet Take 500 mg by mouth daily.     . pantoprazole (PROTONIX) 40 MG tablet Take 1 tablet (40 mg total) by mouth daily. 90 tablet 3   No current facility-administered medications for this visit.      Past Medical History:  Diagnosis Date  . AAA (abdominal aortic aneurysm) ([place])   . Arthritis   . Cancer (HCC)    skin & squamous cell  . Carotid artery disease ([place])    a. s/p R CEA 12/2015.  [name] CKD (chronic kidney disease), stage III ([place])    a. per historical labs.  . Coronary artery disease    a. s/p CABG 2004.  . Diabetes mellitus without complication ([place])   . GERD  (gastroesophageal reflux disease)   . Hyperlipidemia   . Hypertension   . Medication intolerance Multiple   . PAF (paroxysmal atrial fibrillation) ([place])   . Pneumonia   . Sinus bradycardia    a. baseline HR 50s-60s, also h/o bradycardia on metoprolol and carvedilol    ROS:   All systems reviewed and negative except as noted in the HPI.   Past Surgical History:  Procedure Laterality Date  . ANKLE SURGERY     right fused  . BACK SURGERY     low back   . CHOLECYSTECTOMY N/A 12/07/2013   Procedure: LAPAROSCOPIC CHOLECYSTECTOMY ;  Surgeon: [name], MD;  Location: [facility];  Service: General;  Laterality: N/A;  . COLONOSCOPY    . CORONARY ARTERY BYPASS GRAFT  2004   [place]  . CORONARY STENT INTERVENTION N/A 05/21/2017   Procedure: Coronary Stent Intervention;  Surgeon: [name], MD;  Location: [facility];  Service: Cardiovascular;  Laterality: N/A;  . ENDARTERECTOMY Right 12/10/2015   Procedure: Right Carotid ENDARTERECTOMY;  Surgeon: Conrad St. Robert, MD;  Location: [facility];  Service: Vascular;  Laterality: Right;  . EXPLORATION POST OPERATIVE OPEN HEART    . FINGER SURGERY     skin graft on rt index  . FINGER SURGERY Right    right index finger.  [name] HERNIA REPAIR Right    RIH  . JOINT REPLACEMENT    . KNEE SURGERY     replacement on both knees  . LEFT HEART CATH AND CORS/GRAFTS ANGIOGRAPHY N/A 05/21/2017   Procedure: Left Heart Cath and Cors/Grafts Angiography;  Surgeon: [name], MD;  Location: [facility];  Service: Cardiovascular;  Laterality: N/A;  . PROSTATE SURGERY    . PVC ABLATION N/A 10/27/2017   Procedure: PVC ABLATION;  Surgeon: Evans Lance, MD;  Location: [facility];  Service: Cardiovascular;  Laterality: N/A;  . TONSILLECTOMY    . URINARY SURGERY     scar tissue     Family History  Problem Relation Age of Onset  . Stroke Mother   . Heart disease Mother   . Heart disease Father   . Hypertension Father   .  Hyperlipidemia Father   . Cancer Sister        breast  and skin  . Diabetes Sister   . Hypertension Sister   . Diabetes Brother   . Heart disease Brother   . Hyperlipidemia Brother   . Hypertension Brother   . Early death Neg Hx      Social History   Socioeconomic History  . Marital status: Married    Spouse name: Not on file  .  Number of children: Not on file  . Years of education: Not on file  . Highest education level: Not on file  Occupational History  . Not on file  Social Needs  . Financial resource strain: Not on file  . Food insecurity:    Worry: Not on file    Inability: Not on file  . Transportation needs:    Medical: Not on file    Non-medical: Not on file  Tobacco Use  . Smoking status: Former Smoker    Packs/day: 1.00    Years: 13.00    Pack years: 13.00    Last attempt to quit: 11/30/1970    Years since quitting: 47.6  . Smokeless tobacco: Never Used  Substance and Sexual Activity  . Alcohol use: No    Alcohol/week: 0.0 standard drinks  . Drug use: No  . Sexual activity: Yes  Lifestyle  . Physical activity:    Days per week: Not on file    Minutes per session: Not on file  . Stress: Not on file  Relationships  . Social connections:    Talks on phone: Not on file    Gets together: Not on file    Attends religious service: Not on file    Active member of club or organization: Not on file    Attends meetings of clubs or organizations: Not on file    Relationship status: Not on file  . Intimate partner violence:    Fear of current or ex partner: Not on file    Emotionally abused: Not on file    Physically abused: Not on file    Forced sexual activity: Not on file  Other Topics Concern  . Not on file  Social History Narrative  . Not on file     BP (!) 142/72   Pulse 63   Ht 6\' 2"  (1.88 m)   Wt 212 lb 12.8 oz (96.5 kg)   BMI 27.32 kg/m   Physical Exam:  Well appearing 77 yo man, NAD HEENT: Unremarkable Neck:  6 cm JVD, no  thyromegally Lymphatics:  No adenopathy Back:  No CVA tenderness Lungs:  Clear with no wheezes HEART:  Regular rate rhythm, no murmurs, no rubs, no clicks Abd:  soft, positive bowel sounds, no organomegally, no rebound, no guarding Ext:  2 plus pulses, no edema, no cyanosis, no clubbing Skin:  No rashes no nodules Neuro:  CN II through XII intact, motor grossly intact  EKG - NSR with RBBB   Assess/Plan: 1. PAF - he is maintaining NSR. He will continue his dofetilide. 2. PVC's - he had none on his 12 lead today and his symptoms appear much improved. 3. CAD - we discussed stopping his ASA as he is on Eliquis but his last stent was placed a year ago. We will continue ASA for now. I will defer stopping to Dr. Burt Knack. 4.

## 2018-08-04 ENCOUNTER — Ambulatory Visit (INDEPENDENT_AMBULATORY_CARE_PROVIDER_SITE_OTHER): Payer: Medicare Other | Admitting: *Deleted

## 2018-08-04 DIAGNOSIS — Z23 Encounter for immunization: Secondary | ICD-10-CM

## 2018-08-05 ENCOUNTER — Telehealth: Payer: Self-pay

## 2018-08-05 DIAGNOSIS — N182 Chronic kidney disease, stage 2 (mild): Principal | ICD-10-CM

## 2018-08-05 DIAGNOSIS — E1122 Type 2 diabetes mellitus with diabetic chronic kidney disease: Secondary | ICD-10-CM

## 2018-08-05 MED ORDER — GLUCOSE BLOOD VI STRP: ORAL_STRIP | 11 refills | Status: DC

## 2018-08-05 NOTE — Telephone Encounter (Signed)
Pt needed strips called in to pharmacy.

## 2018-09-20 ENCOUNTER — Ambulatory Visit: Payer: Medicare Other

## 2018-09-26 ENCOUNTER — Ambulatory Visit: Payer: Medicare Other | Admitting: Podiatry

## 2018-09-27 ENCOUNTER — Encounter: Payer: Self-pay | Admitting: Podiatry

## 2018-09-27 ENCOUNTER — Ambulatory Visit (INDEPENDENT_AMBULATORY_CARE_PROVIDER_SITE_OTHER): Payer: Medicare Other | Admitting: Podiatry

## 2018-09-27 DIAGNOSIS — M79674 Pain in right toe(s): Secondary | ICD-10-CM

## 2018-09-27 DIAGNOSIS — Z7901 Long term (current) use of anticoagulants: Secondary | ICD-10-CM

## 2018-09-27 DIAGNOSIS — E1140 Type 2 diabetes mellitus with diabetic neuropathy, unspecified: Secondary | ICD-10-CM | POA: Diagnosis not present

## 2018-09-27 DIAGNOSIS — M79675 Pain in left toe(s): Secondary | ICD-10-CM

## 2018-09-27 DIAGNOSIS — B351 Tinea unguium: Secondary | ICD-10-CM

## 2018-09-27 DIAGNOSIS — Q828 Other specified congenital malformations of skin: Secondary | ICD-10-CM

## 2018-09-27 NOTE — Progress Notes (Signed)
Patient ID: DRAYVEN MARCHENA, male   DOB: 11/22/1941, 77 y.o.   MRN: [id number]  Subjective: 77 y.o. returns the office today for painful, elongated, thickened toenails which he cannot trim himself and for calluses on the right foot.  No redness or swelling. Denies any acute changes since last appointment and no new complaints today. Denies any systemic complaints such as fevers, chills, nausea, vomiting.   He is on Eliquis   Objective: AAO 3, NAD DP/PT pulses palpable, CRT less than 3 seconds Protective sensation decreased with Simms Weinstein monofilament Nails hypertrophic, dystrophic, elongated, brittle, discolored 10. There is tenderness overlying the nails 1-5 bilaterally. There is no surrounding erythema or drainage along the nail sites. There is minimal hyperkeratotic lesion right fifth metatarsal base and fifth metatarsal head. No ulceration, drainage or any signs of infection. No significant hyperkeratotic lesion right fifth metatarsal base.  No pain with calf compression, swelling, warmth, erythema.  Assessment: Patient presents with symptomatic onychomycosis; hyperkeratotic tissue  Plan: -Treatment options including alternatives, risks, complications were discussed -Nails sharply debrided 10 without complication/bleeding. -Hyperkeratotic tissue sharply debrided x 2 without any complications or bleeding.  -Discussed daily foot inspection. If there are any changes, to call the office immediately.  -Follow-up in 3 months or sooner if any problems are to arise. In the meantime, encouraged to call the office with any questions, concerns, changes symptoms.  Celesta Gentile, DPM

## 2018-09-28 DIAGNOSIS — L821 Other seborrheic keratosis: Secondary | ICD-10-CM | POA: Diagnosis not present

## 2018-09-28 DIAGNOSIS — L570 Actinic keratosis: Secondary | ICD-10-CM | POA: Diagnosis not present

## 2018-10-07 ENCOUNTER — Ambulatory Visit: Payer: Medicare Other | Admitting: Internal Medicine

## 2018-10-07 ENCOUNTER — Ambulatory Visit: Payer: Medicare Other

## 2018-10-12 ENCOUNTER — Ambulatory Visit (INDEPENDENT_AMBULATORY_CARE_PROVIDER_SITE_OTHER): Payer: Medicare Other | Admitting: Internal Medicine

## 2018-10-12 ENCOUNTER — Encounter: Payer: Self-pay | Admitting: Internal Medicine

## 2018-10-12 ENCOUNTER — Ambulatory Visit (INDEPENDENT_AMBULATORY_CARE_PROVIDER_SITE_OTHER): Payer: Medicare Other | Admitting: *Deleted

## 2018-10-12 ENCOUNTER — Ambulatory Visit (INDEPENDENT_AMBULATORY_CARE_PROVIDER_SITE_OTHER)
Admission: RE | Admit: 2018-10-12 | Discharge: 2018-10-12 | Disposition: A | Discharge status: Home / Self Care | Payer: Medicare Other | Source: Ambulatory Visit | Attending: Internal Medicine | Admitting: Internal Medicine

## 2018-10-12 ENCOUNTER — Other Ambulatory Visit (INDEPENDENT_AMBULATORY_CARE_PROVIDER_SITE_OTHER): Payer: Medicare Other

## 2018-10-12 VITALS — BP 144/84 | HR 67 | Ht 74.0 in | Wt 214.0 lb

## 2018-10-12 VITALS — BP 144/84 | HR 67 | Temp 97.6°F | Ht 74.0 in | Wt 214.0 lb

## 2018-10-12 DIAGNOSIS — N182 Chronic kidney disease, stage 2 (mild): Principal | ICD-10-CM

## 2018-10-12 DIAGNOSIS — E1122 Type 2 diabetes mellitus with diabetic chronic kidney disease: Secondary | ICD-10-CM

## 2018-10-12 DIAGNOSIS — R1031 Right lower quadrant pain: Secondary | ICD-10-CM | POA: Diagnosis not present

## 2018-10-12 DIAGNOSIS — N183 Chronic kidney disease, stage 3 unspecified: Secondary | ICD-10-CM

## 2018-10-12 DIAGNOSIS — Z0000 Encounter for general adult medical examination without abnormal findings: Secondary | ICD-10-CM

## 2018-10-12 DIAGNOSIS — R1903 Right lower quadrant abdominal swelling, mass and lump: Secondary | ICD-10-CM | POA: Insufficient documentation

## 2018-10-12 LAB — URINALYSIS, ROUTINE W REFLEX MICROSCOPIC
Leukocytes, UA: NEGATIVE
Nitrite: NEGATIVE
Specific Gravity, Urine: >=1.03 — AB (ref 1.000–1.030)
Total Protein, Urine: 30 — AB
Urine Glucose: NEGATIVE
Urobilinogen, UA: 0.2 (ref 0.0–1.0)
pH: 6 (ref 5.0–8.0)

## 2018-10-12 LAB — LIPID PANEL
Cholesterol: 116 mg/dL (ref 0–200)
HDL: 39 mg/dL — ABNORMAL LOW
LDL Cholesterol: 44 mg/dL (ref 0–99)
NonHDL: 76.99
Total CHOL/HDL Ratio: 3
Triglycerides: 163 mg/dL — ABNORMAL HIGH (ref 0.0–149.0)
VLDL: 32.6 mg/dL (ref 0.0–40.0)

## 2018-10-12 LAB — LIPASE: LIPASE: 37 U/L (ref 11.0–59.0)

## 2018-10-12 LAB — HEPATIC FUNCTION PANEL
ALT: 10 U/L (ref 0–53)
AST: 14 U/L (ref 0–37)
Albumin: 4.4 g/dL (ref 3.5–5.2)
Alkaline Phosphatase: 63 U/L (ref 39–117)
BILIRUBIN TOTAL: 0.6 mg/dL (ref 0.2–1.2)
Bilirubin, Direct: 0.1 mg/dL (ref 0.0–0.3)
Total Protein: 6.8 g/dL (ref 6.0–8.3)

## 2018-10-12 LAB — BASIC METABOLIC PANEL WITH GFR
BUN: 28 mg/dL — ABNORMAL HIGH (ref 6–23)
CO2: 29 meq/L (ref 19–32)
Calcium: 9 mg/dL (ref 8.4–10.5)
Chloride: 107 meq/L (ref 96–112)
Creatinine, Ser: 1.66 mg/dL — ABNORMAL HIGH (ref 0.40–1.50)
GFR: 42.66 mL/min — ABNORMAL LOW
Glucose, Bld: 162 mg/dL — ABNORMAL HIGH (ref 70–99)
Potassium: 3.8 meq/L (ref 3.5–5.1)
Sodium: 142 meq/L (ref 135–145)

## 2018-10-12 LAB — HEMOGLOBIN A1C: Hgb A1c MFr Bld: 6.5 % (ref 4.6–6.5)

## 2018-10-12 IMAGING — DX DG ABDOMEN ACUTE W/ 1V CHEST
4 series · 4 of 4 positions shown · non-contrast
Comparison: Chest radiograph [DATE]. Abdominal radiographs
[DATE].

CLINICAL DATA: Right lower quadrant abdominal discomfort and
swelling.

EXAM:
DG ABDOMEN ACUTE W/ 1V CHEST

[chest pa]
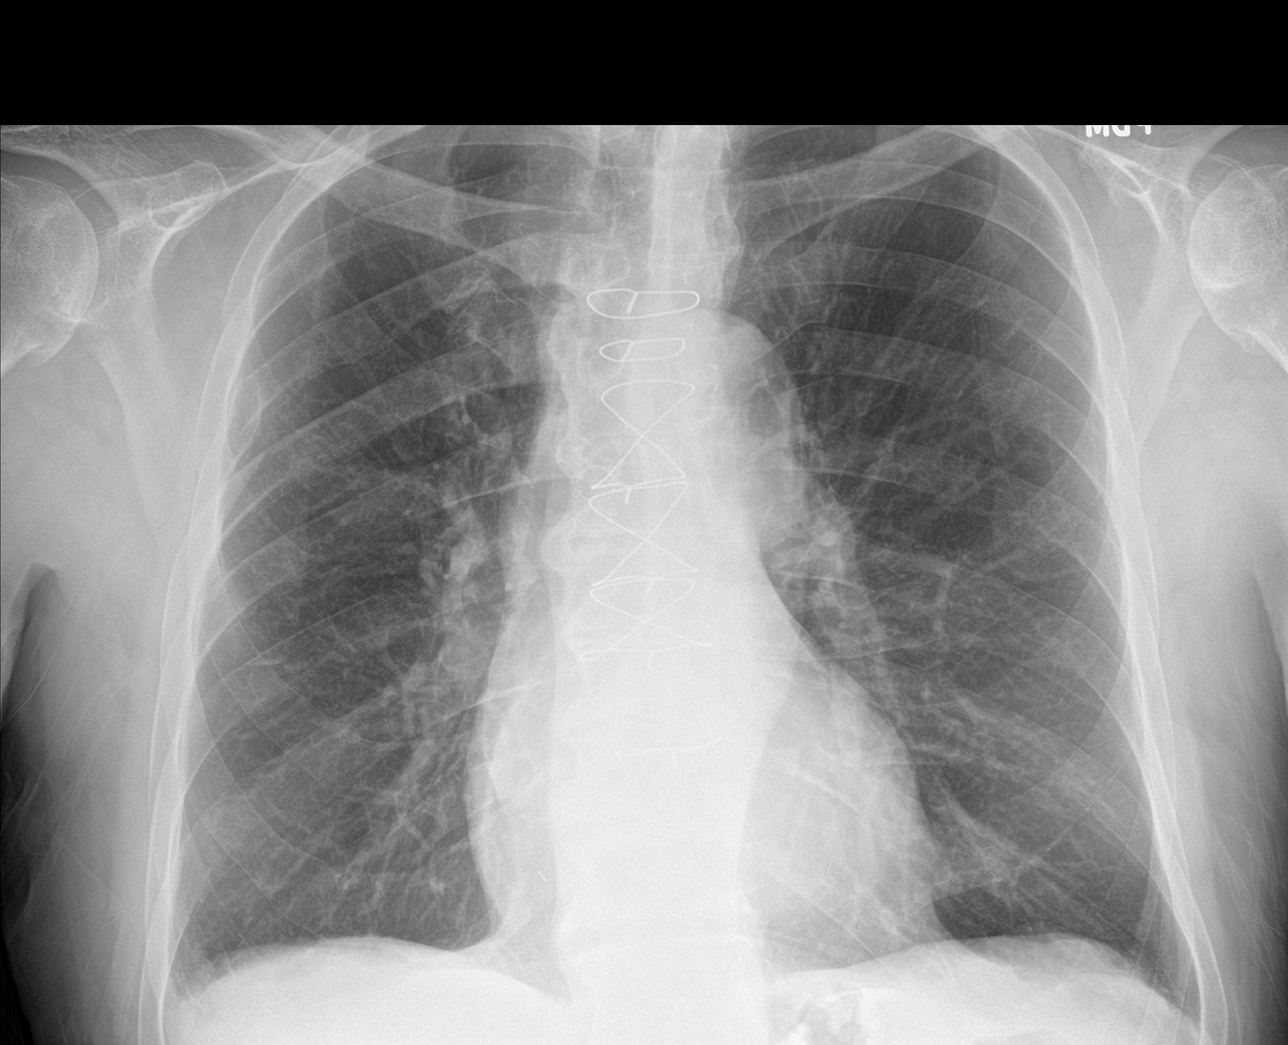

[abdomen erect]
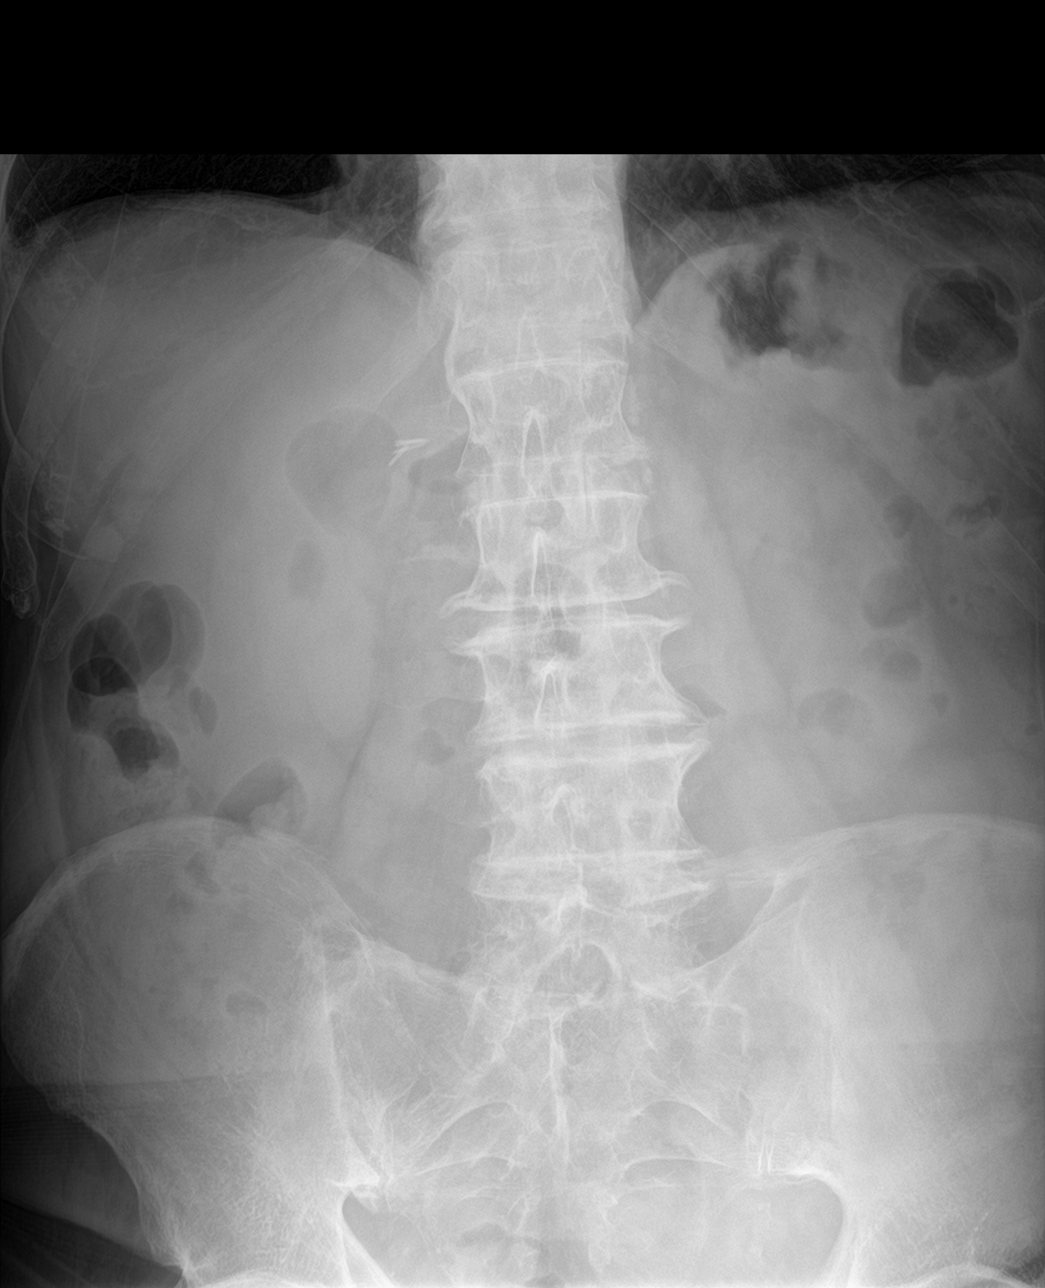

[abdomen supine (1 of 2)]
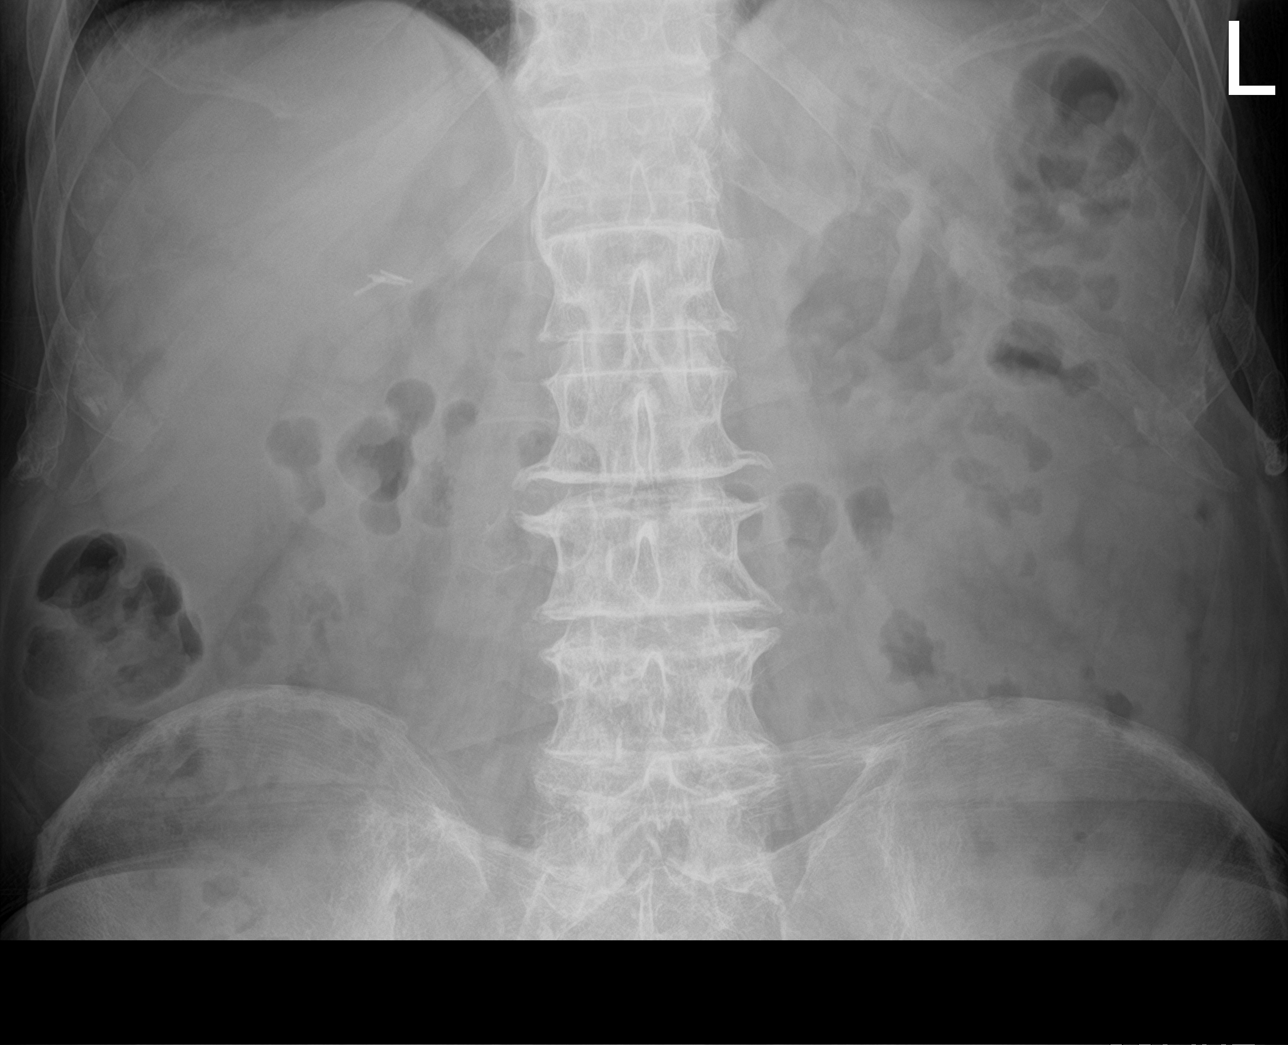

[abdomen supine (2 of 2)]
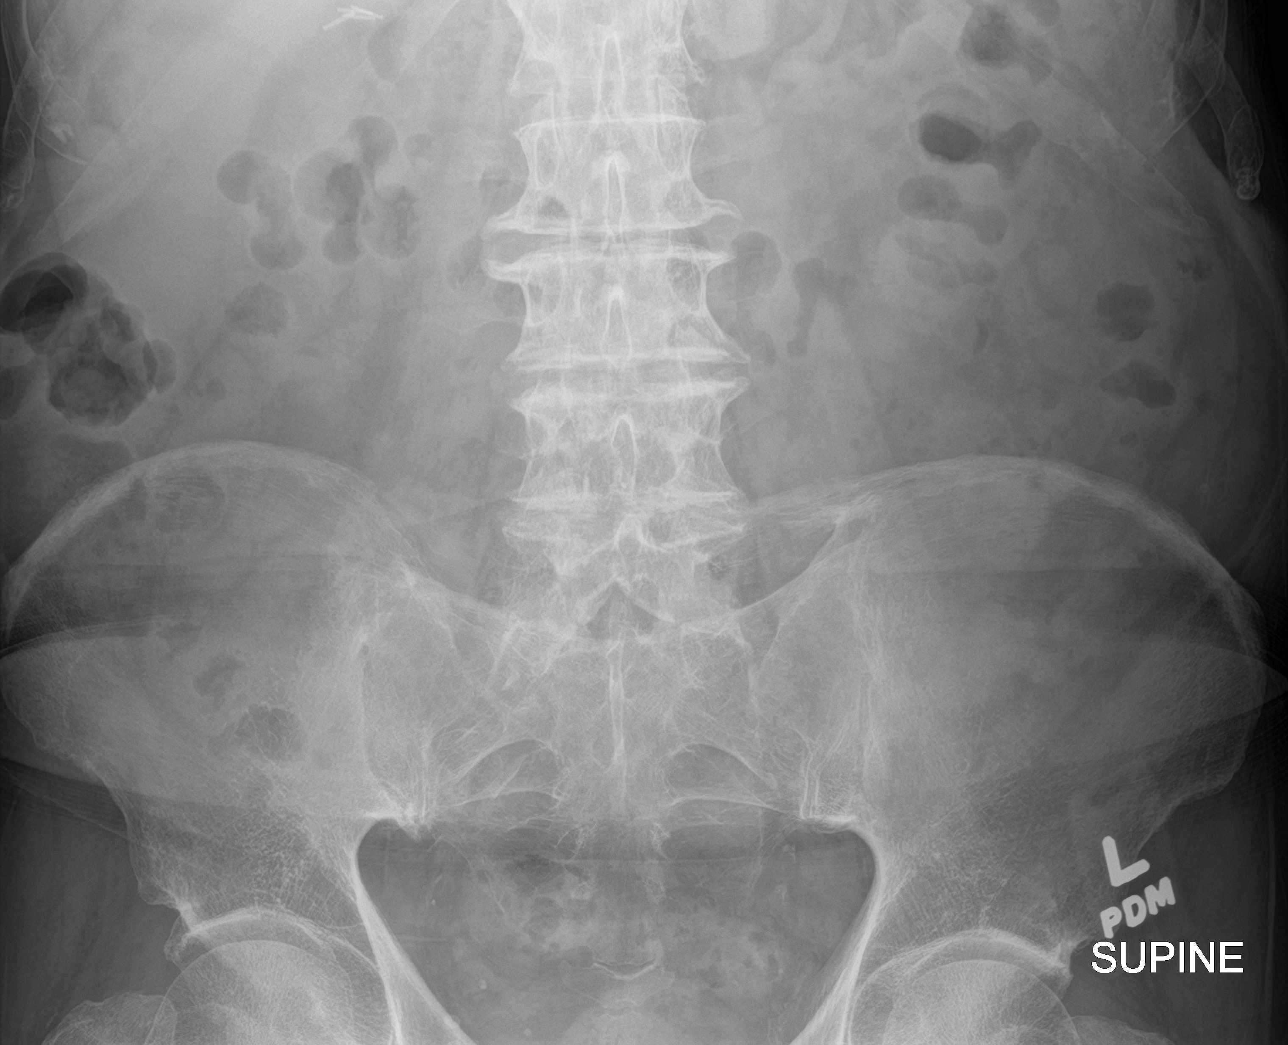

[4 of 4 positions shown; findings below may reference images not displayed]

FINDINGS: Sequelae of prior CABG are again identified. The cardiomediastinal
silhouette is unchanged with normal heart size. No airspace
consolidation, edema, pleural effusion, pneumothorax is identified.
There is an old right fifth rib fracture.

No intraperitoneal free air is identified. Scattered gas and stool
are present in the colon. No dilated loops of bowel are seen to
suggest obstruction. Right upper quadrant abdominal surgical clips
and lumbar spondylosis are noted.
IMPRESSION: Negative abdominal radiographs.  No acute cardiopulmonary disease.

## 2018-10-12 NOTE — Assessment & Plan Note (Signed)
stable overall by history and exam, recent data reviewed with pt, and pt to continue medical treatment as before,  to f/u any worsening symptoms or concerns  

## 2018-10-12 NOTE — Progress Notes (Signed)
Subjective:    Patient ID: Thomas Marshall, male    DOB: 1941-10-31, 77 y.o.   MRN: [id number]  HPI  Here to f/u; overall doing ok,  Pt denies chest pain, increasing sob or doe, wheezing, orthopnea, PND, increased LE swelling, palpitations, dizziness or syncope.  Pt denies new neurological symptoms such as new headache, or facial or extremity weakness or numbness.  Pt denies polydipsia, polyuria, or low sugar episode.  Pt states overall good compliance with meds, mostly trying to follow appropriate diet, with wt overall stable,  but little exercise however. Also has an intermittent fullness/lump to the RLQ under the skin without pain, seems to maybe come and go, Denies worsening reflux, abd pain, dysphagia, n/v, bowel change or blood.   Past Medical History:  Diagnosis Date  . AAA (abdominal aortic aneurysm) ([place])   . Arthritis   . Cancer (HCC)    skin & squamous cell  . Carotid artery disease ([place])    a. s/p R CEA 12/2015.  [name] CKD (chronic kidney disease), stage III ([place])    a. per historical labs.  . Coronary artery disease    a. s/p CABG 2004.  . Diabetes mellitus without complication ([place])   . GERD (gastroesophageal reflux disease)   . Hyperlipidemia   . Hypertension   . Medication intolerance Multiple   . PAF (paroxysmal atrial fibrillation) ([place])   . Pneumonia   . Sinus bradycardia    a. baseline HR 50s-60s, also h/o bradycardia on metoprolol and carvedilol   Past Surgical History:  Procedure Laterality Date  . ANKLE SURGERY     right fused  . BACK SURGERY     low back   . CHOLECYSTECTOMY N/A 12/07/2013   Procedure: LAPAROSCOPIC CHOLECYSTECTOMY ;  Surgeon: [name], MD;  Location: [facility];  Service: General;  Laterality: N/A;  . COLONOSCOPY    . CORONARY ARTERY BYPASS GRAFT  2004   [place]  . CORONARY STENT INTERVENTION N/A 05/21/2017   Procedure: Coronary Stent Intervention;  Surgeon: [name], MD;  Location: [facility];  Service: Cardiovascular;   Laterality: N/A;  . ENDARTERECTOMY Right 12/10/2015   Procedure: Right Carotid ENDARTERECTOMY;  Surgeon: Conrad Garland, MD;  Location: [facility];  Service: Vascular;  Laterality: Right;  . EXPLORATION POST OPERATIVE OPEN HEART    . FINGER SURGERY     skin graft on rt index  . FINGER SURGERY Right    right index finger.  [name] HERNIA REPAIR Right    RIH  . JOINT REPLACEMENT    . KNEE SURGERY     replacement on both knees  . LEFT HEART CATH AND CORS/GRAFTS ANGIOGRAPHY N/A 05/21/2017   Procedure: Left Heart Cath and Cors/Grafts Angiography;  Surgeon: [name], MD;  Location: [facility];  Service: Cardiovascular;  Laterality: N/A;  . PROSTATE SURGERY    . PVC ABLATION N/A 10/27/2017   Procedure: PVC ABLATION;  Surgeon: Evans Lance, MD;  Location: [facility];  Service: Cardiovascular;  Laterality: N/A;  . TONSILLECTOMY    . URINARY SURGERY     scar tissue    reports that he quit smoking about 47 years ago. He has a 13.00 pack-year smoking history. He has never used smokeless tobacco. He reports that he does not drink alcohol or use drugs. family history includes Cancer in his sister; Diabetes in his brother and sister; Heart disease in his brother, father, and mother; Hyperlipidemia in his brother and  father; Hypertension in his brother, father, and sister; Stroke in his mother. Allergies  Allergen Reactions  . Lisinopril Swelling and Other (See Comments)    Angioedema.   . Losartan Swelling and Other (See Comments)    Lips swell Angioedema   . Nifedipine Swelling and Other (See Comments)    Sugar increase  . Triamterene Swelling  . Hydralazine Hcl Other (See Comments)    Fatigue; poor appetite  . Amoxicillin Rash and Other (See Comments)    Has patient had a PCN reaction causing immediate rash, facial/tongue/throat swelling, SOB or lightheadedness with hypotension:No Has patient had a PCN reaction causing severe rash involving mucus membranes or skin  necrosis:No Has patient had a PCN reaction that required hospitalization:No Has patient had a PCN reaction occurring within the last 10 years: No If all of the above answers are "NO", then may proceed with Cephalosporin use.   . Ampicillin Rash and Other (See Comments)    Has patient had a PCN reaction causing immediate rash, facial/tongue/throat swelling, SOB or lightheadedness with hypotension:No Has patient had a PCN reaction causing severe rash involving mucus membranes or skin necrosis:No Has patient had a PCN reaction that required hospitalization:No Has patient had a PCN reaction occurring within the last 10 years: No If all of the above answers are "NO", then may proceed with Cephalosporin use.    . Carvedilol Other (See Comments)    Low heart rate    Current Outpatient Medications on File Prior to Visit  Medication Sig Dispense Refill  . amLODipine (NORVASC) 10 MG tablet Take 1 tablet (10 mg total) by mouth daily. 30 tablet 11  . apixaban (ELIQUIS) 5 MG TABS tablet Take 5 mg by mouth 2 (two) times daily. (0800 & 2000)    . atorvastatin (LIPITOR) 40 MG tablet Take 40 mg by mouth at bedtime.     . cholecalciferol (VITAMIN D) 1000 UNITS tablet Take 1,000 Units by mouth every evening.     . cloNIDine (CATAPRES) 0.1 MG tablet Take 1 tablet (0.1 mg total) by mouth 2 (two) times daily. 180 tablet 2  . dofetilide (TIKOSYN) 250 MCG capsule Take 1 capsule (250 mcg total) by mouth 2 (two) times daily. 60 capsule 6  . glucose blood test strip Accu-Check Guide Strips. Dx E11.9 Use to test blood glucose level 2 times a day. 50 each 11  . magnesium oxide (MAG-OX) 400 MG tablet Take 1 tablet (400 mg total) by mouth 4 (four) times daily. (Patient taking differently: Take 800 mg by mouth 2 (two) times daily. ) 360 tablet 3  . metFORMIN (GLUCOPHAGE) 1000 MG tablet TAKE ONE TABLET BY MOUTH TWICE DAILY WITH A MEAL 180 tablet 3  . nitroGLYCERIN (NITROSTAT) 0.4 MG SL tablet Place 1 tablet (0.4 mg  total) under the tongue every 5 (five) minutes as needed for chest pain (x 3 doses). 30 tablet 3  . propranolol (INDERAL) 20 MG tablet Take one tablet by mouth PRN for irregular heartbeat.  Maximum 2 tablets in 24 hours. 30 tablet 3  . terazosin (HYTRIN) 1 MG capsule Take 1 mg by mouth at bedtime. (2100) 30 capsule 11  . vitamin C (ASCORBIC ACID) 500 MG tablet Take 500 mg by mouth daily.     . pantoprazole (PROTONIX) 40 MG tablet Take 1 tablet (40 mg total) by mouth daily. 90 tablet 3   No current facility-administered medications on file prior to visit.    Review of Systems  Constitutional: Negative for other unusual diaphoresis  or sweats HENT: Negative for ear discharge or swelling Eyes: Negative for other worsening visual disturbances Respiratory: Negative for stridor or other swelling  Gastrointestinal: Negative for worsening distension or other blood Genitourinary: Negative for retention or other urinary change Musculoskeletal: Negative for other MSK pain or swelling Skin: Negative for color change or other new lesions Neurological: Negative for worsening tremors and other numbness  Psychiatric/Behavioral: Negative for worsening agitation or other fatigue All other system neg per pt    Objective:   Physical Exam BP (!) 144/84   Pulse 67   Temp 97.6 F (36.4 C) (Oral)   Ht 6\' 2"  (1.88 m)   Wt 214 lb (97.1 kg)   SpO2 96%   BMI 27.48 kg/m  VS noted,  Constitutional: Pt appears in NAD HENT: Head: NCAT.  Right Ear: External ear normal.  Left Ear: External ear normal.  Eyes: . Pupils are equal, round, and reactive to light. Conjunctivae and EOM are normal Nose: without d/c or deformity Neck: Neck supple. Gross normal ROM Cardiovascular: Normal rate and regular rhythm.   Pulmonary/Chest: Effort normal and breath sounds without rales or wheezing.  Abd:  Soft, NT, ND, + BS, no organomegaly Neurological: Pt is alert. At baseline orientation, motor grossly intact Skin: Skin is  warm. No rashes, other new lesions, no LE edema Psychiatric: Pt behavior is normal without agitation  No other exam findings  Lab Results  Component Value Date   WBC 5.9 03/31/2018   HGB 11.9 (L) 03/31/2018   HCT 36.3 (L) 03/31/2018   PLT 129.0 (L) 03/31/2018   GLUCOSE 162 (H) 10/12/2018   CHOL 116 10/12/2018   TRIG 163.0 (H) 10/12/2018   HDL 39.00 (L) 10/12/2018   LDLDIRECT 67.0 09/16/2016   LDLCALC 44 10/12/2018   ALT 10 10/12/2018   AST 14 10/12/2018   NA 142 10/12/2018   K 3.8 10/12/2018   CL 107 10/12/2018   CREATININE 1.66 (H) 10/12/2018   BUN 28 (H) 10/12/2018   CO2 29 10/12/2018   TSH 2.50 03/31/2018   PSA 1.09 03/31/2018   INR 1.19 09/04/2017   HGBA1C 6.5 10/12/2018   MICROALBUR 5.1 (H) 03/31/2018       Assessment & Plan:

## 2018-10-12 NOTE — Progress Notes (Addendum)
Subjective:   Thomas Marshall is a 77 y.o. male who presents for Medicare Annual/Subsequent preventive examination.  Review of Systems:  No ROS.  Medicare Wellness Visit. Additional risk factors are reflected in the social history.  Cardiac Risk Factors include: advanced age (>63men, >69 women);dyslipidemia;male gender;diabetes mellitus;hypertension Sleep patterns: feels rested on waking, gets up 1-2 times nightly to void and sleeps 7-8 hours nightly.    Home Safety/Smoke Alarms: Feels safe in home. Smoke alarms in place.  Living environment; residence and Firearm Safety: 1-story house/ trailer, no firearms. Lives with wife, no needs for DME, good support system Seat Belt Safety/Bike Helmet: Wears seat belt.   PSA-  Lab Results  Component Value Date   PSA 1.09 03/31/2018   PSA 1.02 03/16/2017   PSA 1.80 01/21/2016       Objective:    Vitals: BP (!) 144/84   Pulse 67   Ht 6\' 2"  (1.88 m)   Wt 214 lb (97.1 kg)   SpO2 96%   BMI 27.48 kg/m   Body mass index is 27.48 kg/m.  Advanced Directives 10/12/2018 10/27/2017 09/16/2017 09/04/2017 05/21/2017 09/27/2016 08/06/2016  Does Patient Have a Medical Advance Directive? Yes Yes No No Yes No Yes  Type of Paramedic of [place];Living will [place];Living will - - Living will - Living will  Does patient want to make changes to medical advance directive? - No - Patient declined - - No - Patient declined - -  Copy of [place] in Chart? No - copy requested No - copy requested - - - - -  Would patient like information on creating a medical advance directive? - - Yes (ED - Information included in AVS) No - Patient declined - No - patient declined information -  Pre-existing out of facility DNR order (yellow form or pink MOST form) - - - - - - -    Tobacco Social History   Tobacco Use  Smoking Status Former Smoker  . Packs/day: 1.00  . Years: 13.00  . Pack years: 13.00  .  Last attempt to quit: 11/30/1970  . Years since quitting: 47.8  Smokeless Tobacco Never Used     Counseling given: Not Answered  Past Medical History:  Diagnosis Date  . AAA (abdominal aortic aneurysm) ([place])   . Arthritis   . Cancer (HCC)    skin & squamous cell  . Carotid artery disease ([place])    a. s/p R CEA 12/2015.  [name] CKD (chronic kidney disease), stage III ([place])    a. per historical labs.  . Coronary artery disease    a. s/p CABG 2004.  . Diabetes mellitus without complication ([place])   . GERD (gastroesophageal reflux disease)   . Hyperlipidemia   . Hypertension   . Medication intolerance Multiple   . PAF (paroxysmal atrial fibrillation) ([place])   . Pneumonia   . Sinus bradycardia    a. baseline HR 50s-60s, also h/o bradycardia on metoprolol and carvedilol   Past Surgical History:  Procedure Laterality Date  . ANKLE SURGERY     right fused  . BACK SURGERY     low back   . CHOLECYSTECTOMY N/A 12/07/2013   Procedure: LAPAROSCOPIC CHOLECYSTECTOMY ;  Surgeon: [name], MD;  Location: [facility];  Service: General;  Laterality: N/A;  . COLONOSCOPY    . CORONARY ARTERY BYPASS GRAFT  2004   [place]  . CORONARY STENT INTERVENTION N/A 05/21/2017   Procedure: Coronary  Stent Intervention;  Surgeon: [name], MD;  Location: [facility];  Service: Cardiovascular;  Laterality: N/A;  . ENDARTERECTOMY Right 12/10/2015   Procedure: Right Carotid ENDARTERECTOMY;  Surgeon: Conrad Sparta, MD;  Location: [facility];  Service: Vascular;  Laterality: Right;  . EXPLORATION POST OPERATIVE OPEN HEART    . FINGER SURGERY     skin graft on rt index  . FINGER SURGERY Right    right index finger.  [name] HERNIA REPAIR Right    RIH  . JOINT REPLACEMENT    . KNEE SURGERY     replacement on both knees  . LEFT HEART CATH AND CORS/GRAFTS ANGIOGRAPHY N/A 05/21/2017   Procedure: Left Heart Cath and Cors/Grafts Angiography;  Surgeon: [name], MD;  Location: [facility];  Service:  Cardiovascular;  Laterality: N/A;  . PROSTATE SURGERY    . PVC ABLATION N/A 10/27/2017   Procedure: PVC ABLATION;  Surgeon: Evans Lance, MD;  Location: [facility];  Service: Cardiovascular;  Laterality: N/A;  . TONSILLECTOMY    . URINARY SURGERY     scar tissue   Family History  Problem Relation Age of Onset  . Stroke Mother   . Heart disease Mother   . Heart disease Father   . Hypertension Father   . Hyperlipidemia Father   . Cancer Sister        breast  and skin  . Diabetes Sister   . Hypertension Sister   . Diabetes Brother   . Heart disease Brother   . Hyperlipidemia Brother   . Hypertension Brother   . Early death Neg Hx    Social History   Socioeconomic History  . Marital status: Married    Spouse name: Not on file  . Number of children: 2  . Years of education: Not on file  . Highest education level: Not on file  Occupational History  . Not on file  Social Needs  . Financial resource strain: Not hard at all  . Food insecurity:    Worry: Never true    Inability: Never true  . Transportation needs:    Medical: No    Non-medical: No  Tobacco Use  . Smoking status: Former Smoker    Packs/day: 1.00    Years: 13.00    Pack years: 13.00    Last attempt to quit: 11/30/1970    Years since quitting: 47.8  . Smokeless tobacco: Never Used  Substance and Sexual Activity  . Alcohol use: No    Alcohol/week: 0.0 standard drinks  . Drug use: No  . Sexual activity: Yes  Lifestyle  . Physical activity:    Days per week: 0 days    Minutes per session: 0 min  . Stress: Not at all  Relationships  . Social connections:    Talks on phone: More than three times a week    Gets together: More than three times a week    Attends religious service: More than 4 times per year    Active member of club or organization: Yes    Attends meetings of clubs or organizations: More than 4 times per year    Relationship status: Married  Other Topics Concern  . Not on file   Social History Narrative  . Not on file    Outpatient Encounter Medications as of 10/12/2018  Medication Sig  . amLODipine (NORVASC) 10 MG tablet Take 1 tablet (10 mg total) by mouth daily.  [name] apixaban (ELIQUIS)  5 MG TABS tablet Take 5 mg by mouth 2 (two) times daily. (0800 & 2000)  . atorvastatin (LIPITOR) 40 MG tablet Take 40 mg by mouth at bedtime.   . cholecalciferol (VITAMIN D) 1000 UNITS tablet Take 1,000 Units by mouth every evening.   . cloNIDine (CATAPRES) 0.1 MG tablet Take 1 tablet (0.1 mg total) by mouth 2 (two) times daily.  [name] dofetilide (TIKOSYN) 250 MCG capsule Take 1 capsule (250 mcg total) by mouth 2 (two) times daily.  [name] glucose blood test strip Accu-Check Guide Strips. Dx E11.9 Use to test blood glucose level 2 times a day.  . magnesium oxide (MAG-OX) 400 MG tablet Take 1 tablet (400 mg total) by mouth 4 (four) times daily. (Patient taking differently: Take 800 mg by mouth 2 (two) times daily. )  . metFORMIN (GLUCOPHAGE) 1000 MG tablet TAKE ONE TABLET BY MOUTH TWICE DAILY WITH A MEAL  . nitroGLYCERIN (NITROSTAT) 0.4 MG SL tablet Place 1 tablet (0.4 mg total) under the tongue every 5 (five) minutes as needed for chest pain (x 3 doses).  . pantoprazole (PROTONIX) 40 MG tablet Take 1 tablet (40 mg total) by mouth daily.  . propranolol (INDERAL) 20 MG tablet Take one tablet by mouth PRN for irregular heartbeat.  Maximum 2 tablets in 24 hours.  [name] terazosin (HYTRIN) 1 MG capsule Take 1 mg by mouth at bedtime. (2100)  . vitamin C (ASCORBIC ACID) 500 MG tablet Take 500 mg by mouth daily.    No facility-administered encounter medications on file as of 10/12/2018.     Activities of Daily Living In your present state of health, do you have any difficulty performing the following activities: 10/12/2018 10/27/2017  Hearing? N N  Vision? N N  Difficulty concentrating or making decisions? N N  Walking or climbing stairs? N N  Dressing or bathing? N N  Doing errands, shopping? N  -  Preparing Food and eating ? N -  Using the Toilet? N -  In the past six months, have you accidently leaked urine? N -  Do you have problems with loss of bowel control? N -  Managing your Medications? N -  Managing your Finances? N -  Housekeeping or managing your Housekeeping? N -  Some recent data might be hidden    Patient Care Team: [name], MD as PCP - General (Internal Medicine) [name], MD as Consulting Physician (Cardiology) [name], MD as Consulting Physician (General Surgery)   Assessment:   This is a routine wellness examination for [place]. Physical assessment deferred to PCP.   Exercise Activities and Dietary recommendations Current Exercise Habits: The patient does not participate in regular exercise at present Diet (meal preparation, eat out, water intake, caffeinated beverages, dairy products, fruits and vegetables): in general, a "healthy" diet  , well balanced   Reviewed heart healthy and diabetic diet. Encouraged patient to increase daily water and healthy fluid intake.  Goals    . Get back to walking as soon as I get my heart situated     I want to get back to mowing my yard asap    . Patient Stated     Stay as healthy and as independent as possible. Love family and enjoy life.       Fall Risk Fall Risk  10/12/2018 03/31/2018 09/16/2017 09/16/2017 03/16/2017  Falls in the past year? 0 No No No No  Injury with Fall? - - - - -    Depression Screen PHQ  2/9 Scores 10/12/2018 03/31/2018 09/16/2017 03/16/2017  PHQ - 2 Score 0 0 1 0  PHQ- 9 Score - - 1 -    Cognitive Function       Ad8 score reviewed for issues:  Issues making decisions: no  Less interest in hobbies / activities: no  Repeats questions, stories (family complaining): no  Trouble using ordinary gadgets (microwave, computer, phone):no  Forgets the month or year: no  Mismanaging finances: no  Remembering appts: no  Daily problems with thinking and/or  memory: no Ad8 score is= 0  Immunization History  Administered Date(s) Administered  . Influenza, High Dose Seasonal PF 09/08/2013, 07/22/2017, 08/04/2018  . Influenza,inj,Quad PF,6+ Mos 08/20/2014, 08/13/2015, 07/31/2016  . Pneumococcal Conjugate-13 09/10/2014  . Pneumococcal Polysaccharide-23 05/22/2016  . Tdap 04/03/2014    Screening Tests Health Maintenance  Topic Date Due  . FOOT EXAM  03/08/2019  . OPHTHALMOLOGY EXAM  03/22/2019  . TETANUS/TDAP  04/03/2024  . INFLUENZA VACCINE  Completed  . PNA vac Low Risk Adult  Completed      Plan:     Continue doing brain stimulating activities (puzzles, reading, adult coloring books, staying active) to keep memory sharp.   Continue to eat heart healthy diet (full of fruits, vegetables, whole grains, lean protein, water--limit salt, fat, and sugar intake) and increase physical activity as tolerated.  I have personally reviewed and noted the following in the patient's chart:   . Medical and social history . Use of alcohol, tobacco or illicit drugs  . Current medications and supplements . Functional ability and status . Nutritional status . Physical activity . Advanced directives . List of other physicians . Vitals . Screenings to include cognitive, depression, and falls . Referrals and appointments  In addition, I have reviewed and discussed with patient certain preventive protocols, quality metrics, and best practice recommendations. A written personalized care plan for preventive services as well as general preventive health recommendations were provided to patient.     Michiel Cowboy, RN  10/12/2018  Medical screening examination/treatment/procedure(s) were performed by non-physician practitioner and as supervising physician I was immediately available for consultation/collaboration. I agree with above. Cathlean Cower, MD

## 2018-10-12 NOTE — Patient Instructions (Signed)
Please continue all other medications as before, and refills have been done if requested.  Please have the pharmacy call with any other refills you may need.  Please continue your efforts at being more active, low cholesterol diet, and weight control.  Please keep your appointments with your specialists as you may have planned  Please go to the XRAY Department in the Basement (go straight as you get off the elevator) for the x-ray testing  Please go to the LAB in the Basement (turn left off the elevator) for the tests to be done today  You will be contacted by phone if any changes need to be made immediately.  Otherwise, you will receive a letter about your results with an explanation, but please check with MyChart first.  Please remember to sign up for MyChart if you have not done so, as this will be important to you in the future with finding out test results, communicating by private email, and scheduling acute appointments online when needed.  Please return in 6 months, or sooner if needed

## 2018-10-12 NOTE — Patient Instructions (Addendum)
Continue doing brain stimulating activities (puzzles, reading, adult coloring books, staying active) to keep memory sharp.   Continue to eat heart healthy diet (full of fruits, vegetables, whole grains, lean protein, water--limit salt, fat, and sugar intake) and increase physical activity as tolerated.   Thomas Marshall , Thank you for taking time to come for your Medicare Wellness Visit. I appreciate your ongoing commitment to your health goals. Please review the following plan we discussed and let me know if I can assist you in the future.   These are the goals we discussed: Goals    . Get back to walking as soon as I get my heart situated     I want to get back to mowing my yard asap    . Patient Stated     Stay as healthy and as independent as possible. Love family and enjoy life.       This is a list of the screening recommended for you and due dates:  Health Maintenance  Topic Date Due  . Complete foot exam   03/08/2019  . Eye exam for diabetics  03/22/2019  . Tetanus Vaccine  04/03/2024  . Flu Shot  Completed  . Pneumonia vaccines  Completed     Health Maintenance, Male A healthy lifestyle and preventive care is important for your health and wellness. Ask your health care provider about what schedule of regular examinations is right for you. What should I know about weight and diet? Eat a Healthy Diet  Eat plenty of vegetables, fruits, whole grains, low-fat dairy products, and lean protein.  Do not eat a lot of foods high in solid fats, added sugars, or salt.  Maintain a Healthy Weight Regular exercise can help you achieve or maintain a healthy weight. You should:  Do at least 150 minutes of exercise each week. The exercise should increase your heart rate and make you sweat (moderate-intensity exercise).  Do strength-training exercises at least twice a week.  Watch Your Levels of Cholesterol and Blood Lipids  Have your blood tested for lipids and cholesterol every 5  years starting at 79 years of age. If you are at high risk for heart disease, you should start having your blood tested when you are 79 years old. You may need to have your cholesterol levels checked more often if: ? Your lipid or cholesterol levels are high. ? You are older than 79 years of age. ? You are at high risk for heart disease.  What should I know about cancer screening? Many types of cancers can be detected early and may often be prevented. Lung Cancer  You should be screened every year for lung cancer if: ? You are a current smoker who has smoked for at least 30 years. ? You are a former smoker who has quit within the past 15 years.  Talk to your health care provider about your screening options, when you should start screening, and how often you should be screened.  Colorectal Cancer  Routine colorectal cancer screening usually begins at 79 years of age and should be repeated every 5-10 years until you are 79 years old. You may need to be screened more often if early forms of precancerous polyps or small growths are found. Your health care provider may recommend screening at an earlier age if you have risk factors for colon cancer.  Your health care provider may recommend using home test kits to check for hidden blood in the stool.  A small camera  at the end of a tube can be used to examine your colon (sigmoidoscopy or colonoscopy). This checks for the earliest forms of colorectal cancer.  Prostate and Testicular Cancer  Depending on your age and overall health, your health care provider may do certain tests to screen for prostate and testicular cancer.  Talk to your health care provider about any symptoms or concerns you have about testicular or prostate cancer.  Skin Cancer  Check your skin from head to toe regularly.  Tell your health care provider about any new moles or changes in moles, especially if: ? There is a change in a mole's size, shape, or color. ? You  have a mole that is larger than a pencil eraser.  Always use sunscreen. Apply sunscreen liberally and repeat throughout the day.  Protect yourself by wearing long sleeves, pants, a wide-brimmed hat, and sunglasses when outside.  What should I know about heart disease, diabetes, and high blood pressure?  If you are 79-90 years of age, have your blood pressure checked every 3-5 years. If you are 29 years of age or older, have your blood pressure checked every year. You should have your blood pressure measured twice-once when you are at a hospital or clinic, and once when you are not at a hospital or clinic. Record the average of the two measurements. To check your blood pressure when you are not at a hospital or clinic, you can use: ? An automated blood pressure machine at a pharmacy. ? A home blood pressure monitor.  Talk to your health care provider about your target blood pressure.  If you are between 69-104 years old, ask your health care provider if you should take aspirin to prevent heart disease.  Have regular diabetes screenings by checking your fasting blood sugar level. ? If you are at a normal weight and have a low risk for diabetes, have this test once every three years after the age of 45. ? If you are overweight and have a high risk for diabetes, consider being tested at a younger age or more often.  A one-time screening for abdominal aortic aneurysm (AAA) by ultrasound is recommended for men aged 81-75 years who are current or former smokers. What should I know about preventing infection? Hepatitis B If you have a higher risk for hepatitis B, you should be screened for this virus. Talk with your health care provider to find out if you are at risk for hepatitis B infection. Hepatitis C Blood testing is recommended for:  Everyone born from [birth year] through 1967.  Anyone with known risk factors for hepatitis C.  Sexually Transmitted Diseases (STDs)  You should be screened each  year for STDs including gonorrhea and chlamydia if: ? You are sexually active and are younger than 79 years of age. ? You are older than 79 years of age and your health care provider tells you that you are at risk for this type of infection. ? Your sexual activity has changed since you were last screened and you are at an increased risk for chlamydia or gonorrhea. Ask your health care provider if you are at risk.  Talk with your health care provider about whether you are at high risk of being infected with HIV. Your health care provider may recommend a prescription medicine to help prevent HIV infection.  What else can I do?  Schedule regular health, dental, and eye exams.  Stay current with your vaccines (immunizations).  Do not use any tobacco products,  such as cigarettes, chewing tobacco, and e-cigarettes. If you need help quitting, ask your health care provider.  Limit alcohol intake to no more than 2 drinks per day. One drink equals 12 ounces of beer, 5 ounces of wine, or 1 ounces of hard liquor.  Do not use street drugs.  Do not share needles.  Ask your health care provider for help if you need support or information about quitting drugs.  Tell your health care provider if you often feel depressed.  Tell your health care provider if you have ever been abused or do not feel safe at home. This information is not intended to replace advice given to you by your health care provider. Make sure you discuss any questions you have with your health care provider. Document Released: 05/14/2008 Document Revised: 07/15/2016 Document Reviewed: 08/20/2015 Elsevier Interactive Patient Education  [employer].

## 2018-10-12 NOTE — Assessment & Plan Note (Signed)
Subjective per pt, exam benign, for film today, consider CT if negative or has worsening symptoms/signs

## 2018-10-29 ENCOUNTER — Other Ambulatory Visit: Payer: Self-pay | Admitting: Internal Medicine

## 2018-10-29 DIAGNOSIS — N182 Chronic kidney disease, stage 2 (mild): Principal | ICD-10-CM

## 2018-10-29 DIAGNOSIS — E1122 Type 2 diabetes mellitus with diabetic chronic kidney disease: Secondary | ICD-10-CM

## 2018-11-09 DIAGNOSIS — L821 Other seborrheic keratosis: Secondary | ICD-10-CM | POA: Diagnosis not present

## 2018-11-09 DIAGNOSIS — L820 Inflamed seborrheic keratosis: Secondary | ICD-10-CM | POA: Diagnosis not present

## 2018-11-09 DIAGNOSIS — L570 Actinic keratosis: Secondary | ICD-10-CM | POA: Diagnosis not present

## 2018-11-17 ENCOUNTER — Encounter: Payer: Self-pay | Admitting: Family

## 2018-11-17 ENCOUNTER — Ambulatory Visit (HOSPITAL_COMMUNITY)
Admission: RE | Admit: 2018-11-17 | Discharge: 2018-11-17 | Disposition: A | Discharge status: Home / Self Care | Payer: Medicare Other | Source: Ambulatory Visit | Attending: Family | Admitting: Family

## 2018-11-17 ENCOUNTER — Ambulatory Visit (INDEPENDENT_AMBULATORY_CARE_PROVIDER_SITE_OTHER): Payer: Medicare Other | Admitting: Family

## 2018-11-17 VITALS — BP 180/77 | HR 62 | Temp 96.7°F | Resp 12 | Ht 74.0 in | Wt 215.0 lb

## 2018-11-17 DIAGNOSIS — Z9889 Other specified postprocedural states: Secondary | ICD-10-CM

## 2018-11-17 DIAGNOSIS — I714 Abdominal aortic aneurysm, without rupture, unspecified: Secondary | ICD-10-CM

## 2018-11-17 DIAGNOSIS — I6523 Occlusion and stenosis of bilateral carotid arteries: Secondary | ICD-10-CM

## 2018-11-17 NOTE — Patient Instructions (Signed)

## 2018-11-17 NOTE — Progress Notes (Signed)
Chief Complaint: Follow up Extracranial Carotid Artery Stenosis   History of Present Illness  Thomas Marshall is a 77 y.o. male who is s/p R CEA on 12/10/15 by Dr. Bridgett Larsson. His plaque was found to be necrotic with near occlusion of the lumen. Previous carotid studies demonstrated: OYDX>41%OINOMVEH, LICA 20-94% stenosis.  We are also monitoring a small AAA.  Pt denies any back pain or abdominal pain.   The patient denies anyhistory of TIA or stroke symptoms. Specifically he denies any symptoms of amaurosis fugax or monocular blindness, unilateral facial drooping, hemiplegia/hemiparesis, or receptive or expressive aphasia.   He had a cardiac stent placed by Dr. Johnsie Cancel in October 2018, pt was having sx's of fatigue. He also had a cardiac stent placed in June 2018.  He had a CABG in 2004, has never had an MI.  Pt had an ablation in November 2018 by [name] for symptomatic PVC's, states he feels better since this.   Dr. Bridgett Larsson last evaluated pt on 10-30-16. At that time pt had and asymptomatic L ICA stenosis 40-59% and3 cm AAA. Dr. Bridgett Larsson advised that pt follow-up with carotid duplex andAAA duplex in 1 year.  Pt states his blood pressure at home runs about 140/70.   He denies claudication symptoms with walking.   Diabetic: Yes, A1C was 6.5 in November 2019 Tobacco use: former smoker, quit in 1972, started at age 81 years  Pt meds include: Statin :Yes Betablocker: yes ASA: no Other anticoagulants/antiplatelets: Eliquis,  hx of cardiac stents and PAF    Past Medical History:  Diagnosis Date  . AAA (abdominal aortic aneurysm) ([place])   . Arthritis   . Cancer (HCC)    skin & squamous cell  . Carotid artery disease ([place])    a. s/p R CEA 12/2015.  [name] CKD (chronic kidney disease), stage III ([place])    a. per historical labs.  . Coronary artery disease    a. s/p CABG 2004.  . Diabetes mellitus without complication ([place])   . GERD (gastroesophageal reflux disease)   .  Hyperlipidemia   . Hypertension   . Medication intolerance Multiple   . PAF (paroxysmal atrial fibrillation) ([place])   . Pneumonia   . Sinus bradycardia    a. baseline HR 50s-60s, also h/o bradycardia on metoprolol and carvedilol    Social History Social History   Tobacco Use  . Smoking status: Former Smoker    Packs/day: 1.00    Years: 13.00    Pack years: 13.00    Last attempt to quit: 11/30/1970    Years since quitting: 47.9  . Smokeless tobacco: Never Used  Substance Use Topics  . Alcohol use: No    Alcohol/week: 0.0 standard drinks  . Drug use: No    Family History Family History  Problem Relation Age of Onset  . Stroke Mother   . Heart disease Mother   . Heart disease Father   . Hypertension Father   . Hyperlipidemia Father   . Cancer Sister        breast  and skin  . Diabetes Sister   . Hypertension Sister   . Diabetes Brother   . Heart disease Brother   . Hyperlipidemia Brother   . Hypertension Brother   . Early death Neg Hx     Surgical History Past Surgical History:  Procedure Laterality Date  . ANKLE SURGERY     right fused  . BACK SURGERY     low back   .  CHOLECYSTECTOMY N/A 12/07/2013   Procedure: LAPAROSCOPIC CHOLECYSTECTOMY ;  Surgeon: [name], MD;  Location: [facility];  Service: General;  Laterality: N/A;  . COLONOSCOPY    . CORONARY ARTERY BYPASS GRAFT  2004   [place]  . CORONARY STENT INTERVENTION N/A 05/21/2017   Procedure: Coronary Stent Intervention;  Surgeon: [name], MD;  Location: [facility];  Service: Cardiovascular;  Laterality: N/A;  . ENDARTERECTOMY Right 12/10/2015   Procedure: Right Carotid ENDARTERECTOMY;  Surgeon: Conrad Stapleton, MD;  Location: [facility];  Service: Vascular;  Laterality: Right;  . EXPLORATION POST OPERATIVE OPEN HEART    . FINGER SURGERY     skin graft on rt index  . FINGER SURGERY Right    right index finger.  [name] HERNIA REPAIR Right    RIH  . JOINT REPLACEMENT    . KNEE SURGERY      replacement on both knees  . LEFT HEART CATH AND CORS/GRAFTS ANGIOGRAPHY N/A 05/21/2017   Procedure: Left Heart Cath and Cors/Grafts Angiography;  Surgeon: [name], MD;  Location: [facility];  Service: Cardiovascular;  Laterality: N/A;  . PROSTATE SURGERY    . PVC ABLATION N/A 10/27/2017   Procedure: PVC ABLATION;  Surgeon: Evans Lance, MD;  Location: [facility];  Service: Cardiovascular;  Laterality: N/A;  . TONSILLECTOMY    . URINARY SURGERY     scar tissue    Allergies  Allergen Reactions  . Lisinopril Swelling and Other (See Comments)    Angioedema.   . Losartan Swelling and Other (See Comments)    Lips swell Angioedema   . Nifedipine Swelling and Other (See Comments)    Sugar increase  . Triamterene Swelling  . Hydralazine Hcl Other (See Comments)    Fatigue; poor appetite  . Amoxicillin Rash and Other (See Comments)    Has patient had a PCN reaction causing immediate rash, facial/tongue/throat swelling, SOB or lightheadedness with hypotension:No Has patient had a PCN reaction causing severe rash involving mucus membranes or skin necrosis:No Has patient had a PCN reaction that required hospitalization:No Has patient had a PCN reaction occurring within the last 10 years: No If all of the above answers are "NO", then may proceed with Cephalosporin use.   . Ampicillin Rash and Other (See Comments)    Has patient had a PCN reaction causing immediate rash, facial/tongue/throat swelling, SOB or lightheadedness with hypotension:No Has patient had a PCN reaction causing severe rash involving mucus membranes or skin necrosis:No Has patient had a PCN reaction that required hospitalization:No Has patient had a PCN reaction occurring within the last 10 years: No If all of the above answers are "NO", then may proceed with Cephalosporin use.    . Carvedilol Other (See Comments)    Low heart rate     Current Outpatient Medications  Medication Sig Dispense  Refill  . amLODipine (NORVASC) 10 MG tablet Take 1 tablet (10 mg total) by mouth daily. 30 tablet 11  . apixaban (ELIQUIS) 5 MG TABS tablet Take 5 mg by mouth 2 (two) times daily. (0800 & 2000)    . atorvastatin (LIPITOR) 40 MG tablet Take 40 mg by mouth at bedtime.     . cholecalciferol (VITAMIN D) 1000 UNITS tablet Take 1,000 Units by mouth every evening.     . cloNIDine (CATAPRES) 0.1 MG tablet Take 1 tablet (0.1 mg total) by mouth 2 (two) times daily. 180 tablet 2  . dofetilide (TIKOSYN) 250 MCG capsule Take  1 capsule (250 mcg total) by mouth 2 (two) times daily. 60 capsule 6  . glucose blood test strip Accu-Check Guide Strips. Dx E11.9 Use to test blood glucose level 2 times a day. 50 each 11  . magnesium oxide (MAG-OX) 400 MG tablet Take 1 tablet (400 mg total) by mouth 4 (four) times daily. (Patient taking differently: Take 800 mg by mouth 2 (two) times daily. ) 360 tablet 3  . metFORMIN (GLUCOPHAGE) 1000 MG tablet TAKE 1 TABLET BY MOUTH TWICE DAILY WITH  A MEAL 180 tablet 3  . nitroGLYCERIN (NITROSTAT) 0.4 MG SL tablet Place 1 tablet (0.4 mg total) under the tongue every 5 (five) minutes as needed for chest pain (x 3 doses). 30 tablet 3  . propranolol (INDERAL) 20 MG tablet Take one tablet by mouth PRN for irregular heartbeat.  Maximum 2 tablets in 24 hours. 30 tablet 3  . terazosin (HYTRIN) 1 MG capsule Take 1 mg by mouth at bedtime. (2100) 30 capsule 11  . vitamin C (ASCORBIC ACID) 500 MG tablet Take 500 mg by mouth daily.     . pantoprazole (PROTONIX) 40 MG tablet Take 1 tablet (40 mg total) by mouth daily. 90 tablet 3   No current facility-administered medications for this visit.     Review of Systems : See HPI for pertinent positives and negatives.  Physical Examination  Vitals:   11/17/18 1141  BP: (!) 180/77  Pulse: 62  Resp: 12  Temp: (!) 96.7 F (35.9 C)  SpO2: 99%  Weight: 215 lb (97.5 kg)  Height: 6\' 2"  (1.88 m)   Body mass index is 27.6 kg/m.  General: WDWN  male in NAD GAIT: normal Eyes: PERRLA HENT: No gross abnormalities.  Pulmonary:  Respirations are non-labored, good air movement in all fields, CTAB, no rales, rhonchi, or wheezes. Cardiac: regular rhythm, no detected murmur.  VASCULAR EXAM Carotid Bruits Right Left   Negative positive     Abdominal aortic pulse is not palpable. Radial pulses are 2+ palpable and equal.                                                                                                                            LE Pulses Right Left       FEMORAL  3+ palpable  3+ palpable        POPLITEAL  2+ palpable   2+ palpable       POSTERIOR TIBIAL  2+ palpable   2+ palpable        DORSALIS PEDIS      ANTERIOR TIBIAL 2+ palpable  not palpable     Gastrointestinal: soft, nontender, BS WNL, no r/g, no palpable masses. Musculoskeletal: no muscle atrophy/wasting. M/S 5/5 throughout, extremities without ischemic changes. Skin: No rashes, no ulcers, no cellulitis.   Neurologic:  A&O X 3; appropriate affect, sensation is normal; speech is normal, CN 2-12 intact, pain and light touch intact in extremities, motor exam as listed  above. Psychiatric: Normal thought content, mood appropriate to clinical situation.    Assessment: JOAOVICTOR KRONE is a 77 y.o. male who who is s/p R CEA on 12/10/15. We are also monitoring a small AAA which remains small at 3 cm.  He has no history of stroke or TIA.    His atherosclerotic risk factors include former smoker and currently well controlled DM.  He takes a daily statin. He takes Eliquis for for PAF.   DATA  Carotid Duplex (11-17-18): Right Carotid: Patent right carotid endarterectomy site with no evidence for         restenosis    Left Carotid: Velocities in the left ICA are consistent with a 40-59% stenosis.        The ECA appears >50% stenosed.  Bilateral vertebral artery flow is antegrade.  Bilateral subclavian artery waveforms are normal.   No  significant change compared to the exam on 11-05-17.    AAA Duplex (11/05/17): Infrarenal dilatation measuring 3.0 cm; Right ICA: 1.5 cm; Left ICA: 1.3 cm Previous (01-24-16): 3.0 cm No change since 01-24-16.   3.0 cm AAA on 02-02-18 duplex   PLAN:    Based on today's exam and non-invasive vascular lab results, the patient will follow up in 1 year with carotid duplex; 2 years for AAA duplex.   I discussed in depth with the patient the nature of atherosclerosis, and emphasized the importance of maximal medical management including strict control of blood pressure, blood glucose, and lipid levels, obtaining regular exercise, and continued cessation of smoking.  The patient is aware that without maximal medical management the underlying atherosclerotic disease process will progress, limiting the benefit of any interventions. The patient was given information about stroke prevention and what symptoms should prompt the patient to seek immediate medical care. Thank you for allowing [place] to participate in this patient's care.  Thomas Chambers, RN, MSN, FNP-C [facility] Office: [phone]  Clinic Physician: Oneida Alar  11/17/18 12:08 PM

## 2018-12-29 ENCOUNTER — Ambulatory Visit: Payer: Medicare Other | Admitting: Podiatry

## 2019-01-23 ENCOUNTER — Ambulatory Visit (INDEPENDENT_AMBULATORY_CARE_PROVIDER_SITE_OTHER): Payer: Medicare Other | Admitting: Podiatry

## 2019-01-23 DIAGNOSIS — B351 Tinea unguium: Secondary | ICD-10-CM | POA: Diagnosis not present

## 2019-01-23 DIAGNOSIS — E1140 Type 2 diabetes mellitus with diabetic neuropathy, unspecified: Secondary | ICD-10-CM

## 2019-01-23 DIAGNOSIS — Q828 Other specified congenital malformations of skin: Secondary | ICD-10-CM

## 2019-01-23 DIAGNOSIS — M79675 Pain in left toe(s): Secondary | ICD-10-CM | POA: Diagnosis not present

## 2019-01-23 DIAGNOSIS — M79674 Pain in right toe(s): Secondary | ICD-10-CM

## 2019-01-23 DIAGNOSIS — Z7901 Long term (current) use of anticoagulants: Secondary | ICD-10-CM | POA: Diagnosis not present

## 2019-01-25 NOTE — Progress Notes (Signed)
Subjective: 78 year old male presents the office today for concerns of his right big toenail becoming loose.  His wife did trim the toenails for him.  He did notice some blood underneath the toenail and the nail started become loose and his wife noticed this.  Denies any pain in the nail he denies any redness or drainage or any swelling.  Also the callus on the right foot that is painful at times.  He can denies any redness or drainage.  The nails are elongated still and thickening causing occasional discomfort. Denies any systemic complaints such as fevers, chills, nausea, vomiting. No acute changes since last appointment, and no other complaints at this time.   Objective: AAO x3, NAD DP/PT pulses palpable bilaterally, CRT less than 3 seconds The right hallux toenail is loose from the underlying nail bed only.  On the very proximal aspect.  There is some dried blood underneath the toenail.  Is able to debride the majority the toenail there is no underlying ulceration drainage or any signs of infection.  The remaining toenails are also hypertrophic, dystrophic with yellow-brown discoloration.  Subjectively there is tenderness of nails 1-5 bilaterally.  Hyperkeratotic lesion on the right foot submetatarsal 5 as well as fifth metatarsal base without any underlying ulceration drainage or any signs of infection.  No open lesions or pre-ulcerative lesions.  No pain with calf compression, swelling, warmth, erythema  Assessment: Symptomatic onychomycosis with right hallux onycholysis; hyperkeratotic lesions  Plan: -All treatment options discussed with the patient including all alternatives, risks, complications.  -I debrided the nails x10 without any complications or bleeding.  Particularly the right hallux toenail I was able to bring the majority toenail without any problems. -Debrided the hyperkeratotic lesions without any complications or bleeding. -I recommend not try to trim the toenails himself or his  wife. -Patient encouraged to call the office with any questions, concerns, change in symptoms.   Thomas Marshall DPM

## 2019-01-31 ENCOUNTER — Ambulatory Visit (INDEPENDENT_AMBULATORY_CARE_PROVIDER_SITE_OTHER): Payer: Medicare Other | Admitting: Internal Medicine

## 2019-01-31 ENCOUNTER — Encounter: Payer: Self-pay | Admitting: Internal Medicine

## 2019-01-31 VITALS — BP 136/66 | HR 78 | Temp 97.6°F | Ht 74.0 in | Wt 217.0 lb

## 2019-01-31 DIAGNOSIS — R059 Cough, unspecified: Secondary | ICD-10-CM

## 2019-01-31 DIAGNOSIS — E1122 Type 2 diabetes mellitus with diabetic chronic kidney disease: Secondary | ICD-10-CM | POA: Diagnosis not present

## 2019-01-31 DIAGNOSIS — N182 Chronic kidney disease, stage 2 (mild): Secondary | ICD-10-CM | POA: Diagnosis not present

## 2019-01-31 DIAGNOSIS — R05 Cough: Secondary | ICD-10-CM

## 2019-01-31 DIAGNOSIS — I10 Essential (primary) hypertension: Secondary | ICD-10-CM

## 2019-01-31 MED ORDER — LEVOFLOXACIN 500 MG PO TABS: 500.0000 mg | ORAL_TABLET | Freq: Every day | ORAL | 0 refills | Status: AC

## 2019-01-31 MED ORDER — HYDROCODONE-HOMATROPINE 5-1.5 MG/5ML PO SYRP: 5.0000 mL | ORAL_SOLUTION | Freq: Four times a day (QID) | ORAL | 0 refills | Status: AC | PRN

## 2019-01-31 NOTE — Assessment & Plan Note (Signed)
Mild to mod, c/w bronchitis vs pna, declines cxr, for antibx course, cough med prn,  to f/u any worsening symptoms or concerns 

## 2019-01-31 NOTE — Patient Instructions (Addendum)
Please take all new medication as prescribed - the antibiotic, and cough medicine as needed  Please continue all other medications as before, and refills have been done if requested.  Please have the pharmacy call with any other refills you may need.  Please keep your appointments with your specialists as you may have planned   

## 2019-01-31 NOTE — Progress Notes (Signed)
Subjective:    Patient ID: Thomas Marshall, male    DOB: [birth year], 78 y.o.   MRN: [id number]  HPI  Here with acute onset mild to mod 2-3 days ST, HA, general weakness and malaise, with prod cough greenish sputum, but Pt denies chest pain, increased sob or doe, wheezing, orthopnea, PND, increased LE swelling, palpitations, dizziness or syncope.  Pt denies new neurological symptoms such as new headache, or facial or extremity weakness or numbness   Pt denies polydipsia, polyuria, Past Medical History:  Diagnosis Date  . AAA (abdominal aortic aneurysm) ([place])   . Arthritis   . Cancer (HCC)    skin & squamous cell  . Carotid artery disease ([place])    a. s/p R CEA 12/2015.  [name] CKD (chronic kidney disease), stage III ([place])    a. per historical labs.  . Coronary artery disease    a. s/p CABG 2004.  . Diabetes mellitus without complication ([place])   . GERD (gastroesophageal reflux disease)   . Hyperlipidemia   . Hypertension   . Medication intolerance Multiple   . PAF (paroxysmal atrial fibrillation) ([place])   . Pneumonia   . Sinus bradycardia    a. baseline HR 50s-60s, also h/o bradycardia on metoprolol and carvedilol   Past Surgical History:  Procedure Laterality Date  . ANKLE SURGERY     right fused  . BACK SURGERY     low back   . CHOLECYSTECTOMY N/A 12/07/2013   Procedure: LAPAROSCOPIC CHOLECYSTECTOMY ;  Surgeon: [name], MD;  Location: [facility];  Service: General;  Laterality: N/A;  . COLONOSCOPY    . CORONARY ARTERY BYPASS GRAFT  2004   [place]  . CORONARY STENT INTERVENTION N/A 05/21/2017   Procedure: Coronary Stent Intervention;  Surgeon: [name], MD;  Location: [facility];  Service: Cardiovascular;  Laterality: N/A;  . ENDARTERECTOMY Right 12/10/2015   Procedure: Right Carotid ENDARTERECTOMY;  Surgeon: Conrad [place], MD;  Location: [facility];  Service: Vascular;  Laterality: Right;  . EXPLORATION POST OPERATIVE OPEN HEART    . FINGER SURGERY     skin graft on rt  index  . FINGER SURGERY Right    right index finger.  [name] HERNIA REPAIR Right    RIH  . JOINT REPLACEMENT    . KNEE SURGERY     replacement on both knees  . LEFT HEART CATH AND CORS/GRAFTS ANGIOGRAPHY N/A 05/21/2017   Procedure: Left Heart Cath and Cors/Grafts Angiography;  Surgeon: [name], MD;  Location: [facility];  Service: Cardiovascular;  Laterality: N/A;  . PROSTATE SURGERY    . PVC ABLATION N/A 10/27/2017   Procedure: PVC ABLATION;  Surgeon: Evans Lance, MD;  Location: [facility];  Service: Cardiovascular;  Laterality: N/A;  . TONSILLECTOMY    . URINARY SURGERY     scar tissue    reports that he quit smoking about 48 years ago. He has a 13.00 pack-year smoking history. He has never used smokeless tobacco. He reports that he does not drink alcohol or use drugs. family history includes Cancer in his sister; Diabetes in his brother and sister; Heart disease in his brother, father, and mother; Hyperlipidemia in his brother and father; Hypertension in his brother, father, and sister; Stroke in his mother. Allergies  Allergen Reactions  . Lisinopril Swelling and Other (See Comments)    Angioedema.   . Losartan Swelling and Other (See Comments)    Lips swell Angioedema   .  Nifedipine Swelling and Other (See Comments)    Sugar increase  . Triamterene Swelling  . Hydralazine Hcl Other (See Comments)    Fatigue; poor appetite  . Amoxicillin Rash and Other (See Comments)    Has patient had a PCN reaction causing immediate rash, facial/tongue/throat swelling, SOB or lightheadedness with hypotension:No Has patient had a PCN reaction causing severe rash involving mucus membranes or skin necrosis:No Has patient had a PCN reaction that required hospitalization:No Has patient had a PCN reaction occurring within the last 10 years: No If all of the above answers are "NO", then may proceed with Cephalosporin use.   . Ampicillin Rash and Other (See Comments)    Has  patient had a PCN reaction causing immediate rash, facial/tongue/throat swelling, SOB or lightheadedness with hypotension:No Has patient had a PCN reaction causing severe rash involving mucus membranes or skin necrosis:No Has patient had a PCN reaction that required hospitalization:No Has patient had a PCN reaction occurring within the last 10 years: No If all of the above answers are "NO", then may proceed with Cephalosporin use.    . Carvedilol Other (See Comments)    Low heart rate    Current Outpatient Medications on File Prior to Visit  Medication Sig Dispense Refill  . amLODipine (NORVASC) 10 MG tablet Take 1 tablet (10 mg total) by mouth daily. 30 tablet 11  . apixaban (ELIQUIS) 5 MG TABS tablet Take 5 mg by mouth 2 (two) times daily. (0800 & 2000)    . atorvastatin (LIPITOR) 40 MG tablet Take 40 mg by mouth at bedtime.     . cholecalciferol (VITAMIN D) 1000 UNITS tablet Take 1,000 Units by mouth every evening.     . cloNIDine (CATAPRES) 0.1 MG tablet Take 1 tablet (0.1 mg total) by mouth 2 (two) times daily. 180 tablet 2  . dofetilide (TIKOSYN) 250 MCG capsule Take 1 capsule (250 mcg total) by mouth 2 (two) times daily. 60 capsule 6  . glucose blood test strip Accu-Check Guide Strips. Dx E11.9 Use to test blood glucose level 2 times a day. 50 each 11  . magnesium oxide (MAG-OX) 400 MG tablet Take 1 tablet (400 mg total) by mouth 4 (four) times daily. (Patient taking differently: Take 800 mg by mouth 2 (two) times daily. ) 360 tablet 3  . metFORMIN (GLUCOPHAGE) 1000 MG tablet TAKE 1 TABLET BY MOUTH TWICE DAILY WITH  A MEAL 180 tablet 3  . nitroGLYCERIN (NITROSTAT) 0.4 MG SL tablet Place 1 tablet (0.4 mg total) under the tongue every 5 (five) minutes as needed for chest pain (x 3 doses). 30 tablet 3  . propranolol (INDERAL) 20 MG tablet Take one tablet by mouth PRN for irregular heartbeat.  Maximum 2 tablets in 24 hours. 30 tablet 3  . terazosin (HYTRIN) 1 MG capsule Take 1 mg by mouth  at bedtime. (2100) 30 capsule 11  . vitamin C (ASCORBIC ACID) 500 MG tablet Take 500 mg by mouth daily.     . pantoprazole (PROTONIX) 40 MG tablet Take 1 tablet (40 mg total) by mouth daily. 90 tablet 3   No current facility-administered medications on file prior to visit.    Review of Systems  Constitutional: Negative for other unusual diaphoresis or sweats HENT: Negative for ear discharge or swelling Eyes: Negative for other worsening visual disturbances Respiratory: Negative for stridor or other swelling  Gastrointestinal: Negative for worsening distension or other blood Genitourinary: Negative for retention or other urinary change Musculoskeletal: Negative for other MSK  pain or swelling Skin: Negative for color change or other new lesions Neurological: Negative for worsening tremors and other numbness  Psychiatric/Behavioral: Negative for worsening agitation or other fatigue All other system neg per pt    Objective:   Physical Exam BP 136/66   Pulse 78   Temp 97.6 F (36.4 C) (Oral)   Ht 6\' 2"  (1.88 m)   Wt 217 lb (98.4 kg)   SpO2 96%   BMI 27.86 kg/m  VS noted, mild ill Constitutional: Pt appears in NAD HENT: Head: NCAT.  Right Ear: External ear normal.  Left Ear: External ear normal.  Eyes: . Pupils are equal, round, and reactive to light. Conjunctivae and EOM are normal Bilat tm's with mild erythema.  Max sinus areas non tender.  Pharynx with mild erythema, no exudate Nose: without d/c or deformity Neck: Neck supple. Gross normal ROM Cardiovascular: Normal rate and regular rhythm.   Pulmonary/Chest: Effort normal and breath sounds decreased without rales or wheezing.  Abd:  Soft, NT, ND, + BS, no organomegaly Neurological: Pt is alert. At baseline orientation, motor grossly intact Skin: Skin is warm. No rashes, other new lesions, no LE edema Psychiatric: Pt behavior is normal without agitation  No other exam findings Lab Results  Component Value Date   WBC 5.9  03/31/2018   HGB 11.9 (L) 03/31/2018   HCT 36.3 (L) 03/31/2018   PLT 129.0 (L) 03/31/2018   GLUCOSE 162 (H) 10/12/2018   CHOL 116 10/12/2018   TRIG 163.0 (H) 10/12/2018   HDL 39.00 (L) 10/12/2018   LDLDIRECT 67.0 09/16/2016   LDLCALC 44 10/12/2018   ALT 10 10/12/2018   AST 14 10/12/2018   NA 142 10/12/2018   K 3.8 10/12/2018   CL 107 10/12/2018   CREATININE 1.66 (H) 10/12/2018   BUN 28 (H) 10/12/2018   CO2 29 10/12/2018   TSH 2.50 03/31/2018   PSA 1.09 03/31/2018   INR 1.19 09/04/2017   HGBA1C 6.5 10/12/2018   MICROALBUR 5.1 (H) 03/31/2018       Assessment & Plan:

## 2019-01-31 NOTE — Assessment & Plan Note (Signed)
stable overall by history and exam, recent data reviewed with pt, and pt to continue medical treatment as before,  to f/u any worsening symptoms or concerns  

## 2019-03-21 DIAGNOSIS — L570 Actinic keratosis: Secondary | ICD-10-CM | POA: Diagnosis not present

## 2019-04-06 ENCOUNTER — Other Ambulatory Visit: Payer: Self-pay | Admitting: Cardiovascular Disease

## 2019-04-18 ENCOUNTER — Other Ambulatory Visit: Payer: Self-pay

## 2019-04-18 ENCOUNTER — Other Ambulatory Visit (INDEPENDENT_AMBULATORY_CARE_PROVIDER_SITE_OTHER): Payer: Medicare Other

## 2019-04-18 ENCOUNTER — Encounter: Payer: Self-pay | Admitting: Internal Medicine

## 2019-04-18 ENCOUNTER — Ambulatory Visit (INDEPENDENT_AMBULATORY_CARE_PROVIDER_SITE_OTHER): Payer: Medicare Other | Admitting: Internal Medicine

## 2019-04-18 VITALS — BP 140/82 | HR 71 | Temp 97.9°F | Ht 74.0 in | Wt 208.0 lb

## 2019-04-18 DIAGNOSIS — N182 Chronic kidney disease, stage 2 (mild): Secondary | ICD-10-CM

## 2019-04-18 DIAGNOSIS — I10 Essential (primary) hypertension: Secondary | ICD-10-CM

## 2019-04-18 DIAGNOSIS — E7800 Pure hypercholesterolemia, unspecified: Secondary | ICD-10-CM

## 2019-04-18 DIAGNOSIS — N183 Chronic kidney disease, stage 3 unspecified: Secondary | ICD-10-CM

## 2019-04-18 DIAGNOSIS — E1122 Type 2 diabetes mellitus with diabetic chronic kidney disease: Secondary | ICD-10-CM

## 2019-04-18 LAB — HEPATIC FUNCTION PANEL
ALT: 12 U/L (ref 0–53)
AST: 19 U/L (ref 0–37)
Albumin: 4.4 g/dL (ref 3.5–5.2)
Alkaline Phosphatase: 74 U/L (ref 39–117)
Bilirubin, Direct: 0.2 mg/dL (ref 0.0–0.3)
Total Bilirubin: 0.8 mg/dL (ref 0.2–1.2)
Total Protein: 7.2 g/dL (ref 6.0–8.3)

## 2019-04-18 LAB — BASIC METABOLIC PANEL
BUN: 45 mg/dL — ABNORMAL HIGH (ref 6–23)
CO2: 27 mEq/L (ref 19–32)
Calcium: 9.1 mg/dL (ref 8.4–10.5)
Chloride: 104 mEq/L (ref 96–112)
Creatinine, Ser: 2.26 mg/dL — ABNORMAL HIGH (ref 0.40–1.50)
GFR: 28.07 mL/min — ABNORMAL LOW (ref >60.00)
Glucose, Bld: 114 mg/dL — ABNORMAL HIGH (ref 70–99)
Potassium: 4.9 mEq/L (ref 3.5–5.1)
Sodium: 138 mEq/L (ref 135–145)

## 2019-04-18 LAB — LIPID PANEL
Cholesterol: 108 mg/dL (ref 0–200)
HDL: 38.7 mg/dL — ABNORMAL LOW (ref >39.00)
LDL Cholesterol: 49 mg/dL (ref 0–99)
NonHDL: 69.46
Total CHOL/HDL Ratio: 3
Triglycerides: 104 mg/dL (ref 0.0–149.0)
VLDL: 20.8 mg/dL (ref 0.0–40.0)

## 2019-04-18 LAB — HEMOGLOBIN A1C: Hgb A1c MFr Bld: 6.8 % — ABNORMAL HIGH (ref 4.6–6.5)

## 2019-04-18 NOTE — Patient Instructions (Signed)

## 2019-04-18 NOTE — Progress Notes (Signed)
Subjective:    Patient ID: Thomas Marshall, male    DOB: [birth year], 78 y.o.   MRN: [id number]  HPI  Here to f/u; overall doing ok,  Pt denies chest pain, increasing sob or doe, wheezing, orthopnea, PND, increased LE swelling, palpitations, dizziness or syncope.  Pt denies new neurological symptoms such as new headache, or facial or extremity weakness or numbness.  Pt denies polydipsia, polyuria, or low sugar episode.  Pt states overall good compliance with meds, mostly trying to follow appropriate diet, with wt overall stable,  but little exercise however. No new complaints Past Medical History:  Diagnosis Date  . AAA (abdominal aortic aneurysm) ([place])   . Arthritis   . Cancer (HCC)    skin & squamous cell  . Carotid artery disease ([place])    a. s/p R CEA 12/2015.  [name] CKD (chronic kidney disease), stage III ([place])    a. per historical labs.  . Coronary artery disease    a. s/p CABG 2004.  . Diabetes mellitus without complication ([place])   . GERD (gastroesophageal reflux disease)   . Hyperlipidemia   . Hypertension   . Medication intolerance Multiple   . PAF (paroxysmal atrial fibrillation) ([place])   . Pneumonia   . Sinus bradycardia    a. baseline HR 50s-60s, also h/o bradycardia on metoprolol and carvedilol   Past Surgical History:  Procedure Laterality Date  . ANKLE SURGERY     right fused  . BACK SURGERY     low back   . CHOLECYSTECTOMY N/A 12/07/2013   Procedure: LAPAROSCOPIC CHOLECYSTECTOMY ;  Surgeon: [name], MD;  Location: [facility];  Service: General;  Laterality: N/A;  . COLONOSCOPY    . CORONARY ARTERY BYPASS GRAFT  2004   [place]  . CORONARY STENT INTERVENTION N/A 05/21/2017   Procedure: Coronary Stent Intervention;  Surgeon: [name], MD;  Location: [facility];  Service: Cardiovascular;  Laterality: N/A;  . ENDARTERECTOMY Right 12/10/2015   Procedure: Right Carotid ENDARTERECTOMY;  Surgeon: Conrad Dozier, MD;  Location: [facility];  Service: Vascular;   Laterality: Right;  . EXPLORATION POST OPERATIVE OPEN HEART    . FINGER SURGERY     skin graft on rt index  . FINGER SURGERY Right    right index finger.  [name] HERNIA REPAIR Right    [facility]  . JOINT REPLACEMENT    . KNEE SURGERY     replacement on both knees  . LEFT HEART CATH AND CORS/GRAFTS ANGIOGRAPHY N/A 05/21/2017   Procedure: Left Heart Cath and Cors/Grafts Angiography;  Surgeon: [name], MD;  Location: [facility];  Service: Cardiovascular;  Laterality: N/A;  . PROSTATE SURGERY    . PVC ABLATION N/A 10/27/2017   Procedure: PVC ABLATION;  Surgeon: Evans Lance, MD;  Location: [facility];  Service: Cardiovascular;  Laterality: N/A;  . TONSILLECTOMY    . URINARY SURGERY     scar tissue    reports that he quit smoking about 48 years ago. He has a 13.00 pack-year smoking history. He has never used smokeless tobacco. He reports that he does not drink alcohol or use drugs. family history includes Cancer in his sister; Diabetes in his brother and sister; Heart disease in his brother, father, and mother; Hyperlipidemia in his brother and father; Hypertension in his brother, father, and sister; Stroke in his mother. Allergies  Allergen Reactions  . Lisinopril Swelling and Other (See Comments)    Angioedema.   [name]  Losartan Swelling and Other (See Comments)    Lips swell Angioedema   . Nifedipine Swelling and Other (See Comments)    Sugar increase  . Triamterene Swelling  . Hydralazine Hcl Other (See Comments)    Fatigue; poor appetite  . Amoxicillin Rash and Other (See Comments)    Has patient had a PCN reaction causing immediate rash, facial/tongue/throat swelling, SOB or lightheadedness with hypotension:No Has patient had a PCN reaction causing severe rash involving mucus membranes or skin necrosis:No Has patient had a PCN reaction that required hospitalization:No Has patient had a PCN reaction occurring within the last 10 years: No If all of the above answers  are "NO", then may proceed with Cephalosporin use.   . Ampicillin Rash and Other (See Comments)    Has patient had a PCN reaction causing immediate rash, facial/tongue/throat swelling, SOB or lightheadedness with hypotension:No Has patient had a PCN reaction causing severe rash involving mucus membranes or skin necrosis:No Has patient had a PCN reaction that required hospitalization:No Has patient had a PCN reaction occurring within the last 10 years: No If all of the above answers are "NO", then may proceed with Cephalosporin use.    . Carvedilol Other (See Comments)    Low heart rate    Current Outpatient Medications on File Prior to Visit  Medication Sig Dispense Refill  . amLODipine (NORVASC) 10 MG tablet Take 1 tablet (10 mg total) by mouth daily. 30 tablet 11  . apixaban (ELIQUIS) 5 MG TABS tablet Take 5 mg by mouth 2 (two) times daily. (0800 & 2000)    . atorvastatin (LIPITOR) 40 MG tablet Take 40 mg by mouth at bedtime.     . cholecalciferol (VITAMIN D) 1000 UNITS tablet Take 1,000 Units by mouth every evening.     . cloNIDine (CATAPRES) 0.1 MG tablet Take 1 tablet by mouth twice daily 180 tablet 0  . dofetilide (TIKOSYN) 250 MCG capsule Take 1 capsule (250 mcg total) by mouth 2 (two) times daily. 60 capsule 6  . glucose blood test strip Accu-Check Guide Strips. Dx E11.9 Use to test blood glucose level 2 times a day. 50 each 11  . magnesium oxide (MAG-OX) 400 MG tablet Take 1 tablet (400 mg total) by mouth 4 (four) times daily. (Patient taking differently: Take 800 mg by mouth 2 (two) times daily. ) 360 tablet 3  . metFORMIN (GLUCOPHAGE) 1000 MG tablet TAKE 1 TABLET BY MOUTH TWICE DAILY WITH  A MEAL 180 tablet 3  . nitroGLYCERIN (NITROSTAT) 0.4 MG SL tablet Place 1 tablet (0.4 mg total) under the tongue every 5 (five) minutes as needed for chest pain (x 3 doses). 30 tablet 3  . propranolol (INDERAL) 20 MG tablet Take one tablet by mouth PRN for irregular heartbeat.  Maximum 2  tablets in 24 hours. 30 tablet 3  . terazosin (HYTRIN) 1 MG capsule Take 1 mg by mouth at bedtime. (2100) 30 capsule 11  . vitamin C (ASCORBIC ACID) 500 MG tablet Take 500 mg by mouth daily.     . pantoprazole (PROTONIX) 40 MG tablet Take 1 tablet (40 mg total) by mouth daily. 90 tablet 3   No current facility-administered medications on file prior to visit.    Review of Systems  Constitutional: Negative for other unusual diaphoresis or sweats HENT: Negative for ear discharge or swelling Eyes: Negative for other worsening visual disturbances Respiratory: Negative for stridor or other swelling  Gastrointestinal: Negative for worsening distension or other blood Genitourinary: Negative for  retention or other urinary change Musculoskeletal: Negative for other MSK pain or swelling Skin: Negative for color change or other new lesions Neurological: Negative for worsening tremors and other numbness  Psychiatric/Behavioral: Negative for worsening agitation or other fatigue All other system neg per pt    Objective:   Physical Exam BP 140/82   Pulse 71   Temp 97.9 F (36.6 C) (Oral)   Ht 6\' 2"  (1.88 m)   Wt 208 lb (94.3 kg)   SpO2 97%   BMI 26.71 kg/m  VS noted,  Constitutional: Pt appears in NAD HENT: Head: NCAT.  Right Ear: External ear normal.  Left Ear: External ear normal.  Eyes: . Pupils are equal, round, and reactive to light. Conjunctivae and EOM are normal Nose: without d/c or deformity Neck: Neck supple. Gross normal ROM Cardiovascular: Normal rate and regular rhythm.   Pulmonary/Chest: Effort normal and breath sounds without rales or wheezing.  Abd:  Soft, NT, ND, + BS, no organomegaly Neurological: Pt is alert. At baseline orientation, motor grossly intact Skin: Skin is warm. No rashes, other new lesions, no LE edema Psychiatric: Pt behavior is normal without agitation  No other exam findings Lab Results  Component Value Date   WBC 5.9 03/31/2018   HGB 11.9 (L)  03/31/2018   HCT 36.3 (L) 03/31/2018   PLT 129.0 (L) 03/31/2018   GLUCOSE 114 (H) 04/18/2019   CHOL 108 04/18/2019   TRIG 104.0 04/18/2019   HDL 38.70 (L) 04/18/2019   LDLDIRECT 67.0 09/16/2016   LDLCALC 49 04/18/2019   ALT 12 04/18/2019   AST 19 04/18/2019   NA 138 04/18/2019   K 4.9 04/18/2019   CL 104 04/18/2019   CREATININE 2.26 (H) 04/18/2019   BUN 45 (H) 04/18/2019   CO2 27 04/18/2019   TSH 2.50 03/31/2018   PSA 1.09 03/31/2018   INR 1.19 09/04/2017   HGBA1C 6.8 (H) 04/18/2019   MICROALBUR 5.1 (H) 03/31/2018       Assessment & Plan:

## 2019-04-19 ENCOUNTER — Other Ambulatory Visit: Payer: Self-pay | Admitting: Internal Medicine

## 2019-04-19 DIAGNOSIS — N184 Chronic kidney disease, stage 4 (severe): Secondary | ICD-10-CM

## 2019-04-20 ENCOUNTER — Telehealth: Payer: Self-pay

## 2019-04-20 NOTE — Telephone Encounter (Signed)
Pt has viewed results via MyChart  

## 2019-04-20 NOTE — Telephone Encounter (Signed)
-----   Message from [name], MD sent at 04/19/2019  3:35 PM EDT ----- Left message on MyChart, pt to cont same tx except  The test results show that your current treatment is OK, as the tests are stable, except the kidney function is mildly worsening again.  We should refer you to Nephrology (kidney doctor) as I think we mentioned might be needed at your recent visit.  Lakeia Bradshaw to please inform pt, I will do referral

## 2019-04-23 ENCOUNTER — Encounter: Payer: Self-pay | Admitting: Internal Medicine

## 2019-04-23 NOTE — Assessment & Plan Note (Signed)
stable overall by history and exam, recent data reviewed with pt, and pt to continue medical treatment as before,  to f/u any worsening symptoms or concerns, for lab today

## 2019-04-25 ENCOUNTER — Ambulatory Visit: Payer: Medicare Other | Admitting: Podiatry

## 2019-04-27 ENCOUNTER — Encounter: Payer: Self-pay | Admitting: Podiatry

## 2019-04-27 ENCOUNTER — Other Ambulatory Visit: Payer: Self-pay

## 2019-04-27 ENCOUNTER — Ambulatory Visit (INDEPENDENT_AMBULATORY_CARE_PROVIDER_SITE_OTHER): Payer: Medicare Other | Admitting: Podiatry

## 2019-04-27 VITALS — Temp 97.5°F

## 2019-04-27 DIAGNOSIS — M79674 Pain in right toe(s): Secondary | ICD-10-CM | POA: Diagnosis not present

## 2019-04-27 DIAGNOSIS — Z7901 Long term (current) use of anticoagulants: Secondary | ICD-10-CM

## 2019-04-27 DIAGNOSIS — Q828 Other specified congenital malformations of skin: Secondary | ICD-10-CM | POA: Diagnosis not present

## 2019-04-27 DIAGNOSIS — M79675 Pain in left toe(s): Secondary | ICD-10-CM

## 2019-04-27 DIAGNOSIS — B351 Tinea unguium: Secondary | ICD-10-CM | POA: Diagnosis not present

## 2019-04-27 DIAGNOSIS — E1140 Type 2 diabetes mellitus with diabetic neuropathy, unspecified: Secondary | ICD-10-CM | POA: Diagnosis not present

## 2019-04-27 NOTE — Progress Notes (Signed)
Patient ID: Thomas Marshall, male   DOB: [birth year], 78 y.o.   MRN: [id number]  Subjective: 78 y.o. returns the office today for painful, elongated, thickened toenails which he cannot trim himself and for calluses on the right foot.  No redness or swelling to either the nails or callus site. Denies any acute changes since last appointment and no new complaints today. Denies any systemic complaints such as fevers, chills, nausea, vomiting.   He is on Eliquis   Objective: AAO 3, NAD DP/PT pulses palpable, CRT less than 3 seconds Protective sensation decreased with Simms Weinstein monofilament Nails hypertrophic, dystrophic, elongated, brittle, discolored 10. There is tenderness overlying the nails 1-5 bilaterally. There is no surrounding erythema or drainage along the nail sites. There is minimal hyperkeratotic lesion right fifth metatarsal base and fifth metatarsal head. No ulceration, drainage or any signs of infection. No significant hyperkeratotic lesion right fifth metatarsal base.  No pain with calf compression, swelling, warmth, erythema.  Assessment: Patient presents with symptomatic onychomycosis; hyperkeratotic tissue  Plan: -Treatment options including alternatives, risks, complications were discussed -Nails sharply debrided 10 without complication/bleeding. -Hyperkeratotic tissue sharply debrided x 2 without any complications or bleeding.  -Discussed daily foot inspection. If there are any changes, to call the office immediately.  -Follow-up in 3 months or sooner if any problems are to arise. In the meantime, encouraged to call the office with any questions, concerns, changes symptoms.  Celesta Gentile, DPM

## 2019-05-15 ENCOUNTER — Ambulatory Visit: Payer: Medicare Other | Admitting: Orthotics

## 2019-05-15 ENCOUNTER — Other Ambulatory Visit: Payer: Self-pay

## 2019-05-15 DIAGNOSIS — L84 Corns and callosities: Secondary | ICD-10-CM

## 2019-05-15 DIAGNOSIS — E1142 Type 2 diabetes mellitus with diabetic polyneuropathy: Secondary | ICD-10-CM

## 2019-05-15 DIAGNOSIS — Q828 Other specified congenital malformations of skin: Secondary | ICD-10-CM

## 2019-05-15 DIAGNOSIS — E1140 Type 2 diabetes mellitus with diabetic neuropathy, unspecified: Secondary | ICD-10-CM

## 2019-05-15 NOTE — Progress Notes (Signed)

## 2019-05-25 DIAGNOSIS — C4441 Basal cell carcinoma of skin of scalp and neck: Secondary | ICD-10-CM | POA: Diagnosis not present

## 2019-05-25 DIAGNOSIS — L570 Actinic keratosis: Secondary | ICD-10-CM | POA: Diagnosis not present

## 2019-06-07 ENCOUNTER — Ambulatory Visit: Payer: Medicare Other | Admitting: Orthotics

## 2019-06-07 ENCOUNTER — Other Ambulatory Visit: Payer: Self-pay

## 2019-06-07 DIAGNOSIS — E1140 Type 2 diabetes mellitus with diabetic neuropathy, unspecified: Secondary | ICD-10-CM

## 2019-06-07 DIAGNOSIS — Q828 Other specified congenital malformations of skin: Secondary | ICD-10-CM

## 2019-06-07 DIAGNOSIS — L84 Corns and callosities: Secondary | ICD-10-CM

## 2019-06-07 NOTE — Progress Notes (Signed)
Need to reorder in xw

## 2019-06-08 DIAGNOSIS — C4442 Squamous cell carcinoma of skin of scalp and neck: Secondary | ICD-10-CM | POA: Diagnosis not present

## 2019-06-19 ENCOUNTER — Other Ambulatory Visit: Payer: Self-pay

## 2019-06-19 ENCOUNTER — Ambulatory Visit (INDEPENDENT_AMBULATORY_CARE_PROVIDER_SITE_OTHER): Payer: Self-pay | Admitting: Orthotics

## 2019-06-19 DIAGNOSIS — Q828 Other specified congenital malformations of skin: Secondary | ICD-10-CM

## 2019-06-19 DIAGNOSIS — L84 Corns and callosities: Secondary | ICD-10-CM

## 2019-06-19 DIAGNOSIS — E1140 Type 2 diabetes mellitus with diabetic neuropathy, unspecified: Secondary | ICD-10-CM

## 2019-06-19 DIAGNOSIS — E1142 Type 2 diabetes mellitus with diabetic polyneuropathy: Secondary | ICD-10-CM

## 2019-06-26 ENCOUNTER — Ambulatory Visit (INDEPENDENT_AMBULATORY_CARE_PROVIDER_SITE_OTHER)
Admission: RE | Admit: 2019-06-26 | Discharge: 2019-06-26 | Disposition: A | Discharge status: Home / Self Care | Payer: Medicare Other | Source: Ambulatory Visit | Attending: Family Medicine | Admitting: Family Medicine

## 2019-06-26 ENCOUNTER — Ambulatory Visit (INDEPENDENT_AMBULATORY_CARE_PROVIDER_SITE_OTHER): Payer: Medicare Other | Admitting: Family Medicine

## 2019-06-26 ENCOUNTER — Encounter: Payer: Self-pay | Admitting: Family Medicine

## 2019-06-26 ENCOUNTER — Other Ambulatory Visit: Payer: Self-pay

## 2019-06-26 VITALS — BP 152/70 | HR 54 | Ht 74.0 in | Wt 210.0 lb

## 2019-06-26 DIAGNOSIS — M545 Low back pain, unspecified: Secondary | ICD-10-CM

## 2019-06-26 DIAGNOSIS — M5136 Other intervertebral disc degeneration, lumbar region: Secondary | ICD-10-CM

## 2019-06-26 IMAGING — DX LUMBAR SPINE - COMPLETE 4+ VIEW
5 series · 5 of 5 positions shown · non-contrast
Comparison: CT abdomen and pelvis [DATE]

CLINICAL DATA: Back pain

EXAM:
LUMBAR SPINE - COMPLETE 4+ VIEW

[l-spine ap]
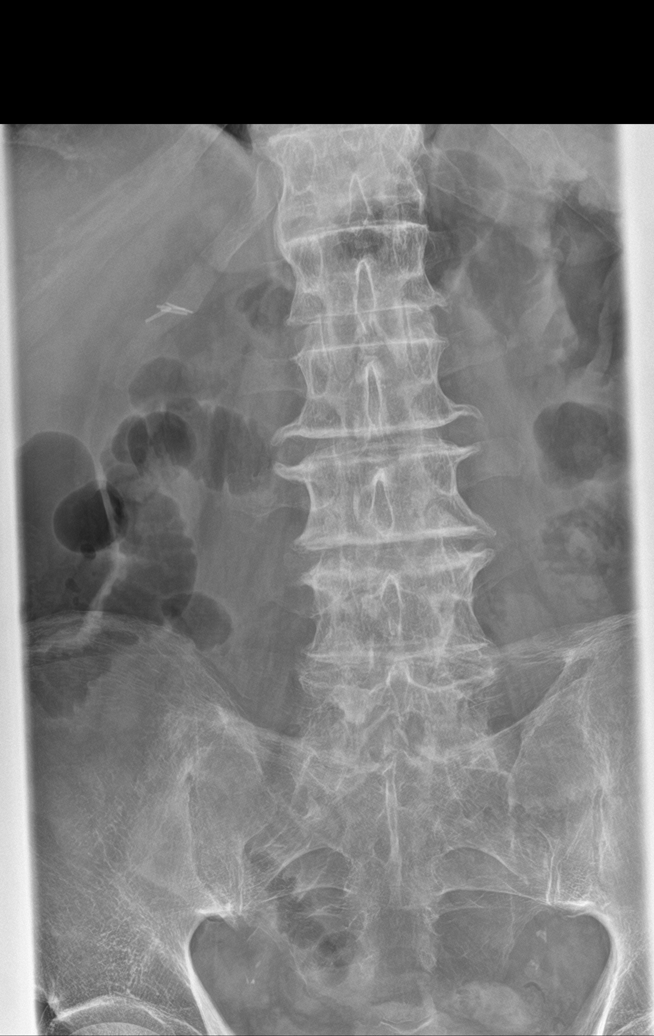

[l-spine obl (1 of 2)]
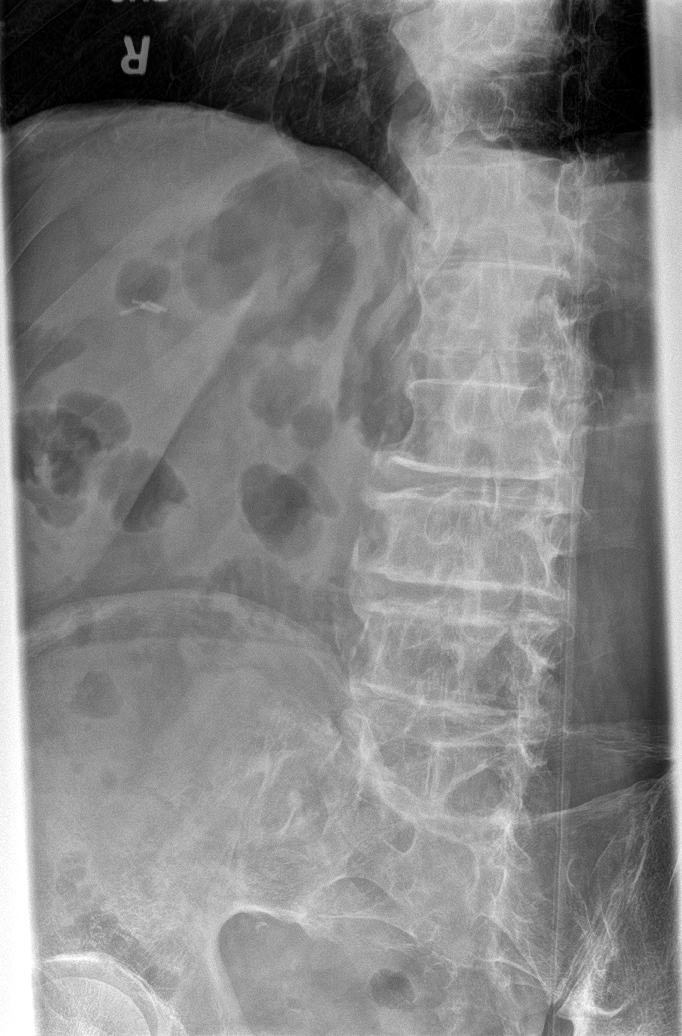

[l-spine obl (2 of 2)]
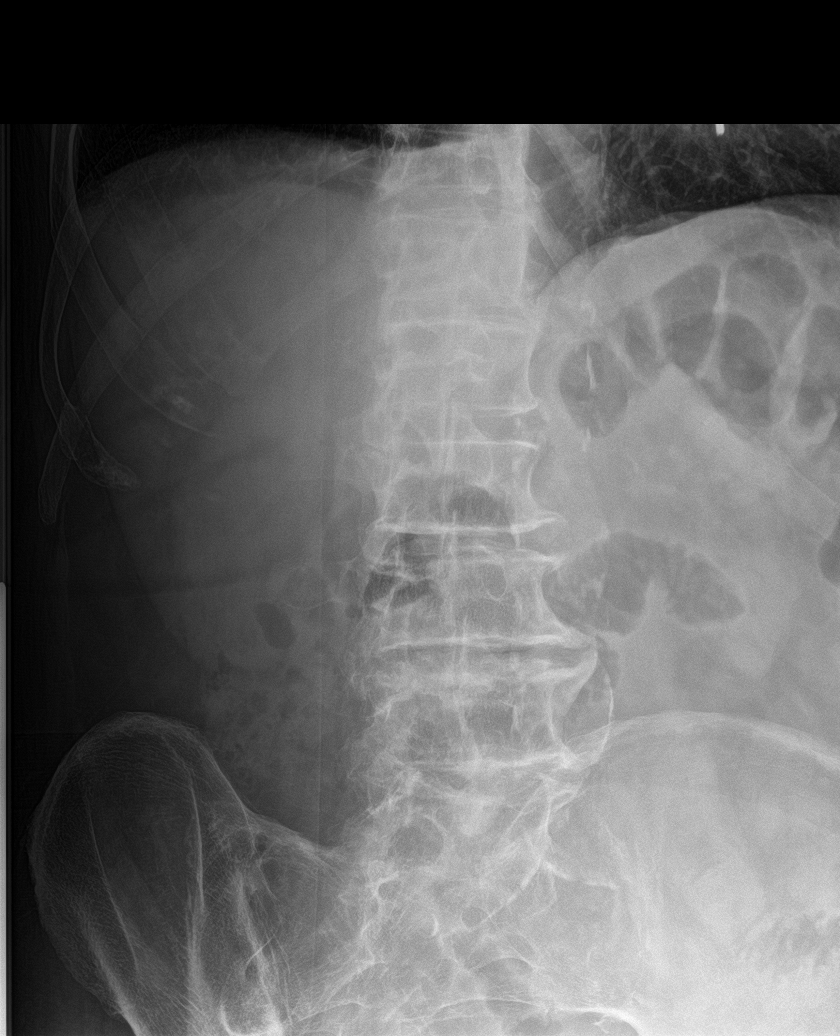

[l-spine lat]
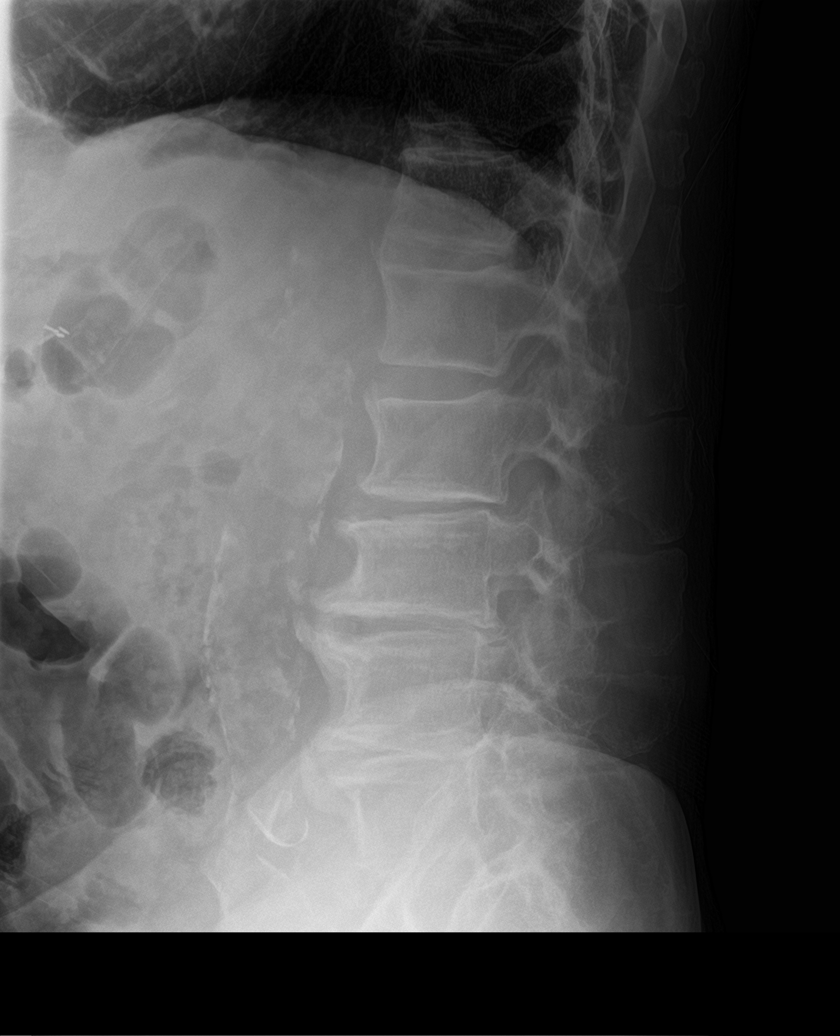

[l-spine spot]
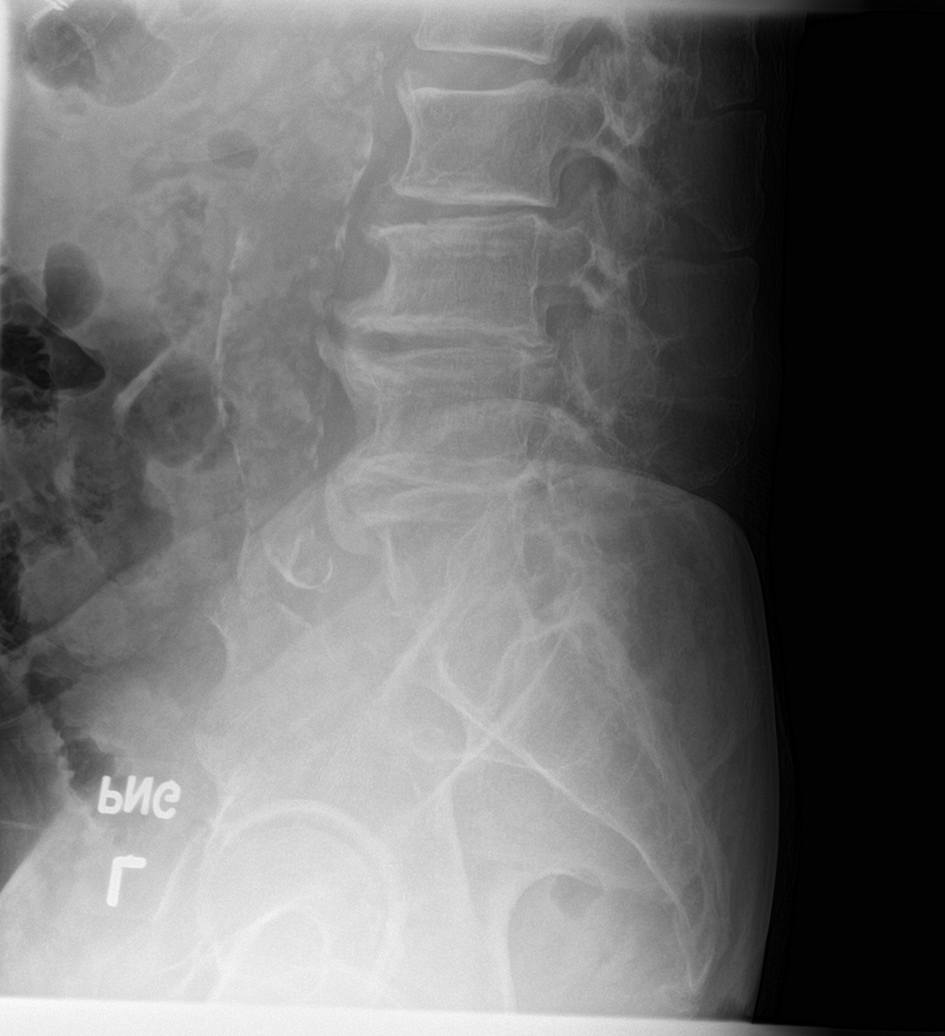

[5 of 5 positions shown; findings below may reference images not displayed]

FINDINGS: Osseous demineralization.

Five lumbar vertebra.

Calcified iliolumbar ligament on LEFT.

Disc space narrowing and endplate spur formation at multiple levels.

No fracture, subluxation, or bone destruction.

Question partial ankylosis of the SI joints.

Atherosclerotic calcifications aorta.
IMPRESSION: Multilevel degenerative disc disease changes of the lumbar spine.

Osseous demineralization.

No acute abnormalities.

Question partial ankylosis of the SI joints.

## 2019-06-26 MED ORDER — METHYLPREDNISOLONE ACETATE 40 MG/ML IJ SUSP
40.0000 mg | Freq: Once | INTRAMUSCULAR | Status: AC
  Administered 2019-06-26: 40 mg via INTRAMUSCULAR

## 2019-06-26 MED ORDER — GABAPENTIN 100 MG PO CAPS: 200.0000 mg | ORAL_CAPSULE | Freq: Every day | ORAL | 3 refills | Status: DC

## 2019-06-26 MED ORDER — KETOROLAC TROMETHAMINE 60 MG/2ML IM SOLN
60.0000 mg | Freq: Once | INTRAMUSCULAR | Status: AC
  Administered 2019-06-26: 60 mg via INTRAMUSCULAR

## 2019-06-26 NOTE — Progress Notes (Signed)
[name] [address], [place] [address] Phone: [phone] Subjective:   I Thomas Marshall am serving as a Education administrator for Dr. Hulan Saas.  I'm seeing this patient by the request  of:    CC: Low back pain  [id number]  Thomas Marshall is a 78 y.o. male coming in with complaint of back pain. Pain radiates down the right leg.   Onset- acute  Location - lower back (right sided) Duration-  Character-  Aggravating factors- standing up Reliving factors-  Therapies tried- Ice, topical, heat Severity-initially 8 out of 10 but now 5 out of 10.  History of back surgery in 1981.     Past Medical History:  Diagnosis Date  . AAA (abdominal aortic aneurysm) ([place])   . Arthritis   . Cancer (HCC)    skin & squamous cell  . Carotid artery disease ([place])    a. s/p R CEA 12/2015.  [name] CKD (chronic kidney disease), stage III ([place])    a. per historical labs.  . Coronary artery disease    a. s/p CABG 2004.  . Diabetes mellitus without complication ([place])   . GERD (gastroesophageal reflux disease)   . Hyperlipidemia   . Hypertension   . Medication intolerance Multiple   . PAF (paroxysmal atrial fibrillation) ([place])   . Pneumonia   . Sinus bradycardia    a. baseline HR 50s-60s, also h/o bradycardia on metoprolol and carvedilol   Past Surgical History:  Procedure Laterality Date  . ANKLE SURGERY     right fused  . BACK SURGERY     low back   . CHOLECYSTECTOMY N/A 12/07/2013   Procedure: LAPAROSCOPIC CHOLECYSTECTOMY ;  Surgeon: [name], MD;  Location: [facility];  Service: General;  Laterality: N/A;  . COLONOSCOPY    . CORONARY ARTERY BYPASS GRAFT  2004   [place]  . CORONARY STENT INTERVENTION N/A 05/21/2017   Procedure: Coronary Stent Intervention;  Surgeon: [name], MD;  Location: [facility];  Service: Cardiovascular;  Laterality: N/A;  . ENDARTERECTOMY Right 12/10/2015   Procedure: Right Carotid ENDARTERECTOMY;  Surgeon: Conrad La Luisa, MD;  Location: [facility];  Service: Vascular;  Laterality: Right;  . EXPLORATION POST OPERATIVE OPEN HEART    . FINGER SURGERY     skin graft on rt index  . FINGER SURGERY Right    right index finger.  [name] HERNIA REPAIR Right    [facility]  . JOINT REPLACEMENT    . KNEE SURGERY     replacement on both knees  . LEFT HEART CATH AND CORS/GRAFTS ANGIOGRAPHY N/A 05/21/2017   Procedure: Left Heart Cath and Cors/Grafts Angiography;  Surgeon: [name], MD;  Location: [facility];  Service: Cardiovascular;  Laterality: N/A;  . PROSTATE SURGERY    . PVC ABLATION N/A 10/27/2017   Procedure: PVC ABLATION;  Surgeon: Evans Lance, MD;  Location: [facility];  Service: Cardiovascular;  Laterality: N/A;  . TONSILLECTOMY    . URINARY SURGERY     scar tissue   Social History   Socioeconomic History  . Marital status: Married    Spouse name: Not on file  . Number of children: 2  . Years of education: Not on file  . Highest education level: Not on file  Occupational History  . Not on file  Social Needs  . Financial resource strain: Not hard at all  . Food insecurity    Worry: Never  true    Inability: Never true  . Transportation needs    Medical: No    Non-medical: No  Tobacco Use  . Smoking status: Former Smoker    Packs/day: 1.00    Years: 13.00    Pack years: 13.00    Quit date: 11/30/1970    Years since quitting: 48.6  . Smokeless tobacco: Never Used  Substance and Sexual Activity  . Alcohol use: No    Alcohol/week: 0.0 standard drinks  . Drug use: No  . Sexual activity: Yes  Lifestyle  . Physical activity    Days per week: 0 days    Minutes per session: 0 min  . Stress: Not at all  Relationships  . Social connections    Talks on phone: More than three times a week    Gets together: More than three times a week    Attends religious service: More than 4 times per year    Active member of club or organization: Yes    Attends meetings of clubs or  organizations: More than 4 times per year    Relationship status: Married  Other Topics Concern  . Not on file  Social History Narrative  . Not on file   Allergies  Allergen Reactions  . Lisinopril Swelling and Other (See Comments)    Angioedema.   . Losartan Swelling and Other (See Comments)    Lips swell Angioedema   . Nifedipine Swelling and Other (See Comments)    Sugar increase  . Triamterene Swelling  . Hydralazine Hcl Other (See Comments)    Fatigue; poor appetite  . Amoxicillin Rash and Other (See Comments)    Has patient had a PCN reaction causing immediate rash, facial/tongue/throat swelling, SOB or lightheadedness with hypotension:No Has patient had a PCN reaction causing severe rash involving mucus membranes or skin necrosis:No Has patient had a PCN reaction that required hospitalization:No Has patient had a PCN reaction occurring within the last 10 years: No If all of the above answers are "NO", then may proceed with Cephalosporin use.   . Ampicillin Rash and Other (See Comments)    Has patient had a PCN reaction causing immediate rash, facial/tongue/throat swelling, SOB or lightheadedness with hypotension:No Has patient had a PCN reaction causing severe rash involving mucus membranes or skin necrosis:No Has patient had a PCN reaction that required hospitalization:No Has patient had a PCN reaction occurring within the last 10 years: No If all of the above answers are "NO", then may proceed with Cephalosporin use.    . Carvedilol Other (See Comments)    Low heart rate    Family History  Problem Relation Age of Onset  . Stroke Mother   . Heart disease Mother   . Heart disease Father   . Hypertension Father   . Hyperlipidemia Father   . Cancer Sister        breast  and skin  . Diabetes Sister   . Hypertension Sister   . Diabetes Brother   . Heart disease Brother   . Hyperlipidemia Brother   . Hypertension Brother   . Early death Neg Hx     Current  Outpatient Medications (Endocrine & Metabolic):  .  metFORMIN (GLUCOPHAGE) 1000 MG tablet, TAKE 1 TABLET BY MOUTH TWICE DAILY WITH  A MEAL  Current Outpatient Medications (Cardiovascular):  .  amLODipine (NORVASC) 10 MG tablet, Take 1 tablet (10 mg total) by mouth daily. [name]  atorvastatin (LIPITOR) 40 MG tablet, Take 40 mg by mouth  at bedtime.  .  cloNIDine (CATAPRES) 0.1 MG tablet, Take 1 tablet by mouth twice daily .  dofetilide (TIKOSYN) 250 MCG capsule, Take 1 capsule (250 mcg total) by mouth 2 (two) times daily. .  nitroGLYCERIN (NITROSTAT) 0.4 MG SL tablet, Place 1 tablet (0.4 mg total) under the tongue every 5 (five) minutes as needed for chest pain (x 3 doses). .  propranolol (INDERAL) 20 MG tablet, Take one tablet by mouth PRN for irregular heartbeat.  Maximum 2 tablets in 24 hours. [name]  terazosin (HYTRIN) 1 MG capsule, Take 1 mg by mouth at bedtime. (2100)    Current Outpatient Medications (Hematological):  .  apixaban (ELIQUIS) 5 MG TABS tablet, Take 5 mg by mouth 2 (two) times daily. (0800 & 2000)  Current Outpatient Medications (Other):  .  cholecalciferol (VITAMIN D) 1000 UNITS tablet, Take 1,000 Units by mouth every evening.  [name]  glucose blood test strip, Accu-Check Guide Strips. Dx E11.9 Use to test blood glucose level 2 times a day. .  magnesium oxide (MAG-OX) 400 MG tablet, Take 1 tablet (400 mg total) by mouth 4 (four) times daily. (Patient taking differently: Take 800 mg by mouth 2 (two) times daily. ) .  vitamin C (ASCORBIC ACID) 500 MG tablet, Take 500 mg by mouth daily.  [name]  gabapentin (NEURONTIN) 100 MG capsule, Take 2 capsules (200 mg total) by mouth at bedtime. .  pantoprazole (PROTONIX) 40 MG tablet, Take 1 tablet (40 mg total) by mouth daily.    Past medical history, social, surgical and family history all reviewed in electronic medical record.  No pertanent information unless stated regarding to the chief complaint.   Review of Systems:  No headache, visual  changes, nausea, vomiting, diarrhea, constipation, dizziness, abdominal pain, skin rash, fevers, chills, night sweats, weight loss, swollen lymph nodes, body aches, joint swelling, chest pain, shortness of breath, mood changes.  Positive muscle aches  Objective  Blood pressure (!) 152/70, pulse (!) 54, height 6\' 2"  (1.88 m), weight 210 lb (95.3 kg), SpO2 96 %.   General: No apparent distress alert and oriented x3 mood and affect normal, dressed appropriately.  HEENT: Pupils equal, extraocular movements intact  Respiratory: Patient's speak in full sentences and does not appear short of breath  Cardiovascular: No lower extremity edema, non tender, no erythema  Skin: Warm dry intact with no signs of infection or rash on extremities or on axial skeleton.  Abdomen: Soft nontender  Neuro: Cranial nerves II through XII are intact, neurovascularly intact in all extremities with 2+ DTRs and 2+ pulses.  Lymph: No lymphadenopathy of posterior or anterior cervical chain or axillae bilaterally.  Gait normal with good balance and coordination.  MSK:  Non tender with full range of motion and good stability and symmetric strength and tone of shoulders, elbows, wrist, hip, knee and ankles bilaterally.  Back exam shows the patient does have some mild loss of lordosis.  Patient does have a incision from previous surgery well-healed.  Mild tenderness to palpation in the paraspinal musculature in the lumbar spine.  Negative straight leg test but patient does have weakness with dorsiflexion.  Deep tendon reflexes though are intact.  Neurovascular intact.   [id number]; 15 additional minutes spent for Therapeutic exercises as stated in above notes.  This included exercises focusing on stretching, strengthening, with significant focus on eccentric aspects.   Long term goals include an improvement in range of motion, strength, endurance as well as avoiding reinjury. Patient's frequency would include in 1-2  times a day, 3-5 times  a week for a duration of 6-12 weeks. Low back exercises that included:  Pelvic tilt/bracing instruction to focus on control of the pelvic girdle and lower abdominal muscles  Glute strengthening exercises, focusing on proper firing of the glutes without engaging the low back muscles Proper stretching techniques for maximum relief for the hamstrings, hip flexors, low back and some rotation where tolerated   Proper technique shown and discussed handout in great detail with ATC.  All questions were discussed and answered.     Impression and Recommendations:     This case required medical decision making of moderate complexity. The above documentation has been reviewed and is accurate and complete Thomas Pulley, DO       Note: This dictation was prepared with Dragon dictation along with smaller phrase technology. Any transcriptional errors that result from this process are unintentional.

## 2019-06-26 NOTE — Assessment & Plan Note (Signed)
worsenign Discussed HEP  Discussed which activities to do  Gabapentin  Xray  RTC in 4 weeks

## 2019-06-26 NOTE — Patient Instructions (Addendum)
Good to see you  Ice is your friend Exercises 3 times a week.  Gabapentin 200mg  at night- if groggy in MA go back  Back xray downstairs today to look at  It  See me again in 3 weeks Call me sooner if worsening

## 2019-06-29 ENCOUNTER — Telehealth: Payer: Self-pay | Admitting: Emergency Medicine

## 2019-06-29 NOTE — Telephone Encounter (Signed)
Pt is calling requesting results for back xray. Please advise.

## 2019-06-29 NOTE — Telephone Encounter (Signed)
Sent in my chart  Arthritis. Fairly severe  SI joints also a lot of arthritis. May need MRI if not better or injection in SI joints from me.  Will take time to get better

## 2019-06-30 NOTE — Telephone Encounter (Signed)
Pt aware of results 

## 2019-07-09 ENCOUNTER — Other Ambulatory Visit: Payer: Self-pay | Admitting: Cardiovascular Disease

## 2019-07-11 ENCOUNTER — Other Ambulatory Visit: Payer: Self-pay | Admitting: Cardiovascular Disease

## 2019-07-11 MED ORDER — CLONIDINE HCL 0.1 MG PO TABS: 0.1000 mg | ORAL_TABLET | Freq: Two times a day (BID) | ORAL | 0 refills | Status: DC

## 2019-07-11 NOTE — Telephone Encounter (Signed)
Pt's medication was sent to pt's pharmacy as requested. Confirmation received.  °

## 2019-07-17 NOTE — Progress Notes (Signed)
Cardiology Office Note    Date:  07/18/2019   ID:  Thomas Marshall, Thomas Marshall [birth year], MRN [id number]  PCP:  [name], MD  Cardiologist:  Dr. Burt Knack Electrophysiologist: [name]  Chief Complaint: 12  Months follow up  History of Present Illness:   Thomas Marshall is a 78 y.o. male with hx of CAD s/p CABG, symptomatic PVC s/p ablation by [name] 09/2017 with improved burden on holter,  S/p R CEA (followed by Dr. Bridgett Larsson), DM, PAF on eliquis, small AAA, HTN, CKD stage III-IV and HLD seen for follow up.   Last cath 04/2017 showed continued patency of the LIMA to LAD and RIMA to RCA, patency of the saphenous vein graft to OM1 with a moderate eccentric lesion just distal to the graft insertion site, and patency of the saphenous vein graft to ramus intermedius with a critical lesion in the proximal body the graft treated successfully with PCI using distal embolic protection and a 4.0 mm drug-eluting stent.  Here today for follow-up.  Serum creatinine 2.2 on last check 03/2019, previous baseline was 1.5-1.6.  PCP recommended nephrology follow-up but never happened.  Please check renal function today.  He denies chest pain, shortness of breath, palpitation, dizziness, orthopnea, PND, syncope, lower extremity edema or melena.  Compliant with his medication.  No regular exercise due to right hip pain.  However, he is active and doing household stuff and yard work.   Past Medical History:  Diagnosis Date  . AAA (abdominal aortic aneurysm) ([place])   . Arthritis   . Cancer (HCC)    skin & squamous cell  . Carotid artery disease ([place])    a. s/p R CEA 12/2015.  [name] CKD (chronic kidney disease), stage III ([place])    a. per historical labs.  . Coronary artery disease    a. s/p CABG 2004.  . Diabetes mellitus without complication ([place])   . GERD (gastroesophageal reflux disease)   . Hyperlipidemia   . Hypertension   . Medication intolerance Multiple   . PAF (paroxysmal atrial fibrillation) ([place])   .  Pneumonia   . Sinus bradycardia    a. baseline HR 50s-60s, also h/o bradycardia on metoprolol and carvedilol    Past Surgical History:  Procedure Laterality Date  . ANKLE SURGERY     right fused  . BACK SURGERY     low back   . CHOLECYSTECTOMY N/A 12/07/2013   Procedure: LAPAROSCOPIC CHOLECYSTECTOMY ;  Surgeon: [name], MD;  Location: [facility];  Service: General;  Laterality: N/A;  . COLONOSCOPY    . CORONARY ARTERY BYPASS GRAFT  2004   [place]  . CORONARY STENT INTERVENTION N/A 05/21/2017   Procedure: Coronary Stent Intervention;  Surgeon: [name], MD;  Location: [facility];  Service: Cardiovascular;  Laterality: N/A;  . ENDARTERECTOMY Right 12/10/2015   Procedure: Right Carotid ENDARTERECTOMY;  Surgeon: Conrad Renwick, MD;  Location: [facility];  Service: Vascular;  Laterality: Right;  . EXPLORATION POST OPERATIVE OPEN HEART    . FINGER SURGERY     skin graft on rt index  . FINGER SURGERY Right    right index finger.  [name] HERNIA REPAIR Right    [facility]  . JOINT REPLACEMENT    . KNEE SURGERY     replacement on both knees  . LEFT HEART CATH AND CORS/GRAFTS ANGIOGRAPHY N/A 05/21/2017   Procedure: Left Heart Cath and Cors/Grafts Angiography;  Surgeon: [name], MD;  Location: [facility];  Service: Cardiovascular;  Laterality: N/A;  . PROSTATE SURGERY    . PVC ABLATION N/A 10/27/2017   Procedure: PVC ABLATION;  Surgeon: Evans Lance, MD;  Location: [facility];  Service: Cardiovascular;  Laterality: N/A;  . TONSILLECTOMY    . URINARY SURGERY     scar tissue    Current Medications: Prior to Admission medications   Medication Sig Start Date End Date Taking? Authorizing Provider  amLODipine (NORVASC) 10 MG tablet Take 1 tablet (10 mg total) by mouth daily. 01/19/17   [name], MD  apixaban (ELIQUIS) 5 MG TABS tablet Take 5 mg by mouth 2 (two) times daily. (0800 & 2000)    [provider]  atorvastatin (LIPITOR) 40 MG tablet Take  40 mg by mouth at bedtime.     [provider]  cholecalciferol (VITAMIN D) 1000 UNITS tablet Take 1,000 Units by mouth every evening.     [provider]  cloNIDine (CATAPRES) 0.1 MG tablet Take 1 tablet (0.1 mg total) by mouth 2 (two) times daily. Please make overdue appt with Dr. Burt Knack before anymore refills. 1st attempt 07/11/19   [name], MD  dofetilide (TIKOSYN) 250 MCG capsule Take 1 capsule (250 mcg total) by mouth 2 (two) times daily. 09/30/16   Cheryln Manly, NP  gabapentin (NEURONTIN) 100 MG capsule Take 2 capsules (200 mg total) by mouth at bedtime. 06/26/19   Lyndal Pulley, DO  glucose blood test strip Accu-Check Guide Strips. Dx E11.9 Use to test blood glucose level 2 times a day. 08/05/18   [name], MD  magnesium oxide (MAG-OX) 400 MG tablet Take 1 tablet (400 mg total) by mouth 4 (four) times daily. Patient taking differently: Take 800 mg by mouth 2 (two) times daily.  12/03/16   [name], MD  metFORMIN (GLUCOPHAGE) 1000 MG tablet TAKE 1 TABLET BY MOUTH TWICE DAILY WITH  A MEAL 10/31/18   [name], MD  nitroGLYCERIN (NITROSTAT) 0.4 MG SL tablet Place 1 tablet (0.4 mg total) under the tongue every 5 (five) minutes as needed for chest pain (x 3 doses). 05/22/16   Virginia Crews, MD  pantoprazole (PROTONIX) 40 MG tablet Take 1 tablet (40 mg total) by mouth daily. 06/08/17 06/08/18  Leanor Kail, PA  propranolol (INDERAL) 20 MG tablet Take one tablet by mouth PRN for irregular heartbeat.  Maximum 2 tablets in 24 hours. 03/18/18   Evans Lance, MD  terazosin (HYTRIN) 1 MG capsule Take 1 mg by mouth at bedtime. (2100) 01/07/17   [name], MD  vitamin C (ASCORBIC ACID) 500 MG tablet Take 500 mg by mouth daily.     [provider]    Allergies:   Lisinopril, Losartan, Nifedipine, Triamterene, Hydralazine hcl, Amoxicillin, Ampicillin, and Carvedilol   Social History   Socioeconomic History  . Marital status: Married     Spouse name: Not on file  . Number of children: 2  . Years of education: Not on file  . Highest education level: Not on file  Occupational History  . Not on file  Social Needs  . Financial resource strain: Not hard at all  . Food insecurity    Worry: Never true    Inability: Never true  . Transportation needs    Medical: No    Non-medical: No  Tobacco Use  . Smoking status: Former Smoker    Packs/day: 1.00    Years: 13.00    Pack years: 13.00  Quit date: 11/30/1970    Years since quitting: 48.6  . Smokeless tobacco: Never Used  Substance and Sexual Activity  . Alcohol use: No    Alcohol/week: 0.0 standard drinks  . Drug use: No  . Sexual activity: Yes  Lifestyle  . Physical activity    Days per week: 0 days    Minutes per session: 0 min  . Stress: Not at all  Relationships  . Social connections    Talks on phone: More than three times a week    Gets together: More than three times a week    Attends religious service: More than 4 times per year    Active member of club or organization: Yes    Attends meetings of clubs or organizations: More than 4 times per year    Relationship status: Married  Other Topics Concern  . Not on file  Social History Narrative  . Not on file     Family History:  The patient's family history includes Cancer in his sister; Diabetes in his brother and sister; Heart disease in his brother, father, and mother; Hyperlipidemia in his brother and father; Hypertension in his brother, father, and sister; Stroke in his mother.   ROS:   Please see the history of present illness.    ROS All other systems reviewed and are negative.   PHYSICAL EXAM:   VS:  BP (!) 142/68   Pulse 70   Ht 6\' 2"  (1.88 m)   Wt 210 lb 12.8 oz (95.6 kg)   SpO2 97%   BMI 27.07 kg/m    GEN: Well nourished, well developed, in no acute distress  HEENT: normal  Neck: no JVD, carotid bruits, or masses Cardiac: RRR; no murmurs, rubs, or gallops,no edema  Respiratory:   clear to auscultation bilaterally, normal work of breathing GI: soft, nontender, nondistended, + BS MS: no deformity or atrophy  Skin: warm and dry, no rash Neuro:  Alert and Oriented x 3, Strength and sensation are intact Psych: euthymic mood, full affect  Wt Readings from Last 3 Encounters:  07/18/19 210 lb 12.8 oz (95.6 kg)  06/26/19 210 lb (95.3 kg)  04/18/19 208 lb (94.3 kg)      Studies/Labs Reviewed:   EKG:  EKG is ordered today.  The ekg ordered today demonstrates normal sinus rhythm at rate of 70 bpm, PVC, chronic right bundle branch block  Recent Labs: 04/18/2019: ALT 12; BUN 45; Creatinine, Ser 2.26; Potassium 4.9; Sodium 138   Lipid Panel    Component Value Date/Time   CHOL 108 04/18/2019 1127   TRIG 104.0 04/18/2019 1127   HDL 38.70 (L) 04/18/2019 1127   CHOLHDL 3 04/18/2019 1127   VLDL 20.8 04/18/2019 1127   LDLCALC 49 04/18/2019 1127   LDLDIRECT 67.0 09/16/2016 1455    Additional studies/ records that were reviewed today include:   Echocardiogram: 08/2017 Left ventricle: The cavity size was normal. There was moderate   focal basal hypertrophy of the septum with otherwise moderate   concentric hypertrophy. Systolic function was normal. The   estimated ejection fraction was in the range of 55% to 60%. Wall   motion was normal; there were no regional wall motion   abnormalities. The study is not technically sufficient to allow   evaluation of LV diastolic function. - Aortic valve: Trileaflet; severely thickened, severely calcified   leaflets. The non-coronary cusp is most affected. Noncoronary   cusp mobility was severely restricted. Valve area (VTI): 2.39   cm^2. Valve  area (Vmax): 2.36 cm^2. Valve area (Vmean): 1.85   cm^2. - Mitral valve: Moderately calcified annulus. Transvalvular   velocity was within the normal range. There was no evidence for   stenosis. There was mild regurgitation. - Left atrium: The atrium was moderately dilated. - Right  ventricle: The cavity size was normal. Wall thickness was   normal. Systolic function was normal. - Atrial septum: No defect or patent foramen ovale was identified   by color flow Doppler. - Tricuspid valve: There was trivial regurgitation. - Pulmonary arteries: Systolic pressure was mildly increased. PA   peak pressure: 41 mm Hg (S).  ------------------------------------------------------------------- Labs, prior tests, procedures, and surgery: Coronary artery bypass grafting.  ------------------------------------------------------------------- Study data:  Comparison was made to the study of 05/27/2016.  Study status:  Routine.  Procedure:  The patient reported no pain pre or post test. Transthoracic echocardiography. Image quality was adequate.  Study completion:  There were no complications. Transthoracic echocardiography.  M-mode, complete 2D, spectral Doppler, and color Doppler.  Birthdate:  Patient birthdate: 1941-04-13.  Age:  Patient is 78 yr old.  Sex:  Gender: male. BMI: 28.5 kg/m^2.  Blood pressure:     134/62  Patient status: Outpatient.  Study date:  Study date: 09/29/2017. Study time: 09:26 AM.  Location:  [facility]  -------------------------------------------------------------------   Coronary Stent Intervention   04/2017  Left Heart Cath and Cors/Grafts Angiography  Conclusion  1. Severe native coronary artery disease with total occlusion of the left main and total occlusion of the RCA 2. Status post aortocoronary bypass surgery with continued patency of the LIMA to LAD and RIMA to RCA, patency of the saphenous vein graft to OM1 with a moderate eccentric lesion just distal to the graft insertion site, and patency of the saphenous vein graft to ramus intermedius with a critical lesion in the proximal body the graft treated successfully with PCI using distal embolic protection and a 4.0 mm drug-eluting stent  Recommendations: The patient will be hydrated  for 6 hours. He will be discharged this evening with follow-up labs next week. Recommend aspirin, Plavix, and Eliquis 1 month, then discontinue aspirin. He should start back on Eliquis tomorrow evening.       ASSESSMENT & PLAN:    1. CAD -No angina.  Continue beta-blocker and statin.  Not on aspirin due to need of anticoagulation.  2. PAF -Maintaining sinus rhythm.  Continue current medical therapy. Check BMET, may need to lower Eliquis dose based on renal function.  3. HTN -Blood pressure relatively stable on current medications.  4. HLD - 04/18/2019: Cholesterol 108; HDL 38.70; LDL Cholesterol 49; Triglycerides 104.0; VLDL 20.8  -Continue statin.  LDL at goal.  5. Symptomatic PVC s/p ablation  -Asymptomatic.  Continue beta-blocker.  6. S/p R CEA and small AAA - followed by vascular  7.  CKD stage III/IV -Check renal function today.    Medication Adjustments/Labs and Tests Ordered: Current medicines are reviewed at length with the patient today.  Concerns regarding medicines are outlined above.  Medication changes, Labs and Tests ordered today are listed in the Patient Instructions below. Patient Instructions  Medication Instructions:  Your physician recommends that you continue on your current medications as directed. Please refer to the Current Medication list given to you today.  If you need a refill on your cardiac medications before your next appointment, please call your pharmacy.   Lab work: TODAY:  BMET  If you have labs (blood work) drawn today and your  tests are completely normal, you will receive your results only by: [name] MyChart Message (if you have MyChart) OR . A paper copy in the mail If you have any lab test that is abnormal or we need to change your treatment, we will call you to review the results.  Testing/Procedures: None ordered  Follow-Up: At [facility], you and your health needs are our priority.  As part of our continuing mission to  provide you with exceptional heart care, we have created designated Provider Care Teams.  These Care Teams include your primary Cardiologist (physician) and Advanced Practice Providers (APPs -  Physician Assistants and Nurse Practitioners) who all work together to provide you with the care you need, when you need it. You will need a follow up appointment in:  12 months.  Please call our office 2 months in advance to schedule this appointment.  You may see Dr. Burt Knack or one of the following Advanced Practice Providers on your designated Care Team: Richardson Dopp, PA-C [place], [place] . Daune Perch, NP  Any Other Special Instructions Will Be Listed Below (If Applicable).       Jarrett Soho, [place]  07/18/2019 9:02 AM    [address], [place], [place]  [address] Phone: [phone]; Fax: [phone]

## 2019-07-18 ENCOUNTER — Ambulatory Visit (INDEPENDENT_AMBULATORY_CARE_PROVIDER_SITE_OTHER): Payer: Medicare Other | Admitting: Physician Assistant

## 2019-07-18 ENCOUNTER — Other Ambulatory Visit: Payer: Self-pay

## 2019-07-18 ENCOUNTER — Encounter: Payer: Self-pay | Admitting: Physician Assistant

## 2019-07-18 VITALS — BP 142/68 | HR 70 | Ht 74.0 in | Wt 210.8 lb

## 2019-07-18 DIAGNOSIS — I10 Essential (primary) hypertension: Secondary | ICD-10-CM | POA: Diagnosis not present

## 2019-07-18 DIAGNOSIS — N183 Chronic kidney disease, stage 3 unspecified: Secondary | ICD-10-CM

## 2019-07-18 DIAGNOSIS — I480 Paroxysmal atrial fibrillation: Secondary | ICD-10-CM

## 2019-07-18 DIAGNOSIS — I714 Abdominal aortic aneurysm, without rupture, unspecified: Secondary | ICD-10-CM

## 2019-07-18 DIAGNOSIS — I493 Ventricular premature depolarization: Secondary | ICD-10-CM | POA: Diagnosis not present

## 2019-07-18 DIAGNOSIS — I25110 Atherosclerotic heart disease of native coronary artery with unstable angina pectoris: Secondary | ICD-10-CM

## 2019-07-18 LAB — BASIC METABOLIC PANEL
BUN/Creatinine Ratio: 17 (ref 10–24)
BUN: 28 mg/dL — ABNORMAL HIGH (ref 8–27)
CO2: 24 mmol/L (ref 20–29)
Calcium: 9.2 mg/dL (ref 8.6–10.2)
Chloride: 104 mmol/L (ref 96–106)
Creatinine, Ser: 1.65 mg/dL — ABNORMAL HIGH (ref 0.76–1.27)
GFR calc Af Amer: 45 mL/min/{1.73_m2} — ABNORMAL LOW (ref >59)
GFR calc non Af Amer: 39 mL/min/{1.73_m2} — ABNORMAL LOW (ref >59)
Glucose: 192 mg/dL — ABNORMAL HIGH (ref 65–99)
Potassium: 4.9 mmol/L (ref 3.5–5.2)
Sodium: 142 mmol/L (ref 134–144)

## 2019-07-18 NOTE — Patient Instructions (Signed)
Medication Instructions:  Your physician recommends that you continue on your current medications as directed. Please refer to the Current Medication list given to you today.  If you need a refill on your cardiac medications before your next appointment, please call your pharmacy.   Lab work: TODAY:  BMET  If you have labs (blood work) drawn today and your tests are completely normal, you will receive your results only by: [name] MyChart Message (if you have MyChart) OR . A paper copy in the mail If you have any lab test that is abnormal or we need to change your treatment, we will call you to review the results.  Testing/Procedures: None ordered  Follow-Up: At [facility], you and your health needs are our priority.  As part of our continuing mission to provide you with exceptional heart care, we have created designated Provider Care Teams.  These Care Teams include your primary Cardiologist (physician) and Advanced Practice Providers (APPs -  Physician Assistants and Nurse Practitioners) who all work together to provide you with the care you need, when you need it. You will need a follow up appointment in:  12 months.  Please call our office 2 months in advance to schedule this appointment.  You may see Dr. Burt Knack or one of the following Advanced Practice Providers on your designated Care Team: Richardson Dopp, PA-C [place], [place] . Daune Perch, NP  Any Other Special Instructions Will Be Listed Below (If Applicable).

## 2019-07-20 ENCOUNTER — Encounter: Payer: Self-pay | Admitting: Family Medicine

## 2019-07-20 ENCOUNTER — Ambulatory Visit (INDEPENDENT_AMBULATORY_CARE_PROVIDER_SITE_OTHER): Payer: Medicare Other | Admitting: Family Medicine

## 2019-07-20 ENCOUNTER — Other Ambulatory Visit: Payer: Self-pay

## 2019-07-20 ENCOUNTER — Other Ambulatory Visit: Payer: Self-pay | Admitting: Physician Assistant

## 2019-07-20 VITALS — BP 132/82 | HR 62 | Wt 212.0 lb

## 2019-07-20 DIAGNOSIS — I25110 Atherosclerotic heart disease of native coronary artery with unstable angina pectoris: Secondary | ICD-10-CM | POA: Diagnosis not present

## 2019-07-20 DIAGNOSIS — M5136 Other intervertebral disc degeneration, lumbar region: Secondary | ICD-10-CM

## 2019-07-20 DIAGNOSIS — M47818 Spondylosis without myelopathy or radiculopathy, sacral and sacrococcygeal region: Secondary | ICD-10-CM

## 2019-07-20 MED ORDER — CLONIDINE HCL 0.1 MG PO TABS: 0.1000 mg | ORAL_TABLET | Freq: Two times a day (BID) | ORAL | 3 refills | Status: DC

## 2019-07-20 NOTE — Progress Notes (Signed)
[name] [address] [place], [place] [address] Phone: [phone] Subjective:   [name], am serving as a scribe for Dr. Hulan Saas.  I'm seeing this patient by the request  of:    CC: Back pain follow-up  CHY:[id number]   06/26/2019 worsenign Discussed HEP  Discussed which activities to do  Gabapentin  Xray  RTC in 4 weeks  Update 07/20/2019 Thomas Marshall is a 78 y.o. male coming in with complaint of back pain. Patient states that his right hip has been bothering him. Radiating pain down the back of the leg. Has good and bad days. Is using gabapentin prn. Tried exercises and they did not help.  Patient still states that the back pain is seems to be of the lower aspect.  Radiation down the right leg.  Patient did have x-rays.  X-rays were independently visualized by me showing significant degenerative disc disease as well as ankylosing of the sacroiliac joints right greater than left.    Past Medical History:  Diagnosis Date   AAA (abdominal aortic aneurysm) ([facility])    Arthritis    Cancer ([facility])    skin & squamous cell   Carotid artery disease ([facility])    a. s/p R CEA 12/2015.   CKD (chronic kidney disease), stage III ([facility])    a. per historical labs.   Coronary artery disease    a. s/p CABG 2004.   Diabetes mellitus without complication ([facility])    GERD (gastroesophageal reflux disease)    Hyperlipidemia    Hypertension    Medication intolerance Multiple    PAF (paroxysmal atrial fibrillation) ([facility])    Pneumonia    Sinus bradycardia    a. baseline HR 50s-60s, also h/o bradycardia on metoprolol and carvedilol   Past Surgical History:  Procedure Laterality Date   ANKLE SURGERY     right fused   BACK SURGERY     low back    CHOLECYSTECTOMY N/A 12/07/2013   Procedure: LAPAROSCOPIC CHOLECYSTECTOMY ;  Surgeon: [name], MD;  Location: [facility];  Service: General;  Laterality: N/A;   COLONOSCOPY     CORONARY ARTERY  BYPASS GRAFT  2004   [place] N/A 05/21/2017   Procedure: Coronary Stent Intervention;  Surgeon: [name], MD;  Location: [facility];  Service: Cardiovascular;  Laterality: N/A;   ENDARTERECTOMY Right 12/10/2015   Procedure: Right Carotid ENDARTERECTOMY;  Surgeon: Conrad Bartolo, MD;  Location: [facility];  Service: Vascular;  Laterality: Right;   EXPLORATION POST OPERATIVE OPEN HEART     FINGER SURGERY     skin graft on rt index   FINGER SURGERY Right    right index finger.   HERNIA REPAIR Right    RIH   JOINT REPLACEMENT     KNEE SURGERY     replacement on both knees   LEFT HEART CATH AND CORS/GRAFTS ANGIOGRAPHY N/A 05/21/2017   Procedure: Left Heart Cath and Cors/Grafts Angiography;  Surgeon: [name], MD;  Location: [facility];  Service: Cardiovascular;  Laterality: N/A;   PROSTATE SURGERY     PVC ABLATION N/A 10/27/2017   Procedure: PVC ABLATION;  Surgeon: Evans Lance, MD;  Location: [facility];  Service: Cardiovascular;  Laterality: N/A;   TONSILLECTOMY     URINARY SURGERY     scar tissue   Social History   Socioeconomic History   Marital status: Married  Spouse name: Not on file   Number of children: 2   Years of education: Not on file   Highest education level: Not on file  Occupational History   Not on file  Social Needs   Financial resource strain: Not hard at all   Food insecurity    Worry: Never true    Inability: Never true   Transportation needs    Medical: No    Non-medical: No  Tobacco Use   Smoking status: Former Smoker    Packs/day: 1.00    Years: 13.00    Pack years: 13.00    Quit date: 11/30/1970    Years since quitting: 48.6   Smokeless tobacco: Never Used  Substance and Sexual Activity   Alcohol use: No    Alcohol/week: 0.0 standard drinks   Drug use: No   Sexual activity: Yes  Lifestyle   Physical activity    Days per week: 0 days    Minutes per  session: 0 min   Stress: Not at all  Relationships   Social connections    Talks on phone: More than three times a week    Gets together: More than three times a week    Attends religious service: More than 4 times per year    Active member of club or organization: Yes    Attends meetings of clubs or organizations: More than 4 times per year    Relationship status: Married  Other Topics Concern   Not on file  Social History Narrative   Not on file   Allergies  Allergen Reactions   Lisinopril Swelling and Other (See Comments)    Angioedema.    Losartan Swelling and Other (See Comments)    Lips swell Angioedema    Nifedipine Swelling and Other (See Comments)    Sugar increase   Triamterene Swelling   Hydralazine Hcl Other (See Comments)    Fatigue; poor appetite   Amoxicillin Rash and Other (See Comments)    Has patient had a PCN reaction causing immediate rash, facial/tongue/throat swelling, SOB or lightheadedness with hypotension:No Has patient had a PCN reaction causing severe rash involving mucus membranes or skin necrosis:No Has patient had a PCN reaction that required hospitalization:No Has patient had a PCN reaction occurring within the last 10 years: No If all of the above answers are "NO", then may proceed with Cephalosporin use.    Ampicillin Rash and Other (See Comments)    Has patient had a PCN reaction causing immediate rash, facial/tongue/throat swelling, SOB or lightheadedness with hypotension:No Has patient had a PCN reaction causing severe rash involving mucus membranes or skin necrosis:No Has patient had a PCN reaction that required hospitalization:No Has patient had a PCN reaction occurring within the last 10 years: No If all of the above answers are "NO", then may proceed with Cephalosporin use.     Carvedilol Other (See Comments)    Low heart rate    Family History  Problem Relation Age of Onset   Stroke Mother    Heart disease Mother     Heart disease Father    Hypertension Father    Hyperlipidemia Father    Cancer Sister        breast  and skin   Diabetes Sister    Hypertension Sister    Diabetes Brother    Heart disease Brother    Hyperlipidemia Brother    Hypertension Brother    Early death Neg Hx     Current Outpatient Medications (  Endocrine & Metabolic):    metFORMIN (GLUCOPHAGE) 1000 MG tablet, TAKE 1 TABLET BY MOUTH TWICE DAILY WITH  A MEAL  Current Outpatient Medications (Cardiovascular):    amLODipine (NORVASC) 10 MG tablet, Take 1 tablet (10 mg total) by mouth daily.   atorvastatin (LIPITOR) 40 MG tablet, Take 40 mg by mouth at bedtime.    cloNIDine (CATAPRES) 0.1 MG tablet, Take 1 tablet (0.1 mg total) by mouth 2 (two) times daily.   dofetilide (TIKOSYN) 250 MCG capsule, Take 1 capsule (250 mcg total) by mouth 2 (two) times daily.   nitroGLYCERIN (NITROSTAT) 0.4 MG SL tablet, Place 1 tablet (0.4 mg total) under the tongue every 5 (five) minutes as needed for chest pain (x 3 doses).   propranolol (INDERAL) 20 MG tablet, Take one tablet by mouth PRN for irregular heartbeat.  Maximum 2 tablets in 24 hours.   terazosin (HYTRIN) 1 MG capsule, Take 1 mg by mouth at bedtime. (2100)    Current Outpatient Medications (Hematological):    apixaban (ELIQUIS) 5 MG TABS tablet, Take 5 mg by mouth 2 (two) times daily. (0800 & 2000)  Current Outpatient Medications (Other):    cholecalciferol (VITAMIN D) 1000 UNITS tablet, Take 1,000 Units by mouth every evening.    gabapentin (NEURONTIN) 100 MG capsule, Take 2 capsules (200 mg total) by mouth at bedtime. (Patient taking differently: Take 200 mg by mouth daily as needed. )   glucose blood test strip, Accu-Check Guide Strips. Dx E11.9 Use to test blood glucose level 2 times a day.   magnesium oxide (MAG-OX) 400 MG tablet, Take 1 tablet (400 mg total) by mouth 4 (four) times daily. (Patient taking differently: Take 800 mg by mouth 2 (two) times  daily. )   pantoprazole (PROTONIX) 40 MG tablet, Take 1 tablet (40 mg total) by mouth daily.   vitamin C (ASCORBIC ACID) 500 MG tablet, Take 500 mg by mouth daily.     Past medical history, social, surgical and family history all reviewed in electronic medical record.  No pertanent information unless stated regarding to the chief complaint.   Review of Systems:  No headache, visual changes, nausea, vomiting, diarrhea, constipation, dizziness, abdominal pain, skin rash, fevers, chills, night sweats, weight loss, swollen lymph nodes, body aches, joint swelling, , chest pain, shortness of breath, mood changes.  Positive muscle aches  Objective  Blood pressure 132/82, pulse 62, weight 212 lb (96.2 kg), SpO2 98 %.   General: No apparent distress alert and oriented x3 mood and affect normal, dressed appropriately.  HEENT: Pupils equal, extraocular movements intact  Respiratory: Patient's speak in full sentences and does not appear short of breath  Cardiovascular: No lower extremity edema, non tender, no erythema  Skin: Warm dry intact with no signs of infection or rash on extremities or on axial skeleton.  Abdomen: Soft nontender  Neuro: Cranial nerves II through XII are intact, neurovascularly intact in all extremities with  2+ pulses.  Lymph: No lymphadenopathy of posterior or anterior cervical chain or axillae bilaterally.  Gait antalgic MSK:  tender with limited range of motion and good stability and symmetric strength and tone of shoulders, elbows, wrist, hip, knee and ankles bilaterally.  Arthritic changes of multiple joints  Back exam shows severe loss of lordosis with some degenerative scoliosis.  Patient does have a positive straight leg test with radicular symptoms on the right side at 35 degrees of forward flexion.  Patient does have tenderness to palpation over the sacroiliac joints bilaterally right greater  than left.  Limited range of motion in all planes lacking at least 10  degrees.  Neurovascularly though intact mild decrease in deep tendon reflex on the right Achilles compared to the contralateral side  Procedure: Real-time Ultrasound Guided Injection of right sacroiliac joint Device: GE Logiq Q7 Ultrasound guided injection is preferred based studies that show increased duration, increased effect, greater accuracy, decreased procedural pain, increased response rate, and decreased cost with ultrasound guided versus blind injection.  Verbal informed consent obtained.  Time-out conducted.  Noted no overlying erythema, induration, or other signs of local infection.  Skin prepped in a sterile fashion.  Local anesthesia: Topical Ethyl chloride.  With sterile technique and under real time ultrasound guidance: With a 21-gauge 2 inch needle patient was injected in the right sacroiliac joint with a total of 0.5 cc of 0.5% Marcaine and 0.5 cc of Kenalog 40 mg/mL Completed without difficulty  Pain immediately resolved suggesting accurate placement of the medication.  Advised to call if fevers/chills, erythema, induration, drainage, or persistent bleeding.  Images permanently stored and available for review in the ultrasound unit.  Impression: Technically successful ultrasound guided injection.    Impression and Recommendations:     This case required medical decision making of moderate complexity. The above documentation has been reviewed and is accurate and complete Lyndal Pulley, DO       Note: This dictation was prepared with Dragon dictation along with smaller phrase technology. Any transcriptional errors that result from this process are unintentional.

## 2019-07-20 NOTE — Telephone Encounter (Signed)
° ° °  Patient requesting to refills and 90 DAY supply for cheaper cost   *STAT* If patient is at the pharmacy, call can be transferred to refill team.   1. Which medications need to be refilled? (please list name of each medication and dose if known) cloNIDine (CATAPRES) 0.1 MG tablet 2. Which pharmacy/location (including street and city if local pharmacy) is medication to be sent to?[facility] [address]  3. Do they need a 30 day or 90 day supply? [place]

## 2019-07-20 NOTE — Patient Instructions (Signed)
Great to see you  Tried a right SI injection.  If not better call on Monday or next week and I would want a CT scan of the lower back to see if epidural injections could be helpful If injection works we can repeat every 3 months

## 2019-07-20 NOTE — Telephone Encounter (Signed)
Pt's medication was sent to pt's pharmacy as requested. Confirmation received.  °

## 2019-07-21 ENCOUNTER — Encounter: Payer: Self-pay | Admitting: Family Medicine

## 2019-07-21 DIAGNOSIS — M47818 Spondylosis without myelopathy or radiculopathy, sacral and sacrococcygeal region: Secondary | ICD-10-CM | POA: Insufficient documentation

## 2019-07-21 NOTE — Assessment & Plan Note (Signed)
Patient given injection and tolerated the procedure well.  Discussed icing regimen.  Still concerned that it is seems to be more of the back.  If continuing to have pain will need to consider a CT myelogram with patient having difficulty with MRI.  Patient could be a candidate for epidurals if necessary.  Patient will call if this does not seem to be helping and then we will discuss further advanced imaging.

## 2019-07-24 ENCOUNTER — Telehealth: Payer: Self-pay

## 2019-07-24 ENCOUNTER — Encounter: Payer: Self-pay | Admitting: Family Medicine

## 2019-07-24 ENCOUNTER — Other Ambulatory Visit: Payer: Self-pay

## 2019-07-24 DIAGNOSIS — G8929 Other chronic pain: Secondary | ICD-10-CM

## 2019-07-24 DIAGNOSIS — M545 Low back pain, unspecified: Secondary | ICD-10-CM

## 2019-07-24 NOTE — Addendum Note (Signed)
Addended by: Douglass Rivers T on: 07/24/2019 12:35 PM   Modules accepted: Orders

## 2019-07-24 NOTE — Telephone Encounter (Signed)
Called patient to let him know that a CT scan would be put in due to no progress in pain. His granddaughter [place] informed us that he is not doing any better.

## 2019-07-26 ENCOUNTER — Other Ambulatory Visit: Payer: Self-pay

## 2019-07-26 ENCOUNTER — Telehealth: Payer: Self-pay | Admitting: Nurse Practitioner

## 2019-07-26 ENCOUNTER — Ambulatory Visit (INDEPENDENT_AMBULATORY_CARE_PROVIDER_SITE_OTHER): Payer: Medicare Other

## 2019-07-26 DIAGNOSIS — Z23 Encounter for immunization: Secondary | ICD-10-CM

## 2019-07-26 NOTE — Telephone Encounter (Signed)
Phone call to patient to verify medication list and allergies for myelogram procedure. Pt aware he will need to hold Eliquis prior to myelogram appointment time, pending approval and recommended hold time from cardiologist Dr. [name]. Pt verbalized understanding. Pre and post procedure instructions reviewed with pt. Blood thinner hold request faxed to Dr. Burt Knack.

## 2019-07-27 ENCOUNTER — Telehealth: Payer: Self-pay | Admitting: Physician Assistant

## 2019-07-27 ENCOUNTER — Telehealth: Payer: Self-pay | Admitting: Cardiovascular Disease

## 2019-07-27 MED ORDER — ASPIRIN EC 81 MG PO TBEC: 81.0000 mg | DELAYED_RELEASE_TABLET | Freq: Every day | ORAL | 3 refills | Status: AC

## 2019-07-27 NOTE — Telephone Encounter (Signed)
°  Pt c/o medication issue:  1. Name of Medication: aspirin 2. How are you currently taking this medication (dosage and times per day)? daily 3. Are you having a reaction (difficulty breathing--STAT)? n/a  4. What is your medication issue? Aspirin is not listed in patient profile, however he takes aspirin daily. Bhagat has given pre procedure instructions but patient now is unsure if he even needs the aspirin at all

## 2019-07-27 NOTE — Telephone Encounter (Signed)
Pt takes Eliquis for afib with CHADS2VASc score of 5 (age x2, HTN, DM, CAD). SCr 1.65, CrCl 48.5. Pt is on Eliquis 5mg  BID, qualifies for reduced 2.5mg  BID dosing since he turned 78 and SCr > 1.5.   Recommend holding Eliquis for 3 days prior to spinal injection. Also recommend reducing Eliquis dose to 2.5mg  BID.

## 2019-07-27 NOTE — Telephone Encounter (Signed)
Pt has been made aware that it is ok to hold the Aspirin 5-7 days prior to surgery, whichever the surgeon's office recommended, but has been advised, per Dr. Burt Knack, to make sure to resume both antiplatelets as soon as surgeon gave the ok. Pt verbalized understanding. Aspirin added to pt's med list.

## 2019-07-27 NOTE — Telephone Encounter (Signed)
Clinical pharmacist to review 

## 2019-07-27 NOTE — Telephone Encounter (Signed)
Valetta Fuller, from [facility] called about patient holding his apixaban (ELIQUIS) 5 MG TABS tablet two prior to his study. Date of test TDB     [facility] Pre-operative Risk Assessment    Request for surgical clearance:  1. What type of surgery is being performed? Imaging   2. When is this surgery scheduled? TBD  3. What type of clearance is required (medical clearance vs. Pharmacy clearance to hold med vs. Both)? MED  4. Are there any medications that need to be held prior to surgery and how long?apixaban (ELIQUIS) 5 MG TABS tablet  5. Practice name and name of physician performing surgery? [facility]   6. What is your office phone number [phone]   7.   What is your office fax number  8.   Anesthesia type (None, local, MAC, general) ? None    Ermelinda Das 07/27/2019, 10:23 AM  _________________________________________________________________   (provider comments below)

## 2019-07-27 NOTE — Telephone Encounter (Signed)
Please contact requesting provider and specify the type of procedure and also return fax number

## 2019-07-27 NOTE — Telephone Encounter (Signed)
Agree - ok to hold ASA for procedure, then should resume when ok'd to restart after the procedure. With his hx of SVG stenting, best to be on antiplatelet Rx. thx

## 2019-07-27 NOTE — Telephone Encounter (Signed)
   Primary Cardiologist: No primary care provider on file.  Chart reviewed as part of pre-operative protocol coverage. Given past medical history and time since last visit, based on ACC/AHA guidelines, Pisek would be at acceptable risk for the planned procedure without further cardiovascular testing.   I will route this recommendation to the requesting party via Epic fax function and remove from pre-op pool.  Please call with questions.  Per our clinical pharmacist, he will hold eliquis for 3 days prior to the procedure and restart as soon as possible afterward at the discretion of the surgeon. He will also need to hold aspirin for 7 days prior to the procedure. (although aspirin is not listed as part of his medication, he says he has been taking it). Also, he has been instructed to start on a lower dose of eliquis 2.5mg  BID as he just reached age 78 and his Cr is >1.5.  [place], [place] 07/27/2019, 2:12 PM

## 2019-07-27 NOTE — Telephone Encounter (Signed)
Procedure: Spinal injection : myelogram   Fax [phone]

## 2019-08-01 ENCOUNTER — Ambulatory Visit (INDEPENDENT_AMBULATORY_CARE_PROVIDER_SITE_OTHER): Payer: Medicare Other | Admitting: Podiatry

## 2019-08-01 ENCOUNTER — Encounter: Payer: Self-pay | Admitting: Podiatry

## 2019-08-01 ENCOUNTER — Other Ambulatory Visit: Payer: Self-pay

## 2019-08-01 DIAGNOSIS — Z7901 Long term (current) use of anticoagulants: Secondary | ICD-10-CM

## 2019-08-01 DIAGNOSIS — B351 Tinea unguium: Secondary | ICD-10-CM

## 2019-08-01 DIAGNOSIS — E1142 Type 2 diabetes mellitus with diabetic polyneuropathy: Secondary | ICD-10-CM | POA: Diagnosis not present

## 2019-08-01 NOTE — Progress Notes (Signed)
Complaint:  Visit Type: Patient returns to my office for continued preventative foot care services. Complaint: Patient states" my nails have grown long and thick and become painful to walk and wear shoes" Patient has been diagnosed with DM with no foot complications. The patient presents for preventative foot care services. No changes to ROS  Podiatric Exam: Vascular: dorsalis pedis and posterior tibial pulses are palpable bilateral. Capillary return is immediate. Temperature gradient is WNL. Skin turgor WNL  Sensorium: Diminished  Semmes Weinstein monofilament test. Normal tactile sensation bilaterally. Nail Exam: Pt has thick disfigured discolored nails with subungual debris noted bilateral entire nail hallux through fifth toenails Ulcer Exam: There is no evidence of ulcer or pre-ulcerative changes or infection. Orthopedic Exam: Muscle tone and strength are WNL. No limitations in general ROM. No crepitus or effusions noted. Foot type and digits show no abnormalities. Bony prominences fifth metabase right foot. Skin: No Porokeratosis. No infection or ulcers  Diagnosis:  Onychomycosis, , Pain in right toe, pain in left toes  Treatment & Plan Procedures and Treatment: Consent by patient was obtained for treatment procedures.   Debridement of mycotic and hypertrophic toenails, 1 through 5 bilateral and clearing of subungual debris. No ulceration, no infection noted.  Return Visit-Office Procedure: Patient instructed to return to the office for a follow up visit 3 months for continued evaluation and treatment.    Gardiner Barefoot DPM

## 2019-08-03 MED ORDER — APIXABAN 2.5 MG PO TABS: 2.5000 mg | ORAL_TABLET | Freq: Two times a day (BID) | ORAL | 3 refills | Status: AC

## 2019-08-03 NOTE — Addendum Note (Signed)
Addended by: Diana Eves on: 08/03/2019 08:57 AM   Modules accepted: Orders

## 2019-08-03 NOTE — Telephone Encounter (Addendum)
Rx for Eliquis 2.5 mg BID printed for Isaac Laud to sign. Will be mailed to patient. Patient notified of change.

## 2019-08-08 ENCOUNTER — Ambulatory Visit
Admission: RE | Admit: 2019-08-08 | Discharge: 2019-08-08 | Disposition: A | Discharge status: Home / Self Care | Payer: Medicare Other | Source: Ambulatory Visit | Attending: Family Medicine | Admitting: Family Medicine

## 2019-08-08 ENCOUNTER — Other Ambulatory Visit: Payer: Self-pay

## 2019-08-08 ENCOUNTER — Ambulatory Visit (INDEPENDENT_AMBULATORY_CARE_PROVIDER_SITE_OTHER): Payer: Medicare Other | Admitting: Internal Medicine

## 2019-08-08 ENCOUNTER — Encounter: Payer: Self-pay | Admitting: Internal Medicine

## 2019-08-08 VITALS — BP 146/68 | HR 67 | Ht 74.0 in | Wt 204.0 lb

## 2019-08-08 DIAGNOSIS — I4819 Other persistent atrial fibrillation: Secondary | ICD-10-CM

## 2019-08-08 DIAGNOSIS — G8929 Other chronic pain: Secondary | ICD-10-CM

## 2019-08-08 DIAGNOSIS — I25110 Atherosclerotic heart disease of native coronary artery with unstable angina pectoris: Secondary | ICD-10-CM

## 2019-08-08 DIAGNOSIS — M48061 Spinal stenosis, lumbar region without neurogenic claudication: Secondary | ICD-10-CM | POA: Diagnosis not present

## 2019-08-08 DIAGNOSIS — M4316 Spondylolisthesis, lumbar region: Secondary | ICD-10-CM | POA: Diagnosis not present

## 2019-08-08 DIAGNOSIS — M545 Low back pain, unspecified: Secondary | ICD-10-CM

## 2019-08-08 IMAGING — CT CT L SPINE W/ CM
1 of 6 series · 7 of 14 positions shown, 9 images · non-contrast
Comparison: Lumbar spine x-rays dated [DATE]. CT abdomen
pelvis dated [DATE].

CLINICAL DATA: Intermittent right buttock and posterior thigh pain.
TECHNIQUE: Contiguous axial images were obtained through the lumbar spine after
the intrathecal infusion of contrast. Coronal and sagittal
reconstructions were obtained of the axial image sets.

[Series 3: l spine soft · axial · 0.38mm/px · z∈[-282,-84]mm · 7 of 89 slices shown, 9 images]
[im 12/89  soft-tissue]
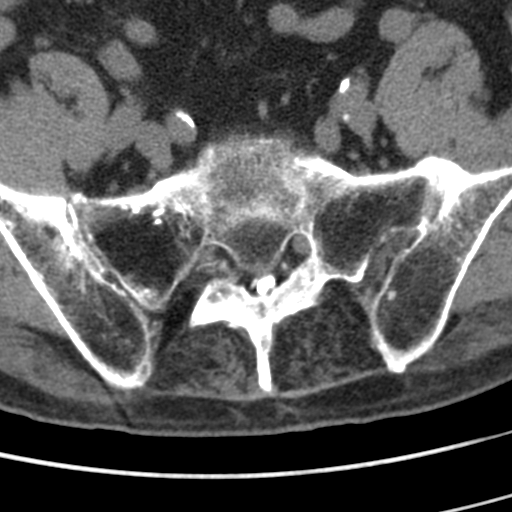
[im 12/89  bone]
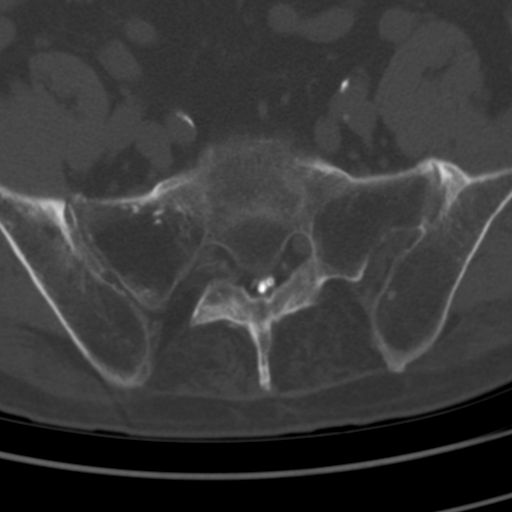
[im 23/89  bone]
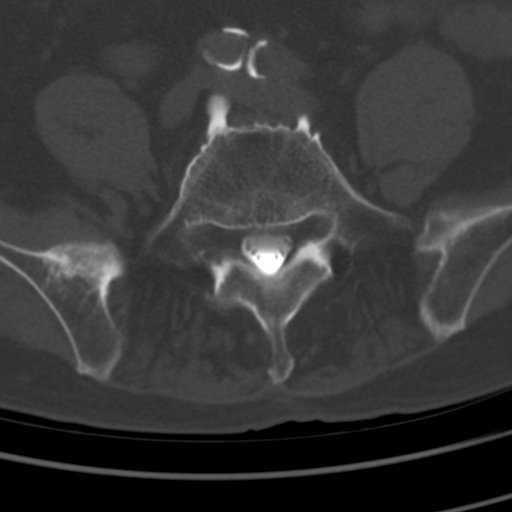
[im 34/89  bone]
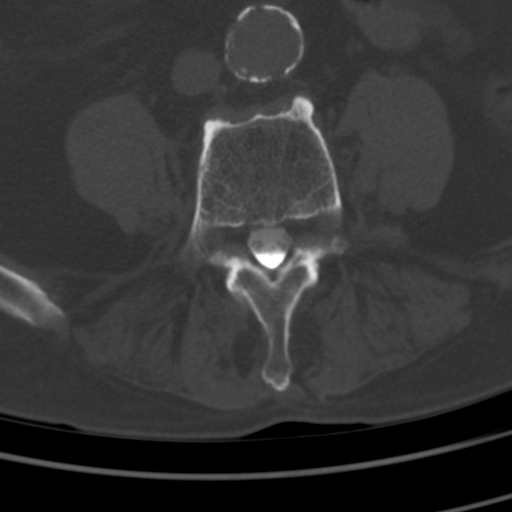
[im 45/89  bone]
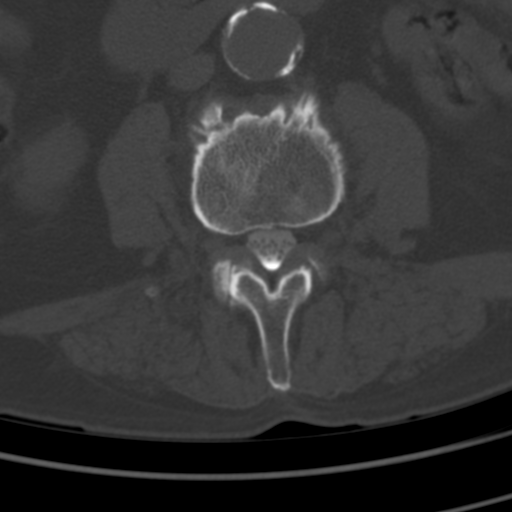
[im 56/89  soft-tissue]
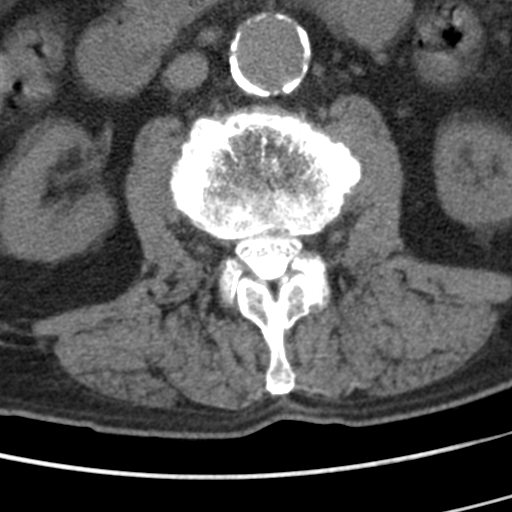
[im 56/89  bone]
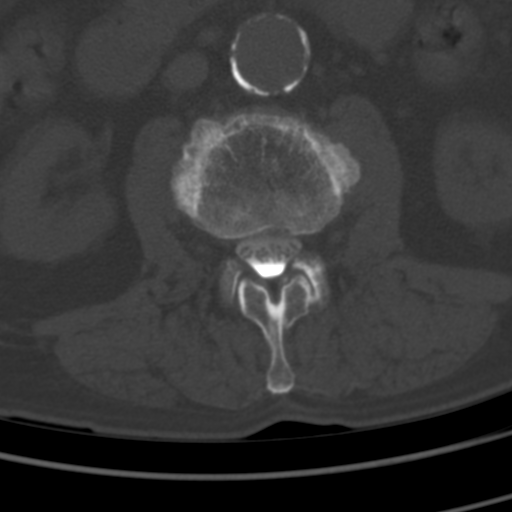
[im 67/89  bone]
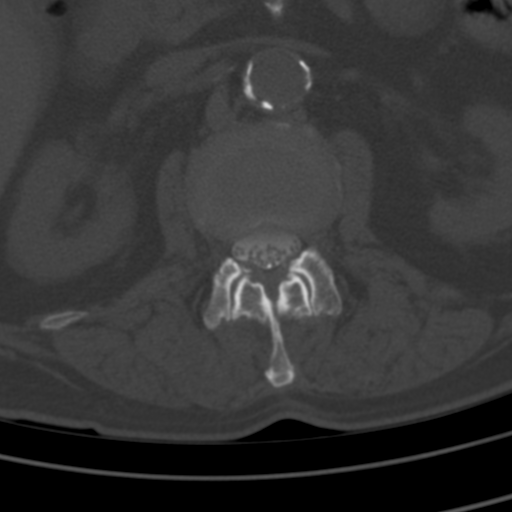
[im 78/89  bone]
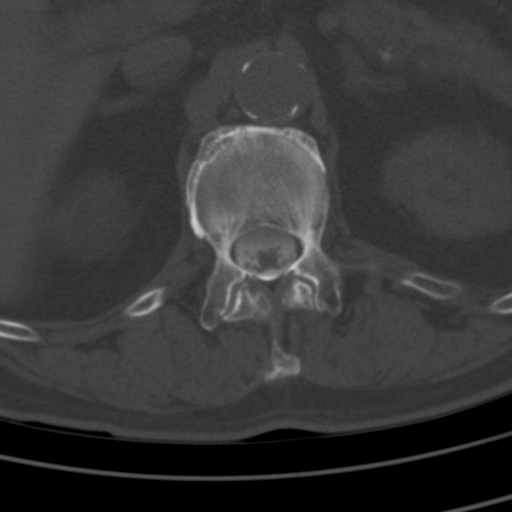

[7 of 14 positions shown; findings below may reference images not displayed]

EXAM:
LUMBAR MYELOGRAM

CT LUMBAR MYELOGRAM

FLUOROSCOPY TIME:  Radiation Exposure Index (as provided by the
fluoroscopic device): 12.8 mGy

Fluoroscopy Time:  16 seconds

Number of Acquired Images:  16

PROCEDURE:
After thorough discussion of risks and benefits of the procedure
including bleeding, infection, injury to nerves, blood vessels,
adjacent structures as well as headache and CSF leak, written and
oral informed consent was obtained. Consent was obtained by Dr.
DEZMOND. Time out form was completed.

Patient was positioned prone on the fluoroscopy table. Local
anesthesia was provided with 1% lidocaine without epinephrine after
prepped and draped in the usual sterile fashion. Puncture was
performed at L5-S1 using a 3 1/2 inch 22-gauge spinal needle via
right interlaminar approach. Using a single pass through the dura,
the needle was placed within the thecal sac, with return of clear
CSF. 15 mL of Isovue [C2] was injected into the thecal sac, with
normal opacification of the nerve roots and cauda equina consistent
with free flow within the subarachnoid space.

I personally performed the lumbar puncture and administered the
intrathecal contrast. I also personally supervised acquisition of
the myelogram images.
FINDINGS: LUMBAR MYELOGRAM FINDINGS:

3 mm retrolisthesis at L2-L3 and L3-L4. This increases to 4 mm at
L3-L4 with standing, flexion, and extension. Small ventral
extradural defects at L2-L3 and L3-L4. Moderate spinal canal
stenosis at L3-L4 improves with flexion. Effacement of the right L5
nerve root in the lateral recess at L4-L5.

CT LUMBAR MYELOGRAM FINDINGS:

Segmentation: Standard.

Alignment: Straightening of the normal lumbar lordosis. 3 mm
retrolisthesis at L2-L3 and L3-L4.

Vertebrae: No acute fracture or other focal pathologic process. Old
healed fractures of the right L2 and L3 transverse processes.

Conus medullaris and cauda equina: Conus extends to the L1 level.
Conus and cauda equina appear normal.

Paraspinal and other soft tissues: Aortic atherosclerosis. Unchanged
infrarenal abdominal aortic aneurysm measuring up to 3.5 cm.
Ossification of the left iliolumbar ligament.

Disc levels:

T12-L1:  Negative.

L1-L2: Negative disc. Mild bilateral facet arthropathy with
ligamentum flavum hypertrophy. No stenosis.

L2-L3:  Small circumferential disc osteophyte complex.  No stenosis.

L3-L4: Small circumferential disc osteophyte complex. Moderate right
and mild left facet arthropathy with ligamentum flavum hypertrophy.
Moderate spinal canal and right lateral recess stenosis. Mild left
lateral recess stenosis. Mild right neuroforaminal stenosis. No left
neuroforaminal stenosis.

L4-L5: Small circumferential disc osteophyte complex.
Mild-to-moderate bilateral facet arthropathy. Focal spurring in the
right lateral recess resulting in moderate stenosis. No spinal canal
or neuroforaminal stenosis.

L5-S1: Partial interbody ankylosis. Small circumferential disc
osteophyte complex. Mild bilateral facet arthropathy. No stenosis.
IMPRESSION: 1. Multilevel lumbar spondylosis as described above. Moderate spinal
canal and right lateral recess stenosis at L3-L4, which improves
with flexion.
2. Focal spurring in the right lateral recess at L4-L5 results in
moderate stenosis potentially affecting the descending right L5
nerve root.
3. Unchanged 3.5 cm infrarenal abdominal aortic aneurysm. Recommend
followup by ultrasound in 2 years. This recommendation follows ACR
consensus guidelines: White Paper of the ACR Incidental Findings
Committee II on Vascular Findings. [HOSPITAL] [C2];
[DATE]. Aortic aneurysm NOS ([C2]-[C2])
4.  Aortic atherosclerosis ([C2]-[C2]).

## 2019-08-08 IMAGING — XA DG MYELOGRAPHY LUMBAR INJ LUMBOSACRAL
13 of 17 series · 13 of 17 positions shown · non-contrast
Comparison: Lumbar spine x-rays dated [DATE]. CT abdomen
pelvis dated [DATE].

CLINICAL DATA: Intermittent right buttock and posterior thigh pain.
TECHNIQUE: Contiguous axial images were obtained through the lumbar spine after
the intrathecal infusion of contrast. Coronal and sagittal
reconstructions were obtained of the axial image sets.

[Series 1: w lumbar spine lat · 0.15mm/px · 1 of 1 slices shown]
[im 1/1]
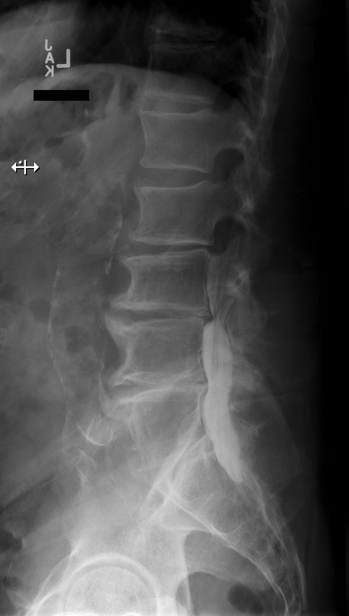

[Series 1: vasc standard · 1 of 1 slices shown (1 of 10)]
[im 1/1]
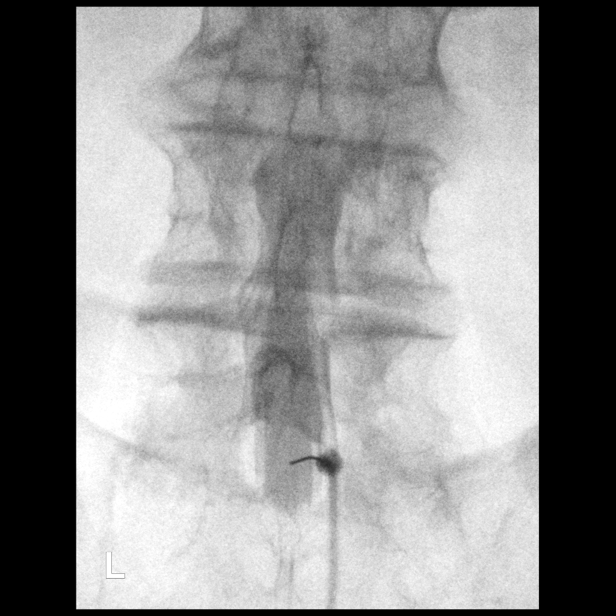

[Series 2: w lumbar spine flexion · 0.15mm/px · 1 of 1 slices shown]
[im 1/1]
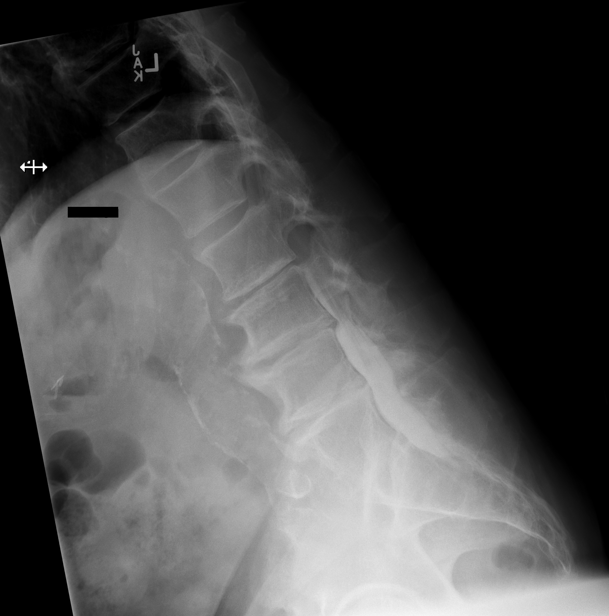

[Series 3: w lumbar spine extension · 0.15mm/px · 1 of 1 slices shown]
[im 1/1]
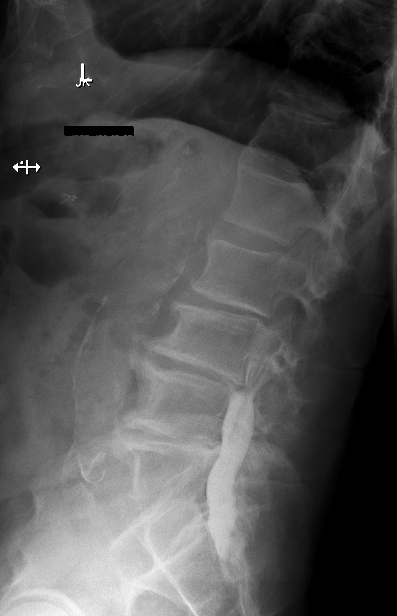

[Series 3: vasc standard · 1 of 1 slices shown (2 of 10)]
[im 1/1]
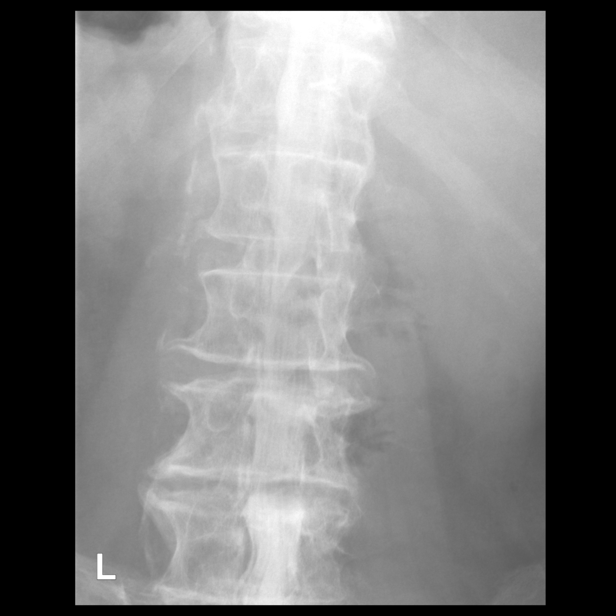

[Series 5: vasc standard · 1 of 1 slices shown (3 of 10)]
[im 1/1]
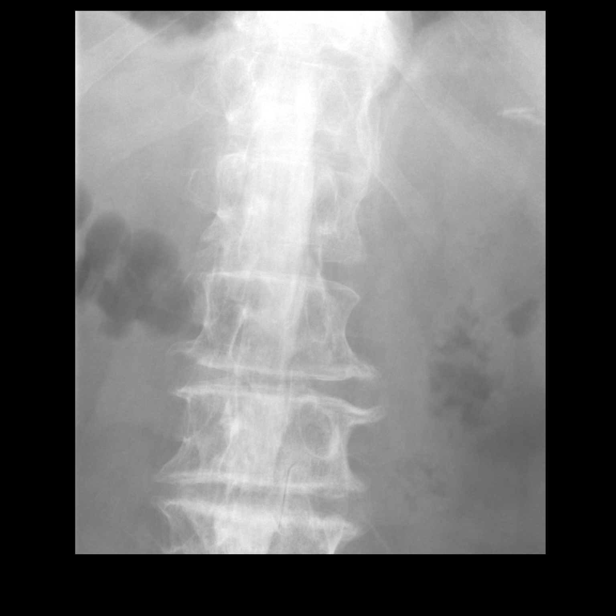

[Series 6: vasc standard · 1 of 1 slices shown (4 of 10)]
[im 1/1]
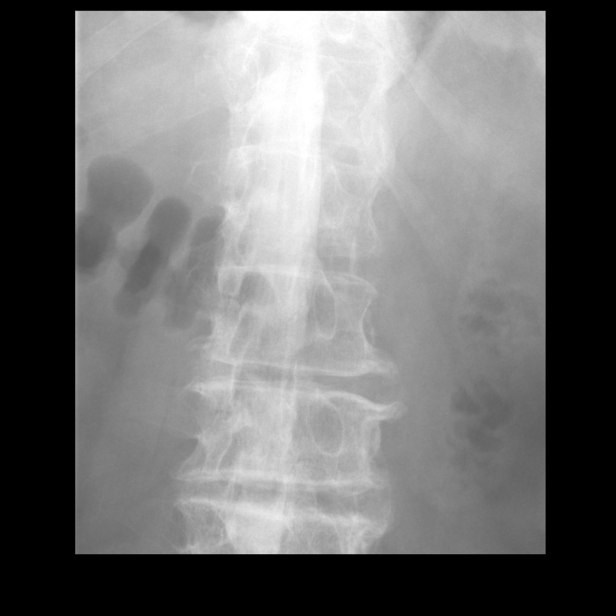

[Series 7: vasc standard · 1 of 1 slices shown (5 of 10)]
[im 1/1]
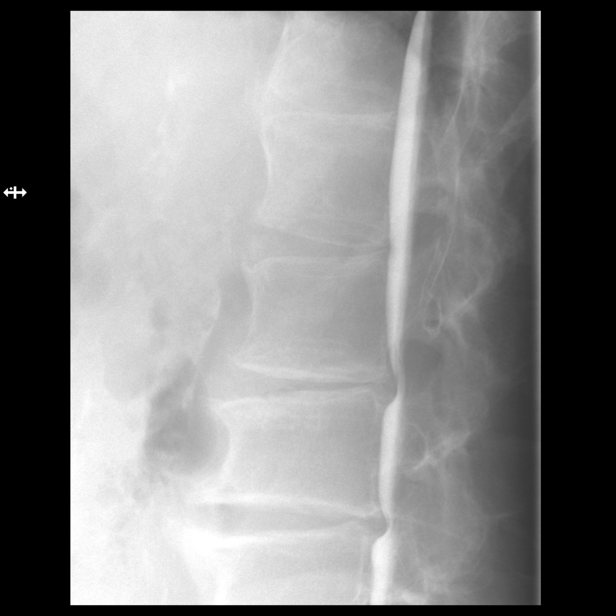

[Series 9: vasc standard · 1 of 1 slices shown (6 of 10)]
[im 1/1]
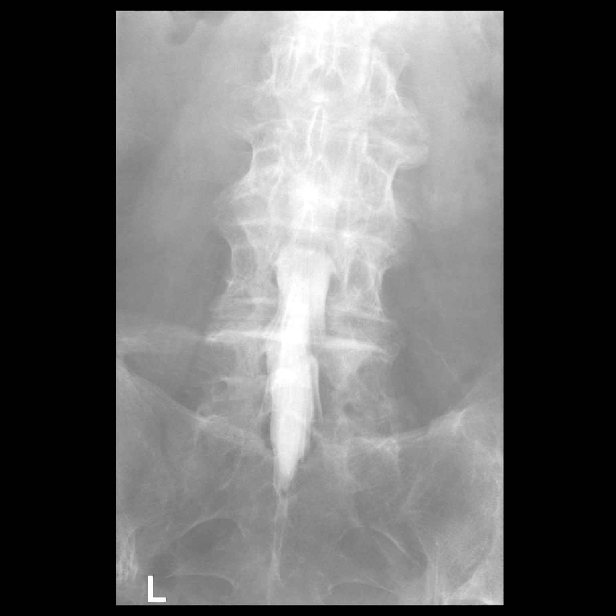

[Series 10: vasc standard · 1 of 1 slices shown (7 of 10)]
[im 1/1]
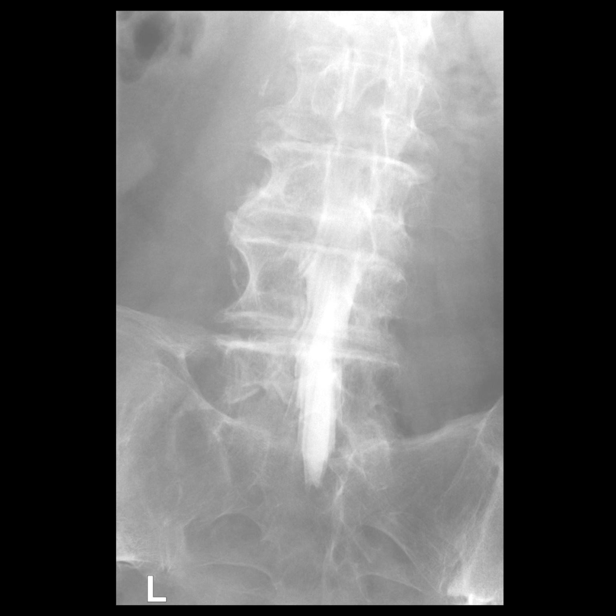

[Series 11: vasc standard · 1 of 1 slices shown (8 of 10)]
[im 1/1]
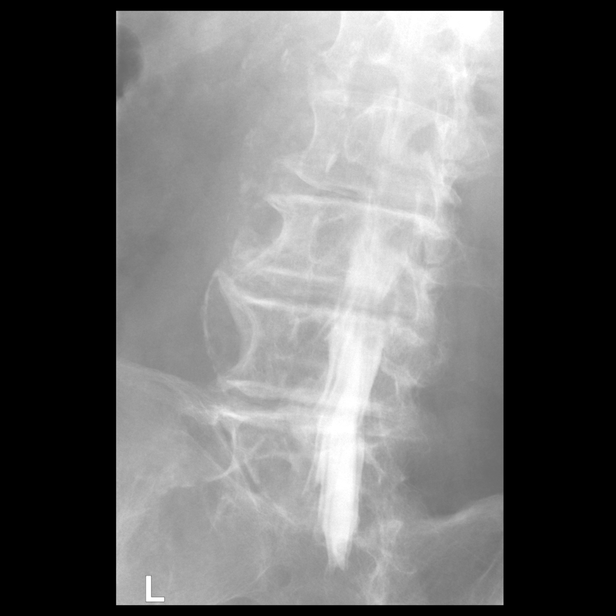

[Series 13: vasc standard · 1 of 1 slices shown (9 of 10)]
[im 1/1]
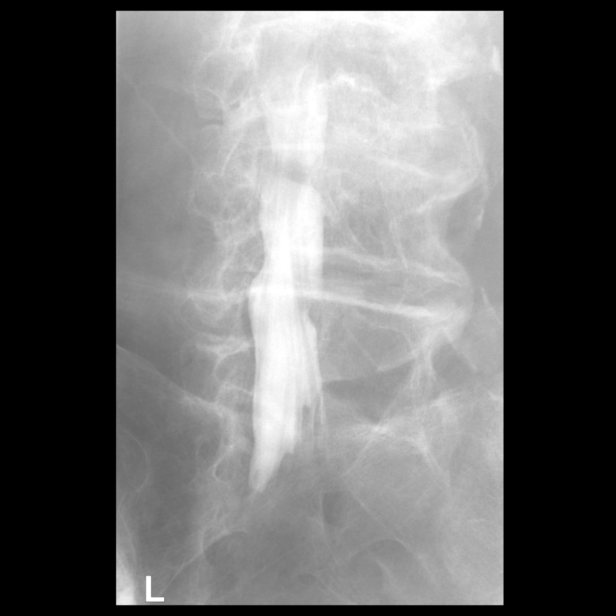

[Series 14: vasc standard · 1 of 1 slices shown (10 of 10)]
[im 1/1]
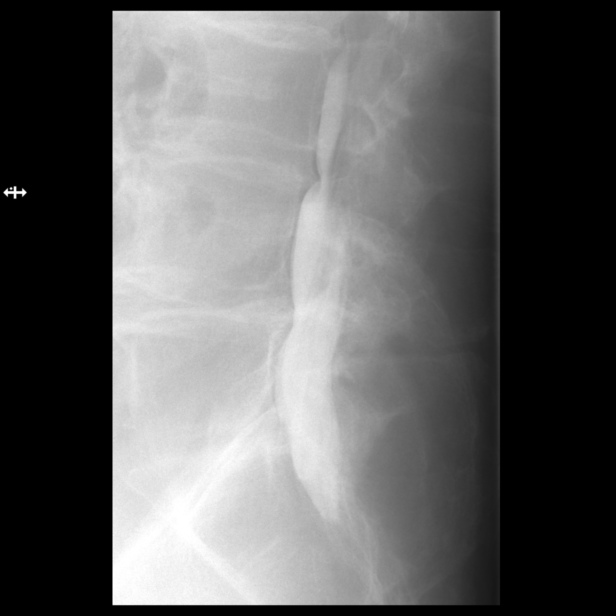

[13 of 17 positions shown; findings below may reference images not displayed]

EXAM:
LUMBAR MYELOGRAM

CT LUMBAR MYELOGRAM

FLUOROSCOPY TIME:  Radiation Exposure Index (as provided by the
fluoroscopic device): 12.8 mGy

Fluoroscopy Time:  16 seconds

Number of Acquired Images:  16

PROCEDURE:
After thorough discussion of risks and benefits of the procedure
including bleeding, infection, injury to nerves, blood vessels,
adjacent structures as well as headache and CSF leak, written and
oral informed consent was obtained. Consent was obtained by Dr.
DEZMOND. Time out form was completed.

Patient was positioned prone on the fluoroscopy table. Local
anesthesia was provided with 1% lidocaine without epinephrine after
prepped and draped in the usual sterile fashion. Puncture was
performed at L5-S1 using a 3 1/2 inch 22-gauge spinal needle via
right interlaminar approach. Using a single pass through the dura,
the needle was placed within the thecal sac, with return of clear
CSF. 15 mL of Isovue [C2] was injected into the thecal sac, with
normal opacification of the nerve roots and cauda equina consistent
with free flow within the subarachnoid space.

I personally performed the lumbar puncture and administered the
intrathecal contrast. I also personally supervised acquisition of
the myelogram images.
FINDINGS: LUMBAR MYELOGRAM FINDINGS:

3 mm retrolisthesis at L2-L3 and L3-L4. This increases to 4 mm at
L3-L4 with standing, flexion, and extension. Small ventral
extradural defects at L2-L3 and L3-L4. Moderate spinal canal
stenosis at L3-L4 improves with flexion. Effacement of the right L5
nerve root in the lateral recess at L4-L5.

CT LUMBAR MYELOGRAM FINDINGS:

Segmentation: Standard.

Alignment: Straightening of the normal lumbar lordosis. 3 mm
retrolisthesis at L2-L3 and L3-L4.

Vertebrae: No acute fracture or other focal pathologic process. Old
healed fractures of the right L2 and L3 transverse processes.

Conus medullaris and cauda equina: Conus extends to the L1 level.
Conus and cauda equina appear normal.

Paraspinal and other soft tissues: Aortic atherosclerosis. Unchanged
infrarenal abdominal aortic aneurysm measuring up to 3.5 cm.
Ossification of the left iliolumbar ligament.

Disc levels:

T12-L1:  Negative.

L1-L2: Negative disc. Mild bilateral facet arthropathy with
ligamentum flavum hypertrophy. No stenosis.

L2-L3:  Small circumferential disc osteophyte complex.  No stenosis.

L3-L4: Small circumferential disc osteophyte complex. Moderate right
and mild left facet arthropathy with ligamentum flavum hypertrophy.
Moderate spinal canal and right lateral recess stenosis. Mild left
lateral recess stenosis. Mild right neuroforaminal stenosis. No left
neuroforaminal stenosis.

L4-L5: Small circumferential disc osteophyte complex.
Mild-to-moderate bilateral facet arthropathy. Focal spurring in the
right lateral recess resulting in moderate stenosis. No spinal canal
or neuroforaminal stenosis.

L5-S1: Partial interbody ankylosis. Small circumferential disc
osteophyte complex. Mild bilateral facet arthropathy. No stenosis.
IMPRESSION: 1. Multilevel lumbar spondylosis as described above. Moderate spinal
canal and right lateral recess stenosis at L3-L4, which improves
with flexion.
2. Focal spurring in the right lateral recess at L4-L5 results in
moderate stenosis potentially affecting the descending right L5
nerve root.
3. Unchanged 3.5 cm infrarenal abdominal aortic aneurysm. Recommend
followup by ultrasound in 2 years. This recommendation follows ACR
consensus guidelines: White Paper of the ACR Incidental Findings
Committee II on Vascular Findings. [HOSPITAL] [C2];
[DATE]. Aortic aneurysm NOS ([C2]-[C2])
4.  Aortic atherosclerosis ([C2]-[C2]).

## 2019-08-08 MED ORDER — IOPAMIDOL (ISOVUE-M 200) INJECTION 41%
20.0000 mL | Freq: Once | INTRAMUSCULAR | Status: AC
  Administered 2019-08-08: 20 mL via INTRATHECAL

## 2019-08-08 MED ORDER — DIAZEPAM 5 MG PO TABS
5.0000 mg | ORAL_TABLET | Freq: Once | ORAL | Status: AC
  Administered 2019-08-08: 5 mg via ORAL

## 2019-08-08 NOTE — Progress Notes (Signed)
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 He underwent ablation almost 2 years ago. He had a marked reduction in his PVC's from 25% down to 7%. His weight was down 20 lbs when I saw him a year ago. Since then he has lost 8 lbs. He feels well. His dose of Eliquis was reduced from 5 to 2.5 bid. He has not had any symptomatic arrhythmias. No edema and no chest pain. No syncope.  Allergies  Allergen Reactions  . Lisinopril Swelling and Other (See Comments)    Angioedema.   . Losartan Swelling and Other (See Comments)    Lips swell Angioedema   . Nifedipine Swelling and Other (See Comments)    Sugar increase  . Triamterene Swelling  . Hydralazine Hcl Other (See Comments)    Fatigue; poor appetite  . Amoxicillin Rash and Other (See Comments)    Has patient had a PCN reaction causing immediate rash, facial/tongue/throat swelling, SOB or lightheadedness with hypotension:No Has patient had a PCN reaction causing severe rash involving mucus membranes or skin necrosis:No Has patient had a PCN reaction that required hospitalization:No Has patient had a PCN reaction occurring within the last 10 years: No If all of the above answers are "NO", then may proceed with Cephalosporin use.   . Ampicillin Rash and Other (See Comments)    Has patient had a PCN reaction causing immediate rash, facial/tongue/throat swelling, SOB or lightheadedness with hypotension:No Has patient had a PCN reaction causing severe rash involving mucus membranes or skin necrosis:No Has patient had a PCN reaction that required hospitalization:No Has patient had a PCN reaction occurring within the last 10 years: No If all of the above answers are "NO", then may proceed with Cephalosporin use.    . Carvedilol Other (See Comments)    Low heart rate      Current Outpatient Medications  Medication Sig Dispense Refill  . amLODipine (NORVASC) 10 MG tablet Take 1 tablet (10 mg total) by mouth daily. 30 tablet  11  . apixaban (ELIQUIS) 2.5 MG TABS tablet Take 1 tablet (2.5 mg total) by mouth 2 (two) times daily. (0800 & 2000) 180 tablet 3  . aspirin EC 81 MG tablet Take 1 tablet (81 mg total) by mouth daily. 90 tablet 3  . atorvastatin (LIPITOR) 40 MG tablet Take 40 mg by mouth at bedtime.     . cholecalciferol (VITAMIN D) 1000 UNITS tablet Take 1,000 Units by mouth every evening.     . cloNIDine (CATAPRES) 0.1 MG tablet Take 1 tablet (0.1 mg total) by mouth 2 (two) times daily. 180 tablet 3  . dofetilide (TIKOSYN) 250 MCG capsule Take 1 capsule (250 mcg total) by mouth 2 (two) times daily. 60 capsule 6  . gabapentin (NEURONTIN) 100 MG capsule Take 2 capsules (200 mg total) by mouth at bedtime. (Patient taking differently: Take 200 mg by mouth daily as needed. ) 60 capsule 3  . glucose blood test strip Accu-Check Guide Strips. Dx E11.9 Use to test blood glucose level 2 times a day. 50 each 11  . magnesium oxide (MAG-OX) 400 MG tablet Take 1 tablet (400 mg total) by mouth 4 (four) times daily. (Patient taking differently: Take 800 mg by mouth 2 (two) times daily. ) 360 tablet 3  . metFORMIN (GLUCOPHAGE) 1000 MG tablet TAKE 1 TABLET BY MOUTH TWICE DAILY WITH  A MEAL 180 tablet 3  . nitroGLYCERIN (NITROSTAT) 0.4 MG SL tablet Place 1 tablet (0.4 mg  total) under the tongue every 5 (five) minutes as needed for chest pain (x 3 doses). 30 tablet 3  . propranolol (INDERAL) 20 MG tablet Take one tablet by mouth PRN for irregular heartbeat.  Maximum 2 tablets in 24 hours. 30 tablet 3  . terazosin (HYTRIN) 1 MG capsule Take 1 mg by mouth at bedtime. (2100) 30 capsule 11  . vitamin C (ASCORBIC ACID) 500 MG tablet Take 500 mg by mouth daily.     . pantoprazole (PROTONIX) 40 MG tablet Take 1 tablet (40 mg total) by mouth daily. 90 tablet 3   No current facility-administered medications for this visit.      Past Medical History:  Diagnosis Date  . AAA (abdominal aortic aneurysm) ([place])   . Arthritis   . Cancer  (H[facility])    skin & squamous cell  . Carotid artery disease ([place])    a. s/p R CEA 12/2015.  [name] CKD (chronic kidney disease), stage III ([place])    a. per historical labs.  . Coronary artery disease    a. s/p CABG 2004.  . Diabetes mellitus without complication ([place])   . GERD (gastroesophageal reflux disease)   . Hyperlipidemia   . Hypertension   . Medication intolerance Multiple   . PAF (paroxysmal atrial fibrillation) ([place])   . Pneumonia   . Sinus bradycardia    a. baseline HR 50s-60s, also h/o bradycardia on metoprolol and carvedilol    ROS:   All systems reviewed and negative except as noted in the HPI.   Past Surgical History:  Procedure Laterality Date  . ANKLE SURGERY     right fused  . BACK SURGERY     low back   . CHOLECYSTECTOMY N/A 12/07/2013   Procedure: LAPAROSCOPIC CHOLECYSTECTOMY ;  Surgeon: [name], MD;  Location: [place];  Service: General;  Laterality: N/A;  . COLONOSCOPY    . CORONARY ARTERY BYPASS GRAFT  2004   [place]  . CORONARY STENT INTERVENTION N/A 05/21/2017   Procedure: Coronary Stent Intervention;  Surgeon: [name], MD;  Location: [place] [facility];  Service: Cardiovascular;  Laterality: N/A;  . ENDARTERECTOMY Right 12/10/2015   Procedure: Right Carotid ENDARTERECTOMY;  Surgeon: Conrad Ragsdale, MD;  Location: [place];  Service: Vascular;  Laterality: Right;  . EXPLORATION POST OPERATIVE OPEN HEART    . FINGER SURGERY     skin graft on rt index  . FINGER SURGERY Right    right index finger.  [name] HERNIA REPAIR Right    [facility]  . JOINT REPLACEMENT    . KNEE SURGERY     replacement on both knees  . LEFT HEART CATH AND CORS/GRAFTS ANGIOGRAPHY N/A 05/21/2017   Procedure: Left Heart Cath and Cors/Grafts Angiography;  Surgeon: [name], MD;  Location: [place] [facility];  Service: Cardiovascular;  Laterality: N/A;  . PROSTATE SURGERY    . PVC ABLATION N/A 10/27/2017   Procedure: PVC ABLATION;  Surgeon: Evans Lance, MD;  Location: [place] [facility];  Service: Cardiovascular;  Laterality: N/A;  . TONSILLECTOMY    . URINARY SURGERY     scar tissue     Family History  Problem Relation Age of Onset  . Stroke Mother   . Heart disease Mother   . Heart disease Father   . Hypertension Father   . Hyperlipidemia Father   . Cancer Sister        breast  and skin  . Diabetes Sister   .  Hypertension Sister   . Diabetes Brother   . Heart disease Brother   . Hyperlipidemia Brother   . Hypertension Brother   . Early death Neg Hx      Social History   Socioeconomic History  . Marital status: Married    Spouse name: Not on file  . Number of children: 2  . Years of education: Not on file  . Highest education level: Not on file  Occupational History  . Not on file  Social Needs  . Financial resource strain: Not hard at all  . Food insecurity    Worry: Never true    Inability: Never true  . Transportation needs    Medical: No    Non-medical: No  Tobacco Use  . Smoking status: Former Smoker    Packs/day: 1.00    Years: 13.00    Pack years: 13.00    Quit date: 11/30/1970    Years since quitting: 48.7  . Smokeless tobacco: Never Used  Substance and Sexual Activity  . Alcohol use: No    Alcohol/week: 0.0 standard drinks  . Drug use: No  . Sexual activity: Yes  Lifestyle  . Physical activity    Days per week: 0 days    Minutes per session: 0 min  . Stress: Not at all  Relationships  . Social connections    Talks on phone: More than three times a week    Gets together: More than three times a week    Attends religious service: More than 4 times per year    Active member of club or organization: Yes    Attends meetings of clubs or organizations: More than 4 times per year    Relationship status: Married  . Intimate partner violence    Fear of current or ex partner: No    Emotionally abused: No    Physically abused: No    Forced sexual activity: No  Other Topics Concern  . Not on file  Social History  Narrative  . Not on file     BP (!) 146/68   Pulse 67   Ht 6\' 2"  (1.88 m)   Wt 204 lb (92.5 kg)   SpO2 99%   BMI 26.19 kg/m   Physical Exam:  Well appearing 78 yo man, NAD HEENT: Unremarkable Neck:  No JVD, no thyromegally Lymphatics:  No adenopathy Back:  No CVA tenderness Lungs:  Clear with no wheezes HEART:  Regular rate rhythm, no murmurs, no rubs, no clicks Abd:  soft, positive bowel sounds, no organomegally, no rebound, no guarding Ext:  2 plus pulses, no edema, no cyanosis, no clubbing Skin:  No rashes no nodules Neuro:  CN II through XII intact, motor grossly intact  EKG - nsr with RBBB   Assess/Plan: 1. PAF - he is maintaining NSR on dofetilide. 2. PVC's - he has had no symptoms s/p catheter ablation and his ECG today shows no PVC's. I did not appreciate any on exam. 3. Weight loss - he was in the 230 range and over 2 years has lost almost 30 lbs.  4. CAD - he denies anginal symptoms. He is encouraged to remain active.  Thomas Marshall.D.

## 2019-08-08 NOTE — Discharge Instructions (Signed)

## 2019-08-08 NOTE — Patient Instructions (Signed)
Medication Instructions:  none If you need a refill on your cardiac medications before your next appointment, please call your pharmacy.   Lab work: none If you have labs (blood work) drawn today and your tests are completely normal, you will receive your results only by: [name] MyChart Message (if you have MyChart) OR . A paper copy in the mail If you have any lab test that is abnormal or we need to change your treatment, we will call you to review the results.  Testing/Procedures: none  Follow-Up: At [facility], you and your health needs are our priority.  As part of our continuing mission to provide you with exceptional heart care, we have created designated Provider Care Teams.  These Care Teams include your primary Cardiologist (physician) and Advanced Practice Providers (APPs -  Physician Assistants and Nurse Practitioners) who all work together to provide you with the care you need, when you need it. You will need a follow up appointment in 1 years.  Please call our office 2 months in advance to schedule this appointment.  You may see Dr Lovena Le or one of the following Advanced Practice Providers on your designated Care Team:   Chanetta Marshall, NP . Tommye Standard, PA-C  Any Other Special Instructions Will Be Listed Below (If Applicable).

## 2019-08-22 DIAGNOSIS — L570 Actinic keratosis: Secondary | ICD-10-CM | POA: Diagnosis not present

## 2019-09-21 DIAGNOSIS — E785 Hyperlipidemia, unspecified: Secondary | ICD-10-CM | POA: Diagnosis not present

## 2019-09-21 DIAGNOSIS — I129 Hypertensive chronic kidney disease with stage 1 through stage 4 chronic kidney disease, or unspecified chronic kidney disease: Secondary | ICD-10-CM | POA: Diagnosis not present

## 2019-09-21 DIAGNOSIS — N1831 Chronic kidney disease, stage 3a: Secondary | ICD-10-CM | POA: Diagnosis not present

## 2019-09-21 DIAGNOSIS — R809 Proteinuria, unspecified: Secondary | ICD-10-CM | POA: Diagnosis not present

## 2019-09-21 DIAGNOSIS — E1122 Type 2 diabetes mellitus with diabetic chronic kidney disease: Secondary | ICD-10-CM | POA: Diagnosis not present

## 2019-09-21 DIAGNOSIS — N1832 Chronic kidney disease, stage 3b: Secondary | ICD-10-CM | POA: Diagnosis not present

## 2019-09-21 DIAGNOSIS — N179 Acute kidney failure, unspecified: Secondary | ICD-10-CM | POA: Diagnosis not present

## 2019-09-21 DIAGNOSIS — N189 Chronic kidney disease, unspecified: Secondary | ICD-10-CM | POA: Diagnosis not present

## 2019-09-21 DIAGNOSIS — D631 Anemia in chronic kidney disease: Secondary | ICD-10-CM | POA: Diagnosis not present

## 2019-10-13 NOTE — Progress Notes (Addendum)
Subjective:   Thomas Marshall is a 78 y.o. male who presents for Medicare Annual/Subsequent preventive examination.  Review of Systems:   Cardiac Risk Factors include: advanced age (>46men, >97 women);diabetes mellitus;hypertension;male gender Sleep patterns: feels rested on waking, gets up 1 times nightly to void and sleeps 8-9 hours nightly.    Home Safety/Smoke Alarms: Feels safe in home. Smoke alarms in place.  Living environment; residence and Firearm Safety: 1-story house/ trailer. Lives with wife, no needs for DME, good support system Seat Belt Safety/Bike Helmet: Wears seat belt.     Objective:    Vitals: BP (!) 146/68   Pulse 63   Ht 6\' 2"  (1.88 m)   Wt 207 lb (93.9 kg)   SpO2 98%   BMI 26.58 kg/m   Body mass index is 26.58 kg/m.  Advanced Directives 10/16/2019 10/12/2018 10/27/2017 09/16/2017 09/04/2017 05/21/2017 09/27/2016  Does Patient Have a Medical Advance Directive? Yes Yes Yes No No Yes No  Type of Paramedic of [place];Living will [place];Living will [place];Living will - - Living will -  Does patient want to make changes to medical advance directive? - - No - Patient declined - - No - Patient declined -  Copy of [place] in Chart? No - copy requested No - copy requested No - copy requested - - - -  Would patient like information on creating a medical advance directive? - - - Yes (ED - Information included in AVS) No - Patient declined - No - patient declined information  Pre-existing out of facility DNR order (yellow form or pink MOST form) - - - - - - -    Tobacco Social History   Tobacco Use  Smoking Status Former Smoker  . Packs/day: 1.00  . Years: 13.00  . Pack years: 13.00  . Quit date: 11/30/1970  . Years since quitting: 48.9  Smokeless Tobacco Never Used     Counseling given: Not Answered  Past Medical History:  Diagnosis Date  . AAA (abdominal aortic  aneurysm) ([place])   . Arthritis   . Cancer (HCC)    skin & squamous cell  . Carotid artery disease ([place])    a. s/p R CEA 12/2015.  [name] CKD (chronic kidney disease), stage III    a. per historical labs.  . Coronary artery disease    a. s/p CABG 2004.  . Diabetes mellitus without complication ([place])   . GERD (gastroesophageal reflux disease)   . Hyperlipidemia   . Hypertension   . Medication intolerance Multiple   . PAF (paroxysmal atrial fibrillation) ([place])   . Pneumonia   . Sinus bradycardia    a. baseline HR 50s-60s, also h/o bradycardia on metoprolol and carvedilol   Past Surgical History:  Procedure Laterality Date  . ANKLE SURGERY     right fused  . BACK SURGERY     low back   . CHOLECYSTECTOMY N/A 12/07/2013   Procedure: LAPAROSCOPIC CHOLECYSTECTOMY ;  Surgeon: [name], MD;  Location: [facility];  Service: General;  Laterality: N/A;  . COLONOSCOPY    . CORONARY ARTERY BYPASS GRAFT  2004   [place]  . CORONARY STENT INTERVENTION N/A 05/21/2017   Procedure: Coronary Stent Intervention;  Surgeon: [name], MD;  Location: [facility];  Service: Cardiovascular;  Laterality: N/A;  . ENDARTERECTOMY Right 12/10/2015   Procedure: Right Carotid ENDARTERECTOMY;  Surgeon: Conrad Lawler, MD;  Location: [facility] [facility];  Service: Vascular;  Laterality: Right;  . EXPLORATION POST OPERATIVE OPEN HEART    . FINGER SURGERY     skin graft on rt index  . FINGER SURGERY Right    right index finger.  [name] HERNIA REPAIR Right    RIH  . JOINT REPLACEMENT    . KNEE SURGERY     replacement on both knees  . LEFT HEART CATH AND CORS/GRAFTS ANGIOGRAPHY N/A 05/21/2017   Procedure: Left Heart Cath and Cors/Grafts Angiography;  Surgeon: [name], MD;  Location: [facility];  Service: Cardiovascular;  Laterality: N/A;  . PROSTATE SURGERY    . PVC ABLATION N/A 10/27/2017   Procedure: PVC ABLATION;  Surgeon: Evans Lance, MD;  Location: [facility];  Service: Cardiovascular;   Laterality: N/A;  . TONSILLECTOMY    . URINARY SURGERY     scar tissue   Family History  Problem Relation Age of Onset  . Stroke Mother   . Heart disease Mother   . Heart disease Father   . Hypertension Father   . Hyperlipidemia Father   . Cancer Sister        breast  and skin  . Diabetes Sister   . Hypertension Sister   . Diabetes Brother   . Heart disease Brother   . Hyperlipidemia Brother   . Hypertension Brother   . Early death Neg Hx    Social History   Socioeconomic History  . Marital status: Married    Spouse name: Not on file  . Number of children: 2  . Years of education: Not on file  . Highest education level: Not on file  Occupational History  . Occupation: retired  Scientific laboratory technician  . Financial resource strain: Not hard at all  . Food insecurity    Worry: Never true    Inability: Never true  . Transportation needs    Medical: No    Non-medical: No  Tobacco Use  . Smoking status: Former Smoker    Packs/day: 1.00    Years: 13.00    Pack years: 13.00    Quit date: 11/30/1970    Years since quitting: 48.9  . Smokeless tobacco: Never Used  Substance and Sexual Activity  . Alcohol use: No    Alcohol/week: 0.0 standard drinks  . Drug use: No  . Sexual activity: Yes  Lifestyle  . Physical activity    Days per week: 4 days    Minutes per session: 40 min  . Stress: Not at all  Relationships  . Social connections    Talks on phone: More than three times a week    Gets together: More than three times a week    Attends religious service: More than 4 times per year    Active member of club or organization: Yes    Attends meetings of clubs or organizations: More than 4 times per year    Relationship status: Married  Other Topics Concern  . Not on file  Social History Narrative  . Not on file    Outpatient Encounter Medications as of 10/16/2019  Medication Sig  . amLODipine (NORVASC) 10 MG tablet Take 1 tablet (10 mg total) by mouth daily.  [name] apixaban  (ELIQUIS) 2.5 MG TABS tablet Take 1 tablet (2.5 mg total) by mouth 2 (two) times daily. (0800 & 2000)  . aspirin EC 81 MG tablet Take 1 tablet (81 mg total) by mouth daily.  [name] atorvastatin (LIPITOR) 40 MG tablet Take 40  mg by mouth at bedtime.   . cholecalciferol (VITAMIN D) 1000 UNITS tablet Take 1,000 Units by mouth every evening.   . cloNIDine (CATAPRES) 0.1 MG tablet Take 1 tablet (0.1 mg total) by mouth 2 (two) times daily.  [name] dofetilide (TIKOSYN) 250 MCG capsule Take 1 capsule (250 mcg total) by mouth 2 (two) times daily.  [name] gabapentin (NEURONTIN) 100 MG capsule Take 2 capsules (200 mg total) by mouth at bedtime. (Patient taking differently: Take 200 mg by mouth daily as needed. )  . glucose blood test strip Accu-Check Guide Strips. Dx E11.9 Use to test blood glucose level 2 times a day.  . magnesium oxide (MAG-OX) 400 MG tablet Take 1 tablet (400 mg total) by mouth 4 (four) times daily. (Patient taking differently: Take 800 mg by mouth 2 (two) times daily. )  . metFORMIN (GLUCOPHAGE) 1000 MG tablet TAKE 1 TABLET BY MOUTH TWICE DAILY WITH  A MEAL  . nitroGLYCERIN (NITROSTAT) 0.4 MG SL tablet Place 1 tablet (0.4 mg total) under the tongue every 5 (five) minutes as needed for chest pain (x 3 doses).  . pantoprazole (PROTONIX) 40 MG tablet Take 1 tablet (40 mg total) by mouth daily.  . propranolol (INDERAL) 20 MG tablet Take one tablet by mouth PRN for irregular heartbeat.  Maximum 2 tablets in 24 hours.  [name] terazosin (HYTRIN) 1 MG capsule Take 1 mg by mouth at bedtime. (2100)  . vitamin C (ASCORBIC ACID) 500 MG tablet Take 500 mg by mouth daily.    No facility-administered encounter medications on file as of 10/16/2019.     Activities of Daily Living In your present state of health, do you have any difficulty performing the following activities: 10/16/2019  Hearing? N  Vision? N  Difficulty concentrating or making decisions? N  Walking or climbing stairs? N  Dressing or bathing? N   Doing errands, shopping? N  Preparing Food and eating ? N  Using the Toilet? N  In the past six months, have you accidently leaked urine? N  Do you have problems with loss of bowel control? N  Managing your Medications? N  Managing your Finances? N  Housekeeping or managing your Housekeeping? N  Some recent data might be hidden    Patient Care Team: [name], MD as PCP - General (Internal Medicine) [name], MD as Consulting Physician (Cardiology) [name], MD as Consulting Physician (General Surgery) Lyndal Pulley, DO as Consulting Physician (Family Medicine) Gardiner Barefoot, DPM as Consulting Physician (Podiatry)   Assessment:   This is a routine wellness examination for [place]. Physical assessment deferred to PCP.  Exercise Activities and Dietary recommendations Current Exercise Habits: The patient does not participate in regular exercise at present(physically active maintaining home)  Diet (meal preparation, eat out, water intake, caffeinated beverages, dairy products, fruits and vegetables): in general, a "healthy" diet  , well balanced   Reviewed heart healthy and diabetic diet. Encouraged patient to increase daily water and healthy fluid intake.  Goals    . Get back to walking as soon as I get my heart situated     I want to get back to mowing my yard asap    . Patient Stated     Stay as healthy and as independent as possible. Love family and enjoy life.       Fall Risk Fall Risk  10/16/2019 10/16/2019 04/18/2019 10/12/2018 03/31/2018  Falls in the past year? 0 0 0 0 No  Number falls in past  yr: 0 - - - -  Injury with Fall? 0 - - - -   Is the patient's home free of loose throw rugs in walkways, pet beds, electrical cords, etc?   yes      Grab bars in the bathroom? yes      Handrails on the stairs?   yes      Adequate lighting?   yes  Depression Screen PHQ 2/9 Scores 10/16/2019 04/18/2019 10/12/2018 03/31/2018  PHQ - 2 Score 0 0 0 0  PHQ- 9  Score - - - -    Cognitive Function       Ad8 score reviewed for issues:  Issues making decisions: no  Less interest in hobbies / activities: no  Repeats questions, stories (family complaining): no  Trouble using ordinary gadgets (microwave, computer, phone):no  Forgets the month or year: no  Mismanaging finances: no  Remembering appts: no  Daily problems with thinking and/or memory: no Ad8 score is= 0  Immunization History  Administered Date(s) Administered  . Fluad Quad(high Dose 65+) 07/26/2019  . Influenza, High Dose Seasonal PF 09/08/2013, 07/22/2017, 08/04/2018  . Influenza,inj,Quad PF,6+ Mos 08/20/2014, 08/13/2015, 07/31/2016  . Pneumococcal Conjugate-13 09/10/2014  . Pneumococcal Polysaccharide-23 05/22/2016  . Tdap 04/03/2014   Screening Tests Health Maintenance  Topic Date Due  . FOOT EXAM  04/17/2020  . OPHTHALMOLOGY EXAM  10/08/2020  . TETANUS/TDAP  04/03/2024  . INFLUENZA VACCINE  Completed  . PNA vac Low Risk Adult  Completed      Plan:    Reviewed health maintenance screenings with patient today and relevant education, vaccines, and/or referrals were provided.   I have personally reviewed and noted the following in the patient's chart:   . Medical and social history . Use of alcohol, tobacco or illicit drugs  . Current medications and supplements . Functional ability and status . Nutritional status . Physical activity . Advanced directives . List of other physicians . Vitals . Screenings to include cognitive, depression, and falls . Referrals and appointments  In addition, I have reviewed and discussed with patient certain preventive protocols, quality metrics, and best practice recommendations. A written personalized care plan for preventive services as well as general preventive health recommendations were provided to patient.     Michiel Cowboy, RN  10/16/2019  Medical screening examination/treatment/procedure(s) were performed by  non-physician practitioner and as supervising physician I was immediately available for consultation/collaboration. I agree with above. Cathlean Cower, MD

## 2019-10-16 ENCOUNTER — Ambulatory Visit (INDEPENDENT_AMBULATORY_CARE_PROVIDER_SITE_OTHER): Payer: Medicare Other | Admitting: *Deleted

## 2019-10-16 ENCOUNTER — Encounter: Payer: Self-pay | Admitting: Internal Medicine

## 2019-10-16 ENCOUNTER — Other Ambulatory Visit: Payer: Self-pay

## 2019-10-16 ENCOUNTER — Ambulatory Visit (INDEPENDENT_AMBULATORY_CARE_PROVIDER_SITE_OTHER): Payer: Medicare Other | Admitting: Internal Medicine

## 2019-10-16 ENCOUNTER — Other Ambulatory Visit (INDEPENDENT_AMBULATORY_CARE_PROVIDER_SITE_OTHER): Payer: Medicare Other

## 2019-10-16 VITALS — BP 146/68 | HR 63 | Temp 97.8°F | Wt 207.0 lb

## 2019-10-16 VITALS — BP 146/68 | HR 63 | Ht 74.0 in | Wt 207.0 lb

## 2019-10-16 DIAGNOSIS — E559 Vitamin D deficiency, unspecified: Secondary | ICD-10-CM

## 2019-10-16 DIAGNOSIS — E1122 Type 2 diabetes mellitus with diabetic chronic kidney disease: Secondary | ICD-10-CM | POA: Diagnosis not present

## 2019-10-16 DIAGNOSIS — Z0000 Encounter for general adult medical examination without abnormal findings: Secondary | ICD-10-CM | POA: Diagnosis not present

## 2019-10-16 DIAGNOSIS — E611 Iron deficiency: Secondary | ICD-10-CM | POA: Diagnosis not present

## 2019-10-16 DIAGNOSIS — I25110 Atherosclerotic heart disease of native coronary artery with unstable angina pectoris: Secondary | ICD-10-CM | POA: Diagnosis not present

## 2019-10-16 DIAGNOSIS — E538 Deficiency of other specified B group vitamins: Secondary | ICD-10-CM | POA: Diagnosis not present

## 2019-10-16 DIAGNOSIS — I10 Essential (primary) hypertension: Secondary | ICD-10-CM | POA: Diagnosis not present

## 2019-10-16 DIAGNOSIS — E7800 Pure hypercholesterolemia, unspecified: Secondary | ICD-10-CM

## 2019-10-16 DIAGNOSIS — N182 Chronic kidney disease, stage 2 (mild): Secondary | ICD-10-CM

## 2019-10-16 DIAGNOSIS — N183 Chronic kidney disease, stage 3 unspecified: Secondary | ICD-10-CM

## 2019-10-16 LAB — BASIC METABOLIC PANEL
BUN: 29 mg/dL — ABNORMAL HIGH (ref 6–23)
CO2: 27 mEq/L (ref 19–32)
Calcium: 9 mg/dL (ref 8.4–10.5)
Chloride: 106 mEq/L (ref 96–112)
Creatinine, Ser: 1.63 mg/dL — ABNORMAL HIGH (ref 0.40–1.50)
GFR: 40.88 mL/min — ABNORMAL LOW (ref >60.00)
Glucose, Bld: 103 mg/dL — ABNORMAL HIGH (ref 70–99)
Potassium: 4.4 mEq/L (ref 3.5–5.1)
Sodium: 141 mEq/L (ref 135–145)

## 2019-10-16 LAB — CBC WITH DIFFERENTIAL/PLATELET
Basophils Absolute: 0.1 10*3/uL (ref 0.0–0.1)
Basophils Relative: 1.2 % (ref 0.0–3.0)
Eosinophils Absolute: 0.4 10*3/uL (ref 0.0–0.7)
Eosinophils Relative: 7.9 % — ABNORMAL HIGH (ref 0.0–5.0)
HCT: 34.4 % — ABNORMAL LOW (ref 39.0–52.0)
Hemoglobin: 11.1 g/dL — ABNORMAL LOW (ref 13.0–17.0)
Lymphocytes Relative: 25.6 % (ref 12.0–46.0)
Lymphs Abs: 1.5 10*3/uL (ref 0.7–4.0)
MCHC: 32.3 g/dL (ref 30.0–36.0)
MCV: 91.3 fl (ref 78.0–100.0)
Monocytes Absolute: 0.5 10*3/uL (ref 0.1–1.0)
Monocytes Relative: 9.7 % (ref 3.0–12.0)
Neutro Abs: 3.2 10*3/uL (ref 1.4–7.7)
Neutrophils Relative %: 55.6 % (ref 43.0–77.0)
Platelets: 135 10*3/uL — ABNORMAL LOW (ref 150.0–400.0)
RBC: 3.76 Mil/uL — ABNORMAL LOW (ref 4.22–5.81)
RDW: 15.1 % (ref 11.5–15.5)
WBC: 5.7 10*3/uL (ref 4.0–10.5)

## 2019-10-16 LAB — LIPID PANEL
Cholesterol: 109 mg/dL (ref 0–200)
HDL: 42.7 mg/dL (ref >39.00)
LDL Cholesterol: 46 mg/dL (ref 0–99)
NonHDL: 66.69
Total CHOL/HDL Ratio: 3
Triglycerides: 101 mg/dL (ref 0.0–149.0)
VLDL: 20.2 mg/dL (ref 0.0–40.0)

## 2019-10-16 LAB — HEPATIC FUNCTION PANEL
ALT: 10 U/L (ref 0–53)
AST: 13 U/L (ref 0–37)
Albumin: 4.4 g/dL (ref 3.5–5.2)
Alkaline Phosphatase: 67 U/L (ref 39–117)
Bilirubin, Direct: 0.1 mg/dL (ref 0.0–0.3)
Total Bilirubin: 0.7 mg/dL (ref 0.2–1.2)
Total Protein: 6.5 g/dL (ref 6.0–8.3)

## 2019-10-16 LAB — VITAMIN B12: Vitamin B-12: 179 pg/mL — ABNORMAL LOW (ref 211–911)

## 2019-10-16 LAB — IBC PANEL
Iron: 71 ug/dL (ref 42–165)
Saturation Ratios: 16.2 % — ABNORMAL LOW (ref 20.0–50.0)
Transferrin: 313 mg/dL (ref 212.0–360.0)

## 2019-10-16 LAB — TSH: TSH: 2.27 u[IU]/mL (ref 0.35–4.50)

## 2019-10-16 LAB — VITAMIN D 25 HYDROXY (VIT D DEFICIENCY, FRACTURES): VITD: 43.4 ng/mL (ref 30.00–100.00)

## 2019-10-16 LAB — HEMOGLOBIN A1C: Hgb A1c MFr Bld: 6.6 % — ABNORMAL HIGH (ref 4.6–6.5)

## 2019-10-16 NOTE — Patient Instructions (Addendum)

## 2019-10-16 NOTE — Assessment & Plan Note (Signed)
stable overall by history and exam, recent data reviewed with pt, and pt to continue medical treatment as before,  to f/u any worsening symptoms or concerns  

## 2019-10-16 NOTE — Progress Notes (Signed)
Subjective:    Patient ID: Thomas Marshall, male    DOB: 09/07/1941, 78 y.o.   MRN: [id number]  HPI  Here to f/u; overall doing ok,  Pt denies chest pain, increasing sob or doe, wheezing, orthopnea, PND, increased LE swelling, palpitations, dizziness or syncope.  Pt denies new neurological symptoms such as new headache, or facial or extremity weakness or numbness.  Pt denies polydipsia, polyuria, or low sugar episode.  Pt states overall good compliance with meds, mostly trying to follow appropriate diet, with wt overall down several lbs.  Lost 5 lbs with better diet.   Wt Readings from Last 3 Encounters:  10/16/19 207 lb (93.9 kg)  08/08/19 204 lb (92.5 kg)  07/20/19 212 lb (96.2 kg)  now seeing [facility] for CKD, last visit earlier this mo, with f/u yearly.  BP 120-130 range at home daily. CBGs in very low 100s.   Has new mild intention tremor recently with signing documents and checks Past Medical History:  Diagnosis Date  . AAA (abdominal aortic aneurysm) ([place])   . Arthritis   . Cancer (HCC)    skin & squamous cell  . Carotid artery disease ([place])    a. s/p R CEA 12/2015.  [name] CKD (chronic kidney disease), stage III    a. per historical labs.  . Coronary artery disease    a. s/p CABG 2004.  . Diabetes mellitus without complication ([place])   . GERD (gastroesophageal reflux disease)   . Hyperlipidemia   . Hypertension   . Medication intolerance Multiple   . PAF (paroxysmal atrial fibrillation) ([place])   . Pneumonia   . Sinus bradycardia    a. baseline HR 50s-60s, also h/o bradycardia on metoprolol and carvedilol   Past Surgical History:  Procedure Laterality Date  . ANKLE SURGERY     right fused  . BACK SURGERY     low back   . CHOLECYSTECTOMY N/A 12/07/2013   Procedure: LAPAROSCOPIC CHOLECYSTECTOMY ;  Surgeon: [name], MD;  Location: [place] [facility];  Service: General;  Laterality: N/A;  . COLONOSCOPY    . CORONARY ARTERY BYPASS GRAFT  2004   [place]  . CORONARY STENT INTERVENTION  N/A 05/21/2017   Procedure: Coronary Stent Intervention;  Surgeon: [name], MD;  Location: [facility];  Service: Cardiovascular;  Laterality: N/A;  . ENDARTERECTOMY Right 12/10/2015   Procedure: Right Carotid ENDARTERECTOMY;  Surgeon: Conrad Mora, MD;  Location: [facility];  Service: Vascular;  Laterality: Right;  . EXPLORATION POST OPERATIVE OPEN HEART    . FINGER SURGERY     skin graft on rt index  . FINGER SURGERY Right    right index finger.  [name] HERNIA REPAIR Right    RIH  . JOINT REPLACEMENT    . KNEE SURGERY     replacement on both knees  . LEFT HEART CATH AND CORS/GRAFTS ANGIOGRAPHY N/A 05/21/2017   Procedure: Left Heart Cath and Cors/Grafts Angiography;  Surgeon: [name], MD;  Location: [facility];  Service: Cardiovascular;  Laterality: N/A;  . PROSTATE SURGERY    . PVC ABLATION N/A 10/27/2017   Procedure: PVC ABLATION;  Surgeon: Evans Lance, MD;  Location: [facility];  Service: Cardiovascular;  Laterality: N/A;  . TONSILLECTOMY    . URINARY SURGERY     scar tissue    reports that he quit smoking about 48 years ago. He has a 13.00 pack-year smoking history. He has never used smokeless tobacco. He  reports that he does not drink alcohol or use drugs. family history includes Cancer in his sister; Diabetes in his brother and sister; Heart disease in his brother, father, and mother; Hyperlipidemia in his brother and father; Hypertension in his brother, father, and sister; Stroke in his mother. Allergies  Allergen Reactions  . Lisinopril Swelling and Other (See Comments)    Angioedema.   . Losartan Swelling and Other (See Comments)    Lips swell Angioedema   . Nifedipine Swelling and Other (See Comments)    Sugar increase  . Triamterene Swelling  . Hydralazine Hcl Other (See Comments)    Fatigue; poor appetite  . Amoxicillin Rash and Other (See Comments)    Has patient had a PCN reaction causing immediate rash, facial/tongue/throat  swelling, SOB or lightheadedness with hypotension:No Has patient had a PCN reaction causing severe rash involving mucus membranes or skin necrosis:No Has patient had a PCN reaction that required hospitalization:No Has patient had a PCN reaction occurring within the last 10 years: No If all of the above answers are "NO", then may proceed with Cephalosporin use.   . Ampicillin Rash and Other (See Comments)    Has patient had a PCN reaction causing immediate rash, facial/tongue/throat swelling, SOB or lightheadedness with hypotension:No Has patient had a PCN reaction causing severe rash involving mucus membranes or skin necrosis:No Has patient had a PCN reaction that required hospitalization:No Has patient had a PCN reaction occurring within the last 10 years: No If all of the above answers are "NO", then may proceed with Cephalosporin use.    . Carvedilol Other (See Comments)    Low heart rate    Current Outpatient Medications on File Prior to Visit  Medication Sig Dispense Refill  . amLODipine (NORVASC) 10 MG tablet Take 1 tablet (10 mg total) by mouth daily. 30 tablet 11  . apixaban (ELIQUIS) 2.5 MG TABS tablet Take 1 tablet (2.5 mg total) by mouth 2 (two) times daily. (0800 & 2000) 180 tablet 3  . aspirin EC 81 MG tablet Take 1 tablet (81 mg total) by mouth daily. 90 tablet 3  . atorvastatin (LIPITOR) 40 MG tablet Take 40 mg by mouth at bedtime.     . cholecalciferol (VITAMIN D) 1000 UNITS tablet Take 1,000 Units by mouth every evening.     . cloNIDine (CATAPRES) 0.1 MG tablet Take 1 tablet (0.1 mg total) by mouth 2 (two) times daily. 180 tablet 3  . dofetilide (TIKOSYN) 250 MCG capsule Take 1 capsule (250 mcg total) by mouth 2 (two) times daily. 60 capsule 6  . gabapentin (NEURONTIN) 100 MG capsule Take 2 capsules (200 mg total) by mouth at bedtime. (Patient taking differently: Take 200 mg by mouth daily as needed. ) 60 capsule 3  . glucose blood test strip Accu-Check Guide Strips. Dx  E11.9 Use to test blood glucose level 2 times a day. 50 each 11  . magnesium oxide (MAG-OX) 400 MG tablet Take 1 tablet (400 mg total) by mouth 4 (four) times daily. (Patient taking differently: Take 800 mg by mouth 2 (two) times daily. ) 360 tablet 3  . metFORMIN (GLUCOPHAGE) 1000 MG tablet TAKE 1 TABLET BY MOUTH TWICE DAILY WITH  A MEAL 180 tablet 3  . nitroGLYCERIN (NITROSTAT) 0.4 MG SL tablet Place 1 tablet (0.4 mg total) under the tongue every 5 (five) minutes as needed for chest pain (x 3 doses). 30 tablet 3  . propranolol (INDERAL) 20 MG tablet Take one tablet by mouth PRN  for irregular heartbeat.  Maximum 2 tablets in 24 hours. 30 tablet 3  . terazosin (HYTRIN) 1 MG capsule Take 1 mg by mouth at bedtime. (2100) 30 capsule 11  . vitamin C (ASCORBIC ACID) 500 MG tablet Take 500 mg by mouth daily.     . pantoprazole (PROTONIX) 40 MG tablet Take 1 tablet (40 mg total) by mouth daily. 90 tablet 3   No current facility-administered medications on file prior to visit.    Review of Systems  Constitutional: Negative for other unusual diaphoresis or sweats HENT: Negative for ear discharge or swelling Eyes: Negative for other worsening visual disturbances Respiratory: Negative for stridor or other swelling  Gastrointestinal: Negative for worsening distension or other blood Genitourinary: Negative for retention or other urinary change Musculoskeletal: Negative for other MSK pain or swelling Skin: Negative for color change or other new lesions Neurological: Negative for worsening tremors and other numbness  Psychiatric/Behavioral: Negative for worsening agitation or other fatigue All otherwise neg per pt    Objective:   Physical Exam BP (!) 146/68   Pulse 63   Temp 97.8 F (36.6 C) (Oral)   Wt 207 lb (93.9 kg)   SpO2 98%   BMI 26.58 kg/m  VS noted,  Constitutional: Pt appears in NAD HENT: Head: NCAT.  Right Ear: External ear normal.  Left Ear: External ear normal.  Eyes: . Pupils  are equal, round, and reactive to light. Conjunctivae and EOM are normal Nose: without d/c or deformity Neck: Neck supple. Gross normal ROM Cardiovascular: Normal rate and regular rhythm.   Pulmonary/Chest: Effort normal and breath sounds without rales or wheezing.  Abd:  Soft, NT, ND, + BS, no organomegaly Neurological: Pt is alert. At baseline orientation, motor grossly intact Skin: Skin is warm. No rashes, other new lesions, no LE edema Psychiatric: Pt behavior is normal without agitation  All otherwise neg per pt  Lab Results  Component Value Date   WBC 5.9 03/31/2018   HGB 11.9 (L) 03/31/2018   HCT 36.3 (L) 03/31/2018   PLT 129.0 (L) 03/31/2018   GLUCOSE 192 (H) 07/18/2019   CHOL 108 04/18/2019   TRIG 104.0 04/18/2019   HDL 38.70 (L) 04/18/2019   LDLDIRECT 67.0 09/16/2016   LDLCALC 49 04/18/2019   ALT 12 04/18/2019   AST 19 04/18/2019   NA 142 07/18/2019   K 4.9 07/18/2019   CL 104 07/18/2019   CREATININE 1.65 (H) 07/18/2019   BUN 28 (H) 07/18/2019   CO2 24 07/18/2019   TSH 2.50 03/31/2018   PSA 1.09 03/31/2018   INR 1.19 09/04/2017   HGBA1C 6.8 (H) 04/18/2019   MICROALBUR 5.1 (H) 03/31/2018      Assessment & Plan:

## 2019-10-18 ENCOUNTER — Ambulatory Visit (INDEPENDENT_AMBULATORY_CARE_PROVIDER_SITE_OTHER): Payer: Medicare Other

## 2019-10-18 ENCOUNTER — Other Ambulatory Visit: Payer: Self-pay

## 2019-10-18 DIAGNOSIS — E538 Deficiency of other specified B group vitamins: Secondary | ICD-10-CM

## 2019-10-18 MED ORDER — CYANOCOBALAMIN 1000 MCG/ML IJ SOLN
1000.0000 ug | Freq: Once | INTRAMUSCULAR | Status: AC
  Administered 2019-10-18: 1000 ug via INTRAMUSCULAR

## 2019-10-18 NOTE — Progress Notes (Signed)
Medical screening examination/treatment/procedure(s) were performed by non-physician practitioner and as supervising physician I was immediately available for consultation/collaboration. I agree with above. James John, MD   

## 2019-10-27 ENCOUNTER — Other Ambulatory Visit: Payer: Self-pay | Admitting: Internal Medicine

## 2019-10-27 DIAGNOSIS — E1122 Type 2 diabetes mellitus with diabetic chronic kidney disease: Secondary | ICD-10-CM

## 2019-10-27 DIAGNOSIS — N182 Chronic kidney disease, stage 2 (mild): Secondary | ICD-10-CM

## 2019-10-31 ENCOUNTER — Other Ambulatory Visit: Payer: Self-pay

## 2019-10-31 ENCOUNTER — Ambulatory Visit (INDEPENDENT_AMBULATORY_CARE_PROVIDER_SITE_OTHER): Payer: Medicare Other | Admitting: Podiatry

## 2019-10-31 ENCOUNTER — Encounter: Payer: Self-pay | Admitting: Podiatry

## 2019-10-31 DIAGNOSIS — E1142 Type 2 diabetes mellitus with diabetic polyneuropathy: Secondary | ICD-10-CM | POA: Diagnosis not present

## 2019-10-31 DIAGNOSIS — B351 Tinea unguium: Secondary | ICD-10-CM

## 2019-10-31 DIAGNOSIS — D689 Coagulation defect, unspecified: Secondary | ICD-10-CM | POA: Diagnosis not present

## 2019-10-31 NOTE — Progress Notes (Signed)
Complaint:  Visit Type: Patient returns to my office for continued preventative foot care services. Complaint: Patient states" my nails have grown long and thick and become painful to walk and wear shoes" Patient has been diagnosed with DM with no foot complications. The patient presents for preventative foot care services. No changes to ROS.  Patient is taking eliquiss.  Podiatric Exam: Vascular: dorsalis pedis and posterior tibial pulses are palpable bilateral. Capillary return is immediate. Temperature gradient is WNL. Skin turgor WNL  Sensorium: Diminished  Semmes Weinstein monofilament test. Normal tactile sensation bilaterally. Nail Exam: Pt has thick disfigured discolored nails with subungual debris noted bilateral entire nail hallux through fifth toenails Ulcer Exam: There is no evidence of ulcer or pre-ulcerative changes or infection. Orthopedic Exam: Muscle tone and strength are WNL. No limitations in general ROM. No crepitus or effusions noted. Foot type and digits show no abnormalities. Bony prominences fifth metabase right foot. Skin: No Porokeratosis. No infection or ulcers.  Asymptomatic callus sub 5th metabase right and sub 5th met head right.  Diagnosis:  Onychomycosis, , Pain in right toe, pain in left toes  Treatment & Plan Procedures and Treatment: Consent by patient was obtained for treatment procedures.   Debridement of mycotic and hypertrophic toenails, 1 through 5 bilateral and clearing of subungual debris. No ulceration, no infection noted.  Return Visit-Office Procedure: Patient instructed to return to the office for a follow up visit 3 months for continued evaluation and treatment.    Gardiner Barefoot DPM

## 2019-11-16 ENCOUNTER — Ambulatory Visit (INDEPENDENT_AMBULATORY_CARE_PROVIDER_SITE_OTHER): Payer: Medicare Other

## 2019-11-16 ENCOUNTER — Other Ambulatory Visit: Payer: Self-pay

## 2019-11-16 DIAGNOSIS — E538 Deficiency of other specified B group vitamins: Secondary | ICD-10-CM

## 2019-11-16 MED ORDER — CYANOCOBALAMIN 1000 MCG/ML IJ SOLN
1000.0000 ug | Freq: Once | INTRAMUSCULAR | Status: AC
  Administered 2019-11-16: 1000 ug via INTRAMUSCULAR

## 2019-11-17 ENCOUNTER — Ambulatory Visit: Payer: Medicare Other

## 2019-11-20 ENCOUNTER — Observation Stay (HOSPITAL_COMMUNITY): Payer: No Typology Code available for payment source

## 2019-11-20 ENCOUNTER — Encounter (HOSPITAL_COMMUNITY): Payer: Self-pay | Admitting: Emergency Medicine

## 2019-11-20 ENCOUNTER — Emergency Department (HOSPITAL_COMMUNITY): Payer: No Typology Code available for payment source

## 2019-11-20 ENCOUNTER — Observation Stay (HOSPITAL_COMMUNITY)
Admission: EM | Admit: 2019-11-20 | Discharge: 2019-11-21 | Disposition: A | Discharge status: Home / Self Care | Payer: No Typology Code available for payment source | Attending: Family Medicine | Admitting: Family Medicine

## 2019-11-20 ENCOUNTER — Other Ambulatory Visit: Payer: Self-pay

## 2019-11-20 DIAGNOSIS — I714 Abdominal aortic aneurysm, without rupture: Secondary | ICD-10-CM | POA: Insufficient documentation

## 2019-11-20 DIAGNOSIS — N1832 Chronic kidney disease, stage 3b: Secondary | ICD-10-CM | POA: Diagnosis not present

## 2019-11-20 DIAGNOSIS — E1129 Type 2 diabetes mellitus with other diabetic kidney complication: Secondary | ICD-10-CM | POA: Diagnosis present

## 2019-11-20 DIAGNOSIS — M1990 Unspecified osteoarthritis, unspecified site: Secondary | ICD-10-CM | POA: Insufficient documentation

## 2019-11-20 DIAGNOSIS — I129 Hypertensive chronic kidney disease with stage 1 through stage 4 chronic kidney disease, or unspecified chronic kidney disease: Secondary | ICD-10-CM | POA: Diagnosis not present

## 2019-11-20 DIAGNOSIS — Z951 Presence of aortocoronary bypass graft: Secondary | ICD-10-CM

## 2019-11-20 DIAGNOSIS — R202 Paresthesia of skin: Secondary | ICD-10-CM

## 2019-11-20 DIAGNOSIS — E7800 Pure hypercholesterolemia, unspecified: Secondary | ICD-10-CM | POA: Diagnosis not present

## 2019-11-20 DIAGNOSIS — G459 Transient cerebral ischemic attack, unspecified: Secondary | ICD-10-CM | POA: Diagnosis not present

## 2019-11-20 DIAGNOSIS — E785 Hyperlipidemia, unspecified: Secondary | ICD-10-CM | POA: Diagnosis present

## 2019-11-20 DIAGNOSIS — Z96653 Presence of artificial knee joint, bilateral: Secondary | ICD-10-CM | POA: Insufficient documentation

## 2019-11-20 DIAGNOSIS — Z880 Allergy status to penicillin: Secondary | ICD-10-CM | POA: Insufficient documentation

## 2019-11-20 DIAGNOSIS — E1122 Type 2 diabetes mellitus with diabetic chronic kidney disease: Secondary | ICD-10-CM | POA: Diagnosis present

## 2019-11-20 DIAGNOSIS — N400 Enlarged prostate without lower urinary tract symptoms: Secondary | ICD-10-CM | POA: Diagnosis present

## 2019-11-20 DIAGNOSIS — Z7901 Long term (current) use of anticoagulants: Secondary | ICD-10-CM | POA: Diagnosis not present

## 2019-11-20 DIAGNOSIS — D61818 Other pancytopenia: Secondary | ICD-10-CM | POA: Diagnosis present

## 2019-11-20 DIAGNOSIS — I2510 Atherosclerotic heart disease of native coronary artery without angina pectoris: Secondary | ICD-10-CM | POA: Diagnosis not present

## 2019-11-20 DIAGNOSIS — Z87891 Personal history of nicotine dependence: Secondary | ICD-10-CM | POA: Insufficient documentation

## 2019-11-20 DIAGNOSIS — Z7982 Long term (current) use of aspirin: Secondary | ICD-10-CM | POA: Insufficient documentation

## 2019-11-20 DIAGNOSIS — Z955 Presence of coronary angioplasty implant and graft: Secondary | ICD-10-CM | POA: Insufficient documentation

## 2019-11-20 DIAGNOSIS — Z7984 Long term (current) use of oral hypoglycemic drugs: Secondary | ICD-10-CM | POA: Diagnosis not present

## 2019-11-20 DIAGNOSIS — R001 Bradycardia, unspecified: Secondary | ICD-10-CM | POA: Diagnosis not present

## 2019-11-20 DIAGNOSIS — K219 Gastro-esophageal reflux disease without esophagitis: Secondary | ICD-10-CM | POA: Diagnosis not present

## 2019-11-20 DIAGNOSIS — Z20828 Contact with and (suspected) exposure to other viral communicable diseases: Secondary | ICD-10-CM | POA: Diagnosis not present

## 2019-11-20 DIAGNOSIS — R200 Anesthesia of skin: Secondary | ICD-10-CM | POA: Diagnosis present

## 2019-11-20 DIAGNOSIS — I480 Paroxysmal atrial fibrillation: Secondary | ICD-10-CM | POA: Diagnosis not present

## 2019-11-20 DIAGNOSIS — Z79899 Other long term (current) drug therapy: Secondary | ICD-10-CM | POA: Diagnosis not present

## 2019-11-20 DIAGNOSIS — I779 Disorder of arteries and arterioles, unspecified: Secondary | ICD-10-CM | POA: Diagnosis not present

## 2019-11-20 DIAGNOSIS — Z888 Allergy status to other drugs, medicaments and biological substances status: Secondary | ICD-10-CM | POA: Diagnosis not present

## 2019-11-20 DIAGNOSIS — N183 Chronic kidney disease, stage 3 unspecified: Secondary | ICD-10-CM | POA: Diagnosis present

## 2019-11-20 DIAGNOSIS — E1121 Type 2 diabetes mellitus with diabetic nephropathy: Secondary | ICD-10-CM

## 2019-11-20 LAB — TROPONIN I (HIGH SENSITIVITY)
Troponin I (High Sensitivity): 15 ng/L (ref <18)
Troponin I (High Sensitivity): 16 ng/L (ref <18)

## 2019-11-20 LAB — RAPID URINE DRUG SCREEN, HOSP PERFORMED
Amphetamines: NOT DETECTED
Barbiturates: NOT DETECTED
Benzodiazepines: NOT DETECTED
Cocaine: NOT DETECTED
Opiates: NOT DETECTED
Tetrahydrocannabinol: NOT DETECTED

## 2019-11-20 LAB — COMPREHENSIVE METABOLIC PANEL
ALT: 18 U/L (ref 0–44)
AST: 21 U/L (ref 15–41)
Albumin: 3.7 g/dL (ref 3.5–5.0)
Alkaline Phosphatase: 53 U/L (ref 38–126)
Anion gap: 8 (ref 5–15)
BUN: 26 mg/dL — ABNORMAL HIGH (ref 8–23)
CO2: 26 mmol/L (ref 22–32)
Calcium: 8.8 mg/dL — ABNORMAL LOW (ref 8.9–10.3)
Chloride: 106 mmol/L (ref 98–111)
Creatinine, Ser: 1.64 mg/dL — ABNORMAL HIGH (ref 0.61–1.24)
GFR calc Af Amer: 45 mL/min — ABNORMAL LOW (ref >60)
GFR calc non Af Amer: 39 mL/min — ABNORMAL LOW (ref >60)
Glucose, Bld: 195 mg/dL — ABNORMAL HIGH (ref 70–99)
Potassium: 4.3 mmol/L (ref 3.5–5.1)
Sodium: 140 mmol/L (ref 135–145)
Total Bilirubin: 0.6 mg/dL (ref 0.3–1.2)
Total Protein: 6.1 g/dL — ABNORMAL LOW (ref 6.5–8.1)

## 2019-11-20 LAB — URINALYSIS, ROUTINE W REFLEX MICROSCOPIC
Bilirubin Urine: NEGATIVE
Glucose, UA: NEGATIVE mg/dL
Hgb urine dipstick: NEGATIVE
Ketones, ur: NEGATIVE mg/dL
Leukocytes,Ua: NEGATIVE
Nitrite: NEGATIVE
Protein, ur: NEGATIVE mg/dL
Specific Gravity, Urine: 1.025 (ref 1.005–1.030)
pH: 7 (ref 5.0–8.0)

## 2019-11-20 LAB — CBC
HCT: 33.6 % — ABNORMAL LOW (ref 39.0–52.0)
Hemoglobin: 10.5 g/dL — ABNORMAL LOW (ref 13.0–17.0)
MCH: 29.8 pg (ref 26.0–34.0)
MCHC: 31.3 g/dL (ref 30.0–36.0)
MCV: 95.5 fL (ref 80.0–100.0)
Platelets: 120 10*3/uL — ABNORMAL LOW (ref 150–400)
RBC: 3.52 MIL/uL — ABNORMAL LOW (ref 4.22–5.81)
RDW: 13.8 % (ref 11.5–15.5)
WBC: 4.5 10*3/uL (ref 4.0–10.5)
nRBC: 0 % (ref 0.0–0.2)

## 2019-11-20 LAB — I-STAT CHEM 8, ED
BUN: 26 mg/dL — ABNORMAL HIGH (ref 8–23)
Calcium, Ion: 1.18 mmol/L (ref 1.15–1.40)
Chloride: 104 mmol/L (ref 98–111)
Creatinine, Ser: 1.5 mg/dL — ABNORMAL HIGH (ref 0.61–1.24)
Glucose, Bld: 187 mg/dL — ABNORMAL HIGH (ref 70–99)
HCT: 31 % — ABNORMAL LOW (ref 39.0–52.0)
Hemoglobin: 10.5 g/dL — ABNORMAL LOW (ref 13.0–17.0)
Potassium: 4.4 mmol/L (ref 3.5–5.1)
Sodium: 141 mmol/L (ref 135–145)
TCO2: 28 mmol/L (ref 22–32)

## 2019-11-20 LAB — GLUCOSE, CAPILLARY
Glucose-Capillary: 103 mg/dL — ABNORMAL HIGH (ref 70–99)
Glucose-Capillary: 137 mg/dL — ABNORMAL HIGH (ref 70–99)

## 2019-11-20 LAB — DIFFERENTIAL
Abs Immature Granulocytes: 0.01 10*3/uL (ref 0.00–0.07)
Basophils Absolute: 0.1 10*3/uL (ref 0.0–0.1)
Basophils Relative: 1 %
Eosinophils Absolute: 0.6 10*3/uL — ABNORMAL HIGH (ref 0.0–0.5)
Eosinophils Relative: 13 %
Immature Granulocytes: 0 %
Lymphocytes Relative: 24 %
Lymphs Abs: 1.1 10*3/uL (ref 0.7–4.0)
Monocytes Absolute: 0.4 10*3/uL (ref 0.1–1.0)
Monocytes Relative: 9 %
Neutro Abs: 2.4 10*3/uL (ref 1.7–7.7)
Neutrophils Relative %: 53 %

## 2019-11-20 LAB — ETHANOL: Alcohol, Ethyl (B): <10 mg/dL (ref <10)

## 2019-11-20 LAB — SARS CORONAVIRUS 2 (TAT 6-24 HRS): SARS Coronavirus 2: NEGATIVE

## 2019-11-20 LAB — PROTIME-INR
INR: 1.2 (ref 0.8–1.2)
Prothrombin Time: 14.8 seconds (ref 11.4–15.2)

## 2019-11-20 LAB — APTT: aPTT: 32 seconds (ref 24–36)

## 2019-11-20 LAB — MAGNESIUM: Magnesium: 1.9 mg/dL (ref 1.7–2.4)

## 2019-11-20 IMAGING — CT CT ANGIO NECK
2 of 10 series · 6 of 35 positions shown · IV contrast (OMNI 350)
Comparison: CT head [DATE]

CLINICAL DATA: Focal neuro deficit.  Left-sided numbness.

EXAM:
CT ANGIOGRAPHY HEAD AND NECK
TECHNIQUE: Multidetector CT imaging of the head and neck was performed using
the standard protocol during bolus administration of intravenous
contrast. Multiplanar CT image reconstructions and MIPs were
obtained to evaluate the vascular anatomy. Carotid stenosis
measurements (when applicable) are obtained utilizing NASCET
criteria, using the distal internal carotid diameter as the
denominator.
CONTRAST:  75mL OMNIPAQUE IOHEXOL 350 MG/ML SOLN

[Series 6: cta neck thins · axial · 0.47mm/px · z∈[-313,-40]mm · 5 of 1028 slices shown]
[im 172/1028  soft-tissue]
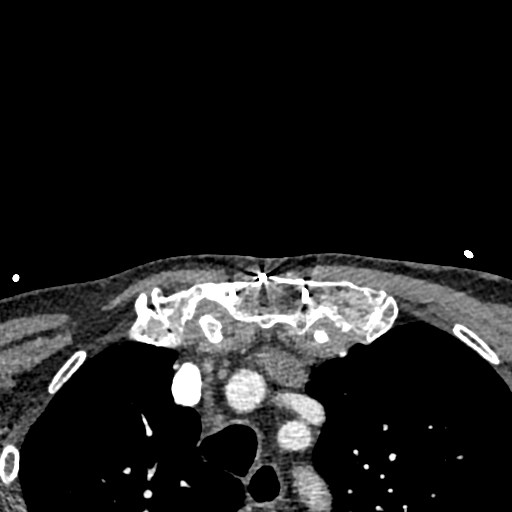
[im 343/1028  bone]
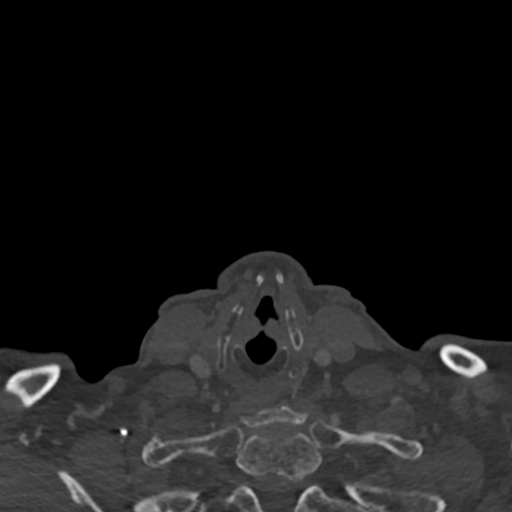
[im 514/1028  soft-tissue]
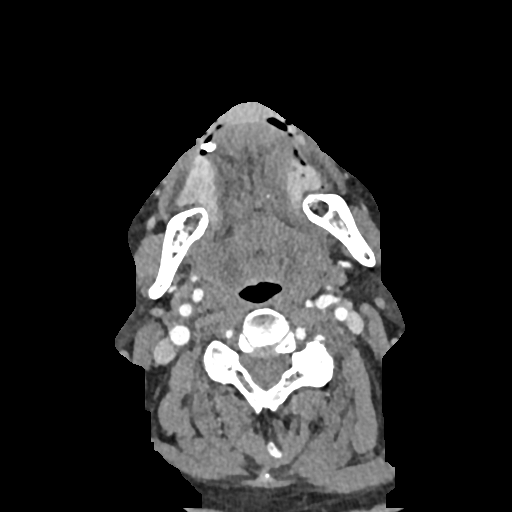
[im 685/1028  bone]
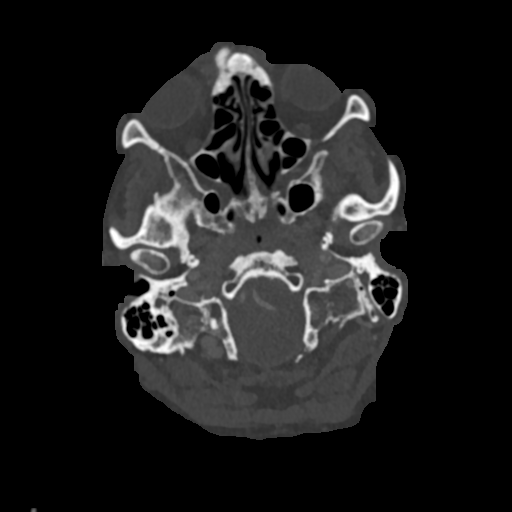
[im 856/1028  soft-tissue]
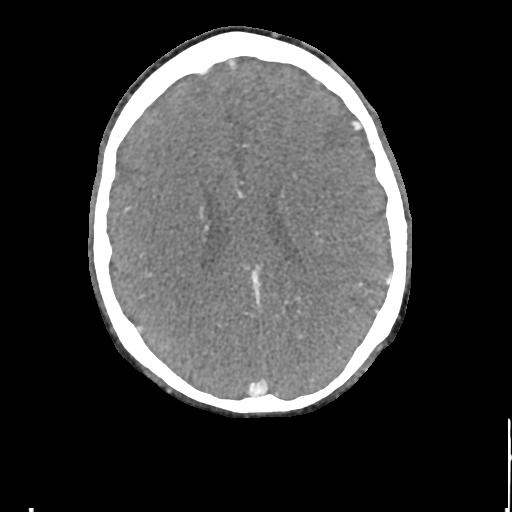

[Series 12: cta neck sagittal · sagittal · 0.54mm/px · 1 of 201 slices shown]
[im 199/201  soft-tissue]
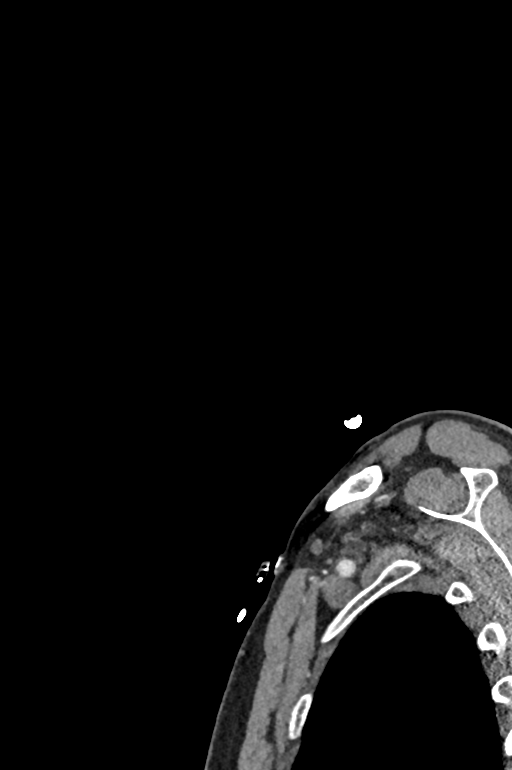

[6 of 35 positions shown; findings below may reference images not displayed]

FINDINGS: CTA NECK FINDINGS

Aortic arch: Atherosclerotic calcification aortic arch without
aneurysm or dissection. Atherosclerotic calcification in the
proximal great vessels without stenosis.

Right carotid system: Mild atherosclerotic disease right carotid
bifurcation without stenosis

Left carotid system: Calcified and noncalcified plaque at the left
carotid bifurcation causing severe stenosis of the internal carotid
artery. Estimated 80% diameter stenosis proximal left internal
carotid artery.

Vertebral arteries: Both vertebral arteries patent to the basilar
without significant stenosis.

Skeleton: Cervical spondylosis. No acute skeletal abnormality.
Median sternotomy.

Other neck: Negative for mass or adenopathy

Upper chest: Visualized lung apices clear bilaterally.

Review of the MIP images confirms the above findings

CTA HEAD FINDINGS

Anterior circulation: Atherosclerotic calcification in the cavernous
carotid bilaterally. No significant stenosis. Anterior and middle
cerebral arteries patent bilaterally. Mild atherosclerotic
irregularity in the anterior middle cerebral artery branches
bilaterally without flow limiting stenosis. No vascular
malformation.

Posterior circulation: Both vertebral arteries patent to the
basilar. Left vertebral artery is patent. Small left PICA patent.
Right PICA not visualized. AICA patent bilaterally. Basilar widely
patent. Superior cerebellar and posterior cerebral arteries patent
bilaterally. Fetal origin left posterior cerebral artery.

Venous sinuses: Normal venous enhancement

Anatomic variants: None

Review of the MIP images confirms the above findings
IMPRESSION: 1. 80% diameter stenosis proximal left internal carotid artery due
to atherosclerotic plaque. No significant right carotid stenosis. No
significant vertebral artery stenosis in the neck
2. Negative for intracranial large vessel occlusion. Mild diffuse
intracranial atherosclerotic disease without flow limiting stenosis.

## 2019-11-20 IMAGING — CT CT ANGIO HEAD
3 of 7 series · 9 of 35 positions shown · IV contrast (omnipaque)
Comparison: CT head [DATE]

CLINICAL DATA: Focal neuro deficit.  Left-sided numbness.

EXAM:
CT ANGIOGRAPHY HEAD AND NECK
TECHNIQUE: Multidetector CT imaging of the head and neck was performed using
the standard protocol during bolus administration of intravenous
contrast. Multiplanar CT image reconstructions and MIPs were
obtained to evaluate the vascular anatomy. Carotid stenosis
measurements (when applicable) are obtained utilizing NASCET
criteria, using the distal internal carotid diameter as the
denominator.
CONTRAST:  75mL OMNIPAQUE IOHEXOL 350 MG/ML SOLN

[Series 5: cta neck · axial · 0.47mm/px · z∈[-246,-110]mm · 2 of 206 slices shown]
[im 69/206  soft-tissue]
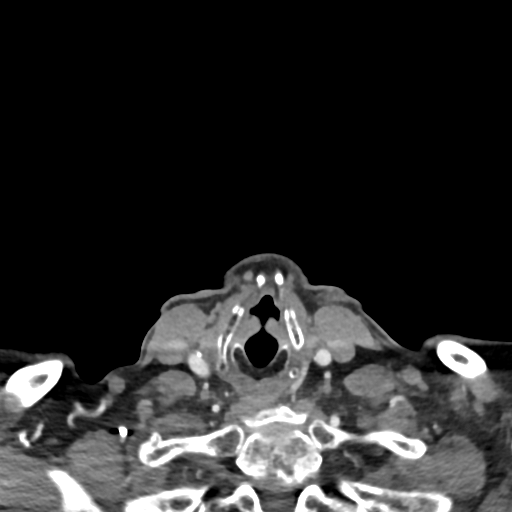
[im 137/206  soft-tissue]
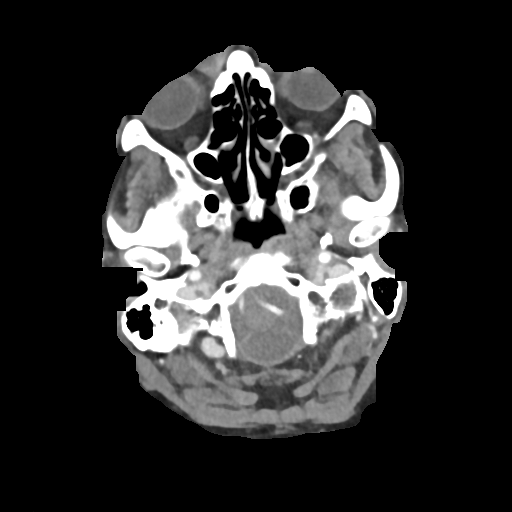

[Series 7: cta neck axial · axial · 0.37mm/px · z∈[-329,-36]mm · 6 of 409 slices shown]
[im 59/409  soft-tissue]
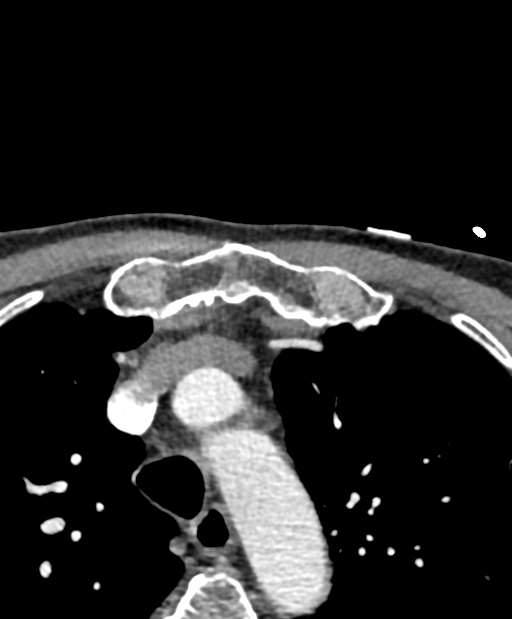
[im 117/409  bone]
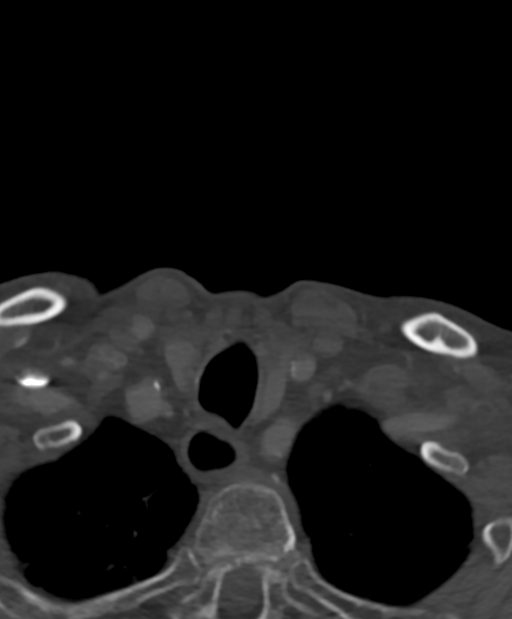
[im 175/409  soft-tissue]
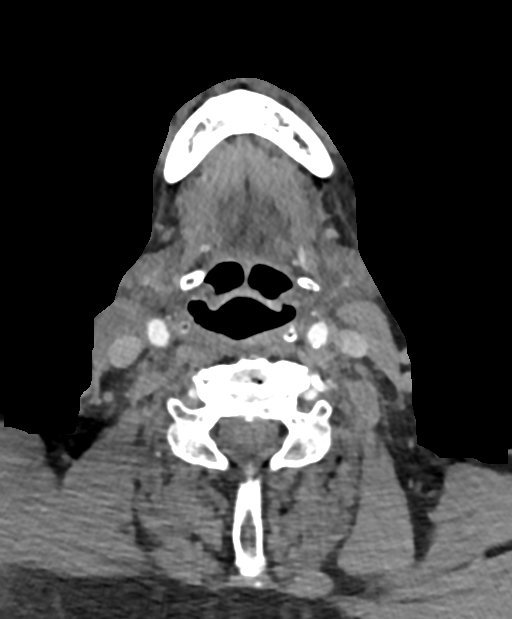
[im 234/409  bone]
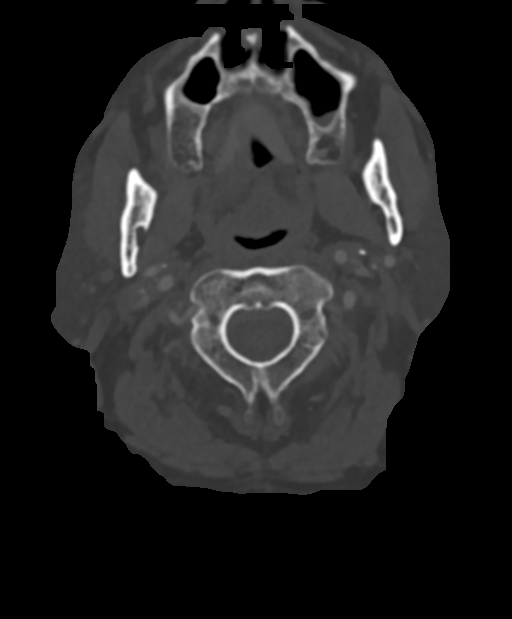
[im 292/409  soft-tissue]
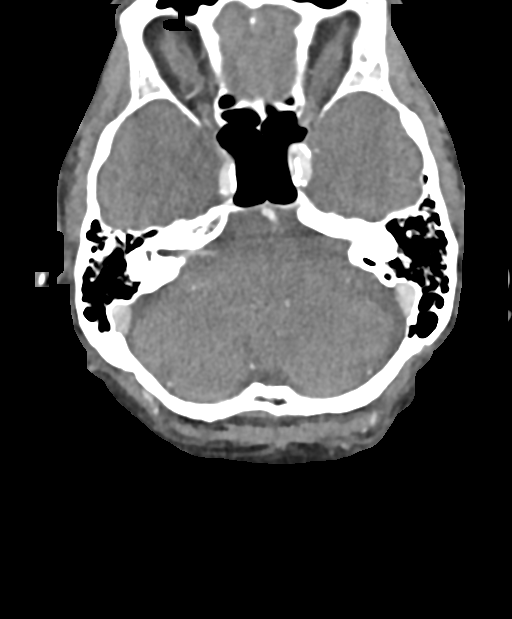
[im 350/409  bone]
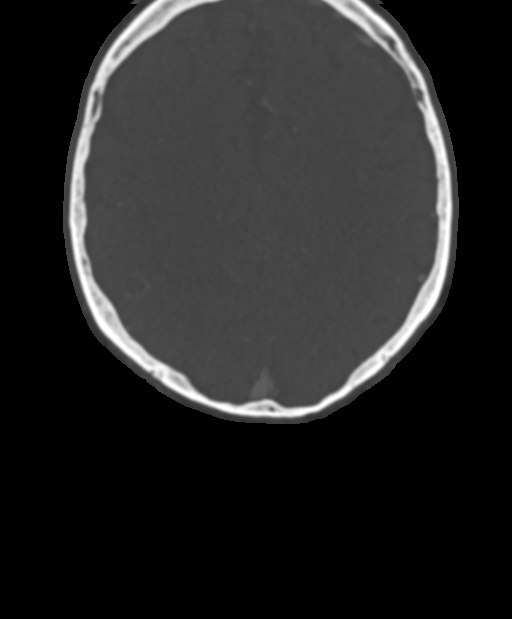

[Series 12: cta neck sagittal · sagittal · 0.54mm/px · 1 of 201 slices shown]
[im 199/201  soft-tissue]
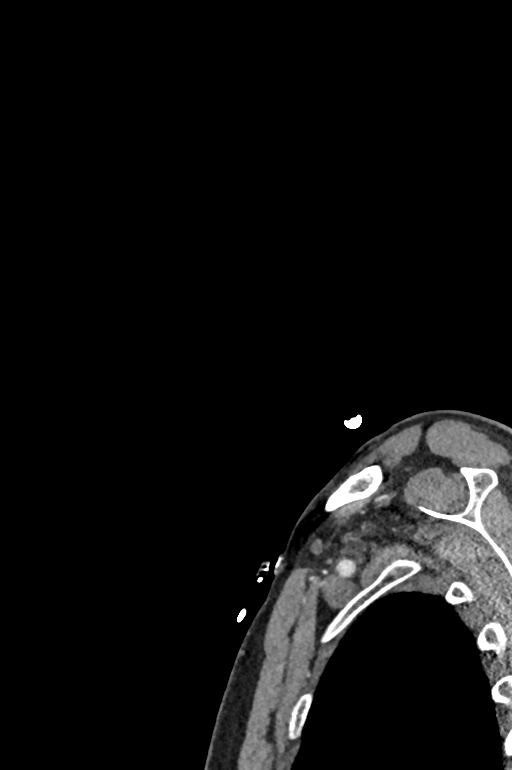

[9 of 35 positions shown; findings below may reference images not displayed]

FINDINGS: CTA NECK FINDINGS

Aortic arch: Atherosclerotic calcification aortic arch without
aneurysm or dissection. Atherosclerotic calcification in the
proximal great vessels without stenosis.

Right carotid system: Mild atherosclerotic disease right carotid
bifurcation without stenosis

Left carotid system: Calcified and noncalcified plaque at the left
carotid bifurcation causing severe stenosis of the internal carotid
artery. Estimated 80% diameter stenosis proximal left internal
carotid artery.

Vertebral arteries: Both vertebral arteries patent to the basilar
without significant stenosis.

Skeleton: Cervical spondylosis. No acute skeletal abnormality.
Median sternotomy.

Other neck: Negative for mass or adenopathy

Upper chest: Visualized lung apices clear bilaterally.

Review of the MIP images confirms the above findings

CTA HEAD FINDINGS

Anterior circulation: Atherosclerotic calcification in the cavernous
carotid bilaterally. No significant stenosis. Anterior and middle
cerebral arteries patent bilaterally. Mild atherosclerotic
irregularity in the anterior middle cerebral artery branches
bilaterally without flow limiting stenosis. No vascular
malformation.

Posterior circulation: Both vertebral arteries patent to the
basilar. Left vertebral artery is patent. Small left PICA patent.
Right PICA not visualized. AICA patent bilaterally. Basilar widely
patent. Superior cerebellar and posterior cerebral arteries patent
bilaterally. Fetal origin left posterior cerebral artery.

Venous sinuses: Normal venous enhancement

Anatomic variants: None

Review of the MIP images confirms the above findings
IMPRESSION: 1. 80% diameter stenosis proximal left internal carotid artery due
to atherosclerotic plaque. No significant right carotid stenosis. No
significant vertebral artery stenosis in the neck
2. Negative for intracranial large vessel occlusion. Mild diffuse
intracranial atherosclerotic disease without flow limiting stenosis.

## 2019-11-20 IMAGING — MR MR HEAD W/O CM
12 of 13 series · 43 of 48 positions shown · non-contrast
Comparison: None.

CLINICAL DATA: TIA

EXAM:
MRI HEAD WITHOUT CONTRAST
TECHNIQUE: Multiplanar, multiecho pulse sequences of the brain and surrounding
structures were obtained without intravenous contrast.

[Series 9: DWI · axial · 3.0mm · 0.88mm/px · z∈[-20,+115]mm · 6 of 94 slices shown (1 of 4)]
[im 1/94]
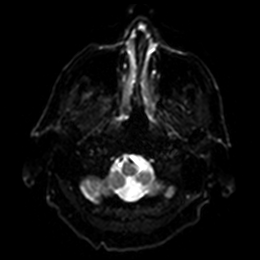
[im 19/94]
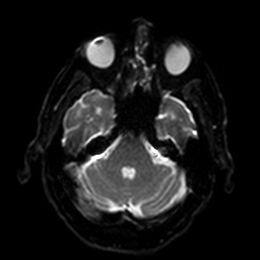
[im 38/94]
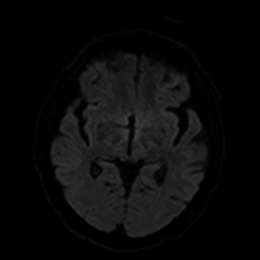
[im 56/94]
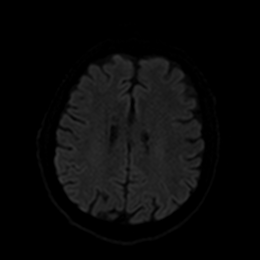
[im 75/94]
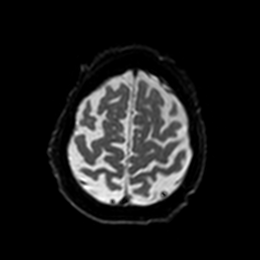
[im 94/94]
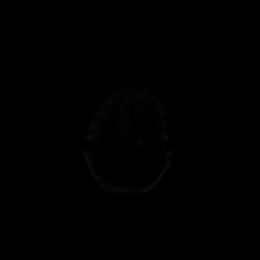

[Series 10: DWI · axial · 3.0mm · 0.88mm/px · z∈[-20,+115]mm · 4 of 47 slices shown (2 of 4)]
[im 1/47]
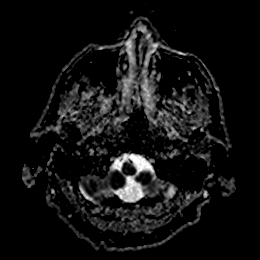
[im 16/47]
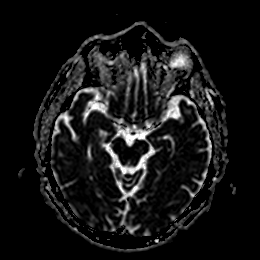
[im 31/47]
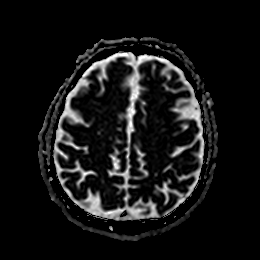
[im 47/47]
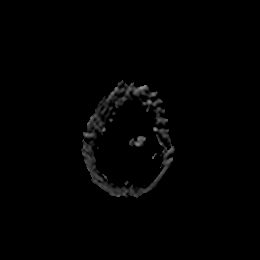

[Series 11: DWI · coronal · 4.0mm · 0.88mm/px · 5 of 66 slices shown (3 of 4)]
[im 1/66]
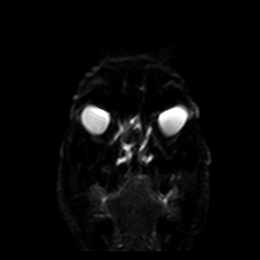
[im 17/66]
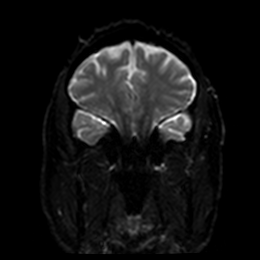
[im 33/66]
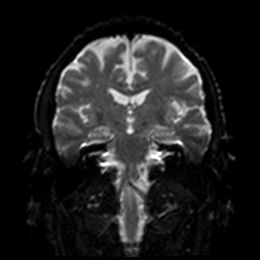
[im 49/66]
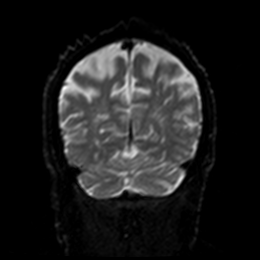
[im 66/66]
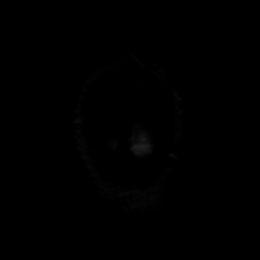

[Series 12: DWI · coronal · 4.0mm · 0.88mm/px · 2 of 33 slices shown (4 of 4)]
[im 1/33]
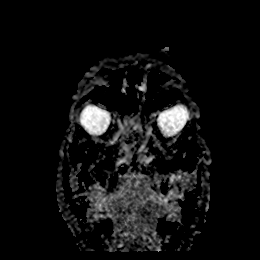
[im 33/33]
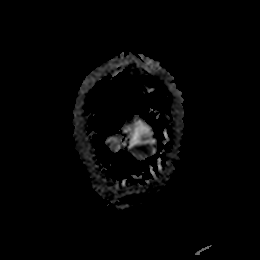

[Series 13: T2 · axial · 5.0mm · 0.72mm/px · z∈[-28,+124]mm · 2 of 27 slices shown (1 of 2)]
[im 1/27]
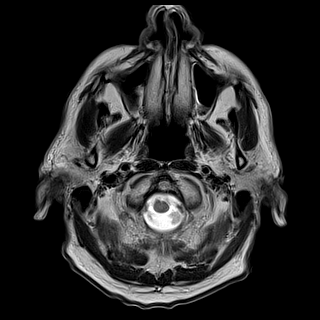
[im 27/27]
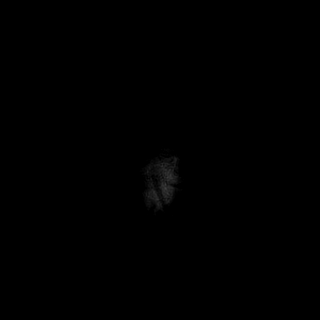

[Series 14: FLAIR · axial · 5.0mm · 0.45mm/px · z∈[-28,+124]mm · 2 of 27 slices shown]
[im 1/27]
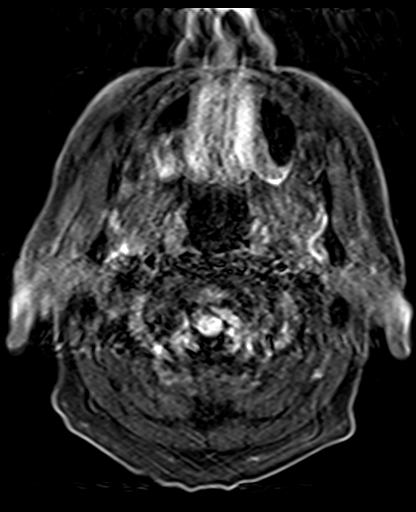
[im 27/27]
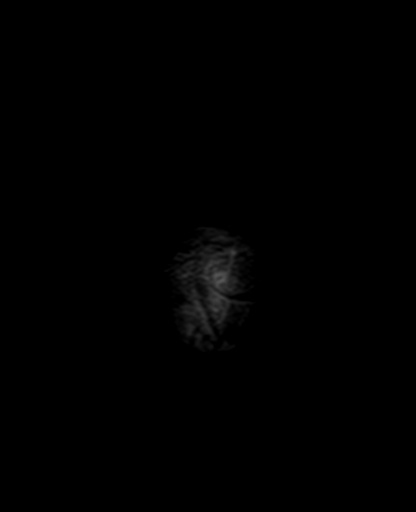

[Series 15: T1 · sagittal · 5.0mm · 0.75mm/px · 2 of 25 slices shown]
[im 1/25]
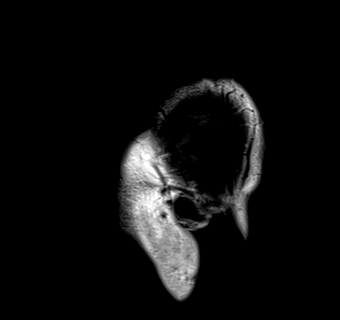
[im 25/25]
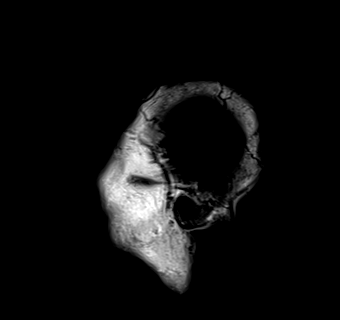

[Series 16: mag_images · axial · 3.0mm · 0.90mm/px · z∈[-40,+132]mm · 5 of 60 slices shown]
[im 1/60]
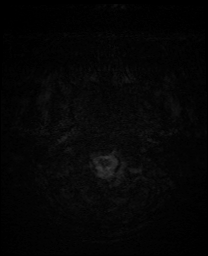
[im 15/60]
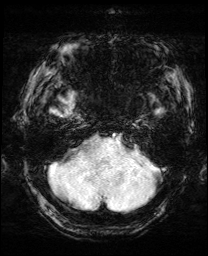
[im 30/60]
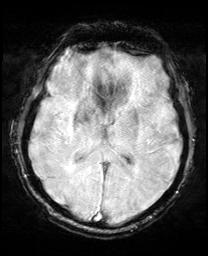
[im 45/60]
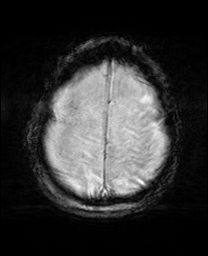
[im 60/60]
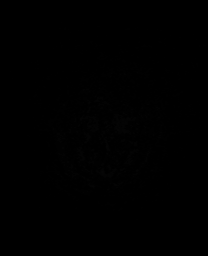

[Series 17: pha_images · axial · 3.0mm · 0.90mm/px · z∈[-34,+126]mm · 4 of 55 slices shown]
[im 1/55]
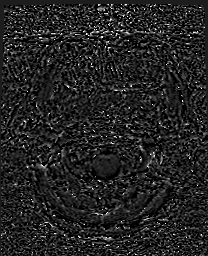
[im 19/55]
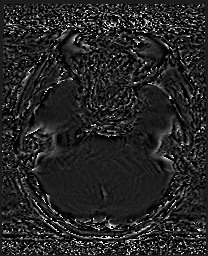
[im 37/55]
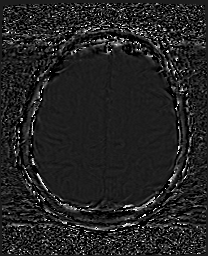
[im 55/55]
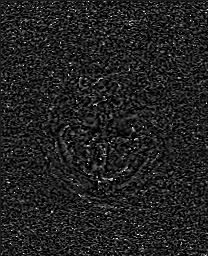

[Series 18: swi_images · axial · 3.0mm · 0.90mm/px · z∈[-40,+132]mm · 5 of 60 slices shown]
[im 1/60]
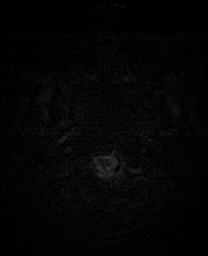
[im 15/60]
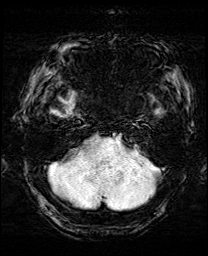
[im 30/60]
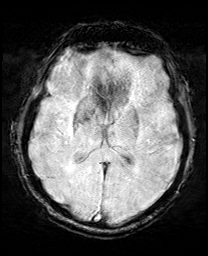
[im 45/60]
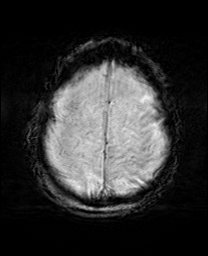
[im 60/60]
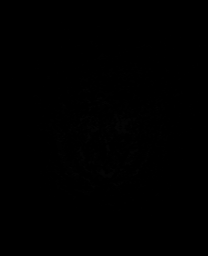

[Series 19: mip_images(sw) · axial · 24.0mm · 0.90mm/px · z∈[-30,+122]mm · 4 of 53 slices shown]
[im 1/53]
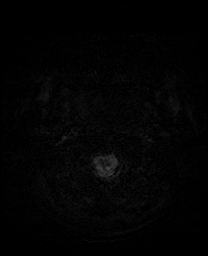
[im 18/53]
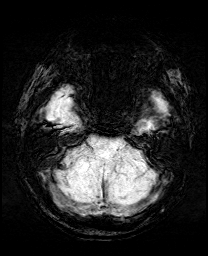
[im 35/53]
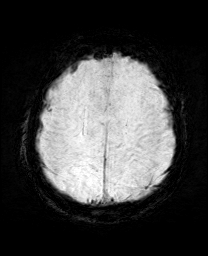
[im 53/53]
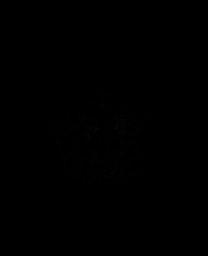

[Series 21: T2 · coronal · 5.0mm · 0.34mm/px · 2 of 29 slices shown (2 of 2)]
[im 1/29]
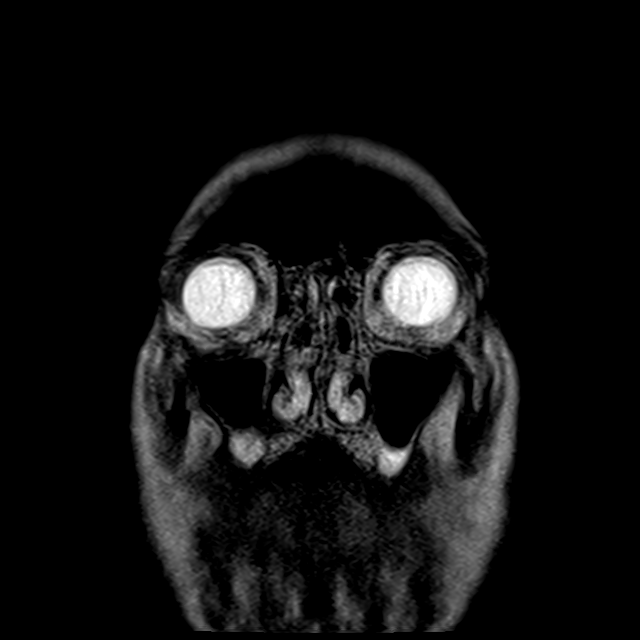
[im 29/29]
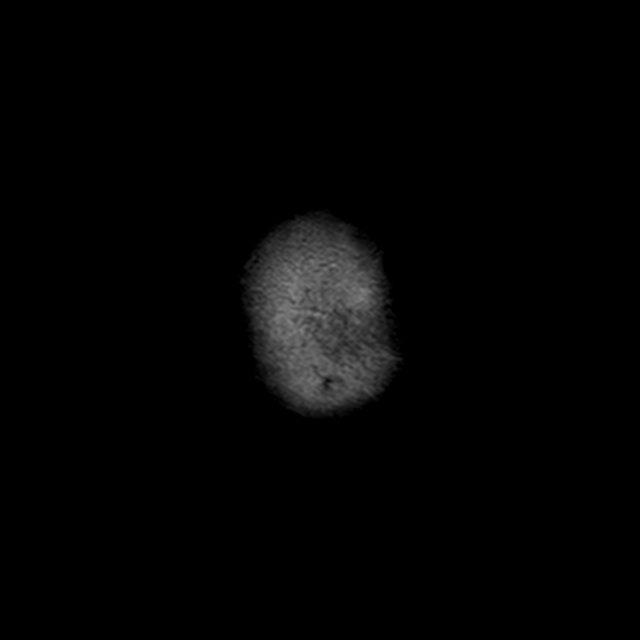

[43 of 48 positions shown; findings below may reference images not displayed]

FINDINGS: Brain: T2 FLAIR sequence is motion degraded. There is no acute
infarction or intracranial hemorrhage. There is no intracranial
mass, mass effect, or edema. There is no hydrocephalus or
extra-axial fluid collection. Patchy T2 hyperintensity in the
supratentorial white matter is nonspecific but likely reflects mild
chronic microvascular ischemic changes. There are chronic small
vessel infarcts of the centrum semiovale and right basal ganglia and
cerebellum.

Vascular: Major vessel flow voids at the skull base are preserved.

Skull and upper cervical spine: Normal marrow signal is preserved.

Sinuses/Orbits: Mild mucosal thickening.  Orbits are unremarkable.

Other: Sella is unremarkable.  Mastoid air cells are clear.
IMPRESSION: No evidence of acute infarction, intracranial hemorrhage, or mass.

Mild chronic microvascular ischemic changes and chronic small vessel
infarcts.

## 2019-11-20 IMAGING — CT CT HEAD CODE STROKE
3 series · 14 of 47 positions shown, 16 images · non-contrast
Comparison: Head CT [DATE].

CLINICAL DATA: Code stroke. 78-year-old male with left side face
and arm numbness.

EXAM:
CT HEAD WITHOUT CONTRAST
TECHNIQUE: Contiguous axial images were obtained from the base of the skull
through the vertex without intravenous contrast.

[Series 3: head 5.0 st · axial · 0.47mm/px · z∈[+1396,+1542]mm · 8 of 35 slices shown, 10 images]
[im 3/35  brain]
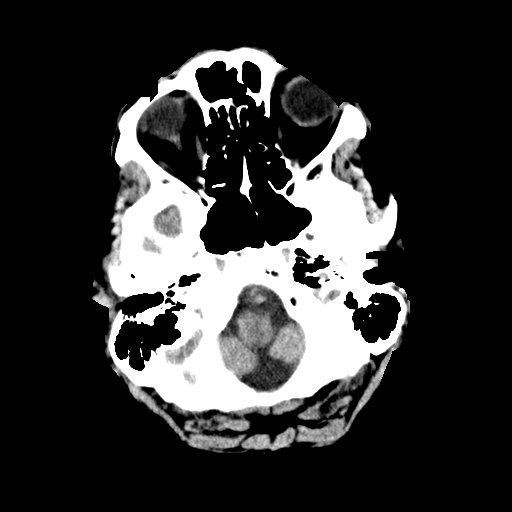
[im 3/35  bone]
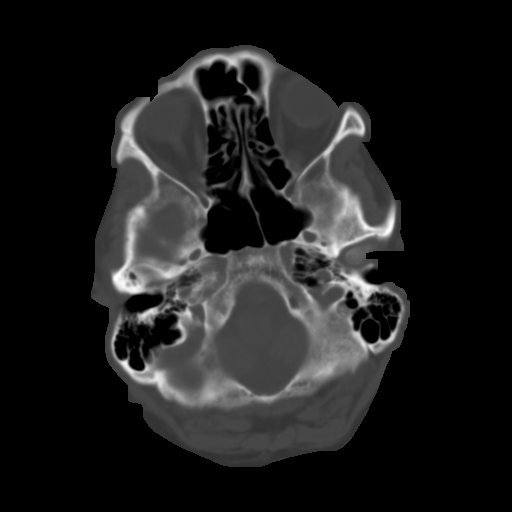
[im 8/35  brain]
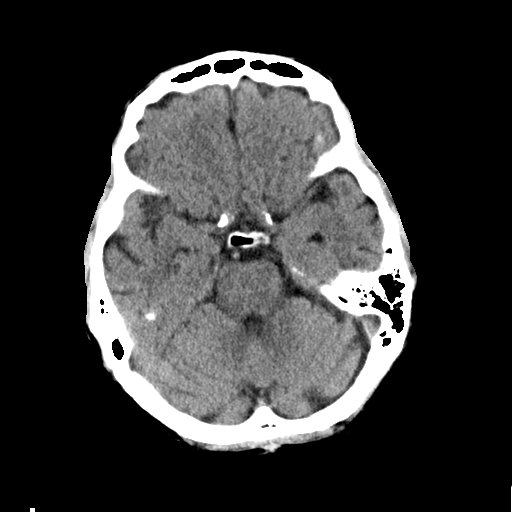
[im 11/35  brain]
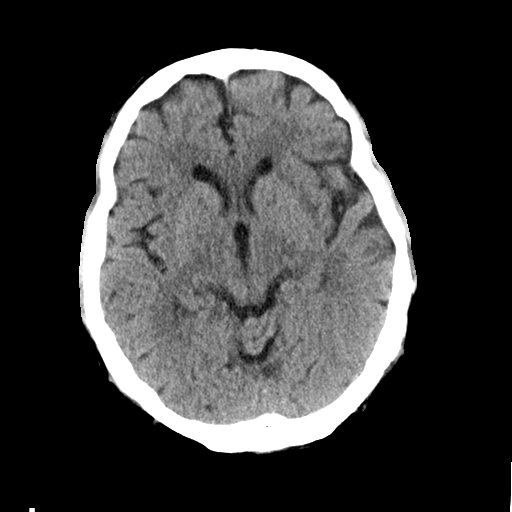
[im 16/35  brain]
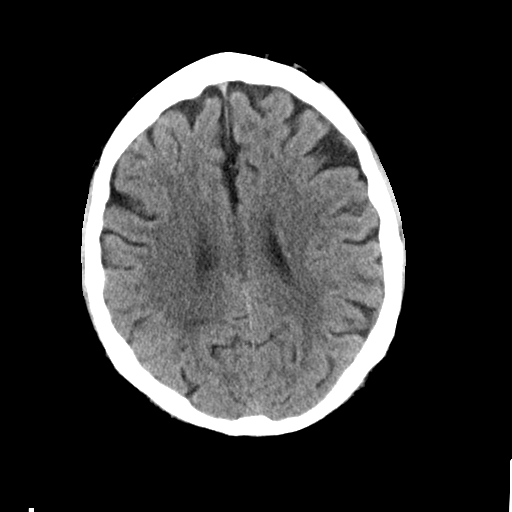
[im 19/35  brain]
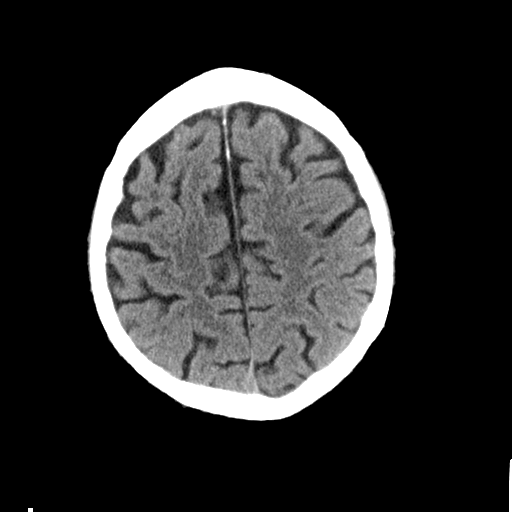
[im 19/35  bone]
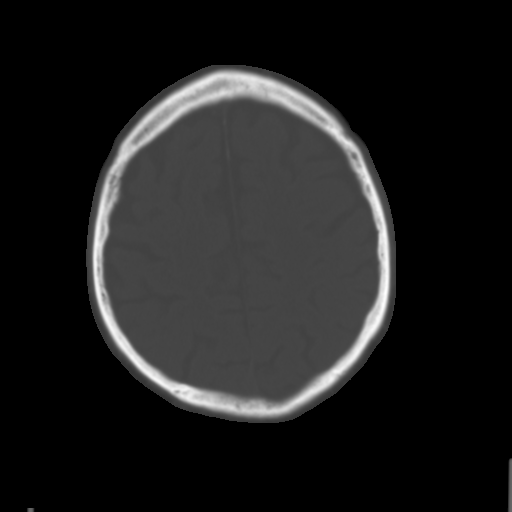
[im 24/35  brain]
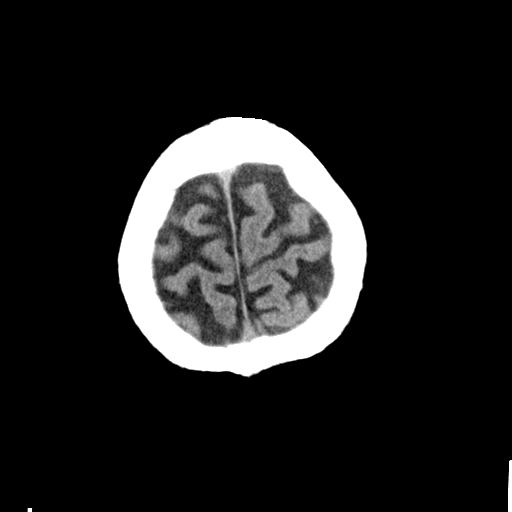
[im 27/35  brain]
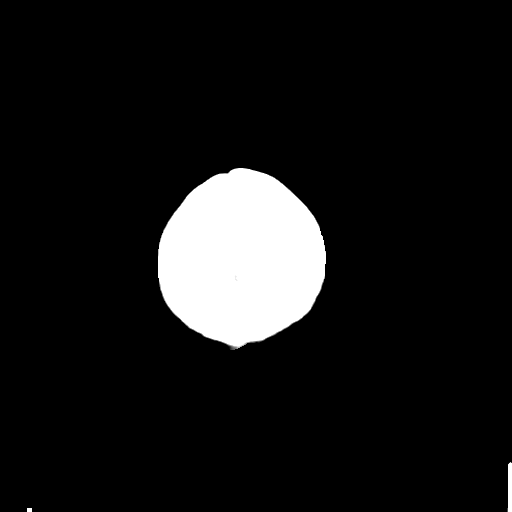
[im 32/35  brain]
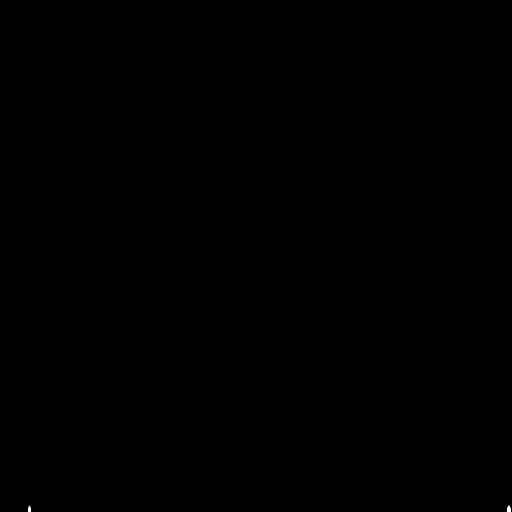

[Series 5: head 3.0 cor st · coronal · 0.32mm/px · 3 of 80 slices shown]
[im 27/80  brain]
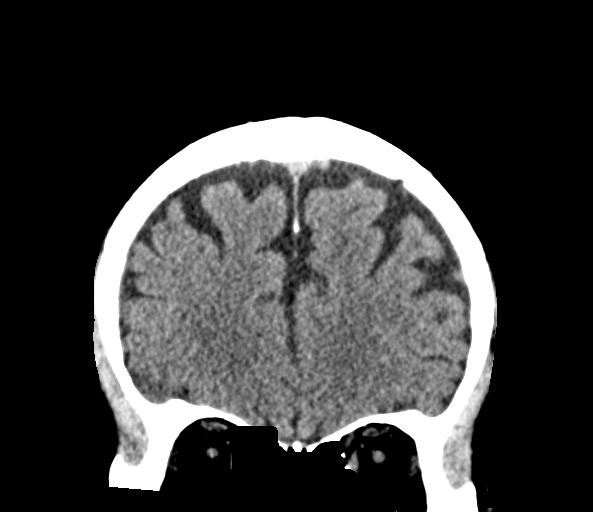
[im 36/80  brain]
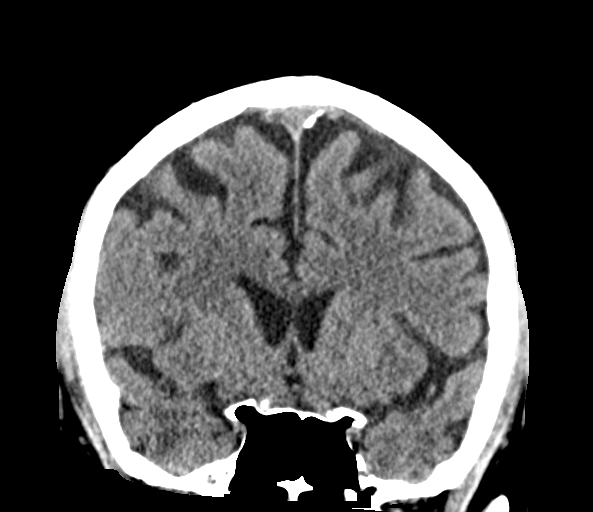
[im 44/80  brain]
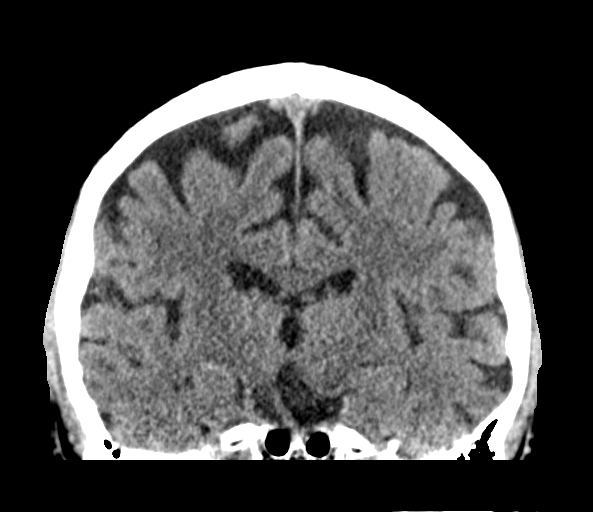

[Series 6: head 3.0 sag st · sagittal · 0.33mm/px · 3 of 67 slices shown]
[im 23/67  brain]
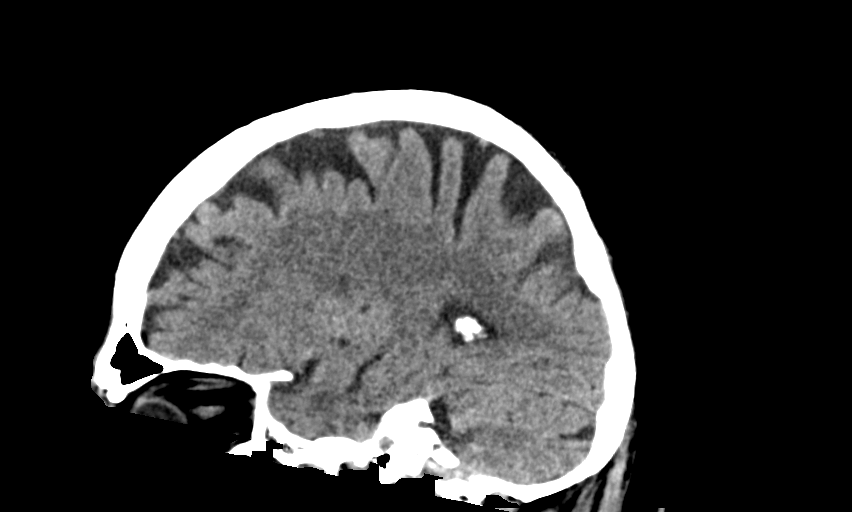
[im 34/67  brain]
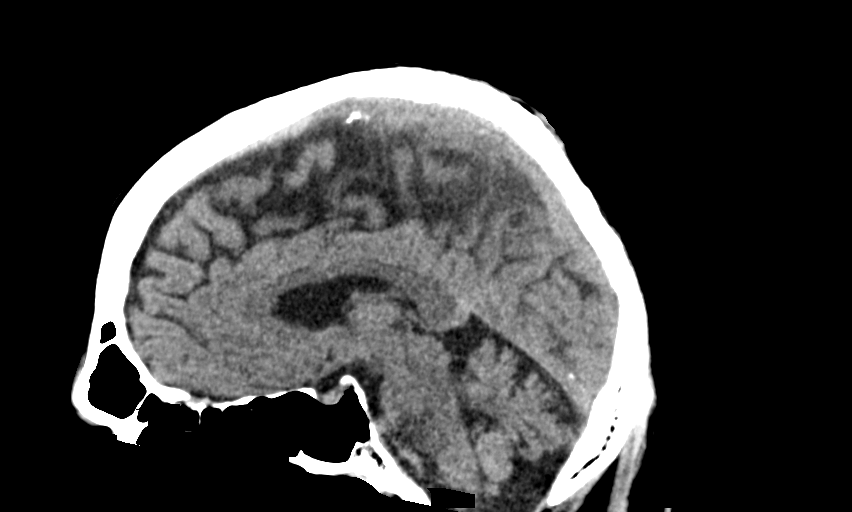
[im 44/67  brain]
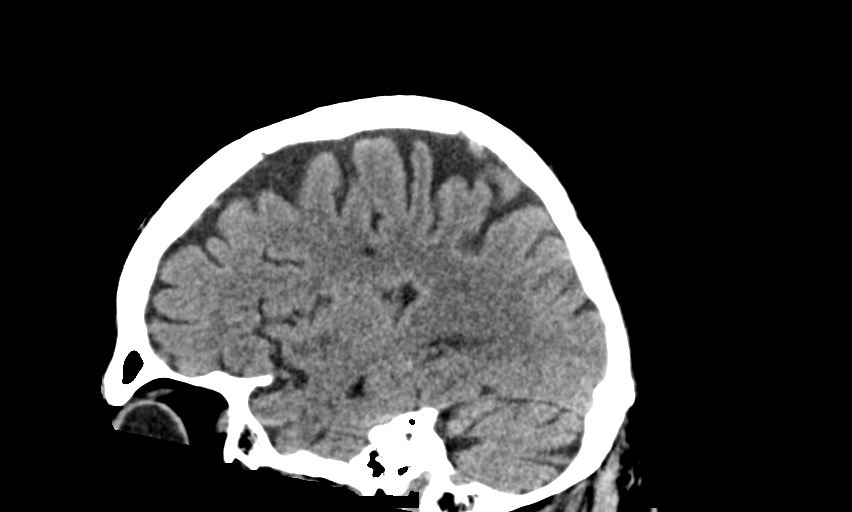

[14 of 47 positions shown; findings below may reference images not displayed]

FINDINGS: Brain: Cerebral volume remains normal for age. No ventriculomegaly.
No midline shift, mass effect, or evidence of intracranial mass
lesion. No acute intracranial hemorrhage identified.

Right basal ganglia and external capsule region hypodensity is new
since [28] but has a chronic appearance (sagittal image 24). Mild
for age scattered white matter hypodensity elsewhere has not
significantly changed. No cortically based acute infarct identified.

Vascular: Calcified atherosclerosis at the skull base. No suspicious
intracranial vascular hyperdensity.

Skull: No acute osseous abnormality identified.

Sinuses/Orbits: Hyperplastic and clear.

Other: Mild chronic soft tissue scarring at the posterior vertex. No
acute orbit or scalp soft tissue findings.

ASPECTS ([place] Stroke Program Early CT Score)

Total score (0-10 with 10 being normal): 10
IMPRESSION: 1. New since [28] but favor chronic lacune in the right
lentiform/external capsule.
2. No superimposed acute cortically based infarct or acute
intracranial hemorrhage identified.
3.  ASPECTS 10.
4. These results were communicated to Dr. RAHAMATHULLAH at [DATE] on
[DATE] by text page via the [employer] messaging system.

## 2019-11-20 MED ORDER — LORAZEPAM 2 MG/ML IJ SOLN
1.0000 mg | Freq: Once | INTRAMUSCULAR | Status: AC | PRN
  Administered 2019-11-20: 17:00:00 1 mg via INTRAVENOUS
  Filled 2019-11-20: qty 1

## 2019-11-20 MED ORDER — STROKE: EARLY STAGES OF RECOVERY BOOK
Freq: Once | Status: AC
  Filled 2019-11-20: qty 1

## 2019-11-20 MED ORDER — GABAPENTIN 100 MG PO CAPS: 200.0000 mg | ORAL_CAPSULE | Freq: Every day | ORAL | Status: DC | PRN

## 2019-11-20 MED ORDER — ATORVASTATIN CALCIUM 40 MG PO TABS
40.0000 mg | ORAL_TABLET | Freq: Every day | ORAL | Status: DC
  Administered 2019-11-20: 22:00:00 40 mg via ORAL
  Filled 2019-11-20: qty 1

## 2019-11-20 MED ORDER — IOHEXOL 350 MG/ML SOLN
75.0000 mL | Freq: Once | INTRAVENOUS | Status: AC | PRN
  Administered 2019-11-20: 11:00:00 75 mL via INTRAVENOUS

## 2019-11-20 MED ORDER — ASPIRIN EC 81 MG PO TBEC
81.0000 mg | DELAYED_RELEASE_TABLET | Freq: Every day | ORAL | Status: DC
  Administered 2019-11-21: 08:00:00 81 mg via ORAL
  Filled 2019-11-20 (×2): qty 1

## 2019-11-20 MED ORDER — PANTOPRAZOLE SODIUM 40 MG PO TBEC
40.0000 mg | DELAYED_RELEASE_TABLET | Freq: Every day | ORAL | Status: DC
  Administered 2019-11-20 – 2019-11-21 (×2): 40 mg via ORAL
  Filled 2019-11-20 (×2): qty 1

## 2019-11-20 MED ORDER — ACETAMINOPHEN 325 MG PO TABS: 650.0000 mg | ORAL_TABLET | ORAL | Status: DC | PRN

## 2019-11-20 MED ORDER — LORAZEPAM 2 MG/ML IJ SOLN
0.2500 mg | Freq: Once | INTRAMUSCULAR | Status: AC | PRN
  Administered 2019-11-20: 18:00:00 0.25 mg via INTRAVENOUS
  Filled 2019-11-20: qty 1

## 2019-11-20 MED ORDER — DOFETILIDE 250 MCG PO CAPS
250.0000 ug | ORAL_CAPSULE | Freq: Two times a day (BID) | ORAL | Status: DC
  Administered 2019-11-20 – 2019-11-21 (×2): 250 ug via ORAL
  Filled 2019-11-20 (×3): qty 1

## 2019-11-20 MED ORDER — INSULIN ASPART 100 UNIT/ML ~~LOC~~ SOLN
0.0000 [IU] | Freq: Three times a day (TID) | SUBCUTANEOUS | Status: DC
  Administered 2019-11-21: 2 [IU] via SUBCUTANEOUS

## 2019-11-20 MED ORDER — ACETAMINOPHEN 160 MG/5ML PO SOLN: 650.0000 mg | ORAL | Status: DC | PRN

## 2019-11-20 MED ORDER — SODIUM CHLORIDE 0.9 % IV SOLN: INTRAVENOUS | Status: DC

## 2019-11-20 MED ORDER — MAGNESIUM OXIDE 400 (241.3 MG) MG PO TABS
800.0000 mg | ORAL_TABLET | Freq: Two times a day (BID) | ORAL | Status: DC
  Administered 2019-11-20 – 2019-11-21 (×2): 800 mg via ORAL
  Filled 2019-11-20 (×2): qty 2

## 2019-11-20 MED ORDER — APIXABAN 2.5 MG PO TABS
2.5000 mg | ORAL_TABLET | Freq: Two times a day (BID) | ORAL | Status: DC
  Administered 2019-11-20 – 2019-11-21 (×2): 2.5 mg via ORAL
  Filled 2019-11-20 (×3): qty 1

## 2019-11-20 MED ORDER — TERAZOSIN HCL 1 MG PO CAPS
1.0000 mg | ORAL_CAPSULE | Freq: Every day | ORAL | Status: DC
  Administered 2019-11-20: 22:00:00 1 mg via ORAL
  Filled 2019-11-20 (×2): qty 1

## 2019-11-20 MED ORDER — SENNOSIDES-DOCUSATE SODIUM 8.6-50 MG PO TABS: 1.0000 | ORAL_TABLET | Freq: Every evening | ORAL | Status: DC | PRN

## 2019-11-20 MED ORDER — ACETAMINOPHEN 650 MG RE SUPP: 650.0000 mg | RECTAL | Status: DC | PRN

## 2019-11-20 NOTE — Progress Notes (Signed)
Patient is admitted in [facility] with TIA. Pt is alert oriented X 4, vital signs within normal range except systolic BP elevated. No c/o pain, pt is working with therapy at this time. Will continue to monitor patient.

## 2019-11-20 NOTE — Procedures (Signed)
Patient Name: ASBERRY LASCOLA  MRN: [id number]  Epilepsy Attending: Lora Havens  Referring Physician/Provider: Dr Fuller Plan Date: 11/20/2019 Duration: 25.62mins  Patient history: 78 year old male with stuttering left-sided numbness. EEG to evaluate for seizure  Level of alertness: awake, asleep  AEDs during EEG study: None  Technical aspects: This EEG study was done with scalp electrodes positioned according to the 10-20 International system of electrode placement. Electrical activity was acquired at a sampling rate of 500Hz  and reviewed with a high frequency filter of 70Hz  and a low frequency filter of 1Hz . EEG data were recorded continuously and digitally stored.   DESCRIPTION:  The posterior dominant rhythm consists of 8-9 Hz activity of moderate voltage (25-35 uV) seen predominantly in posterior head regions, symmetric and reactive to eye opening and eye closing. Sleep was characterized by vertex waves, sleep spindles (12-14Hz ), maximal frontocentral. Physiologic photic driving was seen during photic stimulation. Hyperventilation was not performed.  IMPRESSION: This study is within normal limits. No seizures or epileptiform discharges were seen throughout the recording.  Kahealani Yankovich Barbra Sarks

## 2019-11-20 NOTE — Plan of Care (Signed)
  Problem: Education: Goal: Knowledge of disease or condition will improve Outcome: Progressing Goal: Knowledge of secondary prevention will improve Outcome: Progressing   Problem: Self-Care: Goal: Ability to participate in self-care as condition permits will improve Outcome: Progressing   Problem: Ischemic Stroke/TIA Tissue Perfusion: Goal: Complications of ischemic stroke/TIA will be minimized Outcome: Progressing   

## 2019-11-20 NOTE — ED Provider Notes (Signed)
[facility] EMERGENCY DEPARTMENT Provider Note   CSN: [id number] Arrival date & time: 11/20/19  1010  An emergency department physician performed an initial assessment on this suspected stroke patient at 1038.  History Chief Complaint  Patient presents with  . Numbness    Thomas Marshall is a 78 y.o. male presents emergency department chief complaint of facial numbness and arm numbness.  He has a past medical history of hypertension, coronary artery disease, carotid artery disease, hyperlipidemia, abdominal aortic aneurysm.  He is status post right-sided carotid endarterectomy.  The patient has noticed intermittent left-sided facial numbness involving the lips on the left side for the past 3 to 4 days.  Patient states that he was fine when he woke up this morning but at 10 AM he began having the numbness on the left side along with numbness down the left arm.  He has difficulty characterizing the sensation but states that it felt very abnormal.  He denies any weakness, involvement of the lower extremities, difficulty with sleep speech or swallowing, vision changes, headache, vertigo or ataxia.  Patient denies any chest or abdominal pain.  He is currently anticoagulated on Eliquis.  He states that he still feels an abnormal sensation in the left arm and face at this time.  CODE STROKE INITIATED AT BEDSIDE  HPI     Past Medical History:  Diagnosis Date  . AAA (abdominal aortic aneurysm) ([place])   . Arthritis   . Cancer (HCC)    skin & squamous cell  . Carotid artery disease ([place])    a. s/p R CEA 12/2015.  [name] CKD (chronic kidney disease), stage III    a. per historical labs.  . Coronary artery disease    a. s/p CABG 2004.  . Diabetes mellitus without complication ([place])   . GERD (gastroesophageal reflux disease)   . Hyperlipidemia   . Hypertension   . Medication intolerance Multiple   . PAF (paroxysmal atrial fibrillation) ([place])   . Pneumonia   . Sinus bradycardia     a. baseline HR 50s-60s, also h/o bradycardia on metoprolol and carvedilol    Patient Active Problem List   Diagnosis Date Noted  . TIA (transient ischemic attack) 11/20/2019  . Coagulation disorder ([place]) 10/31/2019  . Arthritis of right sacroiliac joint 07/21/2019  . Right lower quadrant abdominal swelling 10/12/2018  . Cough 12/09/2017  . PVCs (premature ventricular contractions) 10/27/2017  . PVC's (premature ventricular contractions) 09/05/2017  . Bradycardia 09/04/2017  . Cellulitis of right knee 08/11/2017  . Degenerative disc disease, lumbar 07/26/2017  . Atypical angina ([place])   . Right low back pain 05/18/2017  . Rash 04/08/2017  . Paroxysmal atrial fibrillation with RVR ([place]) 09/27/2016  . Persistent atrial fibrillation ([place])   . Erectile dysfunction 09/16/2016  . Hypomagnesemia 05/22/2016  . Essential hypertension 04/21/2016  . CAD (coronary artery disease) 03/02/2016  . Neck abscess 01/10/2016  . Carotid artery disease without cerebral infarction ([place]) 11/15/2015  . Right facial swelling 08/06/2015  . Benign prostatic hyperplasia 08/06/2015  . Abscess of left thigh 07/16/2015  . Cellulitis and abscess of leg 07/09/2015  . CKD (chronic kidney disease) stage 3, GFR 30-59 ml/min (HCC) 05/14/2015  . Carotid stenosis 05/01/2015  . PVD (peripheral vascular disease) ([place]) 04/09/2015  . AAA (abdominal aortic aneurysm) ([place]) 04/09/2015  . S/P CABG x 4 2004 09/10/2014  . Long term current use of anticoagulant therapy 09/04/2013  . Hx of squamous cell carcinoma of skin   .  Type 2 diabetes mellitus with renal manifestations ([place])   . GERD (gastroesophageal reflux disease)   . Hyperlipidemia     Past Surgical History:  Procedure Laterality Date  . ANKLE SURGERY     right fused  . BACK SURGERY     low back   . CHOLECYSTECTOMY N/A 12/07/2013   Procedure: LAPAROSCOPIC CHOLECYSTECTOMY ;  Surgeon: [name], MD;  Location: [facility];  Service: General;  Laterality: N/A;  .  COLONOSCOPY    . CORONARY ARTERY BYPASS GRAFT  2004   [place]  . CORONARY STENT INTERVENTION N/A 05/21/2017   Procedure: Coronary Stent Intervention;  Surgeon: [name], MD;  Location: [facility];  Service: Cardiovascular;  Laterality: N/A;  . ENDARTERECTOMY Right 12/10/2015   Procedure: Right Carotid ENDARTERECTOMY;  Surgeon: Conrad Buncombe, MD;  Location: [facility];  Service: Vascular;  Laterality: Right;  . EXPLORATION POST OPERATIVE OPEN HEART    . FINGER SURGERY     skin graft on rt index  . FINGER SURGERY Right    right index finger.  [name] HERNIA REPAIR Right    RIH  . JOINT REPLACEMENT    . KNEE SURGERY     replacement on both knees  . LEFT HEART CATH AND CORS/GRAFTS ANGIOGRAPHY N/A 05/21/2017   Procedure: Left Heart Cath and Cors/Grafts Angiography;  Surgeon: [name], MD;  Location: [facility];  Service: Cardiovascular;  Laterality: N/A;  . PROSTATE SURGERY    . PVC ABLATION N/A 10/27/2017   Procedure: PVC ABLATION;  Surgeon: Evans Lance, MD;  Location: [facility];  Service: Cardiovascular;  Laterality: N/A;  . TONSILLECTOMY    . URINARY SURGERY     scar tissue       Family History  Problem Relation Age of Onset  . Stroke Mother   . Heart disease Mother   . Heart disease Father   . Hypertension Father   . Hyperlipidemia Father   . Cancer Sister        breast  and skin  . Diabetes Sister   . Hypertension Sister   . Diabetes Brother   . Heart disease Brother   . Hyperlipidemia Brother   . Hypertension Brother   . Early death Neg Hx     Social History   Tobacco Use  . Smoking status: Former Smoker    Packs/day: 1.00    Years: 13.00    Pack years: 13.00    Quit date: 11/30/1970    Years since quitting: 49.0  . Smokeless tobacco: Never Used  Substance Use Topics  . Alcohol use: No    Alcohol/week: 0.0 standard drinks  . Drug use: No    Home Medications Prior to Admission medications   Medication Sig Start Date End  Date Taking? Authorizing Provider  amLODipine (NORVASC) 10 MG tablet Take 1 tablet (10 mg total) by mouth daily. 01/19/17  Yes [name], MD  apixaban (ELIQUIS) 2.5 MG TABS tablet Take 1 tablet (2.5 mg total) by mouth 2 (two) times daily. (0800 & 2000) 08/03/19  Yes Almyra Deforest, PA  aspirin EC 81 MG tablet Take 1 tablet (81 mg total) by mouth daily. 07/27/19  Yes Bhagat, Bhavinkumar, PA  atorvastatin (LIPITOR) 40 MG tablet Take 40 mg by mouth at bedtime.    Yes [provider]  cholecalciferol (VITAMIN D) 1000 UNITS tablet Take 1,000 Units by mouth every evening.    Yes [provider]  cloNIDine (CATAPRES) 0.1 MG  tablet Take 1 tablet (0.1 mg total) by mouth 2 (two) times daily. 07/20/19  Yes Bhagat, Bhavinkumar, PA  Cyanocobalamin 1000 MCG/ML KIT Inject 1,000 mcg as directed every 30 (thirty) days.   Yes [provider]  dofetilide (TIKOSYN) 250 MCG capsule Take 1 capsule (250 mcg total) by mouth 2 (two) times daily. 09/30/16  Yes Reino Bellis B, NP  gabapentin (NEURONTIN) 100 MG capsule Take 2 capsules (200 mg total) by mouth at bedtime. Patient taking differently: Take 200 mg by mouth daily as needed.  06/26/19  Yes Lyndal Pulley, DO  magnesium oxide (MAG-OX) 400 MG tablet Take 1 tablet (400 mg total) by mouth 4 (four) times daily. Patient taking differently: Take 800 mg by mouth 2 (two) times daily.  12/03/16  Yes [name], MD  metFORMIN (GLUCOPHAGE) 1000 MG tablet TAKE 1 TABLET BY MOUTH TWICE DAILY WITH A MEAL Patient taking differently: Take 1,000 mg by mouth 2 (two) times daily with a meal.  10/30/19  Yes [name], MD  nitroGLYCERIN (NITROSTAT) 0.4 MG SL tablet Place 1 tablet (0.4 mg total) under the tongue every 5 (five) minutes as needed for chest pain (x 3 doses). 05/22/16  Yes Bacigalupo, Dionne Bucy, MD  pantoprazole (PROTONIX) 40 MG tablet Take 1 tablet (40 mg total) by mouth daily. 06/08/17 11/20/19 Yes Bhagat, Bhavinkumar, PA  propranolol (INDERAL)  20 MG tablet Take one tablet by mouth PRN for irregular heartbeat.  Maximum 2 tablets in 24 hours. Patient taking differently: Take 20 mg by mouth See admin instructions. Take one tablet by mouth PRN for irregular heartbeat.  Maximum 2 tablets in 24 hours. 03/18/18  Yes Evans Lance, MD  terazosin (HYTRIN) 1 MG capsule Take 1 mg by mouth at bedtime. (2100) 01/07/17  Yes [name], MD  vitamin C (ASCORBIC ACID) 500 MG tablet Take 500 mg by mouth daily.    Yes [provider]  glucose blood test strip Accu-Check Guide Strips. Dx E11.9 Use to test blood glucose level 2 times a day. 08/05/18   [name], MD    Allergies    Lisinopril, Losartan, Nifedipine, Triamterene, Hydralazine hcl, Amoxicillin, Ampicillin, and Carvedilol  Review of Systems   Review of Systems Ten systems reviewed and are negative for acute change, except as noted in the HPI.   Physical Exam Updated Vital Signs BP (!) 175/70 (BP Location: Left Arm)   Pulse (!) 58   Temp 97.6 F (36.4 C) (Oral)   Resp 16   Ht _0  (1.88 m)   Wt 93 kg   SpO2 100%   BMI 26.32 kg/m   Physical Exam Vitals and nursing note reviewed.  Constitutional:      General: He is not in acute distress.    Appearance: He is well-developed. He is not diaphoretic.  HENT:     Head: Normocephalic and atraumatic.  Eyes:     General: No scleral icterus.    Conjunctiva/sclera: Conjunctivae normal.  Cardiovascular:     Rate and Rhythm: Normal rate and regular rhythm.     Heart sounds: Normal heart sounds.  Pulmonary:     Effort: Pulmonary effort is normal. No respiratory distress.     Breath sounds: Normal breath sounds.  Abdominal:     Palpations: Abdomen is soft.     Tenderness: There is no abdominal tenderness.  Musculoskeletal:     Cervical back: Normal range of motion and neck supple.  Skin:    General: Skin is warm  and dry.  Neurological:     Mental Status: He is alert and oriented to person, place, and time.      Cranial Nerves: No cranial nerve deficit.     Sensory: Sensory deficit present.     Motor: No weakness.     Coordination: Coordination normal.     Gait: Gait normal.     Deep Tendon Reflexes: Reflexes normal.  Psychiatric:        Behavior: Behavior normal.     ED Results / Procedures / Treatments   Labs (all labs ordered are listed, but only abnormal results are displayed) Labs Reviewed  CBC - Abnormal; Notable for the following components:      Result Value   RBC 3.52 (*)    Hemoglobin 10.5 (*)    HCT 33.6 (*)    Platelets 120 (*)    All other components within normal limits  DIFFERENTIAL - Abnormal; Notable for the following components:   Eosinophils Absolute 0.6 (*)    All other components within normal limits  COMPREHENSIVE METABOLIC PANEL - Abnormal; Notable for the following components:   Glucose, Bld 195 (*)    BUN 26 (*)    Creatinine, Ser 1.64 (*)    Calcium 8.8 (*)    Total Protein 6.1 (*)    GFR calc non Af Amer 39 (*)    GFR calc Af Amer 45 (*)    All other components within normal limits  I-STAT CHEM 8, ED - Abnormal; Notable for the following components:   BUN 26 (*)    Creatinine, Ser 1.50 (*)    Glucose, Bld 187 (*)    Hemoglobin 10.5 (*)    HCT 31.0 (*)    All other components within normal limits  SARS CORONAVIRUS 2 (TAT 6-24 HRS)  ETHANOL  PROTIME-INR  APTT  RAPID URINE DRUG SCREEN, HOSP PERFORMED  URINALYSIS, ROUTINE W REFLEX MICROSCOPIC  MAGNESIUM  TROPONIN I (HIGH SENSITIVITY)  TROPONIN I (HIGH SENSITIVITY)    EKG None  Radiology CT Code Stroke CTA Head W/WO contrast  Result Date: 11/20/2019 CLINICAL DATA:  Focal neuro deficit.  Left-sided numbness. EXAM: CT ANGIOGRAPHY HEAD AND NECK TECHNIQUE: Multidetector CT imaging of the head and neck was performed using the standard protocol during bolus administration of intravenous contrast. Multiplanar CT image reconstructions and MIPs were obtained to evaluate the vascular anatomy. Carotid  stenosis measurements (when applicable) are obtained utilizing NASCET criteria, using the distal internal carotid diameter as the denominator. CONTRAST:  43m OMNIPAQUE IOHEXOL 350 MG/ML SOLN COMPARISON:  CT head 11/20/2019 FINDINGS: CTA NECK FINDINGS Aortic arch: Atherosclerotic calcification aortic arch without aneurysm or dissection. Atherosclerotic calcification in the proximal great vessels without stenosis. Right carotid system: Mild atherosclerotic disease right carotid bifurcation without stenosis Left carotid system: Calcified and noncalcified plaque at the left carotid bifurcation causing severe stenosis of the internal carotid artery. Estimated 80% diameter stenosis proximal left internal carotid artery. Vertebral arteries: Both vertebral arteries patent to the basilar without significant stenosis. Skeleton: Cervical spondylosis. No acute skeletal abnormality. Median sternotomy. Other neck: Negative for mass or adenopathy Upper chest: Visualized lung apices clear bilaterally. Review of the MIP images confirms the above findings CTA HEAD FINDINGS Anterior circulation: Atherosclerotic calcification in the cavernous carotid bilaterally. No significant stenosis. Anterior and middle cerebral arteries patent bilaterally. Mild atherosclerotic irregularity in the anterior middle cerebral artery branches bilaterally without flow limiting stenosis. No vascular malformation. Posterior circulation: Both vertebral arteries patent to the basilar. Left vertebral artery is  patent. Small left PICA patent. Right PICA not visualized. AICA patent bilaterally. Basilar widely patent. Superior cerebellar and posterior cerebral arteries patent bilaterally. Fetal origin left posterior cerebral artery. Venous sinuses: Normal venous enhancement Anatomic variants: None Review of the MIP images confirms the above findings IMPRESSION: 1. 80% diameter stenosis proximal left internal carotid artery due to atherosclerotic plaque. No  significant right carotid stenosis. No significant vertebral artery stenosis in the neck 2. Negative for intracranial large vessel occlusion. Mild diffuse intracranial atherosclerotic disease without flow limiting stenosis. Electronically Signed   By: Franchot Gallo M.D.   On: 11/20/2019 11:29   CT Code Stroke CTA Neck W/WO contrast  Result Date: 11/20/2019 CLINICAL DATA:  Focal neuro deficit.  Left-sided numbness. EXAM: CT ANGIOGRAPHY HEAD AND NECK TECHNIQUE: Multidetector CT imaging of the head and neck was performed using the standard protocol during bolus administration of intravenous contrast. Multiplanar CT image reconstructions and MIPs were obtained to evaluate the vascular anatomy. Carotid stenosis measurements (when applicable) are obtained utilizing NASCET criteria, using the distal internal carotid diameter as the denominator. CONTRAST:  32m OMNIPAQUE IOHEXOL 350 MG/ML SOLN COMPARISON:  CT head 11/20/2019 FINDINGS: CTA NECK FINDINGS Aortic arch: Atherosclerotic calcification aortic arch without aneurysm or dissection. Atherosclerotic calcification in the proximal great vessels without stenosis. Right carotid system: Mild atherosclerotic disease right carotid bifurcation without stenosis Left carotid system: Calcified and noncalcified plaque at the left carotid bifurcation causing severe stenosis of the internal carotid artery. Estimated 80% diameter stenosis proximal left internal carotid artery. Vertebral arteries: Both vertebral arteries patent to the basilar without significant stenosis. Skeleton: Cervical spondylosis. No acute skeletal abnormality. Median sternotomy. Other neck: Negative for mass or adenopathy Upper chest: Visualized lung apices clear bilaterally. Review of the MIP images confirms the above findings CTA HEAD FINDINGS Anterior circulation: Atherosclerotic calcification in the cavernous carotid bilaterally. No significant stenosis. Anterior and middle cerebral arteries patent  bilaterally. Mild atherosclerotic irregularity in the anterior middle cerebral artery branches bilaterally without flow limiting stenosis. No vascular malformation. Posterior circulation: Both vertebral arteries patent to the basilar. Left vertebral artery is patent. Small left PICA patent. Right PICA not visualized. AICA patent bilaterally. Basilar widely patent. Superior cerebellar and posterior cerebral arteries patent bilaterally. Fetal origin left posterior cerebral artery. Venous sinuses: Normal venous enhancement Anatomic variants: None Review of the MIP images confirms the above findings IMPRESSION: 1. 80% diameter stenosis proximal left internal carotid artery due to atherosclerotic plaque. No significant right carotid stenosis. No significant vertebral artery stenosis in the neck 2. Negative for intracranial large vessel occlusion. Mild diffuse intracranial atherosclerotic disease without flow limiting stenosis. Electronically Signed   By: CFranchot GalloM.D.   On: 11/20/2019 11:29   CT HEAD CODE STROKE WO CONTRAST  Result Date: 11/20/2019 CLINICAL DATA:  Code stroke. 78year old male with left side face and arm numbness. EXAM: CT HEAD WITHOUT CONTRAST TECHNIQUE: Contiguous axial images were obtained from the base of the skull through the vertex without intravenous contrast. COMPARISON:  Head CT 03/04/2016. FINDINGS: Brain: Cerebral volume remains normal for age. No ventriculomegaly. No midline shift, mass effect, or evidence of intracranial mass lesion. No acute intracranial hemorrhage identified. Right basal ganglia and external capsule region hypodensity is new since 2017 but has a chronic appearance (sagittal image 24). Mild for age scattered white matter hypodensity elsewhere has not significantly changed. No cortically based acute infarct identified. Vascular: Calcified atherosclerosis at the skull base. No suspicious intracranial vascular hyperdensity. Skull: No acute osseous abnormality  identified. Sinuses/Orbits:  Hyperplastic and clear. Other: Mild chronic soft tissue scarring at the posterior vertex. No acute orbit or scalp soft tissue findings. ASPECTS [facility] Stroke Program Early CT Score) Total score (0-10 with 10 being normal): 10 IMPRESSION: 1. New since 2017 but favor chronic lacune in the right lentiform/external capsule. 2. No superimposed acute cortically based infarct or acute intracranial hemorrhage identified. 3.  ASPECTS 10. 4. These results were communicated to Dr. Leonel Ramsay at 11:05 am on 11/20/2019 by text page via the [facility] messaging system. Electronically Signed   By: Genevie Ann M.D.   On: 11/20/2019 11:06    Procedures Procedures (including critical care time)  Medications Ordered in ED Medications  LORazepam (ATIVAN) injection 1 mg (has no administration in time range)  magnesium oxide (MAG-OX) tablet 800 mg (has no administration in time range)  pantoprazole (PROTONIX) EC tablet 40 mg (has no administration in time range)  apixaban (ELIQUIS) tablet 2.5 mg (has no administration in time range)  aspirin EC tablet 81 mg (has no administration in time range)  atorvastatin (LIPITOR) tablet 40 mg (has no administration in time range)  dofetilide (TIKOSYN) capsule 250 mcg (has no administration in time range)  gabapentin (NEURONTIN) capsule 200 mg (has no administration in time range)  terazosin (HYTRIN) capsule 1 mg (has no administration in time range)   stroke: mapping our early stages of recovery book (has no administration in time range)  0.9 %  sodium chloride infusion (has no administration in time range)  acetaminophen (TYLENOL) tablet 650 mg (has no administration in time range)    Or  acetaminophen (TYLENOL) 160 MG/5ML solution 650 mg (has no administration in time range)    Or  acetaminophen (TYLENOL) suppository 650 mg (has no administration in time range)  senna-docusate (Senokot-S) tablet 1 tablet (has no administration in time range)  insulin  aspart (novoLOG) injection 0-9 Units (has no administration in time range)  LORazepam (ATIVAN) injection 0.25 mg (has no administration in time range)  iohexol (OMNIPAQUE) 350 MG/ML injection 75 mL (75 mLs Intravenous Contrast Given 11/20/19 1115)    ED Course  I have reviewed the triage vital signs and the nursing notes.  Pertinent labs & imaging results that were available during my care of the patient were reviewed by me and considered in my medical decision making (see chart for details).  Clinical Course as of Nov 19 1538  Mon Nov 20, 2019  1538 Color, Urine: YELLOW [AH]    Clinical Course User Index [AH] Margarita Mail, PA-C   MDM Rules/Calculators/A&P                     VZ:CHYIFOYD/ paresthesia VS: BP (!) 175/70 (BP Location: Left Arm)   Pulse (!) 58   Temp 97.6 F (36.4 C) (Oral)   Resp 16   Ht _0  (1.88 m)   Wt 93 kg   SpO2 100%   BMI 26.32 kg/m  XA:JOINOMV is gathered by patient and EMR. DDX:The differential diagnosis of paresthesias includes but is not limited EH:[id number], diabetic neuropathy, es mellitus, entrapment neuropathy, eg, carpal tunnel syndrome, tarsal tunnel syndrome, meralgia paresthetica) hypocalcemia, multiple sclerosis, spinal cord lesion, nerve root compression, herpes zoster, transient ischemic attack, Guillain-Barr syndrome, trigeminal neuralgia, migraine, partial seizure, reflex sympathetic dystrophy, thoracic outlet syndrome, brachial plexus neuropathy.   Labs: I reviewed the labs which show urinalysis clear, UDS negative, Covid test pending.  CMP shows elevated blood glucose, chronic renal insufficiency with elevated creatinine at baseline.  Normocytic anemia  of insignificant value with mild thrombocytopenia.Ethanol level negative. Imaging: I personally reviewed the images (CT angio head with and without, CT angio neck with and without and CT of the head) which show(s) what appeared to be chronic lacunar infarcts per my interpretation. EKG  sinus rhythm at a rate of 64 with PVCs MDM: Patient called code stroke.  Symptoms resolved in the interim.  He has been seen by Dr. Leonel Ramsay who recommends inpatient admission for TIA. Patient disposition: Admit Patient condition: . The patient appears reasonably screened and/or stabilized for discharge and I doubt any other medical condition or other [facility] requiring further screening, evaluation, or treatment in the ED at this time prior to discharge. I have discussed lab and/or imaging findings with the patient and answered all questions/concerns to the best of my ability. I have discussed return precautions and OP follow up.         Final Clinical Impression(s) / ED Diagnoses Final diagnoses:  TIA (transient ischemic attack)    Rx / DC Orders ED Discharge Orders    None       Margarita Mail, PA-C 11/20/19 1548    Carmin Muskrat, MD 11/21/19 1119

## 2019-11-20 NOTE — H&P (Addendum)
History and Physical    Thomas Marshall GGY:[id number] DOB: October 10, 1941 DOA: 11/20/2019  Referring MD/NP/PA: Margarita Mail, PA-C PCP: [name], MD  Patient coming from: home  Chief Complaint: Numbness and tingling of left arm and lip  I have personally briefly reviewed patient's old medical records in [place]   HPI: Thomas Marshall is a 78 y.o. male with medical history significant of hypertension, hyperlipidemia, PAF on chronic anticoagulation, AAA, carotid artery disease s/p right CEA in 2017, CAD, CKD stage III: And squamous cell skin cancer.  Patient reports symptoms started after he was getting ready to go out this morning.  Reported having numbness and tingling sensation on the left side of his lips and on the left arm.  Denies any complaints of headache, change in vision, weakness, palpitations, chest pain, shortness of breath, nausea, vomiting, diarrhea, fever, or chills.  Upon further questioning it appears that hehas been intermittently having numbness and tingling on the left side of his lip and possibly of the left arm over the last few days.      ED Course: On admission into the emergency department patient was noted and code stroke.  CT scan of the brain revealed new ganglia and external capsule region hypodensity, but thought to be likely chronic lacune.  Labs significant for WBC 4.5, hemoglobin 10.5, platelets 120, BUN 26, and creatinine 1.5.  Urinalysis negative for any acute abnormalities.  Urine drug screen was negative.  CTA of the head and neck revealed 80% stenosis of the proximal left internal carotid artery.  Neurology recommended checking MRI of the brain and EEG although low suspicion for seizure.    Review of Systems  Constitutional: Negative for chills and fever.  HENT: Negative for nosebleeds and sinus pain.   Eyes: Negative for double vision and photophobia.  Respiratory: Negative for cough and shortness of breath.   Cardiovascular: Negative for chest  pain, palpitations and leg swelling.  Gastrointestinal: Negative for abdominal pain and nausea.  Genitourinary: Negative for dysuria and hematuria.  Musculoskeletal: Negative for falls and myalgias.  Skin: Negative for rash.  Neurological: Positive for sensory change. Negative for speech change and headaches.  Psychiatric/Behavioral: Negative for memory loss and substance abuse.    Past Medical History:  Diagnosis Date   AAA (abdominal aortic aneurysm) ([place])    Arthritis    Cancer ([place])    skin & squamous cell   Carotid artery disease ([place])    a. s/p R CEA 12/2015.   CKD (chronic kidney disease), stage III    a. per historical labs.   Coronary artery disease    a. s/p CABG 2004.   Diabetes mellitus without complication (H[facility])    GERD (gastroesophageal reflux disease)    Hyperlipidemia    Hypertension    Medication intolerance Multiple    PAF (paroxysmal atrial fibrillation) (H[facility])    Pneumonia    Sinus bradycardia    a. baseline HR 50s-60s, also h/o bradycardia on metoprolol and carvedilol    Past Surgical History:  Procedure Laterality Date   ANKLE SURGERY     right fused   BACK SURGERY     low back    CHOLECYSTECTOMY N/A 12/07/2013   Procedure: LAPAROSCOPIC CHOLECYSTECTOMY ;  Surgeon: [name], MD;  Location: [place];  Service: General;  Laterality: N/A;   COLONOSCOPY     CORONARY ARTERY BYPASS GRAFT  2004   [place] N/A 05/21/2017   Procedure: Coronary Stent  Intervention;  Surgeon: [name], MD;  Location: [place] [facility];  Service: Cardiovascular;  Laterality: N/A;   ENDARTERECTOMY Right 12/10/2015   Procedure: Right Carotid ENDARTERECTOMY;  Surgeon: Conrad Snowville, MD;  Location: [place];  Service: Vascular;  Laterality: Right;   EXPLORATION POST OPERATIVE OPEN HEART     FINGER SURGERY     skin graft on rt index   FINGER SURGERY Right    right index finger.   HERNIA REPAIR Right    RIH   JOINT  REPLACEMENT     KNEE SURGERY     replacement on both knees   LEFT HEART CATH AND CORS/GRAFTS ANGIOGRAPHY N/A 05/21/2017   Procedure: Left Heart Cath and Cors/Grafts Angiography;  Surgeon: [name], MD;  Location: [place] [facility];  Service: Cardiovascular;  Laterality: N/A;   PROSTATE SURGERY     PVC ABLATION N/A 10/27/2017   Procedure: PVC ABLATION;  Surgeon: Evans Lance, MD;  Location: [place] [facility];  Service: Cardiovascular;  Laterality: N/A;   TONSILLECTOMY     URINARY SURGERY     scar tissue     reports that he quit smoking about 49 years ago. He has a 13.00 pack-year smoking history. He has never used smokeless tobacco. He reports that he does not drink alcohol or use drugs.  Allergies  Allergen Reactions   Lisinopril Swelling and Other (See Comments)    Angioedema.    Losartan Swelling and Other (See Comments)    Lips swell Angioedema    Nifedipine Swelling and Other (See Comments)    Sugar increase   Triamterene Swelling   Hydralazine Hcl Other (See Comments)    Fatigue; poor appetite   Amoxicillin Rash and Other (See Comments)    Has patient had a PCN reaction causing immediate rash, facial/tongue/throat swelling, SOB or lightheadedness with hypotension:No Has patient had a PCN reaction causing severe rash involving mucus membranes or skin necrosis:No Has patient had a PCN reaction that required hospitalization:No Has patient had a PCN reaction occurring within the last 10 years: No If all of the above answers are "NO", then may proceed with Cephalosporin use.    Ampicillin Rash and Other (See Comments)    Has patient had a PCN reaction causing immediate rash, facial/tongue/throat swelling, SOB or lightheadedness with hypotension:No Has patient had a PCN reaction causing severe rash involving mucus membranes or skin necrosis:No Has patient had a PCN reaction that required hospitalization:No Has patient had a PCN reaction occurring within  the last 10 years: No If all of the above answers are "NO", then may proceed with Cephalosporin use.     Carvedilol Other (See Comments)    Low heart rate     Family History  Problem Relation Age of Onset   Stroke Mother    Heart disease Mother    Heart disease Father    Hypertension Father    Hyperlipidemia Father    Cancer Sister        breast  and skin   Diabetes Sister    Hypertension Sister    Diabetes Brother    Heart disease Brother    Hyperlipidemia Brother    Hypertension Brother    Early death Neg Hx     Prior to Admission medications   Medication Sig Start Date End Date Taking? Authorizing Provider  amLODipine (NORVASC) 10 MG tablet Take 1 tablet (10 mg total) by mouth daily. 01/19/17   [name], MD  apixaban (ELIQUIS) 2.5 MG  TABS tablet Take 1 tablet (2.5 mg total) by mouth 2 (two) times daily. (0800 & 2000) 08/03/19   Almyra Deforest, PA  aspirin EC 81 MG tablet Take 1 tablet (81 mg total) by mouth daily. 07/27/19   Bhagat, Crista Luria, PA  atorvastatin (LIPITOR) 40 MG tablet Take 40 mg by mouth at bedtime.     [provider]  cholecalciferol (VITAMIN D) 1000 UNITS tablet Take 1,000 Units by mouth every evening.     [provider]  cloNIDine (CATAPRES) 0.1 MG tablet Take 1 tablet (0.1 mg total) by mouth 2 (two) times daily. 07/20/19   Bhagat, Crista Luria, PA  dofetilide (TIKOSYN) 250 MCG capsule Take 1 capsule (250 mcg total) by mouth 2 (two) times daily. 09/30/16   Cheryln Manly, NP  gabapentin (NEURONTIN) 100 MG capsule Take 2 capsules (200 mg total) by mouth at bedtime. Patient taking differently: Take 200 mg by mouth daily as needed.  06/26/19   Lyndal Pulley, DO  glucose blood test strip Accu-Check Guide Strips. Dx E11.9 Use to test blood glucose level 2 times a day. 08/05/18   [name], MD  magnesium oxide (MAG-OX) 400 MG tablet Take 1 tablet (400 mg total) by mouth 4 (four) times daily. Patient taking differently:  Take 800 mg by mouth 2 (two) times daily.  12/03/16   [name], MD  metFORMIN (GLUCOPHAGE) 1000 MG tablet TAKE 1 TABLET BY MOUTH TWICE DAILY WITH A MEAL 10/30/19   [name], MD  nitroGLYCERIN (NITROSTAT) 0.4 MG SL tablet Place 1 tablet (0.4 mg total) under the tongue every 5 (five) minutes as needed for chest pain (x 3 doses). 05/22/16   Virginia Crews, MD  pantoprazole (PROTONIX) 40 MG tablet Take 1 tablet (40 mg total) by mouth daily. 06/08/17 07/26/19  Leanor Kail, PA  propranolol (INDERAL) 20 MG tablet Take one tablet by mouth PRN for irregular heartbeat.  Maximum 2 tablets in 24 hours. 03/18/18   Evans Lance, MD  terazosin (HYTRIN) 1 MG capsule Take 1 mg by mouth at bedtime. (2100) 01/07/17   [name], MD  vitamin C (ASCORBIC ACID) 500 MG tablet Take 500 mg by mouth daily.     [provider]    Physical Exam:  Constitutional: Elderly male in NAD, calm, comfortable Vitals:   11/20/19 1045 11/20/19 1111 11/20/19 1115 11/20/19 1130  BP: (!) 162/66 (!) 160/68 (!) 154/65 (!) 149/65  Pulse: (!) 57 73 66 63  Resp: 17 12 16 14   Temp:      TempSrc:      SpO2: 100% 100% 100% 100%  Weight:      Height:       Eyes: PERRL, lids and conjunctivae normal ENMT: Mucous membranes are moist. Posterior pharynx clear of any exudate or lesions. Neck: normal, supple, no masses, no thyromegaly Respiratory: clear to auscultation bilaterally, no wheezing, no crackles. Normal respiratory effort. No accessory muscle use.  Cardiovascular: Regular rate and rhythm, no murmurs / rubs / gallops. No extremity edema. 2+ pedal pulses. No carotid bruits.  Abdomen: no tenderness, no masses palpated. No hepatosplenomegaly. Bowel sounds positive.  Musculoskeletal: no clubbing / cyanosis. No joint deformity upper and lower extremities. Good ROM, no contractures. Normal muscle tone.  Skin: no rashes, lesions, ulcers. No induration Neurologic: CN 2-12 grossly intact. Sensation intact,  DTR normal. Strength 5/5 in all 4.  Psychiatric: Normal judgment and insight. Alert and oriented x 3. Normal mood.     Labs on  Admission: I have personally reviewed following labs and imaging studies  CBC: Recent Labs  Lab 11/20/19 1046 11/20/19 1059  WBC 4.5  --   NEUTROABS 2.4  --   HGB 10.5* 10.5*  HCT 33.6* 31.0*  MCV 95.5  --   PLT 120*  --    Basic Metabolic Panel: Recent Labs  Lab 11/20/19 1046 11/20/19 1059  NA 140 141  K 4.3 4.4  CL 106 104  CO2 26  --   GLUCOSE 195* 187*  BUN 26* 26*  CREATININE 1.64* 1.50*  CALCIUM 8.8*  --    GFR: Estimated Creatinine Clearance: 45.7 mL/min (A) (by C-G formula based on SCr of 1.5 mg/dL (H)). Liver Function Tests: Recent Labs  Lab 11/20/19 1046  AST 21  ALT 18  ALKPHOS 53  BILITOT 0.6  PROT 6.1*  ALBUMIN 3.7   No results for input(s): LIPASE, AMYLASE in the last 168 hours. No results for input(s): AMMONIA in the last 168 hours. Coagulation Profile: Recent Labs  Lab 11/20/19 1046  INR 1.2   Cardiac Enzymes: No results for input(s): CKTOTAL, CKMB, CKMBINDEX, TROPONINI in the last 168 hours. BNP (last 3 results) No results for input(s): PROBNP in the last 8760 hours. HbA1C: No results for input(s): HGBA1C in the last 72 hours. CBG: No results for input(s): GLUCAP in the last 168 hours. Lipid Profile: No results for input(s): CHOL, HDL, LDLCALC, TRIG, CHOLHDL, LDLDIRECT in the last 72 hours. Thyroid Function Tests: No results for input(s): TSH, T4TOTAL, FREET4, T3FREE, THYROIDAB in the last 72 hours. Anemia Panel: No results for input(s): VITAMINB12, FOLATE, FERRITIN, TIBC, IRON, RETICCTPCT in the last 72 hours. Urine analysis:    Component Value Date/Time   COLORURINE YELLOW 10/12/2018 1628   APPEARANCEUR Sl Cloudy (A) 10/12/2018 1628   LABSPEC >=1.030 (A) 10/12/2018 1628   PHURINE 6.0 10/12/2018 1628   GLUCOSEU NEGATIVE 10/12/2018 1628   HGBUR TRACE-INTACT (A) 10/12/2018 1628   BILIRUBINUR SMALL  (A) 10/12/2018 1628   KETONESUR TRACE (A) 10/12/2018 1628   PROTEINUR NEGATIVE 08/06/2016 1340   UROBILINOGEN 0.2 10/12/2018 1628   NITRITE NEGATIVE 10/12/2018 1628   LEUKOCYTESUR NEGATIVE 10/12/2018 1628   Sepsis Labs: No results found for this or any previous visit (from the past 240 hour(s)).   Radiological Exams on Admission: CT Code Stroke CTA Head W/WO contrast  Result Date: 11/20/2019 CLINICAL DATA:  Focal neuro deficit.  Left-sided numbness. EXAM: CT ANGIOGRAPHY HEAD AND NECK TECHNIQUE: Multidetector CT imaging of the head and neck was performed using the standard protocol during bolus administration of intravenous contrast. Multiplanar CT image reconstructions and MIPs were obtained to evaluate the vascular anatomy. Carotid stenosis measurements (when applicable) are obtained utilizing NASCET criteria, using the distal internal carotid diameter as the denominator. CONTRAST:  36mL OMNIPAQUE IOHEXOL 350 MG/ML SOLN COMPARISON:  CT head 11/20/2019 FINDINGS: CTA NECK FINDINGS Aortic arch: Atherosclerotic calcification aortic arch without aneurysm or dissection. Atherosclerotic calcification in the proximal great vessels without stenosis. Right carotid system: Mild atherosclerotic disease right carotid bifurcation without stenosis Left carotid system: Calcified and noncalcified plaque at the left carotid bifurcation causing severe stenosis of the internal carotid artery. Estimated 80% diameter stenosis proximal left internal carotid artery. Vertebral arteries: Both vertebral arteries patent to the basilar without significant stenosis. Skeleton: Cervical spondylosis. No acute skeletal abnormality. Median sternotomy. Other neck: Negative for mass or adenopathy Upper chest: Visualized lung apices clear bilaterally. Review of the MIP images confirms the above findings CTA HEAD FINDINGS Anterior circulation:  Atherosclerotic calcification in the cavernous carotid bilaterally. No significant stenosis.  Anterior and middle cerebral arteries patent bilaterally. Mild atherosclerotic irregularity in the anterior middle cerebral artery branches bilaterally without flow limiting stenosis. No vascular malformation. Posterior circulation: Both vertebral arteries patent to the basilar. Left vertebral artery is patent. Small left PICA patent. Right PICA not visualized. AICA patent bilaterally. Basilar widely patent. Superior cerebellar and posterior cerebral arteries patent bilaterally. Fetal origin left posterior cerebral artery. Venous sinuses: Normal venous enhancement Anatomic variants: None Review of the MIP images confirms the above findings IMPRESSION: 1. 80% diameter stenosis proximal left internal carotid artery due to atherosclerotic plaque. No significant right carotid stenosis. No significant vertebral artery stenosis in the neck 2. Negative for intracranial large vessel occlusion. Mild diffuse intracranial atherosclerotic disease without flow limiting stenosis. Electronically Signed   By: Franchot Gallo M.D.   On: 11/20/2019 11:29   CT Code Stroke CTA Neck W/WO contrast  Result Date: 11/20/2019 CLINICAL DATA:  Focal neuro deficit.  Left-sided numbness. EXAM: CT ANGIOGRAPHY HEAD AND NECK TECHNIQUE: Multidetector CT imaging of the head and neck was performed using the standard protocol during bolus administration of intravenous contrast. Multiplanar CT image reconstructions and MIPs were obtained to evaluate the vascular anatomy. Carotid stenosis measurements (when applicable) are obtained utilizing NASCET criteria, using the distal internal carotid diameter as the denominator. CONTRAST:  64mL OMNIPAQUE IOHEXOL 350 MG/ML SOLN COMPARISON:  CT head 11/20/2019 FINDINGS: CTA NECK FINDINGS Aortic arch: Atherosclerotic calcification aortic arch without aneurysm or dissection. Atherosclerotic calcification in the proximal great vessels without stenosis. Right carotid system: Mild atherosclerotic disease right  carotid bifurcation without stenosis Left carotid system: Calcified and noncalcified plaque at the left carotid bifurcation causing severe stenosis of the internal carotid artery. Estimated 80% diameter stenosis proximal left internal carotid artery. Vertebral arteries: Both vertebral arteries patent to the basilar without significant stenosis. Skeleton: Cervical spondylosis. No acute skeletal abnormality. Median sternotomy. Other neck: Negative for mass or adenopathy Upper chest: Visualized lung apices clear bilaterally. Review of the MIP images confirms the above findings CTA HEAD FINDINGS Anterior circulation: Atherosclerotic calcification in the cavernous carotid bilaterally. No significant stenosis. Anterior and middle cerebral arteries patent bilaterally. Mild atherosclerotic irregularity in the anterior middle cerebral artery branches bilaterally without flow limiting stenosis. No vascular malformation. Posterior circulation: Both vertebral arteries patent to the basilar. Left vertebral artery is patent. Small left PICA patent. Right PICA not visualized. AICA patent bilaterally. Basilar widely patent. Superior cerebellar and posterior cerebral arteries patent bilaterally. Fetal origin left posterior cerebral artery. Venous sinuses: Normal venous enhancement Anatomic variants: None Review of the MIP images confirms the above findings IMPRESSION: 1. 80% diameter stenosis proximal left internal carotid artery due to atherosclerotic plaque. No significant right carotid stenosis. No significant vertebral artery stenosis in the neck 2. Negative for intracranial large vessel occlusion. Mild diffuse intracranial atherosclerotic disease without flow limiting stenosis. Electronically Signed   By: Franchot Gallo M.D.   On: 11/20/2019 11:29   CT HEAD CODE STROKE WO CONTRAST  Result Date: 11/20/2019 CLINICAL DATA:  Code stroke. 78 year old male with left side face and arm numbness. EXAM: CT HEAD WITHOUT CONTRAST  TECHNIQUE: Contiguous axial images were obtained from the base of the skull through the vertex without intravenous contrast. COMPARISON:  Head CT 03/04/2016. FINDINGS: Brain: Cerebral volume remains normal for age. No ventriculomegaly. No midline shift, mass effect, or evidence of intracranial mass lesion. No acute intracranial hemorrhage identified. Right basal ganglia and external capsule region hypodensity is  new since 2017 but has a chronic appearance (sagittal image 24). Mild for age scattered white matter hypodensity elsewhere has not significantly changed. No cortically based acute infarct identified. Vascular: Calcified atherosclerosis at the skull base. No suspicious intracranial vascular hyperdensity. Skull: No acute osseous abnormality identified. Sinuses/Orbits: Hyperplastic and clear. Other: Mild chronic soft tissue scarring at the posterior vertex. No acute orbit or scalp soft tissue findings. ASPECTS [facility] Stroke Program Early CT Score) Total score (0-10 with 10 being normal): 10 IMPRESSION: 1. New since 2017 but favor chronic lacune in the right lentiform/external capsule. 2. No superimposed acute cortically based infarct or acute intracranial hemorrhage identified. 3.  ASPECTS 10. 4. These results were communicated to Dr. Leonel Ramsay at 11:05 am on 11/20/2019 by text page via the [facility] messaging system. Electronically Signed   By: Genevie Ann M.D.   On: 11/20/2019 11:06    EKG: Independently reviewed.  Sinus rhythm at 64 bpm with QTc 464  Assessment/Plan Left arm numbness and tingling secondary to suspect TIA: Acute.  Patient presents with complaints of numbness and tingling of his arm.  CT scan revealed signs of a chronic lacunae in the right lentiform/external capsule.  Neurology evaluated and recommended MRI of the brain and EEG. -Admit to a medical telemetry bed -Neurochecks -Allow for permissive hypertension -Check hemoglobin A1c and lipid panel -Follow-up MRI of the brain -Follow-up  EEG -Appreciate neurology consultative services, we will follow-up for further recommendation  Essential hypertension: Home blood pressure medications include clonidine 0.1 mg twice daily and amlodipine 10 mg daily, -Hold home blood pressure medications at this time  Carotid artery stenosis: Patient with previous history of right CEA back in 2017.  CTA reveals 80% stenosis of the proximal left internal carotid artery.  Does not appear to be the acute cause of patient's current symptoms. -Recommend continued outpatient follow-up with vascular surgery  Diabetes mellitus type 2: Patient on oral medications of metformin at 1000 mg twice daily.  Last hemoglobin A1c noted to be 6.6 on 10/16/2019. -Hypoglycemic protocol -Follow-up hemoglobin A1c -Hold Metformin -CBGs before every meal with sensitive SSI  Paroxysmal atrial fibrillation on chronic anticoagulation: Patient currently appears to be sinus rhythm. CHA2DS2-VASc score = at least 4. -Continue Tikosyn and Eliquis  Pancytopenia: Chronic.  On admission hemoglobin 10.9 and platelet count 70 which appears near patient's baseline. -Continue to monitor  Chronic kidney disease stage IIIb: Creatinine 1.5 which appears near patient's baseline. -Continue to monitor  Hyperlipidemia: At home patient on atorvastatin 40 mg daily. -Continue statin  History of AAA: Aneurysm of the distal abdominal aorta measuring 3.5 x 3.4 cm which appeared to be unchanged in recent CT scan of the abdomen from 08/08/2019. -Continue outpatient surveillance  GERD -Continue Protonix  BPH -Continue Terazosin   DVT prophylaxis: Eliquis Code Status: full  Family Communication: No family present at bedside Disposition Plan: Possible discharge home in a.m. Consults called: neurology  Admission status: Observation  Norval Morton MD [facility] Pager [phone]   If 7PM-7AM, please contact night-coverage [email or web address] Password [facility]  11/20/2019,  11:37 AM

## 2019-11-20 NOTE — ED Triage Notes (Signed)
Pt co tingling and numbness in his left arm on and off for last couple of days. Also numbness in his lip left side. Pt denies pain at this time. Denies being sick.

## 2019-11-20 NOTE — Code Documentation (Signed)
78 yo Male coming from home with complaints of sudden onset of left arm tingling that started at 1000 and has resolved. Pt reports having intermittent left facial numbness for the past few days, but the left arm tingling is new. Pt brought in POV. Assessed by EDP and activated as a Code Stroke. Stroke Team met patient in CT. NIHSS 0. CT/CTA completed. Pt deemed TIA alert. Patient at his baseline. Handoff given to [facility], ED RN .

## 2019-11-20 NOTE — Progress Notes (Signed)
EEG complete - results pending 

## 2019-11-20 NOTE — Consult Note (Signed)
Neurology Consultation Reason for Consult: Left-sided numbness and tingling Referring Physician: Vanita Panda, R  CC: Left-sided numbness and tingling  History is obtained from: Patient  HPI: Thomas Marshall is a 78 y.o. male with a history of hypertension, hyperlipidemia, coronary artery disease who presents with intermittent left-sided numbness and tingling.  He states that it has been happening on his face for the past couple of days, and then this morning involved his face and arm.  Lasted for approximately 10 minutes.  He is not certain if he had any weakness associated with it as he did not try to do anything with his hand during that timeframe.  He denies visual change, or unsteady gait.   LKW: 10 AM tpa given?: no, resolution of symptoms    ROS: A 14 point ROS was performed and is negative except as noted in the HPI.   Past Medical History:  Diagnosis Date  . AAA (abdominal aortic aneurysm) ([place])   . Arthritis   . Cancer (HCC)    skin & squamous cell  . Carotid artery disease ([place])    a. s/p R CEA 12/2015.  [name] CKD (chronic kidney disease), stage III    a. per historical labs.  . Coronary artery disease    a. s/p CABG 2004.  . Diabetes mellitus without complication ([place])   . GERD (gastroesophageal reflux disease)   . Hyperlipidemia   . Hypertension   . Medication intolerance Multiple   . PAF (paroxysmal atrial fibrillation) ([place])   . Pneumonia   . Sinus bradycardia    a. baseline HR 50s-60s, also h/o bradycardia on metoprolol and carvedilol     Family History  Problem Relation Age of Onset  . Stroke Mother   . Heart disease Mother   . Heart disease Father   . Hypertension Father   . Hyperlipidemia Father   . Cancer Sister        breast  and skin  . Diabetes Sister   . Hypertension Sister   . Diabetes Brother   . Heart disease Brother   . Hyperlipidemia Brother   . Hypertension Brother   . Early death Neg Hx      Social History:  reports that he quit smoking  about 49 years ago. He has a 13.00 pack-year smoking history. He has never used smokeless tobacco. He reports that he does not drink alcohol or use drugs.   Exam: Current vital signs: BP (!) 154/65   Pulse 66   Temp 98.2 F (36.8 C) (Oral)   Resp 16   Ht 6\' 2"  (1.88 m)   Wt 93 kg   SpO2 100%   BMI 26.32 kg/m  Vital signs in last 24 hours: Temp:  [98.2 F (36.8 C)] 98.2 F (36.8 C) (12/21 1024) Pulse Rate:  [57-73] 66 (12/21 1115) Resp:  [12-17] 16 (12/21 1115) BP: (146-173)/(65-68) 154/65 (12/21 1115) SpO2:  [100 %] 100 % (12/21 1115) Weight:  [93 kg] 93 kg (12/21 1026)   Physical Exam  Constitutional: Appears well-developed and well-nourished.  Psych: Affect appropriate to situation Eyes: No scleral injection HENT: No OP obstrucion MSK: no joint deformities.  Cardiovascular: Normal rate and regular rhythm.  Respiratory: Effort normal, non-labored breathing GI: Soft.  No distension. There is no tenderness.  Skin: WDI  Neuro: Mental Status: Patient is awake, alert, oriented to person, place, month, year, and situation. Patient is able to give a clear and coherent history. No signs of aphasia or neglect Cranial Nerves: II:  Visual Fields are full. Pupils are equal, round, and reactive to light.   III,IV, VI: EOMI without ptosis or diploplia.  V: Facial sensation is symmetric to temperature VII: Facial movement is symmetric.  VIII: hearing is intact to voice X: Uvula elevates symmetrically XI: Shoulder shrug is symmetric. XII: tongue is midline without atrophy or fasciculations.  Motor: Tone is normal. Bulk is normal. 5/5 strength was present in all four extremities.  Sensory: Sensation is symmetric to light touch and temperature in the arms and legs. Deep Tendon Reflexes: 2+ and symmetric in the biceps and patellae.  Plantars: Toes are downgoing bilaterally.  Cerebellar: He has intentional tremor without past-pointing bilaterally.   I have reviewed labs in  epic and the results pertinent to this consultation are: Creatinine 1.5, baseline 1.6  I have reviewed the images obtained: CT/CTA- no acute findings, chronic R BG lacune  Impression: 78 year old male with stuttering left-sided numbness.  Given the stuttering nature of her symptoms, I do think observation would be reasonable, though with him already being on antiplatelet therapy and anticoagulation I would not advance this.  He does need assessment for his lipid control, though this has been relatively well controlled in the past.  With positive symptoms, I will order an EEG as well, but I have low suspicion for seizure and high suspicion for small vessel stuttering infarct.  Recommendations: 1) MRI brain 2) would favor observation 3) lipid panel, A1c 4) continue Eliquis, aspirin 5) allow permissive hypertension for now.  Roland Rack, MD [facility] [phone]  If 7pm- 7am, please page neurology on call as listed in [place].

## 2019-11-20 NOTE — Evaluation (Signed)
Physical Therapy Evaluation Patient Details Name: Thomas Marshall MRN: [id number] DOB: 04-01-1941 Today's Date: 11/20/2019   History of Present Illness  Patient is a 78 y/o male who presents with transient tingling/numbness of LUE and lip. Head CT- negative. CTA-80% stenosis left ICA. Brain MRI pending. Admitted for rule out TIA. PMH includes HTN, HLD, DM. CKD, AAA, PAF.  Clinical Impression  Patient was independent PTA and lives with wife. Today, pt tolerated transfers, gait training and stair training Mod I for safety. Tolerated higher level balance challenges without evidence of imbalance. Scored 20/24 on DGI indicating pt is not at increased risk for falls. Reports all symptoms have resolved. Education re: BeFAST. Pt does not require skilled therapy services as pt is functioning at baseline. All education completed. Discharge from therapy.        Follow Up Recommendations No PT follow up;Supervision - Intermittent    Equipment Recommendations  None recommended by PT    Recommendations for Other Services       Precautions / Restrictions Precautions Precautions: None Restrictions Weight Bearing Restrictions: No      Mobility  Bed Mobility Overal bed mobility: Independent             General bed mobility comments: No assist, HOB flat.  Transfers Overall transfer level: Modified independent Equipment used: None             General transfer comment: Stood from EOB x2 without difficulty.  Ambulation/Gait Ambulation/Gait assistance: Modified independent (Device/Increase time) Gait Distance (Feet): 300 Feet Assistive device: None Gait Pattern/deviations: Step-through pattern;Decreased stride length   Gait velocity interpretation: 1.31 - 2.62 ft/sec, indicative of limited community ambulator General Gait Details: Slow, mostly steady gait, hx of bil knee replacements. tolerated higher level balance challenges, see balance section. HR up to 123 bpm, irregular  rhythm.  Stairs Stairs: Yes Stairs assistance: Supervision Stair Management: Alternating pattern;Two rails;Forwards Number of Stairs: 3 General stair comments: Cues for safety  Wheelchair Mobility    Modified Rankin (Stroke Patients Only) Modified Rankin (Stroke Patients Only) Pre-Morbid Rankin Score: Slight disability Modified Rankin: Slight disability     Balance Overall balance assessment: Needs assistance Sitting-balance support: Feet supported;No upper extremity supported Sitting balance-Leahy Scale: Good     Standing balance support: During functional activity Standing balance-Leahy Scale: Good                   Standardized Balance Assessment Standardized Balance Assessment : Dynamic Gait Index   Dynamic Gait Index Level Surface: Normal Change in Gait Speed: Mild Impairment Gait with Horizontal Head Turns: Normal Gait with Vertical Head Turns: Normal Gait and Pivot Turn: Mild Impairment Step Over Obstacle: Mild Impairment Step Around Obstacles: Normal Steps: Mild Impairment Total Score: 20       Pertinent Vitals/Pain Pain Assessment: No/denies pain    Home Living Family/patient expects to be discharged to:: Private residence Living Arrangements: Spouse/significant other Available Help at Discharge: Family Type of Home: House Home Access: Stairs to enter Entrance Stairs-Rails: Psychiatric nurse of Steps: 2 Home Layout: One level Home Equipment: Clinical cytogeneticist - 2 wheels;Wheelchair - manual;Cane - single point;Bedside commode      Prior Function Level of Independence: Independent         Comments: Cleans, cooks, drives.     Hand Dominance   Dominant Hand: Right    Extremity/Trunk Assessment   Upper Extremity Assessment Upper Extremity Assessment: Defer to OT evaluation;Overall [facility] for tasks assessed    Lower Extremity Assessment Lower  Extremity Assessment: Overall WFL for tasks assessed(hx of bil knee  replacements)       Communication   Communication: No difficulties  Cognition Arousal/Alertness: Awake/alert Behavior During Therapy: WFL for tasks assessed/performed Overall Cognitive Status: Within Functional Limits for tasks assessed                                        General Comments      Exercises     Assessment/Plan    PT Assessment Patent does not need any further PT services  PT Problem List         PT Treatment Interventions      PT Goals (Current goals can be found in the Care Plan section)  Acute Rehab PT Goals Patient Stated Goal: to go home PT Goal Formulation: All assessment and education complete, DC therapy    Frequency     Barriers to discharge        Co-evaluation               AM-PAC PT "6 Clicks" Mobility  Outcome Measure Help needed turning from your back to your side while in a flat bed without using bedrails?: None Help needed moving from lying on your back to sitting on the side of a flat bed without using bedrails?: None Help needed moving to and from a bed to a chair (including a wheelchair)?: None Help needed standing up from a chair using your arms (e.g., wheelchair or bedside chair)?: None Help needed to walk in hospital room?: None Help needed climbing 3-5 steps with a railing? : A Little 6 Click Score: 23    End of Session   Activity Tolerance: Patient tolerated treatment well Patient left: in bed;with call bell/phone within reach;Other (comment)(EEG tech in room) Nurse Communication: Mobility status PT Visit Diagnosis: Other abnormalities of gait and mobility (R26.89)    Time: 1438-1500 PT Time Calculation (min) (ACUTE ONLY): 22 min   Charges:   PT Evaluation $PT Eval Moderate Complexity: 1 Mod          Marisa Severin, PT, DPT Acute Rehabilitation Services Pager [phone] Office [phone]      Marguarite Arbour A Sabra Heck 11/20/2019, 3:48 PM

## 2019-11-21 ENCOUNTER — Observation Stay (HOSPITAL_BASED_OUTPATIENT_CLINIC_OR_DEPARTMENT_OTHER): Payer: No Typology Code available for payment source

## 2019-11-21 DIAGNOSIS — I480 Paroxysmal atrial fibrillation: Secondary | ICD-10-CM

## 2019-11-21 DIAGNOSIS — I6523 Occlusion and stenosis of bilateral carotid arteries: Secondary | ICD-10-CM | POA: Diagnosis not present

## 2019-11-21 DIAGNOSIS — I6522 Occlusion and stenosis of left carotid artery: Secondary | ICD-10-CM

## 2019-11-21 DIAGNOSIS — G459 Transient cerebral ischemic attack, unspecified: Secondary | ICD-10-CM

## 2019-11-21 DIAGNOSIS — R200 Anesthesia of skin: Secondary | ICD-10-CM | POA: Diagnosis not present

## 2019-11-21 LAB — LIPID PANEL
Cholesterol: 114 mg/dL (ref 0–200)
HDL: 38 mg/dL — ABNORMAL LOW (ref >40)
LDL Cholesterol: 62 mg/dL (ref 0–99)
Total CHOL/HDL Ratio: 3 RATIO
Triglycerides: 69 mg/dL (ref <150)
VLDL: 14 mg/dL (ref 0–40)

## 2019-11-21 LAB — CBC
HCT: 32.7 % — ABNORMAL LOW (ref 39.0–52.0)
Hemoglobin: 10.4 g/dL — ABNORMAL LOW (ref 13.0–17.0)
MCH: 30.1 pg (ref 26.0–34.0)
MCHC: 31.8 g/dL (ref 30.0–36.0)
MCV: 94.8 fL (ref 80.0–100.0)
Platelets: 120 10*3/uL — ABNORMAL LOW (ref 150–400)
RBC: 3.45 MIL/uL — ABNORMAL LOW (ref 4.22–5.81)
RDW: 13.7 % (ref 11.5–15.5)
WBC: 4.6 10*3/uL (ref 4.0–10.5)
nRBC: 0 % (ref 0.0–0.2)

## 2019-11-21 LAB — BASIC METABOLIC PANEL
Anion gap: 10 (ref 5–15)
BUN: 21 mg/dL (ref 8–23)
CO2: 27 mmol/L (ref 22–32)
Calcium: 9 mg/dL (ref 8.9–10.3)
Chloride: 102 mmol/L (ref 98–111)
Creatinine, Ser: 1.57 mg/dL — ABNORMAL HIGH (ref 0.61–1.24)
GFR calc Af Amer: 48 mL/min — ABNORMAL LOW (ref >60)
GFR calc non Af Amer: 41 mL/min — ABNORMAL LOW (ref >60)
Glucose, Bld: 164 mg/dL — ABNORMAL HIGH (ref 70–99)
Potassium: 5.2 mmol/L — ABNORMAL HIGH (ref 3.5–5.1)
Sodium: 139 mmol/L (ref 135–145)

## 2019-11-21 LAB — GLUCOSE, CAPILLARY
Glucose-Capillary: 159 mg/dL — ABNORMAL HIGH (ref 70–99)
Glucose-Capillary: 98 mg/dL (ref 70–99)
Glucose-Capillary: 103 mg/dL — ABNORMAL HIGH (ref 70–99)

## 2019-11-21 LAB — HEMOGLOBIN A1C
Hgb A1c MFr Bld: 6.5 % — ABNORMAL HIGH (ref 4.8–5.6)
Mean Plasma Glucose: 139.85 mg/dL

## 2019-11-21 NOTE — Progress Notes (Signed)
Medical screening examination/treatment/procedure(s) were performed by non-physician practitioner and as supervising physician I was immediately available for consultation/collaboration. I agree with above. Everlee Quakenbush, MD   

## 2019-11-21 NOTE — Progress Notes (Addendum)
STROKE TEAM PROGRESS NOTE   HISTORY OF PRESENT ILLNESS (per record) Thomas Marshall is a 78 y.o. male with a history of hypertension, hyperlipidemia, coronary artery disease, Afib (on eliquis) who presents with intermittent left-sided numbness and tingling.  He states that it has been happening on his face for the past couple of days for approximately 10 minutes.   INTERVAL HISTORY No one is at the bedside. MRI neg for stroke. Pt is followed by vascular surgery as out pt and states he has close f/u planned for Jan 12.  I have personally reviewed history of presenting illness, electronic medical records and imaging films in PACS.  CT angiogram shows 80% left carotid stenosis but carotid ultrasound from a year ago had shown no major stenosis in that vessel.  OBJECTIVE Vitals:   11/20/19 2349 11/21/19 0448 11/21/19 0803 11/21/19 1241  BP: 139/68 (!) 145/70 (!) 161/60 (!) 149/74  Pulse: 60 60 64 (!) 56  Resp: _0 Temp: 98.1 F (36.7 C) 98 F (36.7 C)    TempSrc: Oral Oral    SpO2: 97% 98%  97%  Weight:      Height:        CBC:  Recent Labs  Lab 11/20/19 1046 11/20/19 1059 11/21/19 0610  WBC 4.5  --  4.6  NEUTROABS 2.4  --   --   HGB 10.5* 10.5* 10.4*  HCT 33.6* 31.0* 32.7*  MCV 95.5  --  94.8  PLT 120*  --  120*    Basic Metabolic Panel:  Recent Labs  Lab 11/20/19 1046 11/20/19 1059 11/20/19 1504 11/21/19 0610  NA 140 141  --  139  K 4.3 4.4  --  5.2*  CL 106 104  --  102  CO2 26  --   --  27  GLUCOSE 195* 187*  --  164*  BUN 26* 26*  --  21  CREATININE 1.64* 1.50*  --  1.57*  CALCIUM 8.8*  --   --  9.0  MG  --   --  1.9  --     Lipid Panel:     Component Value Date/Time   CHOL 114 11/21/2019 0610   TRIG 69 11/21/2019 0610   HDL 38 (L) 11/21/2019 0610   CHOLHDL 3.0 11/21/2019 0610   VLDL 14 11/21/2019 0610   LDLCALC 62 11/21/2019 0610   HgbA1c:  Lab Results  Component Value Date   HGBA1C 6.5 (H) 11/21/2019   Urine Drug Screen:     Component  Value Date/Time   LABOPIA NONE DETECTED 11/20/2019 1158   COCAINSCRNUR NONE DETECTED 11/20/2019 1158   LABBENZ NONE DETECTED 11/20/2019 1158   AMPHETMU NONE DETECTED 11/20/2019 1158   THCU NONE DETECTED 11/20/2019 1158   LABBARB NONE DETECTED 11/20/2019 1158    Alcohol Level     Component Value Date/Time   ETH <10 11/20/2019 1038    IMAGING   CT Code Stroke CTA Head W/WO contrast  Result Date: 11/20/2019 CLINICAL DATA:  Focal neuro deficit.  Left-sided numbness. EXAM: CT ANGIOGRAPHY HEAD AND NECK TECHNIQUE: Multidetector CT imaging of the head and neck was performed using the standard protocol during bolus administration of intravenous contrast. Multiplanar CT image reconstructions and MIPs were obtained to evaluate the vascular anatomy. Carotid stenosis measurements (when applicable) are obtained utilizing NASCET criteria, using the distal internal carotid diameter as the denominator. CONTRAST:  46m OMNIPAQUE IOHEXOL 350 MG/ML SOLN COMPARISON:  CT head 11/20/2019 FINDINGS: CTA NECK FINDINGS Aortic  arch: Atherosclerotic calcification aortic arch without aneurysm or dissection. Atherosclerotic calcification in the proximal great vessels without stenosis. Right carotid system: Mild atherosclerotic disease right carotid bifurcation without stenosis Left carotid system: Calcified and noncalcified plaque at the left carotid bifurcation causing severe stenosis of the internal carotid artery. Estimated 80% diameter stenosis proximal left internal carotid artery. Vertebral arteries: Both vertebral arteries patent to the basilar without significant stenosis. Skeleton: Cervical spondylosis. No acute skeletal abnormality. Median sternotomy. Other neck: Negative for mass or adenopathy Upper chest: Visualized lung apices clear bilaterally. Review of the MIP images confirms the above findings CTA HEAD FINDINGS Anterior circulation: Atherosclerotic calcification in the cavernous carotid bilaterally. No  significant stenosis. Anterior and middle cerebral arteries patent bilaterally. Mild atherosclerotic irregularity in the anterior middle cerebral artery branches bilaterally without flow limiting stenosis. No vascular malformation. Posterior circulation: Both vertebral arteries patent to the basilar. Left vertebral artery is patent. Small left PICA patent. Right PICA not visualized. AICA patent bilaterally. Basilar widely patent. Superior cerebellar and posterior cerebral arteries patent bilaterally. Fetal origin left posterior cerebral artery. Venous sinuses: Normal venous enhancement Anatomic variants: None Review of the MIP images confirms the above findings IMPRESSION: 1. 80% diameter stenosis proximal left internal carotid artery due to atherosclerotic plaque. No significant right carotid stenosis. No significant vertebral artery stenosis in the neck 2. Negative for intracranial large vessel occlusion. Mild diffuse intracranial atherosclerotic disease without flow limiting stenosis. Electronically Signed   By: Franchot Gallo M.D.   On: 11/20/2019 11:29   CT Code Stroke CTA Neck W/WO contrast  Result Date: 11/20/2019 CLINICAL DATA:  Focal neuro deficit.  Left-sided numbness. EXAM: CT ANGIOGRAPHY HEAD AND NECK TECHNIQUE: Multidetector CT imaging of the head and neck was performed using the standard protocol during bolus administration of intravenous contrast. Multiplanar CT image reconstructions and MIPs were obtained to evaluate the vascular anatomy. Carotid stenosis measurements (when applicable) are obtained utilizing NASCET criteria, using the distal internal carotid diameter as the denominator. CONTRAST:  28m OMNIPAQUE IOHEXOL 350 MG/ML SOLN COMPARISON:  CT head 11/20/2019 FINDINGS: CTA NECK FINDINGS Aortic arch: Atherosclerotic calcification aortic arch without aneurysm or dissection. Atherosclerotic calcification in the proximal great vessels without stenosis. Right carotid system: Mild  atherosclerotic disease right carotid bifurcation without stenosis Left carotid system: Calcified and noncalcified plaque at the left carotid bifurcation causing severe stenosis of the internal carotid artery. Estimated 80% diameter stenosis proximal left internal carotid artery. Vertebral arteries: Both vertebral arteries patent to the basilar without significant stenosis. Skeleton: Cervical spondylosis. No acute skeletal abnormality. Median sternotomy. Other neck: Negative for mass or adenopathy Upper chest: Visualized lung apices clear bilaterally. Review of the MIP images confirms the above findings CTA HEAD FINDINGS Anterior circulation: Atherosclerotic calcification in the cavernous carotid bilaterally. No significant stenosis. Anterior and middle cerebral arteries patent bilaterally. Mild atherosclerotic irregularity in the anterior middle cerebral artery branches bilaterally without flow limiting stenosis. No vascular malformation. Posterior circulation: Both vertebral arteries patent to the basilar. Left vertebral artery is patent. Small left PICA patent. Right PICA not visualized. AICA patent bilaterally. Basilar widely patent. Superior cerebellar and posterior cerebral arteries patent bilaterally. Fetal origin left posterior cerebral artery. Venous sinuses: Normal venous enhancement Anatomic variants: None Review of the MIP images confirms the above findings IMPRESSION: 1. 80% diameter stenosis proximal left internal carotid artery due to atherosclerotic plaque. No significant right carotid stenosis. No significant vertebral artery stenosis in the neck 2. Negative for intracranial large vessel occlusion. Mild diffuse intracranial atherosclerotic disease without  flow limiting stenosis. Electronically Signed   By: Franchot Gallo M.D.   On: 11/20/2019 11:29   MR BRAIN WO CONTRAST  Result Date: 11/20/2019 CLINICAL DATA:  TIA EXAM: MRI HEAD WITHOUT CONTRAST TECHNIQUE: Multiplanar, multiecho pulse  sequences of the brain and surrounding structures were obtained without intravenous contrast. COMPARISON:  None. FINDINGS: Brain: T2 FLAIR sequence is motion degraded. There is no acute infarction or intracranial hemorrhage. There is no intracranial mass, mass effect, or edema. There is no hydrocephalus or extra-axial fluid collection. Patchy T2 hyperintensity in the supratentorial white matter is nonspecific but likely reflects mild chronic microvascular ischemic changes. There are chronic small vessel infarcts of the centrum semiovale and right basal ganglia and cerebellum. Vascular: Major vessel flow voids at the skull base are preserved. Skull and upper cervical spine: Normal marrow signal is preserved. Sinuses/Orbits: Mild mucosal thickening.  Orbits are unremarkable. Other: Sella is unremarkable.  Mastoid air cells are clear. IMPRESSION: No evidence of acute infarction, intracranial hemorrhage, or mass. Mild chronic microvascular ischemic changes and chronic small vessel infarcts. Electronically Signed   By: Macy Mis M.D.   On: 11/20/2019 18:33   EEG adult  Result Date: 11/20/2019 Lora Havens, MD     11/20/2019  4:39 PM Patient Name: Thomas Marshall MRN: [id number] Epilepsy Attending: Lora Havens Referring Physician/Provider: Dr Fuller Plan Date: 11/20/2019 Duration: 25.36mns Patient history: 78year old male with stuttering left-sided numbness. EEG to evaluate for seizure Level of alertness: awake, asleep AEDs during EEG study: None Technical aspects: This EEG study was done with scalp electrodes positioned according to the 10-20 International system of electrode placement. Electrical activity was acquired at a sampling rate of _0  and reviewed with a high frequency filter of _1  and a low frequency filter of _2 . EEG data were recorded continuously and digitally stored. DESCRIPTION:  The posterior dominant rhythm consists of 8-9 Hz activity of moderate voltage (25-35 uV) seen  predominantly in posterior head regions, symmetric and reactive to eye opening and eye closing. Sleep was characterized by vertex waves, sleep spindles (12-_3 ), maximal frontocentral. Physiologic photic driving was seen during photic stimulation. Hyperventilation was not performed. IMPRESSION: This study is within normal limits. No seizures or epileptiform discharges were seen throughout the recording. PLora Havens  CT HEAD CODE STROKE WO CONTRAST  Result Date: 11/20/2019 CLINICAL DATA:  Code stroke. 78year old male with left side face and arm numbness. EXAM: CT HEAD WITHOUT CONTRAST TECHNIQUE: Contiguous axial images were obtained from the base of the skull through the vertex without intravenous contrast. COMPARISON:  Head CT 03/04/2016. FINDINGS: Brain: Cerebral volume remains normal for age. No ventriculomegaly. No midline shift, mass effect, or evidence of intracranial mass lesion. No acute intracranial hemorrhage identified. Right basal ganglia and external capsule region hypodensity is new since 2017 but has a chronic appearance (sagittal image 24). Mild for age scattered white matter hypodensity elsewhere has not significantly changed. No cortically based acute infarct identified. Vascular: Calcified atherosclerosis at the skull base. No suspicious intracranial vascular hyperdensity. Skull: No acute osseous abnormality identified. Sinuses/Orbits: Hyperplastic and clear. Other: Mild chronic soft tissue scarring at the posterior vertex. No acute orbit or scalp soft tissue findings. [facility] Total score (0-10 with 10 being normal): 10 IMPRESSION: 1. New since 2017 but favor chronic lacune in the right lentiform/external capsule. 2. No superimposed acute cortically based infarct or acute intracranial hemorrhage identified. 3.  ASPECTS 10. 4. These results were communicated to Dr. KLeonel Ramsay  at 11:05 am on 11/20/2019 by text page via the [facility] messaging system.  Electronically Signed   By: Genevie Ann M.D.   On: 11/20/2019 11:06    Transthoracic Echocardiogram  00/00/2020 Pending  Bilateral Carotid Dopplers  00/00/2020 Pending  EEG This study is within normal limits. No seizures or epileptiform discharges were seen throughout the recording.   PHYSICAL EXAM Blood pressure (!) 149/74, pulse (!) 56, temperature 98 F (36.7 C), temperature source Oral, resp. rate 17, height '6\' 2"'$  (1.88 m), weight 93 kg, SpO2 97 %.  Physical Exam  Constitutional: Appears well-developed and well-nourished obese elderly caucasian male . No acute distress Psych: Affect appropriate to situation Eyes: No scleral injection HENT: normocephalic MSK: no joint deformities.  Cardiovascular: Normal rate and regular rhythm.  Respiratory: Effort normal, non-labored breathing GI: Soft.  No distension. There is no tenderness.  Skin: WDI  Neuro: Mental Status: Patient is awake, alert, oriented to person, place, month, year, and situation. Patient is able to give a clear and coherent history. No signs of aphasia or neglect Cranial Nerves: II: Visual Fields are full. Pupils are equal, round, and reactive to light.   III,IV, VI: EOMI without ptosis or diploplia.  V: Facial sensation is symmetric to temperature VII: Facial movement is symmetric.  VIII: hearing is intact to voice X: Uvula elevates symmetrically XI: Shoulder shrug is symmetric. XII: tongue is midline without atrophy or fasciculations.  Motor: Tone is normal. Bulk is normal. 5/5 strength was present in all four extremities.  Sensory: Sensation is symmetric to light touch and temperature in the arms and legs. Deep Tendon Reflexes: 2+ and symmetric in the biceps and patellae.  Plantars: Toes are downgoing bilaterally.  Cerebellar: He has intentional tremor without past-pointing bilaterally.  HOME MEDICATIONS:  Medications Prior to Admission  Medication Sig Dispense Refill  . amLODipine (NORVASC) 10 MG  tablet Take 1 tablet (10 mg total) by mouth daily. 30 tablet 11  . apixaban (ELIQUIS) 2.5 MG TABS tablet Take 1 tablet (2.5 mg total) by mouth 2 (two) times daily. (0800 & 2000) 180 tablet 3  . aspirin EC 81 MG tablet Take 1 tablet (81 mg total) by mouth daily. 90 tablet 3  . atorvastatin (LIPITOR) 40 MG tablet Take 40 mg by mouth at bedtime.     . cholecalciferol (VITAMIN D) 1000 UNITS tablet Take 1,000 Units by mouth every evening.     . cloNIDine (CATAPRES) 0.1 MG tablet Take 1 tablet (0.1 mg total) by mouth 2 (two) times daily. 180 tablet 3  . Cyanocobalamin 1000 MCG/ML KIT Inject 1,000 mcg as directed every 30 (thirty) days.    [name] dofetilide (TIKOSYN) 250 MCG capsule Take 1 capsule (250 mcg total) by mouth 2 (two) times daily. 60 capsule 6  . gabapentin (NEURONTIN) 100 MG capsule Take 2 capsules (200 mg total) by mouth at bedtime. (Patient taking differently: Take 200 mg by mouth daily as needed. ) 60 capsule 3  . magnesium oxide (MAG-OX) 400 MG tablet Take 1 tablet (400 mg total) by mouth 4 (four) times daily. (Patient taking differently: Take 800 mg by mouth 2 (two) times daily. ) 360 tablet 3  . metFORMIN (GLUCOPHAGE) 1000 MG tablet TAKE 1 TABLET BY MOUTH TWICE DAILY WITH A MEAL (Patient taking differently: Take 1,000 mg by mouth 2 (two) times daily with a meal. ) 180 tablet 0  . nitroGLYCERIN (NITROSTAT) 0.4 MG SL tablet Place 1 tablet (0.4 mg total) under the tongue every 5 (five) minutes  as needed for chest pain (x 3 doses). 30 tablet 3  . pantoprazole (PROTONIX) 40 MG tablet Take 1 tablet (40 mg total) by mouth daily. 90 tablet 3  . propranolol (INDERAL) 20 MG tablet Take one tablet by mouth PRN for irregular heartbeat.  Maximum 2 tablets in 24 hours. (Patient taking differently: Take 20 mg by mouth See admin instructions. Take one tablet by mouth PRN for irregular heartbeat.  Maximum 2 tablets in 24 hours.) 30 tablet 3  . terazosin (HYTRIN) 1 MG capsule Take 1 mg by mouth at bedtime. (2100)  30 capsule 11  . vitamin C (ASCORBIC ACID) 500 MG tablet Take 500 mg by mouth daily.     [name] glucose blood test strip Accu-Check Guide Strips. Dx E11.9 Use to test blood glucose level 2 times a day. 50 each 11      HOSPITAL MEDICATIONS:  . apixaban  2.5 mg Oral BID  . aspirin EC  81 mg Oral Daily  . atorvastatin  40 mg Oral QHS  . dofetilide  250 mcg Oral Q12H  . insulin aspart  0-9 Units Subcutaneous TID WC  . magnesium oxide  800 mg Oral BID  . pantoprazole  40 mg Oral Daily  . terazosin  1 mg Oral QHS    ALLERGIES Allergies  Allergen Reactions  . Lisinopril Swelling and Other (See Comments)    Angioedema.   . Losartan Swelling and Other (See Comments)    Lips swell Angioedema   . Nifedipine Swelling and Other (See Comments)    Sugar increase  . Triamterene Swelling  . Hydralazine Hcl Other (See Comments)    Fatigue; poor appetite  . Amoxicillin Rash and Other (See Comments)    Has patient had a PCN reaction causing immediate rash, facial/tongue/throat swelling, SOB or lightheadedness with hypotension:No Has patient had a PCN reaction causing severe rash involving mucus membranes or skin necrosis:No Has patient had a PCN reaction that required hospitalization:No Has patient had a PCN reaction occurring within the last 10 years: No If all of the above answers are "NO", then may proceed with Cephalosporin use.   . Ampicillin Rash and Other (See Comments)    Has patient had a PCN reaction causing immediate rash, facial/tongue/throat swelling, SOB or lightheadedness with hypotension:No Has patient had a PCN reaction causing severe rash involving mucus membranes or skin necrosis:No Has patient had a PCN reaction that required hospitalization:No Has patient had a PCN reaction occurring within the last 10 years: No If all of the above answers are "NO", then may proceed with Cephalosporin use.    . Carvedilol Other (See Comments)    Low heart rate     PAST MEDICAL  HISTORY Past Medical History:  Diagnosis Date  . AAA (abdominal aortic aneurysm) ([place])   . Arthritis   . Cancer (HCC)    skin & squamous cell  . Carotid artery disease ([place])    a. s/p R CEA 12/2015.  [name] CKD (chronic kidney disease), stage III    a. per historical labs.  . Coronary artery disease    a. s/p CABG 2004.  . Diabetes mellitus without complication ([place])   . GERD (gastroesophageal reflux disease)   . Hyperlipidemia   . Hypertension   . Medication intolerance Multiple   . PAF (paroxysmal atrial fibrillation) ([place])   . Pneumonia   . Sinus bradycardia    a. baseline HR 50s-60s, also h/o bradycardia on metoprolol and carvedilol    SURGICAL HISTORY Past Surgical  History:  Procedure Laterality Date  . ANKLE SURGERY     right fused  . BACK SURGERY     low back   . CHOLECYSTECTOMY N/A 12/07/2013   Procedure: LAPAROSCOPIC CHOLECYSTECTOMY ;  Surgeon: [name], MD;  Location: [facility];  Service: General;  Laterality: N/A;  . COLONOSCOPY    . CORONARY ARTERY BYPASS GRAFT  2004   [place]  . CORONARY STENT INTERVENTION N/A 05/21/2017   Procedure: Coronary Stent Intervention;  Surgeon: [name], MD;  Location: [facility];  Service: Cardiovascular;  Laterality: N/A;  . ENDARTERECTOMY Right 12/10/2015   Procedure: Right Carotid ENDARTERECTOMY;  Surgeon: Conrad Weirton, MD;  Location: [facility];  Service: Vascular;  Laterality: Right;  . EXPLORATION POST OPERATIVE OPEN HEART    . FINGER SURGERY     skin graft on rt index  . FINGER SURGERY Right    right index finger.  [name] HERNIA REPAIR Right    RI[facility]  . JOINT REPLACEMENT    . KNEE SURGERY     replacement on both knees  . LEFT HEART CATH AND CORS/GRAFTS ANGIOGRAPHY N/A 05/21/2017   Procedure: Left Heart Cath and Cors/Grafts Angiography;  Surgeon: [name], MD;  Location: [facility];  Service: Cardiovascular;  Laterality: N/A;  . PROSTATE SURGERY    . PVC ABLATION N/A 10/27/2017   Procedure: PVC  ABLATION;  Surgeon: Evans Lance, MD;  Location: [facility];  Service: Cardiovascular;  Laterality: N/A;  . TONSILLECTOMY    . URINARY SURGERY     scar tissue    FAMILY HISTORY Family History  Problem Relation Age of Onset  . Stroke Mother   . Heart disease Mother   . Heart disease Father   . Hypertension Father   . Hyperlipidemia Father   . Cancer Sister        breast  and skin  . Diabetes Sister   . Hypertension Sister   . Diabetes Brother   . Heart disease Brother   . Hyperlipidemia Brother   . Hypertension Brother   . Early death Neg Hx     SOCIAL HISTORY  reports that he quit smoking about 49 years ago. He has a 13.00 pack-year smoking history. He has never used smokeless tobacco. He reports that he does not drink alcohol or use drugs.  ASSESSMENT/PLAN  78 year old male with stuttering left-sided numbness.  Given the stuttering nature of her symptoms, I do think observation would be reasonable, though with him already being on antiplatelet therapy and anticoagulation I would not advance this.  He does need assessment for his lipid control, though this has been relatively well controlled in the past. EEG was neg for seizures.    TIA-Cheiro oral paresthesias -: likely from small vessel disease but has known h/o carotid artery disease  Resultant  Wax/waning right arm/face numbness  Code Stroke CT Head -    ASPECTS10  CT head - no acute finding  MRI head- neg for acute findings  MRA head n/a  CTA H&N - LICA 21%. RICA <50% post CEA  CT Perfusion- not done  Carotid Doppler - pending  2D Echo - pending  Sars Corona Virus 2  neg  LDL - 62    Component Value Date/Time   LDLCALC 62 11/21/2019 0610     HgbA1c - 6.5  UDS neg  VTE prophylaxis - on Eliquis Diet  Diet Order  Diet heart healthy/carb modified Room service appropriate? Yes; Fluid consistency: Thin  Diet effective now               ASA + ELiquis 2.63m prior to  admission, now on ASA + Eliquis 2.5  Patient counseled to be compliant with his antithrombotic medications  Ongoing aggressive stroke risk factor management  Therapy recommendations:  pending  Disposition:  Pending  Hypertension  Home BP meds: Amlodipine, Clonidine, Tikosyn (for arrhythmia), inderal, hytrin  Current BP meds: Hytrin, Tikosyn  Stable . Permissive hypertension (OK if < 220/120) but gradually normalize in 5-7 days . Long-term BP goal normotensive  Hyperlipidemia  Home Lipid lowering medication: Lipitor 42m LDL 62, goal < 70  Current lipid lowering medication: Lipitor 40 mg daily  Continue statin at discharge  Diabetes  Home diabetic meds: metformin  Current diabetic meds: SSI   HgbA1c 6.5 , goal < 7.0 Recent Labs    11/20/19 2134 11/21/19 0609 11/21/19 1103  GLUCAP 137* 159* 98     Other Stroke Risk Factors  Advanced age  Hx stroke/TIA  Coronary artery disease   Atrial fibrillation  Carotid artery disease   Other Active Problems  Carotid stenosis  Hospital day # 0  Desiree Metzger-Cihelka, ARNP-C, ANVP-BC Pager: [phone] have personally obtained history,examined this patient, reviewed notes, independently viewed imaging studies, participated in medical decision making and plan of care.ROS completed by me personally and pertinent positives fully documented  I have made any additions or clarifications directly to the above note. Agree with note above.  He presented with recurrent episodes of Cheiro oral paresthesias with one episode of left upper extremity paresthesias yesterday likely TIA from small vessel disease.  He also has atrial fibrillation and is on Eliquis.  Continue ongoing stroke work-up and aggressive risk factor modification.  Continue Eliquis.  CT angiogram suggest 80% left carotid stenosis which is surprising since carotid ultrasound a year ago seemed fine hence we will repeat carotid ultrasound to confirm this  and he could follow-up as an outpatient with vascular surgeon to further address this issue of revascularization if needed.  Greater than 50% time during this 35-minute visit was spent on counseling and coordination of care about his TIAs, carotid stenosis and atrial fibrillation and answering questions PrAntony ContrasMD Medical Director [facility] Pager: [phone]/22/2020 2:44 PM  To contact Stroke Continuity provider, please refer to [email or web address] hours, contact General Neurology

## 2019-11-21 NOTE — TOC Initial Note (Signed)
Transition of Care [facility]) - Initial/Assessment Note    Patient Details  Name: Thomas Marshall MRN: [id number] Date of Birth: 11-08-1941  Transition of Care [facility]) CM/SW Contact:    Marilu Favre, RN Phone Number: 11/21/2019, 1:57 PM  Clinical Narrative:                  Patient from home with wife. Confirmed face sheet information. PCP Dr Cathlean Cower  Expected Discharge Plan: Home/Self Care Barriers to Discharge: Continued Medical Work up   Patient Goals and CMS Choice Patient states their goals for this hospitalization and ongoing recovery are:: to return to home CMS Medicare.gov Compare Post Acute Care list provided to:: Patient Choice offered to / list presented to : NA  Expected Discharge Plan and Services Expected Discharge Plan: Home/Self Care       Living arrangements for the past 2 months: Single Family Home                 DME Arranged: N/A         HH Arranged: NA          Prior Living Arrangements/Services Living arrangements for the past 2 months: Single Family Home Lives with:: Spouse Patient language and need for interpreter reviewed:: Yes Do you feel safe going back to the place where you live?: Yes      Need for Family Participation in Patient Care: Yes (Comment) Care giver support system in place?: Yes (comment)   Criminal Activity/Legal Involvement Pertinent to Current Situation/Hospitalization: No - Comment as needed  Activities of Daily Living Home Assistive Devices/Equipment: None ADL Screening (condition at time of admission) Patient's cognitive ability adequate to safely complete daily activities?: Yes Is the patient deaf or have difficulty hearing?: No Does the patient have difficulty seeing, even when wearing glasses/contacts?: No Does the patient have difficulty concentrating, remembering, or making decisions?: No Patient able to express need for assistance with ADLs?: Yes Does the patient have difficulty dressing or bathing?:  No Independently performs ADLs?: No Communication: Independent Dressing (OT): Independent Grooming: Independent Feeding: Independent Bathing: Independent Toileting: Independent In/Out Bed: Independent Walks in Home: Independent Does the patient have difficulty walking or climbing stairs?: No Weakness of Legs: None Weakness of Arms/Hands: None  Permission Sought/Granted   Permission granted to share information with : Yes, Verbal Permission Granted  Share Information with NAME: margaret wife           Emotional Assessment Appearance:: Appears stated age Attitude/Demeanor/Rapport: Engaged Affect (typically observed): Accepting Orientation: : Oriented to Self, Oriented to Place, Oriented to  Time, Oriented to Situation Alcohol / Substance Use: Not Applicable Psych Involvement: No (comment)  Admission diagnosis:  TIA (transient ischemic attack) [G45.9] Patient Active Problem List   Diagnosis Date Noted  . TIA (transient ischemic attack) 11/20/2019  . Pancytopenia ([place]) 11/20/2019  . Left sided numbness   . Coagulation disorder ([place]) 10/31/2019  . Arthritis of right sacroiliac joint 07/21/2019  . Right lower quadrant abdominal swelling 10/12/2018  . Cough 12/09/2017  . PVCs (premature ventricular contractions) 10/27/2017  . PVC's (premature ventricular contractions) 09/05/2017  . Bradycardia 09/04/2017  . Cellulitis of right knee 08/11/2017  . Degenerative disc disease, lumbar 07/26/2017  . Atypical angina ([place])   . Right low back pain 05/18/2017  . Rash 04/08/2017  . Paroxysmal atrial fibrillation with RVR ([place]) 09/27/2016  . Persistent atrial fibrillation ([place])   . Erectile dysfunction 09/16/2016  . Hypomagnesemia 05/22/2016  . Essential hypertension 04/21/2016  .  CAD (coronary artery disease) 03/02/2016  . Neck abscess 01/10/2016  . Carotid artery disease without cerebral infarction ([place]) 11/15/2015  . Right facial swelling 08/06/2015  . Benign prostatic  hyperplasia 08/06/2015  . Abscess of left thigh 07/16/2015  . Cellulitis and abscess of leg 07/09/2015  . CKD (chronic kidney disease) stage 3, GFR 30-59 ml/min (HCC) 05/14/2015  . Carotid stenosis 05/01/2015  . PVD (peripheral vascular disease) ([place]) 04/09/2015  . AAA (abdominal aortic aneurysm) ([place]) 04/09/2015  . S/P CABG x 4 2004 09/10/2014  . Long term current use of anticoagulant therapy 09/04/2013  . Hx of squamous cell carcinoma of skin   . Type 2 diabetes mellitus with renal manifestations ([place])   . GERD (gastroesophageal reflux disease)   . Hyperlipidemia    PCP:  [name], MD Pharmacy:   [facility], [place] [address] Phone: [phone] Fax: [phone]  [place], [place] - [address] [facility]. [address] [place] [address] Phone: [phone] Fax: [phone]     Social Determinants of Health (SDOH) Interventions    Readmission Risk Interventions No flowsheet data found.

## 2019-11-21 NOTE — Discharge Instructions (Signed)
Transient Ischemic Attack ° °A transient ischemic attack (TIA) is a "warning stroke" that causes stroke-like symptoms that go away quickly. A TIA does not cause lasting damage to the brain. But having a TIA is a sign that you may be at risk for a stroke. Lifestyle changes and medical treatments can help prevent a stroke. °It is important to know the symptoms of a TIA and what to do. Get help right away, even if your symptoms go away. The symptoms of a TIA are the same as those of a stroke. They can happen fast, and they usually go away within minutes or hours. They can include: °· Weakness or loss of feeling in your face, arm, or leg. This often happens on one side of your body. °· Trouble walking. °· Trouble moving your arms or legs. °· Trouble talking or understanding what people are saying. °· Trouble seeing. °· Seeing two of one object (double vision). °· Feeling dizzy. °· Feeling confused. °· Loss of balance or coordination. °· Feeling sick to your stomach (nauseous) and throwing up (vomiting). °· A very bad headache for no reason. °What increases the risk? °Certain things may make you more likely to have a TIA. Some of these are things that you can change, such as: °· Being very overweight (obese). °· Using products that contain nicotine or tobacco, such as cigarettes and e-cigarettes. °· Taking birth control pills. °· Not being active. °· Drinking too much alcohol. °· Using drugs. °Other risk factors include: °· Having an irregular heartbeat (atrial fibrillation). °· Being African American or Hispanic. °· Having had blood clots, stroke, TIA, or heart attack in the past. °· Being a woman with a history of high blood pressure in pregnancy (preeclampsia). °· Being over the age of 60. °· Being male. °· Having family history of stroke. °· Having the following diseases or conditions: °? High blood pressure. °? High cholesterol. °? Diabetes. °? Heart disease. °? Sickle cell disease. °? Sleep apnea. °? Migraine  headache. °? Long-term (chronic) diseases that cause soreness and swelling (inflammation). °? Disorders that affect how your blood clots. °Follow these instructions at home: °Medicines ° °· Take over-the-counter and prescription medicines only as told by your doctor. °· If you were told to take aspirin or another medicine to thin your blood, take it exactly as told by your doctor. °? Taking too much of the medicine can cause bleeding. °? Taking too little of the medicine may not work to treat the problem. °Eating and drinking ° °· Eat 5 or more servings of fruits and vegetables each day. °· Follow instructions from your doctor about your diet. You may need to follow a certain diet to help lower your risk of having a stroke. You may need to: °? Eat a diet that is low in fat and salt. °? Eat foods that contain a lot of fiber. °? Limit the amount of carbohydrates and sugar in your diet. °· Limit alcohol intake to 1 drink a day for nonpregnant women and 2 drinks a day for men. One drink equals 12 oz of beer, 5 oz of wine, or 1½ oz of hard liquor. °General instructions °· Keep a healthy weight. °· Stay active. Try to get at least 30 minutes of activity on all or most days. °· Find out if you have a condition called sleep apnea. Get treatment if needed. °· Do not use any products that contain nicotine or tobacco, such as cigarettes and e-cigarettes. If you need help quitting,   ask your doctor. °· Do not abuse drugs. °· Keep all follow-up visits as told by your doctor. This is important. °Get help right away if: °· You have any signs of stroke. "BE FAST" is an easy way to remember the main warning signs: °? B - Balance. Signs are dizziness, sudden trouble walking, or loss of balance. °? E - Eyes. Signs are trouble seeing or a sudden change in how you see. °? F - Face. Signs are sudden weakness or loss of feeling of the face, or the face or eyelid drooping on one side. °? A - Arms. Signs are weakness or loss of feeling in an  arm. This happens suddenly and usually on one side of the body. °? S - Speech. Signs are sudden trouble speaking, slurred speech, or trouble understanding what people say. °? T - Time. Time to call emergency services. Write down what time symptoms started. °· You have other signs of stroke, such as: °? A sudden, very bad headache with no known cause. °? Feeling sick to your stomach (nausea). °? Throwing up (vomiting). °? Jerky movements that you cannot control (seizure). °These symptoms may be an emergency. Do not wait to see if the symptoms will go away. Get medical help right away. Call your local emergency services (911 in the [place] Do not drive yourself to the hospital. °Summary °· A transient ischemic attack (TIA) is a "warning stroke" that causes stroke-like symptoms that go away quickly. °· A TIA is a medical emergency. Get help right away, even if your symptoms go away. °· A TIA does not cause lasting damage to the brain. °· Having a TIA is a sign that you may be at risk for a stroke. Lifestyle changes and medical treatments can help prevent a stroke. °This information is not intended to replace advice given to you by your health care provider. Make sure you discuss any questions you have with your health care provider. °Document Released: 08/25/2008 Document Revised: 08/12/2018 Document Reviewed: 02/17/2017 °Elsevier Patient Education © 2020 Else[employer] Inc. ° °

## 2019-11-21 NOTE — Progress Notes (Signed)
Carotid duplex complete. Please see CV Proc tab for preliminary results. [place], RVT 3:27 PM  11/21/2019

## 2019-11-21 NOTE — Consult Note (Addendum)
Hospital Consult    Reason for Consult: Left internal carotid artery stenosis Requesting Physician: Hospitalist MRN #:  [id number]  History of Present Illness: This is a 78 y.o. male with past medical history significant for hypertension, hyperlipidemia, type 2 diabetes mellitus, paroxysmal atrial fibrillation on chronic anticoagulation and rate and rhythm controlled, CKD stage III, CAD, and carotid artery stenosis.  Surgical history significant for right carotid endarterectomy in 12/2015 by Dr. Bridgett Larsson.  He is being seen in consultation for evaluation of left internal carotid artery stenosis noted to be about 80% on CTA neck.  He presented to the emergency department with 10 minutes of left lip and left arm numbness and tingling.  Code stroke was activated in the emergency department and he underwent MRI brain which was negative for acute CVA.  He has been under annual surveillance for carotid artery stenosis with carotid duplex.  As of 10/2018 left ICA 40 to 59% stenosis and patient was scheduled to return to office next month.  Patient denies any further symptoms including slurring speech or changes in vision.  Patient also reports that left lip and arm tingling and numbness has completely resolved.  He is taking Eliquis daily for paroxysmal atrial fibrillation.  He is also on a statin and aspirin daily.  He is a former smoker who quit over 40 years ago.  It should also be noted that he is followed for surveillance of AAA and common iliac aneurysms every 2 years with duplex.    Past Medical History:  Diagnosis Date  . AAA (abdominal aortic aneurysm) ([place])   . Arthritis   . Cancer (HCC)    skin & squamous cell  . Carotid artery disease ([place])    a. s/p R CEA 12/2015.  [name] CKD (chronic kidney disease), stage III    a. per historical labs.  . Coronary artery disease    a. s/p CABG 2004.  . Diabetes mellitus without complication ([place])   . GERD (gastroesophageal reflux disease)   . Hyperlipidemia   .  Hypertension   . Medication intolerance Multiple   . PAF (paroxysmal atrial fibrillation) ([place])   . Pneumonia   . Sinus bradycardia    a. baseline HR 50s-60s, also h/o bradycardia on metoprolol and carvedilol    Past Surgical History:  Procedure Laterality Date  . ANKLE SURGERY     right fused  . BACK SURGERY     low back   . CHOLECYSTECTOMY N/A 12/07/2013   Procedure: LAPAROSCOPIC CHOLECYSTECTOMY ;  Surgeon: [name], MD;  Location: [facility];  Service: General;  Laterality: N/A;  . COLONOSCOPY    . CORONARY ARTERY BYPASS GRAFT  2004   [place]  . CORONARY STENT INTERVENTION N/A 05/21/2017   Procedure: Coronary Stent Intervention;  Surgeon: [name], MD;  Location: [place] [facility];  Service: Cardiovascular;  Laterality: N/A;  . ENDARTERECTOMY Right 12/10/2015   Procedure: Right Carotid ENDARTERECTOMY;  Surgeon: Conrad Leland, MD;  Location: [facility];  Service: Vascular;  Laterality: Right;  . EXPLORATION POST OPERATIVE OPEN HEART    . FINGER SURGERY     skin graft on rt index  . FINGER SURGERY Right    right index finger.  [name] HERNIA REPAIR Right    [facility]  . JOINT REPLACEMENT    . KNEE SURGERY     replacement on both knees  . LEFT HEART CATH AND CORS/GRAFTS ANGIOGRAPHY N/A 05/21/2017   Procedure: Left Heart Cath and Cors/Grafts Angiography;  Surgeon: Burt Knack,  Legrand Como, MD;  Location: [facility];  Service: Cardiovascular;  Laterality: N/A;  . PROSTATE SURGERY    . PVC ABLATION N/A 10/27/2017   Procedure: PVC ABLATION;  Surgeon: Evans Lance, MD;  Location: [facility] [facility];  Service: Cardiovascular;  Laterality: N/A;  . TONSILLECTOMY    . URINARY SURGERY     scar tissue    Allergies  Allergen Reactions  . Lisinopril Swelling and Other (See Comments)    Angioedema.   . Losartan Swelling and Other (See Comments)    Lips swell Angioedema   . Nifedipine Swelling and Other (See Comments)    Sugar increase  . Triamterene Swelling  . Hydralazine Hcl  Other (See Comments)    Fatigue; poor appetite  . Amoxicillin Rash and Other (See Comments)    Has patient had a PCN reaction causing immediate rash, facial/tongue/throat swelling, SOB or lightheadedness with hypotension:No Has patient had a PCN reaction causing severe rash involving mucus membranes or skin necrosis:No Has patient had a PCN reaction that required hospitalization:No Has patient had a PCN reaction occurring within the last 10 years: No If all of the above answers are "NO", then may proceed with Cephalosporin use.   . Ampicillin Rash and Other (See Comments)    Has patient had a PCN reaction causing immediate rash, facial/tongue/throat swelling, SOB or lightheadedness with hypotension:No Has patient had a PCN reaction causing severe rash involving mucus membranes or skin necrosis:No Has patient had a PCN reaction that required hospitalization:No Has patient had a PCN reaction occurring within the last 10 years: No If all of the above answers are "NO", then may proceed with Cephalosporin use.    . Carvedilol Other (See Comments)    Low heart rate     Prior to Admission medications   Medication Sig Start Date End Date Taking? Authorizing Provider  amLODipine (NORVASC) 10 MG tablet Take 1 tablet (10 mg total) by mouth daily. 01/19/17  Yes [name], MD  apixaban (ELIQUIS) 2.5 MG TABS tablet Take 1 tablet (2.5 mg total) by mouth 2 (two) times daily. (0800 & 2000) 08/03/19  Yes Almyra Deforest, PA  aspirin EC 81 MG tablet Take 1 tablet (81 mg total) by mouth daily. 07/27/19  Yes Bhagat, Bhavinkumar, PA  atorvastatin (LIPITOR) 40 MG tablet Take 40 mg by mouth at bedtime.    Yes [provider]  cholecalciferol (VITAMIN D) 1000 UNITS tablet Take 1,000 Units by mouth every evening.    Yes [provider]  cloNIDine (CATAPRES) 0.1 MG tablet Take 1 tablet (0.1 mg total) by mouth 2 (two) times daily. 07/20/19  Yes Bhagat, Bhavinkumar, PA  Cyanocobalamin 1000 MCG/ML KIT  Inject 1,000 mcg as directed every 30 (thirty) days.   Yes [provider]  dofetilide (TIKOSYN) 250 MCG capsule Take 1 capsule (250 mcg total) by mouth 2 (two) times daily. 09/30/16  Yes Reino Bellis B, NP  gabapentin (NEURONTIN) 100 MG capsule Take 2 capsules (200 mg total) by mouth at bedtime. Patient taking differently: Take 200 mg by mouth daily as needed.  06/26/19  Yes Lyndal Pulley, DO  magnesium oxide (MAG-OX) 400 MG tablet Take 1 tablet (400 mg total) by mouth 4 (four) times daily. Patient taking differently: Take 800 mg by mouth 2 (two) times daily.  12/03/16  Yes [name], MD  metFORMIN (GLUCOPHAGE) 1000 MG tablet TAKE 1 TABLET BY MOUTH TWICE DAILY WITH A MEAL Patient taking differently: Take 1,000 mg by mouth 2 (two)  times daily with a meal.  10/30/19  Yes [name], MD  nitroGLYCERIN (NITROSTAT) 0.4 MG SL tablet Place 1 tablet (0.4 mg total) under the tongue every 5 (five) minutes as needed for chest pain (x 3 doses). 05/22/16  Yes Bacigalupo, Dionne Bucy, MD  pantoprazole (PROTONIX) 40 MG tablet Take 1 tablet (40 mg total) by mouth daily. 06/08/17 11/20/19 Yes Bhagat, Bhavinkumar, PA  propranolol (INDERAL) 20 MG tablet Take one tablet by mouth PRN for irregular heartbeat.  Maximum 2 tablets in 24 hours. Patient taking differently: Take 20 mg by mouth See admin instructions. Take one tablet by mouth PRN for irregular heartbeat.  Maximum 2 tablets in 24 hours. 03/18/18  Yes Evans Lance, MD  terazosin (HYTRIN) 1 MG capsule Take 1 mg by mouth at bedtime. (2100) 01/07/17  Yes [name], MD  vitamin C (ASCORBIC ACID) 500 MG tablet Take 500 mg by mouth daily.    Yes [provider]  glucose blood test strip Accu-Check Guide Strips. Dx E11.9 Use to test blood glucose level 2 times a day. 08/05/18   [name], MD    Social History   Socioeconomic History  . Marital status: Married    Spouse name: Not on file  . Number of children: 2  . Years of  education: Not on file  . Highest education level: Not on file  Occupational History  . Occupation: retired  Tobacco Use  . Smoking status: Former Smoker    Packs/day: 1.00    Years: 13.00    Pack years: 13.00    Quit date: 11/30/1970    Years since quitting: 49.0  . Smokeless tobacco: Never Used  Substance and Sexual Activity  . Alcohol use: No    Alcohol/week: 0.0 standard drinks  . Drug use: No  . Sexual activity: Yes  Other Topics Concern  . Not on file  Social History Narrative  . Not on file   Social Determinants of Health   Financial Resource Strain:   . Difficulty of Paying Living Expenses: Not on file  Food Insecurity:   . Worried About Charity fundraiser in the Last Year: Not on file  . Ran Out of Food in the Last Year: Not on file  Transportation Needs:   . Lack of Transportation (Medical): Not on file  . Lack of Transportation (Non-Medical): Not on file  Physical Activity: Sufficiently Active  . Days of Exercise per Week: 4 days  . Minutes of Exercise per Session: 40 min  Stress:   . Feeling of Stress : Not on file  Social Connections:   . Frequency of Communication with Friends and Family: Not on file  . Frequency of Social Gatherings with Friends and Family: Not on file  . Attends Religious Services: Not on file  . Active Member of Clubs or Organizations: Not on file  . Attends Archivist Meetings: Not on file  . Marital Status: Not on file  Intimate Partner Violence:   . Fear of Current or Ex-Partner: Not on file  . Emotionally Abused: Not on file  . Physically Abused: Not on file  . Sexually Abused: Not on file     Family History  Problem Relation Age of Onset  . Stroke Mother   . Heart disease Mother   . Heart disease Father   . Hypertension Father   . Hyperlipidemia Father   . Cancer Sister        breast  and skin  .  Diabetes Sister   . Hypertension Sister   . Diabetes Brother   . Heart disease Brother   . Hyperlipidemia  Brother   . Hypertension Brother   . Early death Neg Hx     ROS: Otherwise negative unless mentioned in HPI  Physical Examination  Vitals:   11/21/19 0448 11/21/19 0803  BP: (!) 145/70 (!) 161/60  Pulse: 60 64  Resp: 18   Temp: 98 F (36.7 C)   SpO2: 98%    Body mass index is 26.32 kg/m.  General:  WDWN in NAD Gait: Not observed HENT: WNL, normocephalic Pulmonary: normal non-labored breathing, without Rales, rhonchi,  wheezing Cardiac: regular Abdomen:  soft, NT/ND, no masses Skin: without rashes Vascular Exam/Pulses: symmetrical radial pulses Extremities: without ischemic changes, without Gangrene , without cellulitis; without open wounds;  Musculoskeletal: no muscle wasting or atrophy  Neurologic: A&O X 3;  No focal weakness or paresthesias are detected; speech is fluent/normal Psychiatric:  The pt has Normal affect. Lymph:  Unremarkable  CBC    Component Value Date/Time   WBC 4.6 11/21/2019 0610   RBC 3.45 (L) 11/21/2019 0610   HGB 10.4 (L) 11/21/2019 0610   HGB 11.1 (L) 10/14/2017 1250   HCT 32.7 (L) 11/21/2019 0610   HCT 34.3 (L) 10/14/2017 1250   PLT 120 (L) 11/21/2019 0610   PLT 141 (L) 10/14/2017 1250   MCV 94.8 11/21/2019 0610   MCV 93 10/14/2017 1250   MCH 30.1 11/21/2019 0610   MCHC 31.8 11/21/2019 0610   RDW 13.7 11/21/2019 0610   RDW 14.4 10/14/2017 1250   LYMPHSABS 1.1 11/20/2019 1046   LYMPHSABS 1.3 10/14/2017 1250   MONOABS 0.4 11/20/2019 1046   EOSABS 0.6 (H) 11/20/2019 1046   EOSABS 0.4 10/14/2017 1250   BASOSABS 0.1 11/20/2019 1046   BASOSABS 0.0 10/14/2017 1250    BMET    Component Value Date/Time   NA 139 11/21/2019 0610   NA 142 07/18/2019 0914   K 5.2 (H) 11/21/2019 0610   CL 102 11/21/2019 0610   CO2 27 11/21/2019 0610   GLUCOSE 164 (H) 11/21/2019 0610   BUN 21 11/21/2019 0610   BUN 28 (H) 07/18/2019 0914   CREATININE 1.57 (H) 11/21/2019 0610   CREATININE 1.45 (H) 05/22/2016 0938   CALCIUM 9.0 11/21/2019 0610    GFRNONAA 41 (L) 11/21/2019 0610   GFRNONAA 46 (L) 05/22/2016 0938   GFRAA 48 (L) 11/21/2019 0610   GFRAA 54 (L) 05/22/2016 0938    COAGS: Lab Results  Component Value Date   INR 1.2 11/20/2019   INR 1.19 09/04/2017   INR 1.0 05/17/2017     Non-Invasive Vascular Imaging:   CTA estimates 80% left proximal ICA stenosis Surgical site of right ICA without any hemodynamically significant stenosis  Carotid duplex pending   ASSESSMENT/PLAN: This is a 78 y.o. male with transient L lip and arm numbness and tingling; L ICA stenosis  -MRI negative for acute CVA; neurological status at baseline with complete symptom resolution -Right ICA surgical site without any hemodynamically significant stenosis -Left ICA estimated to be about 80% on CTA neck; carotid duplex pending -Given left-sided symptoms, these are likely unrelated to left ICA lesion however given degree of stenosis we will further evaluate with duplex to further inform possible left ICA revascularization -On-call vascular surgeon Dr. Scot Dock will evaluate the patient later today and provide further treatment plans; patient is aware that given the increase in Covid hospitalizations, he will likely have to return as an  outpatient if we proceed with elective revascularization   Dagoberto Ligas PA-C [facility] [phone]  I have interviewed the patient and examined the patient. I agree with the findings by the PA.  His carotid duplex scan shows no evidence of recurrent carotid stenosis on the right.  On the left side he has a 60 to 79% carotid stenosis which is asymptomatic.  He was scheduled to see Dr. Carlis Abbott in January but we will move that follow-up visit out to 6 months and I will arrange a duplex at that time.  He can be discharged from my standpoint.  Gae Gallop, MD [phone]

## 2019-11-21 NOTE — Care Management Obs Status (Signed)
[facility] NOTIFICATION   Patient Details  Name: Thomas Marshall MRN: [id number] Date of Birth: 1941-03-11   Medicare Observation Status Notification Given:  Yes    Marilu Favre, RN 11/21/2019, 1:56 PM

## 2019-11-21 NOTE — Progress Notes (Signed)
OT Cancellation Note  Patient Details Name: Thomas Marshall MRN: [id number] DOB: October 27, 1941   Cancelled Treatment:    Reason Eval/Treat Not Completed: OT screened, no needs identified, will sign off. Observed patient ambulating independently in his room, utilized bathroom and performed hand hygiene independently. Spoke with patient to address any concerns, patient reports feeling back to his baseline, no questions/concerns at this time.   [place] OT office: [place] 11/21/2019, 9:43 AM

## 2019-11-21 NOTE — Discharge Summary (Signed)
Physician Discharge Summary  KAIMANA LURZ EML:[id number] DOB: 1941-10-13 DOA: 11/20/2019  PCP: [name], MD  Admit date: 11/20/2019 Discharge date: 11/21/2019  Admitted From: Home Disposition: Home  Recommendations for Outpatient Follow-up:  1. Follow up with PCP in 1-2 weeks 2. Follow with vascular surgery 3. Please obtain BMP/CBC in one week 4. Please follow up on the following pending results:  Home Health: None Equipment/Devices: None  Discharge Condition: Stable CODE STATUS: Full code Diet recommendation: Cardiac  Subjective: Seen and examined.  No complaints.  Numbness and tingling of the left arm resolved.  HPI: Thomas Marshall is a 78 y.o. male with medical history significant of hypertension, hyperlipidemia, PAF on chronic anticoagulation, AAA, carotid artery disease s/p right CEA in 2017, CAD, CKD stage III: And squamous cell skin cancer.  Patient reports symptoms started after he was getting ready to go out this morning.  Reported having numbness and tingling sensation on the left side of his lips and on the left arm.  Denies any complaints of headache, change in vision, weakness, palpitations, chest pain, shortness of breath, nausea, vomiting, diarrhea, fever, or chills.  Upon further questioning it appears that hehas been intermittently having numbness and tingling on the left side of his lip and possibly of the left arm over the last few days.      ED Course: On admission into the emergency department patient was noted and code stroke.  CT scan of the brain revealed new ganglia and external capsule region hypodensity, but thought to be likely chronic lacune.  Labs significant for WBC 4.5, hemoglobin 10.5, platelets 120, BUN 26, and creatinine 1.5.  Urinalysis negative for any acute abnormalities.  Urine drug screen was negative.  CTA of the head and neck revealed 80% stenosis of the proximal left internal carotid artery.  Neurology recommended checking MRI of the brain and  EEG although low suspicion for seizure.   Brief/Interim Summary: During this brief hospitalization, he underwent MRI of the brainWhich did not show any evidence of acute infarction.  CT angiogram of the head and neck which showed> 80% stenosis of the left ICA.  After talking to patient, he tells me that he had history of right internal carotid endarterectomy in 2017 and that his left ICA had about 50% stenosis based on the examination done last year in December.  He has establish relationship with vascular surgeon Dr. Bridgett Larsson already.  Vascular surgery was consulted.  We obtained Doppler carotid which showed less than 80% of stenosis.  He was cleared to go home with follow-up with them.  Neurology also cleared him with no new recs.  He did not have any focal deficit at this point in time.  He was seen by PT.  No further PT was recommended.  Discharge Diagnoses:  Principal Problem:   TIA (transient ischemic attack) Active Problems:   Type 2 diabetes mellitus with renal manifestations (HCC)   GERD (gastroesophageal reflux disease)   Hyperlipidemia   S/P CABG x 4 2004   CKD (chronic kidney disease) stage 3, GFR 30-59 ml/min (HCC)   Benign prostatic hyperplasia   Paroxysmal atrial fibrillation with RVR (HCC)   Left sided numbness   Pancytopenia [facility])    Discharge Instructions  Discharge Instructions    Discharge patient   Complete by: As directed    Discharge when cleared by vascular surgery.   Discharge disposition: 01-Home or Self Care   Discharge patient date: 11/21/2019     Allergies as of 11/21/2019  Reactions   Lisinopril Swelling, Other (See Comments)   Angioedema.    Losartan Swelling, Other (See Comments)   Lips swell Angioedema   Nifedipine Swelling, Other (See Comments)   Sugar increase   Triamterene Swelling   Hydralazine Hcl Other (See Comments)   Fatigue; poor appetite   Amoxicillin Rash, Other (See Comments)   Has patient had a PCN reaction causing immediate  rash, facial/tongue/throat swelling, SOB or lightheadedness with hypotension:No Has patient had a PCN reaction causing severe rash involving mucus membranes or skin necrosis:No Has patient had a PCN reaction that required hospitalization:No Has patient had a PCN reaction occurring within the last 10 years: No If all of the above answers are "NO", then may proceed with Cephalosporin use.   Ampicillin Rash, Other (See Comments)   Has patient had a PCN reaction causing immediate rash, facial/tongue/throat swelling, SOB or lightheadedness with hypotension:No Has patient had a PCN reaction causing severe rash involving mucus membranes or skin necrosis:No Has patient had a PCN reaction that required hospitalization:No Has patient had a PCN reaction occurring within the last 10 years: No If all of the above answers are "NO", then may proceed with Cephalosporin use.   Carvedilol Other (See Comments)   Low heart rate      Medication List    TAKE these medications   amLODipine 10 MG tablet Commonly known as: NORVASC Take 1 tablet (10 mg total) by mouth daily.   apixaban 2.5 MG Tabs tablet Commonly known as: Eliquis Take 1 tablet (2.5 mg total) by mouth 2 (two) times daily. (0800 & 2000)   aspirin EC 81 MG tablet Take 1 tablet (81 mg total) by mouth daily.   atorvastatin 40 MG tablet Commonly known as: LIPITOR Take 40 mg by mouth at bedtime.   cholecalciferol 25 MCG (1000 UT) tablet Commonly known as: VITAMIN D Take 1,000 Units by mouth every evening.   cloNIDine 0.1 MG tablet Commonly known as: CATAPRES Take 1 tablet (0.1 mg total) by mouth 2 (two) times daily.   Cyanocobalamin 1000 MCG/ML Kit Inject 1,000 mcg as directed every 30 (thirty) days.   dofetilide 250 MCG capsule Commonly known as: TIKOSYN Take 1 capsule (250 mcg total) by mouth 2 (two) times daily.   gabapentin 100 MG capsule Commonly known as: NEURONTIN Take 2 capsules (200 mg total) by mouth at bedtime. What  changed:   when to take this  reasons to take this   glucose blood test strip Accu-Check Guide Strips. Dx E11.9 Use to test blood glucose level 2 times a day.   magnesium oxide 400 MG tablet Commonly known as: MAG-OX Take 1 tablet (400 mg total) by mouth 4 (four) times daily. What changed:   how much to take  when to take this   metFORMIN 1000 MG tablet Commonly known as: GLUCOPHAGE TAKE 1 TABLET BY MOUTH TWICE DAILY WITH A MEAL What changed: See the new instructions.   nitroGLYCERIN 0.4 MG SL tablet Commonly known as: NITROSTAT Place 1 tablet (0.4 mg total) under the tongue every 5 (five) minutes as needed for chest pain (x 3 doses).   pantoprazole 40 MG tablet Commonly known as: Protonix Take 1 tablet (40 mg total) by mouth daily.   propranolol 20 MG tablet Commonly known as: INDERAL Take one tablet by mouth PRN for irregular heartbeat.  Maximum 2 tablets in 24 hours. What changed:   how much to take  how to take this  when to take this   terazosin  1 MG capsule Commonly known as: HYTRIN Take 1 mg by mouth at bedtime. (2100)   vitamin C 500 MG tablet Commonly known as: ASCORBIC ACID Take 500 mg by mouth daily.      Follow-up Information    [name], MD Follow up in 1 week(s).   Specialties: Internal Medicine, Radiology Contact information: [address] [place] [address] [phone]          Allergies  Allergen Reactions  . Lisinopril Swelling and Other (See Comments)    Angioedema.   . Losartan Swelling and Other (See Comments)    Lips swell Angioedema   . Nifedipine Swelling and Other (See Comments)    Sugar increase  . Triamterene Swelling  . Hydralazine Hcl Other (See Comments)    Fatigue; poor appetite  . Amoxicillin Rash and Other (See Comments)    Has patient had a PCN reaction causing immediate rash, facial/tongue/throat swelling, SOB or lightheadedness with hypotension:No Has patient had a PCN reaction causing  severe rash involving mucus membranes or skin necrosis:No Has patient had a PCN reaction that required hospitalization:No Has patient had a PCN reaction occurring within the last 10 years: No If all of the above answers are "NO", then may proceed with Cephalosporin use.   . Ampicillin Rash and Other (See Comments)    Has patient had a PCN reaction causing immediate rash, facial/tongue/throat swelling, SOB or lightheadedness with hypotension:No Has patient had a PCN reaction causing severe rash involving mucus membranes or skin necrosis:No Has patient had a PCN reaction that required hospitalization:No Has patient had a PCN reaction occurring within the last 10 years: No If all of the above answers are "NO", then may proceed with Cephalosporin use.    . Carvedilol Other (See Comments)    Low heart rate     Consultations: Neurology and vascular surgery   Procedures/Studies: CT Code Stroke CTA Head W/WO contrast  Result Date: 11/20/2019 CLINICAL DATA:  Focal neuro deficit.  Left-sided numbness. EXAM: CT ANGIOGRAPHY HEAD AND NECK TECHNIQUE: Multidetector CT imaging of the head and neck was performed using the standard protocol during bolus administration of intravenous contrast. Multiplanar CT image reconstructions and MIPs were obtained to evaluate the vascular anatomy. Carotid stenosis measurements (when applicable) are obtained utilizing NASCET criteria, using the distal internal carotid diameter as the denominator. CONTRAST:  65m OMNIPAQUE IOHEXOL 350 MG/ML SOLN COMPARISON:  CT head 11/20/2019 FINDINGS: CTA NECK FINDINGS Aortic arch: Atherosclerotic calcification aortic arch without aneurysm or dissection. Atherosclerotic calcification in the proximal great vessels without stenosis. Right carotid system: Mild atherosclerotic disease right carotid bifurcation without stenosis Left carotid system: Calcified and noncalcified plaque at the left carotid bifurcation causing severe stenosis of the  internal carotid artery. Estimated 80% diameter stenosis proximal left internal carotid artery. Vertebral arteries: Both vertebral arteries patent to the basilar without significant stenosis. Skeleton: Cervical spondylosis. No acute skeletal abnormality. Median sternotomy. Other neck: Negative for mass or adenopathy Upper chest: Visualized lung apices clear bilaterally. Review of the MIP images confirms the above findings CTA HEAD FINDINGS Anterior circulation: Atherosclerotic calcification in the cavernous carotid bilaterally. No significant stenosis. Anterior and middle cerebral arteries patent bilaterally. Mild atherosclerotic irregularity in the anterior middle cerebral artery branches bilaterally without flow limiting stenosis. No vascular malformation. Posterior circulation: Both vertebral arteries patent to the basilar. Left vertebral artery is patent. Small left PICA patent. Right PICA not visualized. AICA patent bilaterally. Basilar widely patent. Superior cerebellar and posterior cerebral arteries patent  bilaterally. Fetal origin left posterior cerebral artery. Venous sinuses: Normal venous enhancement Anatomic variants: None Review of the MIP images confirms the above findings IMPRESSION: 1. 80% diameter stenosis proximal left internal carotid artery due to atherosclerotic plaque. No significant right carotid stenosis. No significant vertebral artery stenosis in the neck 2. Negative for intracranial large vessel occlusion. Mild diffuse intracranial atherosclerotic disease without flow limiting stenosis. Electronically Signed   By: Franchot Gallo M.D.   On: 11/20/2019 11:29   CT Code Stroke CTA Neck W/WO contrast  Result Date: 11/20/2019 CLINICAL DATA:  Focal neuro deficit.  Left-sided numbness. EXAM: CT ANGIOGRAPHY HEAD AND NECK TECHNIQUE: Multidetector CT imaging of the head and neck was performed using the standard protocol during bolus administration of intravenous contrast. Multiplanar CT image  reconstructions and MIPs were obtained to evaluate the vascular anatomy. Carotid stenosis measurements (when applicable) are obtained utilizing NASCET criteria, using the distal internal carotid diameter as the denominator. CONTRAST:  62m OMNIPAQUE IOHEXOL 350 MG/ML SOLN COMPARISON:  CT head 11/20/2019 FINDINGS: CTA NECK FINDINGS Aortic arch: Atherosclerotic calcification aortic arch without aneurysm or dissection. Atherosclerotic calcification in the proximal great vessels without stenosis. Right carotid system: Mild atherosclerotic disease right carotid bifurcation without stenosis Left carotid system: Calcified and noncalcified plaque at the left carotid bifurcation causing severe stenosis of the internal carotid artery. Estimated 80% diameter stenosis proximal left internal carotid artery. Vertebral arteries: Both vertebral arteries patent to the basilar without significant stenosis. Skeleton: Cervical spondylosis. No acute skeletal abnormality. Median sternotomy. Other neck: Negative for mass or adenopathy Upper chest: Visualized lung apices clear bilaterally. Review of the MIP images confirms the above findings CTA HEAD FINDINGS Anterior circulation: Atherosclerotic calcification in the cavernous carotid bilaterally. No significant stenosis. Anterior and middle cerebral arteries patent bilaterally. Mild atherosclerotic irregularity in the anterior middle cerebral artery branches bilaterally without flow limiting stenosis. No vascular malformation. Posterior circulation: Both vertebral arteries patent to the basilar. Left vertebral artery is patent. Small left PICA patent. Right PICA not visualized. AICA patent bilaterally. Basilar widely patent. Superior cerebellar and posterior cerebral arteries patent bilaterally. Fetal origin left posterior cerebral artery. Venous sinuses: Normal venous enhancement Anatomic variants: None Review of the MIP images confirms the above findings IMPRESSION: 1. 80% diameter  stenosis proximal left internal carotid artery due to atherosclerotic plaque. No significant right carotid stenosis. No significant vertebral artery stenosis in the neck 2. Negative for intracranial large vessel occlusion. Mild diffuse intracranial atherosclerotic disease without flow limiting stenosis. Electronically Signed   By: CFranchot GalloM.D.   On: 11/20/2019 11:29   MR BRAIN WO CONTRAST  Result Date: 11/20/2019 CLINICAL DATA:  TIA EXAM: MRI HEAD WITHOUT CONTRAST TECHNIQUE: Multiplanar, multiecho pulse sequences of the brain and surrounding structures were obtained without intravenous contrast. COMPARISON:  None. FINDINGS: Brain: T2 FLAIR sequence is motion degraded. There is no acute infarction or intracranial hemorrhage. There is no intracranial mass, mass effect, or edema. There is no hydrocephalus or extra-axial fluid collection. Patchy T2 hyperintensity in the supratentorial white matter is nonspecific but likely reflects mild chronic microvascular ischemic changes. There are chronic small vessel infarcts of the centrum semiovale and right basal ganglia and cerebellum. Vascular: Major vessel flow voids at the skull base are preserved. Skull and upper cervical spine: Normal marrow signal is preserved. Sinuses/Orbits: Mild mucosal thickening.  Orbits are unremarkable. Other: Sella is unremarkable.  Mastoid air cells are clear. IMPRESSION: No evidence of acute infarction, intracranial hemorrhage, or mass. Mild chronic microvascular ischemic changes and  chronic small vessel infarcts. Electronically Signed   By: Macy Mis M.D.   On: 11/20/2019 18:33   EEG adult  Result Date: 11/20/2019 Lora Havens, MD     11/20/2019  4:39 PM Patient Name: Thomas Marshall MRN: [id number] Epilepsy Attending: Lora Havens Referring Physician/Provider: Dr Fuller Plan Date: 11/20/2019 Duration: 25.63mns Patient history: 78year old male with stuttering left-sided numbness. EEG to evaluate for seizure Level  of alertness: awake, asleep AEDs during EEG study: None Technical aspects: This EEG study was done with scalp electrodes positioned according to the 10-20 International system of electrode placement. Electrical activity was acquired at a sampling rate of '500Hz'$  and reviewed with a high frequency filter of '70Hz'$  and a low frequency filter of '1Hz'$ . EEG data were recorded continuously and digitally stored. DESCRIPTION:  The posterior dominant rhythm consists of 8-9 Hz activity of moderate voltage (25-35 uV) seen predominantly in posterior head regions, symmetric and reactive to eye opening and eye closing. Sleep was characterized by vertex waves, sleep spindles (12-'14Hz'$ ), maximal frontocentral. Physiologic photic driving was seen during photic stimulation. Hyperventilation was not performed. IMPRESSION: This study is within normal limits. No seizures or epileptiform discharges were seen throughout the recording. PLora Havens  CT HEAD CODE STROKE WO CONTRAST  Result Date: 11/20/2019 CLINICAL DATA:  Code stroke. 78year old male with left side face and arm numbness. EXAM: CT HEAD WITHOUT CONTRAST TECHNIQUE: Contiguous axial images were obtained from the base of the skull through the vertex without intravenous contrast. COMPARISON:  Head CT 03/04/2016. FINDINGS: Brain: Cerebral volume remains normal for age. No ventriculomegaly. No midline shift, mass effect, or evidence of intracranial mass lesion. No acute intracranial hemorrhage identified. Right basal ganglia and external capsule region hypodensity is new since 2017 but has a chronic appearance (sagittal image 24). Mild for age scattered white matter hypodensity elsewhere has not significantly changed. No cortically based acute infarct identified. Vascular: Calcified atherosclerosis at the skull base. No suspicious intracranial vascular hyperdensity. Skull: No acute osseous abnormality identified. Sinuses/Orbits: Hyperplastic and clear. Other: Mild chronic soft  tissue scarring at the posterior vertex. No acute orbit or scalp soft tissue findings. ASPECTS ([facility]) Total score (0-10 with 10 being normal): 10 IMPRESSION: 1. New since 2017 but favor chronic lacune in the right lentiform/external capsule. 2. No superimposed acute cortically based infarct or acute intracranial hemorrhage identified. 3.  ASPECTS 10. 4. These results were communicated to Dr. KLeonel Ramsayat 11:05 am on 11/20/2019 by text page via the [facility]messaging system. Electronically Signed   By: HGenevie AnnM.D.   On: 11/20/2019 11:06   VAS [place]  Result Date: 11/21/2019 Carotid Arterial Duplex Study Indications: TIA and right endarterectomy. Performing Technologist: CAntonieta PertRDMS, RVT  Examination Guidelines: A complete evaluation includes B-mode imaging, spectral Doppler, color Doppler, and power Doppler as needed of all accessible portions of each vessel. Bilateral testing is considered an integral part of a complete examination. Limited examinations for reoccurring indications may be performed as noted.  Right Carotid Findings: +----------+--------+--------+--------+------------------+--------+           PSV cm/sEDV cm/sStenosisPlaque DescriptionComments +----------+--------+--------+--------+------------------+--------+ CCA Prox  106     14                                         +----------+--------+--------+--------+------------------+--------+ CCA Mid  calcific                   +----------+--------+--------+--------+------------------+--------+ CCA Distal103     12                                         +----------+--------+--------+--------+------------------+--------+ ICA Prox  131     31              heterogenous               +----------+--------+--------+--------+------------------+--------+ ICA Mid   111     21                                          +----------+--------+--------+--------+------------------+--------+ ICA Distal132     26                                         +----------+--------+--------+--------+------------------+--------+ ECA       103     9                                          +----------+--------+--------+--------+------------------+--------+ +----------+--------+-------+--------+-------------------+           PSV cm/sEDV cmsDescribeArm Pressure (mmHG) +----------+--------+-------+--------+-------------------+ Subclavian150                                        +----------+--------+-------+--------+-------------------+ +---------+--------+--+--------+--+---------+ VertebralPSV cm/s65EDV cm/s12Antegrade +---------+--------+--+--------+--+---------+  Left Carotid Findings: +----------+--------+--------+--------+---------------------+------------------+           PSV cm/sEDV cm/sStenosisPlaque Description   Comments           +----------+--------+--------+--------+---------------------+------------------+ CCA Prox  138     17                                   intimal thickening +----------+--------+--------+--------+---------------------+------------------+ CCA Distal84      20              heterogenous, diffuse                                                     and calcific                            +----------+--------+--------+--------+---------------------+------------------+ ICA Prox  276     53              heterogenous, diffuse                                                     and calcific                            +----------+--------+--------+--------+---------------------+------------------+  ICA Mid   281     60      60-79%                                          +----------+--------+--------+--------+---------------------+------------------+ ICA Distal194     36                                                       +----------+--------+--------+--------+---------------------+------------------+ ECA       299     16      >50%                                            +----------+--------+--------+--------+---------------------+------------------+ +----------+--------+--------+--------+-------------------+           PSV cm/sEDV cm/sDescribeArm Pressure (mmHG) +----------+--------+--------+--------+-------------------+ [id number]                                         +----------+--------+--------+--------+-------------------+ +---------+--------+--+--------+--+---------+ VertebralPSV cm/s56EDV cm/s11Antegrade +---------+--------+--+--------+--+---------+  Summary: Right Carotid: Velocities in the right ICA are consistent with a 1-39% stenosis. Left Carotid: Velocities in the left ICA are consistent with a low end 60-79%               stenosis. The ECA appears >50% stenosed. Vertebrals: Bilateral vertebral arteries demonstrate antegrade flow. *See table(s) above for measurements and observations.     Preliminary      Discharge Exam: Vitals:   11/21/19 0803 11/21/19 1241  BP: (!) 161/60 (!) 149/74  Pulse: 64 (!) 56  Resp:  17  Temp:    SpO2:  97%   Vitals:   11/20/19 2349 11/21/19 0448 11/21/19 0803 11/21/19 1241  BP: 139/68 (!) 145/70 (!) 161/60 (!) 149/74  Pulse: 60 60 64 (!) 56  Resp: '18 18  17  '$ Temp: 98.1 F (36.7 C) 98 F (36.7 C)    TempSrc: Oral Oral    SpO2: 97% 98%  97%  Weight:      Height:        General: Pt is alert, awake, not in acute distress Cardiovascular: RRR, S1/S2 +, no rubs, no gallops Respiratory: CTA bilaterally, no wheezing, no rhonchi Abdominal: Soft, NT, ND, bowel sounds + Extremities: no edema, no cyanosis    The results of significant diagnostics from this hospitalization (including imaging, microbiology, ancillary and laboratory) are listed below for reference.     Microbiology: Recent Results (from the past 240 hour(s))  SARS  CORONAVIRUS 2 (TAT 6-24 HRS) Nasopharyngeal Nasopharyngeal Swab     Status: None   Collection Time: 11/20/19 11:28 AM   Specimen: Nasopharyngeal Swab  Result Value Ref Range Status   SARS Coronavirus 2 NEGATIVE NEGATIVE Final    Comment: (NOTE) SARS-CoV-2 target nucleic acids are NOT DETECTED. The SARS-CoV-2 RNA is generally detectable in upper and lower respiratory specimens during the acute phase of infection. Negative results do not preclude SARS-CoV-2 infection, do not rule out co-infections with other pathogens, and should not be used as the sole basis for treatment or other patient management decisions.  Negative results must be combined with clinical observations, patient history, and epidemiological information. The expected result is Negative. Fact Sheet for Patients: SugarRoll.be Fact Sheet for Healthcare Providers: https://www.woods-mathews.com/ This test is not yet approved or cleared by the [place] FDA and  has been authorized for detection and/or diagnosis of SARS-CoV-2 by FDA under an Emergency Use Authorization (EUA). This EUA will remain  in effect (meaning this test can be used) for the duration of the COVID-19 declaration under Section 56 4(b)(1) of the Act, 21 U.S.C. section 360bbb-3(b)(1), unless the authorization is terminated or revoked sooner. Performed at [facility], [place] [address], [place], [place] [address]      Labs: BNP (last 3 results) No results for input(s): BNP in the last 8760 hours. Basic Metabolic Panel: Recent Labs  Lab 11/20/19 1046 11/20/19 1059 11/20/19 1504 11/21/19 0610  NA 140 141  --  139  K 4.3 4.4  --  5.2*  CL 106 104  --  102  CO2 26  --   --  27  GLUCOSE 195* 187*  --  164*  BUN 26* 26*  --  21  CREATININE 1.64* 1.50*  --  1.57*  CALCIUM 8.8*  --   --  9.0  MG  --   --  1.9  --    Liver Function Tests: Recent Labs  Lab 11/20/19 1046  AST 21  ALT 18  ALKPHOS 53   BILITOT 0.6  PROT 6.1*  ALBUMIN 3.7   No results for input(s): LIPASE, AMYLASE in the last 168 hours. No results for input(s): AMMONIA in the last 168 hours. CBC: Recent Labs  Lab 11/20/19 1046 11/20/19 1059 11/21/19 0610  WBC 4.5  --  4.6  NEUTROABS 2.4  --   --   HGB 10.5* 10.5* 10.4*  HCT 33.6* 31.0* 32.7*  MCV 95.5  --  94.8  PLT 120*  --  120*   Cardiac Enzymes: No results for input(s): CKTOTAL, CKMB, CKMBINDEX, TROPONINI in the last 168 hours. BNP: Invalid input(s): POCBNP CBG: Recent Labs  Lab 11/20/19 1636 11/20/19 2134 11/21/19 0609 11/21/19 1103  GLUCAP 103* 137* 159* 98   D-Dimer No results for input(s): DDIMER in the last 72 hours. Hgb A1c Recent Labs    11/21/19 0610  HGBA1C 6.5*   Lipid Profile Recent Labs    11/21/19 0610  CHOL 114  HDL 38*  LDLCALC 62  TRIG 69  CHOLHDL 3.0   Thyroid function studies No results for input(s): TSH, T4TOTAL, T3FREE, THYROIDAB in the last 72 hours.  Invalid input(s): FREET3 Anemia work up No results for input(s): VITAMINB12, FOLATE, FERRITIN, TIBC, IRON, RETICCTPCT in the last 72 hours. Urinalysis    Component Value Date/Time   COLORURINE YELLOW 11/20/2019 1158   APPEARANCEUR CLEAR 11/20/2019 1158   LABSPEC 1.025 11/20/2019 1158   PHURINE 7.0 11/20/2019 1158   GLUCOSEU NEGATIVE 11/20/2019 1158   GLUCOSEU NEGATIVE 10/12/2018 1628   HGBUR NEGATIVE 11/20/2019 1158   BILIRUBINUR NEGATIVE 11/20/2019 1158   KETONESUR NEGATIVE 11/20/2019 1158   PROTEINUR NEGATIVE 11/20/2019 1158   UROBILINOGEN 0.2 10/12/2018 1628   NITRITE NEGATIVE 11/20/2019 1158   LEUKOCYTESUR NEGATIVE 11/20/2019 1158   Sepsis Labs Invalid input(s): PROCALCITONIN,  WBC,  LACTICIDVEN Microbiology Recent Results (from the past 240 hour(s))  SARS CORONAVIRUS 2 (TAT 6-24 HRS) Nasopharyngeal Nasopharyngeal Swab     Status: None   Collection Time: 11/20/19 11:28 AM   Specimen: Nasopharyngeal Swab  Result Value Ref Range  Status   SARS  Coronavirus 2 NEGATIVE NEGATIVE Final    Comment: (NOTE) SARS-CoV-2 target nucleic acids are NOT DETECTED. The SARS-CoV-2 RNA is generally detectable in upper and lower respiratory specimens during the acute phase of infection. Negative results do not preclude SARS-CoV-2 infection, do not rule out co-infections with other pathogens, and should not be used as the sole basis for treatment or other patient management decisions. Negative results must be combined with clinical observations, patient history, and epidemiological information. The expected result is Negative. Fact Sheet for Patients: SugarRoll.be Fact Sheet for Healthcare Providers: https://www.woods-mathews.com/ This test is not yet approved or cleared by the [place] FDA and  has been authorized for detection and/or diagnosis of SARS-CoV-2 by FDA under an Emergency Use Authorization (EUA). This EUA will remain  in effect (meaning this test can be used) for the duration of the COVID-19 declaration under Section 56 4(b)(1) of the Act, 21 U.S.C. section 360bbb-3(b)(1), unless the authorization is terminated or revoked sooner. Performed at [facility], [place] [address], [place], [place] [address]      Time coordinating discharge: Over 30 minutes  SIGNED:   Darliss Cheney, MD  [facility] 11/21/2019, 4:02 PM  If 7PM-7AM, please contact night-coverage [email or web address] Password [email or web address]

## 2019-11-21 NOTE — Progress Notes (Signed)
Pt discharge education went over at bedside with pt and pt wife Pt IV removed, catheter intact and telemetry removed Pt has all belongings including stroke education booklet  Pt discharged via wheelchair with NT

## 2019-11-22 ENCOUNTER — Telehealth: Payer: Self-pay | Admitting: *Deleted

## 2019-11-22 NOTE — Telephone Encounter (Signed)
Called pt to [facility] f/uy appt that's been made for 12/29/*20. Pt is aware of the appt. Completed TCM questionnaire below.Johny Chess  Transition Care Management Follow-up Telephone Call   Date discharged?11/21/19   How have you been since you were released from the hospital? Pt states he is doing ok   Do you understand why you were in the hospital? YES   Do you understand the discharge instructions? YES   Where were you discharged to? Home   Items Reviewed:  Medications reviewed: YES, he states there was no changes on his medication   Allergies reviewed: YES  Dietary changes reviewed: YES, heart healthy  Referrals reviewed: No referral recommeded   Functional Questionnaire:   Activities of Daily Living (ADLs):   He states he are independent in the following: ambulation, bathing and hygiene, feeding, continence, grooming, toileting and dressing States he doesn't require assistance    Any transportation issues/concerns?: NO   Any patient concerns? NO   Confirmed importance and date/time of follow-up visits scheduled YES, appt 11/28/19  Provider Appointment booked with Dr. Jenny Reichmann  Confirmed with patient if condition begins to worsen call PCP or go to the ER.  Patient was given the office number and encouraged to call back with question or concerns.  : YES

## 2019-11-28 ENCOUNTER — Encounter (HOSPITAL_COMMUNITY): Payer: Medicare Other

## 2019-11-28 ENCOUNTER — Other Ambulatory Visit: Payer: Self-pay

## 2019-11-28 ENCOUNTER — Ambulatory Visit (INDEPENDENT_AMBULATORY_CARE_PROVIDER_SITE_OTHER): Payer: Medicare Other | Admitting: Internal Medicine

## 2019-11-28 ENCOUNTER — Ambulatory Visit: Payer: Medicare Other | Admitting: Vascular Surgery

## 2019-11-28 ENCOUNTER — Encounter: Payer: Self-pay | Admitting: Internal Medicine

## 2019-11-28 DIAGNOSIS — H1131 Conjunctival hemorrhage, right eye: Secondary | ICD-10-CM

## 2019-11-28 DIAGNOSIS — I10 Essential (primary) hypertension: Secondary | ICD-10-CM | POA: Diagnosis not present

## 2019-11-28 DIAGNOSIS — E1121 Type 2 diabetes mellitus with diabetic nephropathy: Secondary | ICD-10-CM | POA: Diagnosis not present

## 2019-11-28 DIAGNOSIS — E1122 Type 2 diabetes mellitus with diabetic chronic kidney disease: Secondary | ICD-10-CM

## 2019-11-28 DIAGNOSIS — N1832 Chronic kidney disease, stage 3b: Secondary | ICD-10-CM

## 2019-11-28 DIAGNOSIS — G459 Transient cerebral ischemic attack, unspecified: Secondary | ICD-10-CM | POA: Diagnosis not present

## 2019-11-28 NOTE — Progress Notes (Signed)
Subjective:    Patient ID: Thomas Marshall, male    DOB: Jun 18, 1941, 78 y.o.   MRN: [id number]  HPI  Here to f/u recent hospn dec 21-22 with TIA, old lacunar infarct seen on MRI, and left carotid was > 80%, seen per Dr Doren Custard vasc surgury as inpt, originally had appt dec 12 but asked to schedule a bit later since just seen, and has current f/u sched for about 4 mo. Today states having some numbness intermittent left face less than twice per day for minutes only, no further arm pain, and no ncek pain. Has some left shoulder pain and may need another cortisone shot, but just puts up with it.  Mild intermittent, dull, and right shoulder hurts occasionally as well, just not bad enough at this time per pt.  Has seen renal and stable CKD recent.  No overt bleeding.  Overall med good compliance.  Last renal stone about 6 yrs ago.  Today also with acute onset right subconjunctial hemorrhage x 3 days, plans to be seen as walkin at the [place] [facility].  On eliquis and asa.  Past Medical History:  Diagnosis Date  . AAA (abdominal aortic aneurysm) ([place])   . Arthritis   . Cancer (HCC)    skin & squamous cell  . Carotid artery disease ([place])    a. s/p R CEA 12/2015.  [name] CKD (chronic kidney disease), stage III    a. per historical labs.  . Coronary artery disease    a. s/p CABG 2004.  . Diabetes mellitus without complication ([place])   . GERD (gastroesophageal reflux disease)   . Hyperlipidemia   . Hypertension   . Medication intolerance Multiple   . PAF (paroxysmal atrial fibrillation) ([place])   . Pneumonia   . Sinus bradycardia    a. baseline HR 50s-60s, also h/o bradycardia on metoprolol and carvedilol   Past Surgical History:  Procedure Laterality Date  . ANKLE SURGERY     right fused  . BACK SURGERY     low back   . CHOLECYSTECTOMY N/A 12/07/2013   Procedure: LAPAROSCOPIC CHOLECYSTECTOMY ;  Surgeon: [name], MD;  Location: [facility];  Service: General;  Laterality: N/A;  . COLONOSCOPY    . CORONARY  ARTERY BYPASS GRAFT  2004   [place]  . CORONARY STENT INTERVENTION N/A 05/21/2017   Procedure: Coronary Stent Intervention;  Surgeon: [name], MD;  Location: [facility];  Service: Cardiovascular;  Laterality: N/A;  . ENDARTERECTOMY Right 12/10/2015   Procedure: Right Carotid ENDARTERECTOMY;  Surgeon: Conrad Kalama, MD;  Location: [facility];  Service: Vascular;  Laterality: Right;  . EXPLORATION POST OPERATIVE OPEN HEART    . FINGER SURGERY     skin graft on rt index  . FINGER SURGERY Right    right index finger.  [name] HERNIA REPAIR Right    RIH  . JOINT REPLACEMENT    . KNEE SURGERY     replacement on both knees  . LEFT HEART CATH AND CORS/GRAFTS ANGIOGRAPHY N/A 05/21/2017   Procedure: Left Heart Cath and Cors/Grafts Angiography;  Surgeon: [name], MD;  Location: [facility];  Service: Cardiovascular;  Laterality: N/A;  . PROSTATE SURGERY    . PVC ABLATION N/A 10/27/2017   Procedure: PVC ABLATION;  Surgeon: Evans Lance, MD;  Location: [facility];  Service: Cardiovascular;  Laterality: N/A;  . TONSILLECTOMY    . URINARY SURGERY     scar tissue  reports that he quit smoking about 49 years ago. He has a 13.00 pack-year smoking history. He has never used smokeless tobacco. He reports that he does not drink alcohol or use drugs. family history includes Cancer in his sister; Diabetes in his brother and sister; Heart disease in his brother, father, and mother; Hyperlipidemia in his brother and father; Hypertension in his brother, father, and sister; Stroke in his mother. Allergies  Allergen Reactions  . Lisinopril Swelling and Other (See Comments)    Angioedema.   . Losartan Swelling and Other (See Comments)    Lips swell Angioedema   . Nifedipine Swelling and Other (See Comments)    Sugar increase  . Triamterene Swelling  . Hydralazine Hcl Other (See Comments)    Fatigue; poor appetite  . Amoxicillin Rash and Other (See Comments)    Has patient  had a PCN reaction causing immediate rash, facial/tongue/throat swelling, SOB or lightheadedness with hypotension:No Has patient had a PCN reaction causing severe rash involving mucus membranes or skin necrosis:No Has patient had a PCN reaction that required hospitalization:No Has patient had a PCN reaction occurring within the last 10 years: No If all of the above answers are "NO", then may proceed with Cephalosporin use.   . Ampicillin Rash and Other (See Comments)    Has patient had a PCN reaction causing immediate rash, facial/tongue/throat swelling, SOB or lightheadedness with hypotension:No Has patient had a PCN reaction causing severe rash involving mucus membranes or skin necrosis:No Has patient had a PCN reaction that required hospitalization:No Has patient had a PCN reaction occurring within the last 10 years: No If all of the above answers are "NO", then may proceed with Cephalosporin use.    . Carvedilol Other (See Comments)    Low heart rate    Current Outpatient Medications on File Prior to Visit  Medication Sig Dispense Refill  . amLODipine (NORVASC) 10 MG tablet Take 1 tablet (10 mg total) by mouth daily. 30 tablet 11  . apixaban (ELIQUIS) 2.5 MG TABS tablet Take 1 tablet (2.5 mg total) by mouth 2 (two) times daily. (0800 & 2000) 180 tablet 3  . aspirin EC 81 MG tablet Take 1 tablet (81 mg total) by mouth daily. 90 tablet 3  . atorvastatin (LIPITOR) 40 MG tablet Take 40 mg by mouth at bedtime.     . cholecalciferol (VITAMIN D) 1000 UNITS tablet Take 1,000 Units by mouth every evening.     . cloNIDine (CATAPRES) 0.1 MG tablet Take 1 tablet (0.1 mg total) by mouth 2 (two) times daily. 180 tablet 3  . Cyanocobalamin 1000 MCG/ML KIT Inject 1,000 mcg as directed every 30 (thirty) days.    . dofetilide (TIKOSYN) 250 MCG capsule Take 1 capsule (250 mcg total) by mouth 2 (two) times daily. 60 capsule 6  . gabapentin (NEURONTIN) 100 MG capsule Take 2 capsules (200 mg total) by  mouth at bedtime. (Patient taking differently: Take 200 mg by mouth daily as needed. ) 60 capsule 3  . glucose blood test strip Accu-Check Guide Strips. Dx E11.9 Use to test blood glucose level 2 times a day. 50 each 11  . magnesium oxide (MAG-OX) 400 MG tablet Take 1 tablet (400 mg total) by mouth 4 (four) times daily. (Patient taking differently: Take 800 mg by mouth 2 (two) times daily. ) 360 tablet 3  . metFORMIN (GLUCOPHAGE) 1000 MG tablet TAKE 1 TABLET BY MOUTH TWICE DAILY WITH A MEAL (Patient taking differently: Take 1,000 mg by mouth 2 (  two) times daily with a meal. ) 180 tablet 0  . nitroGLYCERIN (NITROSTAT) 0.4 MG SL tablet Place 1 tablet (0.4 mg total) under the tongue every 5 (five) minutes as needed for chest pain (x 3 doses). 30 tablet 3  . propranolol (INDERAL) 20 MG tablet Take one tablet by mouth PRN for irregular heartbeat.  Maximum 2 tablets in 24 hours. (Patient taking differently: Take 20 mg by mouth See admin instructions. Take one tablet by mouth PRN for irregular heartbeat.  Maximum 2 tablets in 24 hours.) 30 tablet 3  . terazosin (HYTRIN) 1 MG capsule Take 1 mg by mouth at bedtime. (2100) 30 capsule 11  . vitamin C (ASCORBIC ACID) 500 MG tablet Take 500 mg by mouth daily.     . pantoprazole (PROTONIX) 40 MG tablet Take 1 tablet (40 mg total) by mouth daily. 90 tablet 3   No current facility-administered medications on file prior to visit.   Review of Systems  Constitutional: Negative for other unusual diaphoresis or sweats HENT: Negative for ear discharge or swelling Eyes: Negative for other worsening visual disturbances Respiratory: Negative for stridor or other swelling  Gastrointestinal: Negative for worsening distension or other blood Genitourinary: Negative for retention or other urinary change Musculoskeletal: Negative for other MSK pain or swelling Skin: Negative for color change or other new lesions Neurological: Negative for worsening tremors and other numbness   Psychiatric/Behavioral: Negative for worsening agitation or other fatigue All otherwise neg per pt     Objective:   Physical Exam BP (!) 162/64   Pulse 68   Temp 97.8 F (36.6 C)   Resp 12   Ht 6' 2" (1.88 m)   Wt 211 lb (95.7 kg)   SpO2 98%   BMI 27.09 kg/m  VS noted,  Constitutional: Pt appears in NAD HENT: Head: NCAT.  Right Ear: External ear normal.  Left Ear: External ear normal.  Eyes: . Pupils are equal, round, and reactive to light. Conjunctivae and EOM are normal, but has right subconjunctival bleeding with active pooling small amount under left lower lid BRB Nose: without d/c or deformity Neck: Neck supple. Gross normal ROM Ca.allj rdiovascular: Normal rate and regular rhythm.   Pulmonary/Chest: Effort normal and breath sounds without rales or wheezing.  Abd:  Soft, NT, ND, + BS, no organomegaly Neurological: Pt is alert. At baseline orientation, motor grossly intact Skin: Skin is warm. No rashes, other new lesions, no LE edema Psychiatric: Pt behavior is normal without agitation  All otherwise neg per pt  Lab Results  Component Value Date   WBC 4.6 11/21/2019   HGB 10.4 (L) 11/21/2019   HCT 32.7 (L) 11/21/2019   PLT 120 (L) 11/21/2019   GLUCOSE 164 (H) 11/21/2019   CHOL 114 11/21/2019   TRIG 69 11/21/2019   HDL 38 (L) 11/21/2019   LDLDIRECT 67.0 09/16/2016   LDLCALC 62 11/21/2019   ALT 18 11/20/2019   AST 21 11/20/2019   NA 139 11/21/2019   K 5.2 (H) 11/21/2019   CL 102 11/21/2019   CREATININE 1.57 (H) 11/21/2019   BUN 21 11/21/2019   CO2 27 11/21/2019   TSH 2.27 10/16/2019   PSA 1.09 03/31/2018   INR 1.2 11/20/2019   HGBA1C 6.5 (H) 11/21/2019   MICROALBUR 5.1 (H) 03/31/2018       Assessment & Plan:

## 2019-11-28 NOTE — Patient Instructions (Signed)
Please be sure to follow up with the [facility] doctor as you have planned today, but be aware you may need to hold on the eliquis and/or Aspirin if the bleeding cannot be stopped  Please continue all other medications as before, and refills have been done if requested.  Please have the pharmacy call with any other refills you may need.  Please continue your efforts at being more active, low cholesterol diet, and weight control.  Please keep your appointments with your specialists as you may have planned - Dr Doren Custard

## 2019-12-01 ENCOUNTER — Encounter: Payer: Self-pay | Admitting: Internal Medicine

## 2019-12-01 DIAGNOSIS — H1130 Conjunctival hemorrhage, unspecified eye: Secondary | ICD-10-CM | POA: Insufficient documentation

## 2019-12-01 NOTE — Assessment & Plan Note (Signed)
stable overall by history and exam, recent data reviewed with pt, and pt to continue medical treatment as before,  to f/u any worsening symptoms or concerns  

## 2019-12-01 NOTE — Assessment & Plan Note (Signed)
Mod to severe with active pooling small new BRB lower lid, for optho f/u later today, to continue eliquis and asa for now

## 2019-12-12 ENCOUNTER — Encounter (HOSPITAL_COMMUNITY): Payer: Medicare Other

## 2019-12-12 ENCOUNTER — Ambulatory Visit: Payer: Medicare Other

## 2019-12-18 ENCOUNTER — Ambulatory Visit: Payer: Medicare Other

## 2019-12-19 DIAGNOSIS — C44329 Squamous cell carcinoma of skin of other parts of face: Secondary | ICD-10-CM | POA: Diagnosis not present

## 2019-12-20 ENCOUNTER — Other Ambulatory Visit: Payer: Self-pay

## 2019-12-20 ENCOUNTER — Encounter: Payer: Self-pay | Admitting: Family Medicine

## 2019-12-20 ENCOUNTER — Ambulatory Visit: Payer: Medicare Other | Attending: Internal Medicine

## 2019-12-20 ENCOUNTER — Ambulatory Visit (INDEPENDENT_AMBULATORY_CARE_PROVIDER_SITE_OTHER): Payer: Medicare Other | Admitting: Family Medicine

## 2019-12-20 VITALS — BP 122/70 | HR 58 | Ht 74.0 in | Wt 214.0 lb

## 2019-12-20 DIAGNOSIS — E538 Deficiency of other specified B group vitamins: Secondary | ICD-10-CM

## 2019-12-20 DIAGNOSIS — M7551 Bursitis of right shoulder: Secondary | ICD-10-CM | POA: Diagnosis not present

## 2019-12-20 DIAGNOSIS — Z23 Encounter for immunization: Secondary | ICD-10-CM | POA: Insufficient documentation

## 2019-12-20 MED ORDER — CYANOCOBALAMIN 1000 MCG/ML IJ SOLN
1000.0000 ug | Freq: Once | INTRAMUSCULAR | Status: AC
  Administered 2019-12-20: 1000 ug via INTRAMUSCULAR

## 2019-12-20 NOTE — Patient Instructions (Signed)
Injected shoulder See me again in 3 months

## 2019-12-20 NOTE — Progress Notes (Signed)
Thomas Marshall Phone: [phone] Subjective:    I'm seeing this patient by the request  of:  [name], MD  CC: Right shoulder pain  CBS:[id number]   8/20/ Thomas Marshall is a 79 y.o. male coming in with complaint of right shoulder pain.    Update 12/20/2019 Patient is here today for right shoulder pain. Patient states he had been having chronic right shoulder pain. Has had injection in shoulder 30 years ago which did help. Pain over superior aspect of shoulder. Pain increases with extension and IR.  Does not remember any true injury.  Patient states that it started to give some difficulty with even daily activities and has some discomfort at night.     Past Medical History:  Diagnosis Date  . AAA (abdominal aortic aneurysm) ([place])   . Arthritis   . Cancer (HCC)    skin & squamous cell  . Carotid artery disease ([place])    a. s/p R CEA 12/2015.  [name] CKD (chronic kidney disease), stage III    a. per historical labs.  . Coronary artery disease    a. s/p CABG 2004.  . Diabetes mellitus without complication ([place])   . GERD (gastroesophageal reflux disease)   . Hyperlipidemia   . Hypertension   . Medication intolerance Multiple   . PAF (paroxysmal atrial fibrillation) ([place])   . Pneumonia   . Sinus bradycardia    a. baseline HR 50s-60s, also h/o bradycardia on metoprolol and carvedilol   Past Surgical History:  Procedure Laterality Date  . ANKLE SURGERY     right fused  . BACK SURGERY     low back   . CHOLECYSTECTOMY N/A 12/07/2013   Procedure: LAPAROSCOPIC CHOLECYSTECTOMY ;  Surgeon: [name], MD;  Location: [facility];  Service: General;  Laterality: N/A;  . COLONOSCOPY    . CORONARY ARTERY BYPASS GRAFT  2004   [place]  . CORONARY STENT INTERVENTION N/A 05/21/2017   Procedure: Coronary Stent Intervention;  Surgeon: [name], MD;  Location: [facility];  Service: Cardiovascular;   Laterality: N/A;  . ENDARTERECTOMY Right 12/10/2015   Procedure: Right Carotid ENDARTERECTOMY;  Surgeon: Conrad Timber Hills, MD;  Location: [facility];  Service: Vascular;  Laterality: Right;  . EXPLORATION POST OPERATIVE OPEN HEART    . FINGER SURGERY     skin graft on rt index  . FINGER SURGERY Right    right index finger.  [name] HERNIA REPAIR Right    [facility]  . JOINT REPLACEMENT    . KNEE SURGERY     replacement on both knees  . LEFT HEART CATH AND CORS/GRAFTS ANGIOGRAPHY N/A 05/21/2017   Procedure: Left Heart Cath and Cors/Grafts Angiography;  Surgeon: [name], MD;  Location: [facility];  Service: Cardiovascular;  Laterality: N/A;  . PROSTATE SURGERY    . PVC ABLATION N/A 10/27/2017   Procedure: PVC ABLATION;  Surgeon: Evans Lance, MD;  Location: [facility];  Service: Cardiovascular;  Laterality: N/A;  . TONSILLECTOMY    . URINARY SURGERY     scar tissue   Social History   Socioeconomic History  . Marital status: Married    Spouse name: Not on file  . Number of children: 2  . Years of education: Not on file  . Highest education level: Not on file  Occupational History  . Occupation: retired  Tobacco Use  . Smoking status: Former  Smoker    Packs/day: 1.00    Years: 13.00    Pack years: 13.00    Quit date: 11/30/1970    Years since quitting: 49.0  . Smokeless tobacco: Never Used  Substance and Sexual Activity  . Alcohol use: No    Alcohol/week: 0.0 standard drinks  . Drug use: No  . Sexual activity: Yes  Other Topics Concern  . Not on file  Social History Narrative  . Not on file   Social Determinants of Health   Financial Resource Strain:   . Difficulty of Paying Living Expenses: Not on file  Food Insecurity:   . Worried About Charity fundraiser in the Last Year: Not on file  . Ran Out of Food in the Last Year: Not on file  Transportation Needs:   . Lack of Transportation (Medical): Not on file  . Lack of Transportation (Non-Medical): Not on file    Physical Activity: Sufficiently Active  . Days of Exercise per Week: 4 days  . Minutes of Exercise per Session: 40 min  Stress:   . Feeling of Stress : Not on file  Social Connections:   . Frequency of Communication with Friends and Family: Not on file  . Frequency of Social Gatherings with Friends and Family: Not on file  . Attends Religious Services: Not on file  . Active Member of Clubs or Organizations: Not on file  . Attends Archivist Meetings: Not on file  . Marital Status: Not on file   Allergies  Allergen Reactions  . Lisinopril Swelling and Other (See Comments)    Angioedema.   . Losartan Swelling and Other (See Comments)    Lips swell Angioedema   . Nifedipine Swelling and Other (See Comments)    Sugar increase  . Triamterene Swelling  . Hydralazine Hcl Other (See Comments)    Fatigue; poor appetite  . Amoxicillin Rash and Other (See Comments)    Has patient had a PCN reaction causing immediate rash, facial/tongue/throat swelling, SOB or lightheadedness with hypotension:No Has patient had a PCN reaction causing severe rash involving mucus membranes or skin necrosis:No Has patient had a PCN reaction that required hospitalization:No Has patient had a PCN reaction occurring within the last 10 years: No If all of the above answers are "NO", then may proceed with Cephalosporin use.   . Ampicillin Rash and Other (See Comments)    Has patient had a PCN reaction causing immediate rash, facial/tongue/throat swelling, SOB or lightheadedness with hypotension:No Has patient had a PCN reaction causing severe rash involving mucus membranes or skin necrosis:No Has patient had a PCN reaction that required hospitalization:No Has patient had a PCN reaction occurring within the last 10 years: No If all of the above answers are "NO", then may proceed with Cephalosporin use.    . Carvedilol Other (See Comments)    Low heart rate    Family History  Problem Relation Age  of Onset  . Stroke Mother   . Heart disease Mother   . Heart disease Father   . Hypertension Father   . Hyperlipidemia Father   . Cancer Sister        breast  and skin  . Diabetes Sister   . Hypertension Sister   . Diabetes Brother   . Heart disease Brother   . Hyperlipidemia Brother   . Hypertension Brother   . Early death Neg Hx     Current Outpatient Medications (Endocrine & Metabolic):  .  metFORMIN (GLUCOPHAGE)  1000 MG tablet, TAKE 1 TABLET BY MOUTH TWICE DAILY WITH A MEAL (Patient taking differently: Take 1,000 mg by mouth 2 (two) times daily with a meal. )  Current Outpatient Medications (Cardiovascular):  .  amLODipine (NORVASC) 10 MG tablet, Take 1 tablet (10 mg total) by mouth daily. [name]  atorvastatin (LIPITOR) 40 MG tablet, Take 40 mg by mouth at bedtime.  .  cloNIDine (CATAPRES) 0.1 MG tablet, Take 1 tablet (0.1 mg total) by mouth 2 (two) times daily. [name]  dofetilide (TIKOSYN) 250 MCG capsule, Take 1 capsule (250 mcg total) by mouth 2 (two) times daily. .  nitroGLYCERIN (NITROSTAT) 0.4 MG SL tablet, Place 1 tablet (0.4 mg total) under the tongue every 5 (five) minutes as needed for chest pain (x 3 doses). .  propranolol (INDERAL) 20 MG tablet, Take one tablet by mouth PRN for irregular heartbeat.  Maximum 2 tablets in 24 hours. (Patient taking differently: Take 20 mg by mouth See admin instructions. Take one tablet by mouth PRN for irregular heartbeat.  Maximum 2 tablets in 24 hours.) .  terazosin (HYTRIN) 1 MG capsule, Take 1 mg by mouth at bedtime. (2100)   Current Outpatient Medications (Analgesics):  .  aspirin EC 81 MG tablet, Take 1 tablet (81 mg total) by mouth daily.  Current Outpatient Medications (Hematological):  .  apixaban (ELIQUIS) 2.5 MG TABS tablet, Take 1 tablet (2.5 mg total) by mouth 2 (two) times daily. (0800 & 2000) .  Cyanocobalamin 1000 MCG/ML KIT, Inject 1,000 mcg as directed every 30 (thirty) days.  Current Outpatient Medications (Other):  .   cholecalciferol (VITAMIN D) 1000 UNITS tablet, Take 1,000 Units by mouth every evening.  .  gabapentin (NEURONTIN) 100 MG capsule, Take 2 capsules (200 mg total) by mouth at bedtime. (Patient taking differently: Take 200 mg by mouth daily as needed. ) .  glucose blood test strip, Accu-Check Guide Strips. Dx E11.9 Use to test blood glucose level 2 times a day. .  magnesium oxide (MAG-OX) 400 MG tablet, Take 1 tablet (400 mg total) by mouth 4 (four) times daily. (Patient taking differently: Take 800 mg by mouth 2 (two) times daily. ) .  pantoprazole (PROTONIX) 40 MG tablet, Take 1 tablet (40 mg total) by mouth daily. .  vitamin C (ASCORBIC ACID) 500 MG tablet, Take 500 mg by mouth daily.     Past medical history, social, surgical and family history all reviewed in electronic medical record.  No pertanent information unless stated regarding to the chief complaint.   Review of Systems:  No headache, visual changes, nausea, vomiting, diarrhea, constipation, dizziness, abdominal pain, skin rash, fevers, chills, night sweats, weight loss, swollen lymph nodes, body aches, joint swelling, chest pain, shortness of breath, mood changes. POSITIVE muscle aches  Objective  Blood pressure 122/70, pulse (!) 58, height _0  (1.88 m), weight 214 lb (97.1 kg), SpO2 98 %.   General: No apparent distress alert and oriented x3 mood and affect normal, dressed appropriately.  HEENT: Pupils equal, extraocular movements intact  Respiratory: Patient's speak in full sentences and does not appear short of breath  Cardiovascular: No lower extremity edema, non tender, no erythema  Skin: Warm dry intact with no signs of infection or rash on extremities or on axial skeleton.  Abdomen: Soft nontender  Neuro: Cranial nerves II through XII are intact, neurovascularly intact in all extremities with 2+ DTRs and 2+ pulses.  Lymph: No lymphadenopathy of posterior or anterior cervical chain or axillae bilaterally.  Gait normal  with good balance and coordination.  MSK: Right shoulder exam does have positive impingement noted.  Patient is rotator cuff 4+ out of 5.  Patient does have some mild lack of 5 degrees of external rotation.  Contralateral shoulder unremarkable.  After informed written and verbal consent, patient was seated on exam table. Right shoulder was prepped with alcohol swab and utilizing posterior approach, patient's right glenohumeral space was injected with 4:1  marcaine 0.5%: Kenalog 23m/dL. Patient tolerated the procedure well without immediate complications.    Impression and Recommendations:     This case required medical decision making of moderate complexity. The above documentation has been reviewed and is accurate and complete ZLyndal Pulley DO       Note: This dictation was prepared with Dragon dictation along with smaller phrase technology. Any transcriptional errors that result from this process are unintentional.

## 2019-12-20 NOTE — Progress Notes (Signed)
   Covid-19 Vaccination Clinic  Name:  Thomas Marshall    MRN: [id number] DOB: 1941-08-11  12/20/2019  [name] was observed post Covid-19 immunization for 15 minutes without incidence. He was provided with Vaccine Information Sheet and instruction to access the V-Safe system.   [name] was instructed to call 911 with any severe reactions post vaccine: [name] Difficulty breathing  . Swelling of your face and throat  . A fast heartbeat  . A bad rash all over your body  . Dizziness and weakness    Immunizations Administered    Name Date Dose VIS Date Route   Pfizer COVID-19 Vaccine 12/20/2019  5:45 PM 0.3 mL 11/10/2019 Intramuscular   Manufacturer: [place]   Lot: [id number]   [place]: [id number]

## 2019-12-20 NOTE — Assessment & Plan Note (Signed)
Patient given injection and tolerated the procedure well.  Discussed icing regimen and home exercises.  Discussed topical anti-inflammatories.  Avoid oral anti-inflammatories secondary to blood thinner.  Will avoid formal physical therapy secondary to pandemic.  Follow-up again in 3 months

## 2020-01-02 ENCOUNTER — Telehealth: Payer: Self-pay

## 2020-01-02 ENCOUNTER — Emergency Department (HOSPITAL_COMMUNITY): Payer: Medicare Other

## 2020-01-02 ENCOUNTER — Other Ambulatory Visit: Payer: Self-pay

## 2020-01-02 ENCOUNTER — Emergency Department (HOSPITAL_COMMUNITY)
Admission: EM | Admit: 2020-01-02 | Discharge: 2020-01-02 | Disposition: A | Discharge status: Home / Self Care | Payer: Medicare Other | Attending: Emergency Medicine | Admitting: Emergency Medicine

## 2020-01-02 ENCOUNTER — Encounter (HOSPITAL_COMMUNITY): Payer: Self-pay | Admitting: Emergency Medicine

## 2020-01-02 DIAGNOSIS — Z955 Presence of coronary angioplasty implant and graft: Secondary | ICD-10-CM | POA: Insufficient documentation

## 2020-01-02 DIAGNOSIS — Z87891 Personal history of nicotine dependence: Secondary | ICD-10-CM | POA: Insufficient documentation

## 2020-01-02 DIAGNOSIS — R531 Weakness: Secondary | ICD-10-CM | POA: Diagnosis not present

## 2020-01-02 DIAGNOSIS — Z96653 Presence of artificial knee joint, bilateral: Secondary | ICD-10-CM | POA: Diagnosis not present

## 2020-01-02 DIAGNOSIS — Z79899 Other long term (current) drug therapy: Secondary | ICD-10-CM | POA: Diagnosis not present

## 2020-01-02 DIAGNOSIS — I2510 Atherosclerotic heart disease of native coronary artery without angina pectoris: Secondary | ICD-10-CM | POA: Diagnosis not present

## 2020-01-02 DIAGNOSIS — C44329 Squamous cell carcinoma of skin of other parts of face: Secondary | ICD-10-CM | POA: Diagnosis not present

## 2020-01-02 DIAGNOSIS — Z7901 Long term (current) use of anticoagulants: Secondary | ICD-10-CM | POA: Diagnosis not present

## 2020-01-02 DIAGNOSIS — R262 Difficulty in walking, not elsewhere classified: Secondary | ICD-10-CM | POA: Diagnosis not present

## 2020-01-02 DIAGNOSIS — R200 Anesthesia of skin: Secondary | ICD-10-CM | POA: Diagnosis not present

## 2020-01-02 DIAGNOSIS — Z85828 Personal history of other malignant neoplasm of skin: Secondary | ICD-10-CM | POA: Diagnosis not present

## 2020-01-02 DIAGNOSIS — I10 Essential (primary) hypertension: Secondary | ICD-10-CM | POA: Diagnosis not present

## 2020-01-02 DIAGNOSIS — Z7982 Long term (current) use of aspirin: Secondary | ICD-10-CM | POA: Diagnosis not present

## 2020-01-02 DIAGNOSIS — I129 Hypertensive chronic kidney disease with stage 1 through stage 4 chronic kidney disease, or unspecified chronic kidney disease: Secondary | ICD-10-CM | POA: Insufficient documentation

## 2020-01-02 DIAGNOSIS — Z7984 Long term (current) use of oral hypoglycemic drugs: Secondary | ICD-10-CM | POA: Diagnosis not present

## 2020-01-02 DIAGNOSIS — N183 Chronic kidney disease, stage 3 unspecified: Secondary | ICD-10-CM | POA: Diagnosis not present

## 2020-01-02 DIAGNOSIS — R29818 Other symptoms and signs involving the nervous system: Secondary | ICD-10-CM | POA: Diagnosis not present

## 2020-01-02 DIAGNOSIS — M542 Cervicalgia: Secondary | ICD-10-CM | POA: Diagnosis not present

## 2020-01-02 DIAGNOSIS — E1122 Type 2 diabetes mellitus with diabetic chronic kidney disease: Secondary | ICD-10-CM | POA: Diagnosis not present

## 2020-01-02 DIAGNOSIS — Z951 Presence of aortocoronary bypass graft: Secondary | ICD-10-CM | POA: Diagnosis not present

## 2020-01-02 LAB — CBC
HCT: 34.8 % — ABNORMAL LOW (ref 39.0–52.0)
Hemoglobin: 10.7 g/dL — ABNORMAL LOW (ref 13.0–17.0)
MCH: 29 pg (ref 26.0–34.0)
MCHC: 30.7 g/dL (ref 30.0–36.0)
MCV: 94.3 fL (ref 80.0–100.0)
Platelets: 156 10*3/uL (ref 150–400)
RBC: 3.69 MIL/uL — ABNORMAL LOW (ref 4.22–5.81)
RDW: 14.2 % (ref 11.5–15.5)
WBC: 5.1 10*3/uL (ref 4.0–10.5)
nRBC: 0 % (ref 0.0–0.2)

## 2020-01-02 LAB — APTT: aPTT: 31 seconds (ref 24–36)

## 2020-01-02 LAB — DIFFERENTIAL
Abs Immature Granulocytes: 0.02 10*3/uL (ref 0.00–0.07)
Basophils Absolute: 0 10*3/uL (ref 0.0–0.1)
Basophils Relative: 1 %
Eosinophils Absolute: 0.3 10*3/uL (ref 0.0–0.5)
Eosinophils Relative: 6 %
Immature Granulocytes: 0 %
Lymphocytes Relative: 23 %
Lymphs Abs: 1.1 10*3/uL (ref 0.7–4.0)
Monocytes Absolute: 0.5 10*3/uL (ref 0.1–1.0)
Monocytes Relative: 9 %
Neutro Abs: 3.1 10*3/uL (ref 1.7–7.7)
Neutrophils Relative %: 61 %

## 2020-01-02 LAB — COMPREHENSIVE METABOLIC PANEL
ALT: 14 U/L (ref 0–44)
AST: 18 U/L (ref 15–41)
Albumin: 4 g/dL (ref 3.5–5.0)
Alkaline Phosphatase: 54 U/L (ref 38–126)
Anion gap: 11 (ref 5–15)
BUN: 32 mg/dL — ABNORMAL HIGH (ref 8–23)
CO2: 24 mmol/L (ref 22–32)
Calcium: 8.8 mg/dL — ABNORMAL LOW (ref 8.9–10.3)
Chloride: 102 mmol/L (ref 98–111)
Creatinine, Ser: 1.73 mg/dL — ABNORMAL HIGH (ref 0.61–1.24)
GFR calc Af Amer: 42 mL/min — ABNORMAL LOW (ref >60)
GFR calc non Af Amer: 36 mL/min — ABNORMAL LOW (ref >60)
Glucose, Bld: 165 mg/dL — ABNORMAL HIGH (ref 70–99)
Potassium: 4.5 mmol/L (ref 3.5–5.1)
Sodium: 137 mmol/L (ref 135–145)
Total Bilirubin: 0.7 mg/dL (ref 0.3–1.2)
Total Protein: 6.4 g/dL — ABNORMAL LOW (ref 6.5–8.1)

## 2020-01-02 LAB — PROTIME-INR
INR: 1.1 (ref 0.8–1.2)
Prothrombin Time: 13.9 seconds (ref 11.4–15.2)

## 2020-01-02 IMAGING — CT CT HEAD W/O CM
4 series · 16 of 47 positions shown, 18 images · non-contrast
Comparison: Head CT dated [DATE].

CLINICAL DATA: 78-year-old male with focal neurologic deficit and
left-sided weakness.

EXAM:
CT HEAD WITHOUT CONTRAST
TECHNIQUE: Contiguous axial images were obtained from the base of the skull
through the vertex without intravenous contrast.

[Series 3: head without · axial · non-contrast · 0.46mm/px · z∈[-85,+50]mm · 7 of 37 slices shown, 9 images]
[im 5/37  brain]
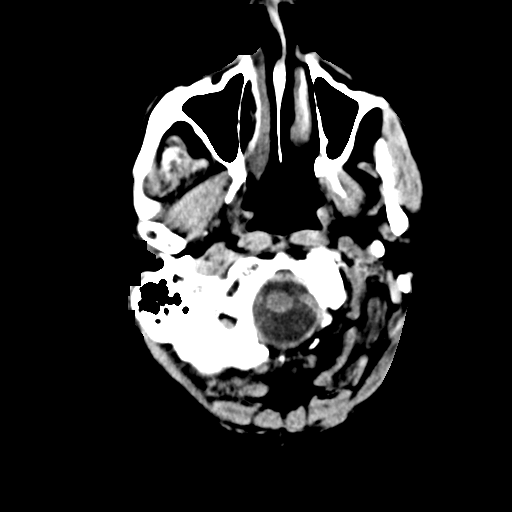
[im 5/37  bone]
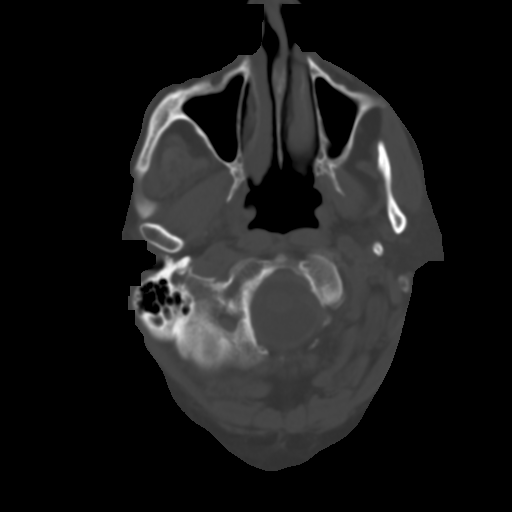
[im 10/37  brain]
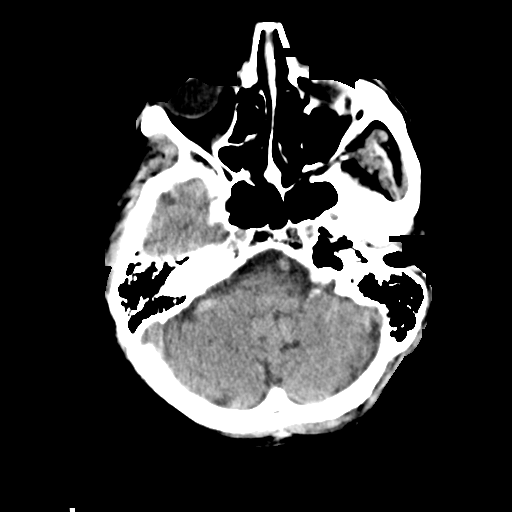
[im 14/37  brain]
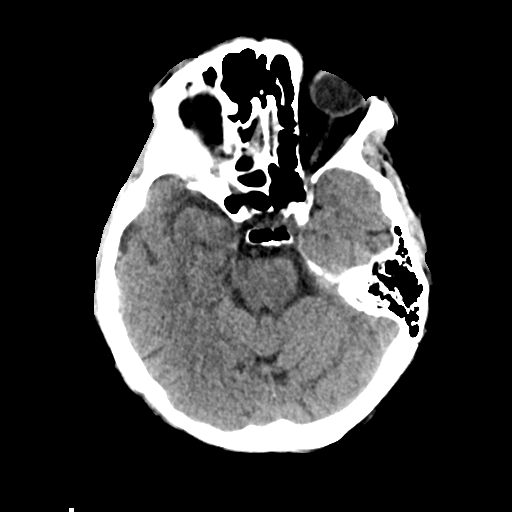
[im 19/37  brain]
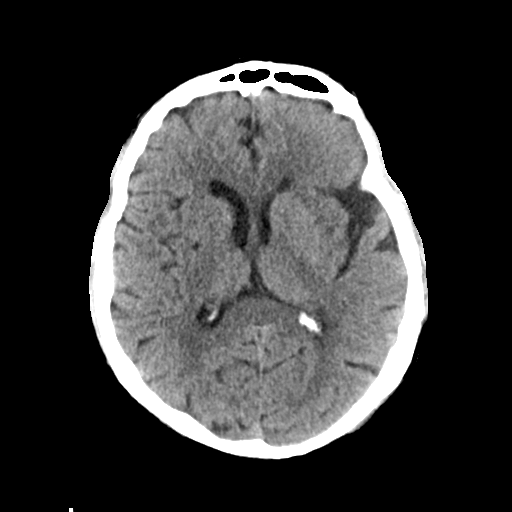
[im 23/37  brain]
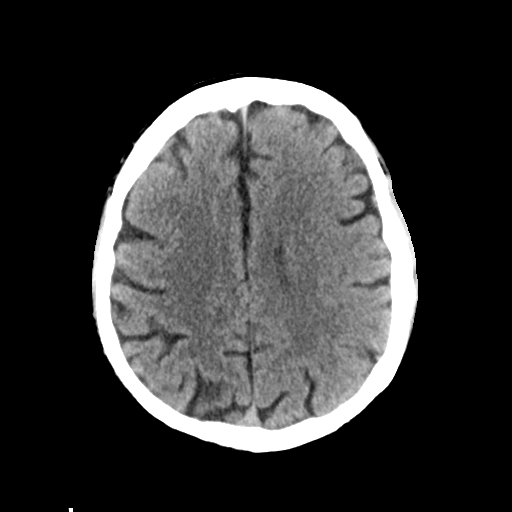
[im 23/37  bone]
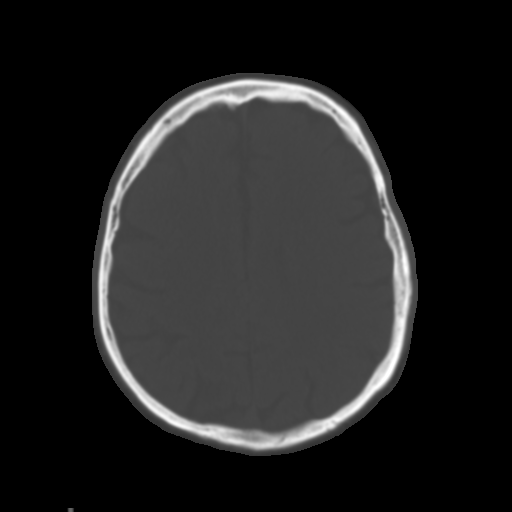
[im 28/37  brain]
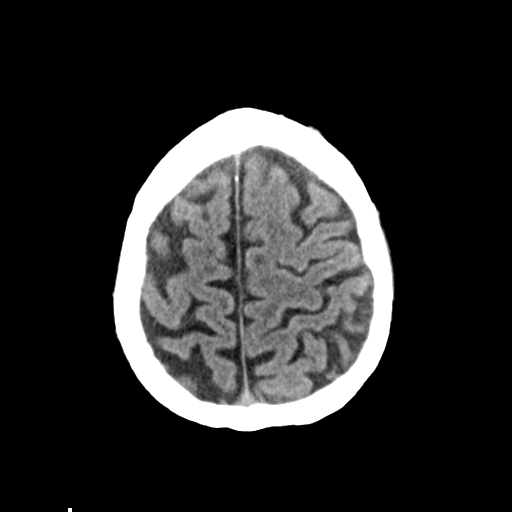
[im 32/37  brain]
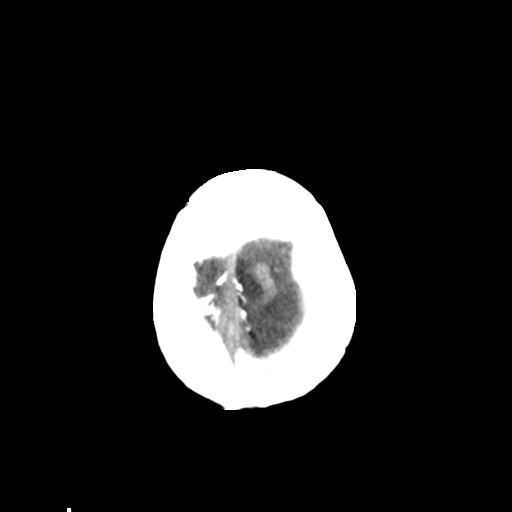

[Series 4: head bone · axial · 0.46mm/px · z∈[-87,-51]mm · 3 of 92 slices shown]
[im 10/92  bone]
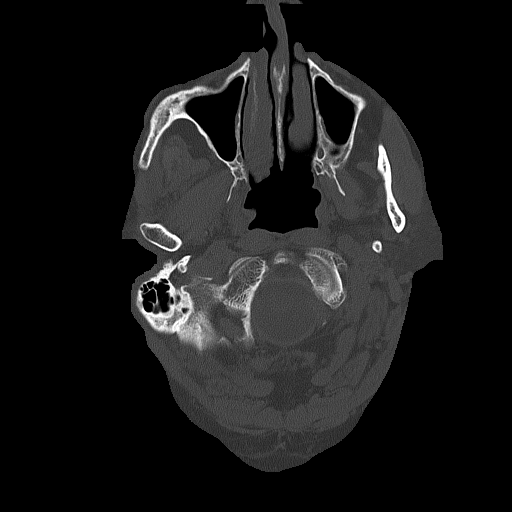
[im 19/92  bone]
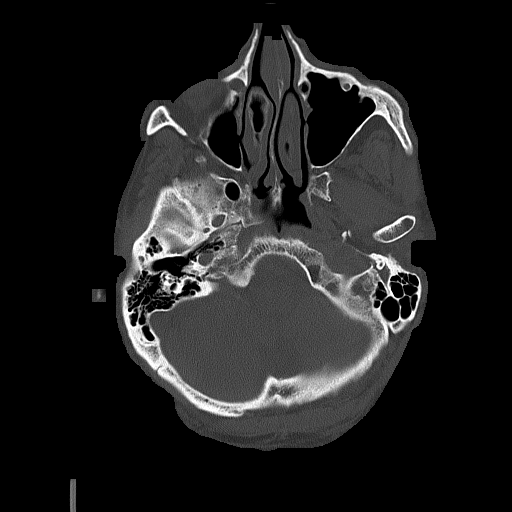
[im 28/92  bone]
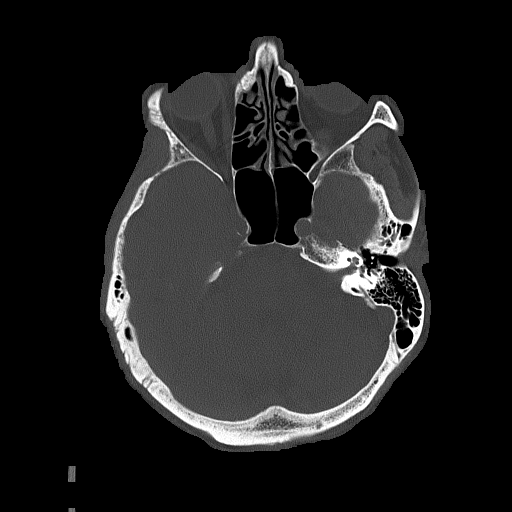

[Series 5: head without cor · coronal · non-contrast · 0.36mm/px · 3 of 76 slices shown]
[im 26/76  brain]
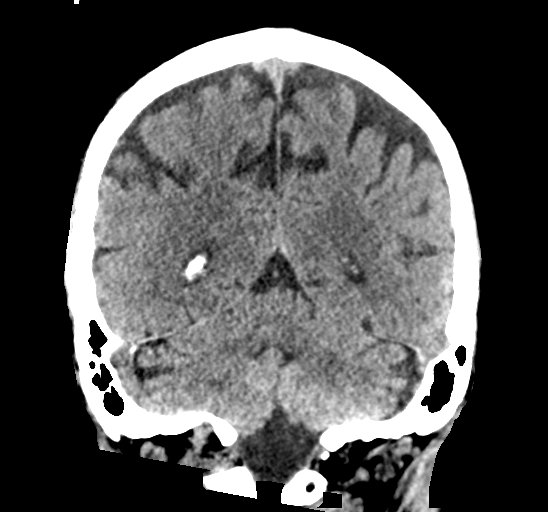
[im 34/76  brain]
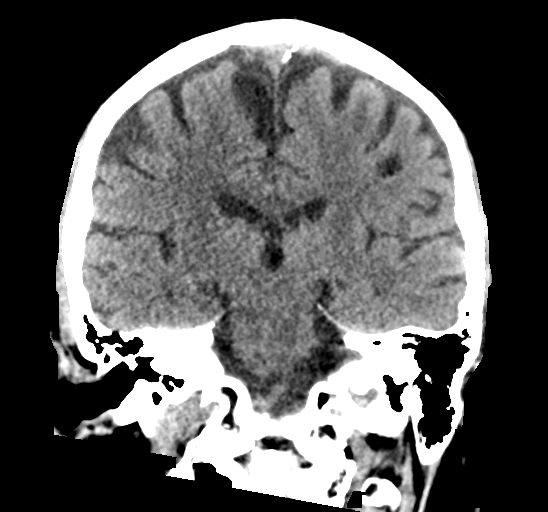
[im 42/76  brain]
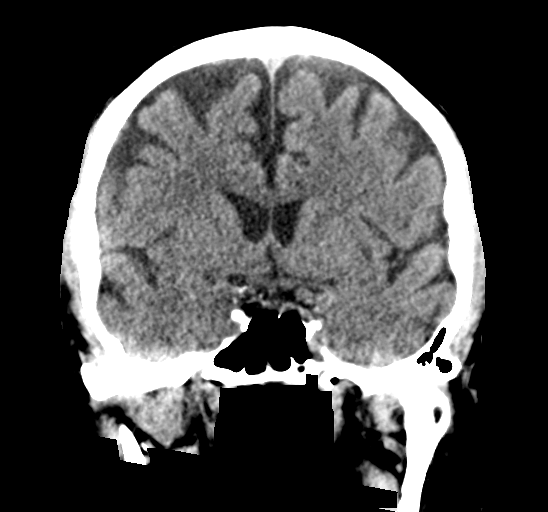

[Series 6: head without sag · sagittal · non-contrast · 0.38mm/px · 3 of 67 slices shown]
[im 26/67  brain]
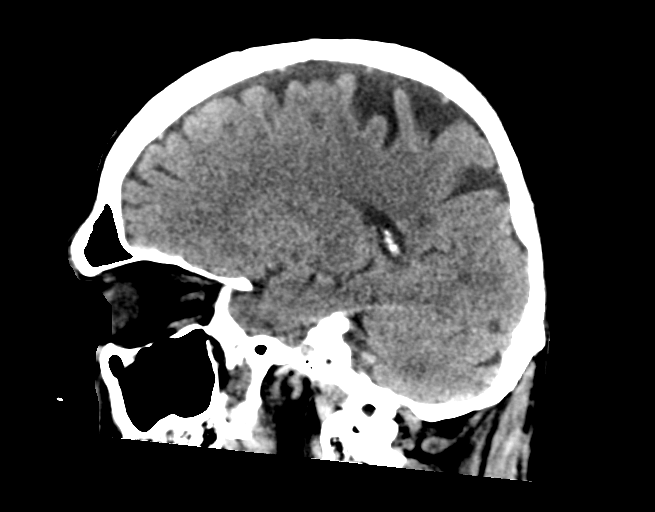
[im 34/67  brain]
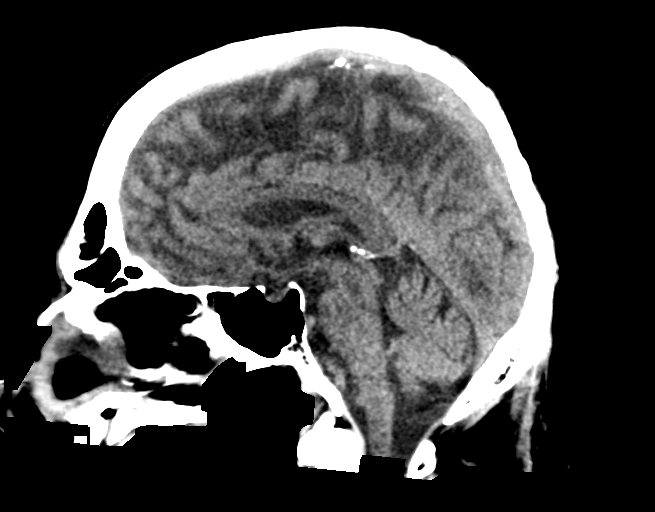
[im 41/67  brain]
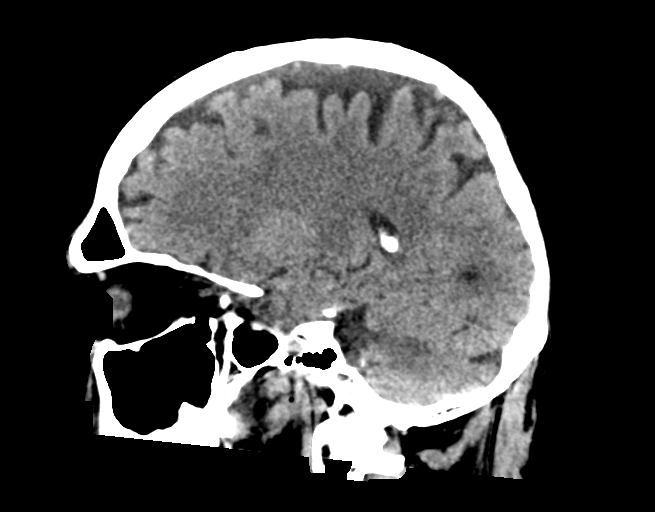

[16 of 47 positions shown; findings below may reference images not displayed]

FINDINGS: Brain: Mild age-related atrophy and chronic microvascular ischemic
changes. Subcentimeter right lentiform nucleus old lacunar infarct.
There is no acute intracranial hemorrhage. No mass effect or midline
shift. No extra-axial fluid collection.

Vascular: No hyperdense vessel or unexpected calcification.

Skull: Normal. Negative for fracture or focal lesion.

Sinuses/Orbits: Mild mucoperiosteal thickening of paranasal sinuses.
No air-fluid level. The mastoid air cells are clear.

Other: None
IMPRESSION: 1. No acute intracranial hemorrhage.
2. Age-related atrophy and chronic microvascular ischemic changes.
Small old right lentiform nucleus lacunar infarct.

## 2020-01-02 IMAGING — MR MR CERVICAL SPINE W/O CM
5 series · 37 of 48 positions shown · non-contrast
Comparison: None.

CLINICAL DATA: Chronic neck pain, intermittent left-sided weakness
and numbness

EXAM:
MRI CERVICAL SPINE WITHOUT CONTRAST
TECHNIQUE: Multiplanar, multisequence MR imaging of the cervical spine was
performed. No intravenous contrast was administered.

[Series 5: T2 · sagittal · 3.0mm · 0.69mm/px · 6 of 15 slices shown (1 of 2)]
[im 1/15]
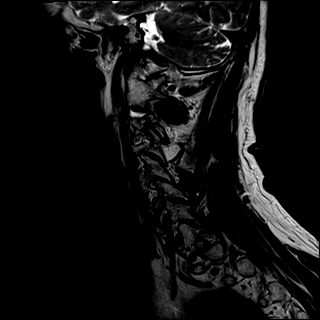
[im 3/15]
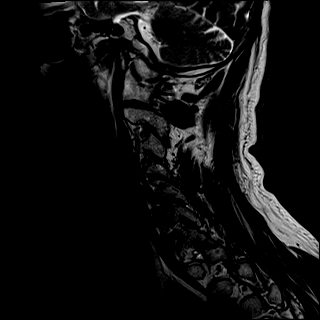
[im 6/15]
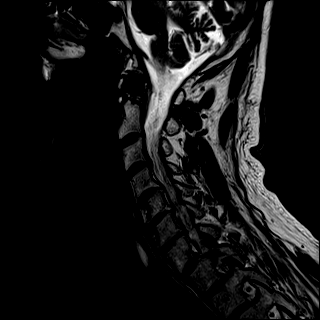
[im 9/15]
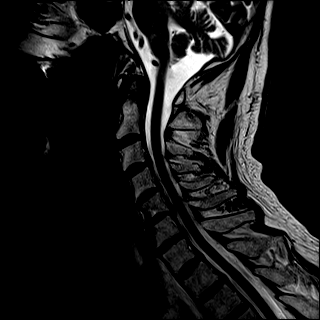
[im 12/15]
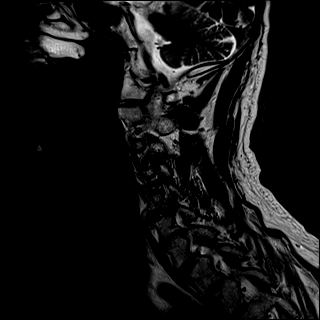
[im 15/15]
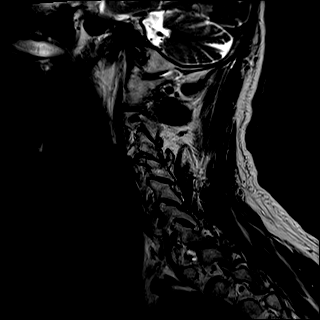

[Series 6: T1 · sagittal · 3.0mm · 0.69mm/px · 6 of 15 slices shown]
[im 1/15]
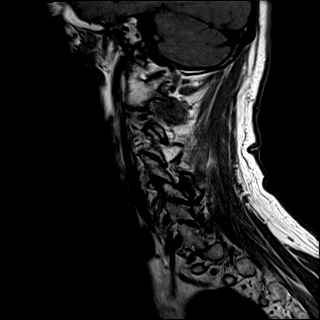
[im 3/15]
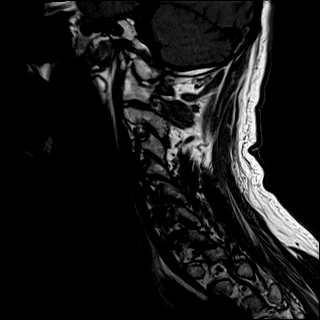
[im 6/15]
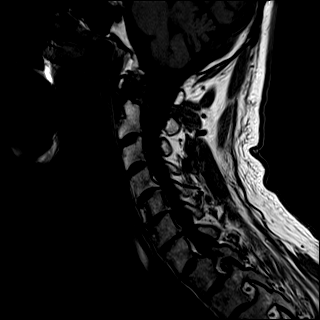
[im 9/15]
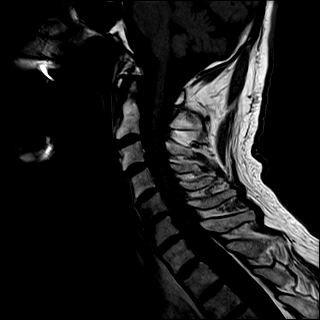
[im 12/15]
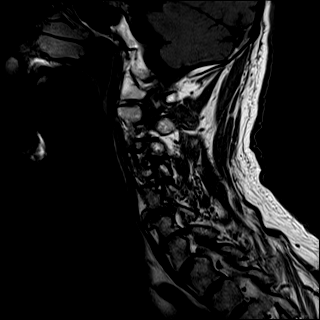
[im 15/15]
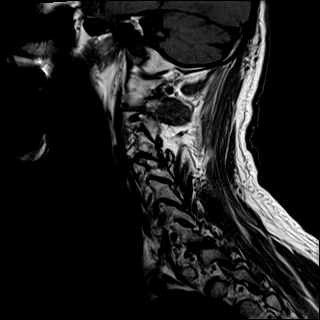

[Series 7: STIR · sagittal · 3.0mm · 0.86mm/px · 6 of 15 slices shown]
[im 1/15]
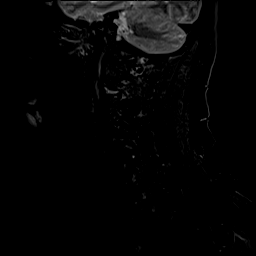
[im 3/15]
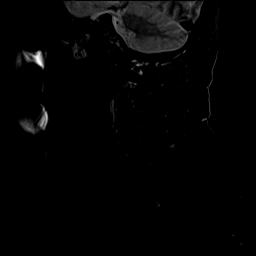
[im 6/15]
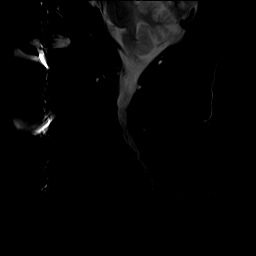
[im 9/15]
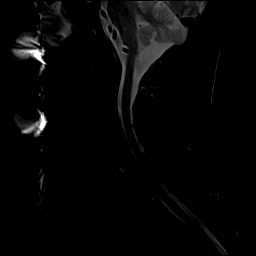
[im 12/15]
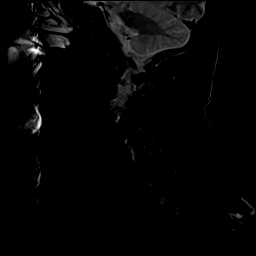
[im 15/15]
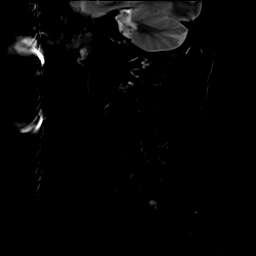

[Series 8: T2 · axial · 3.0mm · 0.66mm/px · z∈[-228,-109]mm · 11 of 40 slices shown (2 of 2)]
[im 1/40]
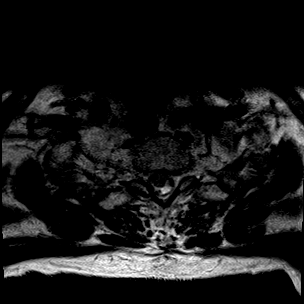
[im 3/40]
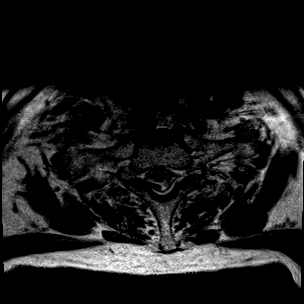
[im 6/40]
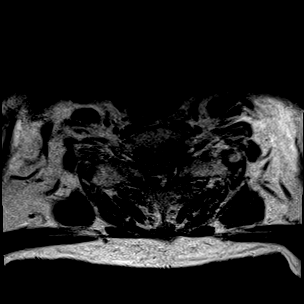
[im 9/40]
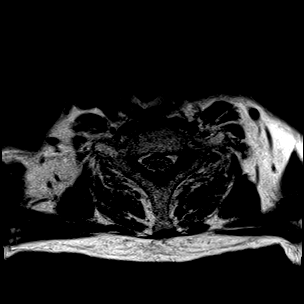
[im 12/40]
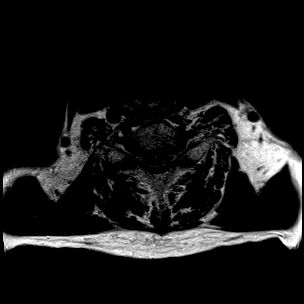
[im 17/40]
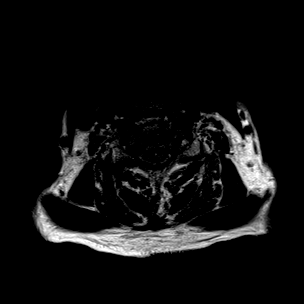
[im 20/40]
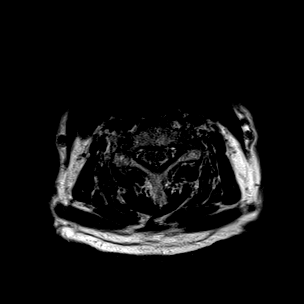
[im 23/40]
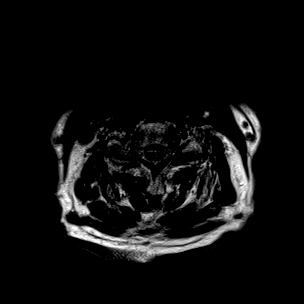
[im 28/40]
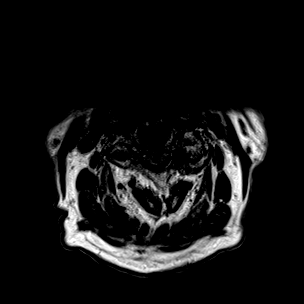
[im 34/40]
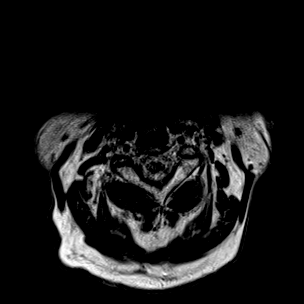
[im 40/40]
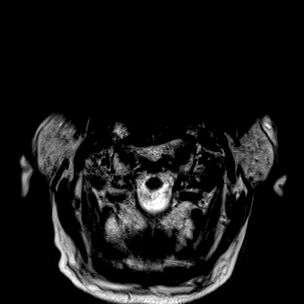

[Series 9: GRE · axial · 3.0mm · 0.39mm/px · z∈[-228,-109]mm · 8 of 40 slices shown]
[im 1/40]
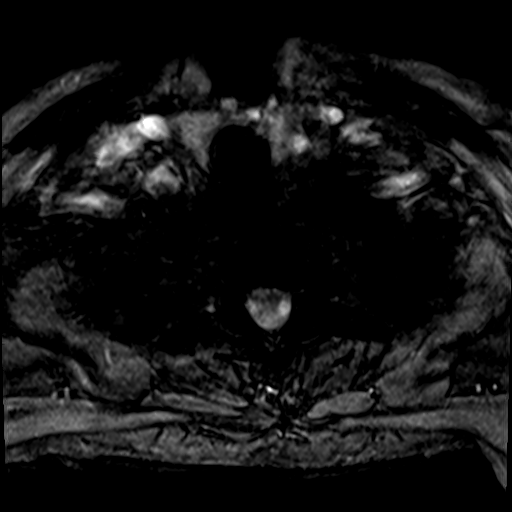
[im 6/40]
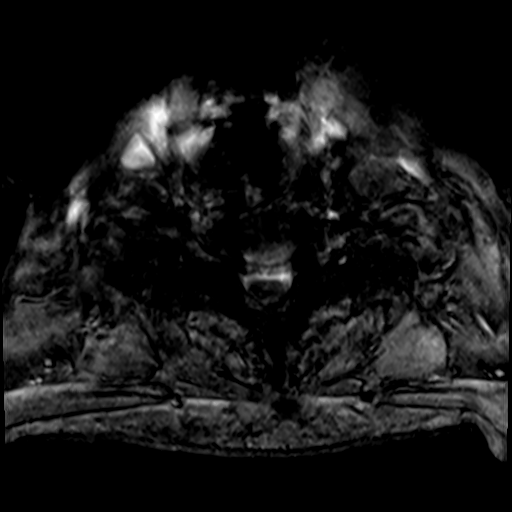
[im 12/40]
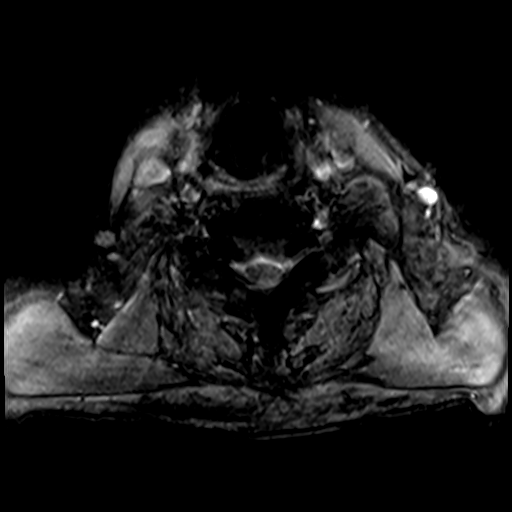
[im 17/40]
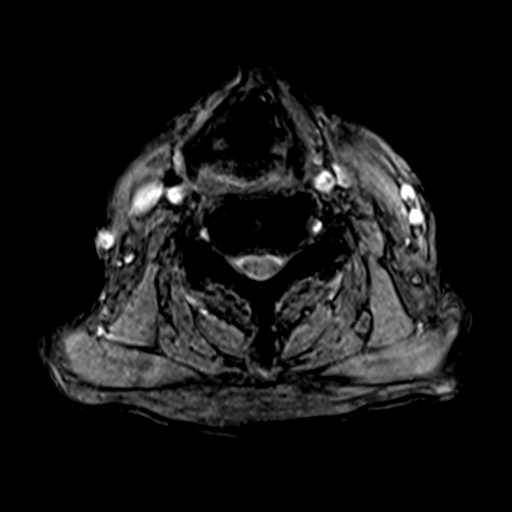
[im 23/40]
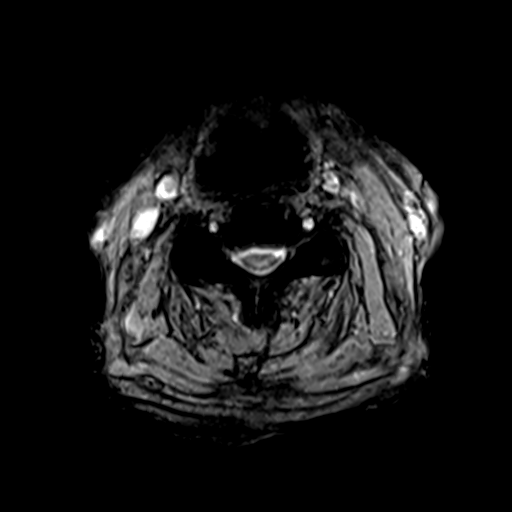
[im 28/40]
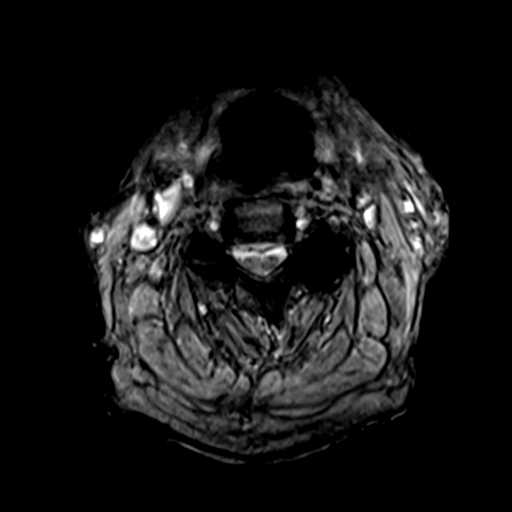
[im 34/40]
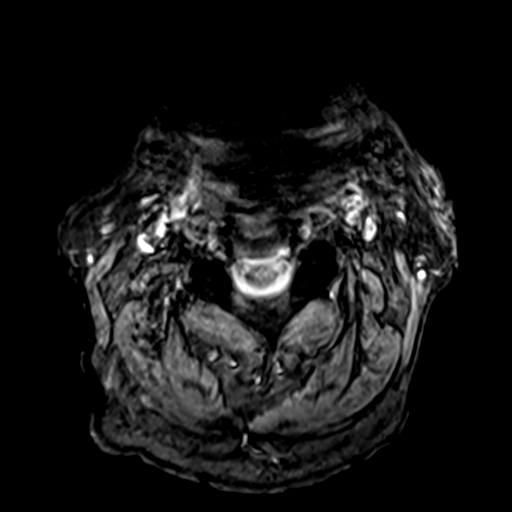
[im 40/40]
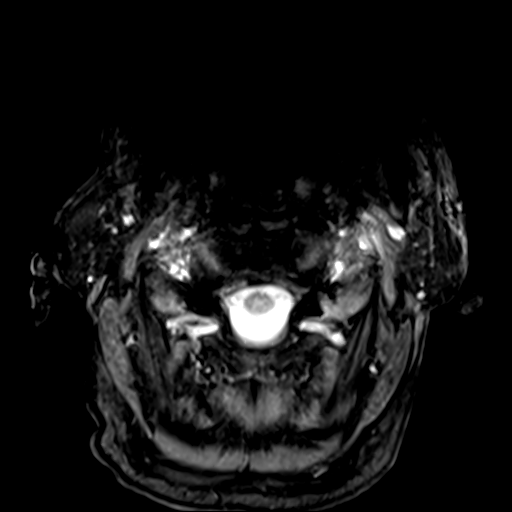

[37 of 48 positions shown; findings below may reference images not displayed]

FINDINGS: Alignment: There is a minimal retrolisthesis of C5 on C6.

Vertebrae: The vertebral body heights are well maintained. No
fracture, marrow edema,or pathologic marrow infiltration.

Cord: Normal signal and morphology.

Posterior Fossa, vertebral arteries, paraspinal tissues:

The visualized portion of the posterior fossa is unremarkable.
Normal flow voids seen within the vertebral arteries. The paraspinal
soft tissues are unremarkable.

Disc levels:

C1-C2: Atlanto-axial junction is normal, without canal narrowing

C2-C3: No significant spinal canal or neural foraminal narrowing

C3-C4: There is disc osteophyte complex and uncovertebral
osteophytes which causes severe bilateral neural foraminal narrowing
and mild central canal stenosis.

C4-C5: There is disc osteophyte complex and uncovertebral
osteophytes which causes severe bilateral neural foraminal narrowing
and mild central canal stenosis.

C5-C6: There is disc osteophyte complex and uncovertebral
osteophytes which causes severe bilateral neural foraminal narrowing
and central canal narrowing which measures 6 mm in AP diameter.

C6-C7: There is a disc osteophyte complex and uncovertebral
osteophytes which causes severe bilateral neural foraminal narrowing
and mild central canal stenosis.

C7-T1: No significant spinal canal or neural foraminal narrowing
IMPRESSION: Minimal retrolisthesis of C5 on C6.

Cervical spine spondylosis most notable at C5-C6 with severe
bilateral neural foraminal narrowing and mild to moderate central
canal stenosis.

## 2020-01-02 IMAGING — MR MR HEAD W/O CM
12 of 13 series · 44 of 48 positions shown · non-contrast
Comparison: CT head [DATE]

CLINICAL DATA: Focal neuro deficit with left-sided weakness and
numbness

EXAM:
MRI HEAD WITHOUT CONTRAST
TECHNIQUE: Multiplanar, multiecho pulse sequences of the brain and surrounding
structures were obtained without intravenous contrast.

[Series 5: DWI · axial · 3.0mm · 0.88mm/px · z∈[-36,+113]mm · 8 of 103 slices shown (1 of 4)]
[im 1/103]
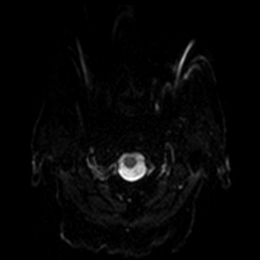
[im 15/103]
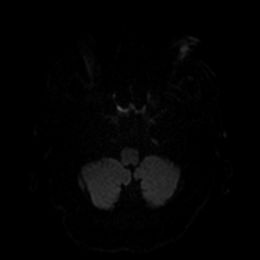
[im 30/103]
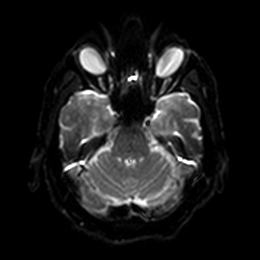
[im 44/103]
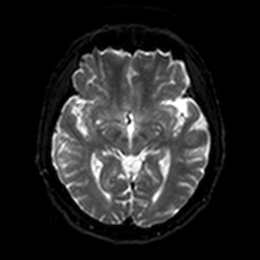
[im 59/103]
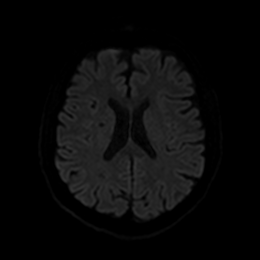
[im 73/103]
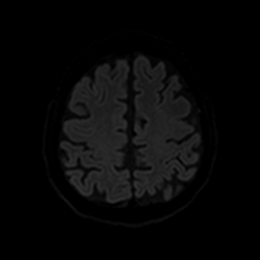
[im 88/103]
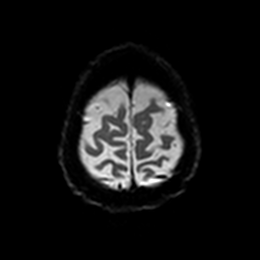
[im 103/103]
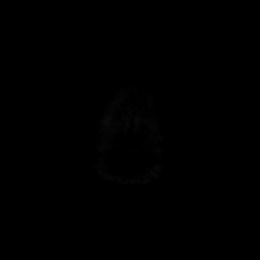

[Series 6: DWI · axial · 3.0mm · 0.88mm/px · z∈[-36,+113]mm · 4 of 52 slices shown (2 of 4)]
[im 1/52]
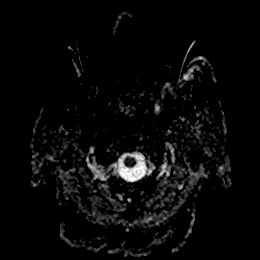
[im 18/52]
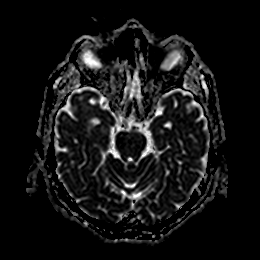
[im 35/52]
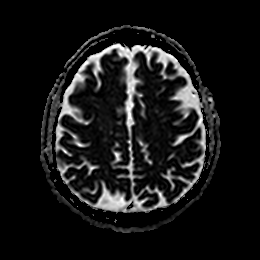
[im 52/52]
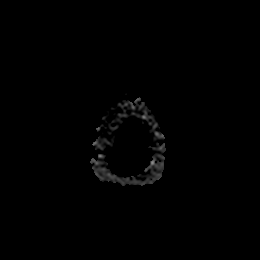

[Series 7: DWI · coronal · 4.0mm · 0.88mm/px · 5 of 76 slices shown (3 of 4)]
[im 1/76]
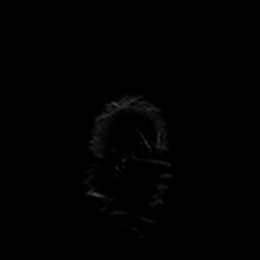
[im 19/76]
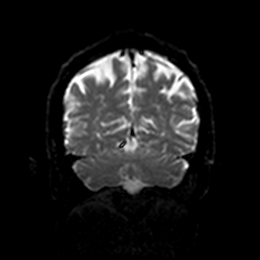
[im 38/76]
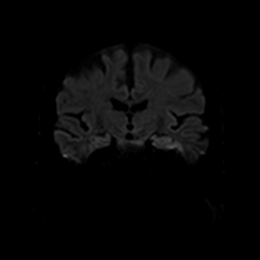
[im 57/76]
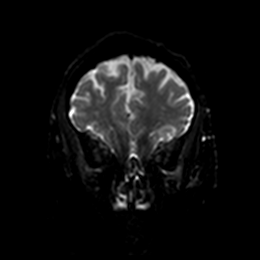
[im 76/76]
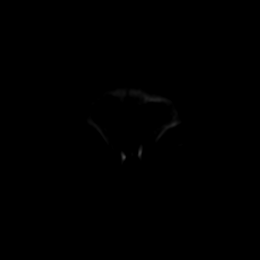

[Series 8: DWI · coronal · 4.0mm · 0.88mm/px · 3 of 38 slices shown (4 of 4)]
[im 1/38]
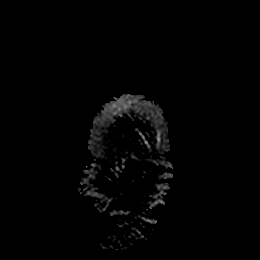
[im 19/38]
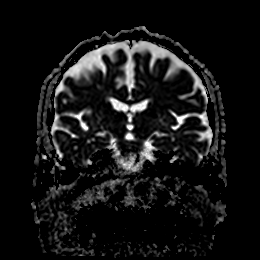
[im 38/38]
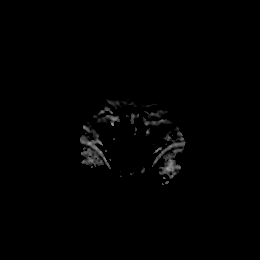

[Series 9: T1 · sagittal · 5.0mm · 0.75mm/px · 2 of 23 slices shown]
[im 1/23]
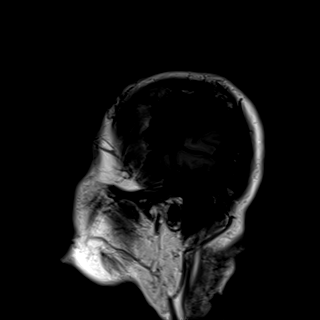
[im 23/23]
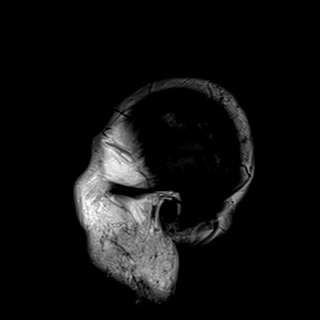

[Series 10: T2 · axial · 5.0mm · 0.72mm/px · z∈[-37,+104]mm · 2 of 25 slices shown (1 of 2)]
[im 1/25]
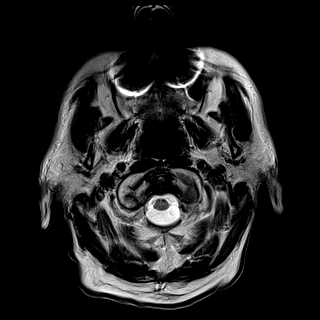
[im 25/25]
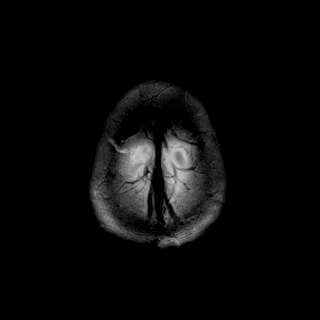

[Series 11: FLAIR · axial · 5.0mm · 0.45mm/px · z∈[-34,+106]mm · 2 of 25 slices shown]
[im 1/25]
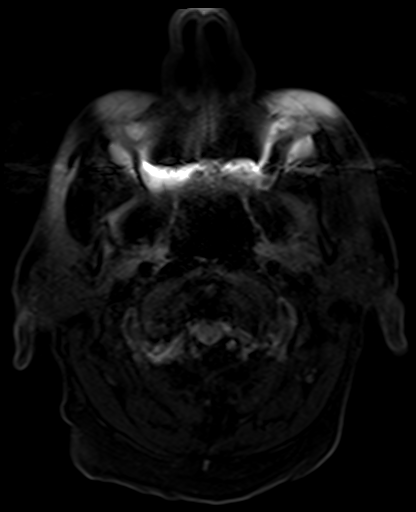
[im 25/25]
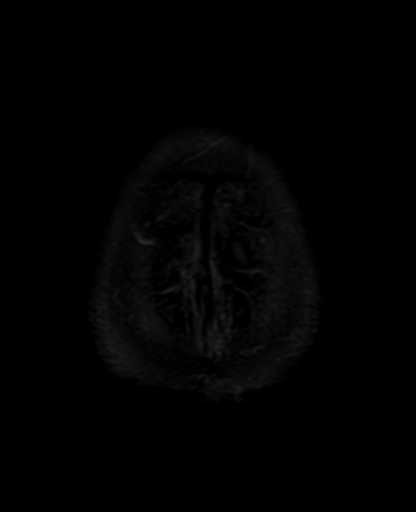

[Series 12: mag_images · axial · 3.0mm · 0.90mm/px · z∈[-46,+126]mm · 4 of 60 slices shown]
[im 1/60]
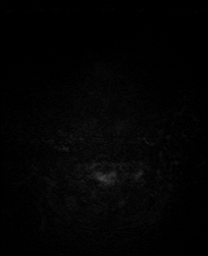
[im 20/60]
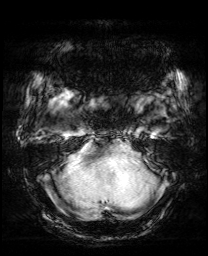
[im 40/60]
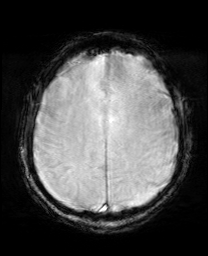
[im 60/60]
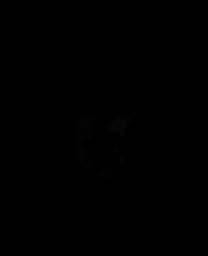

[Series 13: pha_images · axial · 3.0mm · 0.90mm/px · z∈[-46,+126]mm · 4 of 60 slices shown]
[im 1/60]
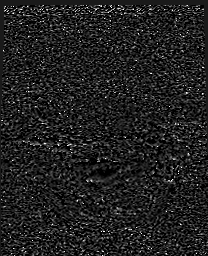
[im 20/60]
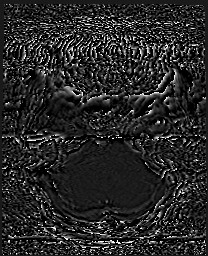
[im 40/60]
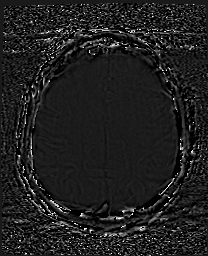
[im 60/60]
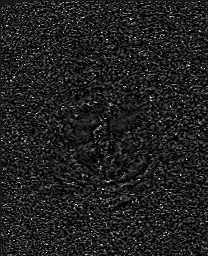

[Series 14: swi_images · axial · 3.0mm · 0.90mm/px · z∈[-46,+126]mm · 4 of 60 slices shown]
[im 1/60]
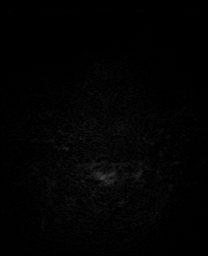
[im 20/60]
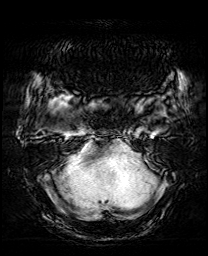
[im 40/60]
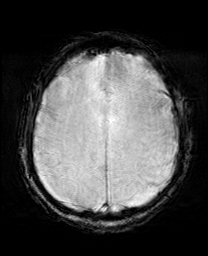
[im 60/60]
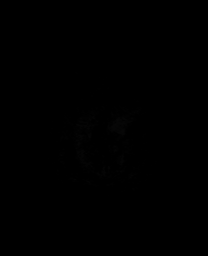

[Series 15: mip_images(sw) · axial · 24.0mm · 0.90mm/px · z∈[-36,+116]mm · 4 of 53 slices shown]
[im 1/53]
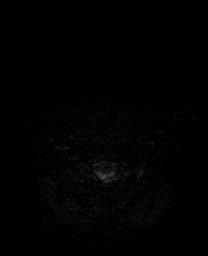
[im 18/53]
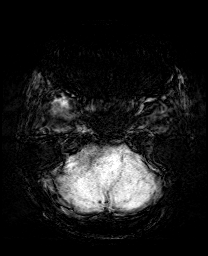
[im 35/53]
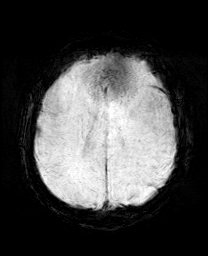
[im 53/53]
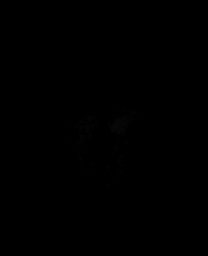

[Series 17: T2 · coronal · 5.0mm · 0.34mm/px · 2 of 29 slices shown (2 of 2)]
[im 1/29]
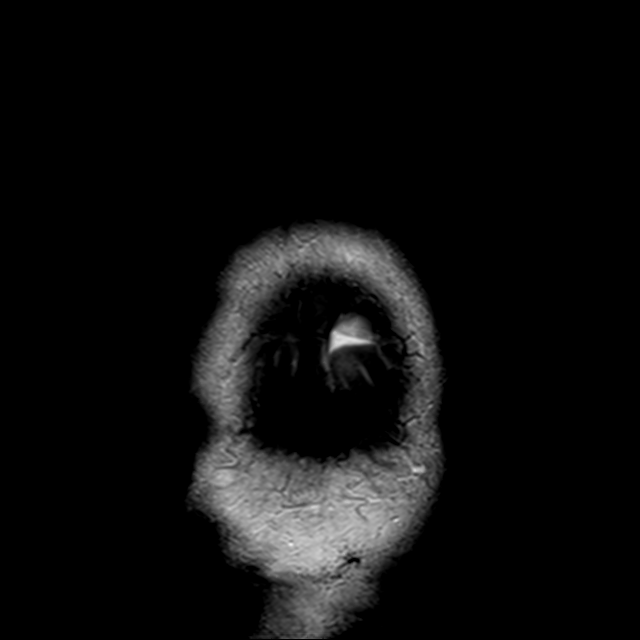
[im 29/29]
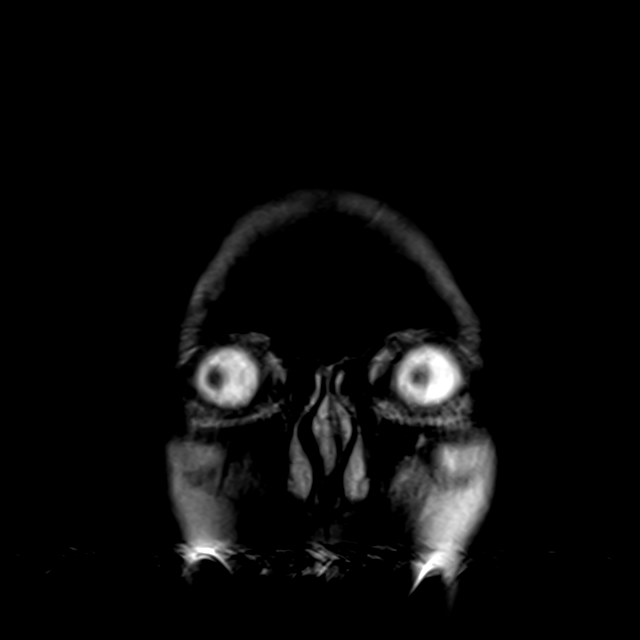

[44 of 48 positions shown; findings below may reference images not displayed]

FINDINGS: Brain: Negative for acute infarct. Mild chronic microvascular
ischemic change in the white matter. Negative for hemorrhage or
mass. Negative for hydrocephalus.

Vascular: Normal arterial flow voids

Skull and upper cervical spine: No focal skeletal lesion.

Sinuses/Orbits: Negative

Other: None
IMPRESSION: Negative for acute infarct. Mild chronic microvascular ischemic
change.

## 2020-01-02 MED ORDER — LORAZEPAM 2 MG/ML IJ SOLN
1.0000 mg | Freq: Once | INTRAMUSCULAR | Status: AC
  Administered 2020-01-02: 1 mg via INTRAVENOUS
  Filled 2020-01-02: qty 1

## 2020-01-02 MED ORDER — SODIUM CHLORIDE 0.9% FLUSH
3.0000 mL | Freq: Once | INTRAVENOUS | Status: DC
Start: 2020-01-02 — End: 2020-01-03

## 2020-01-02 NOTE — Telephone Encounter (Signed)
Patient called and said he has had numbness in his left arm, lip, face and leg that was worse last Saturday. He is concerned and wants an appointment.  Per Dr. Carlis Abbott, this may have been a mini stroke.  He suggests the patient goes to the ED for a workup.  Pt is aware and verbalized understanding.  Thurston Hole., LPN

## 2020-01-02 NOTE — ED Notes (Signed)
Pt went to mri 

## 2020-01-02 NOTE — Discharge Instructions (Addendum)
Continue medications as previously prescribed.  Follow-up with your primary doctor in the next week, and return to the ER if your symptoms significantly worsen or change.

## 2020-01-02 NOTE — ED Provider Notes (Signed)
[facility] EMERGENCY DEPARTMENT Provider Note   CSN: [id number] Arrival date & time: 01/02/20  1718     History Chief Complaint  Patient presents with  . Extremity Weakness    Thomas Marshall is a 79 y.o. male.  Patient is an 79 year old male with past medical history of coronary artery disease status post CABG, diabetes, hypertension, hyperlipidemia, and admission in December 2020 for possible stroke.  Patient underwent work-up, however no stroke was found.  Patient presents today with complaints of numbness to the left arm, leg, and face that occurred on Saturday.  Patient states that he was at the store with family and felt like his leg was dragging and was having difficulty ambulating.  Patient told his primary doctor this today and after the urging of family members presents for evaluation of this.  He states he feels somewhat better now.  He denies any headache or visual disturbances.  The history is provided by the patient.  Extremity Weakness This is a new problem. The problem has been gradually improving. Pertinent negatives include no chest pain and no headaches. Nothing aggravates the symptoms. Nothing relieves the symptoms. He has tried nothing for the symptoms.       Past Medical History:  Diagnosis Date  . AAA (abdominal aortic aneurysm) ([place])   . Arthritis   . Cancer (HCC)    skin & squamous cell  . Carotid artery disease ([place])    a. s/p R CEA 12/2015.  [name] CKD (chronic kidney disease), stage III    a. per historical labs.  . Coronary artery disease    a. s/p CABG 2004.  . Diabetes mellitus without complication ([place])   . GERD (gastroesophageal reflux disease)   . Hyperlipidemia   . Hypertension   . Medication intolerance Multiple   . PAF (paroxysmal atrial fibrillation) ([place])   . Pneumonia   . Sinus bradycardia    a. baseline HR 50s-60s, also h/o bradycardia on metoprolol and carvedilol    Patient Active Problem List   Diagnosis Date Noted   . Acute bursitis of right shoulder 12/20/2019  . Subconjunctival hemorrhage 12/01/2019  . TIA (transient ischemic attack) 11/20/2019  . Pancytopenia ([place]) 11/20/2019  . Left sided numbness   . Coagulation disorder ([place]) 10/31/2019  . Arthritis of right sacroiliac joint 07/21/2019  . Right lower quadrant abdominal swelling 10/12/2018  . Cough 12/09/2017  . PVCs (premature ventricular contractions) 10/27/2017  . PVC's (premature ventricular contractions) 09/05/2017  . Bradycardia 09/04/2017  . Cellulitis of right knee 08/11/2017  . Degenerative disc disease, lumbar 07/26/2017  . Atypical angina ([place])   . Right low back pain 05/18/2017  . Rash 04/08/2017  . Paroxysmal atrial fibrillation with RVR ([place]) 09/27/2016  . Persistent atrial fibrillation ([place])   . Erectile dysfunction 09/16/2016  . Hypomagnesemia 05/22/2016  . Essential hypertension 04/21/2016  . CAD (coronary artery disease) 03/02/2016  . Neck abscess 01/10/2016  . Carotid artery disease without cerebral infarction ([place]) 11/15/2015  . Right facial swelling 08/06/2015  . Benign prostatic hyperplasia 08/06/2015  . Abscess of left thigh 07/16/2015  . Cellulitis and abscess of leg 07/09/2015  . CKD (chronic kidney disease) stage 3, GFR 30-59 ml/min (HCC) 05/14/2015  . Carotid stenosis 05/01/2015  . PVD (peripheral vascular disease) ([place]) 04/09/2015  . AAA (abdominal aortic aneurysm) ([place]) 04/09/2015  . S/P CABG x 4 2004 09/10/2014  . Long term current use of anticoagulant therapy 09/04/2013  . Hx of squamous cell carcinoma of skin   .  Type 2 diabetes mellitus with renal manifestations ([place])   . GERD (gastroesophageal reflux disease)   . Hyperlipidemia     Past Surgical History:  Procedure Laterality Date  . ANKLE SURGERY     right fused  . BACK SURGERY     low back   . CHOLECYSTECTOMY N/A 12/07/2013   Procedure: LAPAROSCOPIC CHOLECYSTECTOMY ;  Surgeon: [name], MD;  Location: [facility];  Service: General;   Laterality: N/A;  . COLONOSCOPY    . CORONARY ARTERY BYPASS GRAFT  2004   [place]  . CORONARY STENT INTERVENTION N/A 05/21/2017   Procedure: Coronary Stent Intervention;  Surgeon: [name], MD;  Location: [facility];  Service: Cardiovascular;  Laterality: N/A;  . ENDARTERECTOMY Right 12/10/2015   Procedure: Right Carotid ENDARTERECTOMY;  Surgeon: Conrad Randall, MD;  Location: [facility];  Service: Vascular;  Laterality: Right;  . EXPLORATION POST OPERATIVE OPEN HEART    . FINGER SURGERY     skin graft on rt index  . FINGER SURGERY Right    right index finger.  [name] HERNIA REPAIR Right    RIH  . JOINT REPLACEMENT    . KNEE SURGERY     replacement on both knees  . LEFT HEART CATH AND CORS/GRAFTS ANGIOGRAPHY N/A 05/21/2017   Procedure: Left Heart Cath and Cors/Grafts Angiography;  Surgeon: [name], MD;  Location: [facility];  Service: Cardiovascular;  Laterality: N/A;  . PROSTATE SURGERY    . PVC ABLATION N/A 10/27/2017   Procedure: PVC ABLATION;  Surgeon: Evans Lance, MD;  Location: [facility];  Service: Cardiovascular;  Laterality: N/A;  . TONSILLECTOMY    . URINARY SURGERY     scar tissue       Family History  Problem Relation Age of Onset  . Stroke Mother   . Heart disease Mother   . Heart disease Father   . Hypertension Father   . Hyperlipidemia Father   . Cancer Sister        breast  and skin  . Diabetes Sister   . Hypertension Sister   . Diabetes Brother   . Heart disease Brother   . Hyperlipidemia Brother   . Hypertension Brother   . Early death Neg Hx     Social History   Tobacco Use  . Smoking status: Former Smoker    Packs/day: 1.00    Years: 13.00    Pack years: 13.00    Quit date: 11/30/1970    Years since quitting: 49.1  . Smokeless tobacco: Never Used  Substance Use Topics  . Alcohol use: No    Alcohol/week: 0.0 standard drinks  . Drug use: No    Home Medications Prior to Admission medications   Medication  Sig Start Date End Date Taking? Authorizing Provider  amLODipine (NORVASC) 10 MG tablet Take 1 tablet (10 mg total) by mouth daily. 01/19/17   [name], MD  apixaban (ELIQUIS) 2.5 MG TABS tablet Take 1 tablet (2.5 mg total) by mouth 2 (two) times daily. (0800 & 2000) 08/03/19   Almyra Deforest, PA  aspirin EC 81 MG tablet Take 1 tablet (81 mg total) by mouth daily. 07/27/19   Bhagat, Crista Luria, PA  atorvastatin (LIPITOR) 40 MG tablet Take 40 mg by mouth at bedtime.     [provider]  cholecalciferol (VITAMIN D) 1000 UNITS tablet Take 1,000 Units by mouth every evening.     [provider]  cloNIDine (CATAPRES) 0.1 MG  tablet Take 1 tablet (0.1 mg total) by mouth 2 (two) times daily. 07/20/19   Bhagat, Crista Luria, PA  Cyanocobalamin 1000 MCG/ML KIT Inject 1,000 mcg as directed every 30 (thirty) days.    [provider]  dofetilide (TIKOSYN) 250 MCG capsule Take 1 capsule (250 mcg total) by mouth 2 (two) times daily. 09/30/16   Cheryln Manly, NP  gabapentin (NEURONTIN) 100 MG capsule Take 2 capsules (200 mg total) by mouth at bedtime. Patient taking differently: Take 200 mg by mouth daily as needed.  06/26/19   Lyndal Pulley, DO  glucose blood test strip Accu-Check Guide Strips. Dx E11.9 Use to test blood glucose level 2 times a day. 08/05/18   [name], MD  magnesium oxide (MAG-OX) 400 MG tablet Take 1 tablet (400 mg total) by mouth 4 (four) times daily. Patient taking differently: Take 800 mg by mouth 2 (two) times daily.  12/03/16   [name], MD  metFORMIN (GLUCOPHAGE) 1000 MG tablet TAKE 1 TABLET BY MOUTH TWICE DAILY WITH A MEAL Patient taking differently: Take 1,000 mg by mouth 2 (two) times daily with a meal.  10/30/19   [name], MD  nitroGLYCERIN (NITROSTAT) 0.4 MG SL tablet Place 1 tablet (0.4 mg total) under the tongue every 5 (five) minutes as needed for chest pain (x 3 doses). 05/22/16   Virginia Crews, MD  pantoprazole (PROTONIX) 40 MG  tablet Take 1 tablet (40 mg total) by mouth daily. 06/08/17 11/20/19  Leanor Kail, PA  propranolol (INDERAL) 20 MG tablet Take one tablet by mouth PRN for irregular heartbeat.  Maximum 2 tablets in 24 hours. Patient taking differently: Take 20 mg by mouth See admin instructions. Take one tablet by mouth PRN for irregular heartbeat.  Maximum 2 tablets in 24 hours. 03/18/18   Evans Lance, MD  terazosin (HYTRIN) 1 MG capsule Take 1 mg by mouth at bedtime. (2100) 01/07/17   [name], MD  vitamin C (ASCORBIC ACID) 500 MG tablet Take 500 mg by mouth daily.     [provider]    Allergies    Lisinopril, Losartan, Nifedipine, Triamterene, Hydralazine hcl, Amoxicillin, Ampicillin, and Carvedilol  Review of Systems   Review of Systems  Cardiovascular: Negative for chest pain.  Musculoskeletal: Positive for extremity weakness.  Neurological: Negative for headaches.  All other systems reviewed and are negative.   Physical Exam Updated Vital Signs BP (!) 159/98   Pulse 63   Temp 97.6 F (36.4 C) (Oral)   Resp 19   SpO2 100%   Physical Exam Vitals and nursing note reviewed.  Constitutional:      General: He is not in acute distress.    Appearance: Normal appearance. He is well-developed. He is not ill-appearing or diaphoretic.  HENT:     Head: Normocephalic and atraumatic.  Eyes:     Extraocular Movements: Extraocular movements intact.     Pupils: Pupils are equal, round, and reactive to light.  Cardiovascular:     Rate and Rhythm: Normal rate and regular rhythm.     Heart sounds: No murmur. No friction rub.  Pulmonary:     Effort: Pulmonary effort is normal. No respiratory distress.     Breath sounds: Normal breath sounds. No wheezing or rales.  Abdominal:     General: Bowel sounds are normal. There is no distension.     Palpations: Abdomen is soft.     Tenderness: There is no abdominal tenderness.  Musculoskeletal:  General: Normal range of motion.      Cervical back: Normal range of motion and neck supple.  Skin:    General: Skin is warm and dry.  Neurological:     General: No focal deficit present.     Mental Status: He is alert and oriented to person, place, and time.     Cranial Nerves: No cranial nerve deficit.     Sensory: No sensory deficit.     Motor: No weakness.     Coordination: Coordination normal.     ED Results / Procedures / Treatments   Labs (all labs ordered are listed, but only abnormal results are displayed) Labs Reviewed  CBC - Abnormal; Notable for the following components:      Result Value   RBC 3.69 (*)    Hemoglobin 10.7 (*)    HCT 34.8 (*)    All other components within normal limits  COMPREHENSIVE METABOLIC PANEL - Abnormal; Notable for the following components:   Glucose, Bld 165 (*)    BUN 32 (*)    Creatinine, Ser 1.73 (*)    Calcium 8.8 (*)    Total Protein 6.4 (*)    GFR calc non Af Amer 36 (*)    GFR calc Af Amer 42 (*)    All other components within normal limits  PROTIME-INR  APTT  DIFFERENTIAL    EKG None  Radiology CT HEAD WO CONTRAST  Result Date: 01/02/2020 CLINICAL DATA:  79 year old male with focal neurologic deficit and left-sided weakness. EXAM: CT HEAD WITHOUT CONTRAST TECHNIQUE: Contiguous axial images were obtained from the base of the skull through the vertex without intravenous contrast. COMPARISON:  Head CT dated 11/20/2019. FINDINGS: Brain: Mild age-related atrophy and chronic microvascular ischemic changes. Subcentimeter right lentiform nucleus old lacunar infarct. There is no acute intracranial hemorrhage. No mass effect or midline shift. No extra-axial fluid collection. Vascular: No hyperdense vessel or unexpected calcification. Skull: Normal. Negative for fracture or focal lesion. Sinuses/Orbits: Mild mucoperiosteal thickening of paranasal sinuses. No air-fluid level. The mastoid air cells are clear. Other: None IMPRESSION: 1. No acute intracranial hemorrhage. 2.  Age-related atrophy and chronic microvascular ischemic changes. Small old right lentiform nucleus lacunar infarct. Electronically Signed   By: Anner Crete M.D.   On: 01/02/2020 18:44    Procedures Procedures (including critical care time)  Medications Ordered in ED Medications  sodium chloride flush (NS) 0.9 % injection 3 mL (3 mLs Intravenous Not Given 01/02/20 1936)    ED Course  I have reviewed the triage vital signs and the nursing notes.  Pertinent labs & imaging results that were available during my care of the patient were reviewed by me and considered in my medical decision making (see chart for details).    MDM Rules/Calculators/A&P  Patient is an 79 year old male presenting with complaints of numbness of his left leg, left arm and left face.  This occurred on Saturday but has since nearly completely resolved.  Patient had a similar episode in December and underwent full neurologic work-up, but no cause was found.  Patient's studies today are all essentially unremarkable.  His head CT is negative and laboratory studies are within normal limits.  An MRI was obtained of the brain and cervical spine showing no acute stroke, but does have findings of chronic degenerative disc disease.  At this point, I found nothing emergent to explain his symptoms.  He seems neurologically intact today and symptoms occurred 3 days ago.  At this point, I feel  as though discharge is appropriate with outpatient follow-up.  Final Clinical Impression(s) / ED Diagnoses Final diagnoses:  None    Rx / DC Orders ED Discharge Orders    None       Veryl Speak, MD 01/02/20 2214

## 2020-01-02 NOTE — ED Triage Notes (Addendum)
Pt reports intermittent L sided weakness/numbness since December.  States he has L leg weakness at present but it was much worse on Saturday and he could barely walk.  Also reports tingling to L side of face.  No arm drift.

## 2020-01-08 ENCOUNTER — Ambulatory Visit: Payer: Medicare Other | Attending: Internal Medicine

## 2020-01-08 DIAGNOSIS — Z23 Encounter for immunization: Secondary | ICD-10-CM | POA: Insufficient documentation

## 2020-01-08 NOTE — Progress Notes (Signed)
   Covid-19 Vaccination Clinic  Name:  Thomas Marshall    MRN: [id number] DOB: 06/08/1941  01/08/2020  [name] was observed post Covid-19 immunization for 15 minutes without incidence. He was provided with Vaccine Information Sheet and instruction to access the V-Safe system.   [name] was instructed to call 911 with any severe reactions post vaccine: [name] Difficulty breathing  . Swelling of your face and throat  . A fast heartbeat  . A bad rash all over your body  . Dizziness and weakness    Immunizations Administered    Name Date Dose VIS Date Route   Pfizer COVID-19 Vaccine 01/08/2020 10:58 AM 0.3 mL 11/10/2019 Intramuscular   Manufacturer: [place]   Lot: [id number]   [place]: [id number]

## 2020-01-09 ENCOUNTER — Ambulatory Visit (INDEPENDENT_AMBULATORY_CARE_PROVIDER_SITE_OTHER): Payer: Medicare Other | Admitting: Family Medicine

## 2020-01-09 ENCOUNTER — Other Ambulatory Visit: Payer: Self-pay

## 2020-01-09 ENCOUNTER — Encounter: Payer: Self-pay | Admitting: Family Medicine

## 2020-01-09 VITALS — BP 144/64 | HR 75 | Ht 74.0 in | Wt 214.0 lb

## 2020-01-09 DIAGNOSIS — N1832 Chronic kidney disease, stage 3b: Secondary | ICD-10-CM | POA: Diagnosis not present

## 2020-01-09 DIAGNOSIS — M5136 Other intervertebral disc degeneration, lumbar region: Secondary | ICD-10-CM | POA: Diagnosis not present

## 2020-01-09 DIAGNOSIS — M4802 Spinal stenosis, cervical region: Secondary | ICD-10-CM | POA: Insufficient documentation

## 2020-01-09 NOTE — Assessment & Plan Note (Signed)
Patient was found to have more of a hypercalcemia.  Patient does have known carotid stenosis but MRI of the cervical spine does show some degenerative disc disease.  Patient has not had any episodes since the last time he went to the emergency room.  Do not feel any of the likelihood of medications likely contributing at the moment.  Discussed icing regimen, we discussed which signs and symptoms and when to seek medical attention.  Spent greater than 35 minutes reviewing her images in patient's chart.  Patient and then another 18 minutes with patient today.

## 2020-01-09 NOTE — Assessment & Plan Note (Signed)
Cervical spinal stenosis.  Discussed icing regimen and home exercises, patient is on a blood thinner and will have to hold if we did decide to do an epidural baclofen if necessary.  Patient has not had any other signs and symptoms at the moment.  Good strength of the upper extremities.  No change in management at the moment.

## 2020-01-09 NOTE — Patient Instructions (Addendum)
Calcium 1200mg  daily or Tums daily If occurs more often keep journal and send me message If worsening go to ED See me when you need me

## 2020-01-09 NOTE — Progress Notes (Signed)
[place] Phone: [phone] Subjective:   Thomas Marshall, am serving as a scribe for Dr. Hulan Saas. This visit occurred during the SARS-CoV-2 public health emergency.  Safety protocols were in place, including screening questions prior to the visit, additional usage of staff PPE, and extensive cleaning of exam room while observing appropriate contact time as indicated for disinfecting solutions.   I'm seeing this patient by the request  of:  [name], MD  CC: Neck pain, back pain.  Numbness  QDI:YMEBRAXENM  Thomas Marshall is a 79 y.o. male coming in with complaint of left leg numbness. Presented to  ED on 01/02/2020. Patient states that he was having left arm and facial numbness. Three weeks ago patient had arm and leg numbness. Is here to follow up for neck CT results.    Head CT scan old infarct  MRI brain normal  MRI of neck DDD C5-6 severe bilateral stenosis and moderate spinal stenosis   CT back 08/2019- moderate spinal stenosis right L3/4  Laboratory work-up did show patient was hypocalcemic at 8.8 patient's creatinine was mildly elevated at 1.7 patient has baseline of 1.6.  Otherwise fairly unremarkable  Patient states since this episode though he has not had any other significant difficulty.  Since then there has been Marshall numbness.  Has not noticed any muscle weakness at the moment.  Patient denies any headaches, visual changes, any chest pain associated.  Past Medical History:  Diagnosis Date  . AAA (abdominal aortic aneurysm) ([place])   . Arthritis   . Cancer (H[facility])    skin & squamous cell  . Carotid artery disease ([place])    a. s/p R CEA 12/2015.  [name] CKD (chronic kidney disease), stage III    a. per historical labs.  . Coronary artery disease    a. s/p CABG 2004.  . Diabetes mellitus without complication ([place])   . GERD (gastroesophageal reflux disease)   . Hyperlipidemia   . Hypertension   .  Medication intolerance Multiple   . PAF (paroxysmal atrial fibrillation) ([place])   . Pneumonia   . Sinus bradycardia    a. baseline HR 50s-60s, also h/o bradycardia on metoprolol and carvedilol   Past Surgical History:  Procedure Laterality Date  . ANKLE SURGERY     right fused  . BACK SURGERY     low back   . CHOLECYSTECTOMY N/A 12/07/2013   Procedure: LAPAROSCOPIC CHOLECYSTECTOMY ;  Surgeon: [name], MD;  Location: [facility];  Service: General;  Laterality: N/A;  . COLONOSCOPY    . CORONARY ARTERY BYPASS GRAFT  2004   [facility]  . CORONARY STENT INTERVENTION N/A 05/21/2017   Procedure: Coronary Stent Intervention;  Surgeon: [name], MD;  Location: [facility];  Service: Cardiovascular;  Laterality: N/A;  . ENDARTERECTOMY Right 12/10/2015   Procedure: Right Carotid ENDARTERECTOMY;  Surgeon: Conrad Ripley, MD;  Location: [facility];  Service: Vascular;  Laterality: Right;  . EXPLORATION POST OPERATIVE OPEN HEART    . FINGER SURGERY     skin graft on rt index  . FINGER SURGERY Right    right index finger.  [name] HERNIA REPAIR Right    [facility]  . JOINT REPLACEMENT    . KNEE SURGERY     replacement on both knees  . LEFT HEART CATH AND CORS/GRAFTS ANGIOGRAPHY N/A 05/21/2017   Procedure: Left Heart Cath and Cors/Grafts Angiography;  Surgeon: [name],  MD;  Location: [facility];  Service: Cardiovascular;  Laterality: N/A;  . PROSTATE SURGERY    . PVC ABLATION N/A 10/27/2017   Procedure: PVC ABLATION;  Surgeon: Evans Lance, MD;  Location: [facility];  Service: Cardiovascular;  Laterality: N/A;  . TONSILLECTOMY    . URINARY SURGERY     scar tissue   Social History   Socioeconomic History  . Marital status: Married    Spouse name: Not on file  . Number of children: 2  . Years of education: Not on file  . Highest education level: Not on file  Occupational History  . Occupation: retired  Tobacco Use  . Smoking status: Former Smoker    Packs/day:  1.00    Years: 13.00    Pack years: 13.00    Quit date: 11/30/1970    Years since quitting: 49.1  . Smokeless tobacco: Never Used  Substance and Sexual Activity  . Alcohol use: Marshall    Alcohol/week: 0.0 standard drinks  . Drug use: Marshall  . Sexual activity: Yes  Other Topics Concern  . Not on file  Social History Narrative  . Not on file   Social Determinants of Health   Financial Resource Strain:   . Difficulty of Paying Living Expenses: Not on file  Food Insecurity:   . Worried About Charity fundraiser in the Last Year: Not on file  . Ran Out of Food in the Last Year: Not on file  Transportation Needs:   . Lack of Transportation (Medical): Not on file  . Lack of Transportation (Non-Medical): Not on file  Physical Activity: Sufficiently Active  . Days of Exercise per Week: 4 days  . Minutes of Exercise per Session: 40 min  Stress:   . Feeling of Stress : Not on file  Social Connections:   . Frequency of Communication with Friends and Family: Not on file  . Frequency of Social Gatherings with Friends and Family: Not on file  . Attends Religious Services: Not on file  . Active Member of Clubs or Organizations: Not on file  . Attends Archivist Meetings: Not on file  . Marital Status: Not on file   Allergies  Allergen Reactions  . Lisinopril Swelling and Other (See Comments)    Angioedema.   . Losartan Swelling and Other (See Comments)    Lips swell Angioedema   . Nifedipine Swelling and Other (See Comments)    Sugar increase  . Triamterene Swelling and Other (See Comments)    Face swells, Marshall breathing impairment  . Hydralazine Hcl Other (See Comments)    Fatigue; poor appetite  . Losartan Potassium-Hctz Swelling and Other (See Comments)  . Amoxicillin Rash and Other (See Comments)    Has patient had a PCN reaction causing immediate rash, facial/tongue/throat swelling, SOB or lightheadedness with hypotension:Marshall Has patient had a PCN reaction causing severe  rash involving mucus membranes or skin necrosis:Marshall Has patient had a PCN reaction that required hospitalization:Marshall Has patient had a PCN reaction occurring within the last 10 years: Marshall If all of the above answers are "Marshall", then may proceed with Cephalosporin use.   . Ampicillin Rash and Other (See Comments)    Has patient had a PCN reaction causing immediate rash, facial/tongue/throat swelling, SOB or lightheadedness with hypotension:Marshall Has patient had a PCN reaction causing severe rash involving mucus membranes or skin necrosis:Marshall Has patient had a PCN reaction that required hospitalization:Marshall Has patient had a PCN  reaction occurring within the last 10 years: Marshall If all of the above answers are "Marshall", then may proceed with Cephalosporin use.    . Carvedilol Other (See Comments)    Low heart rate    Family History  Problem Relation Age of Onset  . Stroke Mother   . Heart disease Mother   . Heart disease Father   . Hypertension Father   . Hyperlipidemia Father   . Cancer Sister        breast  and skin  . Diabetes Sister   . Hypertension Sister   . Diabetes Brother   . Heart disease Brother   . Hyperlipidemia Brother   . Hypertension Brother   . Early death Neg Hx     Current Outpatient Medications (Endocrine & Metabolic):  .  metFORMIN (GLUCOPHAGE) 1000 MG tablet, TAKE 1 TABLET BY MOUTH TWICE DAILY WITH A MEAL (Patient taking differently: Take 1,000 mg by mouth 2 (two) times daily with a meal. )  Current Outpatient Medications (Cardiovascular):  .  amLODipine (NORVASC) 10 MG tablet, Take 1 tablet (10 mg total) by mouth daily. [name]  atorvastatin (LIPITOR) 40 MG tablet, Take 40 mg by mouth at bedtime.  .  cloNIDine (CATAPRES) 0.1 MG tablet, Take 1 tablet (0.1 mg total) by mouth 2 (two) times daily. [name]  dofetilide (TIKOSYN) 250 MCG capsule, Take 1 capsule (250 mcg total) by mouth 2 (two) times daily. .  nitroGLYCERIN (NITROSTAT) 0.4 MG SL tablet, Place 1 tablet (0.4 mg total) under  the tongue every 5 (five) minutes as needed for chest pain (x 3 doses). (Patient taking differently: Place 0.4 mg under the tongue every 5 (five) minutes x 3 doses as needed for chest pain. ) .  propranolol (INDERAL) 20 MG tablet, Take one tablet by mouth PRN for irregular heartbeat.  Maximum 2 tablets in 24 hours. [name]  terazosin (HYTRIN) 1 MG capsule, Take 1 mg by mouth at bedtime. (2100)   Current Outpatient Medications (Analgesics):  .  aspirin EC 81 MG tablet, Take 1 tablet (81 mg total) by mouth daily.  Current Outpatient Medications (Hematological):  .  apixaban (ELIQUIS) 2.5 MG TABS tablet, Take 1 tablet (2.5 mg total) by mouth 2 (two) times daily. (0800 & 2000) .  Cyanocobalamin 1000 MCG/ML KIT, Inject 1,000 mcg as directed every 30 (thirty) days.  Current Outpatient Medications (Other):  [name]  Cholecalciferol (VITAMIN D-3) 25 MCG (1000 UT) CAPS, Take 1,000 Units by mouth at bedtime. .  gabapentin (NEURONTIN) 100 MG capsule, Take 2 capsules (200 mg total) by mouth at bedtime. [name]  glucose blood test strip, Accu-Check Guide Strips. Dx E11.9 Use to test blood glucose level 2 times a day. .  magnesium oxide (MAG-OX) 400 MG tablet, Take 1 tablet (400 mg total) by mouth 4 (four) times daily. (Patient taking differently: Take 800 mg by mouth 2 (two) times daily. ) .  vitamin C (ASCORBIC ACID) 500 MG tablet, Take 1,000 mg by mouth daily with breakfast.  .  pantoprazole (PROTONIX) 40 MG tablet, Take 1 tablet (40 mg total) by mouth daily.   Reviewed prior external information including notes and imaging from  primary care provider As well as notes that were available from care everywhere and other healthcare systems.  Past medical history, social, surgical and family history all reviewed in electronic medical record.  Marshall pertanent information unless stated regarding to the chief complaint.   Review of Systems:  Marshall headache, visual changes, nausea, vomiting, diarrhea, constipation, dizziness,  abdominal pain, skin rash, fevers, chills, night sweats, weight loss, swollen lymph nodes, body aches, joint swelling, chest pain, shortness of breath, mood changes. POSITIVE muscle aches  Objective  Blood pressure (!) 144/64, pulse 75, height '6\' 2"'$  (1.88 m), weight 214 lb (97.1 kg), SpO2 98 %.    General: Marshall apparent distress alert and oriented x3 mood and affect normal, dressed appropriately.  HEENT: Pupils equal, extraocular movements intact  Respiratory: Patient's speak in full sentences and does not appear short of breath  Cardiovascular: Marshall lower extremity edema, non tender, Marshall erythema  Skin: Warm dry intact with Marshall signs of infection or rash on extremities or on axial skeleton.  Abdomen: Soft nontender  Neuro: Cranial nerves II through XII are intact, neurovascularly intact in all extremities with 2+ DTRs and 2+ pulses.  Lymph: Marshall lymphadenopathy of posterior or anterior cervical chain or axillae bilaterally.  Gait normal with good balance and coordination.  MSK: Mild arthritic changes of multiple joints Patient's neck exam does have some loss of lordosis.  Patient does have more pain with extension of the neck.  Symmetric Strength of the upper extremities bilaterally.  Marshall thenar eminence wasting noted.  Low back exam loss of lordosis, mild degenerative scoliosis.  Nontender on exam, negative straight leg test, 5 out of 5 strength of the lower extremities bilaterally and deep tendon reflexes intact.    Impression and Recommendations:     This case required medical decision making of moderate complexity. The above documentation has been reviewed and is accurate and complete Lyndal Pulley, DO       Note: This dictation was prepared with Dragon dictation along with smaller phrase technology. Any transcriptional errors that result from this process are unintentional.

## 2020-01-17 ENCOUNTER — Telehealth: Payer: Self-pay | Admitting: Physician Assistant

## 2020-01-17 ENCOUNTER — Telehealth: Payer: Self-pay | Admitting: Cardiovascular Disease

## 2020-01-17 ENCOUNTER — Ambulatory Visit (INDEPENDENT_AMBULATORY_CARE_PROVIDER_SITE_OTHER): Payer: Medicare Other | Admitting: Physician Assistant

## 2020-01-17 ENCOUNTER — Encounter: Payer: Self-pay | Admitting: Physician Assistant

## 2020-01-17 ENCOUNTER — Other Ambulatory Visit: Payer: Self-pay

## 2020-01-17 VITALS — BP 148/62 | HR 126 | Ht 74.0 in | Wt 213.0 lb

## 2020-01-17 DIAGNOSIS — I480 Paroxysmal atrial fibrillation: Secondary | ICD-10-CM

## 2020-01-17 DIAGNOSIS — R0609 Other forms of dyspnea: Secondary | ICD-10-CM

## 2020-01-17 DIAGNOSIS — R0600 Dyspnea, unspecified: Secondary | ICD-10-CM | POA: Diagnosis not present

## 2020-01-17 DIAGNOSIS — I25810 Atherosclerosis of coronary artery bypass graft(s) without angina pectoris: Secondary | ICD-10-CM

## 2020-01-17 DIAGNOSIS — I6523 Occlusion and stenosis of bilateral carotid arteries: Secondary | ICD-10-CM

## 2020-01-17 DIAGNOSIS — E785 Hyperlipidemia, unspecified: Secondary | ICD-10-CM

## 2020-01-17 DIAGNOSIS — I493 Ventricular premature depolarization: Secondary | ICD-10-CM | POA: Diagnosis not present

## 2020-01-17 DIAGNOSIS — N183 Chronic kidney disease, stage 3 unspecified: Secondary | ICD-10-CM | POA: Diagnosis not present

## 2020-01-17 MED ORDER — METOPROLOL TARTRATE 25 MG PO TABS: 25.0000 mg | ORAL_TABLET | Freq: Two times a day (BID) | ORAL | 0 refills | Status: DC

## 2020-01-17 NOTE — Telephone Encounter (Signed)
Mr. Massenburg complains of SOB on exertion for over 1 week. He states it feels like it did prior to having his stent placed. He specifically denies CP, nausea, dizziness.  He is not in acute distress and is asymptomatic if at rest. He is taking his medications as directed. He did not check his VS today, but he states his BP and HR has been "good."  His other complaint is sporadic numbness. Currently he has numbness in his face and arm. He has been worked up for stroke and was told the numbness is a result of his degenerative disc disease.   Scheduled the patient for evaluation with [name] today. He was grateful for assistance.

## 2020-01-17 NOTE — Telephone Encounter (Signed)
New message   Pt c/o Shortness Of Breath: STAT if SOB developed within the last 24 hours or pt is noticeably SOB on the phone  1. Are you currently SOB (can you hear that pt is SOB on the phone)?no   2. How long have you been experiencing SOB? Per patient has had sob for a week   3. Are you SOB when sitting or when up moving around? Moving around   4. Are you currently experiencing any other symptoms?numbness in arm and face

## 2020-01-17 NOTE — Telephone Encounter (Signed)
Will send call to primary card nurse

## 2020-01-17 NOTE — H&P (View-Only) (Signed)
Cardiology Office Note    Date:  01/17/2020   ID:  Culver, Feighner 02/16/1941, MRN [id number]  PCP:  [name], MD  Cardiologist: [name], MD EPS: Cristopher Peru, MD  No chief complaint on file.   History of Present Illness:  Thomas Marshall is a 79 y.o. male  with history of CAD status post CABG, symptomatic PVCs status post ablation 2018 with marked reduction in his PVCs from 25% down to 7%.  PAF on dofetilide, CAD because of weight loss Eliquis was reduced from 5 mg to 2.5 mg twice daily.  Also has carotid disease status post right carotid endarterectomy, DM, small AAA, hypertension, CKD stage III-IV and HLD.  Last cath 04/2017 showed patent LIMA to the LAD and RIMA to the RCA, patent SVG to OM1 with moderate eccentric lesion just distal to the graft insertion site, patent SVG to ramus intermediate with a critical lesion in the proximal body of the graft treated with successful PCI/DES using distal embolic protection  Last seen in our office 07/18/2019 and was doing well without angina and maintaining normal sinus rhythm.  Patient hospitalized in December with numbness and tingling of his left lip and arm.  CT of the brain revealed a new ganglia and external capsule region hypodensity thought to be likely chronic lacunar.  CTA of the head revealed a 80% stenosis of the left internal carotid artery, carotid Doppler showed less than 80% stenosis and is followed by vascular.  No new recommendations from neurology.  Was back in the ED 01/02/2020 with numbness of his left arm and leg face.  CT was negative labs normal MRI and cervical spine showed no acute stroke he did have chronic degenerative disc disease.  Patient added onto my schedule complaining of several weeks of dyspnea on exertion. No tightness in chest like prior angina but similar to when he had his stent 2018. Doesn't feel heart racing.   Past Medical History:  Diagnosis Date  . AAA (abdominal aortic aneurysm) ([place])   .  Arthritis   . Cancer (HCC)    skin & squamous cell  . Carotid artery disease ([place])    a. s/p R CEA 12/2015.  [name] CKD (chronic kidney disease), stage III    a. per historical labs.  . Coronary artery disease    a. s/p CABG 2004.  . Diabetes mellitus without complication ([place])   . GERD (gastroesophageal reflux disease)   . Hyperlipidemia   . Hypertension   . Medication intolerance Multiple   . PAF (paroxysmal atrial fibrillation) ([place])   . Pneumonia   . Sinus bradycardia    a. baseline HR 50s-60s, also h/o bradycardia on metoprolol and carvedilol    Past Surgical History:  Procedure Laterality Date  . ANKLE SURGERY     right fused  . BACK SURGERY     low back   . CHOLECYSTECTOMY N/A 12/07/2013   Procedure: LAPAROSCOPIC CHOLECYSTECTOMY ;  Surgeon: [name], MD;  Location: [place] [facility];  Service: General;  Laterality: N/A;  . COLONOSCOPY    . CORONARY ARTERY BYPASS GRAFT  2004   [place]  . CORONARY STENT INTERVENTION N/A 05/21/2017   Procedure: Coronary Stent Intervention;  Surgeon: [name], MD;  Location: [facility];  Service: Cardiovascular;  Laterality: N/A;  . ENDARTERECTOMY Right 12/10/2015   Procedure: Right Carotid ENDARTERECTOMY;  Surgeon: Conrad Brookville, MD;  Location: [facility];  Service: Vascular;  Laterality: Right;  . EXPLORATION POST  OPERATIVE OPEN HEART    . FINGER SURGERY     skin graft on rt index  . FINGER SURGERY Right    right index finger.  [name] HERNIA REPAIR Right    RIH  . JOINT REPLACEMENT    . KNEE SURGERY     replacement on both knees  . LEFT HEART CATH AND CORS/GRAFTS ANGIOGRAPHY N/A 05/21/2017   Procedure: Left Heart Cath and Cors/Grafts Angiography;  Surgeon: [name], MD;  Location: [facility];  Service: Cardiovascular;  Laterality: N/A;  . PROSTATE SURGERY    . PVC ABLATION N/A 10/27/2017   Procedure: PVC ABLATION;  Surgeon: Evans Lance, MD;  Location: [facility];  Service: Cardiovascular;  Laterality: N/A;    . TONSILLECTOMY    . URINARY SURGERY     scar tissue    Current Medications: Current Meds  Medication Sig  . amLODipine (NORVASC) 10 MG tablet Take 1 tablet (10 mg total) by mouth daily.  [name] apixaban (ELIQUIS) 2.5 MG TABS tablet Take 1 tablet (2.5 mg total) by mouth 2 (two) times daily. (0800 & 2000)  . aspirin EC 81 MG tablet Take 1 tablet (81 mg total) by mouth daily.  [name] atorvastatin (LIPITOR) 40 MG tablet Take 40 mg by mouth at bedtime.   . Cholecalciferol (VITAMIN D-3) 25 MCG (1000 UT) CAPS Take 1,000 Units by mouth at bedtime.  . cloNIDine (CATAPRES) 0.1 MG tablet Take 1 tablet (0.1 mg total) by mouth 2 (two) times daily.  . Cyanocobalamin 1000 MCG/ML KIT Inject 1,000 mcg as directed every 30 (thirty) days.  [name] dofetilide (TIKOSYN) 250 MCG capsule Take 1 capsule (250 mcg total) by mouth 2 (two) times daily.  [name] glucose blood test strip Accu-Check Guide Strips. Dx E11.9 Use to test blood glucose level 2 times a day.  . magnesium oxide (MAG-OX) 400 MG tablet Take 1 tablet (400 mg total) by mouth 4 (four) times daily.  . metFORMIN (GLUCOPHAGE) 1000 MG tablet TAKE 1 TABLET BY MOUTH TWICE DAILY WITH A MEAL  . Multiple Vitamins-Minerals (MULTIVITAMIN ADULT, MINERALS, PO) Take 1 tablet by mouth daily.  . nitroGLYCERIN (NITROSTAT) 0.4 MG SL tablet Place 1 tablet (0.4 mg total) under the tongue every 5 (five) minutes as needed for chest pain (x 3 doses).  . pantoprazole (PROTONIX) 40 MG tablet Take 1 tablet (40 mg total) by mouth daily.  . propranolol (INDERAL) 20 MG tablet Take one tablet by mouth PRN for irregular heartbeat.  Maximum 2 tablets in 24 hours.  [name] terazosin (HYTRIN) 1 MG capsule Take 1 mg by mouth at bedtime. (2100)  . vitamin C (ASCORBIC ACID) 500 MG tablet Take 1,000 mg by mouth daily with breakfast.      Allergies:   Lisinopril, Losartan, Nifedipine, Triamterene, Hydralazine hcl, Losartan potassium-hctz, Amoxicillin, Ampicillin, and Carvedilol   Social History    Socioeconomic History  . Marital status: Married    Spouse name: Not on file  . Number of children: 2  . Years of education: Not on file  . Highest education level: Not on file  Occupational History  . Occupation: retired  Tobacco Use  . Smoking status: Former Smoker    Packs/day: 1.00    Years: 13.00    Pack years: 13.00    Quit date: 11/30/1970    Years since quitting: 49.1  . Smokeless tobacco: Never Used  Substance and Sexual Activity  . Alcohol use: No    Alcohol/week: 0.0 standard drinks  .  Drug use: No  . Sexual activity: Yes  Other Topics Concern  . Not on file  Social History Narrative  . Not on file   Social Determinants of Health   Financial Resource Strain:   . Difficulty of Paying Living Expenses: Not on file  Food Insecurity:   . Worried About Charity fundraiser in the Last Year: Not on file  . Ran Out of Food in the Last Year: Not on file  Transportation Needs:   . Lack of Transportation (Medical): Not on file  . Lack of Transportation (Non-Medical): Not on file  Physical Activity: Sufficiently Active  . Days of Exercise per Week: 4 days  . Minutes of Exercise per Session: 40 min  Stress:   . Feeling of Stress : Not on file  Social Connections:   . Frequency of Communication with Friends and Family: Not on file  . Frequency of Social Gatherings with Friends and Family: Not on file  . Attends Religious Services: Not on file  . Active Member of Clubs or Organizations: Not on file  . Attends Archivist Meetings: Not on file  . Marital Status: Not on file     Family History:  The patient's family history includes Cancer in his sister; Diabetes in his brother and sister; Heart disease in his brother, father, and mother; Hyperlipidemia in his brother and father; Hypertension in his brother, father, and sister; Stroke in his mother.   ROS:   Please see the history of present illness.    ROS All other systems reviewed and are  negative.   PHYSICAL EXAM:   VS:  BP (!) 148/62   Pulse (!) 126   Ht '6\' 2"'$  (1.88 m)   Wt 213 lb (96.6 kg)   SpO2 99%   BMI 27.35 kg/m   Physical Exam  GEN: Well nourished, well developed, in no acute distress  Neck: no JVD, carotid bruits, or masses Cardiac:irreg irreg 1/6 systolic murmur LSB Respiratory:  clear to auscultation bilaterally, normal work of breathing GI: soft, nontender, nondistended, + BS Ext: without cyanosis, clubbing, or edema, Good distal pulses bilaterally Neuro:  Alert and Oriented x 3 Psych: euthymic mood, full affect  Wt Readings from Last 3 Encounters:  01/17/20 213 lb (96.6 kg)  01/09/20 214 lb (97.1 kg)  12/20/19 214 lb (97.1 kg)      Studies/Labs Reviewed:   EKG:  EKG is  ordered today.  The ekg ordered today demonstrates Afib 126/m with RBBB and LAFB  Recent Labs: 10/16/2019: TSH 2.27 11/20/2019: Magnesium 1.9 01/02/2020: ALT 14; BUN 32; Creatinine, Ser 1.73; Hemoglobin 10.7; Platelets 156; Potassium 4.5; Sodium 137   Lipid Panel    Component Value Date/Time   CHOL 114 11/21/2019 0610   TRIG 69 11/21/2019 0610   HDL 38 (L) 11/21/2019 0610   CHOLHDL 3.0 11/21/2019 0610   VLDL 14 11/21/2019 0610   LDLCALC 62 11/21/2019 0610   LDLDIRECT 67.0 09/16/2016 1455    Additional studies/ records that were reviewed today include:  Echocardiogram: 08/2017 Left ventricle: The cavity size was normal. There was moderate   focal basal hypertrophy of the septum with otherwise moderate   concentric hypertrophy. Systolic function was normal. The   estimated ejection fraction was in the range of 55% to 60%. Wall   motion was normal; there were no regional wall motion   abnormalities. The study is not technically sufficient to allow   evaluation of LV diastolic function. - Aortic valve: Trileaflet;  severely thickened, severely calcified   leaflets. The non-coronary cusp is most affected. Noncoronary   cusp mobility was severely restricted. Valve area  (VTI): 2.39   cm^2. Valve area (Vmax): 2.36 cm^2. Valve area (Vmean): 1.85   cm^2. - Mitral valve: Moderately calcified annulus. Transvalvular   velocity was within the normal range. There was no evidence for   stenosis. There was mild regurgitation. - Left atrium: The atrium was moderately dilated. - Right ventricle: The cavity size was normal. Wall thickness was   normal. Systolic function was normal. - Atrial septum: No defect or patent foramen ovale was identified   by color flow Doppler. - Tricuspid valve: There was trivial regurgitation. - Pulmonary arteries: Systolic pressure was mildly increased. PA   peak pressure: 41 mm Hg (S).   ------------------------------------------------------------------- Labs, prior tests, procedures, and surgery: Coronary artery bypass grafting.   ------------------------------------------------------------------- Study data:  Comparison was made to the study of 05/27/2016.  Study status:  Routine.  Procedure:  The patient reported no pain pre or post test. Transthoracic echocardiography. Image quality was adequate.  Study completion:  There were no complications. Transthoracic echocardiography.  M-mode, complete 2D, spectral Doppler, and color Doppler.  Birthdate:  Patient birthdate: Jul 10, 1941.  Age:  Patient is 79 yr old.  Sex:  Gender: male. BMI: 28.5 kg/m^2.  Blood pressure:     134/62  Patient status: Outpatient.  Study date:  Study date: 09/29/2017. Study time: 09:26 AM.  Location:  [facility]   -------------------------------------------------------------------     Coronary Stent Intervention   04/2017  Left Heart Cath and Cors/Grafts Angiography  Conclusion   1. Severe native coronary artery disease with total occlusion of the left main and total occlusion of the RCA 2. Status post aortocoronary bypass surgery with continued patency of the LIMA to LAD and RIMA to RCA, patency of the saphenous vein graft to OM1 with a moderate  eccentric lesion just distal to the graft insertion site, and patency of the saphenous vein graft to ramus intermedius with a critical lesion in the proximal body the graft treated successfully with PCI using distal embolic protection and a 4.0 mm drug-eluting stent   Recommendations: The patient will be hydrated for 6 hours. He will be discharged this evening with follow-up labs next week. Recommend aspirin, Plavix, and Eliquis 1 month, then discontinue aspirin. He should start back on Eliquis tomorrow evening.     Carotid dopplers 11/21/19 Summary:  Right Carotid: Velocities in the right ICA are consistent with a 1-39%  stenosis.   Left Carotid: Velocities in the left ICA are consistent with a low end  60-79%               stenosis. The ECA appears >50% stenosed.   Vertebrals: Bilateral vertebral arteries demonstrate antegrade flow.     ASSESSMENT:    1. Paroxysmal atrial fibrillation (HCC)   2. Dyspnea on exertion   3. Coronary artery disease involving coronary bypass graft of native heart without angina pectoris   4. PVC (premature ventricular contraction)   5. Bilateral carotid artery stenosis   6. Stage 3 chronic kidney disease, unspecified whether stage 3a or 3b CKD   7. Hyperlipidemia, unspecified hyperlipidemia type      PLAN:  In order of problems listed above:  Dyspnea on exertion for 2 weeks. In rapid Afib at 126/m. Was in NSR 01/03/20.Symptoms similar to when he had his stent 2 yrs ago but no chest pain. Hasn't missed any Tikosyn doses.  Chart indicates slow HR on carvedilol in the past. Discussed with [name] who recommends cardioversion. Patient hasn't missed any eliquis or Tikosyn. Will give metoprolol 25 mg bid to help slow him down while waiting for DCCV.   PAF on dofetilide and Eliquis 2.5 mg twice daily now in rapid afib see above   CAD status post CABG last cath 2018 with DES to the SVG to ramus intermediate see cath report above for details-no angina    Symptomatic PVCs status post ablation in 2018 by [name] with marked reduction in PVC load from 25% down to 7%  Carotid artery disease status post right carotid endarterectomy 28-00% LICA on [place] 34/91/79 followed by [facility]  CKD stage III-IV Crt 1.73 01/02/20  Hyperlipidemia LDL 62 10/2019          Medication Adjustments/Labs and Tests Ordered: Current medicines are reviewed at length with the patient today.  Concerns regarding medicines are outlined above.  Medication changes, Labs and Tests ordered today are listed in the Patient Instructions below. There are no Patient Instructions on file for this visit.   Thomas Boast, PA-C  01/17/2020 2:48 PM    [facility] [address], [place], [place]  [id number] Phone: [phone]; Fax: [phone]

## 2020-01-17 NOTE — Telephone Encounter (Signed)
New Message:   Please call, concerning patient's allergies to Metoprolol.

## 2020-01-17 NOTE — Progress Notes (Signed)
Cardiology Office Note    Date:  01/17/2020   ID:  Culver, Feighner 02/16/1941, MRN [id number]  PCP:  [name], MD  Cardiologist: [name], MD EPS: Cristopher Peru, MD  No chief complaint on file.   History of Present Illness:  Thomas Marshall is a 79 y.o. male  with history of CAD status post CABG, symptomatic PVCs status post ablation 2018 with marked reduction in his PVCs from 25% down to 7%.  PAF on dofetilide, CAD because of weight loss Eliquis was reduced from 5 mg to 2.5 mg twice daily.  Also has carotid disease status post right carotid endarterectomy, DM, small AAA, hypertension, CKD stage III-IV and HLD.  Last cath 04/2017 showed patent LIMA to the LAD and RIMA to the RCA, patent SVG to OM1 with moderate eccentric lesion just distal to the graft insertion site, patent SVG to ramus intermediate with a critical lesion in the proximal body of the graft treated with successful PCI/DES using distal embolic protection  Last seen in our office 07/18/2019 and was doing well without angina and maintaining normal sinus rhythm.  Patient hospitalized in December with numbness and tingling of his left lip and arm.  CT of the brain revealed a new ganglia and external capsule region hypodensity thought to be likely chronic lacunar.  CTA of the head revealed a 80% stenosis of the left internal carotid artery, carotid Doppler showed less than 80% stenosis and is followed by vascular.  No new recommendations from neurology.  Was back in the ED 01/02/2020 with numbness of his left arm and leg face.  CT was negative labs normal MRI and cervical spine showed no acute stroke he did have chronic degenerative disc disease.  Patient added onto my schedule complaining of several weeks of dyspnea on exertion. No tightness in chest like prior angina but similar to when he had his stent 2018. Doesn't feel heart racing.   Past Medical History:  Diagnosis Date  . AAA (abdominal aortic aneurysm) ([place])   .  Arthritis   . Cancer (HCC)    skin & squamous cell  . Carotid artery disease ([place])    a. s/p R CEA 12/2015.  [name] CKD (chronic kidney disease), stage III    a. per historical labs.  . Coronary artery disease    a. s/p CABG 2004.  . Diabetes mellitus without complication ([place])   . GERD (gastroesophageal reflux disease)   . Hyperlipidemia   . Hypertension   . Medication intolerance Multiple   . PAF (paroxysmal atrial fibrillation) ([place])   . Pneumonia   . Sinus bradycardia    a. baseline HR 50s-60s, also h/o bradycardia on metoprolol and carvedilol    Past Surgical History:  Procedure Laterality Date  . ANKLE SURGERY     right fused  . BACK SURGERY     low back   . CHOLECYSTECTOMY N/A 12/07/2013   Procedure: LAPAROSCOPIC CHOLECYSTECTOMY ;  Surgeon: [name], MD;  Location: [facility];  Service: General;  Laterality: N/A;  . COLONOSCOPY    . CORONARY ARTERY BYPASS GRAFT  2004   [place]  . CORONARY STENT INTERVENTION N/A 05/21/2017   Procedure: Coronary Stent Intervention;  Surgeon: [name], MD;  Location: [facility];  Service: Cardiovascular;  Laterality: N/A;  . ENDARTERECTOMY Right 12/10/2015   Procedure: Right Carotid ENDARTERECTOMY;  Surgeon: Conrad McConnelsville, MD;  Location: [facility];  Service: Vascular;  Laterality: Right;  . EXPLORATION POST  OPERATIVE OPEN HEART    . FINGER SURGERY     skin graft on rt index  . FINGER SURGERY Right    right index finger.  [name] HERNIA REPAIR Right    RIH  . JOINT REPLACEMENT    . KNEE SURGERY     replacement on both knees  . LEFT HEART CATH AND CORS/GRAFTS ANGIOGRAPHY N/A 05/21/2017   Procedure: Left Heart Cath and Cors/Grafts Angiography;  Surgeon: [name], MD;  Location: [facility];  Service: Cardiovascular;  Laterality: N/A;  . PROSTATE SURGERY    . PVC ABLATION N/A 10/27/2017   Procedure: PVC ABLATION;  Surgeon: Evans Lance, MD;  Location: [facility];  Service: Cardiovascular;  Laterality: N/A;   . TONSILLECTOMY    . URINARY SURGERY     scar tissue    Current Medications: Current Meds  Medication Sig  . amLODipine (NORVASC) 10 MG tablet Take 1 tablet (10 mg total) by mouth daily.  [name] apixaban (ELIQUIS) 2.5 MG TABS tablet Take 1 tablet (2.5 mg total) by mouth 2 (two) times daily. (0800 & 2000)  . aspirin EC 81 MG tablet Take 1 tablet (81 mg total) by mouth daily.  [name] atorvastatin (LIPITOR) 40 MG tablet Take 40 mg by mouth at bedtime.   . Cholecalciferol (VITAMIN D-3) 25 MCG (1000 UT) CAPS Take 1,000 Units by mouth at bedtime.  . cloNIDine (CATAPRES) 0.1 MG tablet Take 1 tablet (0.1 mg total) by mouth 2 (two) times daily.  . Cyanocobalamin 1000 MCG/ML KIT Inject 1,000 mcg as directed every 30 (thirty) days.  [name] dofetilide (TIKOSYN) 250 MCG capsule Take 1 capsule (250 mcg total) by mouth 2 (two) times daily.  [name] glucose blood test strip Accu-Check Guide Strips. Dx E11.9 Use to test blood glucose level 2 times a day.  . magnesium oxide (MAG-OX) 400 MG tablet Take 1 tablet (400 mg total) by mouth 4 (four) times daily.  . metFORMIN (GLUCOPHAGE) 1000 MG tablet TAKE 1 TABLET BY MOUTH TWICE DAILY WITH A MEAL  . Multiple Vitamins-Minerals (MULTIVITAMIN ADULT, MINERALS, PO) Take 1 tablet by mouth daily.  . nitroGLYCERIN (NITROSTAT) 0.4 MG SL tablet Place 1 tablet (0.4 mg total) under the tongue every 5 (five) minutes as needed for chest pain (x 3 doses).  . pantoprazole (PROTONIX) 40 MG tablet Take 1 tablet (40 mg total) by mouth daily.  . propranolol (INDERAL) 20 MG tablet Take one tablet by mouth PRN for irregular heartbeat.  Maximum 2 tablets in 24 hours.  [name] terazosin (HYTRIN) 1 MG capsule Take 1 mg by mouth at bedtime. (2100)  . vitamin C (ASCORBIC ACID) 500 MG tablet Take 1,000 mg by mouth daily with breakfast.      Allergies:   Lisinopril, Losartan, Nifedipine, Triamterene, Hydralazine hcl, Losartan potassium-hctz, Amoxicillin, Ampicillin, and Carvedilol   Social History    Socioeconomic History  . Marital status: Married    Spouse name: Not on file  . Number of children: 2  . Years of education: Not on file  . Highest education level: Not on file  Occupational History  . Occupation: retired  Tobacco Use  . Smoking status: Former Smoker    Packs/day: 1.00    Years: 13.00    Pack years: 13.00    Quit date: 11/30/1970    Years since quitting: 49.1  . Smokeless tobacco: Never Used  Substance and Sexual Activity  . Alcohol use: No    Alcohol/week: 0.0 standard drinks  . Drug  use: No  . Sexual activity: Yes  Other Topics Concern  . Not on file  Social History Narrative  . Not on file   Social Determinants of Health   Financial Resource Strain:   . Difficulty of Paying Living Expenses: Not on file  Food Insecurity:   . Worried About Charity fundraiser in the Last Year: Not on file  . Ran Out of Food in the Last Year: Not on file  Transportation Needs:   . Lack of Transportation (Medical): Not on file  . Lack of Transportation (Non-Medical): Not on file  Physical Activity: Sufficiently Active  . Days of Exercise per Week: 4 days  . Minutes of Exercise per Session: 40 min  Stress:   . Feeling of Stress : Not on file  Social Connections:   . Frequency of Communication with Friends and Family: Not on file  . Frequency of Social Gatherings with Friends and Family: Not on file  . Attends Religious Services: Not on file  . Active Member of Clubs or Organizations: Not on file  . Attends Archivist Meetings: Not on file  . Marital Status: Not on file     Family History:  The patient's family history includes Cancer in his sister; Diabetes in his brother and sister; Heart disease in his brother, father, and mother; Hyperlipidemia in his brother and father; Hypertension in his brother, father, and sister; Stroke in his mother.   ROS:   Please see the history of present illness.    ROS All other systems reviewed and are  negative.   PHYSICAL EXAM:   VS:  BP (!) 148/62   Pulse (!) 126   Ht _0  (1.88 m)   Wt 213 lb (96.6 kg)   SpO2 99%   BMI 27.35 kg/m   Physical Exam  GEN: Well nourished, well developed, in no acute distress  Neck: no JVD, carotid bruits, or masses Cardiac:irreg irreg 1/6 systolic murmur LSB Respiratory:  clear to auscultation bilaterally, normal work of breathing GI: soft, nontender, nondistended, + BS Ext: without cyanosis, clubbing, or edema, Good distal pulses bilaterally Neuro:  Alert and Oriented x 3 Psych: euthymic mood, full affect  Wt Readings from Last 3 Encounters:  01/17/20 213 lb (96.6 kg)  01/09/20 214 lb (97.1 kg)  12/20/19 214 lb (97.1 kg)      Studies/Labs Reviewed:   EKG:  EKG is  ordered today.  The ekg ordered today demonstrates Afib 126/m with RBBB and LAFB  Recent Labs: 10/16/2019: TSH 2.27 11/20/2019: Magnesium 1.9 01/02/2020: ALT 14; BUN 32; Creatinine, Ser 1.73; Hemoglobin 10.7; Platelets 156; Potassium 4.5; Sodium 137   Lipid Panel    Component Value Date/Time   CHOL 114 11/21/2019 0610   TRIG 69 11/21/2019 0610   HDL 38 (L) 11/21/2019 0610   CHOLHDL 3.0 11/21/2019 0610   VLDL 14 11/21/2019 0610   LDLCALC 62 11/21/2019 0610   LDLDIRECT 67.0 09/16/2016 1455    Additional studies/ records that were reviewed today include:  Echocardiogram: 08/2017 Left ventricle: The cavity size was normal. There was moderate   focal basal hypertrophy of the septum with otherwise moderate   concentric hypertrophy. Systolic function was normal. The   estimated ejection fraction was in the range of 55% to 60%. Wall   motion was normal; there were no regional wall motion   abnormalities. The study is not technically sufficient to allow   evaluation of LV diastolic function. - Aortic valve: Trileaflet; severely  thickened, severely calcified   leaflets. The non-coronary cusp is most affected. Noncoronary   cusp mobility was severely restricted. Valve area  (VTI): 2.39   cm^2. Valve area (Vmax): 2.36 cm^2. Valve area (Vmean): 1.85   cm^2. - Mitral valve: Moderately calcified annulus. Transvalvular   velocity was within the normal range. There was no evidence for   stenosis. There was mild regurgitation. - Left atrium: The atrium was moderately dilated. - Right ventricle: The cavity size was normal. Wall thickness was   normal. Systolic function was normal. - Atrial septum: No defect or patent foramen ovale was identified   by color flow Doppler. - Tricuspid valve: There was trivial regurgitation. - Pulmonary arteries: Systolic pressure was mildly increased. PA   peak pressure: 41 mm Hg (S).   ------------------------------------------------------------------- Labs, prior tests, procedures, and surgery: Coronary artery bypass grafting.   ------------------------------------------------------------------- Study data:  Comparison was made to the study of 05/27/2016.  Study status:  Routine.  Procedure:  The patient reported no pain pre or post test. Transthoracic echocardiography. Image quality was adequate.  Study completion:  There were no complications. Transthoracic echocardiography.  M-mode, complete 2D, spectral Doppler, and color Doppler.  Birthdate:  Patient birthdate: 03-Dec-1940.  Age:  Patient is 79 yr old.  Sex:  Gender: male. BMI: 28.5 kg/m^2.  Blood pressure:     134/62  Patient status: Outpatient.  Study date:  Study date: 09/29/2017. Study time: 09:26 AM.  Location:  [facility]   -------------------------------------------------------------------     Coronary Stent Intervention   04/2017  Left Heart Cath and Cors/Grafts Angiography  Conclusion   1. Severe native coronary artery disease with total occlusion of the left main and total occlusion of the RCA 2. Status post aortocoronary bypass surgery with continued patency of the LIMA to LAD and RIMA to RCA, patency of the saphenous vein graft to OM1 with a moderate  eccentric lesion just distal to the graft insertion site, and patency of the saphenous vein graft to ramus intermedius with a critical lesion in the proximal body the graft treated successfully with PCI using distal embolic protection and a 4.0 mm drug-eluting stent   Recommendations: The patient will be hydrated for 6 hours. He will be discharged this evening with follow-up labs next week. Recommend aspirin, Plavix, and Eliquis 1 month, then discontinue aspirin. He should start back on Eliquis tomorrow evening.     Carotid dopplers 11/21/19 Summary:  Right Carotid: Velocities in the right ICA are consistent with a 1-39%  stenosis.   Left Carotid: Velocities in the left ICA are consistent with a low end  60-79%               stenosis. The ECA appears >50% stenosed.   Vertebrals: Bilateral vertebral arteries demonstrate antegrade flow.     ASSESSMENT:    1. Paroxysmal atrial fibrillation (HCC)   2. Dyspnea on exertion   3. Coronary artery disease involving coronary bypass graft of native heart without angina pectoris   4. PVC (premature ventricular contraction)   5. Bilateral carotid artery stenosis   6. Stage 3 chronic kidney disease, unspecified whether stage 3a or 3b CKD   7. Hyperlipidemia, unspecified hyperlipidemia type      PLAN:  In order of problems listed above:  Dyspnea on exertion for 2 weeks. In rapid Afib at 126/m. Was in NSR 01/03/20.Symptoms similar to when he had his stent 2 yrs ago but no chest pain. Hasn't missed any Tikosyn doses. Chart  indicates slow HR on carvedilol in the past. Discussed with [name] who recommends cardioversion. Patient hasn't missed any eliquis or Tikosyn. Will give metoprolol 25 mg bid to help slow him down while waiting for DCCV.   PAF on dofetilide and Eliquis 2.5 mg twice daily now in rapid afib see above   CAD status post CABG last cath 2018 with DES to the SVG to ramus intermediate see cath report above for details-no angina    Symptomatic PVCs status post ablation in 2018 by [name] with marked reduction in PVC load from 25% down to 7%  Carotid artery disease status post right carotid endarterectomy 54-36% LICA on [place] 06/77/03 followed by [facility]  CKD stage III-IV Crt 1.73 01/02/20  Hyperlipidemia LDL 62 10/2019          Medication Adjustments/Labs and Tests Ordered: Current medicines are reviewed at length with the patient today.  Concerns regarding medicines are outlined above.  Medication changes, Labs and Tests ordered today are listed in the Patient Instructions below. There are no Patient Instructions on file for this visit.   Sumner Boast, PA-C  01/17/2020 2:48 PM    [facility] [place], [place], [place]  [address] Phone: [phone]; Fax: [phone]

## 2020-01-17 NOTE — Patient Instructions (Signed)
Medication Instructions:  Your physician has recommended you make the following change in your medication:   1. START: metoprolol tartrate (lopressor) 25 mg tablet by mouth twice a day  2. STOP: propranolol (inderal)  *If you need a refill on your cardiac medications before your next appointment, please call your pharmacy*  Lab Work: TODAY: BMET, CBC  If you have labs (blood work) drawn today and your tests are completely normal, you will receive your results only by: [name] MyChart Message (if you have MyChart) OR . A paper copy in the mail If you have any lab test that is abnormal or we need to change your treatment, we will call you to review the results.  Testing/Procedures: Your physician has recommended that you have a Cardioversion (DCCV) on 01/22/20. Electrical Cardioversion uses a jolt of electricity to your heart either through paddles or wired patches attached to your chest. This is a controlled, usually prescheduled, procedure. Defibrillation is done under light anesthesia in the hospital, and you usually go home the day of the procedure. This is done to get your heart back into a normal rhythm. You are not awake for the procedure. Please see the instruction sheet given to you today.  Follow-Up: Follow up with Richardson Dopp, PA on 02/13/20 at 3:45 PM  Other Instructions    You are scheduled for a Cardioversion on 01/22/20 with Dr. Johnsie Cancel.  Please arrive at the [facility] (Main Entrance A) at [facility]: [address] [place], [place] [address] at 9:30 AM. (1 hour prior to procedure unless lab work is needed; if lab work is needed arrive 1.5 hours ahead)  DIET: Nothing to eat or drink after midnight except a sip of water with medications (see medication instructions below)  Medication Instructions: Hold metformin the morning of the procedure  Continue your anticoagulant: Eliquis You will need to continue your anticoagulant after your procedure until you are told by  your Provider that it is safe to stop   Labs: TODAY: CBC, BMET  Your Pre-procedure COVID-19 Testing will be done on 01/19/20 at 1:20 PM at [facility] at [address], [place], [place] [address]. Once you arrive at the testing site, stay in the right hand lane, go under the building overhang not the tent. If you are tested under the tent your results may not be back before your procedure. Please be on time for your appointment.  After your swab you will be given a mask to wear and instructed to go home and quarantine/no visitors until after your procedure. If you test positive you will be notified and your procedure will be cancelled.   You must have a responsible person to drive you home and stay in the waiting area during your procedure. Failure to do so could result in cancellation.  Bring your insurance cards.  *Special Note: Every effort is made to have your procedure done on time. Occasionally there are emergencies that occur at the hospital that may cause delays. Please be patient if a delay does occur.

## 2020-01-17 NOTE — Telephone Encounter (Signed)
Called and spoke to [place] at [facility] and let her know that the patient had bradycardia with carvedilol while in NSR. Made her aware that the patient is in Rapid Afib and [name], [place] discussed with [name] and patient to start metoprolol tartrate 25 mg BID until we are able to get him cardioverted on Monday. If he converts to SR and has bradycardia they will determine if he needs to stop the metoprolol. She verbalized understanding and states that she will fill the Rx. Called and made patient aware. ER precautions reviewed with the patient.

## 2020-01-17 NOTE — Telephone Encounter (Signed)
Called and spoke to [place] at [facility] and let her know that the patient had bradycardia with carvedilol while in NSR. Made her aware that the patient is in Rapid Afib and [name], [place] discussed with [name] and patient to start metoprolol tartrate 25 mg BID until we are able to get him cardioverted on Monday. If he converts to SR and has bradycardia they will determine if he needs to stop the metoprolol. She verbalized understanding and states that she will fill the Rx. Called and made patient aware. ER precautions reviewed with the patient.

## 2020-01-17 NOTE — Telephone Encounter (Signed)
Pt c/o medication issue:  1. Name of Medication: metoprolol tartrate (LOPRESSOR) 25 MG tablet  2. How are you currently taking this medication (dosage and times per day)?   3. Are you having a reaction (difficulty breathing--STAT)? No  4. What is your medication issue? Patient states he was unable to pick up medication from the pharmacy. Patient states the pharmacy will not distribute because the patient has a metoprolol tartrate (LOPRESSOR) 25 MG tablet medication allergy. Please call to advise.

## 2020-01-18 LAB — CBC
Hematocrit: 33.9 % — ABNORMAL LOW (ref 37.5–51.0)
Hemoglobin: 10.9 g/dL — ABNORMAL LOW (ref 13.0–17.7)
MCH: 28.9 pg (ref 26.6–33.0)
MCHC: 32.2 g/dL (ref 31.5–35.7)
MCV: 90 fL (ref 79–97)
Platelets: 152 10*3/uL (ref 150–450)
RBC: 3.77 x10E6/uL — ABNORMAL LOW (ref 4.14–5.80)
RDW: 14.7 % (ref 11.6–15.4)
WBC: 6.2 10*3/uL (ref 3.4–10.8)

## 2020-01-18 LAB — BASIC METABOLIC PANEL
BUN/Creatinine Ratio: 17 (ref 10–24)
BUN: 34 mg/dL — ABNORMAL HIGH (ref 8–27)
CO2: 21 mmol/L (ref 20–29)
Calcium: 9.1 mg/dL (ref 8.6–10.2)
Chloride: 102 mmol/L (ref 96–106)
Creatinine, Ser: 1.98 mg/dL — ABNORMAL HIGH (ref 0.76–1.27)
GFR calc Af Amer: 36 mL/min/{1.73_m2} — ABNORMAL LOW (ref >59)
GFR calc non Af Amer: 31 mL/min/{1.73_m2} — ABNORMAL LOW (ref >59)
Glucose: 96 mg/dL (ref 65–99)
Potassium: 4.7 mmol/L (ref 3.5–5.2)
Sodium: 139 mmol/L (ref 134–144)

## 2020-01-19 ENCOUNTER — Telehealth: Payer: Self-pay | Admitting: Cardiovascular Disease

## 2020-01-19 ENCOUNTER — Other Ambulatory Visit (HOSPITAL_COMMUNITY)
Admission: RE | Admit: 2020-01-19 | Discharge: 2020-01-19 | Disposition: A | Discharge status: Home / Self Care | Payer: Medicare Other | Source: Ambulatory Visit | Attending: Cardiovascular Disease | Admitting: Cardiovascular Disease

## 2020-01-19 DIAGNOSIS — Z01812 Encounter for preprocedural laboratory examination: Secondary | ICD-10-CM | POA: Insufficient documentation

## 2020-01-19 DIAGNOSIS — Z20822 Contact with and (suspected) exposure to covid-19: Secondary | ICD-10-CM | POA: Insufficient documentation

## 2020-01-19 LAB — SARS CORONAVIRUS 2 (TAT 6-24 HRS): SARS Coronavirus 2: NEGATIVE

## 2020-01-19 NOTE — Telephone Encounter (Signed)
Spoke with pt and advised that is not unusual to get numbers all over the place when in Afib because the machines can't properly pick up an accurate rate.  Pt denies any symptoms.  Advised if any symptoms develop over the week, head to ER for eval, otherwise plan for DCCV as scheduled on Monday.  Pt verbalized understanding and was appreciative for call.

## 2020-01-19 NOTE — Telephone Encounter (Signed)
  STAT if HR is under 50 or over 120 (normal HR is 60-100 beats per minute)  1) What is your heart rate?  57,38, 40, 60, 80  2) Do you have a log of your heart rate readings (document readings)? yes  3) Do you have any other symptoms? No symptoms to report. Patient states that his HR is going up and down. He has a cardioversion scheduled for 01/22/20 but wants to know if it is normal for his HR to go up and down with his heart out of rythym. Patient is on his way right now to get his covid test but would like a call back.

## 2020-01-22 ENCOUNTER — Other Ambulatory Visit: Payer: Self-pay

## 2020-01-22 ENCOUNTER — Ambulatory Visit: Payer: Medicare Other

## 2020-01-22 ENCOUNTER — Encounter (HOSPITAL_COMMUNITY): Payer: Self-pay | Admitting: Cardiovascular Disease

## 2020-01-22 ENCOUNTER — Encounter (HOSPITAL_COMMUNITY)
Admission: RE | Disposition: A | Discharge status: Home / Self Care | Payer: Self-pay | Source: Home / Self Care | Attending: Cardiovascular Disease

## 2020-01-22 ENCOUNTER — Ambulatory Visit (HOSPITAL_COMMUNITY)
Admission: RE | Admit: 2020-01-22 | Discharge: 2020-01-22 | Disposition: A | Discharge status: Home / Self Care | Payer: Medicare Other | Attending: Cardiovascular Disease | Admitting: Cardiovascular Disease

## 2020-01-22 DIAGNOSIS — I714 Abdominal aortic aneurysm, without rupture: Secondary | ICD-10-CM | POA: Insufficient documentation

## 2020-01-22 DIAGNOSIS — R0609 Other forms of dyspnea: Secondary | ICD-10-CM | POA: Diagnosis not present

## 2020-01-22 DIAGNOSIS — N183 Chronic kidney disease, stage 3 unspecified: Secondary | ICD-10-CM | POA: Insufficient documentation

## 2020-01-22 DIAGNOSIS — Z888 Allergy status to other drugs, medicaments and biological substances status: Secondary | ICD-10-CM | POA: Diagnosis not present

## 2020-01-22 DIAGNOSIS — Z7982 Long term (current) use of aspirin: Secondary | ICD-10-CM | POA: Insufficient documentation

## 2020-01-22 DIAGNOSIS — I480 Paroxysmal atrial fibrillation: Secondary | ICD-10-CM | POA: Insufficient documentation

## 2020-01-22 DIAGNOSIS — R001 Bradycardia, unspecified: Secondary | ICD-10-CM | POA: Diagnosis not present

## 2020-01-22 DIAGNOSIS — Z7901 Long term (current) use of anticoagulants: Secondary | ICD-10-CM | POA: Diagnosis not present

## 2020-01-22 DIAGNOSIS — Z87891 Personal history of nicotine dependence: Secondary | ICD-10-CM | POA: Diagnosis not present

## 2020-01-22 DIAGNOSIS — Z880 Allergy status to penicillin: Secondary | ICD-10-CM | POA: Diagnosis not present

## 2020-01-22 DIAGNOSIS — E1122 Type 2 diabetes mellitus with diabetic chronic kidney disease: Secondary | ICD-10-CM | POA: Diagnosis not present

## 2020-01-22 DIAGNOSIS — Z79899 Other long term (current) drug therapy: Secondary | ICD-10-CM | POA: Diagnosis not present

## 2020-01-22 DIAGNOSIS — I129 Hypertensive chronic kidney disease with stage 1 through stage 4 chronic kidney disease, or unspecified chronic kidney disease: Secondary | ICD-10-CM | POA: Diagnosis not present

## 2020-01-22 DIAGNOSIS — Z539 Procedure and treatment not carried out, unspecified reason: Secondary | ICD-10-CM | POA: Diagnosis not present

## 2020-01-22 DIAGNOSIS — Z8249 Family history of ischemic heart disease and other diseases of the circulatory system: Secondary | ICD-10-CM | POA: Insufficient documentation

## 2020-01-22 DIAGNOSIS — Z7984 Long term (current) use of oral hypoglycemic drugs: Secondary | ICD-10-CM | POA: Insufficient documentation

## 2020-01-22 DIAGNOSIS — E785 Hyperlipidemia, unspecified: Secondary | ICD-10-CM | POA: Insufficient documentation

## 2020-01-22 DIAGNOSIS — I25810 Atherosclerosis of coronary artery bypass graft(s) without angina pectoris: Secondary | ICD-10-CM | POA: Insufficient documentation

## 2020-01-22 DIAGNOSIS — K219 Gastro-esophageal reflux disease without esophagitis: Secondary | ICD-10-CM | POA: Diagnosis not present

## 2020-01-22 DIAGNOSIS — Z951 Presence of aortocoronary bypass graft: Secondary | ICD-10-CM | POA: Diagnosis not present

## 2020-01-22 DIAGNOSIS — I6523 Occlusion and stenosis of bilateral carotid arteries: Secondary | ICD-10-CM | POA: Insufficient documentation

## 2020-01-22 LAB — GLUCOSE, CAPILLARY: Glucose-Capillary: 107 mg/dL — ABNORMAL HIGH (ref 70–99)

## 2020-01-22 SURGERY — CANCELLED PROCEDURE

## 2020-01-22 MED ORDER — METOPROLOL TARTRATE 25 MG PO TABS: 12.5000 mg | ORAL_TABLET | Freq: Two times a day (BID) | ORAL | 0 refills | Status: DC

## 2020-01-22 MED ORDER — HYDROCORTISONE 1 % EX CREA: 1.0000 "application " | TOPICAL_CREAM | Freq: Three times a day (TID) | CUTANEOUS | Status: DC | PRN

## 2020-01-22 NOTE — Progress Notes (Signed)
Pt in normal sinus rhythm confirmed by 12 lead EKG, Dr. Johnsie Cancel reviewed. Pt's cardioversion cancelled today. Jobe Igo, RN

## 2020-01-22 NOTE — Interval H&P Note (Signed)
History and Physical Interval Note:  01/22/2020 8:48 AM  Thomas Marshall  has presented today for surgery, with the diagnosis of AFIB.  The various methods of treatment have been discussed with the patient and family. After consideration of risks, benefits and other options for treatment, the patient has consented to  Procedure(s): CARDIOVERSION (N/A) as a surgical intervention.  The patient's history has been reviewed, patient examined, no change in status, stable for surgery.  I have reviewed the patient's chart and labs.  Questions were answered to the patient's satisfaction.     Jenkins Rouge

## 2020-01-22 NOTE — Progress Notes (Signed)
Called and spoke to patient. Appointment moved up to see Richardson Dopp, PA on 3/2 at 3:15 PM. Patient states that he is still having some SOB, but is otherwise feeling okay. Patient does have a Hx of bradycardia while on BB and plan was to d/c BB once back in SR. Patient states that his pulse at home has been around 55 bpm. EKG done today at the hospital showed 44 bpm. Discussed with Truitt Merle, NP and patient will decrease his metoprolol to 12.5 mg BID. Made patient aware. Patient verbalized understanding and will continue to monitor and let us know if his Sx change or worsen.

## 2020-01-22 NOTE — Progress Notes (Signed)
Covering for [name], PA   Is there a way to get this patient back in for further evaluation?   Looks to be seeing Nicki Reaper next month.   Cecille Rubin

## 2020-01-24 ENCOUNTER — Telehealth: Payer: Self-pay | Admitting: Cardiovascular Disease

## 2020-01-24 ENCOUNTER — Ambulatory Visit: Payer: Medicare Other

## 2020-01-24 ENCOUNTER — Other Ambulatory Visit: Payer: Self-pay | Admitting: Internal Medicine

## 2020-01-24 DIAGNOSIS — E1122 Type 2 diabetes mellitus with diabetic chronic kidney disease: Secondary | ICD-10-CM

## 2020-01-24 NOTE — Telephone Encounter (Signed)
Reviewed for Thomas Barrios, PA  Why don't we see if we can get an event monitor for him and see how much PAF he is actually having.   Agree with keeping OV next week.   Cecille Rubin

## 2020-01-24 NOTE — Telephone Encounter (Signed)
Called and spoke to patient. He states that he thinks that he is back in Afib. Hr fluctuating between 80-115 bpm. Patient states that he feels SOB again. Discussed with Truitt Merle, NP and we will have patient seen in the [facility] Clinic.    Called and spoke to patient and made him aware that we have made an appointment in the [facility] tomorrow 2/25 at 10:00 AM with Adline Peals, PA. Made patient aware that I will cancel his appointment with Richardson Dopp, PA on 3/2.

## 2020-01-24 NOTE — Telephone Encounter (Signed)
Called and spoke to patient. He states that when he woke up this morning he felt SOB and very tired similar to how he felt when he was in Afib before. He denies having palpitations, chest pain, lightheadedness, or dizziness, or any other Sx. He states that his BP was 113/62 and HR 65. He states that he took his regular morning medicines (including tikosyn, metoprolol, and eliquis) and is now Sx free. BP now is 108/58 HR 67. Patient has not missed any doses of his Eliquis. Instructed the patient to continue taking his Eliquis and let us know if Sx change or worsen. Instructed patient to keep f/u appt on 3/2. Patient verbalized understanding and thanked me for the call.

## 2020-01-24 NOTE — Telephone Encounter (Signed)
Patient c/o Palpitations:  High priority if patient c/o lightheadedness, shortness of breath, or chest pain  1) How long have you had palpitations/irregular HR/ Afib? Are you having the symptoms now? Doesn't know, he stated he just got up.   2) Are you currently experiencing lightheadedness, SOB or CP? No   3) Do you have a history of afib (atrial fibrillation) or irregular heart rhythm? yes  4) Have you checked your BP or HR? (document readings if available): 108/58 HR 67  5) Are you experiencing any other symptoms? No, just tired

## 2020-01-25 ENCOUNTER — Ambulatory Visit (HOSPITAL_COMMUNITY)
Admission: RE | Admit: 2020-01-25 | Discharge: 2020-01-25 | Disposition: A | Discharge status: Home / Self Care | Payer: Medicare Other | Source: Ambulatory Visit | Attending: Physician Assistant | Admitting: Physician Assistant

## 2020-01-25 ENCOUNTER — Other Ambulatory Visit: Payer: Self-pay

## 2020-01-25 ENCOUNTER — Encounter (HOSPITAL_COMMUNITY): Payer: Self-pay | Admitting: Physician Assistant

## 2020-01-25 ENCOUNTER — Ambulatory Visit (INDEPENDENT_AMBULATORY_CARE_PROVIDER_SITE_OTHER): Payer: Medicare Other | Admitting: *Deleted

## 2020-01-25 VITALS — BP 110/58 | HR 97 | Ht 74.0 in | Wt 208.8 lb

## 2020-01-25 DIAGNOSIS — D6869 Other thrombophilia: Secondary | ICD-10-CM | POA: Diagnosis not present

## 2020-01-25 DIAGNOSIS — Z880 Allergy status to penicillin: Secondary | ICD-10-CM | POA: Insufficient documentation

## 2020-01-25 DIAGNOSIS — I493 Ventricular premature depolarization: Secondary | ICD-10-CM | POA: Diagnosis not present

## 2020-01-25 DIAGNOSIS — Z951 Presence of aortocoronary bypass graft: Secondary | ICD-10-CM | POA: Diagnosis not present

## 2020-01-25 DIAGNOSIS — Z888 Allergy status to other drugs, medicaments and biological substances status: Secondary | ICD-10-CM | POA: Diagnosis not present

## 2020-01-25 DIAGNOSIS — I2510 Atherosclerotic heart disease of native coronary artery without angina pectoris: Secondary | ICD-10-CM | POA: Diagnosis not present

## 2020-01-25 DIAGNOSIS — Z79899 Other long term (current) drug therapy: Secondary | ICD-10-CM | POA: Insufficient documentation

## 2020-01-25 DIAGNOSIS — Z87891 Personal history of nicotine dependence: Secondary | ICD-10-CM | POA: Insufficient documentation

## 2020-01-25 DIAGNOSIS — Z85828 Personal history of other malignant neoplasm of skin: Secondary | ICD-10-CM | POA: Diagnosis not present

## 2020-01-25 DIAGNOSIS — I129 Hypertensive chronic kidney disease with stage 1 through stage 4 chronic kidney disease, or unspecified chronic kidney disease: Secondary | ICD-10-CM | POA: Diagnosis not present

## 2020-01-25 DIAGNOSIS — Z7901 Long term (current) use of anticoagulants: Secondary | ICD-10-CM | POA: Diagnosis not present

## 2020-01-25 DIAGNOSIS — I480 Paroxysmal atrial fibrillation: Secondary | ICD-10-CM

## 2020-01-25 DIAGNOSIS — E538 Deficiency of other specified B group vitamins: Secondary | ICD-10-CM | POA: Diagnosis not present

## 2020-01-25 DIAGNOSIS — I714 Abdominal aortic aneurysm, without rupture: Secondary | ICD-10-CM | POA: Diagnosis not present

## 2020-01-25 DIAGNOSIS — Z8249 Family history of ischemic heart disease and other diseases of the circulatory system: Secondary | ICD-10-CM | POA: Diagnosis not present

## 2020-01-25 DIAGNOSIS — N184 Chronic kidney disease, stage 4 (severe): Secondary | ICD-10-CM | POA: Insufficient documentation

## 2020-01-25 DIAGNOSIS — E785 Hyperlipidemia, unspecified: Secondary | ICD-10-CM | POA: Insufficient documentation

## 2020-01-25 DIAGNOSIS — Z7984 Long term (current) use of oral hypoglycemic drugs: Secondary | ICD-10-CM | POA: Insufficient documentation

## 2020-01-25 DIAGNOSIS — E1122 Type 2 diabetes mellitus with diabetic chronic kidney disease: Secondary | ICD-10-CM | POA: Diagnosis not present

## 2020-01-25 DIAGNOSIS — Z833 Family history of diabetes mellitus: Secondary | ICD-10-CM | POA: Diagnosis not present

## 2020-01-25 LAB — COMPREHENSIVE METABOLIC PANEL
ALT: 15 U/L (ref 0–44)
AST: 19 U/L (ref 15–41)
Albumin: 3.7 g/dL (ref 3.5–5.0)
Alkaline Phosphatase: 59 U/L (ref 38–126)
Anion gap: 10 (ref 5–15)
BUN: 27 mg/dL — ABNORMAL HIGH (ref 8–23)
CO2: 25 mmol/L (ref 22–32)
Calcium: 9 mg/dL (ref 8.9–10.3)
Chloride: 106 mmol/L (ref 98–111)
Creatinine, Ser: 1.74 mg/dL — ABNORMAL HIGH (ref 0.61–1.24)
GFR calc Af Amer: 42 mL/min — ABNORMAL LOW (ref >60)
GFR calc non Af Amer: 36 mL/min — ABNORMAL LOW (ref >60)
Glucose, Bld: 112 mg/dL — ABNORMAL HIGH (ref 70–99)
Potassium: 4.4 mmol/L (ref 3.5–5.1)
Sodium: 141 mmol/L (ref 135–145)
Total Bilirubin: 0.4 mg/dL (ref 0.3–1.2)
Total Protein: 6.5 g/dL (ref 6.5–8.1)

## 2020-01-25 LAB — TSH: TSH: 3.885 u[IU]/mL (ref 0.350–4.500)

## 2020-01-25 MED ORDER — AMIODARONE HCL 200 MG PO TABS: ORAL_TABLET | ORAL | 0 refills | Status: DC

## 2020-01-25 MED ORDER — CYANOCOBALAMIN 1000 MCG/ML IJ SOLN
1000.0000 ug | Freq: Once | INTRAMUSCULAR | Status: AC
  Administered 2020-01-25: 1000 ug via INTRAMUSCULAR

## 2020-01-25 NOTE — Patient Instructions (Signed)
Stop Tikosyn Start Amiodarone 200mg  BID in 3 DAYS and will decrease to one tablet daily in one month

## 2020-01-25 NOTE — Progress Notes (Signed)
Reviewed for [name], PA  Thanks for seeing him.   Cecille Rubin

## 2020-01-25 NOTE — Progress Notes (Signed)
Pls cosign for B12 in.Marland KitchenJohny Marshall

## 2020-01-25 NOTE — Progress Notes (Signed)
Primary Care Physician: [name], MD Primary Cardiologist: Dr Burt Knack Primary Electrophysiologist: Dr Lovena Le Referring Physician: [name] PA   Thomas Marshall is a 79 y.o. male with a history of CAD status post CABG, symptomatic PVCs status post ablation 2018, paroxymal atrial fibrillation on dofetilide, carotid disease status post right carotid endarterectomy, DM, small AAA, hypertension, CKD stage III-IV and HLD who presents for follow up in the [facility]. Patient is on Eliquis for a CHADS2VASC score of 7. Patient seen in follow up by [name] PA for several weeks of increasing dyspnea. Patient was found to be in atrial fibrillation and DCCV was scheduled. Patient presented in SR and the procedure was cancelled. Since then, patient reports that he has had more SOB and fatigue. He checks his heart rate on his home BP machine which has shown rates in the 70s-120s. Patient reports that he was briefly on amiodarone in 2004 after his CABG and he tolerated the medication well.  Today, he denies symptoms of palpitations, chest pain, orthopnea, PND, lower extremity edema, dizziness, presyncope, syncope, snoring, daytime somnolence, bleeding, or neurologic sequela. The patient is tolerating medications without difficulties and is otherwise without complaint today.    Atrial Fibrillation Risk Factors:  he does not have symptoms or diagnosis of sleep apnea. he does not have a history of rheumatic fever.   he has a BMI of Body mass index is 26.81 kg/m.[name] Filed Weights   01/25/20 0933  Weight: 94.7 kg    Family History  Problem Relation Age of Onset  . Stroke Mother   . Heart disease Mother   . Heart disease Father   . Hypertension Father   . Hyperlipidemia Father   . Cancer Sister        breast  and skin  . Diabetes Sister   . Hypertension Sister   . Diabetes Brother   . Heart disease Brother   . Hyperlipidemia Brother   . Hypertension Brother    . Early death Neg Hx      Atrial Fibrillation Management history:  Previous antiarrhythmic drugs: amiodarone, dofetilide Previous cardioversions: none Previous ablations: 2018 PVC CHADS2VASC score: 7 Anticoagulation history: Eliquis   Past Medical History:  Diagnosis Date  . AAA (abdominal aortic aneurysm) ([place])   . Arthritis   . Cancer (HCC)    skin & squamous cell  . Carotid artery disease ([place])    a. s/p R CEA 12/2015.  [name] CKD (chronic kidney disease), stage III    a. per historical labs.  . Coronary artery disease    a. s/p CABG 2004.  . Diabetes mellitus without complication ([place])   . GERD (gastroesophageal reflux disease)   . Hyperlipidemia   . Hypertension   . Medication intolerance Multiple   . PAF (paroxysmal atrial fibrillation) ([place])   . Pneumonia   . Sinus bradycardia    a. baseline HR 50s-60s, also h/o bradycardia on metoprolol and carvedilol   Past Surgical History:  Procedure Laterality Date  . ANKLE SURGERY     right fused  . BACK SURGERY     low back   . CHOLECYSTECTOMY N/A 12/07/2013   Procedure: LAPAROSCOPIC CHOLECYSTECTOMY ;  Surgeon: [name], MD;  Location: [facility];  Service: General;  Laterality: N/A;  . COLONOSCOPY    . CORONARY ARTERY BYPASS GRAFT  2004   [facility]  . CORONARY STENT INTERVENTION N/A 05/21/2017   Procedure: Coronary Stent Intervention;  Surgeon: [name],  MD;  Location: [facility];  Service: Cardiovascular;  Laterality: N/A;  . ENDARTERECTOMY Right 12/10/2015   Procedure: Right Carotid ENDARTERECTOMY;  Surgeon: Conrad Symerton, MD;  Location: [facility];  Service: Vascular;  Laterality: Right;  . EXPLORATION POST OPERATIVE OPEN HEART    . FINGER SURGERY     skin graft on rt index  . FINGER SURGERY Right    right index finger.  [name] HERNIA REPAIR Right    [facility]  . JOINT REPLACEMENT    . KNEE SURGERY     replacement on both knees  . LEFT HEART CATH AND CORS/GRAFTS ANGIOGRAPHY N/A 05/21/2017   Procedure: Left  Heart Cath and Cors/Grafts Angiography;  Surgeon: [name], MD;  Location: [facility];  Service: Cardiovascular;  Laterality: N/A;  . PROSTATE SURGERY    . PVC ABLATION N/A 10/27/2017   Procedure: PVC ABLATION;  Surgeon: Evans Lance, MD;  Location: [facility];  Service: Cardiovascular;  Laterality: N/A;  . TONSILLECTOMY    . URINARY SURGERY     scar tissue    Current Outpatient Medications  Medication Sig Dispense Refill  . amLODipine (NORVASC) 10 MG tablet Take 1 tablet (10 mg total) by mouth daily. 30 tablet 11  . apixaban (ELIQUIS) 2.5 MG TABS tablet Take 1 tablet (2.5 mg total) by mouth 2 (two) times daily. (0800 & 2000) 180 tablet 3  . aspirin EC 81 MG tablet Take 1 tablet (81 mg total) by mouth daily. 90 tablet 3  . atorvastatin (LIPITOR) 40 MG tablet Take 40 mg by mouth at bedtime.     . Calcium-Magnesium-Zinc (CAL-MAG-ZINC PO) Take 1 tablet by mouth daily.    . Cholecalciferol (VITAMIN D-3) 25 MCG (1000 UT) CAPS Take 1,000 Units by mouth at bedtime.    . cloNIDine (CATAPRES) 0.1 MG tablet Take 1 tablet (0.1 mg total) by mouth 2 (two) times daily. 180 tablet 3  . Cyanocobalamin 1000 MCG/ML KIT Inject 1,000 mcg as directed every 30 (thirty) days.    [name] glucose blood test strip Accu-Check Guide Strips. Dx E11.9 Use to test blood glucose level 2 times a day. 50 each 11  . ibuprofen (ADVIL) 200 MG tablet Take 200 mg by mouth daily as needed for moderate pain.    . magnesium oxide (MAG-OX) 400 MG tablet Take 1 tablet (400 mg total) by mouth 4 (four) times daily. (Patient taking differently: Take 800 mg by mouth 2 (two) times daily. ) 360 tablet 3  . metFORMIN (GLUCOPHAGE) 1000 MG tablet Take 1 tablet (1,000 mg total) by mouth 2 (two) times daily with a meal. Annual appt due in May must see provider for future refills 180 tablet 0  . metoprolol tartrate (LOPRESSOR) 25 MG tablet Take 0.5 tablets (12.5 mg total) by mouth 2 (two) times daily. 30 tablet 0  .  nitroGLYCERIN (NITROSTAT) 0.4 MG SL tablet Place 1 tablet (0.4 mg total) under the tongue every 5 (five) minutes as needed for chest pain (x 3 doses). 30 tablet 3  . pantoprazole (PROTONIX) 40 MG tablet Take 1 tablet (40 mg total) by mouth daily. 90 tablet 3  . terazosin (HYTRIN) 1 MG capsule Take 1 mg by mouth at bedtime.  30 capsule 11  . vitamin C (ASCORBIC ACID) 500 MG tablet Take 1,000 mg by mouth daily with breakfast.     . Zinc Acetate, Oral, (ZINC ACETATE PO) Take 1 tablet by mouth daily.    Derrill Memo ON 01/29/2020]  amiodarone (PACERONE) 200 MG tablet Take one tablet by mouth twice daily for one month then decrease to one tablet daily 60 tablet 0   No current facility-administered medications for this encounter.    Allergies  Allergen Reactions  . Lisinopril Swelling and Other (See Comments)    Angioedema.   . Losartan Swelling and Other (See Comments)    Lips swell Angioedema   . Nifedipine Swelling and Other (See Comments)    Sugar increase  . Triamterene Swelling and Other (See Comments)    Face swells, no breathing impairment  . Hydralazine Hcl Other (See Comments)    Fatigue; poor appetite  . Losartan Potassium-Hctz Swelling and Other (See Comments)  . Amoxicillin Rash and Other (See Comments)    Did it involve swelling of the face/tongue/throat, SOB, or low BP? No Did it involve sudden or severe rash/hives, skin peeling, or any reaction on the inside of your mouth or nose? No Did you need to seek medical attention at a hospital or doctor's office? No When did it last happen?10 + years If all above answers are "NO", may proceed with cephalosporin use.    . Ampicillin Rash and Other (See Comments)    Did it involve swelling of the face/tongue/throat, SOB, or low BP? No Did it involve sudden or severe rash/hives, skin peeling, or any reaction on the inside of your mouth or nose? No Did you need to seek medical attention at a hospital or doctor's office? No When did  it last happen?10 + years If all above answers are "NO", may proceed with cephalosporin use.   . Carvedilol Other (See Comments)    Low heart rate     Social History   Socioeconomic History  . Marital status: Married    Spouse name: Not on file  . Number of children: 2  . Years of education: Not on file  . Highest education level: Not on file  Occupational History  . Occupation: retired  Tobacco Use  . Smoking status: Former Smoker    Packs/day: 1.00    Years: 13.00    Pack years: 13.00    Quit date: 11/30/1970    Years since quitting: 49.1  . Smokeless tobacco: Never Used  Substance and Sexual Activity  . Alcohol use: No    Alcohol/week: 0.0 standard drinks  . Drug use: No  . Sexual activity: Yes  Other Topics Concern  . Not on file  Social History Narrative  . Not on file   Social Determinants of Health   Financial Resource Strain:   . Difficulty of Paying Living Expenses: Not on file  Food Insecurity:   . Worried About Charity fundraiser in the Last Year: Not on file  . Ran Out of Food in the Last Year: Not on file  Transportation Needs:   . Lack of Transportation (Medical): Not on file  . Lack of Transportation (Non-Medical): Not on file  Physical Activity: Sufficiently Active  . Days of Exercise per Week: 4 days  . Minutes of Exercise per Session: 40 min  Stress:   . Feeling of Stress : Not on file  Social Connections:   . Frequency of Communication with Friends and Family: Not on file  . Frequency of Social Gatherings with Friends and Family: Not on file  . Attends Religious Services: Not on file  . Active Member of Clubs or Organizations: Not on file  . Attends Archivist Meetings: Not on file  . Marital  Status: Not on file  Intimate Partner Violence:   . Fear of Current or Ex-Partner: Not on file  . Emotionally Abused: Not on file  . Physically Abused: Not on file  . Sexually Abused: Not on file     ROS- All systems are reviewed  and negative except as per the HPI above.  Physical Exam: Vitals:   01/25/20 0933  BP: (!) 110/58  Pulse: 97  SpO2: 98%  Weight: 94.7 kg  Height: 6' 2" (1.88 m)    GEN- The patient is well appearing elderly male, alert and oriented x 3 today.   Head- normocephalic, atraumatic Eyes-  Sclera clear, conjunctiva pink Ears- hearing intact Oropharynx- clear Neck- supple  Lungs- Clear to ausculation bilaterally, normal work of breathing Heart- irregular rate and rhythm, no murmurs, rubs or gallops  GI- soft, NT, ND, + BS Extremities- no clubbing, cyanosis, or edema MS- no significant deformity or atrophy Skin- no rash or lesion Psych- euthymic mood, full affect Neuro- strength and sensation are intact  Wt Readings from Last 3 Encounters:  01/25/20 94.7 kg  01/22/20 93 kg  01/17/20 96.6 kg    EKG today demonstrates afib HR 97, RBBB, LAFB, QRS 130, QTc 497  Echo 09/29/17 demonstrated  Left ventricle: The cavity size was normal. There was moderate  focal basal hypertrophy of the septum with otherwise moderate  concentric hypertrophy. Systolic function was normal. The  estimated ejection fraction was in the range of 55% to 60%. Wall  motion was normal; there were no regional wall motion  abnormalities. The study is not technically sufficient to allow  evaluation of LV diastolic function.  - Aortic valve: Trileaflet; severely thickened, severely calcified  leaflets. The non-coronary cusp is most affected. Noncoronary  cusp mobility was severely restricted. Valve area (VTI): 2.39  cm^2. Valve area (Vmax): 2.36 cm^2. Valve area (Vmean): 1.85  cm^2.  - Mitral valve: Moderately calcified annulus. Transvalvular  velocity was within the normal range. There was no evidence for  stenosis. There was mild regurgitation.  - Left atrium: The atrium was moderately dilated.  - Right ventricle: The cavity size was normal. Wall thickness was  normal. Systolic  function was normal.  - Atrial septum: No defect or patent foramen ovale was identified  by color flow Doppler.  - Tricuspid valve: There was trivial regurgitation.  - Pulmonary arteries: Systolic pressure was mildly increased. PA  peak pressure: 41 mm Hg (S).   Epic records are reviewed at length today  CHA2DS2-VASc Score = 7 The patient's score is based upon: CHF History: No HTN History: Yes Age : 57 + Diabetes History: Yes Stroke History: Yes Vascular Disease History: Yes Gender: Male      ASSESSMENT AND PLAN: 1. Paroxysmal Atrial Fibrillation (ICD10:  I48.0) The patient's CHA2DS2-VASc score is 7, indicating a 11.2% annual risk of stroke.   Patient having increased burden of symptomatic afib. I feel this represents a failure of dofetilide. Will not scheduled DCCV at this time as he appears paroxysmal.  We discussed therapeutic options including changing AAD.  Stop dofetilide. Start amiodarone 200 mg BID after 3 day washout. Check Cmet/TSH today. Continue Eliquis 2.5 mg BID Continue Lopressor 25 mg BID for rate control.   2. Secondary Hypercoagulable State (ICD10:  D68.69) The patient is at significant risk for stroke/thromboembolism based upon his CHA2DS2-VASc Score of 7.  Continue Apixaban (Eliquis).   3. PVC S/p ablation 2018  4. CAD S/p CABG, DES to SVG in 2018. No anginal  symptoms.  5. HTN Stable, no changes today.  6. Dyspnea Likely related to #1. If symptoms persist despite SR, would consider ischemic evaluation vs progression of valvular disease.    Follow up in the AF clinic in 2 weeks.   [facility] [address] [place], [place] [address] [phone] 01/25/2020 10:16 AM

## 2020-01-30 ENCOUNTER — Ambulatory Visit: Payer: Medicare Other | Admitting: Physician Assistant

## 2020-01-30 ENCOUNTER — Ambulatory Visit (INDEPENDENT_AMBULATORY_CARE_PROVIDER_SITE_OTHER): Payer: Medicare Other | Admitting: Podiatry

## 2020-01-30 ENCOUNTER — Other Ambulatory Visit: Payer: Self-pay

## 2020-01-30 ENCOUNTER — Encounter: Payer: Self-pay | Admitting: Podiatry

## 2020-01-30 VITALS — Temp 97.4°F

## 2020-01-30 DIAGNOSIS — D689 Coagulation defect, unspecified: Secondary | ICD-10-CM | POA: Diagnosis not present

## 2020-01-30 DIAGNOSIS — B351 Tinea unguium: Secondary | ICD-10-CM | POA: Diagnosis not present

## 2020-01-30 DIAGNOSIS — E1142 Type 2 diabetes mellitus with diabetic polyneuropathy: Secondary | ICD-10-CM | POA: Diagnosis not present

## 2020-01-30 NOTE — Progress Notes (Signed)
Complaint:  Visit Type: Patient returns to my office for continued preventative foot care services. Complaint: Patient states" my nails have grown long and thick and become painful to walk and wear shoes" Patient has been diagnosed with DM with no foot complications. The patient presents for preventative foot care services. No changes to ROS.  Patient is taking eliquiss.  The problem lists says he has pvd.  Podiatric Exam: Vascular: dorsalis pedis and posterior tibial pulses are weakly  palpable bilateral. Capillary return is immediate. Cold feet. Skin turgor WNL  Sensorium: Diminished  Semmes Weinstein monofilament test. Normal tactile sensation bilaterally. Nail Exam: Pt has thick disfigured discolored nails with subungual debris noted bilateral entire nail hallux through fifth toenails Ulcer Exam: There is no evidence of ulcer or pre-ulcerative changes or infection. Orthopedic Exam: Muscle tone and strength are WNL. No limitations in general ROM. No crepitus or effusions noted. Foot type and digits show no abnormalities. Bony prominences fifth metabase right foot. Skin: No Porokeratosis. No infection or ulcers.  Asymptomatic callus sub 5th metabase right and sub 5th met head right.  Diagnosis:  Onychomycosis, , Pain in right toe, pain in left toes  Treatment & Plan Procedures and Treatment: Consent by patient was obtained for treatment procedures.   Debridement of mycotic and hypertrophic toenails, 1 through 5 bilateral and clearing of subungual debris. No ulceration, no infection noted.  Return Visit-Office Procedure: Patient instructed to return to the office for a follow up visit 3 months for continued evaluation and treatment.    Gardiner Barefoot DPM

## 2020-02-01 ENCOUNTER — Ambulatory Visit: Payer: Medicare Other

## 2020-02-01 ENCOUNTER — Encounter (HOSPITAL_COMMUNITY): Payer: Medicare Other

## 2020-02-06 ENCOUNTER — Telehealth (HOSPITAL_COMMUNITY): Payer: Self-pay

## 2020-02-06 ENCOUNTER — Other Ambulatory Visit: Payer: Self-pay | Admitting: *Deleted

## 2020-02-06 DIAGNOSIS — I6523 Occlusion and stenosis of bilateral carotid arteries: Secondary | ICD-10-CM

## 2020-02-06 NOTE — Telephone Encounter (Signed)
The above patient or their representative was contacted and gave the following answers to these questions:         Do you have any of the following symptoms?    NO  Fever                    Cough                   Shortness of breath  Do  you have any of the following other symptoms?    muscle pain         vomiting,        diarrhea        rash         weakness        red eye        abdominal pain         bruising          bruising or bleeding              joint pain           severe headache    Have you been in contact with someone who was or has been sick in the past 2 weeks?  NO  Yes                 Unsure                         Unable to assess   Does the person that you were in contact with have any of the following symptoms?   Cough         shortness of breath           muscle pain         vomiting,            diarrhea            rash            weakness           fever            red eye           abdominal pain           bruising  or  bleeding                joint pain                severe headache                 COMMENTS OR ACTION PLAN FOR THIS PATIENT:        ALL QUESTION WERE ANSWERED/[facility] PATIENT WAS GIVEN BOTH COVID SHOTS

## 2020-02-07 ENCOUNTER — Ambulatory Visit (HOSPITAL_COMMUNITY)
Admission: RE | Admit: 2020-02-07 | Discharge: 2020-02-07 | Disposition: A | Discharge status: Home / Self Care | Payer: Medicare Other | Source: Ambulatory Visit | Attending: Vascular Surgery | Admitting: Vascular Surgery

## 2020-02-07 ENCOUNTER — Other Ambulatory Visit: Payer: Self-pay

## 2020-02-07 ENCOUNTER — Ambulatory Visit (INDEPENDENT_AMBULATORY_CARE_PROVIDER_SITE_OTHER): Payer: Medicare Other | Admitting: Vascular Surgery

## 2020-02-07 ENCOUNTER — Encounter: Payer: Self-pay | Admitting: Vascular Surgery

## 2020-02-07 VITALS — BP 115/65 | HR 78 | Temp 97.7°F | Resp 20 | Ht 74.0 in | Wt 208.0 lb

## 2020-02-07 DIAGNOSIS — I6523 Occlusion and stenosis of bilateral carotid arteries: Secondary | ICD-10-CM

## 2020-02-07 DIAGNOSIS — I6522 Occlusion and stenosis of left carotid artery: Secondary | ICD-10-CM | POA: Diagnosis not present

## 2020-02-07 DIAGNOSIS — I714 Abdominal aortic aneurysm, without rupture, unspecified: Secondary | ICD-10-CM

## 2020-02-07 NOTE — Progress Notes (Signed)
Patient name: Thomas Marshall MRN: [id number] DOB: 02-27-1941 Sex: male  REASON FOR VISIT:   Follow-up of left carotid stenosis  HPI:   Thomas Marshall is a pleasant 79 y.o. male who I saw in the hospital on 11/21/2019 with a left carotid stenosis.  Patient has multiple medical comorbidities including type 2 diabetes, paroxysmal atrial fibrillation, stage III chronic kidney disease, coronary artery disease.  He underwent a carotid endarterectomy on the right by Dr. Adele Barthel in 2017.  We were consulted concerning a left carotid stenosis based on a CT angiogram of the neck.  The patient had had some left sided tingling and numbness.  He also has a known small abdominal aortic aneurysm.  Since I saw him in the hospital his main complaint is fatigue.  He also has had issues with shortness of breath.  He came into the hospital for cardioversion and was found to be in normal rhythm.  Therefore his procedure was canceled and the following day he went back into A. fib.  He is to be seen by the cardiologist tomorrow.  He has had some occasional weakness and paresthesias in the left arm and left leg.  He has no significant carotid disease on the right.  However he was found to have significant cervical disc disease at C5-C6.  He has not had any significant right arm or right leg weakness.  He denies any history of stroke, TIAs, expressive or receptive aphasia or amaurosis fugax.  I do not get any history of claudication although currently his activity is very limited by his fatigue and shortness of breath.  He has a known abdominal aortic aneurysm which is very small.  He denies any abdominal pain or back pain.  He would be due for a follow-up study in 1 year.  Past Medical History:  Diagnosis Date  . AAA (abdominal aortic aneurysm) ([place])   . Arthritis   . Cancer (HCC)    skin & squamous cell  . Carotid artery disease ([place])    a. s/p R CEA 12/2015.  [name] CKD (chronic kidney disease), stage III    a. per  historical labs.  . Coronary artery disease    a. s/p CABG 2004.  . Diabetes mellitus without complication ([place])   . GERD (gastroesophageal reflux disease)   . Hyperlipidemia   . Hypertension   . Medication intolerance Multiple   . PAF (paroxysmal atrial fibrillation) ([place])   . Pneumonia   . Sinus bradycardia    a. baseline HR 50s-60s, also h/o bradycardia on metoprolol and carvedilol    Family History  Problem Relation Age of Onset  . Stroke Mother   . Heart disease Mother   . Heart disease Father   . Hypertension Father   . Hyperlipidemia Father   . Cancer Sister        breast  and skin  . Diabetes Sister   . Hypertension Sister   . Diabetes Brother   . Heart disease Brother   . Hyperlipidemia Brother   . Hypertension Brother   . Early death Neg Hx     SOCIAL HISTORY: Social History   Tobacco Use  . Smoking status: Former Smoker    Packs/day: 1.00    Years: 13.00    Pack years: 13.00    Quit date: 11/30/1970    Years since quitting: 49.2  . Smokeless tobacco: Never Used  Substance Use Topics  . Alcohol use: No    Alcohol/week: 0.0 standard  drinks    Allergies  Allergen Reactions  . Lisinopril Swelling and Other (See Comments)    Angioedema.   . Losartan Swelling and Other (See Comments)    Lips swell Angioedema   . Nifedipine Swelling and Other (See Comments)    Sugar increase  . Triamterene Swelling and Other (See Comments)    Face swells, no breathing impairment  . Hydralazine Hcl Other (See Comments)    Fatigue; poor appetite  . Losartan Potassium-Hctz Swelling and Other (See Comments)  . Amoxicillin Rash and Other (See Comments)    Did it involve swelling of the face/tongue/throat, SOB, or low BP? No Did it involve sudden or severe rash/hives, skin peeling, or any reaction on the inside of your mouth or nose? No Did you need to seek medical attention at a hospital or doctor's office? No When did it last happen?10 + years If all above  answers are "NO", may proceed with cephalosporin use.    . Ampicillin Rash and Other (See Comments)    Did it involve swelling of the face/tongue/throat, SOB, or low BP? No Did it involve sudden or severe rash/hives, skin peeling, or any reaction on the inside of your mouth or nose? No Did you need to seek medical attention at a hospital or doctor's office? No When did it last happen?10 + years If all above answers are "NO", may proceed with cephalosporin use.   . Carvedilol Other (See Comments)    Low heart rate     Current Outpatient Medications  Medication Sig Dispense Refill  . amiodarone (PACERONE) 200 MG tablet Take one tablet by mouth twice daily for one month then decrease to one tablet daily 60 tablet 0  . amLODipine (NORVASC) 10 MG tablet Take 1 tablet (10 mg total) by mouth daily. 30 tablet 11  . apixaban (ELIQUIS) 2.5 MG TABS tablet Take 1 tablet (2.5 mg total) by mouth 2 (two) times daily. (0800 & 2000) 180 tablet 3  . aspirin EC 81 MG tablet Take 1 tablet (81 mg total) by mouth daily. 90 tablet 3  . atorvastatin (LIPITOR) 40 MG tablet Take 40 mg by mouth at bedtime.     . Calcium-Magnesium-Zinc (CAL-MAG-ZINC PO) Take 1 tablet by mouth daily.    . Cholecalciferol (VITAMIN D-3) 25 MCG (1000 UT) CAPS Take 1,000 Units by mouth at bedtime.    . cloNIDine (CATAPRES) 0.1 MG tablet Take 1 tablet (0.1 mg total) by mouth 2 (two) times daily. 180 tablet 3  . Cyanocobalamin 1000 MCG/ML KIT Inject 1,000 mcg as directed every 30 (thirty) days.    [name] glucose blood test strip Accu-Check Guide Strips. Dx E11.9 Use to test blood glucose level 2 times a day. 50 each 11  . ibuprofen (ADVIL) 200 MG tablet Take 200 mg by mouth daily as needed for moderate pain.    . magnesium oxide (MAG-OX) 400 MG tablet Take 1 tablet (400 mg total) by mouth 4 (four) times daily. (Patient taking differently: Take 800 mg by mouth 2 (two) times daily. ) 360 tablet 3  . metFORMIN (GLUCOPHAGE) 1000 MG tablet  Take 1 tablet (1,000 mg total) by mouth 2 (two) times daily with a meal. Annual appt due in May must see provider for future refills 180 tablet 0  . metoprolol tartrate (LOPRESSOR) 25 MG tablet Take 0.5 tablets (12.5 mg total) by mouth 2 (two) times daily. 30 tablet 0  . nitroGLYCERIN (NITROSTAT) 0.4 MG SL tablet Place 1 tablet (0.4 mg total) under  the tongue every 5 (five) minutes as needed for chest pain (x 3 doses). 30 tablet 3  . terazosin (HYTRIN) 1 MG capsule Take 1 mg by mouth at bedtime.  30 capsule 11  . vitamin C (ASCORBIC ACID) 500 MG tablet Take 1,000 mg by mouth daily with breakfast.     . Zinc Acetate, Oral, (ZINC ACETATE PO) Take 1 tablet by mouth daily.    . pantoprazole (PROTONIX) 40 MG tablet Take 1 tablet (40 mg total) by mouth daily. 90 tablet 3   No current facility-administered medications for this visit.    REVIEW OF SYSTEMS:  '[X]'$  denotes positive finding, '[ ]'$  denotes negative finding Cardiac  Comments:  Chest pain or chest pressure:    Shortness of breath upon exertion: x   Short of breath when lying flat:    Irregular heart rhythm: x       Vascular    Pain in calf, thigh, or hip brought on by ambulation: x   Pain in feet at night that wakes you up from your sleep:     Blood clot in your veins:    Leg swelling:         Pulmonary    Oxygen at home:    Productive cough:     Wheezing:         Neurologic    Sudden weakness in arms or legs:     Sudden numbness in arms or legs:     Sudden onset of difficulty speaking or slurred speech:    Temporary loss of vision in one eye:     Problems with dizziness:         Gastrointestinal    Blood in stool:     Vomited blood:         Genitourinary    Burning when urinating:     Blood in urine:        Psychiatric    Major depression:         Hematologic    Bleeding problems:    Problems with blood clotting too easily:        Skin    Rashes or ulcers:        Constitutional    Fever or chills:      PHYSICAL EXAM:   Vitals:   02/07/20 1456  BP: 124/64  Pulse: 78  Resp: 20  Temp: 97.7 F (36.5 C)  SpO2: 99%  Weight: 208 lb (94.3 kg)  Height: '6\' 2"'$  (1.88 m)    GENERAL: The patient is a well-nourished male, in no acute distress. The vital signs are documented above. CARDIAC: There is a regular rate and rhythm.  VASCULAR: I do not detect carotid bruits. He has no significant lower extremity swelling. PULMONARY: There is good air exchange bilaterally without wheezing or rales. ABDOMEN: Soft and non-tender with normal pitched bowel sounds.  MUSCULOSKELETAL: There are no major deformities or cyanosis. NEUROLOGIC: No focal weakness or paresthesias are detected. SKIN: There are no ulcers or rashes noted. PSYCHIATRIC: The patient has a normal affect.  DATA:    DUPLEX ABDOMINAL AORTA: His last duplex was on 02/02/2018.  This showed that the maximum diameter of his infrarenal aorta was 3.2 cm.  He would be due for another study in 3 years from that date.  CAROTID DUPLEX: I have independently interpreted his carotid duplex scan today.  On the right side there is no evidence of recurrent carotid stenosis.  His had a right carotid endarterectomy in  the past.  Right vertebral artery is patent with antegrade flow.  On the left side he has a 60 to 79% stenosis.  This is stable compared to his study in the hospital.  Peak systolic velocities 638 cm/s with an end-diastolic velocity of 61 cm/s suggesting that this is in the mid to low end of that range.  The left vertebral artery is patent with antegrade flow.  MEDICAL ISSUES:   LEFT CAROTID STENOSIS: The patient has an asymptomatic 60 to 79% left carotid stenosis.  I explained that we would not consider left carotid endarterectomy unless he developed left hemispheric symptoms with the stenosis progressed to greater than 80%.  I have ordered a follow-up duplex scan in 6 months and I will see him back at that time.  He is on aspirin and is on a  statin.  He is not a smoker.  ABDOMINAL AORTIC ANEURYSM: The patient has a known small abdominal aortic aneurysm.  In March 2019 it was 3.2 cm.  He is due for a follow-up duplex scan in March 2022.  Deitra Mayo [facility] [phone]

## 2020-02-08 ENCOUNTER — Ambulatory Visit (HOSPITAL_COMMUNITY)
Admission: RE | Admit: 2020-02-08 | Discharge: 2020-02-08 | Disposition: A | Discharge status: Home / Self Care | Payer: Medicare Other | Source: Ambulatory Visit | Attending: Physician Assistant | Admitting: Physician Assistant

## 2020-02-08 ENCOUNTER — Other Ambulatory Visit: Payer: Self-pay | Admitting: *Deleted

## 2020-02-08 VITALS — BP 130/56 | HR 70 | Ht 74.0 in | Wt 214.6 lb

## 2020-02-08 DIAGNOSIS — Z7984 Long term (current) use of oral hypoglycemic drugs: Secondary | ICD-10-CM | POA: Insufficient documentation

## 2020-02-08 DIAGNOSIS — I714 Abdominal aortic aneurysm, without rupture: Secondary | ICD-10-CM | POA: Insufficient documentation

## 2020-02-08 DIAGNOSIS — Z803 Family history of malignant neoplasm of breast: Secondary | ICD-10-CM | POA: Diagnosis not present

## 2020-02-08 DIAGNOSIS — I2510 Atherosclerotic heart disease of native coronary artery without angina pectoris: Secondary | ICD-10-CM | POA: Insufficient documentation

## 2020-02-08 DIAGNOSIS — Z833 Family history of diabetes mellitus: Secondary | ICD-10-CM | POA: Insufficient documentation

## 2020-02-08 DIAGNOSIS — N183 Chronic kidney disease, stage 3 unspecified: Secondary | ICD-10-CM | POA: Diagnosis not present

## 2020-02-08 DIAGNOSIS — Z951 Presence of aortocoronary bypass graft: Secondary | ICD-10-CM | POA: Insufficient documentation

## 2020-02-08 DIAGNOSIS — Z96653 Presence of artificial knee joint, bilateral: Secondary | ICD-10-CM | POA: Insufficient documentation

## 2020-02-08 DIAGNOSIS — I6522 Occlusion and stenosis of left carotid artery: Secondary | ICD-10-CM

## 2020-02-08 DIAGNOSIS — Z79899 Other long term (current) drug therapy: Secondary | ICD-10-CM | POA: Diagnosis not present

## 2020-02-08 DIAGNOSIS — R0600 Dyspnea, unspecified: Secondary | ICD-10-CM | POA: Insufficient documentation

## 2020-02-08 DIAGNOSIS — Z87891 Personal history of nicotine dependence: Secondary | ICD-10-CM | POA: Diagnosis not present

## 2020-02-08 DIAGNOSIS — E785 Hyperlipidemia, unspecified: Secondary | ICD-10-CM | POA: Insufficient documentation

## 2020-02-08 DIAGNOSIS — I4819 Other persistent atrial fibrillation: Secondary | ICD-10-CM | POA: Insufficient documentation

## 2020-02-08 DIAGNOSIS — I480 Paroxysmal atrial fibrillation: Secondary | ICD-10-CM | POA: Diagnosis present

## 2020-02-08 DIAGNOSIS — Z955 Presence of coronary angioplasty implant and graft: Secondary | ICD-10-CM | POA: Insufficient documentation

## 2020-02-08 DIAGNOSIS — I129 Hypertensive chronic kidney disease with stage 1 through stage 4 chronic kidney disease, or unspecified chronic kidney disease: Secondary | ICD-10-CM | POA: Insufficient documentation

## 2020-02-08 DIAGNOSIS — E1122 Type 2 diabetes mellitus with diabetic chronic kidney disease: Secondary | ICD-10-CM | POA: Insufficient documentation

## 2020-02-08 DIAGNOSIS — Z7901 Long term (current) use of anticoagulants: Secondary | ICD-10-CM | POA: Insufficient documentation

## 2020-02-08 DIAGNOSIS — Z888 Allergy status to other drugs, medicaments and biological substances status: Secondary | ICD-10-CM | POA: Insufficient documentation

## 2020-02-08 DIAGNOSIS — Z8249 Family history of ischemic heart disease and other diseases of the circulatory system: Secondary | ICD-10-CM | POA: Diagnosis not present

## 2020-02-08 DIAGNOSIS — Z9889 Other specified postprocedural states: Secondary | ICD-10-CM

## 2020-02-08 DIAGNOSIS — Z7982 Long term (current) use of aspirin: Secondary | ICD-10-CM | POA: Insufficient documentation

## 2020-02-08 DIAGNOSIS — Z823 Family history of stroke: Secondary | ICD-10-CM | POA: Diagnosis not present

## 2020-02-08 DIAGNOSIS — Z808 Family history of malignant neoplasm of other organs or systems: Secondary | ICD-10-CM | POA: Insufficient documentation

## 2020-02-08 DIAGNOSIS — K219 Gastro-esophageal reflux disease without esophagitis: Secondary | ICD-10-CM | POA: Diagnosis not present

## 2020-02-08 DIAGNOSIS — Z880 Allergy status to penicillin: Secondary | ICD-10-CM | POA: Insufficient documentation

## 2020-02-08 DIAGNOSIS — D6869 Other thrombophilia: Secondary | ICD-10-CM | POA: Diagnosis not present

## 2020-02-08 LAB — BASIC METABOLIC PANEL
Anion gap: 10 (ref 5–15)
BUN: 30 mg/dL — ABNORMAL HIGH (ref 8–23)
CO2: 25 mmol/L (ref 22–32)
Calcium: 8.9 mg/dL (ref 8.9–10.3)
Chloride: 103 mmol/L (ref 98–111)
Creatinine, Ser: 1.95 mg/dL — ABNORMAL HIGH (ref 0.61–1.24)
GFR calc Af Amer: 37 mL/min — ABNORMAL LOW (ref >60)
GFR calc non Af Amer: 32 mL/min — ABNORMAL LOW (ref >60)
Glucose, Bld: 113 mg/dL — ABNORMAL HIGH (ref 70–99)
Potassium: 5.3 mmol/L — ABNORMAL HIGH (ref 3.5–5.1)
Sodium: 138 mmol/L (ref 135–145)

## 2020-02-08 LAB — CBC
HCT: 34.8 % — ABNORMAL LOW (ref 39.0–52.0)
Hemoglobin: 10.7 g/dL — ABNORMAL LOW (ref 13.0–17.0)
MCH: 29 pg (ref 26.0–34.0)
MCHC: 30.7 g/dL (ref 30.0–36.0)
MCV: 94.3 fL (ref 80.0–100.0)
Platelets: 153 10*3/uL (ref 150–400)
RBC: 3.69 MIL/uL — ABNORMAL LOW (ref 4.22–5.81)
RDW: 14.6 % (ref 11.5–15.5)
WBC: 6 10*3/uL (ref 4.0–10.5)
nRBC: 0 % (ref 0.0–0.2)

## 2020-02-08 MED ORDER — METOPROLOL TARTRATE 25 MG PO TABS: 25.0000 mg | ORAL_TABLET | Freq: Two times a day (BID) | ORAL | Status: DC

## 2020-02-08 NOTE — H&P (View-Only) (Signed)
Primary Care Physician: [name], MD Primary Cardiologist: Dr Burt Knack Primary Electrophysiologist: Dr Lovena Le Referring Physician: [name] PA   Thomas Marshall is a 79 y.o. male with a history of CAD status post CABG, symptomatic PVCs status post ablation 2018, paroxymal atrial fibrillation on dofetilide, carotid disease status post right carotid endarterectomy, DM, small AAA, hypertension, CKD stage III-IV and HLD who presents for follow up in the [facility]. Patient is on Eliquis for a CHADS2VASC score of 7. Patient seen in follow up by [name] PA for several weeks of increasing dyspnea. Patient was found to be in atrial fibrillation and DCCV was scheduled. Patient presented in [place] and the procedure was cancelled. Since then, patient reports that he has had more SOB and fatigue. He checks his heart rate on his home BP machine which has shown rates in the 70s-120s. Patient reports that he was briefly on amiodarone in 2004 after his CABG and he tolerated the medication well.  On follow up today, patient reports he is about the same as his last visit. He continues to have SOB and fatigue. His is in rate controlled afib today. He brings in a BP and heart rate log which show mostly controlled heart rates. He is tolerating the medication without difficulty. His weight by his home scale has remained stable.  Today, he denies symptoms of palpitations, chest pain, orthopnea, PND, lower extremity edema, dizziness, presyncope, syncope, snoring, daytime somnolence, bleeding, or neurologic sequela. The patient is tolerating medications without difficulties and is otherwise without complaint today.    Atrial Fibrillation Risk Factors:  he does not have symptoms or diagnosis of sleep apnea. he does not have a history of rheumatic fever.   he has a BMI of Body mass index is 27.55 kg/m.[name] Filed Weights   02/08/20 0954  Weight: 97.3 kg    Family History  Problem  Relation Age of Onset  . Stroke Mother   . Heart disease Mother   . Heart disease Father   . Hypertension Father   . Hyperlipidemia Father   . Cancer Sister        breast  and skin  . Diabetes Sister   . Hypertension Sister   . Diabetes Brother   . Heart disease Brother   . Hyperlipidemia Brother   . Hypertension Brother   . Early death Neg Hx      Atrial Fibrillation Management history:  Previous antiarrhythmic drugs: amiodarone, dofetilide Previous cardioversions: none Previous ablations: 2018 PVC CHADS2VASC score: 7 Anticoagulation history: Eliquis   Past Medical History:  Diagnosis Date  . AAA (abdominal aortic aneurysm) ([place])   . Arthritis   . Cancer (HCC)    skin & squamous cell  . Carotid artery disease ([place])    a. s/p R CEA 12/2015.  [name] CKD (chronic kidney disease), stage III    a. per historical labs.  . Coronary artery disease    a. s/p CABG 2004.  . Diabetes mellitus without complication ([place])   . GERD (gastroesophageal reflux disease)   . Hyperlipidemia   . Hypertension   . Medication intolerance Multiple   . PAF (paroxysmal atrial fibrillation) ([place])   . Pneumonia   . Sinus bradycardia    a. baseline HR 50s-60s, also h/o bradycardia on metoprolol and carvedilol   Past Surgical History:  Procedure Laterality Date  . ANKLE SURGERY     right fused  . BACK SURGERY     low back   .  CHOLECYSTECTOMY N/A 12/07/2013   Procedure: LAPAROSCOPIC CHOLECYSTECTOMY ;  Surgeon: [name], MD;  Location: [place];  Service: General;  Laterality: N/A;  . COLONOSCOPY    . CORONARY ARTERY BYPASS GRAFT  2004   [place]  . CORONARY STENT INTERVENTION N/A 05/21/2017   Procedure: Coronary Stent Intervention;  Surgeon: [name], MD;  Location: [place] [facility];  Service: Cardiovascular;  Laterality: N/A;  . ENDARTERECTOMY Right 12/10/2015   Procedure: Right Carotid ENDARTERECTOMY;  Surgeon: Conrad Sedgwick, MD;  Location: [place];  Service: Vascular;   Laterality: Right;  . EXPLORATION POST OPERATIVE OPEN HEART    . FINGER SURGERY     skin graft on rt index  . FINGER SURGERY Right    right index finger.  [name] HERNIA REPAIR Right    [facility]  . JOINT REPLACEMENT    . KNEE SURGERY     replacement on both knees  . LEFT HEART CATH AND CORS/GRAFTS ANGIOGRAPHY N/A 05/21/2017   Procedure: Left Heart Cath and Cors/Grafts Angiography;  Surgeon: [name], MD;  Location: [place] [facility];  Service: Cardiovascular;  Laterality: N/A;  . PROSTATE SURGERY    . PVC ABLATION N/A 10/27/2017   Procedure: PVC ABLATION;  Surgeon: Evans Lance, MD;  Location: [place] [facility];  Service: Cardiovascular;  Laterality: N/A;  . TONSILLECTOMY    . URINARY SURGERY     scar tissue    Current Outpatient Medications  Medication Sig Dispense Refill  . amiodarone (PACERONE) 200 MG tablet Take one tablet by mouth twice daily for one month then decrease to one tablet daily 60 tablet 0  . amLODipine (NORVASC) 10 MG tablet Take 1 tablet (10 mg total) by mouth daily. 30 tablet 11  . apixaban (ELIQUIS) 2.5 MG TABS tablet Take 1 tablet (2.5 mg total) by mouth 2 (two) times daily. (0800 & 2000) 180 tablet 3  . aspirin EC 81 MG tablet Take 1 tablet (81 mg total) by mouth daily. 90 tablet 3  . atorvastatin (LIPITOR) 40 MG tablet Take 40 mg by mouth at bedtime.     . Calcium-Magnesium-Zinc (CAL-MAG-ZINC PO) Take 1 tablet by mouth daily.    . Cholecalciferol (VITAMIN D-3) 25 MCG (1000 UT) CAPS Take 1,000 Units by mouth at bedtime.    . cloNIDine (CATAPRES) 0.1 MG tablet Take 1 tablet (0.1 mg total) by mouth 2 (two) times daily. 180 tablet 3  . Cyanocobalamin 1000 MCG/ML KIT Inject 1,000 mcg as directed every 30 (thirty) days.    [name] glucose blood test strip Accu-Check Guide Strips. Dx E11.9 Use to test blood glucose level 2 times a day. 50 each 11  . magnesium oxide (MAG-OX) 400 MG tablet Take 1 tablet (400 mg total) by mouth 4 (four) times daily. (Patient taking  differently: Take 800 mg by mouth 2 (two) times daily. ) 360 tablet 3  . metFORMIN (GLUCOPHAGE) 1000 MG tablet Take 1 tablet (1,000 mg total) by mouth 2 (two) times daily with a meal. Annual appt due in May must see provider for future refills 180 tablet 0  . metoprolol tartrate (LOPRESSOR) 25 MG tablet Take 1 tablet (25 mg total) by mouth 2 (two) times daily.    . nitroGLYCERIN (NITROSTAT) 0.4 MG SL tablet Place 1 tablet (0.4 mg total) under the tongue every 5 (five) minutes as needed for chest pain (x 3 doses). 30 tablet 3  . pantoprazole (PROTONIX) 40 MG tablet Take 1 tablet (40 mg total) by mouth  daily. 90 tablet 3  . terazosin (HYTRIN) 1 MG capsule Take 1 mg by mouth at bedtime.  30 capsule 11  . vitamin C (ASCORBIC ACID) 500 MG tablet Take 1,000 mg by mouth daily with breakfast.     . Zinc Acetate, Oral, (ZINC ACETATE PO) Take 1 tablet by mouth daily.     No current facility-administered medications for this encounter.    Allergies  Allergen Reactions  . Lisinopril Swelling and Other (See Comments)    Angioedema.   . Losartan Swelling and Other (See Comments)    Lips swell Angioedema   . Nifedipine Swelling and Other (See Comments)    Sugar increase  . Triamterene Swelling and Other (See Comments)    Face swells, no breathing impairment  . Hydralazine Hcl Other (See Comments)    Fatigue; poor appetite  . Losartan Potassium-Hctz Swelling and Other (See Comments)  . Amoxicillin Rash and Other (See Comments)    Did it involve swelling of the face/tongue/throat, SOB, or low BP? No Did it involve sudden or severe rash/hives, skin peeling, or any reaction on the inside of your mouth or nose? No Did you need to seek medical attention at a hospital or doctor's office? No When did it last happen?10 + years If all above answers are "NO", may proceed with cephalosporin use.    . Ampicillin Rash and Other (See Comments)    Did it involve swelling of the face/tongue/throat, SOB, or  low BP? No Did it involve sudden or severe rash/hives, skin peeling, or any reaction on the inside of your mouth or nose? No Did you need to seek medical attention at a hospital or doctor's office? No When did it last happen?10 + years If all above answers are "NO", may proceed with cephalosporin use.   . Carvedilol Other (See Comments)    Low heart rate     Social History   Socioeconomic History  . Marital status: Married    Spouse name: Not on file  . Number of children: 2  . Years of education: Not on file  . Highest education level: Not on file  Occupational History  . Occupation: retired  Tobacco Use  . Smoking status: Former Smoker    Packs/day: 1.00    Years: 13.00    Pack years: 13.00    Quit date: 11/30/1970    Years since quitting: 49.2  . Smokeless tobacco: Never Used  Substance and Sexual Activity  . Alcohol use: No    Alcohol/week: 0.0 standard drinks  . Drug use: No  . Sexual activity: Yes  Other Topics Concern  . Not on file  Social History Narrative  . Not on file   Social Determinants of Health   Financial Resource Strain:   . Difficulty of Paying Living Expenses:   Food Insecurity:   . Worried About Charity fundraiser in the Last Year:   . Arboriculturist in the Last Year:   Transportation Needs:   . Film/video editor (Medical):   [name] Lack of Transportation (Non-Medical):   Physical Activity: Sufficiently Active  . Days of Exercise per Week: 4 days  . Minutes of Exercise per Session: 40 min  Stress:   . Feeling of Stress :   Social Connections:   . Frequency of Communication with Friends and Family:   . Frequency of Social Gatherings with Friends and Family:   . Attends Religious Services:   . Active Member of Clubs or Organizations:   .  Attends Archivist Meetings:   [name] Marital Status:   Intimate Partner Violence:   . Fear of Current or Ex-Partner:   . Emotionally Abused:   [name] Physically Abused:   . Sexually Abused:        ROS- All systems are reviewed and negative except as per the HPI above.  Physical Exam: Vitals:   02/08/20 0954  BP: (!) 130/56  Pulse: 70  Weight: 97.3 kg  Height: '6\' 2"'$  (1.88 m)    GEN- The patient is well appearing elderly male, alert and oriented x 3 today.   HEENT-head normocephalic, atraumatic, sclera clear, conjunctiva pink, hearing intact, trachea midline. Lungs- Clear to ausculation bilaterally, normal work of breathing Heart- irregular rate and rhythm, no murmurs, rubs or gallops  GI- soft, NT, ND, + BS Extremities- no clubbing, cyanosis. Trace bilateral edema MS- no significant deformity or atrophy Skin- no rash or lesion Psych- euthymic mood, full affect Neuro- strength and sensation are intact   Wt Readings from Last 3 Encounters:  02/08/20 97.3 kg  02/07/20 94.3 kg  01/25/20 94.7 kg    EKG today demonstrates afib HR 70, RBBB, LAFB, QRS 144, QTc 432  Echo 09/29/17 demonstrated  Left ventricle: The cavity size was normal. There was moderate  focal basal hypertrophy of the septum with otherwise moderate  concentric hypertrophy. Systolic function was normal. The  estimated ejection fraction was in the range of 55% to 60%. Wall  motion was normal; there were no regional wall motion  abnormalities. The study is not technically sufficient to allow  evaluation of LV diastolic function.  - Aortic valve: Trileaflet; severely thickened, severely calcified  leaflets. The non-coronary cusp is most affected. Noncoronary  cusp mobility was severely restricted. Valve area (VTI): 2.39  cm^2. Valve area (Vmax): 2.36 cm^2. Valve area (Vmean): 1.85  cm^2.  - Mitral valve: Moderately calcified annulus. Transvalvular  velocity was within the normal range. There was no evidence for  stenosis. There was mild regurgitation.  - Left atrium: The atrium was moderately dilated.  - Right ventricle: The cavity size was normal. Wall thickness was   normal. Systolic function was normal.  - Atrial septum: No defect or patent foramen ovale was identified  by color flow Doppler.  - Tricuspid valve: There was trivial regurgitation.  - Pulmonary arteries: Systolic pressure was mildly increased. PA  peak pressure: 41 mm Hg (S).   Epic records are reviewed at length today  CHA2DS2-VASc Score = 7 The patient's score is based upon: CHF History: No HTN History: Yes Age : 60 + Diabetes History: Yes Stroke History: Yes Vascular Disease History: Yes Gender: Male   ASSESSMENT AND PLAN: 1. Persistent Atrial Fibrillation (ICD10:  I48.0) The patient's CHA2DS2-VASc score is 7, indicating a 11.2% annual risk of stroke.   Patient now appears in persistent rate controlled afib.  Continue amiodarone 200 mg BID. Will arrange for DCCV. Check CBC/Bmet today. Continue Eliquis 2.5 mg BID. Patient denies any missed doses in the last 3 weeks.  Continue Lopressor 25 mg BID  2. Secondary Hypercoagulable State (ICD10:  D68.69) The patient is at significant risk for stroke/thromboembolism based upon his CHA2DS2-VASc Score of 7.  Continue Apixaban (Eliquis).   3. PVC S/p ablation 2018.  4. CAD S/p CABG, DES to SVG in 2018. He is on ASA and statin.  5. HTN Stable, no changes today.  6. Dyspnea Patient's symptoms of dyspnea are similar to his symptoms prior to his PCI in 2018. Will d/w  Dr Burt Knack if ischemic evaluation is warranted at this point. Dyspnea could very well be from his afib.    Follow up in the AF clinic one week post DCCV.   [facility] [address] [place], [place] [address] [phone] 02/08/2020 11:08 AM

## 2020-02-08 NOTE — Progress Notes (Signed)
Primary Care Physician: [name], MD Primary Cardiologist: Dr Burt Knack Primary Electrophysiologist: Dr Lovena Le Referring Physician: [name] PA   Thomas Marshall is a 79 y.o. male with a history of CAD status post CABG, symptomatic PVCs status post ablation 2018, paroxymal atrial fibrillation on dofetilide, carotid disease status post right carotid endarterectomy, DM, small AAA, hypertension, CKD stage III-IV and HLD who presents for follow up in the [facility]. Patient is on Eliquis for a CHADS2VASC score of 7. Patient seen in follow up by [name] PA for several weeks of increasing dyspnea. Patient was found to be in atrial fibrillation and DCCV was scheduled. Patient presented in SR and the procedure was cancelled. Since then, patient reports that he has had more SOB and fatigue. He checks his heart rate on his home BP machine which has shown rates in the 70s-120s. Patient reports that he was briefly on amiodarone in 2004 after his CABG and he tolerated the medication well.  On follow up today, patient reports he is about the same as his last visit. He continues to have SOB and fatigue. His is in rate controlled afib today. He brings in a BP and heart rate log which show mostly controlled heart rates. He is tolerating the medication without difficulty. His weight by his home scale has remained stable.  Today, he denies symptoms of palpitations, chest pain, orthopnea, PND, lower extremity edema, dizziness, presyncope, syncope, snoring, daytime somnolence, bleeding, or neurologic sequela. The patient is tolerating medications without difficulties and is otherwise without complaint today.    Atrial Fibrillation Risk Factors:  he does not have symptoms or diagnosis of sleep apnea. he does not have a history of rheumatic fever.   he has a BMI of Body mass index is 27.55 kg/m.[name] Filed Weights   02/08/20 0954  Weight: 97.3 kg    Family History  Problem  Relation Age of Onset  . Stroke Mother   . Heart disease Mother   . Heart disease Father   . Hypertension Father   . Hyperlipidemia Father   . Cancer Sister        breast  and skin  . Diabetes Sister   . Hypertension Sister   . Diabetes Brother   . Heart disease Brother   . Hyperlipidemia Brother   . Hypertension Brother   . Early death Neg Hx      Atrial Fibrillation Management history:  Previous antiarrhythmic drugs: amiodarone, dofetilide Previous cardioversions: none Previous ablations: 2018 PVC CHADS2VASC score: 7 Anticoagulation history: Eliquis   Past Medical History:  Diagnosis Date  . AAA (abdominal aortic aneurysm) ([place])   . Arthritis   . Cancer (HCC)    skin & squamous cell  . Carotid artery disease ([place])    a. s/p R CEA 12/2015.  [name] CKD (chronic kidney disease), stage III    a. per historical labs.  . Coronary artery disease    a. s/p CABG 2004.  . Diabetes mellitus without complication ([place])   . GERD (gastroesophageal reflux disease)   . Hyperlipidemia   . Hypertension   . Medication intolerance Multiple   . PAF (paroxysmal atrial fibrillation) ([place])   . Pneumonia   . Sinus bradycardia    a. baseline HR 50s-60s, also h/o bradycardia on metoprolol and carvedilol   Past Surgical History:  Procedure Laterality Date  . ANKLE SURGERY     right fused  . BACK SURGERY     low back   .  CHOLECYSTECTOMY N/A 12/07/2013   Procedure: LAPAROSCOPIC CHOLECYSTECTOMY ;  Surgeon: [name], MD;  Location: [facility];  Service: General;  Laterality: N/A;  . COLONOSCOPY    . CORONARY ARTERY BYPASS GRAFT  2004   [place]  . CORONARY STENT INTERVENTION N/A 05/21/2017   Procedure: Coronary Stent Intervention;  Surgeon: [name], MD;  Location: [facility];  Service: Cardiovascular;  Laterality: N/A;  . ENDARTERECTOMY Right 12/10/2015   Procedure: Right Carotid ENDARTERECTOMY;  Surgeon: Conrad Big Creek, MD;  Location: [facility];  Service: Vascular;   Laterality: Right;  . EXPLORATION POST OPERATIVE OPEN HEART    . FINGER SURGERY     skin graft on rt index  . FINGER SURGERY Right    right index finger.  [name] HERNIA REPAIR Right    [facility]  . JOINT REPLACEMENT    . KNEE SURGERY     replacement on both knees  . LEFT HEART CATH AND CORS/GRAFTS ANGIOGRAPHY N/A 05/21/2017   Procedure: Left Heart Cath and Cors/Grafts Angiography;  Surgeon: [name], MD;  Location: [facility];  Service: Cardiovascular;  Laterality: N/A;  . PROSTATE SURGERY    . PVC ABLATION N/A 10/27/2017   Procedure: PVC ABLATION;  Surgeon: Evans Lance, MD;  Location: [facility];  Service: Cardiovascular;  Laterality: N/A;  . TONSILLECTOMY    . URINARY SURGERY     scar tissue    Current Outpatient Medications  Medication Sig Dispense Refill  . amiodarone (PACERONE) 200 MG tablet Take one tablet by mouth twice daily for one month then decrease to one tablet daily 60 tablet 0  . amLODipine (NORVASC) 10 MG tablet Take 1 tablet (10 mg total) by mouth daily. 30 tablet 11  . apixaban (ELIQUIS) 2.5 MG TABS tablet Take 1 tablet (2.5 mg total) by mouth 2 (two) times daily. (0800 & 2000) 180 tablet 3  . aspirin EC 81 MG tablet Take 1 tablet (81 mg total) by mouth daily. 90 tablet 3  . atorvastatin (LIPITOR) 40 MG tablet Take 40 mg by mouth at bedtime.     . Calcium-Magnesium-Zinc (CAL-MAG-ZINC PO) Take 1 tablet by mouth daily.    . Cholecalciferol (VITAMIN D-3) 25 MCG (1000 UT) CAPS Take 1,000 Units by mouth at bedtime.    . cloNIDine (CATAPRES) 0.1 MG tablet Take 1 tablet (0.1 mg total) by mouth 2 (two) times daily. 180 tablet 3  . Cyanocobalamin 1000 MCG/ML KIT Inject 1,000 mcg as directed every 30 (thirty) days.    [name] glucose blood test strip Accu-Check Guide Strips. Dx E11.9 Use to test blood glucose level 2 times a day. 50 each 11  . magnesium oxide (MAG-OX) 400 MG tablet Take 1 tablet (400 mg total) by mouth 4 (four) times daily. (Patient taking  differently: Take 800 mg by mouth 2 (two) times daily. ) 360 tablet 3  . metFORMIN (GLUCOPHAGE) 1000 MG tablet Take 1 tablet (1,000 mg total) by mouth 2 (two) times daily with a meal. Annual appt due in May must see provider for future refills 180 tablet 0  . metoprolol tartrate (LOPRESSOR) 25 MG tablet Take 1 tablet (25 mg total) by mouth 2 (two) times daily.    . nitroGLYCERIN (NITROSTAT) 0.4 MG SL tablet Place 1 tablet (0.4 mg total) under the tongue every 5 (five) minutes as needed for chest pain (x 3 doses). 30 tablet 3  . pantoprazole (PROTONIX) 40 MG tablet Take 1 tablet (40 mg total) by mouth  daily. 90 tablet 3  . terazosin (HYTRIN) 1 MG capsule Take 1 mg by mouth at bedtime.  30 capsule 11  . vitamin C (ASCORBIC ACID) 500 MG tablet Take 1,000 mg by mouth daily with breakfast.     . Zinc Acetate, Oral, (ZINC ACETATE PO) Take 1 tablet by mouth daily.     No current facility-administered medications for this encounter.    Allergies  Allergen Reactions  . Lisinopril Swelling and Other (See Comments)    Angioedema.   . Losartan Swelling and Other (See Comments)    Lips swell Angioedema   . Nifedipine Swelling and Other (See Comments)    Sugar increase  . Triamterene Swelling and Other (See Comments)    Face swells, no breathing impairment  . Hydralazine Hcl Other (See Comments)    Fatigue; poor appetite  . Losartan Potassium-Hctz Swelling and Other (See Comments)  . Amoxicillin Rash and Other (See Comments)    Did it involve swelling of the face/tongue/throat, SOB, or low BP? No Did it involve sudden or severe rash/hives, skin peeling, or any reaction on the inside of your mouth or nose? No Did you need to seek medical attention at a hospital or doctor's office? No When did it last happen?10 + years If all above answers are "NO", may proceed with cephalosporin use.    . Ampicillin Rash and Other (See Comments)    Did it involve swelling of the face/tongue/throat, SOB, or  low BP? No Did it involve sudden or severe rash/hives, skin peeling, or any reaction on the inside of your mouth or nose? No Did you need to seek medical attention at a hospital or doctor's office? No When did it last happen?10 + years If all above answers are "NO", may proceed with cephalosporin use.   . Carvedilol Other (See Comments)    Low heart rate     Social History   Socioeconomic History  . Marital status: Married    Spouse name: Not on file  . Number of children: 2  . Years of education: Not on file  . Highest education level: Not on file  Occupational History  . Occupation: retired  Tobacco Use  . Smoking status: Former Smoker    Packs/day: 1.00    Years: 13.00    Pack years: 13.00    Quit date: 11/30/1970    Years since quitting: 49.2  . Smokeless tobacco: Never Used  Substance and Sexual Activity  . Alcohol use: No    Alcohol/week: 0.0 standard drinks  . Drug use: No  . Sexual activity: Yes  Other Topics Concern  . Not on file  Social History Narrative  . Not on file   Social Determinants of Health   Financial Resource Strain:   . Difficulty of Paying Living Expenses:   Food Insecurity:   . Worried About Charity fundraiser in the Last Year:   . Arboriculturist in the Last Year:   Transportation Needs:   . Film/video editor (Medical):   [name] Lack of Transportation (Non-Medical):   Physical Activity: Sufficiently Active  . Days of Exercise per Week: 4 days  . Minutes of Exercise per Session: 40 min  Stress:   . Feeling of Stress :   Social Connections:   . Frequency of Communication with Friends and Family:   . Frequency of Social Gatherings with Friends and Family:   . Attends Religious Services:   . Active Member of Clubs or Organizations:   .  Attends Archivist Meetings:   [name] Marital Status:   Intimate Partner Violence:   . Fear of Current or Ex-Partner:   . Emotionally Abused:   [name] Physically Abused:   . Sexually Abused:       ROS- All systems are reviewed and negative except as per the HPI above.  Physical Exam: Vitals:   02/08/20 0954  BP: (!) 130/56  Pulse: 70  Weight: 97.3 kg  Height: '6\' 2"'$  (1.88 m)    GEN- The patient is well appearing elderly male, alert and oriented x 3 today.   HEENT-head normocephalic, atraumatic, sclera clear, conjunctiva pink, hearing intact, trachea midline. Lungs- Clear to ausculation bilaterally, normal work of breathing Heart- irregular rate and rhythm, no murmurs, rubs or gallops  GI- soft, NT, ND, + BS Extremities- no clubbing, cyanosis. Trace bilateral edema MS- no significant deformity or atrophy Skin- no rash or lesion Psych- euthymic mood, full affect Neuro- strength and sensation are intact   Wt Readings from Last 3 Encounters:  02/08/20 97.3 kg  02/07/20 94.3 kg  01/25/20 94.7 kg    EKG today demonstrates afib HR 70, RBBB, LAFB, QRS 144, QTc 432  Echo 09/29/17 demonstrated  Left ventricle: The cavity size was normal. There was moderate  focal basal hypertrophy of the septum with otherwise moderate  concentric hypertrophy. Systolic function was normal. The  estimated ejection fraction was in the range of 55% to 60%. Wall  motion was normal; there were no regional wall motion  abnormalities. The study is not technically sufficient to allow  evaluation of LV diastolic function.  - Aortic valve: Trileaflet; severely thickened, severely calcified  leaflets. The non-coronary cusp is most affected. Noncoronary  cusp mobility was severely restricted. Valve area (VTI): 2.39  cm^2. Valve area (Vmax): 2.36 cm^2. Valve area (Vmean): 1.85  cm^2.  - Mitral valve: Moderately calcified annulus. Transvalvular  velocity was within the normal range. There was no evidence for  stenosis. There was mild regurgitation.  - Left atrium: The atrium was moderately dilated.  - Right ventricle: The cavity size was normal. Wall thickness was   normal. Systolic function was normal.  - Atrial septum: No defect or patent foramen ovale was identified  by color flow Doppler.  - Tricuspid valve: There was trivial regurgitation.  - Pulmonary arteries: Systolic pressure was mildly increased. PA  peak pressure: 41 mm Hg (S).   Epic records are reviewed at length today  CHA2DS2-VASc Score = 7 The patient's score is based upon: CHF History: No HTN History: Yes Age : 37 + Diabetes History: Yes Stroke History: Yes Vascular Disease History: Yes Gender: Male   ASSESSMENT AND PLAN: 1. Persistent Atrial Fibrillation (ICD10:  I48.0) The patient's CHA2DS2-VASc score is 7, indicating a 11.2% annual risk of stroke.   Patient now appears in persistent rate controlled afib.  Continue amiodarone 200 mg BID. Will arrange for DCCV. Check CBC/Bmet today. Continue Eliquis 2.5 mg BID. Patient denies any missed doses in the last 3 weeks.  Continue Lopressor 25 mg BID  2. Secondary Hypercoagulable State (ICD10:  D68.69) The patient is at significant risk for stroke/thromboembolism based upon his CHA2DS2-VASc Score of 7.  Continue Apixaban (Eliquis).   3. PVC S/p ablation 2018.  4. CAD S/p CABG, DES to SVG in 2018. He is on ASA and statin.  5. HTN Stable, no changes today.  6. Dyspnea Patient's symptoms of dyspnea are similar to his symptoms prior to his PCI in 2018. Will d/w Dr  Cooper if ischemic evaluation is warranted at this point. Dyspnea could very well be from his afib.    Follow up in the AF clinic one week post DCCV.   [facility] [address] [place], [place] [address] [phone] 02/08/2020 11:08 AM

## 2020-02-08 NOTE — Patient Instructions (Signed)
Cardioversion scheduled for Wednesday, March 17th  - Arrive at the [facility] and go to admitting at [facility] not eat or drink anything after midnight the night prior to your procedure.  - Take all your morning medication with a sip of water prior to arrival.  - You will not be able to drive home after your procedure.

## 2020-02-09 ENCOUNTER — Other Ambulatory Visit: Payer: Self-pay

## 2020-02-09 ENCOUNTER — Other Ambulatory Visit: Payer: Medicare Other | Admitting: Orthotics

## 2020-02-10 ENCOUNTER — Other Ambulatory Visit (HOSPITAL_COMMUNITY)
Admission: RE | Admit: 2020-02-10 | Discharge: 2020-02-10 | Disposition: A | Discharge status: Home / Self Care | Payer: Medicare Other | Source: Ambulatory Visit | Attending: Cardiovascular Disease | Admitting: Cardiovascular Disease

## 2020-02-10 DIAGNOSIS — Z01812 Encounter for preprocedural laboratory examination: Secondary | ICD-10-CM | POA: Insufficient documentation

## 2020-02-10 DIAGNOSIS — Z20822 Contact with and (suspected) exposure to covid-19: Secondary | ICD-10-CM | POA: Diagnosis not present

## 2020-02-10 LAB — SARS CORONAVIRUS 2 (TAT 6-24 HRS): SARS Coronavirus 2: NEGATIVE

## 2020-02-13 ENCOUNTER — Ambulatory Visit: Payer: Medicare Other | Admitting: Physician Assistant

## 2020-02-14 ENCOUNTER — Ambulatory Visit (HOSPITAL_COMMUNITY): Payer: Medicare Other | Admitting: Certified Registered Nurse Anesthetist

## 2020-02-14 ENCOUNTER — Other Ambulatory Visit: Payer: Self-pay

## 2020-02-14 ENCOUNTER — Encounter (HOSPITAL_COMMUNITY)
Admission: RE | Disposition: A | Discharge status: Home / Self Care | Payer: Self-pay | Source: Home / Self Care | Attending: Cardiovascular Disease

## 2020-02-14 ENCOUNTER — Ambulatory Visit (HOSPITAL_COMMUNITY)
Admission: RE | Admit: 2020-02-14 | Discharge: 2020-02-14 | Disposition: A | Discharge status: Home / Self Care | Payer: Medicare Other | Attending: Cardiovascular Disease | Admitting: Cardiovascular Disease

## 2020-02-14 ENCOUNTER — Encounter (HOSPITAL_COMMUNITY): Payer: Self-pay | Admitting: Cardiovascular Disease

## 2020-02-14 ENCOUNTER — Telehealth: Payer: Self-pay | Admitting: Cardiovascular Disease

## 2020-02-14 DIAGNOSIS — I2510 Atherosclerotic heart disease of native coronary artery without angina pectoris: Secondary | ICD-10-CM | POA: Diagnosis not present

## 2020-02-14 DIAGNOSIS — Z7901 Long term (current) use of anticoagulants: Secondary | ICD-10-CM | POA: Diagnosis not present

## 2020-02-14 DIAGNOSIS — I129 Hypertensive chronic kidney disease with stage 1 through stage 4 chronic kidney disease, or unspecified chronic kidney disease: Secondary | ICD-10-CM | POA: Diagnosis not present

## 2020-02-14 DIAGNOSIS — Z87891 Personal history of nicotine dependence: Secondary | ICD-10-CM | POA: Insufficient documentation

## 2020-02-14 DIAGNOSIS — E1122 Type 2 diabetes mellitus with diabetic chronic kidney disease: Secondary | ICD-10-CM | POA: Insufficient documentation

## 2020-02-14 DIAGNOSIS — Z7982 Long term (current) use of aspirin: Secondary | ICD-10-CM | POA: Insufficient documentation

## 2020-02-14 DIAGNOSIS — R0609 Other forms of dyspnea: Secondary | ICD-10-CM | POA: Diagnosis not present

## 2020-02-14 DIAGNOSIS — I4819 Other persistent atrial fibrillation: Secondary | ICD-10-CM | POA: Insufficient documentation

## 2020-02-14 DIAGNOSIS — I714 Abdominal aortic aneurysm, without rupture: Secondary | ICD-10-CM | POA: Insufficient documentation

## 2020-02-14 DIAGNOSIS — E785 Hyperlipidemia, unspecified: Secondary | ICD-10-CM | POA: Insufficient documentation

## 2020-02-14 DIAGNOSIS — N184 Chronic kidney disease, stage 4 (severe): Secondary | ICD-10-CM | POA: Insufficient documentation

## 2020-02-14 DIAGNOSIS — Z7984 Long term (current) use of oral hypoglycemic drugs: Secondary | ICD-10-CM | POA: Diagnosis not present

## 2020-02-14 DIAGNOSIS — Z951 Presence of aortocoronary bypass graft: Secondary | ICD-10-CM | POA: Diagnosis not present

## 2020-02-14 DIAGNOSIS — N183 Chronic kidney disease, stage 3 unspecified: Secondary | ICD-10-CM | POA: Diagnosis not present

## 2020-02-14 DIAGNOSIS — Z79899 Other long term (current) drug therapy: Secondary | ICD-10-CM | POA: Insufficient documentation

## 2020-02-14 DIAGNOSIS — I4891 Unspecified atrial fibrillation: Secondary | ICD-10-CM

## 2020-02-14 HISTORY — PX: CARDIOVERSION: SHX1299

## 2020-02-14 LAB — GLUCOSE, CAPILLARY: Glucose-Capillary: 94 mg/dL (ref 70–99)

## 2020-02-14 SURGERY — CARDIOVERSION
Anesthesia: General

## 2020-02-14 MED ORDER — PHENYLEPHRINE 40 MCG/ML (10ML) SYRINGE FOR IV PUSH (FOR BLOOD PRESSURE SUPPORT)
PREFILLED_SYRINGE | INTRAVENOUS | Status: DC | PRN
  Administered 2020-02-14: 80 ug via INTRAVENOUS

## 2020-02-14 MED ORDER — LIDOCAINE 2% (20 MG/ML) 5 ML SYRINGE
INTRAMUSCULAR | Status: DC | PRN
  Administered 2020-02-14: 60 mg via INTRAVENOUS

## 2020-02-14 MED ORDER — SODIUM CHLORIDE 0.9 % IV SOLN: INTRAVENOUS | Status: DC | PRN

## 2020-02-14 MED ORDER — PROPOFOL 10 MG/ML IV BOLUS
INTRAVENOUS | Status: DC | PRN
  Administered 2020-02-14: 30 mg via INTRAVENOUS
  Administered 2020-02-14: 60 mg via INTRAVENOUS

## 2020-02-14 MED ORDER — LACTATED RINGERS IV SOLN
INTRAVENOUS | Status: AC | PRN
  Administered 2020-02-14: 500 mL via INTRAVENOUS

## 2020-02-14 NOTE — Discharge Instructions (Signed)
Electrical Cardioversion   What can I expect after the procedure?  Your blood pressure, heart rate, breathing rate, and blood oxygen level will be monitored until you leave the hospital or clinic.  Your heart rhythm will be watched to make sure it does not change.  You may have some redness on the skin where the shocks were given. Follow these instructions at home:  Do not drive for 24 hours if you were given a sedative during your procedure.  Take over-the-counter and prescription medicines only as told by your health care provider.  Ask your health care provider how to check your pulse. Check it often.  Rest for 48 hours after the procedure or as told by your health care provider.  Avoid or limit your caffeine use as told by your health care provider.  Keep all follow-up visits as told by your health care provider. This is important. Contact a health care provider if:  You feel like your heart is beating too quickly or your pulse is not regular.  You have a serious muscle cramp that does not go away. Get help right away if:  You have discomfort in your chest.  You are dizzy or you feel faint.  You have trouble breathing or you are short of breath.  Your speech is slurred.  You have trouble moving an arm or leg on one side of your body.  Your fingers or toes turn cold or blue. Summary  Electrical cardioversion is the delivery of a jolt of electricity to restore a normal rhythm to the heart.  This procedure may be done right away in an emergency or may be a scheduled procedure if the condition is not an emergency.  Generally, this is a safe procedure.  After the procedure, check your pulse often as told by your health care provider. This information is not intended to replace advice given to you by your health care provider. Make sure you discuss any questions you have with your health care provider. Document Revised: 06/19/2019 Document Reviewed:  06/19/2019 Elsevier Patient Education  2020 Elsevier Inc.  

## 2020-02-14 NOTE — Telephone Encounter (Signed)
  Pt's wife called, she said pt has been on Afib lately, BP low as well his HR. Pt was brought to the hospital and will be discharge today. She said they really need to see Dr. Burt Knack since pt is not getting any better and the hospital wants to put him on more medications.  Please call

## 2020-02-14 NOTE — Anesthesia Procedure Notes (Signed)
Procedure Name: General with mask airway Date/Time: 02/14/2020 9:51 AM Performed by: Janene Harvey, CRNA Pre-anesthesia Checklist: Patient identified, Emergency Drugs available, Suction available and Patient being monitored Patient Re-evaluated:Patient Re-evaluated prior to induction Oxygen Delivery Method: Ambu bag Dental Injury: Teeth and Oropharynx as per pre-operative assessment

## 2020-02-14 NOTE — Anesthesia Preprocedure Evaluation (Addendum)
Anesthesia Evaluation  Patient identified by MRN, date of birth, ID band Patient awake    Reviewed: Allergy & Precautions, NPO status , Patient's Chart, lab work & pertinent test results  History of Anesthesia Complications Negative for: history of anesthetic complications  Airway Mallampati: II  TM Distance: >3 FB Neck ROM: Full    Dental  (+) Edentulous Lower, Edentulous Upper   Pulmonary former smoker,    Pulmonary exam normal        Cardiovascular hypertension, Pt. on medications and Pt. on home beta blockers + CAD, + Cardiac Stents, + CABG and + Peripheral Vascular Disease  + dysrhythmias Atrial Fibrillation  Rhythm:Irregular Rate:Normal   '21 Carotid US - 60-79% left ICAS, patent right carotid s/p CEA  '18 TTE - Moderatefocal basal hypertrophy of the septum with otherwise moderate concentric hypertrophy. EF 55% to 60%. Trileaflet AV; severely thickened, severely calcified leaflets without AS. The non-coronary cusp is most affected. Noncoronary cusp mobility was severely restricted. Valve area (VTI): 2.39 cm^2. Mild MR. LA was moderately dilated. Trivial TR. PASP was mildly increased.     Neuro/Psych TIAnegative psych ROS   GI/Hepatic Neg liver ROS, GERD  Medicated and Controlled,  Endo/Other  diabetes, Type 2, Oral Hypoglycemic Agents K 5.3   Renal/GU CRFRenal disease     Musculoskeletal  (+) Arthritis ,   Abdominal   Peds  Hematology  (+) anemia ,   Anesthesia Other Findings Covid neg 02/10/20   Reproductive/Obstetrics                           Anesthesia Physical Anesthesia Plan  ASA: III  Anesthesia Plan: General   Post-op Pain Management:    Induction: Intravenous  PONV Risk Score and Plan: 2 and Treatment may vary due to age or medical condition and Propofol infusion  Airway Management Planned: Mask and Natural Airway  Additional Equipment: None  Intra-op Plan:    Post-operative Plan:   Informed Consent: I have reviewed the patients History and Physical, chart, labs and discussed the procedure including the risks, benefits and alternatives for the proposed anesthesia with the patient or authorized representative who has indicated his/her understanding and acceptance.       Plan Discussed with: CRNA and Anesthesiologist  Anesthesia Plan Comments:        Anesthesia Quick Evaluation

## 2020-02-14 NOTE — CV Procedure (Signed)
   DIRECT CURRENT CARDIOVERSION  NAME:  Thomas Marshall    MRN: [id number] DOB:  [birth year]    ADMIT DATE: 02/14/2020  Indication:  Symptomatic atrial fibrillation  Procedure Note:  The patient signed informed consent.  They have had had therapeutic anticoagulation with eliquis greater than 3 weeks.  Anesthesia was administered by Dr. Fransisco Beau.  Adequate airway was maintained throughout and vital followed per protocol.  They were cardioverted x 2 with 200J of biphasic synchronized energy. Both attempts were unsuccessful and the patient remained in Afib. There were no apparent complications.  The patient had normal neuro status and respiratory status post procedure with vitals stable as recorded elsewhere.    Follow up: They will continue on current medical therapy and follow up with cardiology as scheduled.  Thomas Marshall, [place]  [address] [place], [place] [address] [phone]  10:08 AM

## 2020-02-14 NOTE — Interval H&P Note (Signed)
History and Physical Interval Note:  02/14/2020 9:32 AM  Thomas Marshall L Hollan  has presented today for surgery, with the diagnosis of A-FIB.  The various methods of treatment have been discussed with the patient and family. After consideration of risks, benefits and other options for treatment, the patient has consented to  Procedure(s): CARDIOVERSION (N/A) as a surgical intervention.  The patient's history has been reviewed, patient examined, no change in status, stable for surgery.  I have reviewed the patient's chart and labs.  Questions were answered to the patient's satisfaction.     Symptomatic Afib. No missed doses of eliquis. DCCV today.  [place] T. Audie Box, [place]  [address], [place], [place] [address] [phone]  9:32 AM

## 2020-02-14 NOTE — Telephone Encounter (Signed)
Will send call to both A-fib Clinic as well as Dr. Antionette Char nurse Valetta Fuller. Pt has appt with A-fib clinic 02/21/20.

## 2020-02-14 NOTE — Telephone Encounter (Signed)
Thomas Peals PA spoke with the patient over the phone - reassured patient that should continue to load amiodarone and will attempt to cardiovert again which amiodarone has loaded for 1 month. Patient is well rate controlled. Offered to have patient follow up with [name] but patient states he would keep follow up as planned in afib clinic. Patient did not need anything further at this time.

## 2020-02-14 NOTE — Anesthesia Postprocedure Evaluation (Signed)
Anesthesia Post Note  Patient: Thomas Marshall  Procedure(s) Performed: CARDIOVERSION (N/A )     Patient location during evaluation: PACU Anesthesia Type: General Level of consciousness: awake and alert Pain management: pain level controlled Vital Signs Assessment: post-procedure vital signs reviewed and stable Respiratory status: spontaneous breathing, nonlabored ventilation and respiratory function stable Cardiovascular status: blood pressure returned to baseline and stable Postop Assessment: no apparent nausea or vomiting Anesthetic complications: no    Last Vitals:  Vitals:   02/14/20 1011 02/14/20 1015  BP: (!) 108/58 113/61  Pulse: 62 (!) 57  Resp: 17 11  Temp:    SpO2: 100% 100%    Last Pain:  Vitals:   02/14/20 1015  TempSrc:   PainSc: 0-No pain                 Audry Pili

## 2020-02-14 NOTE — Transfer of Care (Signed)
Immediate Anesthesia Transfer of Care Note  Patient: Thomas Marshall  Procedure(s) Performed: CARDIOVERSION (N/A )  Patient Location: Endoscopy Unit  Anesthesia Type:General  Level of Consciousness: drowsy  Airway & Oxygen Therapy: Patient Spontanous Breathing and Patient connected to nasal cannula oxygen  Post-op Assessment: Report given to RN and Post -op Vital signs reviewed and stable  Post vital signs: Reviewed  Last Vitals:  Vitals Value Taken Time  BP 112/61 02/14/20 1005  Temp    Pulse 153 02/14/20 1005  Resp 24 02/14/20 1005  SpO2 100 % 02/14/20 1005  Vitals shown include unvalidated device data.  Last Pain:  Vitals:   02/14/20 0848  TempSrc: Oral  PainSc: 0-No pain         Complications: No apparent anesthesia complications

## 2020-02-16 NOTE — Telephone Encounter (Signed)
Spoke to pt.  States he didn't feel good yesterday, still fatigued, "don't feel like doing anything", and if I do anything my heart starts racing".  He reports that DCCV earlier this week didn't work, they could not get him back into rhythm. He is not currently light headed/dizzy. Discussed the difference in his two cardiologist Burt Knack and Lovena Le.  Pt aware that afib issues should go through [place]. Reports he is overdue to see Dr. Burt Knack and that is why they called and made appointment. Aware that I will forward this to [place] to address when she returns to office next week.  Aware that appt made may not be appropriate day and nurse may have to reschedule him if so. Advised to call office if AFib symptoms worsen but to give Amiodarone time to work as instructed by Adline Peals, PA two days ago. Patient verbalized understanding and agreeable to plan.

## 2020-02-16 NOTE — Telephone Encounter (Signed)
Patient c/o Palpitations:  High priority if patient c/o lightheadedness, shortness of breath, or chest pain  1) How long have you had palpitations/irregular HR/ Afib? Are you having the symptoms now? afib  2) Are you currently experiencing lightheadedness, SOB or CP? Not now but states that yesterday he had really bad SOB and lightheadedness.   3) Do you have a history of afib (atrial fibrillation) or irregular heart rhythm? yes  4) Have you checked your BP or HR? (document readings if available): 118/64 HR 82 but states it can be all over the place.  5) Are you experiencing any other symptoms?  fatigue   Patient states that she is upset she has not heard anything. Spoke to Dr. Antionette Char scheduler who said to schedule for April 15th for now and add to waitlist. Patient scheduled for 03/14/20 at 8:40am. Patient is aware Dr. Burt Knack nor his nurse are in the office.

## 2020-02-21 ENCOUNTER — Ambulatory Visit (HOSPITAL_COMMUNITY)
Admission: RE | Admit: 2020-02-21 | Discharge: 2020-02-21 | Disposition: A | Discharge status: Home / Self Care | Payer: Medicare Other | Source: Ambulatory Visit | Attending: Physician Assistant | Admitting: Physician Assistant

## 2020-02-21 ENCOUNTER — Other Ambulatory Visit: Payer: Self-pay

## 2020-02-21 ENCOUNTER — Encounter (HOSPITAL_COMMUNITY): Payer: Self-pay | Admitting: Physician Assistant

## 2020-02-21 VITALS — BP 118/50 | HR 75 | Ht 74.0 in | Wt 213.4 lb

## 2020-02-21 DIAGNOSIS — Z7982 Long term (current) use of aspirin: Secondary | ICD-10-CM | POA: Insufficient documentation

## 2020-02-21 DIAGNOSIS — Z833 Family history of diabetes mellitus: Secondary | ICD-10-CM | POA: Diagnosis not present

## 2020-02-21 DIAGNOSIS — I129 Hypertensive chronic kidney disease with stage 1 through stage 4 chronic kidney disease, or unspecified chronic kidney disease: Secondary | ICD-10-CM | POA: Diagnosis not present

## 2020-02-21 DIAGNOSIS — Z888 Allergy status to other drugs, medicaments and biological substances status: Secondary | ICD-10-CM | POA: Diagnosis not present

## 2020-02-21 DIAGNOSIS — D631 Anemia in chronic kidney disease: Secondary | ICD-10-CM | POA: Diagnosis not present

## 2020-02-21 DIAGNOSIS — E1122 Type 2 diabetes mellitus with diabetic chronic kidney disease: Secondary | ICD-10-CM | POA: Diagnosis not present

## 2020-02-21 DIAGNOSIS — Z79899 Other long term (current) drug therapy: Secondary | ICD-10-CM | POA: Insufficient documentation

## 2020-02-21 DIAGNOSIS — Z823 Family history of stroke: Secondary | ICD-10-CM | POA: Insufficient documentation

## 2020-02-21 DIAGNOSIS — Z7984 Long term (current) use of oral hypoglycemic drugs: Secondary | ICD-10-CM | POA: Diagnosis not present

## 2020-02-21 DIAGNOSIS — E785 Hyperlipidemia, unspecified: Secondary | ICD-10-CM | POA: Diagnosis not present

## 2020-02-21 DIAGNOSIS — Z8249 Family history of ischemic heart disease and other diseases of the circulatory system: Secondary | ICD-10-CM | POA: Diagnosis not present

## 2020-02-21 DIAGNOSIS — I2510 Atherosclerotic heart disease of native coronary artery without angina pectoris: Secondary | ICD-10-CM | POA: Diagnosis not present

## 2020-02-21 DIAGNOSIS — Z880 Allergy status to penicillin: Secondary | ICD-10-CM | POA: Diagnosis not present

## 2020-02-21 DIAGNOSIS — I493 Ventricular premature depolarization: Secondary | ICD-10-CM | POA: Insufficient documentation

## 2020-02-21 DIAGNOSIS — I714 Abdominal aortic aneurysm, without rupture: Secondary | ICD-10-CM | POA: Insufficient documentation

## 2020-02-21 DIAGNOSIS — Z85828 Personal history of other malignant neoplasm of skin: Secondary | ICD-10-CM | POA: Insufficient documentation

## 2020-02-21 DIAGNOSIS — K219 Gastro-esophageal reflux disease without esophagitis: Secondary | ICD-10-CM | POA: Insufficient documentation

## 2020-02-21 DIAGNOSIS — Z951 Presence of aortocoronary bypass graft: Secondary | ICD-10-CM | POA: Insufficient documentation

## 2020-02-21 DIAGNOSIS — N184 Chronic kidney disease, stage 4 (severe): Secondary | ICD-10-CM | POA: Insufficient documentation

## 2020-02-21 DIAGNOSIS — N1831 Chronic kidney disease, stage 3a: Secondary | ICD-10-CM | POA: Diagnosis not present

## 2020-02-21 DIAGNOSIS — Z87891 Personal history of nicotine dependence: Secondary | ICD-10-CM | POA: Insufficient documentation

## 2020-02-21 DIAGNOSIS — N179 Acute kidney failure, unspecified: Secondary | ICD-10-CM | POA: Diagnosis not present

## 2020-02-21 DIAGNOSIS — Z7901 Long term (current) use of anticoagulants: Secondary | ICD-10-CM | POA: Diagnosis not present

## 2020-02-21 DIAGNOSIS — R809 Proteinuria, unspecified: Secondary | ICD-10-CM | POA: Diagnosis not present

## 2020-02-21 DIAGNOSIS — I4819 Other persistent atrial fibrillation: Secondary | ICD-10-CM | POA: Diagnosis not present

## 2020-02-21 DIAGNOSIS — D6869 Other thrombophilia: Secondary | ICD-10-CM

## 2020-02-21 LAB — CBC
HCT: 34.6 % — ABNORMAL LOW (ref 39.0–52.0)
Hemoglobin: 10.6 g/dL — ABNORMAL LOW (ref 13.0–17.0)
MCH: 28.8 pg (ref 26.0–34.0)
MCHC: 30.6 g/dL (ref 30.0–36.0)
MCV: 94 fL (ref 80.0–100.0)
Platelets: 146 10*3/uL — ABNORMAL LOW (ref 150–400)
RBC: 3.68 MIL/uL — ABNORMAL LOW (ref 4.22–5.81)
RDW: 14.7 % (ref 11.5–15.5)
WBC: 6 10*3/uL (ref 4.0–10.5)
nRBC: 0 % (ref 0.0–0.2)

## 2020-02-21 LAB — BASIC METABOLIC PANEL
Anion gap: 11 (ref 5–15)
BUN: 30 mg/dL — ABNORMAL HIGH (ref 8–23)
CO2: 24 mmol/L (ref 22–32)
Calcium: 8.9 mg/dL (ref 8.9–10.3)
Chloride: 102 mmol/L (ref 98–111)
Creatinine, Ser: 2.11 mg/dL — ABNORMAL HIGH (ref 0.61–1.24)
GFR calc Af Amer: 33 mL/min — ABNORMAL LOW (ref >60)
GFR calc non Af Amer: 29 mL/min — ABNORMAL LOW (ref >60)
Glucose, Bld: 161 mg/dL — ABNORMAL HIGH (ref 70–99)
Potassium: 4.6 mmol/L (ref 3.5–5.1)
Sodium: 137 mmol/L (ref 135–145)

## 2020-02-21 NOTE — Progress Notes (Signed)
Primary Care Physician: [name], MD Primary Cardiologist: Dr Burt Knack Primary Electrophysiologist: Dr Lovena Le Referring Physician: [name] PA   Thomas Marshall is a 79 y.o. male with a history of CAD status post CABG, symptomatic PVCs status post ablation 2018, paroxymal atrial fibrillation on dofetilide, carotid disease status post right carotid endarterectomy, DM, small AAA, hypertension, CKD stage III-IV and HLD who presents for follow up in the [facility]. Patient is on Eliquis for a CHADS2VASC score of 7. Patient seen in follow up by [name] PA for several weeks of increasing dyspnea. Patient was found to be in atrial fibrillation and DCCV was scheduled. Patient presented in SR and the procedure was cancelled. Since then, patient reports that he has had more SOB and fatigue. He checks his heart rate on his home BP machine which has shown rates in the 70s-120s. Patient reports that he was briefly on amiodarone in 2004 after his CABG and he tolerated the medication well.  On follow up today, patient is s/p DCCV on 02/14/20 which was unsuccessful. He remains in rate controlled afib today. He has symptoms of dizziness, no presyncope or falls. He is tolerating the medication without difficulty. He denies any missed doses of anticoagulation.   Today, he denies symptoms of palpitations, chest pain, orthopnea, PND, lower extremity edema, presyncope, syncope, snoring, daytime somnolence, bleeding, or neurologic sequela. The patient is tolerating medications without difficulties and is otherwise without complaint today.    Atrial Fibrillation Risk Factors:  he does not have symptoms or diagnosis of sleep apnea. he does not have a history of rheumatic fever.   he has a BMI of Body mass index is 27.4 kg/m.[name] Filed Weights   02/21/20 0949  Weight: 96.8 kg    Family History  Problem Relation Age of Onset  . Stroke Mother   . Heart disease Mother   .  Heart disease Father   . Hypertension Father   . Hyperlipidemia Father   . Cancer Sister        breast  and skin  . Diabetes Sister   . Hypertension Sister   . Diabetes Brother   . Heart disease Brother   . Hyperlipidemia Brother   . Hypertension Brother   . Early death Neg Hx      Atrial Fibrillation Management history:  Previous antiarrhythmic drugs: amiodarone, dofetilide Previous cardioversions: 02/14/20 Previous ablations: 2018 PVC CHADS2VASC score: 7 Anticoagulation history: Eliquis   Past Medical History:  Diagnosis Date  . AAA (abdominal aortic aneurysm) ([place])   . Arthritis   . Cancer (HCC)    skin & squamous cell  . Carotid artery disease ([place])    a. s/p R CEA 12/2015.  [name] CKD (chronic kidney disease), stage III    a. per historical labs.  . Coronary artery disease    a. s/p CABG 2004.  . Diabetes mellitus without complication ([place])   . GERD (gastroesophageal reflux disease)   . Hyperlipidemia   . Hypertension   . Medication intolerance Multiple   . PAF (paroxysmal atrial fibrillation) ([place])   . Pneumonia   . Sinus bradycardia    a. baseline HR 50s-60s, also h/o bradycardia on metoprolol and carvedilol   Past Surgical History:  Procedure Laterality Date  . ANKLE SURGERY     right fused  . BACK SURGERY     low back   . CARDIOVERSION N/A 02/14/2020   Procedure: CARDIOVERSION;  Surgeon: Geralynn Rile, MD;  Location: [facility];  Service: Cardiovascular;  Laterality: N/A;  . CHOLECYSTECTOMY N/A 12/07/2013   Procedure: LAPAROSCOPIC CHOLECYSTECTOMY ;  Surgeon: [name], MD;  Location: [facility];  Service: General;  Laterality: N/A;  . COLONOSCOPY    . CORONARY ARTERY BYPASS GRAFT  2004   [place]  . CORONARY STENT INTERVENTION N/A 05/21/2017   Procedure: Coronary Stent Intervention;  Surgeon: [name], MD;  Location: [facility];  Service: Cardiovascular;  Laterality: N/A;  . ENDARTERECTOMY Right 12/10/2015   Procedure: Right  Carotid ENDARTERECTOMY;  Surgeon: Conrad Largo, MD;  Location: [facility];  Service: Vascular;  Laterality: Right;  . EXPLORATION POST OPERATIVE OPEN HEART    . FINGER SURGERY     skin graft on rt index  . FINGER SURGERY Right    right index finger.  [name] HERNIA REPAIR Right    [facility]  . JOINT REPLACEMENT    . KNEE SURGERY     replacement on both knees  . LEFT HEART CATH AND CORS/GRAFTS ANGIOGRAPHY N/A 05/21/2017   Procedure: Left Heart Cath and Cors/Grafts Angiography;  Surgeon: [name], MD;  Location: [facility];  Service: Cardiovascular;  Laterality: N/A;  . PROSTATE SURGERY    . PVC ABLATION N/A 10/27/2017   Procedure: PVC ABLATION;  Surgeon: Evans Lance, MD;  Location: [facility];  Service: Cardiovascular;  Laterality: N/A;  . TONSILLECTOMY    . URINARY SURGERY     scar tissue    Current Outpatient Medications  Medication Sig Dispense Refill  . amiodarone (PACERONE) 200 MG tablet Take one tablet by mouth twice daily for one month then decrease to one tablet daily (Patient taking differently: Take 200 mg by mouth See admin instructions. Take 200 mg by mouth twice daily for one month then decrease to 200 mg daily) 60 tablet 0  . apixaban (ELIQUIS) 2.5 MG TABS tablet Take 1 tablet (2.5 mg total) by mouth 2 (two) times daily. (0800 & 2000) 180 tablet 3  . aspirin EC 81 MG tablet Take 1 tablet (81 mg total) by mouth daily. 90 tablet 3  . atorvastatin (LIPITOR) 40 MG tablet Take 40 mg by mouth at bedtime.     . Calcium-Magnesium-Zinc (CAL-MAG-ZINC PO) Take 1 tablet by mouth daily.    . Cholecalciferol (VITAMIN D-3) 25 MCG (1000 UT) CAPS Take 1,000 Units by mouth at bedtime.    . cloNIDine (CATAPRES) 0.1 MG tablet Take 1 tablet (0.1 mg total) by mouth 2 (two) times daily. 180 tablet 3  . Cyanocobalamin 1000 MCG/ML KIT Inject 1,000 mcg as directed every 30 (thirty) days.    [name] glucose blood test strip Accu-Check Guide Strips. Dx E11.9 Use to test blood glucose level 2  times a day. 50 each 11  . magnesium oxide (MAG-OX) 400 MG tablet Take 1 tablet (400 mg total) by mouth 4 (four) times daily. (Patient taking differently: Take 800 mg by mouth 2 (two) times daily. ) 360 tablet 3  . metFORMIN (GLUCOPHAGE) 1000 MG tablet Take 1 tablet (1,000 mg total) by mouth 2 (two) times daily with a meal. Annual appt due in May must see provider for future refills 180 tablet 0  . metoprolol tartrate (LOPRESSOR) 25 MG tablet Take 1 tablet (25 mg total) by mouth 2 (two) times daily. (Patient taking differently: Take 12.5 mg by mouth 2 (two) times daily. )    . nitroGLYCERIN (NITROSTAT) 0.4 MG SL tablet Place 1 tablet (0.4 mg total) under  the tongue every 5 (five) minutes as needed for chest pain (x 3 doses). 30 tablet 3  . pantoprazole (PROTONIX) 40 MG tablet Take 1 tablet (40 mg total) by mouth daily. 90 tablet 3  . terazosin (HYTRIN) 1 MG capsule Take 1 mg by mouth at bedtime.  30 capsule 11  . vitamin C (ASCORBIC ACID) 500 MG tablet Take 500 mg by mouth daily with breakfast.     . Zinc 22.5 MG TABS Take 22.5 mg by mouth every other day.     No current facility-administered medications for this encounter.    Allergies  Allergen Reactions  . Lisinopril Swelling and Other (See Comments)    Angioedema.   . Losartan Swelling and Other (See Comments)    Lips swell Angioedema   . Nifedipine Swelling and Other (See Comments)    Sugar increase  . Triamterene Swelling and Other (See Comments)    Face swells, no breathing impairment  . Hydralazine Hcl Other (See Comments)    Fatigue; poor appetite  . Losartan Potassium-Hctz Swelling and Other (See Comments)  . Amoxicillin Rash and Other (See Comments)    Did it involve swelling of the face/tongue/throat, SOB, or low BP? No Did it involve sudden or severe rash/hives, skin peeling, or any reaction on the inside of your mouth or nose? No Did you need to seek medical attention at a hospital or doctor's office? No When did it last  happen?10 + years If all above answers are "NO", may proceed with cephalosporin use.    . Ampicillin Rash and Other (See Comments)    Did it involve swelling of the face/tongue/throat, SOB, or low BP? No Did it involve sudden or severe rash/hives, skin peeling, or any reaction on the inside of your mouth or nose? No Did you need to seek medical attention at a hospital or doctor's office? No When did it last happen?10 + years If all above answers are "NO", may proceed with cephalosporin use.   . Carvedilol Other (See Comments)    Low heart rate     Social History   Socioeconomic History  . Marital status: Married    Spouse name: Not on file  . Number of children: 2  . Years of education: Not on file  . Highest education level: Not on file  Occupational History  . Occupation: retired  Tobacco Use  . Smoking status: Former Smoker    Packs/day: 1.00    Years: 13.00    Pack years: 13.00    Quit date: 11/30/1970    Years since quitting: 49.2  . Smokeless tobacco: Never Used  Substance and Sexual Activity  . Alcohol use: No    Alcohol/week: 0.0 standard drinks  . Drug use: No  . Sexual activity: Yes  Other Topics Concern  . Not on file  Social History Narrative  . Not on file   Social Determinants of Health   Financial Resource Strain:   . Difficulty of Paying Living Expenses:   Food Insecurity:   . Worried About Charity fundraiser in the Last Year:   . Arboriculturist in the Last Year:   Transportation Needs:   . Film/video editor (Medical):   [name] Lack of Transportation (Non-Medical):   Physical Activity: Sufficiently Active  . Days of Exercise per Week: 4 days  . Minutes of Exercise per Session: 40 min  Stress:   . Feeling of Stress :   Social Connections:   . Frequency  of Communication with Friends and Family:   . Frequency of Social Gatherings with Friends and Family:   . Attends Religious Services:   . Active Member of Clubs or Organizations:    . Attends Archivist Meetings:   [name] Marital Status:   Intimate Partner Violence:   . Fear of Current or Ex-Partner:   . Emotionally Abused:   [name] Physically Abused:   . Sexually Abused:      ROS- All systems are reviewed and negative except as per the HPI above.  Physical Exam: Vitals:   02/21/20 0949  BP: (!) 118/50  Pulse: 75  Weight: 96.8 kg  Height: '6\' 2"'$  (1.88 m)    GEN- The patient is well appearing elderly male, alert and oriented x 3 today.   HEENT-head normocephalic, atraumatic, sclera clear, conjunctiva pink, hearing intact, trachea midline. Lungs- Clear to ausculation bilaterally, normal work of breathing Heart- irregular rate and rhythm, no murmurs, rubs or gallops  GI- soft, NT, ND, + BS Extremities- no clubbing, cyanosis. Trace bilateral edema MS- no significant deformity or atrophy Skin- no rash or lesion Psych- euthymic mood, full affect Neuro- strength and sensation are intact   Wt Readings from Last 3 Encounters:  02/21/20 96.8 kg  02/14/20 96.6 kg  02/08/20 97.3 kg    EKG today demonstrates afib HR 75, RBBB, LAFB, QRS 156, QTc 469  Echo 09/29/17 demonstrated  Left ventricle: The cavity size was normal. There was moderate  focal basal hypertrophy of the septum with otherwise moderate  concentric hypertrophy. Systolic function was normal. The  estimated ejection fraction was in the range of 55% to 60%. Wall  motion was normal; there were no regional wall motion  abnormalities. The study is not technically sufficient to allow  evaluation of LV diastolic function.  - Aortic valve: Trileaflet; severely thickened, severely calcified  leaflets. The non-coronary cusp is most affected. Noncoronary  cusp mobility was severely restricted. Valve area (VTI): 2.39  cm^2. Valve area (Vmax): 2.36 cm^2. Valve area (Vmean): 1.85  cm^2.  - Mitral valve: Moderately calcified annulus. Transvalvular  velocity was within the normal  range. There was no evidence for  stenosis. There was mild regurgitation.  - Left atrium: The atrium was moderately dilated.  - Right ventricle: The cavity size was normal. Wall thickness was  normal. Systolic function was normal.  - Atrial septum: No defect or patent foramen ovale was identified  by color flow Doppler.  - Tricuspid valve: There was trivial regurgitation.  - Pulmonary arteries: Systolic pressure was mildly increased. PA  peak pressure: 41 mm Hg (S).   Epic records are reviewed at length today  CHA2DS2-VASc Score = 7 The patient's score is based upon: CHF History: No HTN History: Yes Age : 52 + Diabetes History: Yes Stroke History: Yes Vascular Disease History: Yes Gender: Male   ASSESSMENT AND PLAN: 1. Persistent Atrial Fibrillation (ICD10:  I48.0) The patient's CHA2DS2-VASc score is 7, indicating a 11.2% annual risk of stroke. S/p DCCV on 02/14/20 which was unsuccessful.  Plan to continue amiodarone and repeat DCCV now that he has loaded for one month. CBC and Bmet today. Continue amiodarone 200 mg BID for now. Decrease to once daily after DCCV. Continue Eliquis 2.5 mg BID. Patient denies any missed doses in the last 3 weeks.  Continue Lopressor 12.5 mg BID  2. Secondary Hypercoagulable State (ICD10:  D68.69) The patient is at significant risk for stroke/thromboembolism based upon his CHA2DS2-VASc Score of 7.  Continue Apixaban (  Eliquis).   3. PVC S/p ablation 2018.  4. CAD S/p CABG, DES to SVG in 2018. He is on ASA and statin.  5. HTN Has had low readings at home. Mildly orthostatic in office today.  Stop amlodipine.    Follow up with Dr Burt Knack as scheduled.    [facility] [address] [place], [place] [address] [phone] 02/21/2020 10:28 AM

## 2020-02-21 NOTE — Patient Instructions (Addendum)
Stop amlodipine  Continue Amiodarone 200mg  twice a day until day of cardioversion then reduce to 200mg  once a day  Cardioversion scheduled for Monday, March 29th  - Arrive at the [facility] and go to admitting at [facility] not eat or drink anything after midnight the night prior to your procedure.  - Take all your morning medication with a sip of water prior to arrival.  - You will not be able to drive home after your procedure.   Follow up with Dr. Burt Knack as scheduled.

## 2020-02-22 ENCOUNTER — Other Ambulatory Visit: Payer: Self-pay

## 2020-02-22 ENCOUNTER — Other Ambulatory Visit (HOSPITAL_COMMUNITY)
Admission: RE | Admit: 2020-02-22 | Discharge: 2020-02-22 | Disposition: A | Discharge status: Home / Self Care | Payer: Medicare Other | Source: Ambulatory Visit | Attending: Internal Medicine | Admitting: Internal Medicine

## 2020-02-22 ENCOUNTER — Ambulatory Visit (INDEPENDENT_AMBULATORY_CARE_PROVIDER_SITE_OTHER): Payer: Medicare Other | Admitting: *Deleted

## 2020-02-22 DIAGNOSIS — E538 Deficiency of other specified B group vitamins: Secondary | ICD-10-CM | POA: Diagnosis not present

## 2020-02-22 DIAGNOSIS — Z01812 Encounter for preprocedural laboratory examination: Secondary | ICD-10-CM | POA: Insufficient documentation

## 2020-02-22 DIAGNOSIS — Z20822 Contact with and (suspected) exposure to covid-19: Secondary | ICD-10-CM | POA: Diagnosis not present

## 2020-02-22 LAB — SARS CORONAVIRUS 2 (TAT 6-24 HRS): SARS Coronavirus 2: NEGATIVE

## 2020-02-22 MED ORDER — CYANOCOBALAMIN 1000 MCG/ML IJ SOLN
1000.0000 ug | Freq: Once | INTRAMUSCULAR | Status: AC
  Administered 2020-02-22: 1000 ug via INTRAMUSCULAR

## 2020-02-22 NOTE — Telephone Encounter (Signed)
Confirmed appointment 4/15 with patient. He was grateful for call.

## 2020-02-22 NOTE — Progress Notes (Signed)
Pls cosign for B12 inj../lmb  

## 2020-02-26 ENCOUNTER — Encounter (HOSPITAL_COMMUNITY): Payer: Self-pay | Admitting: Internal Medicine

## 2020-02-26 ENCOUNTER — Ambulatory Visit (HOSPITAL_COMMUNITY): Payer: Medicare Other | Admitting: Anesthesiology

## 2020-02-26 ENCOUNTER — Other Ambulatory Visit: Payer: Self-pay

## 2020-02-26 ENCOUNTER — Encounter (HOSPITAL_COMMUNITY)
Admission: RE | Disposition: A | Discharge status: Home / Self Care | Payer: Self-pay | Source: Home / Self Care | Attending: Internal Medicine

## 2020-02-26 ENCOUNTER — Ambulatory Visit (HOSPITAL_COMMUNITY)
Admission: RE | Admit: 2020-02-26 | Discharge: 2020-02-26 | Disposition: A | Discharge status: Home / Self Care | Payer: Medicare Other | Attending: Internal Medicine | Admitting: Internal Medicine

## 2020-02-26 DIAGNOSIS — I493 Ventricular premature depolarization: Secondary | ICD-10-CM | POA: Insufficient documentation

## 2020-02-26 DIAGNOSIS — I4819 Other persistent atrial fibrillation: Secondary | ICD-10-CM | POA: Diagnosis not present

## 2020-02-26 DIAGNOSIS — I2510 Atherosclerotic heart disease of native coronary artery without angina pectoris: Secondary | ICD-10-CM | POA: Diagnosis not present

## 2020-02-26 DIAGNOSIS — Z79899 Other long term (current) drug therapy: Secondary | ICD-10-CM | POA: Insufficient documentation

## 2020-02-26 DIAGNOSIS — E119 Type 2 diabetes mellitus without complications: Secondary | ICD-10-CM | POA: Diagnosis not present

## 2020-02-26 DIAGNOSIS — Z7901 Long term (current) use of anticoagulants: Secondary | ICD-10-CM | POA: Diagnosis not present

## 2020-02-26 DIAGNOSIS — I714 Abdominal aortic aneurysm, without rupture: Secondary | ICD-10-CM | POA: Diagnosis not present

## 2020-02-26 DIAGNOSIS — E1122 Type 2 diabetes mellitus with diabetic chronic kidney disease: Secondary | ICD-10-CM | POA: Diagnosis not present

## 2020-02-26 DIAGNOSIS — Z7982 Long term (current) use of aspirin: Secondary | ICD-10-CM | POA: Diagnosis not present

## 2020-02-26 DIAGNOSIS — Z7984 Long term (current) use of oral hypoglycemic drugs: Secondary | ICD-10-CM | POA: Insufficient documentation

## 2020-02-26 DIAGNOSIS — N183 Chronic kidney disease, stage 3 unspecified: Secondary | ICD-10-CM | POA: Insufficient documentation

## 2020-02-26 DIAGNOSIS — Z87891 Personal history of nicotine dependence: Secondary | ICD-10-CM | POA: Insufficient documentation

## 2020-02-26 DIAGNOSIS — K219 Gastro-esophageal reflux disease without esophagitis: Secondary | ICD-10-CM | POA: Insufficient documentation

## 2020-02-26 DIAGNOSIS — D6869 Other thrombophilia: Secondary | ICD-10-CM | POA: Diagnosis not present

## 2020-02-26 DIAGNOSIS — R001 Bradycardia, unspecified: Secondary | ICD-10-CM | POA: Insufficient documentation

## 2020-02-26 DIAGNOSIS — I129 Hypertensive chronic kidney disease with stage 1 through stage 4 chronic kidney disease, or unspecified chronic kidney disease: Secondary | ICD-10-CM | POA: Insufficient documentation

## 2020-02-26 DIAGNOSIS — Z951 Presence of aortocoronary bypass graft: Secondary | ICD-10-CM | POA: Diagnosis not present

## 2020-02-26 HISTORY — PX: CARDIOVERSION: SHX1299

## 2020-02-26 LAB — GLUCOSE, CAPILLARY: Glucose-Capillary: 109 mg/dL — ABNORMAL HIGH (ref 70–99)

## 2020-02-26 SURGERY — CARDIOVERSION
Anesthesia: General

## 2020-02-26 MED ORDER — PROPOFOL 10 MG/ML IV BOLUS
INTRAVENOUS | Status: DC | PRN
  Administered 2020-02-26: 70 mg via INTRAVENOUS

## 2020-02-26 MED ORDER — SODIUM CHLORIDE 0.9 % IV SOLN
INTRAVENOUS | Status: AC | PRN
  Administered 2020-02-26: 500 mL via INTRAVENOUS

## 2020-02-26 MED ORDER — LIDOCAINE 2% (20 MG/ML) 5 ML SYRINGE
INTRAMUSCULAR | Status: DC | PRN
  Administered 2020-02-26: 100 mg via INTRAVENOUS

## 2020-02-26 NOTE — Transfer of Care (Signed)
Immediate Anesthesia Transfer of Care Note  Patient: Thomas Marshall  Procedure(s) Performed: CARDIOVERSION (N/A )  Patient Location: Endoscopy Unit  Anesthesia Type:General  Level of Consciousness: drowsy and patient cooperative  Airway & Oxygen Therapy: Patient Spontanous Breathing  Post-op Assessment: Report given to RN, Post -op Vital signs reviewed and stable and Patient moving all extremities X 4  Post vital signs: Reviewed and stable  Last Vitals:  Vitals Value Taken Time  BP    Temp    Pulse    Resp    SpO2      Last Pain:  Vitals:   02/26/20 0904  TempSrc: Oral  PainSc: 0-No pain         Complications: No apparent anesthesia complications

## 2020-02-26 NOTE — H&P (Signed)
.    INTERVAL PROCEDURE H&P  History and Physical Interval Note:  02/26/2020 9:17 AM  Thomas Marshall has presented today for their planned procedure. The various methods of treatment have been discussed with the patient and family. After consideration of risks, benefits and other options for treatment, the patient has consented to the procedure.  The patients' outpatient history has been reviewed, patient examined, and no change in status from most recent office note within the past 30 days. I have reviewed the patients' chart and labs and will proceed as planned. Questions were answered to the patient's satisfaction.   [name], MD, [facility], [place] Director of the Advanced Lipid Disorders &  Cardiovascular Risk Reduction Clinic Diplomate of the American Board of Clinical Lipidology Attending Cardiologist  Direct Dial: [phone]  Fax: [phone]  Website:  [email or web address] 02/26/2020, 9:17 AM

## 2020-02-26 NOTE — Anesthesia Preprocedure Evaluation (Signed)
Anesthesia Evaluation  Patient identified by MRN, date of birth, ID band Patient awake    Reviewed: Allergy & Precautions, NPO status , Patient's Chart, lab work & pertinent test results  History of Anesthesia Complications Negative for: history of anesthetic complications  Airway Mallampati: II  TM Distance: >3 FB Neck ROM: Full    Dental  (+) Edentulous Lower, Edentulous Upper   Pulmonary former smoker,    Pulmonary exam normal        Cardiovascular hypertension, Pt. on medications and Pt. on home beta blockers + CAD, + Cardiac Stents, + CABG and + Peripheral Vascular Disease  + dysrhythmias Atrial Fibrillation  Rhythm:Irregular Rate:Normal   '21 Carotid US - 60-79% left ICAS, patent right carotid s/p CEA  '18 TTE - Moderatefocal basal hypertrophy of the septum with otherwise moderate concentric hypertrophy. EF 55% to 60%. Trileaflet AV; severely thickened, severely calcified leaflets without AS. The non-coronary cusp is most affected. Noncoronary cusp mobility was severely restricted. Valve area (VTI): 2.39 cm^2. Mild MR. LA was moderately dilated. Trivial TR. PASP was mildly increased.     Neuro/Psych TIAnegative psych ROS   GI/Hepatic Neg liver ROS, GERD  Medicated and Controlled,  Endo/Other  diabetes, Type 2, Oral Hypoglycemic Agents K 5.3   Renal/GU CRFRenal disease     Musculoskeletal  (+) Arthritis ,   Abdominal   Peds  Hematology  (+) anemia ,   Anesthesia Other Findings Covid neg 02/10/20   Reproductive/Obstetrics                             Anesthesia Physical  Anesthesia Plan  ASA: III  Anesthesia Plan: General   Post-op Pain Management:    Induction: Intravenous  PONV Risk Score and Plan: 2 and Treatment may vary due to age or medical condition and Propofol infusion  Airway Management Planned: Mask and Natural Airway  Additional Equipment: None  Intra-op  Plan:   Post-operative Plan:   Informed Consent: I have reviewed the patients History and Physical, chart, labs and discussed the procedure including the risks, benefits and alternatives for the proposed anesthesia with the patient or authorized representative who has indicated his/her understanding and acceptance.       Plan Discussed with: CRNA  Anesthesia Plan Comments:         Anesthesia Quick Evaluation

## 2020-02-26 NOTE — Anesthesia Postprocedure Evaluation (Signed)
Anesthesia Post Note  Patient: Thomas Marshall  Procedure(s) Performed: CARDIOVERSION (N/A )     Patient location during evaluation: PACU Anesthesia Type: General Level of consciousness: sedated and patient cooperative Pain management: pain level controlled Vital Signs Assessment: post-procedure vital signs reviewed and stable Respiratory status: spontaneous breathing Cardiovascular status: stable Anesthetic complications: no    Last Vitals:  Vitals:   02/26/20 1005 02/26/20 1010  BP: (!) 140/56 (!) 152/56  Pulse: (!) 43 (!) 42  Resp: 17 15  Temp:    SpO2: 100% 100%    Last Pain:  Vitals:   02/26/20 1005  TempSrc:   PainSc: 0-No pain                 Nolon Nations

## 2020-02-26 NOTE — CV Procedure (Signed)
   CARDIOVERSION NOTE  Procedure: Electrical Cardioversion Indications:  Atrial Fibrillation  Procedure Details:  Consent: Risks of procedure as well as the alternatives and risks of each were explained to the (patient/caregiver).  Consent for procedure obtained.  Time Out: Verified patient identification, verified procedure, site/side was marked, verified correct patient position, special equipment/implants available, medications/allergies/relevent history reviewed, required imaging and test results available.  Performed  Patient placed on cardiac monitor, pulse oximetry, supplemental oxygen as necessary.  Sedation given: propofol per anesthesia Pacer pads placed anterior and posterior chest.  Cardioverted 1 time(s).  Cardioverted at 200J biphasic.  Impression: Findings: Post procedure EKG shows: NSR Complications: None Patient did tolerate procedure well.  Plan: 1. Successful DCCV to NSR with PAC's after a single 200J biphasic shock. 2. Follow-up in the Afib clinic.  Time Spent Directly with the Patient:  30 minutes   [name], MD, [facility], [place] Director of the Advanced Lipid Disorders &  Cardiovascular Risk Reduction Clinic Diplomate of the American Board of Clinical Lipidology Attending Cardiologist  Direct Dial: [phone]  Fax: [phone]  Website:  [email or web address] 02/26/2020, 9:38 AM

## 2020-02-29 ENCOUNTER — Telehealth (HOSPITAL_COMMUNITY): Payer: Self-pay | Admitting: *Deleted

## 2020-02-29 MED ORDER — AMLODIPINE BESYLATE 10 MG PO TABS: 5.0000 mg | ORAL_TABLET | Freq: Every day | ORAL | 3 refills | Status: DC

## 2020-02-29 NOTE — Telephone Encounter (Signed)
Pt called in with issue of elevated BP. While in AF over the last 2 months had issues with hypotension therefore several agents had been discontinued. Now back in rhythm his BP has been running 150-176/80-90s.  Pt self restarted his clonidine 0.1mg  twice a day but BP still running 152s/90s. Per Roderic Palau NP will restart amlodipine at 5mg  once a day - pt will call with update next week.

## 2020-03-04 ENCOUNTER — Ambulatory Visit (INDEPENDENT_AMBULATORY_CARE_PROVIDER_SITE_OTHER): Payer: Medicare Other | Admitting: Internal Medicine

## 2020-03-04 ENCOUNTER — Telehealth: Payer: Self-pay | Admitting: Cardiovascular Disease

## 2020-03-04 ENCOUNTER — Encounter: Payer: Self-pay | Admitting: Internal Medicine

## 2020-03-04 ENCOUNTER — Other Ambulatory Visit: Payer: Self-pay

## 2020-03-04 VITALS — BP 126/52 | HR 39 | Ht 74.0 in | Wt 215.8 lb

## 2020-03-04 DIAGNOSIS — I6523 Occlusion and stenosis of bilateral carotid arteries: Secondary | ICD-10-CM | POA: Diagnosis not present

## 2020-03-04 DIAGNOSIS — R001 Bradycardia, unspecified: Secondary | ICD-10-CM

## 2020-03-04 MED ORDER — AMIODARONE HCL 200 MG PO TABS: 200.0000 mg | ORAL_TABLET | Freq: Every day | ORAL | 3 refills | Status: DC

## 2020-03-04 MED ORDER — HYDRALAZINE HCL 10 MG PO TABS: 5.0000 mg | ORAL_TABLET | Freq: Three times a day (TID) | ORAL | 1 refills | Status: DC

## 2020-03-04 NOTE — Telephone Encounter (Signed)
Discussed with Dr. Harrington Challenger, DOD. She wants patient to come in for EKG and BP. No nurse visits available, will have patient see Dr. Harrington Challenger at 2:40 pm today.

## 2020-03-04 NOTE — Telephone Encounter (Signed)
New Message    STAT if HR is under 50 or over 120 (normal HR is 60-100 beats per minute)  1) What is your heart rate? Yesterday HR 38 Today HR 47   2) Do you have a log of your heart rate readings (document readings)? Yes   3) Do you have any other symptoms? Yesterday he felt dizziness, Pt says his heart rate has been low and sometimes his BP is high Yesterday 157/61 144/67 122/74

## 2020-03-04 NOTE — Telephone Encounter (Signed)
Patient complaining of low HR. Patient stated since his cardioversion on 02/25/30 he has been having elevated BP and low HR. Yesterday his HR got as low as 32. Patient stated he does have dizziness, SOB, and just does not feel good when his heart rate gets this low. This morning before patient took his medications his BP 157/72  HR 48. Was going to have patient hold his metoprolol, but he has already taken all his morning medications. Informed patient that a message would be sent to Dr. Burt Knack for advisement.

## 2020-03-04 NOTE — Progress Notes (Signed)
Cardiology Office Note   Date:  03/04/2020   ID:  Thomas Marshall, Thomas Marshall 26-Jun-1941, MRN [id number]  PCP:  [name], MD  Cardiologist:   Dorris Carnes, MD   Pt presents for bradycardia.   History of Present Illness: Thomas Marshall is a 79 y.o. male with a history of CAD(s/p CABG),PVC (s/p ablation in 2018), PAF, CVdz (s/p CEA), DM, AAA, HTN, CKD, HL   Presents back to clinic for bradycardia and dizziness  The pt has a history of afib  Followed ion afib clinic   Last seen on 02/07/20  Had cardioversoin on 02/14/20 which was unsuccessful  He was on amiodarone and set up for cardioversion   Cardioversion was done on 3/29  Since the cardioversion, the pt  has noted his HR has been slow   He has also complained of dizziness   He called in today.       Currently he is not dizzy  Breathing is OK           Current Meds  Medication Sig  . amiodarone (PACERONE) 200 MG tablet Take one tablet by mouth twice daily for one month then decrease to one tablet daily (Patient taking differently: Take 200 mg by mouth See admin instructions. Take 200 mg by mouth twice daily for one month then decrease to 200 mg daily)  . amLODipine (NORVASC) 10 MG tablet Take 0.5 tablets (5 mg total) by mouth daily.  [name] apixaban (ELIQUIS) 2.5 MG TABS tablet Take 1 tablet (2.5 mg total) by mouth 2 (two) times daily. (0800 & 2000)  . aspirin EC 81 MG tablet Take 1 tablet (81 mg total) by mouth daily.  [name] atorvastatin (LIPITOR) 40 MG tablet Take 40 mg by mouth at bedtime.   . Calcium-Magnesium-Zinc (CAL-MAG-ZINC PO) Take 1 tablet by mouth daily.  . Cholecalciferol (VITAMIN D-3) 25 MCG (1000 UT) CAPS Take 1,000 Units by mouth at bedtime.  . cloNIDine (CATAPRES) 0.1 MG tablet Take 1 tablet (0.1 mg total) by mouth 2 (two) times daily.  . Cyanocobalamin 1000 MCG/ML KIT Inject 1,000 mcg as directed every 30 (thirty) days.  [name] glucose blood test strip Accu-Check Guide Strips. Dx E11.9 Use to test blood glucose level 2 times a day.  .  magnesium oxide (MAG-OX) 400 MG tablet Take 1 tablet (400 mg total) by mouth 4 (four) times daily. (Patient taking differently: Take 800 mg by mouth 2 (two) times daily. )  . nitroGLYCERIN (NITROSTAT) 0.4 MG SL tablet Place 1 tablet (0.4 mg total) under the tongue every 5 (five) minutes as needed for chest pain (x 3 doses).  . terazosin (HYTRIN) 1 MG capsule Take 1 mg by mouth at bedtime.   . vitamin C (ASCORBIC ACID) 500 MG tablet Take 500 mg by mouth daily with breakfast.   . Zinc 22.5 MG TABS Take 22.5 mg by mouth every other day.     Allergies:   Lisinopril, Losartan, Nifedipine, Triamterene, Hydralazine hcl, Losartan potassium-hctz, Amoxicillin, Ampicillin, and Carvedilol   Past Medical History:  Diagnosis Date  . AAA (abdominal aortic aneurysm) ([place])   . Arthritis   . Cancer (HCC)    skin & squamous cell  . Carotid artery disease ([place])    a. s/p R CEA 12/2015.  [name] CKD (chronic kidney disease), stage III    a. per historical labs.  . Coronary artery disease    a. s/p CABG 2004.  . Diabetes mellitus without complication ([place])   . GERD (  gastroesophageal reflux disease)   . Hyperlipidemia   . Hypertension   . Medication intolerance Multiple   . PAF (paroxysmal atrial fibrillation) ([place])   . Pneumonia   . Sinus bradycardia    a. baseline HR 50s-60s, also h/o bradycardia on metoprolol and carvedilol    Past Surgical History:  Procedure Laterality Date  . ANKLE SURGERY     right fused  . BACK SURGERY     low back   . CARDIOVERSION N/A 02/14/2020   Procedure: CARDIOVERSION;  Surgeon: Geralynn Rile, MD;  Location: [facility];  Service: Cardiovascular;  Laterality: N/A;  . CARDIOVERSION N/A 02/26/2020   Procedure: CARDIOVERSION;  Surgeon: [name], MD;  Location: [facility] ENDOSCOPY;  Service: Cardiovascular;  Laterality: N/A;  . CHOLECYSTECTOMY N/A 12/07/2013   Procedure: LAPAROSCOPIC CHOLECYSTECTOMY ;  Surgeon: [name], MD;  Location: [facility];  Service: General;   Laterality: N/A;  . COLONOSCOPY    . CORONARY ARTERY BYPASS GRAFT  2004   [place]  . CORONARY STENT INTERVENTION N/A 05/21/2017   Procedure: Coronary Stent Intervention;  Surgeon: [name], MD;  Location: [place] CV LAB;  Service: Cardiovascular;  Laterality: N/A;  . ENDARTERECTOMY Right 12/10/2015   Procedure: Right Carotid ENDARTERECTOMY;  Surgeon: Conrad Lehigh, MD;  Location: [facility];  Service: Vascular;  Laterality: Right;  . EXPLORATION POST OPERATIVE OPEN HEART    . FINGER SURGERY     skin graft on rt index  . FINGER SURGERY Right    right index finger.  [name] HERNIA REPAIR Right    [facility]  . JOINT REPLACEMENT    . KNEE SURGERY     replacement on both knees  . LEFT HEART CATH AND CORS/GRAFTS ANGIOGRAPHY N/A 05/21/2017   Procedure: Left Heart Cath and Cors/Grafts Angiography;  Surgeon: [name], MD;  Location: [place] CV LAB;  Service: Cardiovascular;  Laterality: N/A;  . PROSTATE SURGERY    . PVC ABLATION N/A 10/27/2017   Procedure: PVC ABLATION;  Surgeon: Evans Lance, MD;  Location: [place] CV LAB;  Service: Cardiovascular;  Laterality: N/A;  . TONSILLECTOMY    . URINARY SURGERY     scar tissue     Social History:  The patient  reports that he quit smoking about 49 years ago. He has a 13.00 pack-year smoking history. He has never used smokeless tobacco. He reports that he does not drink alcohol or use drugs.   Family History:  The patient's family history includes Cancer in his sister; Diabetes in his brother and sister; Heart disease in his brother, father, and mother; Hyperlipidemia in his brother and father; Hypertension in his brother, father, and sister; Stroke in his mother.    ROS:  Please see the history of present illness. All other systems are reviewed and  Negative to the above problem except as noted.    PHYSICAL EXAM: VS:  BP (!) 126/52   Pulse (!) 39   Ht '6\' 2"'$  (1.88 m)   Wt 215 lb 12.8 oz (97.9 kg)   BMI 27.71 kg/m   GEN: Well  nourished, well developed, in no acute distress   Examined in chair   HEENT: normal  Neck: no JVD Cardiac: RRR; no murmurs, rubs, or gallops,no edema  Respiratory:  clear to auscultation bilaterally, normal work of breathing GI: soft, nontender, nondistended, + BS  No hepatomegaly  MS: no deformity Moving all extremities   Skin: warm and dry, no rash Neuro:  Strength and sensation  are intact Psych: euthymic mood, full affect   EKG:  EKG is ordered today.Sinus bradycardia  39 bpm  First degree AV block PR 254 msec     RBBB.     Lipid Panel    Component Value Date/Time   CHOL 114 11/21/2019 0610   TRIG 69 11/21/2019 0610   HDL 38 (L) 11/21/2019 0610   CHOLHDL 3.0 11/21/2019 0610   VLDL 14 11/21/2019 0610   LDLCALC 62 11/21/2019 0610   LDLDIRECT 67.0 09/16/2016 1455      Wt Readings from Last 3 Encounters:  03/04/20 215 lb 12.8 oz (97.9 kg)  02/26/20 210 lb (95.3 kg)  02/21/20 213 lb 6.4 oz (96.8 kg)      ASSESSMENT AND PLAN: 1  Bradycardia  The pt is s/p cardioversoin   Now is very bradycardia  Symptomatic    I have reviewed with Olin Pia.   Recomm stop metoprolol   Take 0.5 clonidine tomorrow then stop  Cut amiodarone to 1x per day    Pt will follow up Thursday for EKG   If still bradcardicwill consider for possible pacer implant  2  Atrial fibrillation  Remains in SR/SB   Cut back amiodarone to daily   Keep on Eliquis  3  CAD  Pt is s/p CABG  And then DES to SVG in 2018     4.  HTN  BP has been high at home   I have recomm hydralzine 5 mg tid   (SE was swelling)  Current medicines are reviewed at length with the patient today.  The patient does not have concerns regarding medicines.  Signed, Dorris Carnes, MD  03/04/2020 3:14 PM    [address], [place], [place]  [address] Phone: [phone]; Fax: [phone]

## 2020-03-04 NOTE — Patient Instructions (Addendum)
Medication Instructions:  Your physician has recommended you make the following change in your medication:  1.) stop metoprolol 2.) tomorrow - take 1/2 of clonidine, then stop 3.) amiodarone - decrease to one tablet daily 4.) start hydralazine 10 mg tablets (take 5 mg three times a day if blood pressure is greater than 160/  *If you need a refill on your cardiac medications before your next appointment, please call your pharmacy*   Lab Work: None  Testing/Procedures: None today   Follow-Up: EKG this Thursday (nurse visit)  Other Instructions

## 2020-03-06 ENCOUNTER — Ambulatory Visit: Payer: Medicare Other

## 2020-03-06 ENCOUNTER — Encounter (HOSPITAL_COMMUNITY): Payer: Medicare Other

## 2020-03-06 ENCOUNTER — Telehealth: Payer: Self-pay | Admitting: Internal Medicine

## 2020-03-06 NOTE — Telephone Encounter (Signed)
I spoke to the patient who said that his BP is better, but his HR is now 124 bpm, thinking he may be back in A Fib, Cardioverted last week 3/29. He denies CP, but is SOB and tired.  He is coming in for EKG/BP NV on 4/8.  I told him that if symptoms worsen, he needs to go to the ED.  He verbalized understanding.

## 2020-03-06 NOTE — Telephone Encounter (Signed)
I spoke to the patient because his BP has been greater than 799 systolic.  I reminded him that Dr Harrington Challenger instructed him on 4/5 to take Hydralazine 5 mg tid if BP is greater than 872 systolic.  He will do so and keep [place] updated.  He is asymptomatic.

## 2020-03-06 NOTE — Telephone Encounter (Signed)
Follow up  Pt is calling back, he said after speaking with the nurse earlier his BP is at 150/69 however his HR is at 124 and he feels like he'll be on Afib  please call

## 2020-03-06 NOTE — Telephone Encounter (Signed)
New message     Pt c/o BP issue:  1. What are your last 5 BP readings?  03-05-20 177/79  HR72, this am 178/88  HR 64 7am, 177/86 HR 85 at 10am today 2. Are you having any other symptoms (ex. Dizziness, headache, blurred vision, passed out)?  no 3. What is your medication issue?  Pt was recently seen and his medications were changed.  Please advise

## 2020-03-07 ENCOUNTER — Other Ambulatory Visit: Payer: Self-pay

## 2020-03-07 ENCOUNTER — Ambulatory Visit (HOSPITAL_COMMUNITY)
Admission: RE | Admit: 2020-03-07 | Discharge: 2020-03-07 | Disposition: A | Discharge status: Home / Self Care | Payer: Medicare Other | Source: Ambulatory Visit | Attending: Physician Assistant | Admitting: Physician Assistant

## 2020-03-07 ENCOUNTER — Ambulatory Visit (INDEPENDENT_AMBULATORY_CARE_PROVIDER_SITE_OTHER): Payer: Medicare Other

## 2020-03-07 ENCOUNTER — Encounter (HOSPITAL_COMMUNITY): Payer: Self-pay | Admitting: Physician Assistant

## 2020-03-07 VITALS — BP 142/68 | HR 102 | Ht 74.0 in | Wt 211.6 lb

## 2020-03-07 VITALS — BP 162/70 | HR 103

## 2020-03-07 DIAGNOSIS — I4819 Other persistent atrial fibrillation: Secondary | ICD-10-CM | POA: Diagnosis not present

## 2020-03-07 DIAGNOSIS — Z888 Allergy status to other drugs, medicaments and biological substances status: Secondary | ICD-10-CM | POA: Diagnosis not present

## 2020-03-07 DIAGNOSIS — Z8249 Family history of ischemic heart disease and other diseases of the circulatory system: Secondary | ICD-10-CM | POA: Diagnosis not present

## 2020-03-07 DIAGNOSIS — I714 Abdominal aortic aneurysm, without rupture: Secondary | ICD-10-CM | POA: Diagnosis not present

## 2020-03-07 DIAGNOSIS — Z85828 Personal history of other malignant neoplasm of skin: Secondary | ICD-10-CM | POA: Diagnosis not present

## 2020-03-07 DIAGNOSIS — Z87891 Personal history of nicotine dependence: Secondary | ICD-10-CM | POA: Insufficient documentation

## 2020-03-07 DIAGNOSIS — Z951 Presence of aortocoronary bypass graft: Secondary | ICD-10-CM | POA: Insufficient documentation

## 2020-03-07 DIAGNOSIS — N184 Chronic kidney disease, stage 4 (severe): Secondary | ICD-10-CM | POA: Diagnosis not present

## 2020-03-07 DIAGNOSIS — Z79899 Other long term (current) drug therapy: Secondary | ICD-10-CM | POA: Insufficient documentation

## 2020-03-07 DIAGNOSIS — Z7901 Long term (current) use of anticoagulants: Secondary | ICD-10-CM | POA: Insufficient documentation

## 2020-03-07 DIAGNOSIS — I129 Hypertensive chronic kidney disease with stage 1 through stage 4 chronic kidney disease, or unspecified chronic kidney disease: Secondary | ICD-10-CM | POA: Insufficient documentation

## 2020-03-07 DIAGNOSIS — I4891 Unspecified atrial fibrillation: Secondary | ICD-10-CM

## 2020-03-07 DIAGNOSIS — D6869 Other thrombophilia: Secondary | ICD-10-CM | POA: Insufficient documentation

## 2020-03-07 DIAGNOSIS — E1122 Type 2 diabetes mellitus with diabetic chronic kidney disease: Secondary | ICD-10-CM | POA: Diagnosis not present

## 2020-03-07 DIAGNOSIS — E785 Hyperlipidemia, unspecified: Secondary | ICD-10-CM | POA: Insufficient documentation

## 2020-03-07 DIAGNOSIS — I493 Ventricular premature depolarization: Secondary | ICD-10-CM | POA: Diagnosis not present

## 2020-03-07 DIAGNOSIS — Z833 Family history of diabetes mellitus: Secondary | ICD-10-CM | POA: Insufficient documentation

## 2020-03-07 DIAGNOSIS — I2510 Atherosclerotic heart disease of native coronary artery without angina pectoris: Secondary | ICD-10-CM | POA: Insufficient documentation

## 2020-03-07 DIAGNOSIS — Z880 Allergy status to penicillin: Secondary | ICD-10-CM | POA: Diagnosis not present

## 2020-03-07 DIAGNOSIS — Z7982 Long term (current) use of aspirin: Secondary | ICD-10-CM | POA: Insufficient documentation

## 2020-03-07 MED ORDER — METOPROLOL TARTRATE 25 MG PO TABS: 12.5000 mg | ORAL_TABLET | Freq: Two times a day (BID) | ORAL | 3 refills | Status: DC

## 2020-03-07 MED ORDER — AMIODARONE HCL 200 MG PO TABS: 200.0000 mg | ORAL_TABLET | Freq: Two times a day (BID) | ORAL | 3 refills | Status: DC

## 2020-03-07 NOTE — Progress Notes (Signed)
1.) Reason for visit: EKG  2.) Name of MD requesting visit: Dr. Harrington Challenger after medications for SB on 03/04/20  3.) H&P: History of A. FIB, last cardioversion 02/26/20 that was successful. Patient  Complaining of low HR and elevated BP on 03/04/20. Saw DOD, Dr. Harrington Challenger, on 03/04/20, medication changes were made at that time as written below.  1.) stop metoprolol 2.) tomorrow - take 1/2 of clonidine, then stop 3.) amiodarone - decrease to one tablet daily 4.) start hydralazine 10 mg tablets (take 5 mg three times a day if blood pressure is greater than 160/  4.) ROS related to problem: Patient BP today was 162/70 HR 103. EKG shows A. FIB. Patient complaining of dizziness, lightheadedness, and just does not feel good.  5.) Assessment and plan per MD: Dr. Tamala Julian, DOD was consulted and reviewed EKG. Medication changes were made. 1-Start back on metoprolol 12.5 mg BID ( patient was on before) 2- Increase amiodarone 200 mg BID (patient was on before). 3- See A. FIB clinic by Monday. A.FIB clinic had opening today, put patient on after reviewing with A. FIB clinic. Patient has appointment with Dr. Burt Knack next week for follow up as well.

## 2020-03-07 NOTE — Patient Instructions (Signed)
Medication Instructions:  Your physician has recommended you make the following change in your medication:  1-START back on Metoprolol 12.5 mg by mouth twice daily. 2-INCREASE Amiodarone 200 mg by mouth twice daily.  *If you need a refill on your cardiac medications before your next appointment, please call your pharmacy*   Lab Work: If you have labs (blood work) drawn today and your tests are completely normal, you will receive your results only by: [name] MyChart Message (if you have MyChart) OR . A paper copy in the mail If you have any lab test that is abnormal or we need to change your treatment, we will call you to review the results.  Follow-Up: At [facility], you and your health needs are our priority.  As part of our continuing mission to provide you with exceptional heart care, we have created designated Provider Care Teams.  These Care Teams include your primary Cardiologist (physician) and Advanced Practice Providers (APPs -  Physician Assistants and Nurse Practitioners) who all work together to provide you with the care you need, when you need it.  We recommend signing up for the patient portal called "MyChart".  Sign up information is provided on this After Visit Summary.  MyChart is used to connect with patients for Virtual Visits (Telemedicine).  Patients are able to view lab/test results, encounter notes, upcoming appointments, etc.  Non-urgent messages can be sent to your provider as well.   To learn more about what you can do with MyChart, go to [email or web address].    Your next appointment:   See A. FIB clinic today at 3:00 pm, and keep follow up appointment with Dr. Burt Knack next week.

## 2020-03-07 NOTE — Progress Notes (Signed)
Primary Care Physician: [name], MD Primary Cardiologist: Dr Burt Knack Primary Electrophysiologist: Dr Lovena Le Referring Physician: [name] PA   Thomas Marshall is a 79 y.o. male with a history of CAD status post CABG, symptomatic PVCs status post ablation 2018, paroxymal atrial fibrillation on dofetilide, carotid disease status post right carotid endarterectomy, DM, small AAA, hypertension, CKD stage III-IV and HLD who presents for follow up in the [facility]. Patient is on Eliquis for a CHADS2VASC score of 7. Patient seen in follow up by [name] PA for several weeks of increasing dyspnea. Patient was found to be in atrial fibrillation and DCCV was scheduled. Patient presented in SR and the procedure was cancelled. Since then, patient reports that he has had more SOB and fatigue. He checks his heart rate on his home BP machine which has shown rates in the 70s-120s. Patient reports that he was briefly on amiodarone in 2004 after his CABG and he tolerated the medication well. Patient is s/p DCCV on 02/14/20 which was unsuccessful.   On follow up today, he continued to load on amiodarone and underwent DCCV on 02/26/20 which was successful. Unfortunately, he became very bradycardic with pulse rates in the 30s. He was seen urgently by DOD and his rate control and amiodarone were decreased. He was back in afib on ECG today with symptoms of lightheadedness and overall feeling poorly.    Today, he denies symptoms of palpitations, chest pain, orthopnea, PND, lower extremity edema, presyncope, syncope, snoring, daytime somnolence, bleeding, or neurologic sequela. The patient is tolerating medications without difficulties and is otherwise without complaint today.    Atrial Fibrillation Risk Factors:  he does not have symptoms or diagnosis of sleep apnea. he does not have a history of rheumatic fever.   he has a BMI of Body mass index is 27.17 kg/m.[name] Filed Weights    03/07/20 1447  Weight: 96 kg    Family History  Problem Relation Age of Onset  . Stroke Mother   . Heart disease Mother   . Heart disease Father   . Hypertension Father   . Hyperlipidemia Father   . Cancer Sister        breast  and skin  . Diabetes Sister   . Hypertension Sister   . Diabetes Brother   . Heart disease Brother   . Hyperlipidemia Brother   . Hypertension Brother   . Early death Neg Hx      Atrial Fibrillation Management history:  Previous antiarrhythmic drugs: amiodarone, dofetilide Previous cardioversions: 02/14/20 Previous ablations: 2018 PVC CHADS2VASC score: 7 Anticoagulation history: Eliquis   Past Medical History:  Diagnosis Date  . AAA (abdominal aortic aneurysm) ([place])   . Arthritis   . Cancer (HCC)    skin & squamous cell  . Carotid artery disease ([place])    a. s/p R CEA 12/2015.  [name] CKD (chronic kidney disease), stage III    a. per historical labs.  . Coronary artery disease    a. s/p CABG 2004.  . Diabetes mellitus without complication ([place])   . GERD (gastroesophageal reflux disease)   . Hyperlipidemia   . Hypertension   . Medication intolerance Multiple   . PAF (paroxysmal atrial fibrillation) ([place])   . Pneumonia   . Sinus bradycardia    a. baseline HR 50s-60s, also h/o bradycardia on metoprolol and carvedilol   Past Surgical History:  Procedure Laterality Date  . ANKLE SURGERY     right fused  .  BACK SURGERY     low back   . CARDIOVERSION N/A 02/14/2020   Procedure: CARDIOVERSION;  Surgeon: Geralynn Rile, MD;  Location: [place];  Service: Cardiovascular;  Laterality: N/A;  . CARDIOVERSION N/A 02/26/2020   Procedure: CARDIOVERSION;  Surgeon: [name], MD;  Location: [facility] ENDOSCOPY;  Service: Cardiovascular;  Laterality: N/A;  . CHOLECYSTECTOMY N/A 12/07/2013   Procedure: LAPAROSCOPIC CHOLECYSTECTOMY ;  Surgeon: [name], MD;  Location: [place];  Service: General;  Laterality: N/A;  . COLONOSCOPY    .  CORONARY ARTERY BYPASS GRAFT  2004   [place]  . CORONARY STENT INTERVENTION N/A 05/21/2017   Procedure: Coronary Stent Intervention;  Surgeon: [name], MD;  Location: [place] CV LAB;  Service: Cardiovascular;  Laterality: N/A;  . ENDARTERECTOMY Right 12/10/2015   Procedure: Right Carotid ENDARTERECTOMY;  Surgeon: Conrad Nelson, MD;  Location: [place];  Service: Vascular;  Laterality: Right;  . EXPLORATION POST OPERATIVE OPEN HEART    . FINGER SURGERY     skin graft on rt index  . FINGER SURGERY Right    right index finger.  [name] HERNIA REPAIR Right    RIH  . JOINT REPLACEMENT    . KNEE SURGERY     replacement on both knees  . LEFT HEART CATH AND CORS/GRAFTS ANGIOGRAPHY N/A 05/21/2017   Procedure: Left Heart Cath and Cors/Grafts Angiography;  Surgeon: [name], MD;  Location: [place] CV LAB;  Service: Cardiovascular;  Laterality: N/A;  . PROSTATE SURGERY    . PVC ABLATION N/A 10/27/2017   Procedure: PVC ABLATION;  Surgeon: Evans Lance, MD;  Location: [place] CV LAB;  Service: Cardiovascular;  Laterality: N/A;  . TONSILLECTOMY    . URINARY SURGERY     scar tissue    Current Outpatient Medications  Medication Sig Dispense Refill  . amiodarone (PACERONE) 200 MG tablet Take 1 tablet (200 mg total) by mouth 2 (two) times daily. 180 tablet 3  . amLODipine (NORVASC) 10 MG tablet Take 0.5 tablets (5 mg total) by mouth daily. 180 tablet 3  . apixaban (ELIQUIS) 2.5 MG TABS tablet Take 1 tablet (2.5 mg total) by mouth 2 (two) times daily. (0800 & 2000) 180 tablet 3  . aspirin EC 81 MG tablet Take 1 tablet (81 mg total) by mouth daily. 90 tablet 3  . atorvastatin (LIPITOR) 40 MG tablet Take 40 mg by mouth at bedtime.     . Calcium-Magnesium-Zinc (CAL-MAG-ZINC PO) Take 1 tablet by mouth daily.    . Cholecalciferol (VITAMIN D-3) 25 MCG (1000 UT) CAPS Take 1,000 Units by mouth at bedtime.    . Cyanocobalamin 1000 MCG/ML KIT Inject 1,000 mcg as directed every 30 (thirty)  days.    [name] glucose blood test strip Accu-Check Guide Strips. Dx E11.9 Use to test blood glucose level 2 times a day. 50 each 11  . hydrALAZINE (APRESOLINE) 10 MG tablet Take 0.5 tablets (5 mg total) by mouth 3 (three) times daily. 45 tablet 1  . magnesium oxide (MAG-OX) 400 MG tablet Take 1 tablet (400 mg total) by mouth 4 (four) times daily. (Patient taking differently: Take 800 mg by mouth 2 (two) times daily. ) 360 tablet 3  . metoprolol tartrate (LOPRESSOR) 25 MG tablet Take 0.5 tablets (12.5 mg total) by mouth 2 (two) times daily. 90 tablet 3  . nitroGLYCERIN (NITROSTAT) 0.4 MG SL tablet Place 1 tablet (0.4 mg total) under the tongue every 5 (five) minutes as needed for chest  pain (x 3 doses). 30 tablet 3  . pantoprazole (PROTONIX) 40 MG tablet Take 1 tablet (40 mg total) by mouth daily. 90 tablet 3  . terazosin (HYTRIN) 1 MG capsule Take 1 mg by mouth at bedtime.  30 capsule 11  . vitamin C (ASCORBIC ACID) 500 MG tablet Take 500 mg by mouth daily with breakfast.     . Zinc 22.5 MG TABS Take 22.5 mg by mouth every other day.     No current facility-administered medications for this encounter.    Allergies  Allergen Reactions  . Lisinopril Swelling and Other (See Comments)    Angioedema.   . Losartan Swelling and Other (See Comments)    Lips swell Angioedema   . Nifedipine Swelling and Other (See Comments)    Sugar increase  . Triamterene Swelling and Other (See Comments)    Face swells, no breathing impairment  . Hydralazine Hcl Other (See Comments)    Fatigue; poor appetite  . Losartan Potassium-Hctz Swelling and Other (See Comments)  . Amoxicillin Rash and Other (See Comments)    Did it involve swelling of the face/tongue/throat, SOB, or low BP? No Did it involve sudden or severe rash/hives, skin peeling, or any reaction on the inside of your mouth or nose? No Did you need to seek medical attention at a hospital or doctor's office? No When did it last happen?10 +  years If all above answers are "NO", may proceed with cephalosporin use.    . Ampicillin Rash and Other (See Comments)    Did it involve swelling of the face/tongue/throat, SOB, or low BP? No Did it involve sudden or severe rash/hives, skin peeling, or any reaction on the inside of your mouth or nose? No Did you need to seek medical attention at a hospital or doctor's office? No When did it last happen?10 + years If all above answers are "NO", may proceed with cephalosporin use.   . Carvedilol Other (See Comments)    Low heart rate     Social History   Socioeconomic History  . Marital status: Married    Spouse name: Not on file  . Number of children: 2  . Years of education: Not on file  . Highest education level: Not on file  Occupational History  . Occupation: retired  Tobacco Use  . Smoking status: Former Smoker    Packs/day: 1.00    Years: 13.00    Pack years: 13.00    Quit date: 11/30/1970    Years since quitting: 49.3  . Smokeless tobacco: Never Used  Substance and Sexual Activity  . Alcohol use: No    Alcohol/week: 0.0 standard drinks  . Drug use: No  . Sexual activity: Yes  Other Topics Concern  . Not on file  Social History Narrative  . Not on file   Social Determinants of Health   Financial Resource Strain:   . Difficulty of Paying Living Expenses:   Food Insecurity:   . Worried About Charity fundraiser in the Last Year:   . Arboriculturist in the Last Year:   Transportation Needs:   . Film/video editor (Medical):   [name] Lack of Transportation (Non-Medical):   Physical Activity: Sufficiently Active  . Days of Exercise per Week: 4 days  . Minutes of Exercise per Session: 40 min  Stress:   . Feeling of Stress :   Social Connections:   . Frequency of Communication with Friends and Family:   . Frequency  of Social Gatherings with Friends and Family:   . Attends Religious Services:   . Active Member of Clubs or Organizations:   . Attends English as a second language teacher Meetings:   [name] Marital Status:   Intimate Partner Violence:   . Fear of Current or Ex-Partner:   . Emotionally Abused:   [name] Physically Abused:   . Sexually Abused:      ROS- All systems are reviewed and negative except as per the HPI above.  Physical Exam: Vitals:   03/07/20 1447  BP: (!) 142/68  Pulse: (!) 102  SpO2: 97%  Weight: 96 kg  Height: '6\' 2"'$  (1.88 m)    GEN- The patient is well appearing elderly male, alert and oriented x 3 today.   HEENT-head normocephalic, atraumatic, sclera clear, conjunctiva pink, hearing intact, trachea midline. Lungs- Clear to ausculation bilaterally, normal work of breathing Heart- irregular rate and rhythm, no murmurs, rubs or gallops  GI- soft, NT, ND, + BS Extremities- no clubbing, cyanosis, or edema MS- no significant deformity or atrophy Skin- no rash or lesion Psych- euthymic mood, full affect Neuro- strength and sensation are intact   Wt Readings from Last 3 Encounters:  03/07/20 96 kg  03/04/20 97.9 kg  02/26/20 95.3 kg    EKG is not ordered today. ECG from [facility] reviewed.   Echo 09/29/17 demonstrated  Left ventricle: The cavity size was normal. There was moderate  focal basal hypertrophy of the septum with otherwise moderate  concentric hypertrophy. Systolic function was normal. The  estimated ejection fraction was in the range of 55% to 60%. Wall  motion was normal; there were no regional wall motion  abnormalities. The study is not technically sufficient to allow  evaluation of LV diastolic function.  - Aortic valve: Trileaflet; severely thickened, severely calcified  leaflets. The non-coronary cusp is most affected. Noncoronary  cusp mobility was severely restricted. Valve area (VTI): 2.39  cm^2. Valve area (Vmax): 2.36 cm^2. Valve area (Vmean): 1.85  cm^2.  - Mitral valve: Moderately calcified annulus. Transvalvular  velocity was within the normal range. There was no evidence  for  stenosis. There was mild regurgitation.  - Left atrium: The atrium was moderately dilated.  - Right ventricle: The cavity size was normal. Wall thickness was  normal. Systolic function was normal.  - Atrial septum: No defect or patent foramen ovale was identified  by color flow Doppler.  - Tricuspid valve: There was trivial regurgitation.  - Pulmonary arteries: Systolic pressure was mildly increased. PA  peak pressure: 41 mm Hg (S).   Epic records are reviewed at length today  CHA2DS2-VASc Score = 7 The patient's score is based upon: CHF History: No HTN History: Yes Age : 52 + Diabetes History: Yes Stroke History: Yes Vascular Disease History: Yes Gender: Male   ASSESSMENT AND PLAN: 1. Persistent Atrial Fibrillation (ICD10:  I48.0) The patient's CHA2DS2-VASc score is 7, indicating a 11.2% annual risk of stroke. Patient back in rapid afib today. Suspect tachybradycardia syndrome. We discussed therapeutic options today, also d/w Dr Lovena Le. Will plan for PPM implantation. Continue amiodarone 200 mg BID for now for rate control.  Continue Eliquis 2.5 mg BID.  Continue Lopressor 12.5 mg BID  2. Secondary Hypercoagulable State (ICD10:  D68.69) The patient is at significant risk for stroke/thromboembolism based upon his CHA2DS2-VASc Score of 7.  Continue Apixaban (Eliquis).   3. PVC S/p ablation 2018.  4. CAD S/p CABG, DES to SVG in 2018. Followed by Dr Burt Knack.  5. HTN  Stable, no changes today.   Follow up with Dr Lovena Le 4/13 to consider PPM implantation.    [facility] [address] [place], [place] [address] [phone] 03/07/2020 3:45 PM

## 2020-03-12 ENCOUNTER — Ambulatory Visit (INDEPENDENT_AMBULATORY_CARE_PROVIDER_SITE_OTHER): Payer: Medicare Other | Admitting: Internal Medicine

## 2020-03-12 ENCOUNTER — Encounter: Payer: Self-pay | Admitting: Internal Medicine

## 2020-03-12 ENCOUNTER — Other Ambulatory Visit: Payer: Self-pay

## 2020-03-12 ENCOUNTER — Other Ambulatory Visit (HOSPITAL_COMMUNITY)
Admission: RE | Admit: 2020-03-12 | Discharge: 2020-03-12 | Disposition: A | Discharge status: Home / Self Care | Payer: Medicare Other | Source: Ambulatory Visit | Attending: Internal Medicine | Admitting: Internal Medicine

## 2020-03-12 VITALS — BP 130/74 | HR 64 | Ht 74.0 in | Wt 215.4 lb

## 2020-03-12 DIAGNOSIS — I493 Ventricular premature depolarization: Secondary | ICD-10-CM | POA: Diagnosis not present

## 2020-03-12 DIAGNOSIS — Z20822 Contact with and (suspected) exposure to covid-19: Secondary | ICD-10-CM | POA: Diagnosis not present

## 2020-03-12 DIAGNOSIS — I6523 Occlusion and stenosis of bilateral carotid arteries: Secondary | ICD-10-CM | POA: Diagnosis not present

## 2020-03-12 DIAGNOSIS — I4819 Other persistent atrial fibrillation: Secondary | ICD-10-CM

## 2020-03-12 DIAGNOSIS — Z01812 Encounter for preprocedural laboratory examination: Secondary | ICD-10-CM | POA: Diagnosis not present

## 2020-03-12 LAB — SARS CORONAVIRUS 2 (TAT 6-24 HRS): SARS Coronavirus 2: NEGATIVE

## 2020-03-12 NOTE — H&P (View-Only) (Signed)
HPI Mr. Stapel returns today after developing recurrent atrial fib despite dofetilide. He was placed on amiodarone  After presenting with atrial fib and a RVR. He then developed NSR and has had sinus bradycardia. He had his amio held and then reverted back to atrial fib with a RVR. He presents today to discuss his options. His amio was restarted. His rates are better but he is still in atrial fib.   Allergies  Allergen Reactions  . Lisinopril Swelling and Other (See Comments)    Angioedema.   . Losartan Swelling and Other (See Comments)    Lips swell Angioedema   . Nifedipine Swelling and Other (See Comments)    Sugar increase  . Triamterene Swelling and Other (See Comments)    Face swells, no breathing impairment  . Hydralazine Hcl Other (See Comments)    Fatigue; poor appetite  . Losartan Potassium-Hctz Swelling and Other (See Comments)  . Amoxicillin Rash and Other (See Comments)    Did it involve swelling of the face/tongue/throat, SOB, or low BP? No Did it involve sudden or severe rash/hives, skin peeling, or any reaction on the inside of your mouth or nose? No Did you need to seek medical attention at a hospital or doctor's office? No When did it last happen?10 + years If all above answers are "NO", may proceed with cephalosporin use.    . Ampicillin Rash and Other (See Comments)    Did it involve swelling of the face/tongue/throat, SOB, or low BP? No Did it involve sudden or severe rash/hives, skin peeling, or any reaction on the inside of your mouth or nose? No Did you need to seek medical attention at a hospital or doctor's office? No When did it last happen?10 + years If all above answers are "NO", may proceed with cephalosporin use.   . Carvedilol Other (See Comments)    Low heart rate      Current Outpatient Medications  Medication Sig Dispense Refill  . amiodarone (PACERONE) 200 MG tablet Take 1 tablet (200 mg total) by mouth 2 (two) times  daily. 180 tablet 3  . amLODipine (NORVASC) 10 MG tablet Take 0.5 tablets (5 mg total) by mouth daily. 180 tablet 3  . apixaban (ELIQUIS) 2.5 MG TABS tablet Take 1 tablet (2.5 mg total) by mouth 2 (two) times daily. (0800 & 2000) 180 tablet 3  . aspirin EC 81 MG tablet Take 1 tablet (81 mg total) by mouth daily. 90 tablet 3  . atorvastatin (LIPITOR) 40 MG tablet Take 40 mg by mouth at bedtime.     . Calcium-Magnesium-Zinc (CAL-MAG-ZINC PO) Take 1 tablet by mouth daily.    . Cholecalciferol (VITAMIN D-3) 25 MCG (1000 UT) CAPS Take 1,000 Units by mouth at bedtime.    . Cyanocobalamin 1000 MCG/ML KIT Inject 1,000 mcg as directed every 30 (thirty) days.    [name] glucose blood test strip Accu-Check Guide Strips. Dx E11.9 Use to test blood glucose level 2 times a day. 50 each 11  . hydrALAZINE (APRESOLINE) 10 MG tablet Take 0.5 tablets (5 mg total) by mouth 3 (three) times daily. 45 tablet 1  . magnesium oxide (MAG-OX) 400 MG tablet Take 1 tablet (400 mg total) by mouth 4 (four) times daily. 360 tablet 3  . metoprolol tartrate (LOPRESSOR) 25 MG tablet Take 0.5 tablets (12.5 mg total) by mouth 2 (two) times daily. 90 tablet 3  . nitroGLYCERIN (NITROSTAT) 0.4 MG SL tablet Place 1 tablet (0.4 mg total)  under the tongue every 5 (five) minutes as needed for chest pain (x 3 doses). 30 tablet 3  . terazosin (HYTRIN) 1 MG capsule Take 1 mg by mouth at bedtime.  30 capsule 11  . vitamin C (ASCORBIC ACID) 500 MG tablet Take 500 mg by mouth daily with breakfast.     . Zinc 22.5 MG TABS Take 22.5 mg by mouth every other day.    . pantoprazole (PROTONIX) 40 MG tablet Take 1 tablet (40 mg total) by mouth daily. 90 tablet 3   No current facility-administered medications for this visit.     Past Medical History:  Diagnosis Date  . AAA (abdominal aortic aneurysm) ([place])   . Arthritis   . Cancer (HCC)    skin & squamous cell  . Carotid artery disease ([place])    a. s/p R CEA 12/2015.  [name] CKD (chronic kidney disease),  stage III    a. per historical labs.  . Coronary artery disease    a. s/p CABG 2004.  . Diabetes mellitus without complication ([place])   . GERD (gastroesophageal reflux disease)   . Hyperlipidemia   . Hypertension   . Medication intolerance Multiple   . PAF (paroxysmal atrial fibrillation) ([place])   . Pneumonia   . Sinus bradycardia    a. baseline HR 50s-60s, also h/o bradycardia on metoprolol and carvedilol    ROS:   All systems reviewed and negative except as noted in the HPI.   Past Surgical History:  Procedure Laterality Date  . ANKLE SURGERY     right fused  . BACK SURGERY     low back   . CARDIOVERSION N/A 02/14/2020   Procedure: CARDIOVERSION;  Surgeon: Geralynn Rile, MD;  Location: [place];  Service: Cardiovascular;  Laterality: N/A;  . CARDIOVERSION N/A 02/26/2020   Procedure: CARDIOVERSION;  Surgeon: [name], MD;  Location: [facility] ENDOSCOPY;  Service: Cardiovascular;  Laterality: N/A;  . CHOLECYSTECTOMY N/A 12/07/2013   Procedure: LAPAROSCOPIC CHOLECYSTECTOMY ;  Surgeon: [name], MD;  Location: [place];  Service: General;  Laterality: N/A;  . COLONOSCOPY    . CORONARY ARTERY BYPASS GRAFT  2004   [place]  . CORONARY STENT INTERVENTION N/A 05/21/2017   Procedure: Coronary Stent Intervention;  Surgeon: [name], MD;  Location: [place] CV LAB;  Service: Cardiovascular;  Laterality: N/A;  . ENDARTERECTOMY Right 12/10/2015   Procedure: Right Carotid ENDARTERECTOMY;  Surgeon: Conrad Bluebell, MD;  Location: [place];  Service: Vascular;  Laterality: Right;  . EXPLORATION POST OPERATIVE OPEN HEART    . FINGER SURGERY     skin graft on rt index  . FINGER SURGERY Right    right index finger.  [name] HERNIA REPAIR Right    [facility]  . JOINT REPLACEMENT    . KNEE SURGERY     replacement on both knees  . LEFT HEART CATH AND CORS/GRAFTS ANGIOGRAPHY N/A 05/21/2017   Procedure: Left Heart Cath and Cors/Grafts Angiography;  Surgeon: [name], MD;   Location: [place] CV LAB;  Service: Cardiovascular;  Laterality: N/A;  . PROSTATE SURGERY    . PVC ABLATION N/A 10/27/2017   Procedure: PVC ABLATION;  Surgeon: Evans Lance, MD;  Location: [place] CV LAB;  Service: Cardiovascular;  Laterality: N/A;  . TONSILLECTOMY    . URINARY SURGERY     scar tissue     Family History  Problem Relation Age of Onset  . Stroke Mother   . Heart disease Mother   .  Heart disease Father   . Hypertension Father   . Hyperlipidemia Father   . Cancer Sister        breast  and skin  . Diabetes Sister   . Hypertension Sister   . Diabetes Brother   . Heart disease Brother   . Hyperlipidemia Brother   . Hypertension Brother   . Early death Neg Hx      Social History   Socioeconomic History  . Marital status: Married    Spouse name: Not on file  . Number of children: 2  . Years of education: Not on file  . Highest education level: Not on file  Occupational History  . Occupation: retired  Tobacco Use  . Smoking status: Former Smoker    Packs/day: 1.00    Years: 13.00    Pack years: 13.00    Quit date: 11/30/1970    Years since quitting: 49.3  . Smokeless tobacco: Never Used  Substance and Sexual Activity  . Alcohol use: No    Alcohol/week: 0.0 standard drinks  . Drug use: No  . Sexual activity: Yes  Other Topics Concern  . Not on file  Social History Narrative  . Not on file   Social Determinants of Health   Financial Resource Strain:   . Difficulty of Paying Living Expenses:   Food Insecurity:   . Worried About Charity fundraiser in the Last Year:   . Arboriculturist in the Last Year:   Transportation Needs:   . Film/video editor (Medical):   [name] Lack of Transportation (Non-Medical):   Physical Activity: Sufficiently Active  . Days of Exercise per Week: 4 days  . Minutes of Exercise per Session: 40 min  Stress:   . Feeling of Stress :   Social Connections:   . Frequency of Communication with Friends and Family:    . Frequency of Social Gatherings with Friends and Family:   . Attends Religious Services:   . Active Member of Clubs or Organizations:   . Attends Archivist Meetings:   [name] Marital Status:   Intimate Partner Violence:   . Fear of Current or Ex-Partner:   . Emotionally Abused:   [name] Physically Abused:   . Sexually Abused:      BP 130/74   Pulse 64   Ht _0  (1.88 m)   Wt 215 lb 6.4 oz (97.7 kg)   SpO2 99%   BMI 27.66 kg/m   Physical Exam:  Well appearing NAD HEENT: Unremarkable Neck:  No JVD, no thyromegally Lymphatics:  No adenopathy Back:  No CVA tenderness Lungs:  Clear with no wheezes HEART:  IRegular rate rhythm, no murmurs, no rubs, no clicks Abd:  soft, positive bowel sounds, no organomegally, no rebound, no guarding Ext:  2 plus pulses, no edema, no cyanosis, no clubbing Skin:  No rashes no nodules Neuro:  CN II through XII intact, motor grossly intact  EKG - atrial fib with a controlled VR  Assess/Plan: 1. Persistent atrial fib with a RVR - we discussed the treatment options and we will continue amiodarone. Plan for DCCV if he does not convert to NSR 2. Sinus node dysfunction - in rhythm his rates are in the 30's. I have recommended proceeding with PPM. He will need amio and a beta blocker to keep him from going too fast in atrial fib. 3. PVC's - these appear to be controlled s/p ablation. 4. Coags - he will continue his systemic anti-coagulation  but will stop it a day or two before his PPM.  Mikle Bosworth.D.

## 2020-03-12 NOTE — Progress Notes (Signed)
HPI Thomas Marshall returns today after developing recurrent atrial fib despite dofetilide. He was placed on amiodarone  After presenting with atrial fib and a RVR. He then developed NSR and has had sinus bradycardia. He had his amio held and then reverted back to atrial fib with a RVR. He presents today to discuss his options. His amio was restarted. His rates are better but he is still in atrial fib.   Allergies  Allergen Reactions  . Lisinopril Swelling and Other (See Comments)    Angioedema.   . Losartan Swelling and Other (See Comments)    Lips swell Angioedema   . Nifedipine Swelling and Other (See Comments)    Sugar increase  . Triamterene Swelling and Other (See Comments)    Face swells, no breathing impairment  . Hydralazine Hcl Other (See Comments)    Fatigue; poor appetite  . Losartan Potassium-Hctz Swelling and Other (See Comments)  . Amoxicillin Rash and Other (See Comments)    Did it involve swelling of the face/tongue/throat, SOB, or low BP? No Did it involve sudden or severe rash/hives, skin peeling, or any reaction on the inside of your mouth or nose? No Did you need to seek medical attention at a hospital or doctor's office? No When did it last happen?10 + years If all above answers are "NO", may proceed with cephalosporin use.    . Ampicillin Rash and Other (See Comments)    Did it involve swelling of the face/tongue/throat, SOB, or low BP? No Did it involve sudden or severe rash/hives, skin peeling, or any reaction on the inside of your mouth or nose? No Did you need to seek medical attention at a hospital or doctor's office? No When did it last happen?10 + years If all above answers are "NO", may proceed with cephalosporin use.   . Carvedilol Other (See Comments)    Low heart rate      Current Outpatient Medications  Medication Sig Dispense Refill  . amiodarone (PACERONE) 200 MG tablet Take 1 tablet (200 mg total) by mouth 2 (two) times  daily. 180 tablet 3  . amLODipine (NORVASC) 10 MG tablet Take 0.5 tablets (5 mg total) by mouth daily. 180 tablet 3  . apixaban (ELIQUIS) 2.5 MG TABS tablet Take 1 tablet (2.5 mg total) by mouth 2 (two) times daily. (0800 & 2000) 180 tablet 3  . aspirin EC 81 MG tablet Take 1 tablet (81 mg total) by mouth daily. 90 tablet 3  . atorvastatin (LIPITOR) 40 MG tablet Take 40 mg by mouth at bedtime.     . Calcium-Magnesium-Zinc (CAL-MAG-ZINC PO) Take 1 tablet by mouth daily.    . Cholecalciferol (VITAMIN D-3) 25 MCG (1000 UT) CAPS Take 1,000 Units by mouth at bedtime.    . Cyanocobalamin 1000 MCG/ML KIT Inject 1,000 mcg as directed every 30 (thirty) days.    [name] glucose blood test strip Accu-Check Guide Strips. Dx E11.9 Use to test blood glucose level 2 times a day. 50 each 11  . hydrALAZINE (APRESOLINE) 10 MG tablet Take 0.5 tablets (5 mg total) by mouth 3 (three) times daily. 45 tablet 1  . magnesium oxide (MAG-OX) 400 MG tablet Take 1 tablet (400 mg total) by mouth 4 (four) times daily. 360 tablet 3  . metoprolol tartrate (LOPRESSOR) 25 MG tablet Take 0.5 tablets (12.5 mg total) by mouth 2 (two) times daily. 90 tablet 3  . nitroGLYCERIN (NITROSTAT) 0.4 MG SL tablet Place 1 tablet (0.4 mg total)  under the tongue every 5 (five) minutes as needed for chest pain (x 3 doses). 30 tablet 3  . terazosin (HYTRIN) 1 MG capsule Take 1 mg by mouth at bedtime.  30 capsule 11  . vitamin C (ASCORBIC ACID) 500 MG tablet Take 500 mg by mouth daily with breakfast.     . Zinc 22.5 MG TABS Take 22.5 mg by mouth every other day.    . pantoprazole (PROTONIX) 40 MG tablet Take 1 tablet (40 mg total) by mouth daily. 90 tablet 3   No current facility-administered medications for this visit.     Past Medical History:  Diagnosis Date  . AAA (abdominal aortic aneurysm) ([place])   . Arthritis   . Cancer (HCC)    skin & squamous cell  . Carotid artery disease ([place])    a. s/p R CEA 12/2015.  [name] CKD (chronic kidney disease),  stage III    a. per historical labs.  . Coronary artery disease    a. s/p CABG 2004.  . Diabetes mellitus without complication ([place])   . GERD (gastroesophageal reflux disease)   . Hyperlipidemia   . Hypertension   . Medication intolerance Multiple   . PAF (paroxysmal atrial fibrillation) ([place])   . Pneumonia   . Sinus bradycardia    a. baseline HR 50s-60s, also h/o bradycardia on metoprolol and carvedilol    ROS:   All systems reviewed and negative except as noted in the HPI.   Past Surgical History:  Procedure Laterality Date  . ANKLE SURGERY     right fused  . BACK SURGERY     low back   . CARDIOVERSION N/A 02/14/2020   Procedure: CARDIOVERSION;  Surgeon: Geralynn Rile, MD;  Location: [place];  Service: Cardiovascular;  Laterality: N/A;  . CARDIOVERSION N/A 02/26/2020   Procedure: CARDIOVERSION;  Surgeon: [name], MD;  Location: [facility] ENDOSCOPY;  Service: Cardiovascular;  Laterality: N/A;  . CHOLECYSTECTOMY N/A 12/07/2013   Procedure: LAPAROSCOPIC CHOLECYSTECTOMY ;  Surgeon: [name], MD;  Location: [place];  Service: General;  Laterality: N/A;  . COLONOSCOPY    . CORONARY ARTERY BYPASS GRAFT  2004   [place]  . CORONARY STENT INTERVENTION N/A 05/21/2017   Procedure: Coronary Stent Intervention;  Surgeon: [name], MD;  Location: [place] CV LAB;  Service: Cardiovascular;  Laterality: N/A;  . ENDARTERECTOMY Right 12/10/2015   Procedure: Right Carotid ENDARTERECTOMY;  Surgeon: Conrad Woodburn, MD;  Location: [place];  Service: Vascular;  Laterality: Right;  . EXPLORATION POST OPERATIVE OPEN HEART    . FINGER SURGERY     skin graft on rt index  . FINGER SURGERY Right    right index finger.  [name] HERNIA REPAIR Right    [facility]  . JOINT REPLACEMENT    . KNEE SURGERY     replacement on both knees  . LEFT HEART CATH AND CORS/GRAFTS ANGIOGRAPHY N/A 05/21/2017   Procedure: Left Heart Cath and Cors/Grafts Angiography;  Surgeon: [name], MD;   Location: [place] CV LAB;  Service: Cardiovascular;  Laterality: N/A;  . PROSTATE SURGERY    . PVC ABLATION N/A 10/27/2017   Procedure: PVC ABLATION;  Surgeon: Evans Lance, MD;  Location: [place] CV LAB;  Service: Cardiovascular;  Laterality: N/A;  . TONSILLECTOMY    . URINARY SURGERY     scar tissue     Family History  Problem Relation Age of Onset  . Stroke Mother   . Heart disease Mother   .  Heart disease Father   . Hypertension Father   . Hyperlipidemia Father   . Cancer Sister        breast  and skin  . Diabetes Sister   . Hypertension Sister   . Diabetes Brother   . Heart disease Brother   . Hyperlipidemia Brother   . Hypertension Brother   . Early death Neg Hx      Social History   Socioeconomic History  . Marital status: Married    Spouse name: Not on file  . Number of children: 2  . Years of education: Not on file  . Highest education level: Not on file  Occupational History  . Occupation: retired  Tobacco Use  . Smoking status: Former Smoker    Packs/day: 1.00    Years: 13.00    Pack years: 13.00    Quit date: 11/30/1970    Years since quitting: 49.3  . Smokeless tobacco: Never Used  Substance and Sexual Activity  . Alcohol use: No    Alcohol/week: 0.0 standard drinks  . Drug use: No  . Sexual activity: Yes  Other Topics Concern  . Not on file  Social History Narrative  . Not on file   Social Determinants of Health   Financial Resource Strain:   . Difficulty of Paying Living Expenses:   Food Insecurity:   . Worried About Charity fundraiser in the Last Year:   . Arboriculturist in the Last Year:   Transportation Needs:   . Film/video editor (Medical):   [name] Lack of Transportation (Non-Medical):   Physical Activity: Sufficiently Active  . Days of Exercise per Week: 4 days  . Minutes of Exercise per Session: 40 min  Stress:   . Feeling of Stress :   Social Connections:   . Frequency of Communication with Friends and Family:    . Frequency of Social Gatherings with Friends and Family:   . Attends Religious Services:   . Active Member of Clubs or Organizations:   . Attends Archivist Meetings:   [name] Marital Status:   Intimate Partner Violence:   . Fear of Current or Ex-Partner:   . Emotionally Abused:   [name] Physically Abused:   . Sexually Abused:      BP 130/74   Pulse 64   Ht _0  (1.88 m)   Wt 215 lb 6.4 oz (97.7 kg)   SpO2 99%   BMI 27.66 kg/m   Physical Exam:  Well appearing NAD HEENT: Unremarkable Neck:  No JVD, no thyromegally Lymphatics:  No adenopathy Back:  No CVA tenderness Lungs:  Clear with no wheezes HEART:  IRegular rate rhythm, no murmurs, no rubs, no clicks Abd:  soft, positive bowel sounds, no organomegally, no rebound, no guarding Ext:  2 plus pulses, no edema, no cyanosis, no clubbing Skin:  No rashes no nodules Neuro:  CN II through XII intact, motor grossly intact  EKG - atrial fib with a controlled VR  Assess/Plan: 1. Persistent atrial fib with a RVR - we discussed the treatment options and we will continue amiodarone. Plan for DCCV if he does not convert to NSR 2. Sinus node dysfunction - in rhythm his rates are in the 30's. I have recommended proceeding with PPM. He will need amio and a beta blocker to keep him from going too fast in atrial fib. 3. PVC's - these appear to be controlled s/p ablation. 4. Coags - he will continue his systemic anti-coagulation  but will stop it a day or two before his PPM.  Thomas Marshall.D.

## 2020-03-12 NOTE — Patient Instructions (Addendum)
Medication Instructions:  Your physician recommends that you continue on your current medications as directed. Please refer to the Current Medication list given to you today.  Labwork: None ordered.  Testing/Procedures: Your physician has recommended that you have a pacemaker inserted. A pacemaker is a small device that is placed under the skin of your chest or abdomen to help control abnormal heart rhythms. This device uses electrical pulses to prompt the heart to beat at a normal rate. Pacemakers are used to treat heart rhythms that are too slow. Wire (leads) are attached to the pacemaker that goes into the chambers of you heart. This is done in the hospital and usually requires and overnight stay. Please see the instruction sheet given to you today for more information.  Follow-Up:  SEE INSTRUCTION LETTER  Any Other Special Instructions Will Be Listed Below (If Applicable).  If you need a refill on your cardiac medications before your next appointment, please call your pharmacy.    Pacemaker Implantation, Adult Pacemaker implantation is a procedure to place a pacemaker inside your chest. A pacemaker is a small computer that sends electrical signals to the heart and helps your heart beat normally. A pacemaker also stores information about your heart rhythms. You may need pacemaker implantation if you:  Have a slow heartbeat (bradycardia).  Faint (syncope).  Have shortness of breath (dyspnea) due to heart problems. The pacemaker attaches to your heart through a wire, called a lead. Sometimes just one lead is needed. Other times, there will be two leads. There are two types of pacemakers:  Transvenous pacemaker. This type is placed under the skin or muscle of your chest. The lead goes through a vein in the chest area to reach the inside of the heart.  Epicardial pacemaker. This type is placed under the skin or muscle of your chest or belly. The lead goes through your chest to the  outside of the heart. Tell a health care provider about:  Any allergies you have.  All medicines you are taking, including vitamins, herbs, eye drops, creams, and over-the-counter medicines.  Any problems you or family members have had with anesthetic medicines.  Any blood or bone disorders you have.  Any surgeries you have had.  Any medical conditions you have.  Whether you are pregnant or may be pregnant. What are the risks? Generally, this is a safe procedure. However, problems may occur, including:  Infection.  Bleeding.  Failure of the pacemaker or the lead.  Collapse of a lung or bleeding into a lung.  Blood clot inside a blood vessel with a lead.  Damage to the heart.  Infection inside the heart (endocarditis).  Allergic reactions to medicines. What happens before the procedure? Staying hydrated Follow instructions from your health care provider about hydration, which may include:  Up to 2 hours before the procedure - you may continue to drink clear liquids, such as water, clear fruit juice, black coffee, and plain tea. Eating and drinking restrictions Follow instructions from your health care provider about eating and drinking, which may include:  8 hours before the procedure - stop eating heavy meals or foods such as meat, fried foods, or fatty foods.  6 hours before the procedure - stop eating light meals or foods, such as toast or cereal.  6 hours before the procedure - stop drinking milk or drinks that contain milk.  2 hours before the procedure - stop drinking clear liquids. Medicines  Ask your health care provider about: ? Changing or  stopping your regular medicines. This is especially important if you are taking diabetes medicines or blood thinners. ? Taking medicines such as aspirin and ibuprofen. These medicines can thin your blood. Do not take these medicines before your procedure if your health care provider instructs you not to.  You may be  given antibiotic medicine to help prevent infection. General instructions  You will have a heart evaluation. This may include an electrocardiogram (ECG), chest X-ray, and heart imaging (echocardiogram,  or echo) tests.  You will have blood tests.  Do not use any products that contain nicotine or tobacco, such as cigarettes and e-cigarettes. If you need help quitting, ask your health care provider.  Plan to have someone take you home from the hospital or clinic.  If you will be going home right after the procedure, plan to have someone with you for 24 hours.  Ask your health care provider how your surgical site will be marked or identified. What happens during the procedure?  To reduce your risk of infection: ? Your health care team will wash or sanitize their hands. ? Your skin will be washed with soap. ? Hair may be removed from the surgical area.  An IV tube will be inserted into one of your veins.  You will be given one or more of the following: ? A medicine to help you relax (sedative). ? A medicine to numb the area (local anesthetic). ? A medicine to make you fall asleep (general anesthetic).  If you are getting a transvenous pacemaker: ? An incision will be made in your upper chest. ? A pocket will be made for the pacemaker. It may be placed under the skin or between layers of muscle. ? The lead will be inserted into a blood vessel that returns to the heart. ? While X-rays are taken by an imaging machine (fluoroscopy), the lead will be advanced through the vein to the inside of your heart. ? The other end of the lead will be tunneled under the skin and attached to the pacemaker.  If you are getting an epicardial pacemaker: ? An incision will be made near your ribs or breastbone (sternum) for the lead. ? The lead will be attached to the outside of your heart. ? Another incision will be made in your chest or upper belly to create a pocket for the pacemaker. ? The free end  of the lead will be tunneled under the skin and attached to the pacemaker.  The transvenous or epicardial pacemaker will be tested. Imaging studies may be done to check the lead position.  The incisions will be closed with stitches (sutures), adhesive strips, or skin glue.  Bandages (dressing) will be placed over the incisions. The procedure may vary among health care providers and hospitals. What happens after the procedure?  Your blood pressure, heart rate, breathing rate, and blood oxygen level will be monitored until the medicines you were given have worn off.  You will be given antibiotics and pain medicine.  ECG and chest x-rays will be done.  You will wear a continuous type of ECG (Holter monitor) to check your heart rhythm.  Your health care provider will program the pacemaker.  Do not drive for 24 hours if you received a sedative. This information is not intended to replace advice given to you by your health care provider. Make sure you discuss any questions you have with your health care provider. Document Revised: 08/05/2018 Document Reviewed: 04/29/2016 Elsevier Patient Education  2020 Elsevier  Inc.

## 2020-03-14 ENCOUNTER — Ambulatory Visit: Payer: Medicare Other | Admitting: Orthotics

## 2020-03-14 ENCOUNTER — Ambulatory Visit (HOSPITAL_COMMUNITY): Payer: Medicare Other

## 2020-03-14 ENCOUNTER — Ambulatory Visit: Payer: Medicare Other | Admitting: Cardiovascular Disease

## 2020-03-14 ENCOUNTER — Ambulatory Visit (HOSPITAL_COMMUNITY)
Admission: RE | Admit: 2020-03-14 | Discharge: 2020-03-14 | Disposition: A | Discharge status: Home / Self Care | Payer: Medicare Other | Attending: Internal Medicine | Admitting: Internal Medicine

## 2020-03-14 ENCOUNTER — Ambulatory Visit (HOSPITAL_COMMUNITY)
Admission: RE | Disposition: A | Discharge status: Home / Self Care | Payer: Medicare Other | Source: Home / Self Care | Attending: Internal Medicine

## 2020-03-14 ENCOUNTER — Other Ambulatory Visit: Payer: Self-pay

## 2020-03-14 DIAGNOSIS — Z8349 Family history of other endocrine, nutritional and metabolic diseases: Secondary | ICD-10-CM | POA: Diagnosis not present

## 2020-03-14 DIAGNOSIS — E1122 Type 2 diabetes mellitus with diabetic chronic kidney disease: Secondary | ICD-10-CM | POA: Diagnosis not present

## 2020-03-14 DIAGNOSIS — Z7982 Long term (current) use of aspirin: Secondary | ICD-10-CM | POA: Diagnosis not present

## 2020-03-14 DIAGNOSIS — E785 Hyperlipidemia, unspecified: Secondary | ICD-10-CM | POA: Diagnosis not present

## 2020-03-14 DIAGNOSIS — Z951 Presence of aortocoronary bypass graft: Secondary | ICD-10-CM | POA: Diagnosis not present

## 2020-03-14 DIAGNOSIS — N183 Chronic kidney disease, stage 3 unspecified: Secondary | ICD-10-CM | POA: Diagnosis not present

## 2020-03-14 DIAGNOSIS — I129 Hypertensive chronic kidney disease with stage 1 through stage 4 chronic kidney disease, or unspecified chronic kidney disease: Secondary | ICD-10-CM | POA: Insufficient documentation

## 2020-03-14 DIAGNOSIS — I495 Sick sinus syndrome: Secondary | ICD-10-CM | POA: Diagnosis not present

## 2020-03-14 DIAGNOSIS — I714 Abdominal aortic aneurysm, without rupture: Secondary | ICD-10-CM | POA: Insufficient documentation

## 2020-03-14 DIAGNOSIS — Z7901 Long term (current) use of anticoagulants: Secondary | ICD-10-CM | POA: Insufficient documentation

## 2020-03-14 DIAGNOSIS — I6521 Occlusion and stenosis of right carotid artery: Secondary | ICD-10-CM | POA: Diagnosis not present

## 2020-03-14 DIAGNOSIS — Z880 Allergy status to penicillin: Secondary | ICD-10-CM | POA: Insufficient documentation

## 2020-03-14 DIAGNOSIS — Z87891 Personal history of nicotine dependence: Secondary | ICD-10-CM | POA: Diagnosis not present

## 2020-03-14 DIAGNOSIS — Z8249 Family history of ischemic heart disease and other diseases of the circulatory system: Secondary | ICD-10-CM | POA: Insufficient documentation

## 2020-03-14 DIAGNOSIS — I4819 Other persistent atrial fibrillation: Secondary | ICD-10-CM | POA: Diagnosis not present

## 2020-03-14 DIAGNOSIS — Z950 Presence of cardiac pacemaker: Secondary | ICD-10-CM | POA: Diagnosis not present

## 2020-03-14 DIAGNOSIS — Z79899 Other long term (current) drug therapy: Secondary | ICD-10-CM | POA: Diagnosis not present

## 2020-03-14 DIAGNOSIS — Z888 Allergy status to other drugs, medicaments and biological substances status: Secondary | ICD-10-CM | POA: Diagnosis not present

## 2020-03-14 DIAGNOSIS — K219 Gastro-esophageal reflux disease without esophagitis: Secondary | ICD-10-CM | POA: Diagnosis not present

## 2020-03-14 DIAGNOSIS — I2510 Atherosclerotic heart disease of native coronary artery without angina pectoris: Secondary | ICD-10-CM | POA: Insufficient documentation

## 2020-03-14 HISTORY — PX: PACEMAKER IMPLANT: EP1218

## 2020-03-14 LAB — GLUCOSE, CAPILLARY
Glucose-Capillary: 120 mg/dL — ABNORMAL HIGH (ref 70–99)
Glucose-Capillary: 121 mg/dL — ABNORMAL HIGH (ref 70–99)

## 2020-03-14 IMAGING — CR DG CHEST 2V
2 series · 2 of 2 positions shown · non-contrast
Comparison: [DATE] and earlier.

CLINICAL DATA: Post pacemaker placement.

EXAM:
CHEST - 2 VIEW

[w chest pa]
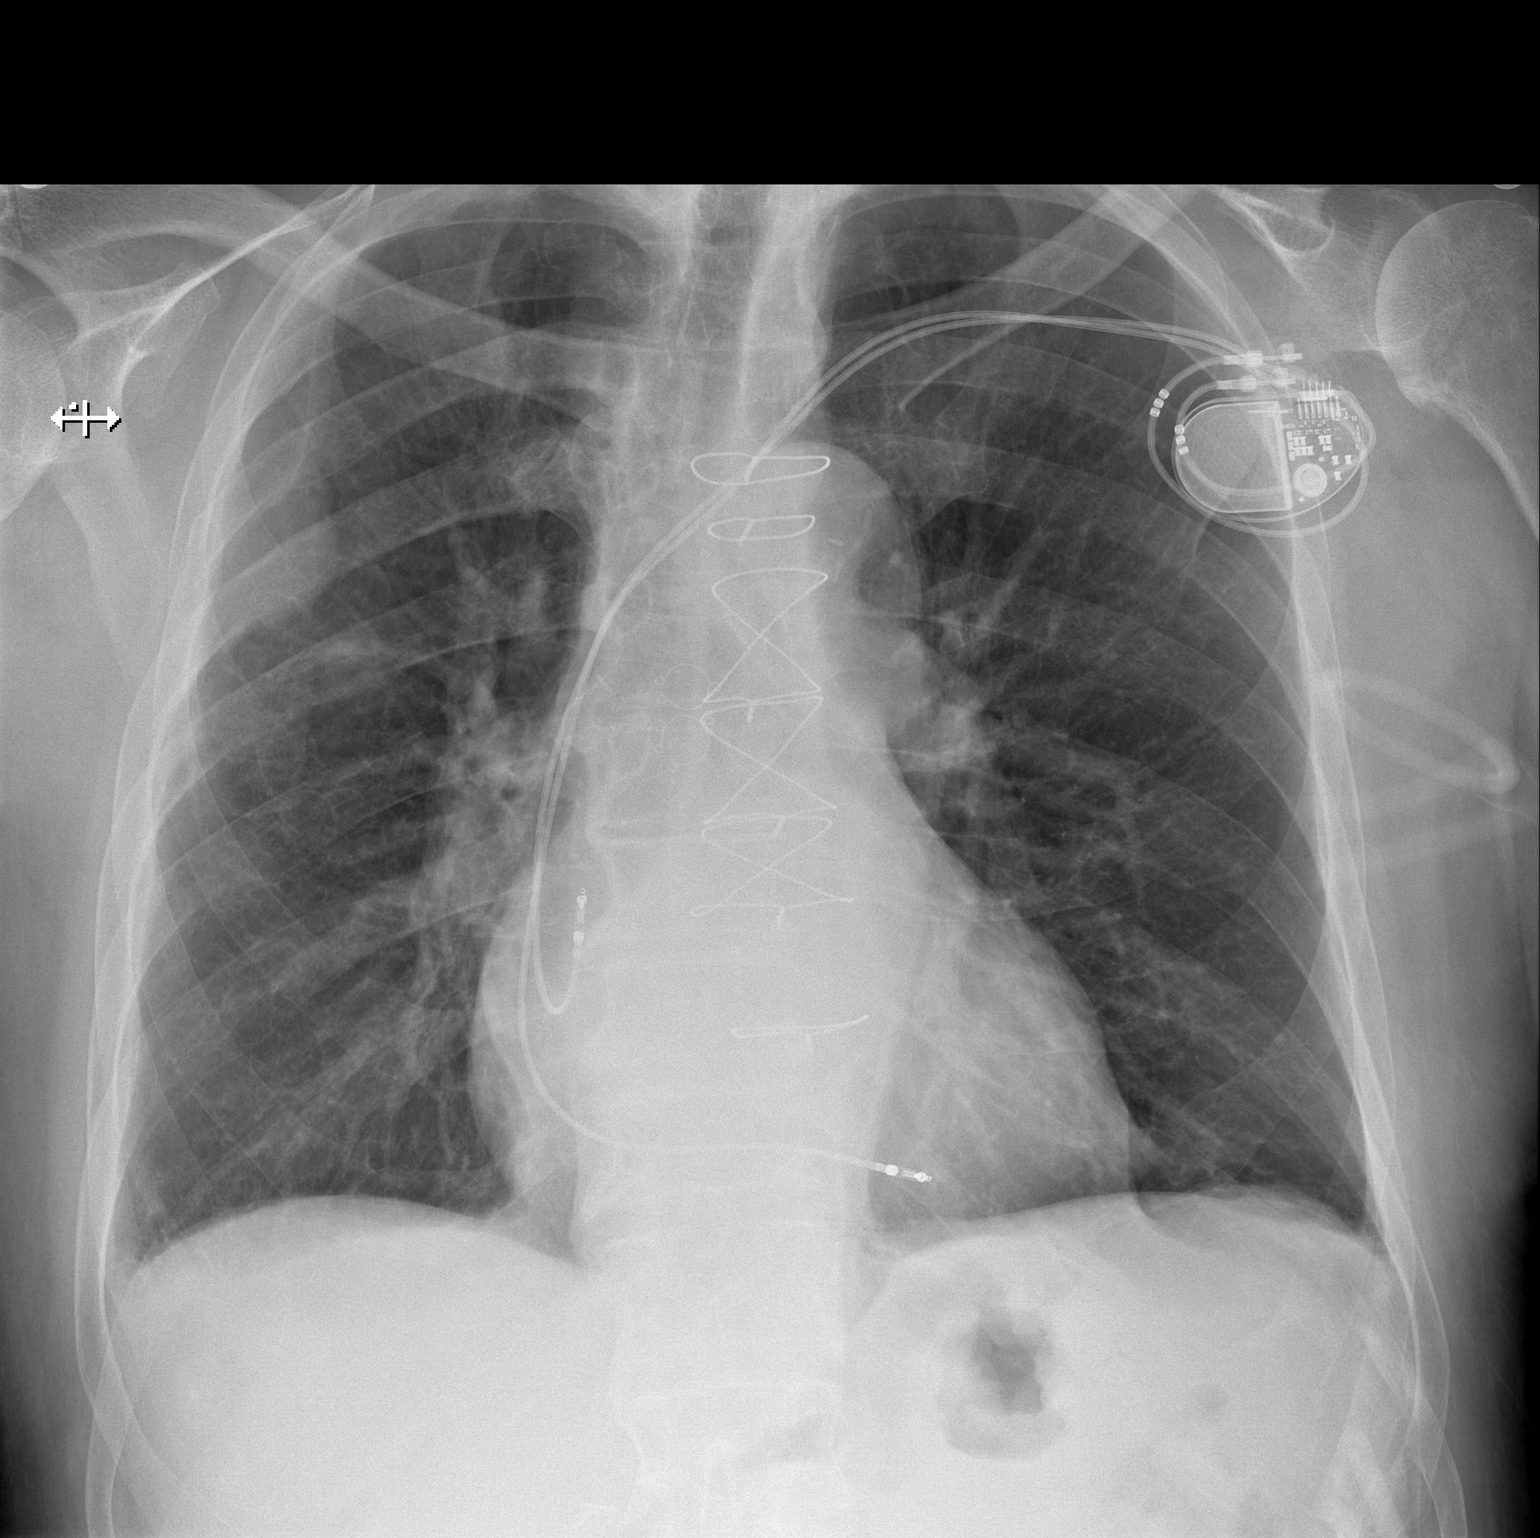

[w chest lat]
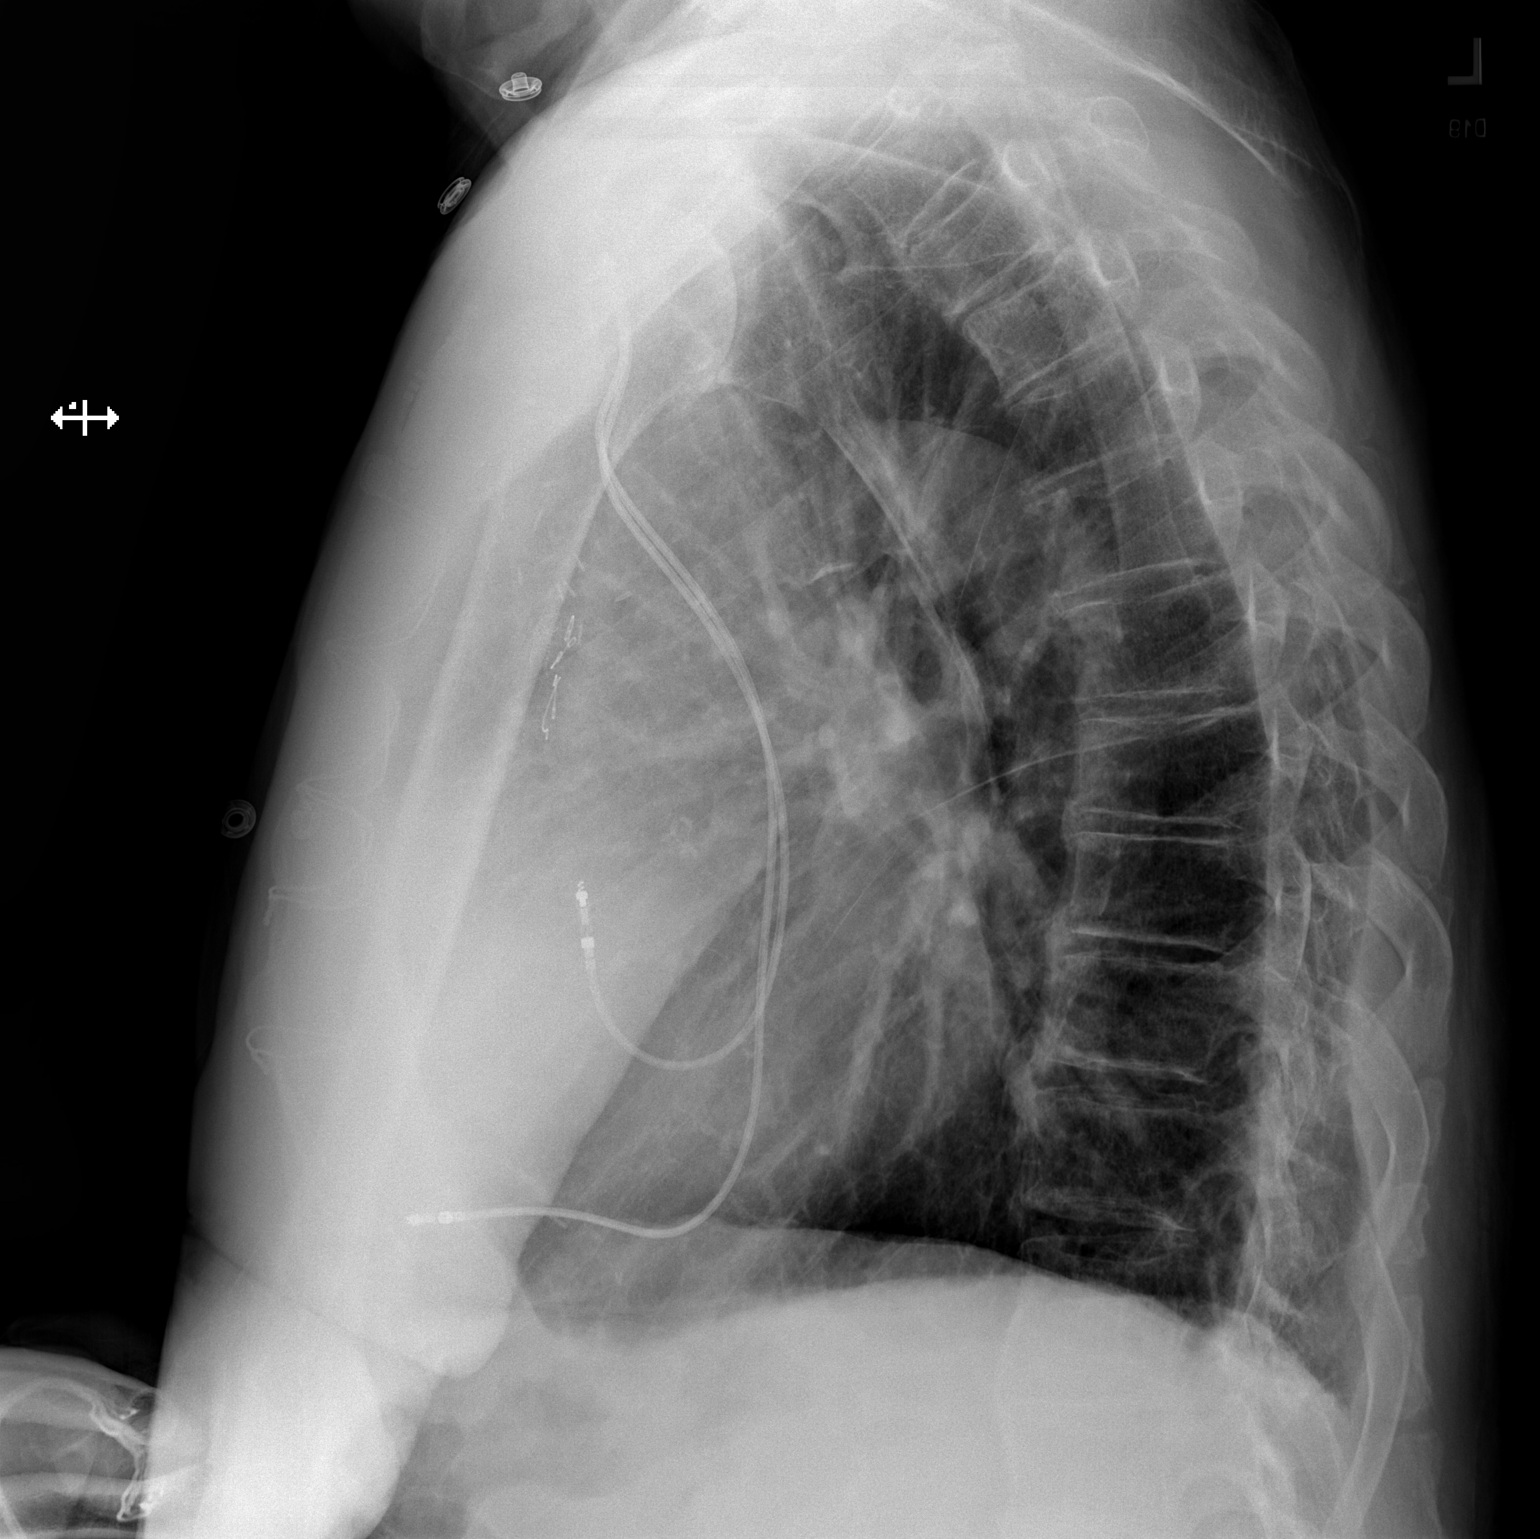

[2 of 2 positions shown; findings below may reference images not displayed]

FINDINGS: LEFT subclavian dual lead transvenous pacemaker with the lead tips
at the expected location of the RIGHT atrial appendage and RIGHT
ventricular apex. No evidence of pneumothorax or mediastinal
hematoma.

Prior sternotomy for CABG. Cardiac silhouette upper normal in size
to slightly enlarged. Thoracic aorta atherosclerotic and mildly
tortuous. Hilar and mediastinal contours otherwise unremarkable.
Lungs clear. Bronchovascular markings normal. Pulmonary vascularity
normal. No visible pleural effusions. No pneumothorax. Multiple
remote healed RIGHT rib fractures. Degenerative changes and DISH
involving the thoracic spine.
IMPRESSION: 1. LEFT subclavian dual lead transvenous pacemaker appropriately
positioned without acute complicating features.
2. Borderline heart size. No acute cardiopulmonary disease.

## 2020-03-14 SURGERY — PACEMAKER IMPLANT
Anesthesia: LOCAL

## 2020-03-14 MED ORDER — MIDAZOLAM HCL 5 MG/5ML IJ SOLN
INTRAMUSCULAR | Status: AC
  Filled 2020-03-14: qty 5

## 2020-03-14 MED ORDER — FENTANYL CITRATE (PF) 100 MCG/2ML IJ SOLN
INTRAMUSCULAR | Status: AC
  Filled 2020-03-14: qty 2

## 2020-03-14 MED ORDER — ACETAMINOPHEN 325 MG PO TABS
325.0000 mg | ORAL_TABLET | ORAL | Status: DC | PRN
  Filled 2020-03-14: qty 2

## 2020-03-14 MED ORDER — LIDOCAINE HCL (PF) 1 % IJ SOLN
INTRAMUSCULAR | Status: DC | PRN
  Administered 2020-03-14: 60 mL

## 2020-03-14 MED ORDER — VANCOMYCIN HCL IN DEXTROSE 1-5 GM/200ML-% IV SOLN
INTRAVENOUS | Status: AC
  Filled 2020-03-14: qty 200

## 2020-03-14 MED ORDER — SODIUM CHLORIDE 0.9 % IV SOLN
INTRAVENOUS | Status: AC
  Filled 2020-03-14: qty 2

## 2020-03-14 MED ORDER — VANCOMYCIN HCL IN DEXTROSE 1-5 GM/200ML-% IV SOLN
1000.0000 mg | INTRAVENOUS | Status: AC
  Administered 2020-03-14: 1000 mg via INTRAVENOUS
  Filled 2020-03-14: qty 200

## 2020-03-14 MED ORDER — SODIUM CHLORIDE 0.9 % IV SOLN
80.0000 mg | INTRAVENOUS | Status: AC
  Administered 2020-03-14: 80 mg
  Filled 2020-03-14: qty 2

## 2020-03-14 MED ORDER — CHLORHEXIDINE GLUCONATE 4 % EX LIQD
4.0000 "application " | Freq: Once | CUTANEOUS | Status: DC
  Filled 2020-03-14: qty 60

## 2020-03-14 MED ORDER — LIDOCAINE HCL 1 % IJ SOLN
INTRAMUSCULAR | Status: AC
  Filled 2020-03-14: qty 60

## 2020-03-14 MED ORDER — FENTANYL CITRATE (PF) 100 MCG/2ML IJ SOLN
INTRAMUSCULAR | Status: DC | PRN
  Administered 2020-03-14: 12.5 ug via INTRAVENOUS
  Administered 2020-03-14: 25 ug via INTRAVENOUS
  Administered 2020-03-14: 12.5 ug via INTRAVENOUS
  Administered 2020-03-14: 25 ug via INTRAVENOUS

## 2020-03-14 MED ORDER — HEPARIN (PORCINE) IN NACL 1000-0.9 UT/500ML-% IV SOLN
INTRAVENOUS | Status: AC
  Filled 2020-03-14: qty 500

## 2020-03-14 MED ORDER — VANCOMYCIN HCL IN DEXTROSE 1-5 GM/200ML-% IV SOLN
1000.0000 mg | Freq: Once | INTRAVENOUS | Status: AC
  Administered 2020-03-14: 16:00:00 1000 mg via INTRAVENOUS
  Filled 2020-03-14: qty 200

## 2020-03-14 MED ORDER — MIDAZOLAM HCL 5 MG/5ML IJ SOLN
INTRAMUSCULAR | Status: DC | PRN
  Administered 2020-03-14 (×3): 1 mg via INTRAVENOUS
  Administered 2020-03-14: 2 mg via INTRAVENOUS
  Administered 2020-03-14: 1 mg via INTRAVENOUS

## 2020-03-14 MED ORDER — SODIUM CHLORIDE 0.9 % IV SOLN: INTRAVENOUS | Status: DC

## 2020-03-14 MED ORDER — ONDANSETRON HCL 4 MG/2ML IJ SOLN: 4.0000 mg | Freq: Four times a day (QID) | INTRAMUSCULAR | Status: DC | PRN

## 2020-03-14 SURGICAL SUPPLY — 8 items
CABLE SURGICAL S-101-97-12 (CABLE) ×2 IMPLANT
CATH AGILIS 7F 10.5F 38 HISPRO (CATHETERS) ×1 IMPLANT
LEAD TENDRIL MRI 52CM LPA1200M (Lead) ×1 IMPLANT
LEAD TENDRIL MRI 58CM LPA1200M (Lead) ×1 IMPLANT
PACEMAKER ASSURITY DR-RF (Pacemaker) ×1 IMPLANT
PAD PRO RADIOLUCENT 2001M-C (PAD) ×2 IMPLANT
SHEATH 8FR PRELUDE SNAP 13 (SHEATH) ×2 IMPLANT
TRAY PACEMAKER INSERTION (PACKS) ×2 IMPLANT

## 2020-03-14 NOTE — Discharge Instructions (Signed)
    Supplemental Discharge Instructions for  Pacemaker/Defibrillator Patients  Activity No heavy lifting or vigorous activity with your left/right arm for 6 to 8 weeks.  Do not raise your left/right arm above your head for one week.  Gradually raise your affected arm as drawn below.              03/18/2020                 03/19/2020               03/20/2020               03/21/2020 __  NO DRIVING for  1 week   ; you may begin driving on   6/33/3545  .  WOUND CARE - Keep the wound area clean and dry.  Do not get this area wet for one week. No showers for one week; you may shower on 03/21/2020  . - Tomorrow, remove the arm sling - Tomorrow, remove the outer plastic bandage.  Underneath there are small paper tapes covering the wound.  DO NOT remove these. - The tape/steri-strips on your wound will fall off; do not pull them off.  No bandage is needed on the site.  DO  NOT apply any creams, oils, or ointments to the wound area. - If you notice any drainage or discharge from the wound, any swelling or bruising at the site, or you develop a fever > 101? F after you are discharged home, call the office at once.  Special Instructions - You are still able to use cellular telephones; use the ear opposite the side where you have your pacemaker/defibrillator.  Avoid carrying your cellular phone near your device. - When traveling through airports, show security personnel your identification card to avoid being screened in the metal detectors.  Ask the security personnel to use the hand wand. - Avoid arc welding equipment, MRI testing (magnetic resonance imaging), TENS units (transcutaneous nerve stimulators).  Call the office for questions about other devices. - Avoid electrical appliances that are in poor condition or are not properly grounded. - Microwave ovens are safe to be near or to operate.

## 2020-03-14 NOTE — Progress Notes (Signed)
Patient was given discharge instructions by PA. Patient verbalized understanding. 

## 2020-03-14 NOTE — Interval H&P Note (Signed)
History and Physical Interval Note:  03/14/2020 9:19 AM  Thomas Marshall  has presented today for surgery, with the diagnosis of Bradycardia.  The various methods of treatment have been discussed with the patient and family. After consideration of risks, benefits and other options for treatment, the patient has consented to  Procedure(s): PACEMAKER IMPLANT (N/A) as a surgical intervention.  The patient's history has been reviewed, patient examined, no change in status, stable for surgery.  I have reviewed the patient's chart and labs.  Questions were answered to the patient's satisfaction.     Cristopher Peru

## 2020-03-15 ENCOUNTER — Telehealth: Payer: Self-pay | Admitting: Emergency Medicine

## 2020-03-15 ENCOUNTER — Encounter: Payer: Self-pay | Admitting: Cardiology

## 2020-03-15 MED FILL — Heparin Sod (Porcine)-NaCl IV Soln 1000 Unit/500ML-0.9%: INTRAVENOUS | Qty: 500 | Status: AC

## 2020-03-15 MED FILL — Lidocaine HCl Local Inj 1%: INTRAMUSCULAR | Qty: 60 | Status: AC

## 2020-03-15 NOTE — Telephone Encounter (Signed)
Patient sent remote transmission. Device function WNL. Currently in AF with controlled v-rates. Hx AF, + Eliquis which is on hold but will resume on 03/18/20 per Dr Lovena Le. Education done on lifting restrictions and wound care.

## 2020-03-15 NOTE — Telephone Encounter (Signed)
Left HIPPA compliant message on machine that we received information we needed, and he does not to call back unless he has further questions.

## 2020-03-15 NOTE — Telephone Encounter (Signed)
Patient states he is calling to follow up in regards to device transmission sent today. Please call to discuss.

## 2020-03-19 DIAGNOSIS — C44329 Squamous cell carcinoma of skin of other parts of face: Secondary | ICD-10-CM | POA: Diagnosis not present

## 2020-03-19 DIAGNOSIS — L821 Other seborrheic keratosis: Secondary | ICD-10-CM | POA: Diagnosis not present

## 2020-03-19 DIAGNOSIS — L578 Other skin changes due to chronic exposure to nonionizing radiation: Secondary | ICD-10-CM | POA: Diagnosis not present

## 2020-03-19 DIAGNOSIS — L570 Actinic keratosis: Secondary | ICD-10-CM | POA: Diagnosis not present

## 2020-03-19 NOTE — Addendum Note (Signed)
Addended by: Rose Phi on: 03/19/2020 11:38 AM   Modules accepted: Orders

## 2020-03-21 ENCOUNTER — Telehealth: Payer: Self-pay

## 2020-03-21 NOTE — Telephone Encounter (Signed)
Patient's kidney Dr took him off of metformin due to being in AFIB and since he has been off the medicine his blood sugar has been running 120 or lower. The last 2 days it has been 135. Does he need to go back on something? He had pace maker put in last week.

## 2020-03-21 NOTE — Telephone Encounter (Signed)
Ok to hold new med for now, will see soon at next visit

## 2020-03-22 NOTE — Telephone Encounter (Signed)
Spoke with pt informed him of Dr. Jenny Reichmann advice. Pt understands and has no concerns and or questions at this time.

## 2020-03-25 ENCOUNTER — Other Ambulatory Visit: Payer: Self-pay

## 2020-03-25 ENCOUNTER — Ambulatory Visit (INDEPENDENT_AMBULATORY_CARE_PROVIDER_SITE_OTHER): Payer: Medicare Other | Admitting: *Deleted

## 2020-03-25 DIAGNOSIS — E538 Deficiency of other specified B group vitamins: Secondary | ICD-10-CM

## 2020-03-25 MED ORDER — CYANOCOBALAMIN 1000 MCG/ML IJ SOLN
1000.0000 ug | Freq: Once | INTRAMUSCULAR | Status: AC
  Administered 2020-03-25: 10:00:00 1000 ug via INTRAMUSCULAR

## 2020-03-25 NOTE — Progress Notes (Signed)
Pls cosign for B12 inj../lmb  

## 2020-03-28 ENCOUNTER — Ambulatory Visit (INDEPENDENT_AMBULATORY_CARE_PROVIDER_SITE_OTHER): Payer: Medicare Other | Admitting: Orthotics

## 2020-03-28 ENCOUNTER — Ambulatory Visit (INDEPENDENT_AMBULATORY_CARE_PROVIDER_SITE_OTHER): Payer: Medicare Other | Admitting: *Deleted

## 2020-03-28 ENCOUNTER — Other Ambulatory Visit: Payer: Self-pay

## 2020-03-28 DIAGNOSIS — E1140 Type 2 diabetes mellitus with diabetic neuropathy, unspecified: Secondary | ICD-10-CM | POA: Diagnosis not present

## 2020-03-28 DIAGNOSIS — I4819 Other persistent atrial fibrillation: Secondary | ICD-10-CM | POA: Diagnosis not present

## 2020-03-28 DIAGNOSIS — E1142 Type 2 diabetes mellitus with diabetic polyneuropathy: Secondary | ICD-10-CM | POA: Diagnosis not present

## 2020-03-28 DIAGNOSIS — L84 Corns and callosities: Secondary | ICD-10-CM

## 2020-03-28 DIAGNOSIS — I495 Sick sinus syndrome: Secondary | ICD-10-CM

## 2020-03-28 LAB — CUP PACEART INCLINIC DEVICE CHECK
Battery Remaining Longevity: 109 mo
Battery Voltage: 3.05 V
Brady Statistic RA Percent Paced: 0 %
Brady Statistic RV Percent Paced: 55 %
Date Time Interrogation Session: 20210429100800
Implantable Lead Implant Date: 20210415
Implantable Lead Implant Date: 20210415
Implantable Lead Location: 753859
Implantable Lead Location: 753860
Implantable Pulse Generator Implant Date: 20210415
Lead Channel Impedance Value: 525 Ohm
Lead Channel Impedance Value: 612.5 Ohm
Lead Channel Pacing Threshold Amplitude: 0.75 V
Lead Channel Pacing Threshold Pulse Width: 0.5 ms
Lead Channel Sensing Intrinsic Amplitude: 2.5 mV
Lead Channel Setting Pacing Amplitude: 3.5 V
Lead Channel Setting Pacing Amplitude: 3.5 V
Lead Channel Setting Pacing Pulse Width: 0.5 ms
Lead Channel Setting Sensing Sensitivity: 2 mV
Pulse Gen Model: 2272
Pulse Gen Serial Number: 3813712

## 2020-03-28 NOTE — Progress Notes (Signed)
Wound check appointment. Steri-strips removed. Wound without redness or edema. Incision edges approximated, wound well healed. Normal device function. Thresholds, sensing, and impedances consistent with implant measurements. Device programmed at 3.5V/auto capture programmed on for extra safety margin until 3 month visit. Histogram distribution appropriate for patient and level of activity. AT/AF burden 99% since implant , + Eliquis, 2 AMS episodes that show AF on going. Patient reports intermittent SOB with activity and lack of energy almost every day.No high ventricular rates noted. Patient educated about wound care, arm mobility, lifting restrictions. ROV with Dr Lovena Le on 06/25/20.

## 2020-03-28 NOTE — Progress Notes (Signed)

## 2020-04-01 ENCOUNTER — Telehealth: Payer: Self-pay | Admitting: Internal Medicine

## 2020-04-01 NOTE — Telephone Encounter (Signed)
Appt made with Thomas Marshall for 04/03/20 at 10:55 am for patients SOB to be evaluated. Pt made aware of when to call EMS versus calling the office back with new symptoms.

## 2020-04-01 NOTE — Telephone Encounter (Signed)
Patient called in because he has been increasingly SOB in the past couple of days. He does not keep a close watch on his weight but he does feel like he has put on a few pounds in the past two days. His most recent BP from this AM was 120/66 with a HR of 61. The patient reports worsening SOB on exertion. He has been having to sleep with his head elevated propped up on several pillows. He reports being dizzy from time to time and struggling with his ADL d/t being SOB. He states that he knows he has been in Afib since his pacemaker insertion.

## 2020-04-01 NOTE — Telephone Encounter (Signed)
New message  Pt c/o Shortness Of Breath: STAT if SOB developed within the last 24 hours or pt is noticeably SOB on the phone  1. Are you currently SOB (can you hear that pt is SOB on the phone)? No  2. How long have you been experiencing SOB? Yesterday during bath  3. Are you SOB when sitting or when up moving around? SOB on exertion  4. Are you currently experiencing any other symptoms? Dizziness, weak when exerting self, unstable when standing,

## 2020-04-01 NOTE — Telephone Encounter (Signed)
Thx - agree with plan

## 2020-04-02 NOTE — Progress Notes (Signed)
  Electrophysiology Office Note Date: 04/03/2020  ID:  Thomas Marshall, DOB 11/17/1941, MRN [id number]  PCP: John, James W, MD Primary Cardiologist: Karee Forge Cooper, MD Electrophysiologist: Gregg Taylor, MD   CC: Pacemaker follow-up  Thomas Marshall is a 78 y.o. male seen today for Gregg Taylor, MD for acute visit due to SOB.  Since last being seen in our clinic the patient reports dyspnea with even mild exertion. He has known AF. Had scheduled RN visit to see if still in AF next week and plan DCCV, though he was not aware of the purpose of that visit, so called with increased SOB. He denies chest pain, syncope, or near syncope. Denies orthopnea.  Device History: St. Jude Dual Chamber PPM implanted 03/14/2020 for SSS/Tachy-brady syndrome  Past Medical History:  Diagnosis Date  . AAA (abdominal aortic aneurysm) (HCC)   . Arthritis   . Cancer (HCC)    skin & squamous cell  . Carotid artery disease (HCC)    a. s/p R CEA 12/2015.  . CKD (chronic kidney disease), stage III    a. per historical labs.  . Coronary artery disease    a. s/p CABG 2004.  . Diabetes mellitus without complication (HCC)   . GERD (gastroesophageal reflux disease)   . Hyperlipidemia   . Hypertension   . Medication intolerance Multiple   . PAF (paroxysmal atrial fibrillation) (HCC)   . Pneumonia   . Sinus bradycardia    a. baseline HR 50s-60s, also h/o bradycardia on metoprolol and carvedilol   Past Surgical History:  Procedure Laterality Date  . ANKLE SURGERY     right fused  . BACK SURGERY     low back   . CARDIOVERSION N/A 02/14/2020   Procedure: CARDIOVERSION;  Surgeon: O'Neal, Daniel Thomas, MD;  [facility];  Service: Cardiovascular;  Laterality: N/A;  . CARDIOVERSION N/A 02/26/2020   Procedure: CARDIOVERSION;  Surgeon: Hilty, Kenneth C, MD;  [facility];  Service: Cardiovascular;  Laterality: N/A;  . CHOLECYSTECTOMY N/A 12/07/2013   Procedure: LAPAROSCOPIC CHOLECYSTECTOMY ;  Surgeon:  Matthew Wakefield, MD;  [facility] OR;  Service: General;  Laterality: N/A;  . COLONOSCOPY    . CORONARY ARTERY BYPASS GRAFT  2004   [place]  . CORONARY STENT INTERVENTION N/A 05/21/2017   Procedure: Coronary Stent Intervention;  Surgeon: Cooper, Tyse Auriemma, MD;  [facility];  Service: Cardiovascular;  Laterality: N/A;  . ENDARTERECTOMY Right 12/10/2015   Procedure: Right Carotid ENDARTERECTOMY;  Surgeon: Brian L Chen, MD;  [facility];  Service: Vascular;  Laterality: Right;  . EXPLORATION POST OPERATIVE OPEN HEART    . FINGER SURGERY     skin graft on rt index  . FINGER SURGERY Right    right index finger.  . HERNIA REPAIR Right    RIH  . JOINT REPLACEMENT    . KNEE SURGERY     replacement on both knees  . LEFT HEART CATH AND CORS/GRAFTS ANGIOGRAPHY N/A 05/21/2017   Procedure: Left Heart Cath and Cors/Grafts Angiography;  Surgeon: Cooper, Shakeisha Horine, MD;  [facility];  Service: Cardiovascular;  Laterality: N/A;  . PACEMAKER IMPLANT N/A 03/14/2020   Procedure: PACEMAKER IMPLANT;  Surgeon: Taylor, Gregg W, MD;  [facility];  Service: Cardiovascular;  Laterality: N/A;  . PROSTATE SURGERY    . PVC ABLATION N/A 10/27/2017   Procedure: PVC ABLATION;  Surgeon: Taylor, Gregg W, MD;  [facility];  Service: Cardiovascular;    Laterality: N/A;  . TONSILLECTOMY    . URINARY SURGERY     scar tissue    Current Outpatient Medications  Medication Sig Dispense Refill  . amiodarone (PACERONE) 200 MG tablet Take 1 tablet (200 mg total) by mouth 2 (two) times daily. 180 tablet 3  . amLODipine (NORVASC) 10 MG tablet Take 0.5 tablets (5 mg total) by mouth daily. 180 tablet 3  . apixaban (ELIQUIS) 2.5 MG TABS tablet Take 1 tablet (2.5 mg total) by mouth 2 (two) times daily. (0800 & 2000) 180 tablet 3  . aspirin EC 81 MG tablet Take 1 tablet (81 mg total) by mouth daily. 90 tablet 3  . atorvastatin (LIPITOR) 40 MG tablet Take 40 mg by  mouth at bedtime.     . Calcium-Magnesium-Zinc (CAL-MAG-ZINC PO) Take 1 tablet by mouth daily.    . Cholecalciferol (VITAMIN D-3) 25 MCG (1000 UT) CAPS Take 1,000 Units by mouth at bedtime.    . Cyanocobalamin 1000 MCG/ML KIT Inject 1,000 mcg as directed every 30 (thirty) days.    . glucose blood test strip Accu-Check Guide Strips. Dx E11.9 Use to test blood glucose level 2 times a day. 50 each 11  . hydrALAZINE (APRESOLINE) 10 MG tablet Take 10 mg by mouth 3 (three) times daily as needed (elevated bp greater than 160).    . magnesium oxide (MAG-OX) 400 MG tablet Take 400 mg by mouth 2 (two) times daily.    . metoprolol tartrate (LOPRESSOR) 25 MG tablet Take 0.5 tablets (12.5 mg total) by mouth 2 (two) times daily. 90 tablet 3  . nitroGLYCERIN (NITROSTAT) 0.4 MG SL tablet Place 1 tablet (0.4 mg total) under the tongue every 5 (five) minutes as needed for chest pain (x 3 doses). 30 tablet 3  . pantoprazole (PROTONIX) 40 MG tablet Take 1 tablet (40 mg total) by mouth daily. 90 tablet 3  . terazosin (HYTRIN) 1 MG capsule Take 1 mg by mouth at bedtime.  30 capsule 11  . vitamin C (ASCORBIC ACID) 500 MG tablet Take 500 mg by mouth daily with breakfast.     . Zinc 22.5 MG TABS Take 22.5 mg by mouth every other day.     No current facility-administered medications for this visit.    Allergies:   Lisinopril, Losartan, Nifedipine, Triamterene, Hydralazine hcl, Losartan potassium-hctz, Amoxicillin, Ampicillin, and Carvedilol   Social History: Social History   Socioeconomic History  . Marital status: Married    Spouse name: Not on file  . Number of children: 2  . Years of education: Not on file  . Highest education level: Not on file  Occupational History  . Occupation: retired  Tobacco Use  . Smoking status: Former Smoker    Packs/day: 1.00    Years: 13.00    Pack years: 13.00    Quit date: 11/30/1970    Years since quitting: 49.3  . Smokeless tobacco: Never Used  Substance and Sexual  Activity  . Alcohol use: No    Alcohol/week: 0.0 standard drinks  . Drug use: No  . Sexual activity: Yes  Other Topics Concern  . Not on file  Social History Narrative  . Not on file   Social Determinants of Health   Financial Resource Strain:   . Difficulty of Paying Living Expenses:   Food Insecurity:   . Worried About Running Out of Food in the Last Year:   . Ran Out of Food in the Last Year:   Transportation Needs:   .   Lack of Transportation (Medical):   . Lack of Transportation (Non-Medical):   Physical Activity: Sufficiently Active  . Days of Exercise per Week: 4 days  . Minutes of Exercise per Session: 40 min  Stress:   . Feeling of Stress :   Social Connections:   . Frequency of Communication with Friends and Family:   . Frequency of Social Gatherings with Friends and Family:   . Attends Religious Services:   . Active Member of Clubs or Organizations:   . Attends Club or Organization Meetings:   . Marital Status:   Intimate Partner Violence:   . Fear of Current or Ex-Partner:   . Emotionally Abused:   . Physically Abused:   . Sexually Abused:     Family History: Family History  Problem Relation Age of Onset  . Stroke Mother   . Heart disease Mother   . Heart disease Father   . Hypertension Father   . Hyperlipidemia Father   . Cancer Sister        breast  and skin  . Diabetes Sister   . Hypertension Sister   . Diabetes Brother   . Heart disease Brother   . Hyperlipidemia Brother   . Hypertension Brother   . Early death Neg Hx      Review of Systems: All other systems reviewed and are otherwise negative except as noted above.  Physical Exam: Vitals:   04/03/20 1043  BP: (!) 122/58  Pulse: 60  SpO2: 98%  Weight: 215 lb (97.5 kg)  Height: 6' 2" (1.88 m)     GEN- The patient is well appearing, alert and oriented x 3 today.   HEENT: normocephalic, atraumatic; sclera clear, conjunctiva pink; hearing intact; oropharynx clear; neck supple    Lungs- Clear to ausculation bilaterally, normal work of breathing.  No wheezes, rales, rhonchi Heart- Regular rhythm in setting of V pacing. no murmurs, rubs or gallops  GI- soft, non-tender, non-distended, bowel sounds present  Extremities- no clubbing, cyanosis, or edema  MS- no significant deformity or atrophy Skin- warm and dry, no rash or lesion; PPM pocket well healed Psych- euthymic mood, full affect Neuro- strength and sensation are intact  PPM Interrogation- reviewed in detail today,  See PACEART report  EKG:  EKG is not ordered today.  Recent Labs: 11/20/2019: Magnesium 1.9 01/25/2020: ALT 15; TSH 3.885 02/21/2020: BUN 30; Creatinine, Ser 2.11; Hemoglobin 10.6; Platelets 146; Potassium 4.6; Sodium 137   Wt Readings from Last 3 Encounters:  04/03/20 215 lb (97.5 kg)  03/14/20 215 lb (97.5 kg)  03/12/20 215 lb 6.4 oz (97.7 kg)     Other studies Reviewed: Additional studies/ records that were reviewed today include: Previous EP office notes, Previous remote checks, Most recent labwork.   Assessment and Plan:  1. Tachy-Brady syndrome s/p St. Jude PPM  Normal PPM function See Pace Art report No changes today  2. Persistent atrial fibrillation Continue eliquis 2.5 mg for CHA2DS2VASC of at least 8   His rates are controlled. He will have been back on his Eliquis for 3 weeks this coming Monday. Will plan for DCCV next week.  His volume status looks OK on exam and weight is stable.  Suspect his SOB will improve in NSR. Can consider lasix prn.  Echo 08/2017 LVEF 55-60%  Current medicines are reviewed at length with the patient today.   The patient does not have concerns regarding his medicines.  The following changes were made today:  none  Labs/ tests ordered   today include:  Orders Placed This Encounter  Procedures  . Basic Metabolic Panel (BMET)  . CBC  . CUP PACEART INCLINIC DEVICE CHECK     Disposition:  DCCV end of next week, AF clinic 10 days following.  Follow up with Dr. Taylor as scheduled for post device care.   Signed, Waleska Buttery Andrew Justine Cossin, PA-C  04/03/2020 1:56 PM  [facility] [address] [place] [address] [phone] (office) [phone] (fax)  

## 2020-04-02 NOTE — H&P (View-Only) (Signed)
Electrophysiology Office Note Date: 04/03/2020  ID:  Thomas, Marshall 03/21/1941, MRN [id number]  PCP: [name], MD Primary Cardiologist: [name], MD Electrophysiologist: Cristopher Peru, MD   CC: Pacemaker follow-up  Thomas Marshall is a 79 y.o. male seen today for Cristopher Peru, MD for acute visit due to SOB.  Since last being seen in our clinic the patient reports dyspnea with even mild exertion. He has known AF. Had scheduled RN visit to see if still in AF next week and plan DCCV, though he was not aware of the purpose of that visit, so called with increased SOB. He denies chest pain, syncope, or near syncope. Denies orthopnea.  Device History: St. Jude Dual Chamber PPM implanted 03/14/2020 for SSS/Tachy-brady syndrome  Past Medical History:  Diagnosis Date  . AAA (abdominal aortic aneurysm) ([place])   . Arthritis   . Cancer (HCC)    skin & squamous cell  . Carotid artery disease ([place])    a. s/p R CEA 12/2015.  [name] CKD (chronic kidney disease), stage III    a. per historical labs.  . Coronary artery disease    a. s/p CABG 2004.  . Diabetes mellitus without complication ([place])   . GERD (gastroesophageal reflux disease)   . Hyperlipidemia   . Hypertension   . Medication intolerance Multiple   . PAF (paroxysmal atrial fibrillation) ([place])   . Pneumonia   . Sinus bradycardia    a. baseline HR 50s-60s, also h/o bradycardia on metoprolol and carvedilol   Past Surgical History:  Procedure Laterality Date  . ANKLE SURGERY     right fused  . BACK SURGERY     low back   . CARDIOVERSION N/A 02/14/2020   Procedure: CARDIOVERSION;  Surgeon: Geralynn Rile, MD;  Location: [place];  Service: Cardiovascular;  Laterality: N/A;  . CARDIOVERSION N/A 02/26/2020   Procedure: CARDIOVERSION;  Surgeon: [name], MD;  Location[facility] ENDOSCOPY;  Service: Cardiovascular;  Laterality: N/A;  . CHOLECYSTECTOMY N/A 12/07/2013   Procedure: LAPAROSCOPIC CHOLECYSTECTOMY ;  Surgeon:  [name], MD;  Location: [place];  Service: General;  Laterality: N/A;  . COLONOSCOPY    . CORONARY ARTERY BYPASS GRAFT  2004   [place]  . CORONARY STENT INTERVENTION N/A 05/21/2017   Procedure: Coronary Stent Intervention;  Surgeon: [name], MD;  Location: [place] CV LAB;  Service: Cardiovascular;  Laterality: N/A;  . ENDARTERECTOMY Right 12/10/2015   Procedure: Right Carotid ENDARTERECTOMY;  Surgeon: Conrad Kenai, MD;  Location: [place];  Service: Vascular;  Laterality: Right;  . EXPLORATION POST OPERATIVE OPEN HEART    . FINGER SURGERY     skin graft on rt index  . FINGER SURGERY Right    right index finger.  [name] HERNIA REPAIR Right    RIH  . JOINT REPLACEMENT    . KNEE SURGERY     replacement on both knees  . LEFT HEART CATH AND CORS/GRAFTS ANGIOGRAPHY N/A 05/21/2017   Procedure: Left Heart Cath and Cors/Grafts Angiography;  Surgeon: [name], MD;  Location: [place] CV LAB;  Service: Cardiovascular;  Laterality: N/A;  . PACEMAKER IMPLANT N/A 03/14/2020   Procedure: PACEMAKER IMPLANT;  Surgeon: Evans Lance, MD;  Location: [place] CV LAB;  Service: Cardiovascular;  Laterality: N/A;  . PROSTATE SURGERY    . PVC ABLATION N/A 10/27/2017   Procedure: PVC ABLATION;  Surgeon: Evans Lance, MD;  Location: [place] CV LAB;  Service: Cardiovascular;  Laterality: N/A;  . TONSILLECTOMY    . URINARY SURGERY     scar tissue    Current Outpatient Medications  Medication Sig Dispense Refill  . amiodarone (PACERONE) 200 MG tablet Take 1 tablet (200 mg total) by mouth 2 (two) times daily. 180 tablet 3  . amLODipine (NORVASC) 10 MG tablet Take 0.5 tablets (5 mg total) by mouth daily. 180 tablet 3  . apixaban (ELIQUIS) 2.5 MG TABS tablet Take 1 tablet (2.5 mg total) by mouth 2 (two) times daily. (0800 & 2000) 180 tablet 3  . aspirin EC 81 MG tablet Take 1 tablet (81 mg total) by mouth daily. 90 tablet 3  . atorvastatin (LIPITOR) 40 MG tablet Take 40 mg by  mouth at bedtime.     . Calcium-Magnesium-Zinc (CAL-MAG-ZINC PO) Take 1 tablet by mouth daily.    . Cholecalciferol (VITAMIN D-3) 25 MCG (1000 UT) CAPS Take 1,000 Units by mouth at bedtime.    . Cyanocobalamin 1000 MCG/ML KIT Inject 1,000 mcg as directed every 30 (thirty) days.    [name] glucose blood test strip Accu-Check Guide Strips. Dx E11.9 Use to test blood glucose level 2 times a day. 50 each 11  . hydrALAZINE (APRESOLINE) 10 MG tablet Take 10 mg by mouth 3 (three) times daily as needed (elevated bp greater than 160).    . magnesium oxide (MAG-OX) 400 MG tablet Take 400 mg by mouth 2 (two) times daily.    . metoprolol tartrate (LOPRESSOR) 25 MG tablet Take 0.5 tablets (12.5 mg total) by mouth 2 (two) times daily. 90 tablet 3  . nitroGLYCERIN (NITROSTAT) 0.4 MG SL tablet Place 1 tablet (0.4 mg total) under the tongue every 5 (five) minutes as needed for chest pain (x 3 doses). 30 tablet 3  . pantoprazole (PROTONIX) 40 MG tablet Take 1 tablet (40 mg total) by mouth daily. 90 tablet 3  . terazosin (HYTRIN) 1 MG capsule Take 1 mg by mouth at bedtime.  30 capsule 11  . vitamin C (ASCORBIC ACID) 500 MG tablet Take 500 mg by mouth daily with breakfast.     . Zinc 22.5 MG TABS Take 22.5 mg by mouth every other day.     No current facility-administered medications for this visit.    Allergies:   Lisinopril, Losartan, Nifedipine, Triamterene, Hydralazine hcl, Losartan potassium-hctz, Amoxicillin, Ampicillin, and Carvedilol   Social History: Social History   Socioeconomic History  . Marital status: Married    Spouse name: Not on file  . Number of children: 2  . Years of education: Not on file  . Highest education level: Not on file  Occupational History  . Occupation: retired  Tobacco Use  . Smoking status: Former Smoker    Packs/day: 1.00    Years: 13.00    Pack years: 13.00    Quit date: 11/30/1970    Years since quitting: 49.3  . Smokeless tobacco: Never Used  Substance and Sexual  Activity  . Alcohol use: No    Alcohol/week: 0.0 standard drinks  . Drug use: No  . Sexual activity: Yes  Other Topics Concern  . Not on file  Social History Narrative  . Not on file   Social Determinants of Health   Financial Resource Strain:   . Difficulty of Paying Living Expenses:   Food Insecurity:   . Worried About Charity fundraiser in the Last Year:   . Arboriculturist in the Last Year:   Transportation Needs:   .  Lack of Transportation (Medical):   [name] Lack of Transportation (Non-Medical):   Physical Activity: Sufficiently Active  . Days of Exercise per Week: 4 days  . Minutes of Exercise per Session: 40 min  Stress:   . Feeling of Stress :   Social Connections:   . Frequency of Communication with Friends and Family:   . Frequency of Social Gatherings with Friends and Family:   . Attends Religious Services:   . Active Member of Clubs or Organizations:   . Attends Archivist Meetings:   [name] Marital Status:   Intimate Partner Violence:   . Fear of Current or Ex-Partner:   . Emotionally Abused:   [name] Physically Abused:   . Sexually Abused:     Family History: Family History  Problem Relation Age of Onset  . Stroke Mother   . Heart disease Mother   . Heart disease Father   . Hypertension Father   . Hyperlipidemia Father   . Cancer Sister        breast  and skin  . Diabetes Sister   . Hypertension Sister   . Diabetes Brother   . Heart disease Brother   . Hyperlipidemia Brother   . Hypertension Brother   . Early death Neg Hx      Review of Systems: All other systems reviewed and are otherwise negative except as noted above.  Physical Exam: Vitals:   04/03/20 1043  BP: (!) 122/58  Pulse: 60  SpO2: 98%  Weight: 215 lb (97.5 kg)  Height: _0  (1.88 m)     GEN- The patient is well appearing, alert and oriented x 3 today.   HEENT: normocephalic, atraumatic; sclera clear, conjunctiva pink; hearing intact; oropharynx clear; neck supple    Lungs- Clear to ausculation bilaterally, normal work of breathing.  No wheezes, rales, rhonchi Heart- Regular rhythm in setting of V pacing. no murmurs, rubs or gallops  GI- soft, non-tender, non-distended, bowel sounds present  Extremities- no clubbing, cyanosis, or edema  MS- no significant deformity or atrophy Skin- warm and dry, no rash or lesion; PPM pocket well healed Psych- euthymic mood, full affect Neuro- strength and sensation are intact  PPM Interrogation- reviewed in detail today,  See PACEART report  EKG:  EKG is not ordered today.  Recent Labs: 11/20/2019: Magnesium 1.9 01/25/2020: ALT 15; TSH 3.885 02/21/2020: BUN 30; Creatinine, Ser 2.11; Hemoglobin 10.6; Platelets 146; Potassium 4.6; Sodium 137   Wt Readings from Last 3 Encounters:  04/03/20 215 lb (97.5 kg)  03/14/20 215 lb (97.5 kg)  03/12/20 215 lb 6.4 oz (97.7 kg)     Other studies Reviewed: Additional studies/ records that were reviewed today include: Previous EP office notes, Previous remote checks, Most recent labwork.   Assessment and Plan:  1. Tachy-Brady syndrome s/p St. Jude PPM  Normal PPM function See Pace Art report No changes today  2. Persistent atrial fibrillation Continue eliquis 2.5 mg for CHA2DS2VASC of at least 8   His rates are controlled. He will have been back on his Eliquis for 3 weeks this coming Monday. Will plan for DCCV next week.  His volume status looks OK on exam and weight is stable.  Suspect his SOB will improve in NSR. Can consider lasix prn.  Echo 08/2017 LVEF 55-60%  Current medicines are reviewed at length with the patient today.   The patient does not have concerns regarding his medicines.  The following changes were made today:  none  Labs/ tests ordered  today include:  Orders Placed This Encounter  Procedures  . Basic Metabolic Panel (BMET)  . CBC  . CUP PACEART INCLINIC DEVICE CHECK     Disposition:  DCCV end of next week, AF clinic 10 days following.  Follow up with [name] as scheduled for post device care.   Jacalyn Lefevre, PA-C  04/03/2020 1:56 PM  [facility] [place] [address] [phone] (office) [phone] (fax)

## 2020-04-03 ENCOUNTER — Other Ambulatory Visit: Payer: Self-pay

## 2020-04-03 ENCOUNTER — Other Ambulatory Visit: Payer: Self-pay | Admitting: Student

## 2020-04-03 ENCOUNTER — Encounter: Payer: Self-pay | Admitting: Student

## 2020-04-03 ENCOUNTER — Ambulatory Visit (INDEPENDENT_AMBULATORY_CARE_PROVIDER_SITE_OTHER): Payer: Medicare Other | Admitting: Student

## 2020-04-03 VITALS — BP 122/58 | HR 60 | Ht 74.0 in | Wt 215.0 lb

## 2020-04-03 DIAGNOSIS — Z01812 Encounter for preprocedural laboratory examination: Secondary | ICD-10-CM | POA: Diagnosis not present

## 2020-04-03 DIAGNOSIS — I495 Sick sinus syndrome: Secondary | ICD-10-CM | POA: Diagnosis not present

## 2020-04-03 DIAGNOSIS — I4819 Other persistent atrial fibrillation: Secondary | ICD-10-CM

## 2020-04-03 DIAGNOSIS — I6523 Occlusion and stenosis of bilateral carotid arteries: Secondary | ICD-10-CM | POA: Diagnosis not present

## 2020-04-03 LAB — CUP PACEART INCLINIC DEVICE CHECK
Battery Remaining Longevity: 98 mo
Battery Voltage: 3.02 V
Brady Statistic RA Percent Paced: 0 %
Brady Statistic RV Percent Paced: 78 %
Date Time Interrogation Session: 20210505135256
Implantable Lead Implant Date: 20210415
Implantable Lead Implant Date: 20210415
Implantable Lead Location: 753859
Implantable Lead Location: 753860
Implantable Pulse Generator Implant Date: 20210415
Lead Channel Impedance Value: 512.5 Ohm
Lead Channel Impedance Value: 612.5 Ohm
Lead Channel Pacing Threshold Amplitude: 0.5 V
Lead Channel Pacing Threshold Amplitude: 0.5 V
Lead Channel Pacing Threshold Pulse Width: 0.5 ms
Lead Channel Pacing Threshold Pulse Width: 0.5 ms
Lead Channel Sensing Intrinsic Amplitude: 12 mV
Lead Channel Setting Pacing Amplitude: 3.5 V
Lead Channel Setting Pacing Amplitude: 3.5 V
Lead Channel Setting Pacing Pulse Width: 0.5 ms
Lead Channel Setting Sensing Sensitivity: 2 mV
Pulse Gen Model: 2272
Pulse Gen Serial Number: 3813712

## 2020-04-03 LAB — BASIC METABOLIC PANEL
BUN/Creatinine Ratio: 13 (ref 10–24)
BUN: 28 mg/dL — ABNORMAL HIGH (ref 8–27)
CO2: 28 mmol/L (ref 20–29)
Calcium: 9 mg/dL (ref 8.6–10.2)
Chloride: 103 mmol/L (ref 96–106)
Creatinine, Ser: 2.17 mg/dL — ABNORMAL HIGH (ref 0.76–1.27)
GFR calc Af Amer: 32 mL/min/{1.73_m2} — ABNORMAL LOW (ref >59)
GFR calc non Af Amer: 28 mL/min/{1.73_m2} — ABNORMAL LOW (ref >59)
Glucose: 91 mg/dL (ref 65–99)
Potassium: 4.9 mmol/L (ref 3.5–5.2)
Sodium: 140 mmol/L (ref 134–144)

## 2020-04-03 LAB — CBC
Hematocrit: 31 % — ABNORMAL LOW (ref 37.5–51.0)
Hemoglobin: 10.1 g/dL — ABNORMAL LOW (ref 13.0–17.7)
MCH: 29.4 pg (ref 26.6–33.0)
MCHC: 32.6 g/dL (ref 31.5–35.7)
MCV: 90 fL (ref 79–97)
Platelets: 144 10*3/uL — ABNORMAL LOW (ref 150–450)
RBC: 3.43 x10E6/uL — ABNORMAL LOW (ref 4.14–5.80)
RDW: 14.8 % (ref 11.6–15.4)
WBC: 5.6 10*3/uL (ref 3.4–10.8)

## 2020-04-03 NOTE — Patient Instructions (Addendum)
Medication Instructions:  none *If you need a refill on your cardiac medications before your next appointment, please call your pharmacy*   Lab Work:  TODAY BMET CBC If you have labs (blood work) drawn today and your tests are completely normal, you will receive your results only by: [name] MyChart Message (if you have MyChart) OR . A paper copy in the mail If you have any lab test that is abnormal or we need to change your treatment, we will call you to review the results.   Testing/Procedures: Your physician has recommended that you have a Cardioversion (DCCV). Electrical Cardioversion uses a jolt of electricity to your heart either through paddles or wired patches attached to your chest. This is a controlled, usually prescheduled, procedure. Defibrillation is done under light anesthesia in the hospital, and you usually go home the day of the procedure. This is done to get your heart back into a normal rhythm. You are not awake for the procedure. Please see the instruction sheet given to you today.      Other Instructions Remote monitoring is used to monitor your Pacemaker  from home. This monitoring reduces the number of office visits required to check your device to one time per year. It allows [place] to keep an eye on the functioning of your device to ensure it is working properly. You are scheduled for a device check from home on 06/23/20. You may send your transmission at any time that day. If you have a wireless device, the transmission will be sent automatically. After your physician reviews your transmission, you will receive a postcard with your next transmission date.   Dear Mr Marbach  You are scheduled for a Cardioversion with Dr. Oval Linsey.  Please arrive at the [facility] (Main Entrance A) at [facility]: [address] [place],  [address] at 9:00 am.  DIET: Nothing to eat or drink after midnight except a sip of water with medications (see medication instructions  below)    You must have a responsible person to drive you home and stay in the waiting area during your procedure. Failure to do so could result in cancellation.  Bring your insurance cards.  *Special Note: Every effort is made to have your procedure done on time. Occasionally there are emergencies that occur at the hospital that may cause delays. Please be patient if a delay does occur.    CoVid Test:  [facility]                      [place], [place]                       Tuesday 5/11 @ 10:00 am

## 2020-04-03 NOTE — Progress Notes (Signed)
error 

## 2020-04-03 NOTE — H&P (View-Only) (Signed)
error 

## 2020-04-09 ENCOUNTER — Other Ambulatory Visit (HOSPITAL_COMMUNITY)
Admission: RE | Admit: 2020-04-09 | Discharge: 2020-04-09 | Disposition: A | Discharge status: Home / Self Care | Payer: Medicare Other | Source: Ambulatory Visit | Attending: Cardiovascular Disease | Admitting: Cardiovascular Disease

## 2020-04-09 DIAGNOSIS — Z20822 Contact with and (suspected) exposure to covid-19: Secondary | ICD-10-CM | POA: Diagnosis not present

## 2020-04-09 DIAGNOSIS — Z01812 Encounter for preprocedural laboratory examination: Secondary | ICD-10-CM | POA: Diagnosis not present

## 2020-04-09 LAB — SARS CORONAVIRUS 2 (TAT 6-24 HRS): SARS Coronavirus 2: NEGATIVE

## 2020-04-10 ENCOUNTER — Ambulatory Visit: Payer: Medicare Other

## 2020-04-11 ENCOUNTER — Other Ambulatory Visit: Payer: Self-pay

## 2020-04-12 ENCOUNTER — Ambulatory Visit (HOSPITAL_COMMUNITY)
Admission: RE | Admit: 2020-04-12 | Discharge: 2020-04-12 | Disposition: A | Discharge status: Home / Self Care | Payer: Medicare Other | Attending: Cardiovascular Disease | Admitting: Cardiovascular Disease

## 2020-04-12 ENCOUNTER — Encounter (HOSPITAL_COMMUNITY)
Admission: RE | Disposition: A | Discharge status: Home / Self Care | Payer: Self-pay | Source: Home / Self Care | Attending: Cardiovascular Disease

## 2020-04-12 ENCOUNTER — Encounter (HOSPITAL_COMMUNITY): Payer: Self-pay | Admitting: Cardiovascular Disease

## 2020-04-12 ENCOUNTER — Ambulatory Visit (HOSPITAL_COMMUNITY): Payer: Medicare Other | Admitting: Certified Registered Nurse Anesthetist

## 2020-04-12 DIAGNOSIS — Z7982 Long term (current) use of aspirin: Secondary | ICD-10-CM | POA: Diagnosis not present

## 2020-04-12 DIAGNOSIS — Z955 Presence of coronary angioplasty implant and graft: Secondary | ICD-10-CM | POA: Diagnosis not present

## 2020-04-12 DIAGNOSIS — Z79899 Other long term (current) drug therapy: Secondary | ICD-10-CM | POA: Diagnosis not present

## 2020-04-12 DIAGNOSIS — I6521 Occlusion and stenosis of right carotid artery: Secondary | ICD-10-CM | POA: Insufficient documentation

## 2020-04-12 DIAGNOSIS — N183 Chronic kidney disease, stage 3 unspecified: Secondary | ICD-10-CM | POA: Insufficient documentation

## 2020-04-12 DIAGNOSIS — I4819 Other persistent atrial fibrillation: Secondary | ICD-10-CM | POA: Insufficient documentation

## 2020-04-12 DIAGNOSIS — E1122 Type 2 diabetes mellitus with diabetic chronic kidney disease: Secondary | ICD-10-CM | POA: Diagnosis not present

## 2020-04-12 DIAGNOSIS — Z951 Presence of aortocoronary bypass graft: Secondary | ICD-10-CM | POA: Insufficient documentation

## 2020-04-12 DIAGNOSIS — Z950 Presence of cardiac pacemaker: Secondary | ICD-10-CM | POA: Insufficient documentation

## 2020-04-12 DIAGNOSIS — Z8249 Family history of ischemic heart disease and other diseases of the circulatory system: Secondary | ICD-10-CM | POA: Insufficient documentation

## 2020-04-12 DIAGNOSIS — K219 Gastro-esophageal reflux disease without esophagitis: Secondary | ICD-10-CM | POA: Diagnosis not present

## 2020-04-12 DIAGNOSIS — Z888 Allergy status to other drugs, medicaments and biological substances status: Secondary | ICD-10-CM | POA: Diagnosis not present

## 2020-04-12 DIAGNOSIS — E785 Hyperlipidemia, unspecified: Secondary | ICD-10-CM | POA: Insufficient documentation

## 2020-04-12 DIAGNOSIS — I495 Sick sinus syndrome: Secondary | ICD-10-CM | POA: Insufficient documentation

## 2020-04-12 DIAGNOSIS — I129 Hypertensive chronic kidney disease with stage 1 through stage 4 chronic kidney disease, or unspecified chronic kidney disease: Secondary | ICD-10-CM | POA: Insufficient documentation

## 2020-04-12 DIAGNOSIS — Z880 Allergy status to penicillin: Secondary | ICD-10-CM | POA: Diagnosis not present

## 2020-04-12 DIAGNOSIS — Z7901 Long term (current) use of anticoagulants: Secondary | ICD-10-CM | POA: Diagnosis not present

## 2020-04-12 DIAGNOSIS — I714 Abdominal aortic aneurysm, without rupture: Secondary | ICD-10-CM | POA: Insufficient documentation

## 2020-04-12 DIAGNOSIS — I2510 Atherosclerotic heart disease of native coronary artery without angina pectoris: Secondary | ICD-10-CM | POA: Insufficient documentation

## 2020-04-12 DIAGNOSIS — Z87891 Personal history of nicotine dependence: Secondary | ICD-10-CM | POA: Diagnosis not present

## 2020-04-12 DIAGNOSIS — I480 Paroxysmal atrial fibrillation: Secondary | ICD-10-CM | POA: Insufficient documentation

## 2020-04-12 HISTORY — PX: CARDIOVERSION: SHX1299

## 2020-04-12 SURGERY — CARDIOVERSION
Anesthesia: Monitor Anesthesia Care

## 2020-04-12 MED ORDER — LIDOCAINE 2% (20 MG/ML) 5 ML SYRINGE
INTRAMUSCULAR | Status: DC | PRN
  Administered 2020-04-12: 40 mg via INTRAVENOUS

## 2020-04-12 MED ORDER — PROPOFOL 10 MG/ML IV BOLUS
INTRAVENOUS | Status: DC | PRN
  Administered 2020-04-12: 70 mg via INTRAVENOUS

## 2020-04-12 MED ORDER — SODIUM CHLORIDE 0.9 % IV SOLN
INTRAVENOUS | Status: AC | PRN
  Administered 2020-04-12: 500 mL via INTRAVENOUS

## 2020-04-12 NOTE — Interval H&P Note (Signed)
History and Physical Interval Note:  04/12/2020 8:49 AM  Thomas Marshall  has presented today for surgery, with the diagnosis of PERSISTENT AFIB.  The various methods of treatment have been discussed with the patient and family. After consideration of risks, benefits and other options for treatment, the patient has consented to  Procedure(s): CARDIOVERSION (N/A) as a surgical intervention.  The patient's history has been reviewed, patient examined, no change in status, stable for surgery.  I have reviewed the patient's chart and labs.  Questions were answered to the patient's satisfaction.     Skeet Latch, MD

## 2020-04-12 NOTE — Interval H&P Note (Signed)
History and Physical Interval Note:  04/12/2020 8:51 AM  Thomas Marshall  has presented today for surgery, with the diagnosis of PERSISTENT AFIB.  The various methods of treatment have been discussed with the patient and family. After consideration of risks, benefits and other options for treatment, the patient has consented to  Procedure(s): CARDIOVERSION (N/A) as a surgical intervention.  The patient's history has been reviewed, patient examined, no change in status, stable for surgery.  I have reviewed the patient's chart and labs.  Questions were answered to the patient's satisfaction.     Skeet Latch

## 2020-04-12 NOTE — Interval H&P Note (Signed)
History and Physical Interval Note:  04/12/2020 8:50 AM  Thomas Marshall  has presented today for surgery, with the diagnosis of PERSISTENT AFIB.  The various methods of treatment have been discussed with the patient and family. After consideration of risks, benefits and other options for treatment, the patient has consented to  Procedure(s): CARDIOVERSION (N/A) as a surgical intervention.  The patient's history has been reviewed, patient examined, no change in status, stable for surgery.  I have reviewed the patient's chart and labs.  Questions were answered to the patient's satisfaction.     Skeet Latch

## 2020-04-12 NOTE — Anesthesia Postprocedure Evaluation (Signed)
Anesthesia Post Note  Patient: Thomas Marshall  Procedure(s) Performed: CARDIOVERSION (N/A )     Patient location during evaluation: Endoscopy Anesthesia Type: General Level of consciousness: awake and alert Pain management: pain level controlled Vital Signs Assessment: post-procedure vital signs reviewed and stable Respiratory status: spontaneous breathing, nonlabored ventilation, respiratory function stable and patient connected to nasal cannula oxygen Cardiovascular status: blood pressure returned to baseline and stable Postop Assessment: no apparent nausea or vomiting Anesthetic complications: no    Last Vitals:  Vitals:   04/12/20 0950 04/12/20 1000  BP: 138/78 (!) 157/74  Pulse: 60 (!) 58  Resp: 11 15  Temp:    SpO2: 98% 97%    Last Pain:  Vitals:   04/12/20 1000  TempSrc:   PainSc: 0-No pain                 Anthem Frazer COKER

## 2020-04-12 NOTE — CV Procedure (Signed)
Electrical Cardioversion Procedure Note KOBI MARIO [id number] Apr 11, 1941  Procedure: Electrical Cardioversion Indications:  Atrial Fibrillation  Procedure Details Consent: Risks of procedure as well as the alternatives and risks of each were explained to the (patient/caregiver).  Consent for procedure obtained. Time Out: Verified patient identification, verified procedure, site/side was marked, verified correct patient position, special equipment/implants available, medications/allergies/relevent history reviewed, required imaging and test results available.  Performed  Patient placed on cardiac monitor, pulse oximetry, supplemental oxygen as necessary.  Sedation given: propofol Pacer pads placed anterior and posterior chest.  Cardioverted 1 time(s).  Cardioverted at 150J.  Evaluation Findings: Post procedure EKG shows: APVP Complications: None Patient did tolerate procedure well.   Skeet Latch, MD 04/12/2020, 9:36 AM

## 2020-04-12 NOTE — Discharge Instructions (Signed)
Electrical Cardioversion Electrical cardioversion is the delivery of a jolt of electricity to restore a normal rhythm to the heart. A rhythm that is too fast or is not regular keeps the heart from pumping well. In this procedure, sticky patches or metal paddles are placed on the chest to deliver electricity to the heart from a device. This procedure may be done in an emergency if:  There is low or no blood pressure as a result of the heart rhythm.  Normal rhythm must be restored as fast as possible to protect the brain and heart from further damage.  It may save a life. This may also be a scheduled procedure for irregular or fast heart rhythms that are not immediately life-threatening. Tell a health care provider about:  Any allergies you have.  All medicines you are taking, including vitamins, herbs, eye drops, creams, and over-the-counter medicines.  Any problems you or family members have had with anesthetic medicines.  Any blood disorders you have.  Any surgeries you have had.  Any medical conditions you have.  Whether you are pregnant or may be pregnant. What are the risks? Generally, this is a safe procedure. However, problems may occur, including:  Allergic reactions to medicines.  A blood clot that breaks free and travels to other parts of your body.  The possible return of an abnormal heart rhythm within hours or days after the procedure.  Your heart stopping (cardiac arrest). This is rare. What happens before the procedure? Medicines  Your health care provider may have you start taking: ? Blood-thinning medicines (anticoagulants) so your blood does not clot as easily. ? Medicines to help stabilize your heart rate and rhythm.  Ask your health care provider about: ? Changing or stopping your regular medicines. This is especially important if you are taking diabetes medicines or blood thinners. ? Taking medicines such as aspirin and ibuprofen. These medicines can  thin your blood. Do not take these medicines unless your health care provider tells you to take them. ? Taking over-the-counter medicines, vitamins, herbs, and supplements. General instructions  Follow instructions from your health care provider about eating or drinking restrictions.  Plan to have someone take you home from the hospital or clinic.  If you will be going home right after the procedure, plan to have someone with you for 24 hours.  Ask your health care provider what steps will be taken to help prevent infection. These may include washing your skin with a germ-killing soap. What happens during the procedure?   An IV will be inserted into one of your veins.  Sticky patches (electrodes) or metal paddles may be placed on your chest.  You will be given a medicine to help you relax (sedative).  An electrical shock will be delivered. The procedure may vary among health care providers and hospitals. What can I expect after the procedure?  Your blood pressure, heart rate, breathing rate, and blood oxygen level will be monitored until you leave the hospital or clinic.  Your heart rhythm will be watched to make sure it does not change.  You may have some redness on the skin where the shocks were given. Follow these instructions at home:  Do not drive for 24 hours if you were given a sedative during your procedure.  Take over-the-counter and prescription medicines only as told by your health care provider.  Ask your health care provider how to check your pulse. Check it often.  Rest for 48 hours after the procedure or   as told by your health care provider.  Avoid or limit your caffeine use as told by your health care provider.  Keep all follow-up visits as told by your health care provider. This is important. Contact a health care provider if:  You feel like your heart is beating too quickly or your pulse is not regular.  You have a serious muscle cramp that does not go  away. Get help right away if:  You have discomfort in your chest.  You are dizzy or you feel faint.  You have trouble breathing or you are short of breath.  Your speech is slurred.  You have trouble moving an arm or leg on one side of your body.  Your fingers or toes turn cold or blue. Summary  Electrical cardioversion is the delivery of a jolt of electricity to restore a normal rhythm to the heart.  This procedure may be done right away in an emergency or may be a scheduled procedure if the condition is not an emergency.  Generally, this is a safe procedure.  After the procedure, check your pulse often as told by your health care provider. This information is not intended to replace advice given to you by your health care provider. Make sure you discuss any questions you have with your health care provider. Document Revised: 06/19/2019 Document Reviewed: 06/19/2019 Elsevier Patient Education  2020 Elsevier Inc[employer]   Monitored Anesthesia Care, Care After These instructions provide you with information about caring for yourself after your procedure. Your health care provider may also give you more specific instructions. Your treatment has been planned according to current medical practices, but problems sometimes occur. Call your health care provider if you have any problems or questions after your procedure. What can I expect after the procedure? After your procedure, you may:  Feel sleepy for several hours.  Feel clumsy and have poor balance for several hours.  Feel forgetful about what happened after the procedure.  Have poor judgment for several hours.  Feel nauseous or vomit.  Have a sore throat if you had a breathing tube during the procedure. Follow these instructions at home: For at least 24 hours after the procedure:      Have a responsible adult stay with you. It is important to have someone help care for you until you are awake and alert.  Rest as  needed.  Do not: ? Participate in activities in which you could fall or become injured. ? Drive. ? Use heavy machinery. ? Drink alcohol. ? Take sleeping pills or medicines that cause drowsiness. ? Make important decisions or sign legal documents. ? Take care of children on your own. Eating and drinking  Follow the diet that is recommended by your health care provider.  If you vomit, drink water, juice, or soup when you can drink without vomiting.  Make sure you have little or no nausea before eating solid foods. General instructions  Take over-the-counter and prescription medicines only as told by your health care provider.  If you have sleep apnea, surgery and certain medicines can increase your risk for breathing problems. Follow instructions from your health care provider about wearing your sleep device: ? Anytime you are sleeping, including during daytime naps. ? While taking prescription pain medicines, sleeping medicines, or medicines that make you drowsy.  If you smoke, do not smoke without supervision.  Keep all follow-up visits as told by your health care provider. This is important. Contact a health care provider if:  You   keep feeling nauseous or you keep vomiting.  You feel light-headed.  You develop a rash.  You have a fever. Get help right away if:  You have trouble breathing. Summary  For several hours after your procedure, you may feel sleepy and have poor judgment.  Have a responsible adult stay with you for at least 24 hours or until you are awake and alert. This information is not intended to replace advice given to you by your health care provider. Make sure you discuss any questions you have with your health care provider. Document Revised: 02/14/2018 Document Reviewed: 03/08/2016 Elsevier Patient Education  2020 Elsevier Inc.  

## 2020-04-12 NOTE — Anesthesia Preprocedure Evaluation (Signed)
Anesthesia Evaluation  Patient identified by MRN, date of birth, ID band Patient awake    Reviewed: Allergy & Precautions, NPO status , Patient's Chart, lab work & pertinent test results  Airway Mallampati: II  TM Distance: >3 FB Neck ROM: Full    Dental  (+) Teeth Intact   Pulmonary former smoker,    breath sounds clear to auscultation       Cardiovascular hypertension,  Rhythm:Irregular Rate:Normal     Neuro/Psych    GI/Hepatic   Endo/Other  diabetes  Renal/GU      Musculoskeletal   Abdominal   Peds  Hematology   Anesthesia Other Findings   Reproductive/Obstetrics                             Anesthesia Physical Anesthesia Plan  ASA: III  Anesthesia Plan: General   Post-op Pain Management:    Induction: Intravenous  PONV Risk Score and Plan:   Airway Management Planned: Mask  Additional Equipment:   Intra-op Plan:   Post-operative Plan:   Informed Consent: I have reviewed the patients History and Physical, chart, labs and discussed the procedure including the risks, benefits and alternatives for the proposed anesthesia with the patient or authorized representative who has indicated his/her understanding and acceptance.       Plan Discussed with: CRNA and Anesthesiologist  Anesthesia Plan Comments:         Anesthesia Quick Evaluation

## 2020-04-12 NOTE — Anesthesia Procedure Notes (Signed)
Procedure Name: General with mask airway Date/Time: 04/12/2020 9:28 AM Performed by: Janene Harvey, CRNA Pre-anesthesia Checklist: Patient identified, Emergency Drugs available, Suction available and Patient being monitored Patient Re-evaluated:Patient Re-evaluated prior to induction Oxygen Delivery Method: Ambu bag Placement Confirmation: positive ETCO2 Dental Injury: Teeth and Oropharynx as per pre-operative assessment

## 2020-04-12 NOTE — Transfer of Care (Signed)
Immediate Anesthesia Transfer of Care Note  Patient: Thomas Marshall  Procedure(s) Performed: CARDIOVERSION (N/A )  Patient Location: Endoscopy Unit  Anesthesia Type:General  Level of Consciousness: drowsy  Airway & Oxygen Therapy: Patient Spontanous Breathing and Patient connected to nasal cannula oxygen  Post-op Assessment: Report given to RN and Post -op Vital signs reviewed and stable  Post vital signs: Reviewed  Last Vitals:  Vitals Value Taken Time  BP    Temp    Pulse    Resp    SpO2      Last Pain:  Vitals:   04/12/20 0815  TempSrc: Oral  PainSc: 0-No pain         Complications: No apparent anesthesia complications

## 2020-04-15 ENCOUNTER — Ambulatory Visit (INDEPENDENT_AMBULATORY_CARE_PROVIDER_SITE_OTHER): Payer: Medicare Other | Admitting: Internal Medicine

## 2020-04-15 ENCOUNTER — Encounter: Payer: Self-pay | Admitting: Internal Medicine

## 2020-04-15 VITALS — BP 130/72 | HR 60 | Temp 97.9°F | Ht 74.0 in | Wt 215.0 lb

## 2020-04-15 DIAGNOSIS — E1122 Type 2 diabetes mellitus with diabetic chronic kidney disease: Secondary | ICD-10-CM | POA: Diagnosis not present

## 2020-04-15 DIAGNOSIS — E7800 Pure hypercholesterolemia, unspecified: Secondary | ICD-10-CM

## 2020-04-15 DIAGNOSIS — N1832 Chronic kidney disease, stage 3b: Secondary | ICD-10-CM

## 2020-04-15 DIAGNOSIS — E538 Deficiency of other specified B group vitamins: Secondary | ICD-10-CM

## 2020-04-15 DIAGNOSIS — E1121 Type 2 diabetes mellitus with diabetic nephropathy: Secondary | ICD-10-CM | POA: Diagnosis not present

## 2020-04-15 DIAGNOSIS — I10 Essential (primary) hypertension: Secondary | ICD-10-CM

## 2020-04-15 DIAGNOSIS — I6523 Occlusion and stenosis of bilateral carotid arteries: Secondary | ICD-10-CM | POA: Diagnosis not present

## 2020-04-15 DIAGNOSIS — N182 Chronic kidney disease, stage 2 (mild): Secondary | ICD-10-CM

## 2020-04-15 MED ORDER — LANCETS MISC: 3 refills | Status: DC

## 2020-04-15 MED ORDER — VITAMIN B-12 1000 MCG PO TABS: 1000.0000 ug | ORAL_TABLET | Freq: Every day | ORAL | 1 refills | Status: DC

## 2020-04-15 MED ORDER — CYANOCOBALAMIN 1000 MCG/ML IJ SOLN
1000.0000 ug | Freq: Once | INTRAMUSCULAR | Status: AC
  Administered 2020-04-15: 1000 ug via INTRAMUSCULAR

## 2020-04-15 MED ORDER — GLUCOSE BLOOD VI STRP: ORAL_STRIP | 11 refills | Status: DC

## 2020-04-15 NOTE — Patient Instructions (Addendum)
You had the B12 shot today, then ok to change to the pills  Please continue all other medications as before, and refills have been done if requested.  Please have the pharmacy call with any other refills you may need.  Please continue your efforts at being more active, low cholesterol diet, and weight control.  You are otherwise up to date with prevention measures today.  Please keep your appointments with your specialists as you may have planned  Please make an Appointment to return in 6 months, or sooner if needed

## 2020-04-15 NOTE — Progress Notes (Signed)
Subjective:    Patient ID: Thomas Marshall, male    DOB: [birth year], 79 y.o.   MRN: [id number]  HPI   Here to f/u; overall doing ok,  Pt denies chest pain, increasing sob or doe, wheezing, orthopnea, PND, increased LE swelling, palpitations, dizziness or syncope.  Pt denies new neurological symptoms such as new headache, or facial or extremity weakness or numbness.  Pt denies polydipsia, polyuria, or low sugar episode.  Pt states overall good compliance with meds, mostly trying to follow appropriate diet, with wt overall stable,  but little exercise however.  No new complaints Past Medical History:  Diagnosis Date  . AAA (abdominal aortic aneurysm) ([place])   . Arthritis   . Cancer (HCC)    skin & squamous cell  . Carotid artery disease ([place])    a. s/p R CEA 12/2015.  [name] CKD (chronic kidney disease), stage III    a. per historical labs.  . Coronary artery disease    a. s/p CABG 2004.  . Diabetes mellitus without complication ([place])   . GERD (gastroesophageal reflux disease)   . Hyperlipidemia   . Hypertension   . Medication intolerance Multiple   . PAF (paroxysmal atrial fibrillation) ([place])   . Pneumonia   . Sinus bradycardia    a. baseline HR 50s-60s, also h/o bradycardia on metoprolol and carvedilol   Past Surgical History:  Procedure Laterality Date  . ANKLE SURGERY     right fused  . BACK SURGERY     low back   . CARDIOVERSION N/A 02/14/2020   Procedure: CARDIOVERSION;  Surgeon: Geralynn Rile, MD;  Location: [facility];  Service: Cardiovascular;  Laterality: N/A;  . CARDIOVERSION N/A 02/26/2020   Procedure: CARDIOVERSION;  Surgeon: [name], MD;  Location[facility] ENDOSCOPY;  Service: Cardiovascular;  Laterality: N/A;  . CARDIOVERSION N/A 04/12/2020   Procedure: CARDIOVERSION;  Surgeon: Skeet Latch, MD;  Location: [facility] ENDOSCOPY;  Service: Cardiovascular;  Laterality: N/A;  . CHOLECYSTECTOMY N/A 12/07/2013   Procedure: LAPAROSCOPIC CHOLECYSTECTOMY ;  Surgeon: [name], MD;  Location: [place];  Service: General;  Laterality: N/A;  . COLONOSCOPY    . CORONARY ARTERY BYPASS GRAFT  2004   [place]  . CORONARY STENT INTERVENTION N/A 05/21/2017   Procedure: Coronary Stent Intervention;  Surgeon: [name], MD;  Location: [place] CV LAB;  Service: Cardiovascular;  Laterality: N/A;  . ENDARTERECTOMY Right 12/10/2015   Procedure: Right Carotid ENDARTERECTOMY;  Surgeon: Conrad Fallbrook, MD;  Location: [place];  Service: Vascular;  Laterality: Right;  . EXPLORATION POST OPERATIVE OPEN HEART    . FINGER SURGERY     skin graft on rt index  . FINGER SURGERY Right    right index finger.  [name] HERNIA REPAIR Right    RIH  . JOINT REPLACEMENT    . KNEE SURGERY     replacement on both knees  . LEFT HEART CATH AND CORS/GRAFTS ANGIOGRAPHY N/A 05/21/2017   Procedure: Left Heart Cath and Cors/Grafts Angiography;  Surgeon: [name], MD;  Location: [place] CV LAB;  Service: Cardiovascular;  Laterality: N/A;  . PACEMAKER IMPLANT N/A 03/14/2020   Procedure: PACEMAKER IMPLANT;  Surgeon: Evans Lance, MD;  Location: [place] CV LAB;  Service: Cardiovascular;  Laterality: N/A;  . PROSTATE SURGERY    . PVC ABLATION N/A 10/27/2017   Procedure: PVC ABLATION;  Surgeon: Evans Lance, MD;  Location: [place] CV LAB;  Service: Cardiovascular;  Laterality: N/A;  .  TONSILLECTOMY    . URINARY SURGERY     scar tissue    reports that he quit smoking about 49 years ago. He has a 13.00 pack-year smoking history. He has never used smokeless tobacco. He reports that he does not drink alcohol or use drugs. family history includes Cancer in his sister; Diabetes in his brother and sister; Heart disease in his brother, father, and mother; Hyperlipidemia in his brother and father; Hypertension in his brother, father, and sister; Stroke in his mother. Allergies  Allergen Reactions  . Lisinopril Swelling and Other (See Comments)    Angioedema.   . Losartan Swelling  and Other (See Comments)    Lips swell Angioedema   . Nifedipine Swelling and Other (See Comments)    Sugar increase  . Triamterene Swelling and Other (See Comments)    Face swells, no breathing impairment  . Hydralazine Hcl Other (See Comments)    Fatigue; poor appetite  . Losartan Potassium-Hctz Swelling and Other (See Comments)  . Amoxicillin Rash and Other (See Comments)    Did it involve swelling of the face/tongue/throat, SOB, or low BP? No Did it involve sudden or severe rash/hives, skin peeling, or any reaction on the inside of your mouth or nose? No Did you need to seek medical attention at a hospital or doctor's office? No When did it last happen?10 + years If all above answers are "NO", may proceed with cephalosporin use.    . Ampicillin Rash and Other (See Comments)    Did it involve swelling of the face/tongue/throat, SOB, or low BP? No Did it involve sudden or severe rash/hives, skin peeling, or any reaction on the inside of your mouth or nose? No Did you need to seek medical attention at a hospital or doctor's office? No When did it last happen?10 + years If all above answers are "NO", may proceed with cephalosporin use.   . Carvedilol Other (See Comments)    Low heart rate    Current Outpatient Medications on File Prior to Visit  Medication Sig Dispense Refill  . amiodarone (PACERONE) 200 MG tablet Take 1 tablet (200 mg total) by mouth 2 (two) times daily. 180 tablet 3  . apixaban (ELIQUIS) 2.5 MG TABS tablet Take 1 tablet (2.5 mg total) by mouth 2 (two) times daily. (0800 & 2000) 180 tablet 3  . aspirin EC 81 MG tablet Take 1 tablet (81 mg total) by mouth daily. 90 tablet 3  . atorvastatin (LIPITOR) 40 MG tablet Take 40 mg by mouth at bedtime.     . Calcium-Magnesium-Zinc (CAL-MAG-ZINC PO) Take 1 tablet by mouth every other day.     . Cholecalciferol (VITAMIN D-3) 25 MCG (1000 UT) CAPS Take 1,000 Units by mouth at bedtime.    . hydrALAZINE  (APRESOLINE) 10 MG tablet Take 10 mg by mouth 3 (three) times daily as needed (elevated bp greater than 160).    . magnesium oxide (MAG-OX) 400 MG tablet Take 800 mg by mouth 2 (two) times daily.     . metoprolol tartrate (LOPRESSOR) 25 MG tablet Take 0.5 tablets (12.5 mg total) by mouth 2 (two) times daily. 90 tablet 3  . nitroGLYCERIN (NITROSTAT) 0.4 MG SL tablet Place 1 tablet (0.4 mg total) under the tongue every 5 (five) minutes as needed for chest pain (x 3 doses). 30 tablet 3  . pantoprazole (PROTONIX) 40 MG tablet Take 1 tablet (40 mg total) by mouth daily. 90 tablet 3  . terazosin (HYTRIN) 1 MG capsule  Take 1 mg by mouth at bedtime.  30 capsule 11  . vitamin C (ASCORBIC ACID) 500 MG tablet Take 500 mg by mouth daily with breakfast.     . Zinc 22.5 MG TABS Take 22.5 mg by mouth every other day.     No current facility-administered medications on file prior to visit.   Review of Systems All otherwise neg per pt     Objective:   Physical Exam BP 130/72 (BP Location: Left Arm, Patient Position: Sitting, Cuff Size: Large)   Pulse 60   Temp 97.9 F (36.6 C) (Oral)   Ht 6\' 2"  (1.88 m)   Wt 215 lb (97.5 kg)   SpO2 98%   BMI 27.60 kg/m  VS noted,  Constitutional: Pt appears in NAD HENT: Head: NCAT.  Right Ear: External ear normal.  Left Ear: External ear normal.  Eyes: . Pupils are equal, round, and reactive to light. Conjunctivae and EOM are normal Nose: without d/c or deformity Neck: Neck supple. Gross normal ROM Cardiovascular: Normal rate and regular rhythm.  RRR Pulmonary/Chest: Effort normal and breath sounds without rales or wheezing.  Abd:  Soft, NT, ND, + BS, no organomegaly Neurological: Pt is alert. At baseline orientation, motor grossly intact Skin: Skin is warm. No rashes, other new lesions, no LE edema Psychiatric: Pt behavior is normal without agitation  All otherwise neg per pt Lab Results  Component Value Date   WBC 5.6 04/03/2020   HGB 10.1 (L) 04/03/2020    HCT 31.0 (L) 04/03/2020   PLT 144 (L) 04/03/2020   GLUCOSE 91 04/03/2020   CHOL 114 11/21/2019   TRIG 69 11/21/2019   HDL 38 (L) 11/21/2019   LDLDIRECT 67.0 09/16/2016   LDLCALC 62 11/21/2019   ALT 15 01/25/2020   AST 19 01/25/2020   NA 140 04/03/2020   K 4.9 04/03/2020   CL 103 04/03/2020   CREATININE 2.17 (H) 04/03/2020   BUN 28 (H) 04/03/2020   CO2 28 04/03/2020   TSH 3.885 01/25/2020   PSA 1.09 03/31/2018   INR 1.1 01/02/2020   HGBA1C 6.5 (H) 11/21/2019   MICROALBUR 5.1 (H) 03/31/2018      Assessment & Plan:

## 2020-04-17 ENCOUNTER — Encounter: Payer: Self-pay | Admitting: Internal Medicine

## 2020-04-17 ENCOUNTER — Telehealth: Payer: Self-pay | Admitting: Internal Medicine

## 2020-04-17 NOTE — Assessment & Plan Note (Signed)

## 2020-04-17 NOTE — Assessment & Plan Note (Signed)
stable overall by history and exam, recent data reviewed with pt, and pt to continue medical treatment as before,  to f/u any worsening symptoms or concerns  

## 2020-04-17 NOTE — Telephone Encounter (Signed)
Would hold lopressor this evening and resume tomorrow am.  Would stop amlodipine for the time being.   Please make sure he is hydrating well.   Legrand Como [address] [place], PA-C  04/17/2020 2:22 PM

## 2020-04-17 NOTE — Telephone Encounter (Signed)
Instructed patient to STOP AMLODIPINE. Instructed the patient to HOLD METOPROLOL tonight and resume tomorrow. He understands to stay hydrated. ED precautions reviewed.  He was grateful for assistance.

## 2020-04-17 NOTE — Telephone Encounter (Signed)
New Message  STAT if HR is under 50 or over 120 (normal HR is 60-100 beats per minute)  1) What is your heart rate? 89  2) Do you have a log of your heart rate readings (document readings)? 69, 89, 99  3) Do you have any other symptoms? Blood pressure 88/69 HR 89, States just not feeling good.

## 2020-04-17 NOTE — Telephone Encounter (Signed)
Mr. Loveall is concerned because for the last couple days, his BP has been 80s/60s and HR up in the 90s. Previously, his BP had been running 100-120/60s and HR 60s. He has general feelings of fatigue, tiredness, and he is "headachey." He has follow-up post DCCV Monday in the [facility] but would like recommendations prior to that time.   Will forward to [facility] and [facility] for recommendations.

## 2020-04-22 ENCOUNTER — Ambulatory Visit (HOSPITAL_COMMUNITY)
Admission: RE | Admit: 2020-04-22 | Discharge: 2020-04-22 | Disposition: A | Discharge status: Home / Self Care | Payer: Medicare Other | Source: Ambulatory Visit | Attending: Physician Assistant | Admitting: Physician Assistant

## 2020-04-22 ENCOUNTER — Encounter (HOSPITAL_COMMUNITY): Payer: Self-pay | Admitting: Physician Assistant

## 2020-04-22 ENCOUNTER — Other Ambulatory Visit: Payer: Self-pay

## 2020-04-22 VITALS — BP 124/66 | HR 60 | Ht 74.0 in | Wt 216.8 lb

## 2020-04-22 DIAGNOSIS — Z8249 Family history of ischemic heart disease and other diseases of the circulatory system: Secondary | ICD-10-CM | POA: Insufficient documentation

## 2020-04-22 DIAGNOSIS — Z79899 Other long term (current) drug therapy: Secondary | ICD-10-CM | POA: Diagnosis not present

## 2020-04-22 DIAGNOSIS — I4819 Other persistent atrial fibrillation: Secondary | ICD-10-CM | POA: Insufficient documentation

## 2020-04-22 DIAGNOSIS — Z85828 Personal history of other malignant neoplasm of skin: Secondary | ICD-10-CM | POA: Insufficient documentation

## 2020-04-22 DIAGNOSIS — Z880 Allergy status to penicillin: Secondary | ICD-10-CM | POA: Diagnosis not present

## 2020-04-22 DIAGNOSIS — Z951 Presence of aortocoronary bypass graft: Secondary | ICD-10-CM | POA: Insufficient documentation

## 2020-04-22 DIAGNOSIS — K219 Gastro-esophageal reflux disease without esophagitis: Secondary | ICD-10-CM | POA: Diagnosis not present

## 2020-04-22 DIAGNOSIS — Z833 Family history of diabetes mellitus: Secondary | ICD-10-CM | POA: Insufficient documentation

## 2020-04-22 DIAGNOSIS — E785 Hyperlipidemia, unspecified: Secondary | ICD-10-CM | POA: Diagnosis not present

## 2020-04-22 DIAGNOSIS — Z950 Presence of cardiac pacemaker: Secondary | ICD-10-CM | POA: Insufficient documentation

## 2020-04-22 DIAGNOSIS — D6869 Other thrombophilia: Secondary | ICD-10-CM | POA: Insufficient documentation

## 2020-04-22 DIAGNOSIS — I714 Abdominal aortic aneurysm, without rupture: Secondary | ICD-10-CM | POA: Diagnosis not present

## 2020-04-22 DIAGNOSIS — I129 Hypertensive chronic kidney disease with stage 1 through stage 4 chronic kidney disease, or unspecified chronic kidney disease: Secondary | ICD-10-CM | POA: Diagnosis not present

## 2020-04-22 DIAGNOSIS — I493 Ventricular premature depolarization: Secondary | ICD-10-CM | POA: Insufficient documentation

## 2020-04-22 DIAGNOSIS — Z7982 Long term (current) use of aspirin: Secondary | ICD-10-CM | POA: Diagnosis not present

## 2020-04-22 DIAGNOSIS — E1122 Type 2 diabetes mellitus with diabetic chronic kidney disease: Secondary | ICD-10-CM | POA: Diagnosis not present

## 2020-04-22 DIAGNOSIS — Z87891 Personal history of nicotine dependence: Secondary | ICD-10-CM | POA: Insufficient documentation

## 2020-04-22 DIAGNOSIS — R5383 Other fatigue: Secondary | ICD-10-CM | POA: Diagnosis not present

## 2020-04-22 DIAGNOSIS — Z7901 Long term (current) use of anticoagulants: Secondary | ICD-10-CM | POA: Diagnosis not present

## 2020-04-22 DIAGNOSIS — N184 Chronic kidney disease, stage 4 (severe): Secondary | ICD-10-CM | POA: Diagnosis not present

## 2020-04-22 DIAGNOSIS — Z888 Allergy status to other drugs, medicaments and biological substances status: Secondary | ICD-10-CM | POA: Diagnosis not present

## 2020-04-22 DIAGNOSIS — I2510 Atherosclerotic heart disease of native coronary artery without angina pectoris: Secondary | ICD-10-CM | POA: Diagnosis not present

## 2020-04-22 MED ORDER — AMIODARONE HCL 200 MG PO TABS
200.0000 mg | ORAL_TABLET | Freq: Every day | ORAL | 3 refills | Status: DC
Start: 2020-04-22 — End: 2020-06-10

## 2020-04-22 NOTE — Patient Instructions (Signed)
Decrease Amiodarone to 200mg once a day 

## 2020-04-22 NOTE — Progress Notes (Signed)
Primary Care Physician: [name], MD Primary Cardiologist: Dr Burt Knack Primary Electrophysiologist: Dr Lovena Le Referring Physician: [name] PA   Thomas Marshall is a 79 y.o. male with a history of CAD status post CABG, symptomatic PVCs status post ablation 2018, paroxymal atrial fibrillation on dofetilide, carotid disease status post right carotid endarterectomy, DM, small AAA, hypertension, CKD stage III-IV and HLD who presents for follow up in the [facility]. Patient is on Eliquis for a CHADS2VASC score of 7. Patient seen in follow up by [name] PA for several weeks of increasing dyspnea. Patient was found to be in atrial fibrillation and DCCV was scheduled. Patient presented in [place] and the procedure was cancelled. Since then, patient reports that he has had more SOB and fatigue. He checks his heart rate on his home BP machine which has shown rates in the 70s-120s. Patient reports that he was briefly on amiodarone in 2004 after his CABG and he tolerated the medication well. Patient is s/p DCCV on 02/14/20 which was unsuccessful. Patient underwent DCCV on 02/26/20 which was successful. Unfortunately, he became very bradycardic with pulse rates in the 30s. He was seen urgently by DOD and his rate control and amiodarone were decreased and then he reverted back to afib. He was seen by Dr Lovena Le for tachybradycardia syndrome and he is s/p PPM implantation. He had persistent afib since implant and underwent DCCV on 04/12/20.  On follow up today, patient reports he felt no better after DCCV. He had two appointments with his PCP and cardiology at the [facility] last week and was in [place] at both visits. He continues to be very fatigued with dyspnea. He denies chest pain or heart racing. He denies bleeding issues on anticoagulation. He appears to be back in afib today.   Today, he denies symptoms of palpitations, chest pain, orthopnea, PND, lower extremity edema, presyncope, syncope,  snoring, daytime somnolence, bleeding, or neurologic sequela. The patient is tolerating medications without difficulties and is otherwise without complaint today.    Atrial Fibrillation Risk Factors:  he does not have symptoms or diagnosis of sleep apnea. he does not have a history of rheumatic fever.   he has a BMI of Body mass index is 27.84 kg/m.[name] Filed Weights   04/22/20 1038  Weight: 98.3 kg    Family History  Problem Relation Age of Onset  . Stroke Mother   . Heart disease Mother   . Heart disease Father   . Hypertension Father   . Hyperlipidemia Father   . Cancer Sister        breast  and skin  . Diabetes Sister   . Hypertension Sister   . Diabetes Brother   . Heart disease Brother   . Hyperlipidemia Brother   . Hypertension Brother   . Early death Neg Hx      Atrial Fibrillation Management history:  Previous antiarrhythmic drugs: amiodarone, dofetilide Previous cardioversions: 02/14/20, 04/12/20 Previous ablations: 2018 PVC CHADS2VASC score: 7 Anticoagulation history: Eliquis   Past Medical History:  Diagnosis Date  . AAA (abdominal aortic aneurysm) ([place])   . Arthritis   . Cancer (HCC)    skin & squamous cell  . Carotid artery disease ([place])    a. s/p R CEA 12/2015.  [name] CKD (chronic kidney disease), stage III    a. per historical labs.  . Coronary artery disease    a. s/p CABG 2004.  . Diabetes mellitus without complication ([place])   .  GERD (gastroesophageal reflux disease)   . Hyperlipidemia   . Hypertension   . Medication intolerance Multiple   . PAF (paroxysmal atrial fibrillation) ([place])   . Pneumonia   . Sinus bradycardia    a. baseline HR 50s-60s, also h/o bradycardia on metoprolol and carvedilol   Past Surgical History:  Procedure Laterality Date  . ANKLE SURGERY     right fused  . BACK SURGERY     low back   . CARDIOVERSION N/A 02/14/2020   Procedure: CARDIOVERSION;  Surgeon: Geralynn Rile, MD;  Location: [place];  Service:  Cardiovascular;  Laterality: N/A;  . CARDIOVERSION N/A 02/26/2020   Procedure: CARDIOVERSION;  Surgeon: [name], MD;  Location: [facility] ENDOSCOPY;  Service: Cardiovascular;  Laterality: N/A;  . CARDIOVERSION N/A 04/12/2020   Procedure: CARDIOVERSION;  Surgeon: Skeet Latch, MD;  Location: [facility] ENDOSCOPY;  Service: Cardiovascular;  Laterality: N/A;  . CHOLECYSTECTOMY N/A 12/07/2013   Procedure: LAPAROSCOPIC CHOLECYSTECTOMY ;  Surgeon: [name], MD;  Location: [place];  Service: General;  Laterality: N/A;  . COLONOSCOPY    . CORONARY ARTERY BYPASS GRAFT  2004   [place]  . CORONARY STENT INTERVENTION N/A 05/21/2017   Procedure: Coronary Stent Intervention;  Surgeon: [name], MD;  Location: [place] CV LAB;  Service: Cardiovascular;  Laterality: N/A;  . ENDARTERECTOMY Right 12/10/2015   Procedure: Right Carotid ENDARTERECTOMY;  Surgeon: Conrad Harper, MD;  Location: [place];  Service: Vascular;  Laterality: Right;  . EXPLORATION POST OPERATIVE OPEN HEART    . FINGER SURGERY     skin graft on rt index  . FINGER SURGERY Right    right index finger.  [name] HERNIA REPAIR Right    RIH  . JOINT REPLACEMENT    . KNEE SURGERY     replacement on both knees  . LEFT HEART CATH AND CORS/GRAFTS ANGIOGRAPHY N/A 05/21/2017   Procedure: Left Heart Cath and Cors/Grafts Angiography;  Surgeon: [name], MD;  Location: [place] CV LAB;  Service: Cardiovascular;  Laterality: N/A;  . PACEMAKER IMPLANT N/A 03/14/2020   Procedure: PACEMAKER IMPLANT;  Surgeon: Evans Lance, MD;  Location: [place] CV LAB;  Service: Cardiovascular;  Laterality: N/A;  . PROSTATE SURGERY    . PVC ABLATION N/A 10/27/2017   Procedure: PVC ABLATION;  Surgeon: Evans Lance, MD;  Location: [place] CV LAB;  Service: Cardiovascular;  Laterality: N/A;  . TONSILLECTOMY    . URINARY SURGERY     scar tissue    Current Outpatient Medications  Medication Sig Dispense Refill  . amiodarone (PACERONE) 200  MG tablet Take 1 tablet (200 mg total) by mouth daily. 180 tablet 3  . apixaban (ELIQUIS) 2.5 MG TABS tablet Take 1 tablet (2.5 mg total) by mouth 2 (two) times daily. (0800 & 2000) 180 tablet 3  . aspirin EC 81 MG tablet Take 1 tablet (81 mg total) by mouth daily. 90 tablet 3  . atorvastatin (LIPITOR) 40 MG tablet Take 40 mg by mouth at bedtime.     . Calcium-Magnesium-Zinc (CAL-MAG-ZINC PO) Take 1 tablet by mouth every other day.     . Cholecalciferol (VITAMIN D-3) 25 MCG (1000 UT) CAPS Take 1,000 Units by mouth at bedtime.    [name] glucose blood test strip Accu-Check Guide Strips. Dx E11.9 Use to test blood glucose level 2 times a day. 50 each 11  . hydrALAZINE (APRESOLINE) 10 MG tablet Take 10 mg by mouth 3 (three) times daily as needed (elevated  bp greater than 160).    . Lancets MISC Use as directed twice per day E11.9 200 each 3  . magnesium oxide (MAG-OX) 400 MG tablet Take 800 mg by mouth 2 (two) times daily.     . metoprolol tartrate (LOPRESSOR) 25 MG tablet Take 0.5 tablets (12.5 mg total) by mouth 2 (two) times daily. 90 tablet 3  . nitroGLYCERIN (NITROSTAT) 0.4 MG SL tablet Place 1 tablet (0.4 mg total) under the tongue every 5 (five) minutes as needed for chest pain (x 3 doses). 30 tablet 3  . pantoprazole (PROTONIX) 40 MG tablet Take 1 tablet (40 mg total) by mouth daily. 90 tablet 3  . terazosin (HYTRIN) 1 MG capsule Take 1 mg by mouth at bedtime.  30 capsule 11  . vitamin B-12 (CYANOCOBALAMIN) 1000 MCG tablet Take 1 tablet (1,000 mcg total) by mouth daily. 90 tablet 1  . vitamin C (ASCORBIC ACID) 500 MG tablet Take 500 mg by mouth daily with breakfast.     . Zinc 22.5 MG TABS Take 22.5 mg by mouth every other day.     No current facility-administered medications for this encounter.    Allergies  Allergen Reactions  . Lisinopril Swelling and Other (See Comments)    Angioedema.   . Losartan Swelling and Other (See Comments)    Lips swell Angioedema   . Nifedipine Swelling and  Other (See Comments)    Sugar increase  . Triamterene Swelling and Other (See Comments)    Face swells, no breathing impairment  . Hydralazine Hcl Other (See Comments)    Fatigue; poor appetite  . Losartan Potassium-Hctz Swelling and Other (See Comments)  . Amoxicillin Rash and Other (See Comments)    Did it involve swelling of the face/tongue/throat, SOB, or low BP? No Did it involve sudden or severe rash/hives, skin peeling, or any reaction on the inside of your mouth or nose? No Did you need to seek medical attention at a hospital or doctor's office? No When did it last happen?10 + years If all above answers are "NO", may proceed with cephalosporin use.    . Ampicillin Rash and Other (See Comments)    Did it involve swelling of the face/tongue/throat, SOB, or low BP? No Did it involve sudden or severe rash/hives, skin peeling, or any reaction on the inside of your mouth or nose? No Did you need to seek medical attention at a hospital or doctor's office? No When did it last happen?10 + years If all above answers are "NO", may proceed with cephalosporin use.   . Carvedilol Other (See Comments)    Low heart rate     Social History   Socioeconomic History  . Marital status: Married    Spouse name: Not on file  . Number of children: 2  . Years of education: Not on file  . Highest education level: Not on file  Occupational History  . Occupation: retired  Tobacco Use  . Smoking status: Former Smoker    Packs/day: 1.00    Years: 13.00    Pack years: 13.00    Quit date: 11/30/1970    Years since quitting: 49.4  . Smokeless tobacco: Never Used  Substance and Sexual Activity  . Alcohol use: No    Alcohol/week: 0.0 standard drinks  . Drug use: No  . Sexual activity: Yes  Other Topics Concern  . Not on file  Social History Narrative  . Not on file   Social Determinants of Health  Financial Resource Strain:   . Difficulty of Paying Living Expenses:   Food  Insecurity:   . Worried About Charity fundraiser in the Last Year:   . Arboriculturist in the Last Year:   Transportation Needs:   . Film/video editor (Medical):   [name] Lack of Transportation (Non-Medical):   Physical Activity: Sufficiently Active  . Days of Exercise per Week: 4 days  . Minutes of Exercise per Session: 40 min  Stress:   . Feeling of Stress :   Social Connections:   . Frequency of Communication with Friends and Family:   . Frequency of Social Gatherings with Friends and Family:   . Attends Religious Services:   . Active Member of Clubs or Organizations:   . Attends Archivist Meetings:   [name] Marital Status:   Intimate Partner Violence:   . Fear of Current or Ex-Partner:   . Emotionally Abused:   [name] Physically Abused:   . Sexually Abused:      ROS- All systems are reviewed and negative except as per the HPI above.  Physical Exam: Vitals:   04/22/20 1038  BP: 124/66  Pulse: 60  Weight: 98.3 kg  Height: 6\' 2"  (1.88 m)    GEN- The patient is well appearing elderly male, alert and oriented x 3 today.   HEENT-head normocephalic, atraumatic, sclera clear, conjunctiva pink, hearing intact, trachea midline. Lungs- Clear to ausculation bilaterally, normal work of breathing Heart- Regular rate and rhythm, no murmurs, rubs or gallops  GI- soft, NT, ND, + BS Extremities- no clubbing, cyanosis, or edema MS- no significant deformity or atrophy Skin- no rash or lesion Psych- euthymic mood, full affect Neuro- strength and sensation are intact   Wt Readings from Last 3 Encounters:  04/22/20 98.3 kg  04/15/20 97.5 kg  04/03/20 97.5 kg    EKG today demonstrates V paced rhythm underlying afib, HR 60, QRS 206, QTc 492  Echo 09/29/17 demonstrated  Left ventricle: The cavity size was normal. There was moderate  focal basal hypertrophy of the septum with otherwise moderate  concentric hypertrophy. Systolic function was normal. The  estimated  ejection fraction was in the range of 55% to 60%. Wall  motion was normal; there were no regional wall motion  abnormalities. The study is not technically sufficient to allow  evaluation of LV diastolic function.  - Aortic valve: Trileaflet; severely thickened, severely calcified  leaflets. The non-coronary cusp is most affected. Noncoronary  cusp mobility was severely restricted. Valve area (VTI): 2.39  cm^2. Valve area (Vmax): 2.36 cm^2. Valve area (Vmean): 1.85  cm^2.  - Mitral valve: Moderately calcified annulus. Transvalvular  velocity was within the normal range. There was no evidence for  stenosis. There was mild regurgitation.  - Left atrium: The atrium was moderately dilated.  - Right ventricle: The cavity size was normal. Wall thickness was  normal. Systolic function was normal.  - Atrial septum: No defect or patent foramen ovale was identified  by color flow Doppler.  - Tricuspid valve: There was trivial regurgitation.  - Pulmonary arteries: Systolic pressure was mildly increased. PA  peak pressure: 41 mm Hg (S).   Epic records are reviewed at length today  CHA2DS2-VASc Score = 7 The patient's score is based upon: CHF History: No HTN History: Yes Age : 20 + Diabetes History: Yes Stroke History: Yes Vascular Disease History: Yes Gender: Male   ASSESSMENT AND PLAN: 1. Persistent Atrial Fibrillation (ICD10:  I48.0) The patient's  CHA2DS2-VASc score is 7, indicating a 11.2% annual risk of stroke. S/p DCCV on 04/12/20 Patient had no symptomatic improvement despite SR. Decrease amiodarone to 200 mg daily Continue Eliquis 2.5 mg BID.  Continue Lopressor 12.5 mg BID Will d/w Dr Lovena Le regarding atrial therapies on his device.   2. Secondary Hypercoagulable State (ICD10:  D68.69) The patient is at significant risk for stroke/thromboembolism based upon his CHA2DS2-VASc Score of 7.  Continue Apixaban (Eliquis).   3. PVC S/p ablation 2018.  4.  CAD S/p CABG, DES to SVG in 2018. See plans below.  5. HTN Stable, no changes today  6. Fatigue/dyspnea Patient and wife concerned given the similarities between these symptoms and the symptoms he had prior to his CABG and PCI. Patient reports he has "no faith" in stress tests as he had a normal test prior to his CABG.  He has an echo scheduled at the [facility] for tomorrow.  Will d/w Dr Burt Knack. ? LHC    Follow up with Dr Burt Knack and Dr Lovena Le as scheduled pending discussion with Dr Burt Knack.   [facility] [address] [place], [place] [address] [phone] 04/22/2020 11:41 AM

## 2020-04-24 ENCOUNTER — Telehealth: Payer: Self-pay

## 2020-04-24 ENCOUNTER — Other Ambulatory Visit: Payer: Self-pay

## 2020-04-24 ENCOUNTER — Encounter: Payer: Self-pay | Admitting: Cardiovascular Disease

## 2020-04-24 ENCOUNTER — Ambulatory Visit (INDEPENDENT_AMBULATORY_CARE_PROVIDER_SITE_OTHER): Payer: Medicare Other | Admitting: Cardiovascular Disease

## 2020-04-24 ENCOUNTER — Telehealth: Payer: Self-pay | Admitting: Cardiovascular Disease

## 2020-04-24 VITALS — BP 126/64 | HR 60 | Ht 74.0 in | Wt 217.0 lb

## 2020-04-24 DIAGNOSIS — I10 Essential (primary) hypertension: Secondary | ICD-10-CM | POA: Diagnosis not present

## 2020-04-24 DIAGNOSIS — I25119 Atherosclerotic heart disease of native coronary artery with unspecified angina pectoris: Secondary | ICD-10-CM

## 2020-04-24 DIAGNOSIS — E782 Mixed hyperlipidemia: Secondary | ICD-10-CM | POA: Diagnosis not present

## 2020-04-24 DIAGNOSIS — I4819 Other persistent atrial fibrillation: Secondary | ICD-10-CM | POA: Diagnosis not present

## 2020-04-24 DIAGNOSIS — I6523 Occlusion and stenosis of bilateral carotid arteries: Secondary | ICD-10-CM | POA: Diagnosis not present

## 2020-04-24 DIAGNOSIS — N184 Chronic kidney disease, stage 4 (severe): Secondary | ICD-10-CM

## 2020-04-24 LAB — CBC WITH DIFFERENTIAL/PLATELET
Basophils Absolute: 0.1 10*3/uL (ref 0.0–0.2)
Basos: 1 %
EOS (ABSOLUTE): 0.5 10*3/uL — ABNORMAL HIGH (ref 0.0–0.4)
Eos: 9 %
Hematocrit: 31.9 % — ABNORMAL LOW (ref 37.5–51.0)
Hemoglobin: 10.3 g/dL — ABNORMAL LOW (ref 13.0–17.7)
Lymphocytes Absolute: 1 10*3/uL (ref 0.7–3.1)
Lymphs: 16 %
MCH: 28.8 pg (ref 26.6–33.0)
MCHC: 32.3 g/dL (ref 31.5–35.7)
MCV: 89 fL (ref 79–97)
Monocytes Absolute: 0.5 10*3/uL (ref 0.1–0.9)
Monocytes: 9 %
Neutrophils Absolute: 4 10*3/uL (ref 1.4–7.0)
Neutrophils: 65 %
Platelets: 135 10*3/uL — ABNORMAL LOW (ref 150–450)
RBC: 3.58 x10E6/uL — ABNORMAL LOW (ref 4.14–5.80)
RDW: 15.6 % — ABNORMAL HIGH (ref 11.6–15.4)
WBC: 6.1 10*3/uL (ref 3.4–10.8)

## 2020-04-24 LAB — BASIC METABOLIC PANEL
BUN/Creatinine Ratio: 15 (ref 10–24)
BUN: 33 mg/dL — ABNORMAL HIGH (ref 8–27)
CO2: 30 mmol/L — ABNORMAL HIGH (ref 20–29)
Calcium: 8.7 mg/dL (ref 8.6–10.2)
Chloride: 104 mmol/L (ref 96–106)
Creatinine, Ser: 2.14 mg/dL — ABNORMAL HIGH (ref 0.76–1.27)
GFR calc Af Amer: 33 mL/min/{1.73_m2} — ABNORMAL LOW (ref >59)
GFR calc non Af Amer: 28 mL/min/{1.73_m2} — ABNORMAL LOW (ref >59)
Glucose: 168 mg/dL — ABNORMAL HIGH (ref 65–99)
Potassium: 4.7 mmol/L (ref 3.5–5.2)
Sodium: 140 mmol/L (ref 134–144)

## 2020-04-24 LAB — MAGNESIUM: Magnesium: 2.3 mg/dL (ref 1.6–2.3)

## 2020-04-24 NOTE — Telephone Encounter (Signed)
Medical records requested from [facility]. 04/24/20 vlm

## 2020-04-24 NOTE — Progress Notes (Signed)
Cardiology Office Note:    Date:  04/25/2020   ID:  Thomas Marshall, DOB 1941/02/11, MRN [id number]  PCP:  [name], MD  Cardiologist:  [name], MD  Electrophysiologist:  Cristopher Peru, MD   Referring MD: [name], MD   Chief Complaint  Patient presents with   Coronary Artery Disease    History of Present Illness:    Thomas Marshall is a 79 y.o. male with a hx of CAD status post CABG, symptomatic PVCs status post ablation 2018, paroxymal atrial fibrillation previously on dofetilide, carotid disease status post right carotid endarterectomy, DM, small AAA, hypertension, CKD stage III-IV and HLD who presents for follow up evaluation. Pt is s/p multiple cardioversion procedures, PPM placement, and continues on low dose amiodarone for rhythm control. His most recent cardioversion was performed 04/12/2020.   The patient is here with his wife today.  He continues to struggle with fatigue, shortness of breath with activity, gait instability, and dizziness.  He really has not felt any better since undergoing permanent pacemaker placement.  He underwent cardioversion May 14 and did not appreciate any significant improvement with sinus rhythm.  I am not sure how long he stayed in rhythm, but today's EKG shows ventricular pacing and probably underlying atrial fibrillation.  We are going to have the device clinic evaluate his rhythm today and see how long he maintained sinus after cardioversion.  He remains on amiodarone 100 mg daily and continues on apixaban.  The patient's wife feels like his symptoms are identical to those he experienced when he had a severe vein graft stenosis in 2018.  He improved after stenting of that vein graft.  He has slowly developed worsening symptoms as outlined.  He comes in in a wheelchair today because he is able to walk long distances.  He has not had chest pain or pressure, orthopnea, PND, or leg swelling.  He has not experienced frank syncope or recent  palpitations.  Past Medical History:  Diagnosis Date   AAA (abdominal aortic aneurysm) ([place])    Arthritis    Cancer ([place])    skin & squamous cell   Carotid artery disease ([place])    a. s/p R CEA 12/2015.   CKD (chronic kidney disease), stage III    a. per historical labs.   Coronary artery disease    a. s/p CABG 2004.   Diabetes mellitus without complication (HCC)    GERD (gastroesophageal reflux disease)    Hyperlipidemia    Hypertension    Medication intolerance Multiple    PAF (paroxysmal atrial fibrillation) (HCC)    Pneumonia    Sinus bradycardia    a. baseline HR 50s-60s, also h/o bradycardia on metoprolol and carvedilol    Past Surgical History:  Procedure Laterality Date   ANKLE SURGERY     right fused   BACK SURGERY     low back    CARDIOVERSION N/A 02/14/2020   Procedure: CARDIOVERSION;  Surgeon: Geralynn Rile, MD;  Location: [place];  Service: Cardiovascular;  Laterality: N/A;   CARDIOVERSION N/A 02/26/2020   Procedure: CARDIOVERSION;  Surgeon: [name], MD;  Location: [facility] ENDOSCOPY;  Service: Cardiovascular;  Laterality: N/A;   CARDIOVERSION N/A 04/12/2020   Procedure: CARDIOVERSION;  Surgeon: Skeet Latch, MD;  Location: [facility] ENDOSCOPY;  Service: Cardiovascular;  Laterality: N/A;   CHOLECYSTECTOMY N/A 12/07/2013   Procedure: LAPAROSCOPIC CHOLECYSTECTOMY ;  Surgeon: [name], MD;  Location: [place];  Service: General;  Laterality: N/A;  COLONOSCOPY     CORONARY ARTERY BYPASS GRAFT  2004   [place]   CORONARY STENT INTERVENTION N/A 05/21/2017   Procedure: Coronary Stent Intervention;  Surgeon: [name], MD;  Location: [place] CV LAB;  Service: Cardiovascular;  Laterality: N/A;   ENDARTERECTOMY Right 12/10/2015   Procedure: Right Carotid ENDARTERECTOMY;  Surgeon: Conrad [place], MD;  Location: [place];  Service: Vascular;  Laterality: Right;   EXPLORATION POST OPERATIVE OPEN HEART     FINGER SURGERY      skin graft on rt index   FINGER SURGERY Right    right index finger.   HERNIA REPAIR Right    RIH   JOINT REPLACEMENT     KNEE SURGERY     replacement on both knees   LEFT HEART CATH AND CORS/GRAFTS ANGIOGRAPHY N/A 05/21/2017   Procedure: Left Heart Cath and Cors/Grafts Angiography;  Surgeon: [name], MD;  Location: [place] CV LAB;  Service: Cardiovascular;  Laterality: N/A;   PACEMAKER IMPLANT N/A 03/14/2020   Procedure: PACEMAKER IMPLANT;  Surgeon: Evans Lance, MD;  Location: [place] CV LAB;  Service: Cardiovascular;  Laterality: N/A;   PROSTATE SURGERY     PVC ABLATION N/A 10/27/2017   Procedure: PVC ABLATION;  Surgeon: Evans Lance, MD;  Location: [place] CV LAB;  Service: Cardiovascular;  Laterality: N/A;   TONSILLECTOMY     URINARY SURGERY     scar tissue    Current Medications: Current Meds  Medication Sig   amiodarone (PACERONE) 200 MG tablet Take 1 tablet (200 mg total) by mouth daily. (Patient taking differently: Take 100 mg by mouth daily. )   apixaban (ELIQUIS) 2.5 MG TABS tablet Take 1 tablet (2.5 mg total) by mouth 2 (two) times daily. (0800 & 2000)   aspirin EC 81 MG tablet Take 1 tablet (81 mg total) by mouth daily.   atorvastatin (LIPITOR) 40 MG tablet Take 40 mg by mouth at bedtime.    Calcium-Magnesium-Zinc (CAL-MAG-ZINC PO) Take 1 tablet by mouth every other day.    Cholecalciferol (VITAMIN D-3) 25 MCG (1000 UT) CAPS Take 1,000 Units by mouth at bedtime.   glucose blood test strip Accu-Check Guide Strips. Dx E11.9 Use to test blood glucose level 2 times a day.   hydrALAZINE (APRESOLINE) 10 MG tablet Take 10 mg by mouth 3 (three) times daily as needed (elevated bp greater than 160).   Lancets MISC Use as directed twice per day E11.9   magnesium oxide (MAG-OX) 400 MG tablet Take 800 mg by mouth 2 (two) times daily.    nitroGLYCERIN (NITROSTAT) 0.4 MG SL tablet Place 1 tablet (0.4 mg total) under the tongue every 5  (five) minutes as needed for chest pain (x 3 doses).   pantoprazole (PROTONIX) 40 MG tablet Take 1 tablet (40 mg total) by mouth daily.   terazosin (HYTRIN) 1 MG capsule Take 1 mg by mouth at bedtime.    vitamin C (ASCORBIC ACID) 500 MG tablet Take 500 mg by mouth daily with breakfast.    Zinc 22.5 MG TABS Take 22.5 mg by mouth every other day.   [DISCONTINUED] metoprolol tartrate (LOPRESSOR) 25 MG tablet Take 0.5 tablets (12.5 mg total) by mouth 2 (two) times daily.     Allergies:   Lisinopril, Losartan, Nifedipine, Triamterene, Hydralazine hcl, Losartan potassium-hctz, Amoxicillin, Ampicillin, and Carvedilol   Social History   Socioeconomic History   Marital status: Married    Spouse name: Not on file   Number of  children: 2   Years of education: Not on file   Highest education level: Not on file  Occupational History   Occupation: retired  Tobacco Use   Smoking status: Former Smoker    Packs/day: 1.00    Years: 13.00    Pack years: 13.00    Quit date: 11/30/1970    Years since quitting: 49.4   Smokeless tobacco: Never Used  Substance and Sexual Activity   Alcohol use: No    Alcohol/week: 0.0 standard drinks   Drug use: No   Sexual activity: Yes  Other Topics Concern   Not on file  Social History Narrative   Not on file   Social Determinants of Health   Financial Resource Strain:    Difficulty of Paying Living Expenses:   Food Insecurity:    Worried About Charity fundraiser in the Last Year:    Arboriculturist in the Last Year:   Transportation Needs:    Film/video editor (Medical):    Lack of Transportation (Non-Medical):   Physical Activity: Sufficiently Active   Days of Exercise per Week: 4 days   Minutes of Exercise per Session: 40 min  Stress:    Feeling of Stress :   Social Connections:    Frequency of Communication with Friends and Family:    Frequency of Social Gatherings with Friends and Family:    Attends Religious  Services:    Active Member of Clubs or Organizations:    Attends Archivist Meetings:    Marital Status:      Family History: The patient's family history includes Cancer in his sister; Diabetes in his brother and sister; Heart disease in his brother, father, and mother; Hyperlipidemia in his brother and father; Hypertension in his brother, father, and sister; Stroke in his mother. There is no history of Early death.  ROS:   Please see the history of present illness.    All other systems reviewed and are negative.  EKGs/Labs/Other Studies Reviewed:    The following studies were reviewed today: Cardiac Cath 05-21-2017: Conclusion  1. Severe native coronary artery disease with total occlusion of the left main and total occlusion of the RCA 2. Status post aortocoronary bypass surgery with continued patency of the LIMA to LAD and RIMA to RCA, patency of the saphenous vein graft to OM1 with a moderate eccentric lesion just distal to the graft insertion site, and patency of the saphenous vein graft to ramus intermedius with a critical lesion in the proximal body the graft treated successfully with PCI using distal embolic protection and a 4.0 mm drug-eluting stent  Recommendations: The patient will be hydrated for 6 hours. He will be discharged this evening with follow-up labs next week. Recommend aspirin, Plavix, and Eliquis 1 month, then discontinue aspirin. He should start back on Eliquis tomorrow evening.   Coronary Findings  Diagnostic Dominance: Right Left Main  Ost LM to LM lesion 100% stenosed  The left main is critically stenosed with minimal flow beyond the area of severe stenosis.  Right Coronary Artery  Mid RCA to Dist RCA lesion 100% stenosed  .  RIMA RIMA Graft To RPDA  RIMA graft was visualized by non-selective angiography. The RIMA to PDA graft is patent. The right subclavian artery is very tortuous and will not allow for a catheter to be advanced to the  area of the [place]. The vessel was imaged nonselectively and is demonstrated to be patent. The distal anastomosis is poorly visualized  but there is TIMI 3 flow.  Saphenous Graft To Ramus  SVG. The SVG to ramus intermedius has a critical degenerated 95% eccentric lesion with TIMI 2 flow beyond the lesion. The lesion is located in the proximal body of the graft.  Prox Graft lesion 95% stenosed  The lesion is eccentric. An AL-1 guide catheter is used. Angiomax was used for coagulation. The right femoral sheath was upsized to a 6 [place]. A therapeutic ACT is achieved. The lesion is crossed with cougar wire. Verapamil was administered through the guide once the bypass graft is selectively engaged. The lesion is predilated with a 2.5 mm balloon. Verapamil was administered again. A 5 mm spider distal embolic protection device was prepped and advanced beyond the lesion. The lesion is then stented with a 4.0 x 16 mm Promus DES deployed at 14 atm. The spider embolic protection device is removed and final angiography confirmed 0% residual stenosis and TIMI-3 flow.  Saphenous Graft To 1st Mrg  SVG. The SVG to OM is patent with diffuse irregularity. The OM branch where the vessel is tented up has an unusual appearance just beyond the distal anastomosis. There is at least 50-70% stenosis in that region.  LIMA LIMA Graft To Mid LAD  LIMA. The LIMA to mid LAD is widely patent with no stenosis. The LAD beyond the LIMA insertion site is patent.  Intervention  Prox Graft lesion (Saphenous Graft To Ramus)  Angioplasty  Lesion crossed with guidewire. Pre-stent angioplasty was performed. A STENT PROMUS PREM MR [id number] drug eluting stent was successfully placed. Post-stent angioplasty was not performed. The pre-interventional distal flow is decreased (TIMI 2). The post-interventional distal flow is normal (TIMI 3). No complications occurred at this lesion.  There is a 0% residual stenosis post intervention.  Coronary  Diagrams  Diagnostic Dominance: Right  Intervention    Carotid US 02-07-2020: Summary:  Right Carotid: Patent right carotid endarterectomy site with no evidence  for         restenosis. Velocities in the right ICA are consistent with  a         1-39% stenosis.   Left Carotid: Velocities in the left ICA are consistent with a 60-79%  stenosis.        Calcified plaque prohibits adequate visualization. The ECA  appears        >50% stenosed.   Vertebrals: Bilateral vertebral arteries demonstrate antegrade flow.  Subclavians: Normal flow hemodynamics were seen in bilateral subclavian        arteries.   EKG:  EKG is ordered today.  The ekg ordered today demonstrates ventricular pacing 60 bpm, probable atrial fibrillation underlying rhythm  Recent Labs: 01/25/2020: ALT 15; TSH 3.885 04/24/2020: BUN 33; Creatinine, Ser 2.14; Hemoglobin 10.3; Magnesium 2.3; Platelets 135; Potassium 4.7; Sodium 140  Recent Lipid Panel    Component Value Date/Time   CHOL 114 11/21/2019 0610   TRIG 69 11/21/2019 0610   HDL 38 (L) 11/21/2019 0610   CHOLHDL 3.0 11/21/2019 0610   VLDL 14 11/21/2019 0610   LDLCALC 62 11/21/2019 0610   LDLDIRECT 67.0 09/16/2016 1455    Physical Exam:    VS:  BP 126/64    Pulse 60    Ht '6\' 2"'$  (1.88 m)    Wt 217 lb (98.4 kg)    BMI 27.86 kg/m     Wt Readings from Last 3 Encounters:  04/24/20 217 lb (98.4 kg)  04/22/20 216 lb 12.8 oz (98.3 kg)  04/15/20 215 lb (97.5 kg)  GEN:  Well nourished, well developed elderly male in no acute distress HEENT: Normal NECK: No JVD; No carotid bruits LYMPHATICS: No lymphadenopathy CARDIAC: RRR, no murmurs, rubs, gallops RESPIRATORY:  Clear to auscultation without rales, wheezing or rhonchi  ABDOMEN: Soft, non-tender, non-distended MUSCULOSKELETAL:  No edema; No deformity  SKIN: Warm and dry NEUROLOGIC:  Alert and oriented x 3 PSYCHIATRIC:  Normal affect   ASSESSMENT:    1.  Coronary artery disease involving native coronary artery of native heart with angina pectoris (HCC)   2. Persistent atrial fibrillation ([place])   3. Mixed hyperlipidemia   4. Essential hypertension   5. CKD (chronic kidney disease) stage 4, GFR 15-29 ml/min (HCC)    PLAN:    In order of problems listed above:  1. The patient's symptoms of exertional dyspnea and fatigue may be his anginal equivalent.  He has had similar symptoms in the past associated with severe vein graft stenosis, improved after PCI.  He really has not appreciated any significant benefit from cardioversion or permanent pacemaker placement.  The patient had an echocardiogram performed yesterday at the [facility] medical center and we will do our best to obtain the results of the study.  We discussed considerations around cardiac catheterization and possible PCI.  I reviewed his last heart catheterization films from 2018.  I explained that the potential benefit of cardiac catheterization and PCI have to be weighed against the risks as he now has progressive chronic kidney disease.  After thorough discussion, they would like to proceed.  Will use our hydration protocol and ask him to come in for 4 hours of IV fluids prior to the procedure.  Will limit contrast as much as possible.  If he requires PCI, this may have to be done in staged fashion depending on the amount of contrast used.  In reviewing his last cardiac catheterization films, the RIMA graft could not be selectively engaged from femoral access.  May have to do dual access with right femoral and right radial to evaluate all of his graft anatomy. I have reviewed the risks, indications, and alternatives to cardiac catheterization, possible angioplasty, and stenting with the patient. Risks include but are not limited to bleeding, infection, vascular injury, stroke, myocardial infection, arrhythmia, kidney injury, radiation-related injury in the case of prolonged fluoroscopy use, emergency  cardiac surgery, and death. The patient understands the risks of serious complication is 1-2 in 1694 with diagnostic cardiac cath and 1-2% or less with angioplasty/stenting. Will wait until he is 4 weeks out from cardioversion before interrupting anticoagulation for cardiac cath. 2. Will have his device interrogated.  He continues on apixaban for anticoagulation.  We will stop metoprolol due to relative hypotension.  Will discuss with [name] whether he should remain on amiodarone depending on the findings of his device interrogation. 3. Continue atorvastatin 4. Blood pressures have been low.  I reviewed his home readings.  He has multiple readings in the low 100s and also a few below 90.  I asked him to stop metoprolol. 5. Reviewed medication list.  Not on any nephrotoxic drugs.  Fluid hydration protocol for cardiac catheterization as outlined.  Stop metoprolol to allow for higher perfusion pressure.   Medication Adjustments/Labs and Tests Ordered: Current medicines are reviewed at length with the patient today.  Concerns regarding medicines are outlined above.  Orders Placed This Encounter  Procedures   Basic metabolic panel   CBC with Differential/Platelet   Magnesium   Basic metabolic panel   CBC with Differential/Platelet  EKG 12-Lead   No orders of the defined types were placed in this encounter.   Patient Instructions  Medication Instructions:  1) STOP METOPROLOL *If you need a refill on your cardiac medications before your next appointment, please call your pharmacy*  Testing/Procedures: Your physician has requested that you have a cardiac catheterization. Cardiac catheterization is used to diagnose and/or treat various heart conditions. Doctors may recommend this procedure for a number of different reasons. The most common reason is to evaluate chest pain. Chest pain can be a symptom of coronary artery disease (CAD), and cardiac catheterization can show whether plaque is  narrowing or blocking your hearts arteries. This procedure is also used to evaluate the valves, as well as measure the blood flow and oxygen levels in different parts of your heart. For further information please visit [email or web address]. Please follow instruction sheet, as given.   COVID SCREENING AND LAB INFORMATION (6/18): You will need to have labs drawn on 6/18 at Dr. Antionette Char office. You do not need to be fasting. Please come to get your labs drawn BEFORE your COVID test. The lab opens at 7:30AM.  You are scheduled for your drive-thru COVID screening on: 05/17/2020 at 11:00AM. [place] [facility] ([facility]) [address] Stay in the RIGHT lane and proceed under the brick awning and tell them you are there for pre-procedure testing. Do NOT bring any pets with you to the testing site. You will need to go home after your screening and quarantine until your procedure.   CATHETERIZATION INSTRUCTIONS (6/21): You are scheduled for a Cardiac Catheterization on: 05/20/2020 with Dr. Burt Knack   1. Please arrive at the [facility] (Main Entrance A) at [facility]: [address] [place], [place] [address] at: 7:00AM (This time is two hours before your procedure to ensure your preparation). Free valet parking service is available. You are allowed ONE visitor in the waiting room during your procedure. Both you and your guest must wear masks. Special note: Every effort is made to have your procedure done on time. Please understand that emergencies sometimes delay scheduled procedures.  2. Diet: Do not eat solid foods after midnight.  You may have clear liquids until 5am upon the day of the procedure.  3. Labs: TODAY! BMET, CBC, Mag  4. Medication instructions in preparation for your procedure:  1) HOLD ELIQUIS 2 days prior to your procedure (Take last dose 6/18 in the PM)  2) HOLD HYDRALAZINE the morning of your procedure  3) MAKE SURE TO TAKE TOUR ASPIRIN the  morning of your cath  4) You may take your other meds as directed with sips of water   5. Plan for one night stay--bring personal belongings. 6. Bring a current list of your medications and current insurance cards. 7. You MUST have a responsible person to drive you home. 8. Someone MUST be with you the first 24 hours after you arrive home or your discharge will be delayed. 9. Please wear clothes that are easy to get on and off and wear slip-on shoes.  Thank you for allowing [place] to care for you!   -- [facility] Invasive Cardiovascular services    Signed, [name], MD  04/25/2020 7:40 AM    [place]

## 2020-04-24 NOTE — H&P (View-Only) (Signed)
Cardiology Office Note:    Date:  04/25/2020   ID:  Thomas Marshall, DOB [birth year], MRN [id number]  PCP:  [name], MD  Cardiologist:  [name], MD  Electrophysiologist:  Cristopher Peru, MD   Referring MD: [name], MD   Chief Complaint  Patient presents with  . Coronary Artery Disease    History of Present Illness:    Thomas Marshall is a 79 y.o. male with a hx of CAD status post CABG, symptomatic PVCs status post ablation 2018, paroxymal atrial fibrillation previously on dofetilide, carotid disease status post right carotid endarterectomy, DM, small AAA, hypertension, CKD stage III-IV and HLD who presents for follow up evaluation. Pt is s/p multiple cardioversion procedures, PPM placement, and continues on low dose amiodarone for rhythm control. His most recent cardioversion was performed 04/12/2020.   The patient is here with his wife today.  He continues to struggle with fatigue, shortness of breath with activity, gait instability, and dizziness.  He really has not felt any better since undergoing permanent pacemaker placement.  He underwent cardioversion May 14 and did not appreciate any significant improvement with sinus rhythm.  I am not sure how long he stayed in rhythm, but today's EKG shows ventricular pacing and probably underlying atrial fibrillation.  We are going to have the device clinic evaluate his rhythm today and see how long he maintained sinus after cardioversion.  He remains on amiodarone 100 mg daily and continues on apixaban.  The patient's wife feels like his symptoms are identical to those he experienced when he had a severe vein graft stenosis in 2018.  He improved after stenting of that vein graft.  He has slowly developed worsening symptoms as outlined.  He comes in in a wheelchair today because he is able to walk long distances.  He has not had chest pain or pressure, orthopnea, PND, or leg swelling.  He has not experienced frank syncope or recent  palpitations.  Past Medical History:  Diagnosis Date  . AAA (abdominal aortic aneurysm) ([place])   . Arthritis   . Cancer (HCC)    skin & squamous cell  . Carotid artery disease ([place])    a. s/p R CEA 12/2015.  [name] CKD (chronic kidney disease), stage III    a. per historical labs.  . Coronary artery disease    a. s/p CABG 2004.  . Diabetes mellitus without complication ([place])   . GERD (gastroesophageal reflux disease)   . Hyperlipidemia   . Hypertension   . Medication intolerance Multiple   . PAF (paroxysmal atrial fibrillation) ([place])   . Pneumonia   . Sinus bradycardia    a. baseline HR 50s-60s, also h/o bradycardia on metoprolol and carvedilol    Past Surgical History:  Procedure Laterality Date  . ANKLE SURGERY     right fused  . BACK SURGERY     low back   . CARDIOVERSION N/A 02/14/2020   Procedure: CARDIOVERSION;  Surgeon: Geralynn Rile, MD;  Location: [place];  Service: Cardiovascular;  Laterality: N/A;  . CARDIOVERSION N/A 02/26/2020   Procedure: CARDIOVERSION;  Surgeon: [name], MD;  Location: [facility] ENDOSCOPY;  Service: Cardiovascular;  Laterality: N/A;  . CARDIOVERSION N/A 04/12/2020   Procedure: CARDIOVERSION;  Surgeon: Skeet Latch, MD;  Location: [facility] ENDOSCOPY;  Service: Cardiovascular;  Laterality: N/A;  . CHOLECYSTECTOMY N/A 12/07/2013   Procedure: LAPAROSCOPIC CHOLECYSTECTOMY ;  Surgeon: [name], MD;  Location: [place];  Service: General;  Laterality: N/A;  .  COLONOSCOPY    . CORONARY ARTERY BYPASS GRAFT  2004   [place]  . CORONARY STENT INTERVENTION N/A 05/21/2017   Procedure: Coronary Stent Intervention;  Surgeon: [name], MD;  Location: [place] CV LAB;  Service: Cardiovascular;  Laterality: N/A;  . ENDARTERECTOMY Right 12/10/2015   Procedure: Right Carotid ENDARTERECTOMY;  Surgeon: Conrad Hillsview, MD;  Location: [place];  Service: Vascular;  Laterality: Right;  . EXPLORATION POST OPERATIVE OPEN HEART    . FINGER SURGERY      skin graft on rt index  . FINGER SURGERY Right    right index finger.  [name] HERNIA REPAIR Right    RIH  . JOINT REPLACEMENT    . KNEE SURGERY     replacement on both knees  . LEFT HEART CATH AND CORS/GRAFTS ANGIOGRAPHY N/A 05/21/2017   Procedure: Left Heart Cath and Cors/Grafts Angiography;  Surgeon: [name], MD;  Location: [place] CV LAB;  Service: Cardiovascular;  Laterality: N/A;  . PACEMAKER IMPLANT N/A 03/14/2020   Procedure: PACEMAKER IMPLANT;  Surgeon: Evans Lance, MD;  Location: [place] CV LAB;  Service: Cardiovascular;  Laterality: N/A;  . PROSTATE SURGERY    . PVC ABLATION N/A 10/27/2017   Procedure: PVC ABLATION;  Surgeon: Evans Lance, MD;  Location: [place] CV LAB;  Service: Cardiovascular;  Laterality: N/A;  . TONSILLECTOMY    . URINARY SURGERY     scar tissue    Current Medications: Current Meds  Medication Sig  . amiodarone (PACERONE) 200 MG tablet Take 1 tablet (200 mg total) by mouth daily. (Patient taking differently: Take 100 mg by mouth daily. )  . apixaban (ELIQUIS) 2.5 MG TABS tablet Take 1 tablet (2.5 mg total) by mouth 2 (two) times daily. (0800 & 2000)  . aspirin EC 81 MG tablet Take 1 tablet (81 mg total) by mouth daily.  [name] atorvastatin (LIPITOR) 40 MG tablet Take 40 mg by mouth at bedtime.   . Calcium-Magnesium-Zinc (CAL-MAG-ZINC PO) Take 1 tablet by mouth every other day.   . Cholecalciferol (VITAMIN D-3) 25 MCG (1000 UT) CAPS Take 1,000 Units by mouth at bedtime.  [name] glucose blood test strip Accu-Check Guide Strips. Dx E11.9 Use to test blood glucose level 2 times a day.  . hydrALAZINE (APRESOLINE) 10 MG tablet Take 10 mg by mouth 3 (three) times daily as needed (elevated bp greater than 160).  . Lancets MISC Use as directed twice per day E11.9  . magnesium oxide (MAG-OX) 400 MG tablet Take 800 mg by mouth 2 (two) times daily.   . nitroGLYCERIN (NITROSTAT) 0.4 MG SL tablet Place 1 tablet (0.4 mg total) under the tongue every 5  (five) minutes as needed for chest pain (x 3 doses).  . pantoprazole (PROTONIX) 40 MG tablet Take 1 tablet (40 mg total) by mouth daily.  [name] terazosin (HYTRIN) 1 MG capsule Take 1 mg by mouth at bedtime.   . vitamin C (ASCORBIC ACID) 500 MG tablet Take 500 mg by mouth daily with breakfast.   . Zinc 22.5 MG TABS Take 22.5 mg by mouth every other day.  . [DISCONTINUED] metoprolol tartrate (LOPRESSOR) 25 MG tablet Take 0.5 tablets (12.5 mg total) by mouth 2 (two) times daily.     Allergies:   Lisinopril, Losartan, Nifedipine, Triamterene, Hydralazine hcl, Losartan potassium-hctz, Amoxicillin, Ampicillin, and Carvedilol   Social History   Socioeconomic History  . Marital status: Married    Spouse name: Not on file  . Number of  children: 2  . Years of education: Not on file  . Highest education level: Not on file  Occupational History  . Occupation: retired  Tobacco Use  . Smoking status: Former Smoker    Packs/day: 1.00    Years: 13.00    Pack years: 13.00    Quit date: 11/30/1970    Years since quitting: 49.4  . Smokeless tobacco: Never Used  Substance and Sexual Activity  . Alcohol use: No    Alcohol/week: 0.0 standard drinks  . Drug use: No  . Sexual activity: Yes  Other Topics Concern  . Not on file  Social History Narrative  . Not on file   Social Determinants of Health   Financial Resource Strain:   . Difficulty of Paying Living Expenses:   Food Insecurity:   . Worried About Charity fundraiser in the Last Year:   . Arboriculturist in the Last Year:   Transportation Needs:   . Film/video editor (Medical):   [name] Lack of Transportation (Non-Medical):   Physical Activity: Sufficiently Active  . Days of Exercise per Week: 4 days  . Minutes of Exercise per Session: 40 min  Stress:   . Feeling of Stress :   Social Connections:   . Frequency of Communication with Friends and Family:   . Frequency of Social Gatherings with Friends and Family:   . Attends Religious  Services:   . Active Member of Clubs or Organizations:   . Attends Archivist Meetings:   [name] Marital Status:      Family History: The patient's family history includes Cancer in his sister; Diabetes in his brother and sister; Heart disease in his brother, father, and mother; Hyperlipidemia in his brother and father; Hypertension in his brother, father, and sister; Stroke in his mother. There is no history of Early death.  ROS:   Please see the history of present illness.    All other systems reviewed and are negative.  EKGs/Labs/Other Studies Reviewed:    The following studies were reviewed today: Cardiac Cath 05-21-2017: Conclusion  1. Severe native coronary artery disease with total occlusion of the left main and total occlusion of the RCA 2. Status post aortocoronary bypass surgery with continued patency of the LIMA to LAD and RIMA to RCA, patency of the saphenous vein graft to OM1 with a moderate eccentric lesion just distal to the graft insertion site, and patency of the saphenous vein graft to ramus intermedius with a critical lesion in the proximal body the graft treated successfully with PCI using distal embolic protection and a 4.0 mm drug-eluting stent  Recommendations: The patient will be hydrated for 6 hours. He will be discharged this evening with follow-up labs next week. Recommend aspirin, Plavix, and Eliquis 1 month, then discontinue aspirin. He should start back on Eliquis tomorrow evening.   Coronary Findings  Diagnostic Dominance: Right Left Main  Ost LM to LM lesion 100% stenosed  The left main is critically stenosed with minimal flow beyond the area of severe stenosis.  Right Coronary Artery  Mid RCA to Dist RCA lesion 100% stenosed  .  RIMA RIMA Graft To RPDA  RIMA graft was visualized by non-selective angiography. The RIMA to PDA graft is patent. The right subclavian artery is very tortuous and will not allow for a catheter to be advanced to the  area of the [place]. The vessel was imaged nonselectively and is demonstrated to be patent. The distal anastomosis is poorly visualized  but there is TIMI 3 flow.  Saphenous Graft To Ramus  SVG. The SVG to ramus intermedius has a critical degenerated 95% eccentric lesion with TIMI 2 flow beyond the lesion. The lesion is located in the proximal body of the graft.  Prox Graft lesion 95% stenosed  The lesion is eccentric. An AL-1 guide catheter is used. Angiomax was used for coagulation. The right femoral sheath was upsized to a 6 [place]. A therapeutic ACT is achieved. The lesion is crossed with cougar wire. Verapamil was administered through the guide once the bypass graft is selectively engaged. The lesion is predilated with a 2.5 mm balloon. Verapamil was administered again. A 5 mm spider distal embolic protection device was prepped and advanced beyond the lesion. The lesion is then stented with a 4.0 x 16 mm Promus DES deployed at 14 atm. The spider embolic protection device is removed and final angiography confirmed 0% residual stenosis and TIMI-3 flow.  Saphenous Graft To 1st Mrg  SVG. The SVG to OM is patent with diffuse irregularity. The OM branch where the vessel is tented up has an unusual appearance just beyond the distal anastomosis. There is at least 50-70% stenosis in that region.  LIMA LIMA Graft To Mid LAD  LIMA. The LIMA to mid LAD is widely patent with no stenosis. The LAD beyond the LIMA insertion site is patent.  Intervention  Prox Graft lesion (Saphenous Graft To Ramus)  Angioplasty  Lesion crossed with guidewire. Pre-stent angioplasty was performed. A STENT PROMUS PREM MR [id number] drug eluting stent was successfully placed. Post-stent angioplasty was not performed. The pre-interventional distal flow is decreased (TIMI 2). The post-interventional distal flow is normal (TIMI 3). No complications occurred at this lesion.  There is a 0% residual stenosis post intervention.  Coronary  Diagrams  Diagnostic Dominance: Right  Intervention    Carotid US 02-07-2020: Summary:  Right Carotid: Patent right carotid endarterectomy site with no evidence  for         restenosis. Velocities in the right ICA are consistent with  a         1-39% stenosis.   Left Carotid: Velocities in the left ICA are consistent with a 60-79%  stenosis.        Calcified plaque prohibits adequate visualization. The ECA  appears        >50% stenosed.   Vertebrals: Bilateral vertebral arteries demonstrate antegrade flow.  Subclavians: Normal flow hemodynamics were seen in bilateral subclavian        arteries.   EKG:  EKG is ordered today.  The ekg ordered today demonstrates ventricular pacing 60 bpm, probable atrial fibrillation underlying rhythm  Recent Labs: 01/25/2020: ALT 15; TSH 3.885 04/24/2020: BUN 33; Creatinine, Ser 2.14; Hemoglobin 10.3; Magnesium 2.3; Platelets 135; Potassium 4.7; Sodium 140  Recent Lipid Panel    Component Value Date/Time   CHOL 114 11/21/2019 0610   TRIG 69 11/21/2019 0610   HDL 38 (L) 11/21/2019 0610   CHOLHDL 3.0 11/21/2019 0610   VLDL 14 11/21/2019 0610   LDLCALC 62 11/21/2019 0610   LDLDIRECT 67.0 09/16/2016 1455    Physical Exam:    VS:  BP 126/64   Pulse 60   Ht _0  (1.88 m)   Wt 217 lb (98.4 kg)   BMI 27.86 kg/m     Wt Readings from Last 3 Encounters:  04/24/20 217 lb (98.4 kg)  04/22/20 216 lb 12.8 oz (98.3 kg)  04/15/20 215 lb (97.5 kg)  GEN:  Well nourished, well developed elderly male in no acute distress HEENT: Normal NECK: No JVD; No carotid bruits LYMPHATICS: No lymphadenopathy CARDIAC: RRR, no murmurs, rubs, gallops RESPIRATORY:  Clear to auscultation without rales, wheezing or rhonchi  ABDOMEN: Soft, non-tender, non-distended MUSCULOSKELETAL:  No edema; No deformity  SKIN: Warm and dry NEUROLOGIC:  Alert and oriented x 3 PSYCHIATRIC:  Normal affect   ASSESSMENT:    1.  Coronary artery disease involving native coronary artery of native heart with angina pectoris (HCC)   2. Persistent atrial fibrillation ([place])   3. Mixed hyperlipidemia   4. Essential hypertension   5. CKD (chronic kidney disease) stage 4, GFR 15-29 ml/min (HCC)    PLAN:    In order of problems listed above:  1. The patient's symptoms of exertional dyspnea and fatigue may be his anginal equivalent.  He has had similar symptoms in the past associated with severe vein graft stenosis, improved after PCI.  He really has not appreciated any significant benefit from cardioversion or permanent pacemaker placement.  The patient had an echocardiogram performed yesterday at the [facility] medical center and we will do our best to obtain the results of the study.  We discussed considerations around cardiac catheterization and possible PCI.  I reviewed his last heart catheterization films from 2018.  I explained that the potential benefit of cardiac catheterization and PCI have to be weighed against the risks as he now has progressive chronic kidney disease.  After thorough discussion, they would like to proceed.  Will use our hydration protocol and ask him to come in for 4 hours of IV fluids prior to the procedure.  Will limit contrast as much as possible.  If he requires PCI, this may have to be done in staged fashion depending on the amount of contrast used.  In reviewing his last cardiac catheterization films, the RIMA graft could not be selectively engaged from femoral access.  May have to do dual access with right femoral and right radial to evaluate all of his graft anatomy. I have reviewed the risks, indications, and alternatives to cardiac catheterization, possible angioplasty, and stenting with the patient. Risks include but are not limited to bleeding, infection, vascular injury, stroke, myocardial infection, arrhythmia, kidney injury, radiation-related injury in the case of prolonged fluoroscopy use, emergency  cardiac surgery, and death. The patient understands the risks of serious complication is 1-2 in 3532 with diagnostic cardiac cath and 1-2% or less with angioplasty/stenting. Will wait until he is 4 weeks out from cardioversion before interrupting anticoagulation for cardiac cath. 2. Will have his device interrogated.  He continues on apixaban for anticoagulation.  We will stop metoprolol due to relative hypotension.  Will discuss with [name] whether he should remain on amiodarone depending on the findings of his device interrogation. 3. Continue atorvastatin 4. Blood pressures have been low.  I reviewed his home readings.  He has multiple readings in the low 100s and also a few below 90.  I asked him to stop metoprolol. 5. Reviewed medication list.  Not on any nephrotoxic drugs.  Fluid hydration protocol for cardiac catheterization as outlined.  Stop metoprolol to allow for higher perfusion pressure.   Medication Adjustments/Labs and Tests Ordered: Current medicines are reviewed at length with the patient today.  Concerns regarding medicines are outlined above.  Orders Placed This Encounter  Procedures  . Basic metabolic panel  . CBC with Differential/Platelet  . Magnesium  . Basic metabolic panel  . CBC with Differential/Platelet  .  EKG 12-Lead   No orders of the defined types were placed in this encounter.   Patient Instructions  Medication Instructions:  1) STOP METOPROLOL *If you need a refill on your cardiac medications before your next appointment, please call your pharmacy*  Testing/Procedures: Your physician has requested that you have a cardiac catheterization. Cardiac catheterization is used to diagnose and/or treat various heart conditions. Doctors may recommend this procedure for a number of different reasons. The most common reason is to evaluate chest pain. Chest pain can be a symptom of coronary artery disease (CAD), and cardiac catheterization can show whether plaque is  narrowing or blocking your heart's arteries. This procedure is also used to evaluate the valves, as well as measure the blood flow and oxygen levels in different parts of your heart. For further information please visit HugeFiesta.[email or web address]. Please follow instruction sheet, as given.   COVID SCREENING AND LAB INFORMATION (6/18): You will need to have labs drawn on 6/18 at Dr. Antionette Char office. You do not need to be fasting. Please come to get your labs drawn BEFORE your COVID test. The lab opens at 7:30AM.  You are scheduled for your drive-thru COVID screening on: 05/17/2020 at 11:00AM. [place] [facility] (old [facility]) [address] Stay in the RIGHT lane and proceed under the brick awning and tell them you are there for pre-procedure testing. Do NOT bring any pets with you to the testing site. You will need to go home after your screening and quarantine until your procedure.   CATHETERIZATION INSTRUCTIONS (6/21): You are scheduled for a Cardiac Catheterization on: 05/20/2020 with Dr. Burt Knack   1. Please arrive at the [facility] (Main Entrance A) at [facility]: [address] [place], [place] [address] at: 7:00AM (This time is two hours before your procedure to ensure your preparation). Free valet parking service is available. You are allowed ONE visitor in the waiting room during your procedure. Both you and your guest must wear masks. Special note: Every effort is made to have your procedure done on time. Please understand that emergencies sometimes delay scheduled procedures.  2. Diet: Do not eat solid foods after midnight.  You may have clear liquids until 5am upon the day of the procedure.  3. Labs: TODAY! BMET, CBC, Mag  4. Medication instructions in preparation for your procedure:  1) HOLD ELIQUIS 2 days prior to your procedure (Take last dose 6/18 in the PM)  2) HOLD HYDRALAZINE the morning of your procedure  3) MAKE SURE TO TAKE TOUR ASPIRIN the  morning of your cath  4) You may take your other meds as directed with sips of water   5. Plan for one night stay--bring personal belongings. 6. Bring a current list of your medications and current insurance cards. 7. You MUST have a responsible person to drive you home. 8. Someone MUST be with you the first 24 hours after you arrive home or your discharge will be delayed. 9. Please wear clothes that are easy to get on and off and wear slip-on shoes.  Thank you for allowing [place] to care for you!   -- [facility] Invasive Cardiovascular services    Signed, [name], MD  04/25/2020 7:40 AM    [place]

## 2020-04-24 NOTE — Telephone Encounter (Signed)
I helped the pt send a manual transmission.  Transmission received.I told him the nurse will review it and give him a call back.

## 2020-04-24 NOTE — Patient Instructions (Addendum)
Medication Instructions:  1) STOP METOPROLOL *If you need a refill on your cardiac medications before your next appointment, please call your pharmacy*  Testing/Procedures: Your physician has requested that you have a cardiac catheterization. Cardiac catheterization is used to diagnose and/or treat various heart conditions. Doctors may recommend this procedure for a number of different reasons. The most common reason is to evaluate chest pain. Chest pain can be a symptom of coronary artery disease (CAD), and cardiac catheterization can show whether plaque is narrowing or blocking your heart's arteries. This procedure is also used to evaluate the valves, as well as measure the blood flow and oxygen levels in different parts of your heart. For further information please visit HugeFiesta[employer][email or web address]. Please follow instruction sheet, as given.   COVID SCREENING AND LAB INFORMATION (6/18): You will need to have labs drawn on 6/18 at Dr. Antionette Char office. You do not need to be fasting. Please come to get your labs drawn BEFORE your COVID test. The lab opens at 7:30AM.  You are scheduled for your drive-thru COVID screening on: 05/17/2020 at 11:00AM. [place] [facility] (old [facility]) [address] Stay in the RIGHT lane and proceed under the brick awning and tell them you are there for pre-procedure testing. Do NOT bring any pets with you to the testing site. You will need to go home after your screening and quarantine until your procedure.   CATHETERIZATION INSTRUCTIONS (6/21): You are scheduled for a Cardiac Catheterization on: 05/20/2020 with Dr. Burt Knack   1. Please arrive at the [facility] (Main Entrance A) at [facility]: [address] [place], [place] [address] at: 7:00AM (This time is two hours before your procedure to ensure your preparation). Free valet parking service is available. You are allowed ONE visitor in the waiting room during your procedure. Both you and  your guest must wear masks. Special note: Every effort is made to have your procedure done on time. Please understand that emergencies sometimes delay scheduled procedures.  2. Diet: Do not eat solid foods after midnight.  You may have clear liquids until 5am upon the day of the procedure.  3. Labs: TODAY! BMET, CBC, Mag  4. Medication instructions in preparation for your procedure:  1) HOLD ELIQUIS 2 days prior to your procedure (Take last dose 6/18 in the PM)  2) HOLD HYDRALAZINE the morning of your procedure  3) MAKE SURE TO TAKE TOUR ASPIRIN the morning of your cath  4) You may take your other meds as directed with sips of water   5. Plan for one night stay--bring personal belongings. 6. Bring a current list of your medications and current insurance cards. 7. You MUST have a responsible person to drive you home. 8. Someone MUST be with you the first 24 hours after you arrive home or your discharge will be delayed. 9. Please wear clothes that are easy to get on and off and wear slip-on shoes.  Thank you for allowing [place] to care for you!   -- [place] Invasive Cardiovascular services

## 2020-04-24 NOTE — Telephone Encounter (Addendum)
Transmission reviewed and patient is currently in AF with controlled v-rates and has been on AF since 04/18/20. He reports that he feels "bad" when he does anything . Reports SOB with activity, intermittent dizziness, and no energy. He denies CP, palpitations or syncope.+ Eliquis.

## 2020-04-25 ENCOUNTER — Telehealth: Payer: Self-pay | Admitting: Internal Medicine

## 2020-04-25 ENCOUNTER — Encounter: Payer: Self-pay | Admitting: Cardiovascular Disease

## 2020-04-25 NOTE — Telephone Encounter (Signed)
Called patient to assess. Patient reports of stitch protruding for sometime now but recently has come to surface more. States wife attempted to pull today but was unable to.  Advised patient against this d/t increasing risk of infection to site. Verbalizes understanding. Patient reports of a small amount of redness near stitch area. No drainage, swelling, fever or chills. Offered patient wound check apt, patient agreeable. Advised patient to call DC if any further questions or concerns should arise. Verbalizes understanding.

## 2020-04-25 NOTE — Telephone Encounter (Signed)
Patient calling stating there is a stitch sticking up out of the wound of his pacer. He states his wife tried to pull it out, but was unsuccessful. Please advise.

## 2020-04-26 NOTE — Telephone Encounter (Signed)
Thanks. I stopped his low dose metoprolol because of relative hypotension. He is still on amiodarone I think 100 mg daily. Will see how he does with cath - waiting 4 weeks after DCCV to do. I think he sees Carleene Overlie this summer and we should consider stopping amiodarone.

## 2020-04-30 ENCOUNTER — Ambulatory Visit (INDEPENDENT_AMBULATORY_CARE_PROVIDER_SITE_OTHER): Payer: Medicare Other | Admitting: Emergency Medicine

## 2020-04-30 ENCOUNTER — Other Ambulatory Visit: Payer: Self-pay

## 2020-04-30 ENCOUNTER — Other Ambulatory Visit (HOSPITAL_COMMUNITY): Payer: Medicare Other

## 2020-04-30 DIAGNOSIS — I442 Atrioventricular block, complete: Secondary | ICD-10-CM | POA: Diagnosis not present

## 2020-04-30 NOTE — Progress Notes (Signed)
Patient presents with visible stitch at distal edge of wound site. Stitch removed . Triple antibiotic ointment and band aid placed over area where stitch removed. No redness , or drainage at wound site. Patient to contact office if he develops any s/sx of infection.

## 2020-05-01 ENCOUNTER — Ambulatory Visit (INDEPENDENT_AMBULATORY_CARE_PROVIDER_SITE_OTHER): Payer: Medicare Other | Admitting: Podiatry

## 2020-05-01 ENCOUNTER — Telehealth: Payer: Self-pay | Admitting: Cardiovascular Disease

## 2020-05-01 ENCOUNTER — Encounter: Payer: Self-pay | Admitting: Podiatry

## 2020-05-01 VITALS — Temp 97.3°F

## 2020-05-01 DIAGNOSIS — M79675 Pain in left toe(s): Secondary | ICD-10-CM | POA: Diagnosis not present

## 2020-05-01 DIAGNOSIS — E1142 Type 2 diabetes mellitus with diabetic polyneuropathy: Secondary | ICD-10-CM | POA: Diagnosis not present

## 2020-05-01 DIAGNOSIS — D689 Coagulation defect, unspecified: Secondary | ICD-10-CM

## 2020-05-01 DIAGNOSIS — E1140 Type 2 diabetes mellitus with diabetic neuropathy, unspecified: Secondary | ICD-10-CM

## 2020-05-01 DIAGNOSIS — M79674 Pain in right toe(s): Secondary | ICD-10-CM

## 2020-05-01 DIAGNOSIS — B351 Tinea unguium: Secondary | ICD-10-CM | POA: Diagnosis not present

## 2020-05-01 NOTE — Telephone Encounter (Signed)
Pt c/o medication issue:  1. Name of Medication: apixaban (ELIQUIS) 2.5 MG TABS tablet  2. How are you currently taking this medication (dosage and times per day)? As directed  3. Are you having a reaction (difficulty breathing--STAT)? no  4. What is your medication issue? Patient states he forgot to take his Eliquis the day of his OV with Dr. Burt Knack on 04/24/20. He had some lab work and an EKG done that day and wants to know if this would have affected the results.

## 2020-05-01 NOTE — Telephone Encounter (Signed)
See my chart message

## 2020-05-01 NOTE — Progress Notes (Signed)
This patient returns to my office for at risk foot care.  This patient requires this care by a professional since this patient will be at risk due to having coagulation defect due to eliquis.  This patient is unable to cut nails himself since the patient cannot reach his nails.These nails are painful walking and wearing shoes.  This patient presents for at risk foot care today.  General Appearance  Alert, conversant and in no acute stress.  Vascular  Dorsalis pedis and posterior tibial  pulses are weakly  palpable  bilaterally.  Capillary return is within normal limits  bilaterally. Temperature is within normal limits  bilaterally.  Neurologic  Senn-Weinstein monofilament wire test diminished   bilaterally. Muscle power within normal limits bilaterally.  Nails Thick disfigured discolored nails with subungual debris  from hallux to fifth toes bilaterally. No evidence of bacterial infection or drainage bilaterally.  Orthopedic  No limitations of motion  feet .  No crepitus or effusions noted.  No bony pathology or digital deformities noted.  Bony prominence sub 5th metabase right.   Skin  normotropic skin with no porokeratosis noted bilaterally.  No signs of infections or ulcers noted.     Onychomycosis  Pain in right toes  Pain in left toes  Consent was obtained for treatment procedures.   Mechanical debridement of nails 1-5  bilaterally performed with a nail nipper.  Filed with dremel without incident.    Return office visit   3 months                   Told patient to return for periodic foot care and evaluation due to potential at risk complications.   Gardiner Barefoot DPM

## 2020-05-03 ENCOUNTER — Other Ambulatory Visit: Payer: Self-pay

## 2020-05-03 DIAGNOSIS — N182 Chronic kidney disease, stage 2 (mild): Secondary | ICD-10-CM

## 2020-05-03 DIAGNOSIS — E1122 Type 2 diabetes mellitus with diabetic chronic kidney disease: Secondary | ICD-10-CM

## 2020-05-03 MED ORDER — GLUCOSE BLOOD VI STRP: ORAL_STRIP | 11 refills | Status: DC

## 2020-05-09 ENCOUNTER — Other Ambulatory Visit: Payer: Self-pay

## 2020-05-09 DIAGNOSIS — E1122 Type 2 diabetes mellitus with diabetic chronic kidney disease: Secondary | ICD-10-CM

## 2020-05-09 DIAGNOSIS — N182 Chronic kidney disease, stage 2 (mild): Secondary | ICD-10-CM

## 2020-05-09 MED ORDER — GLUCOSE BLOOD VI STRP: ORAL_STRIP | 11 refills | Status: DC

## 2020-05-16 ENCOUNTER — Other Ambulatory Visit: Payer: Self-pay

## 2020-05-16 ENCOUNTER — Other Ambulatory Visit: Payer: Medicare Other | Admitting: *Deleted

## 2020-05-16 ENCOUNTER — Telehealth: Payer: Self-pay | Admitting: *Deleted

## 2020-05-16 DIAGNOSIS — I25119 Atherosclerotic heart disease of native coronary artery with unspecified angina pectoris: Secondary | ICD-10-CM

## 2020-05-16 LAB — CBC WITH DIFFERENTIAL/PLATELET
Basophils Absolute: 0.1 10*3/uL (ref 0.0–0.2)
Basos: 1 %
EOS (ABSOLUTE): 0.5 10*3/uL — ABNORMAL HIGH (ref 0.0–0.4)
Eos: 10 %
Hematocrit: 30.8 % — ABNORMAL LOW (ref 37.5–51.0)
Hemoglobin: 10 g/dL — ABNORMAL LOW (ref 13.0–17.7)
Lymphocytes Absolute: 1.1 10*3/uL (ref 0.7–3.1)
Lymphs: 22 %
MCH: 28.7 pg (ref 26.6–33.0)
MCHC: 32.5 g/dL (ref 31.5–35.7)
MCV: 88 fL (ref 79–97)
Monocytes Absolute: 0.5 10*3/uL (ref 0.1–0.9)
Monocytes: 9 %
Neutrophils Absolute: 2.8 10*3/uL (ref 1.4–7.0)
Neutrophils: 58 %
Platelets: 139 10*3/uL — ABNORMAL LOW (ref 150–450)
RBC: 3.49 x10E6/uL — ABNORMAL LOW (ref 4.14–5.80)
RDW: 15.7 % — ABNORMAL HIGH (ref 11.6–15.4)
WBC: 4.9 10*3/uL (ref 3.4–10.8)

## 2020-05-16 LAB — BASIC METABOLIC PANEL
BUN/Creatinine Ratio: 13 (ref 10–24)
BUN: 29 mg/dL — ABNORMAL HIGH (ref 8–27)
CO2: 28 mmol/L (ref 20–29)
Calcium: 8.7 mg/dL (ref 8.6–10.2)
Chloride: 103 mmol/L (ref 96–106)
Creatinine, Ser: 2.15 mg/dL — ABNORMAL HIGH (ref 0.76–1.27)
GFR calc Af Amer: 32 mL/min/{1.73_m2} — ABNORMAL LOW (ref >59)
GFR calc non Af Amer: 28 mL/min/{1.73_m2} — ABNORMAL LOW (ref >59)
Glucose: 144 mg/dL — ABNORMAL HIGH (ref 65–99)
Potassium: 4.4 mmol/L (ref 3.5–5.2)
Sodium: 138 mmol/L (ref 134–144)

## 2020-05-16 NOTE — Telephone Encounter (Signed)
Pt contacted pre-catheterization scheduled at [facility] for: Monday May 20, 2020 12 noon Verified arrival time and place: [name]) at: 7 AM-pre-procedure hydration    No solid food after midnight prior to cath, clear liquids until 5 AM day of procedure.  Hold: Eliquis-none 05/18/20 until post procedure.   Except hold medications AM meds can be  taken pre-cath with sips of water including: ASA 81 mg   Confirmed patient has responsible adult to drive home post procedure and observe 24 hours after arriving home: yes  You are allowed ONE visitor in the waiting room during your procedure. Both you and your visitor must wear masks.      COVID-19 Pre-Screening Questions:  . In the past 7 to 10 days have you had a new cough, shortness of breath, headache, congestion, fever (100 or greater) unexplained body aches, new sore throat, or sudden loss of taste or sense of smell? no . In the past 7 to 10 days have you been around anyone with known Covid 19? no   Reviewed procedure/mask/visitor instructions, COVID-19 screening questions with patient.

## 2020-05-17 ENCOUNTER — Other Ambulatory Visit (HOSPITAL_COMMUNITY): Payer: Medicare Other

## 2020-05-17 ENCOUNTER — Other Ambulatory Visit: Payer: Medicare Other

## 2020-05-20 ENCOUNTER — Encounter (HOSPITAL_COMMUNITY)
Admission: RE | Disposition: A | Discharge status: Home / Self Care | Payer: Self-pay | Source: Home / Self Care | Attending: Cardiovascular Disease

## 2020-05-20 ENCOUNTER — Ambulatory Visit (HOSPITAL_COMMUNITY)
Admission: RE | Admit: 2020-05-20 | Discharge: 2020-05-20 | Disposition: A | Discharge status: Home / Self Care | Payer: Medicare Other | Attending: Cardiovascular Disease | Admitting: Cardiovascular Disease

## 2020-05-20 ENCOUNTER — Other Ambulatory Visit: Payer: Self-pay

## 2020-05-20 DIAGNOSIS — N184 Chronic kidney disease, stage 4 (severe): Secondary | ICD-10-CM | POA: Insufficient documentation

## 2020-05-20 DIAGNOSIS — E782 Mixed hyperlipidemia: Secondary | ICD-10-CM | POA: Insufficient documentation

## 2020-05-20 DIAGNOSIS — I25119 Atherosclerotic heart disease of native coronary artery with unspecified angina pectoris: Secondary | ICD-10-CM | POA: Diagnosis not present

## 2020-05-20 DIAGNOSIS — Z7901 Long term (current) use of anticoagulants: Secondary | ICD-10-CM | POA: Diagnosis not present

## 2020-05-20 DIAGNOSIS — I714 Abdominal aortic aneurysm, without rupture: Secondary | ICD-10-CM | POA: Insufficient documentation

## 2020-05-20 DIAGNOSIS — Z79899 Other long term (current) drug therapy: Secondary | ICD-10-CM | POA: Diagnosis not present

## 2020-05-20 DIAGNOSIS — I2582 Chronic total occlusion of coronary artery: Secondary | ICD-10-CM | POA: Diagnosis not present

## 2020-05-20 DIAGNOSIS — I25118 Atherosclerotic heart disease of native coronary artery with other forms of angina pectoris: Secondary | ICD-10-CM

## 2020-05-20 DIAGNOSIS — Z951 Presence of aortocoronary bypass graft: Secondary | ICD-10-CM | POA: Insufficient documentation

## 2020-05-20 DIAGNOSIS — I129 Hypertensive chronic kidney disease with stage 1 through stage 4 chronic kidney disease, or unspecified chronic kidney disease: Secondary | ICD-10-CM | POA: Diagnosis not present

## 2020-05-20 DIAGNOSIS — Z87891 Personal history of nicotine dependence: Secondary | ICD-10-CM | POA: Insufficient documentation

## 2020-05-20 DIAGNOSIS — E1122 Type 2 diabetes mellitus with diabetic chronic kidney disease: Secondary | ICD-10-CM | POA: Insufficient documentation

## 2020-05-20 DIAGNOSIS — I208 Other forms of angina pectoris: Secondary | ICD-10-CM | POA: Diagnosis present

## 2020-05-20 DIAGNOSIS — I25718 Atherosclerosis of autologous vein coronary artery bypass graft(s) with other forms of angina pectoris: Secondary | ICD-10-CM | POA: Diagnosis not present

## 2020-05-20 DIAGNOSIS — I4819 Other persistent atrial fibrillation: Secondary | ICD-10-CM | POA: Diagnosis not present

## 2020-05-20 HISTORY — PX: LEFT HEART CATH AND CORS/GRAFTS ANGIOGRAPHY: CATH118250

## 2020-05-20 LAB — GLUCOSE, CAPILLARY: Glucose-Capillary: 107 mg/dL — ABNORMAL HIGH (ref 70–99)

## 2020-05-20 SURGERY — LEFT HEART CATH AND CORS/GRAFTS ANGIOGRAPHY
Anesthesia: LOCAL

## 2020-05-20 MED ORDER — ACETAMINOPHEN 325 MG PO TABS: 650.0000 mg | ORAL_TABLET | ORAL | Status: DC | PRN

## 2020-05-20 MED ORDER — HEPARIN SODIUM (PORCINE) 1000 UNIT/ML IJ SOLN
INTRAMUSCULAR | Status: DC | PRN
  Administered 2020-05-20: 4000 [IU] via INTRAVENOUS

## 2020-05-20 MED ORDER — SODIUM CHLORIDE 0.9 % IV SOLN: 250.0000 mL | INTRAVENOUS | Status: DC | PRN

## 2020-05-20 MED ORDER — FENTANYL CITRATE (PF) 100 MCG/2ML IJ SOLN
INTRAMUSCULAR | Status: DC | PRN
  Administered 2020-05-20: 25 ug via INTRAVENOUS

## 2020-05-20 MED ORDER — MIDAZOLAM HCL 2 MG/2ML IJ SOLN
INTRAMUSCULAR | Status: AC
  Filled 2020-05-20: qty 2

## 2020-05-20 MED ORDER — SODIUM CHLORIDE 0.9% FLUSH: 3.0000 mL | Freq: Two times a day (BID) | INTRAVENOUS | Status: DC

## 2020-05-20 MED ORDER — ASPIRIN 81 MG PO CHEW: 81.0000 mg | CHEWABLE_TABLET | ORAL | Status: DC

## 2020-05-20 MED ORDER — HEPARIN (PORCINE) IN NACL 1000-0.9 UT/500ML-% IV SOLN
INTRAVENOUS | Status: AC
  Filled 2020-05-20: qty 1000

## 2020-05-20 MED ORDER — LIDOCAINE HCL (PF) 1 % IJ SOLN
INTRAMUSCULAR | Status: DC | PRN
  Administered 2020-05-20: 25 mL

## 2020-05-20 MED ORDER — SODIUM CHLORIDE 0.9 % WEIGHT BASED INFUSION: 1.0000 mL/kg/h | INTRAVENOUS | Status: DC

## 2020-05-20 MED ORDER — IOHEXOL 350 MG/ML SOLN
INTRAVENOUS | Status: AC
  Filled 2020-05-20: qty 1

## 2020-05-20 MED ORDER — HYDRALAZINE HCL 20 MG/ML IJ SOLN
INTRAMUSCULAR | Status: DC | PRN
  Administered 2020-05-20: 10 mg via INTRAVENOUS

## 2020-05-20 MED ORDER — VERAPAMIL HCL 2.5 MG/ML IV SOLN
INTRAVENOUS | Status: DC | PRN
  Administered 2020-05-20: 10 mL via INTRA_ARTERIAL

## 2020-05-20 MED ORDER — LABETALOL HCL 5 MG/ML IV SOLN
INTRAVENOUS | Status: DC | PRN
  Administered 2020-05-20: 20 mg via INTRAVENOUS

## 2020-05-20 MED ORDER — SODIUM CHLORIDE 0.9% FLUSH: 3.0000 mL | INTRAVENOUS | Status: DC | PRN

## 2020-05-20 MED ORDER — VERAPAMIL HCL 2.5 MG/ML IV SOLN
INTRAVENOUS | Status: AC
  Filled 2020-05-20: qty 2

## 2020-05-20 MED ORDER — IOHEXOL 350 MG/ML SOLN
INTRAVENOUS | Status: DC | PRN
  Administered 2020-05-20: 30 mL via INTRA_ARTERIAL

## 2020-05-20 MED ORDER — ONDANSETRON HCL 4 MG/2ML IJ SOLN: 4.0000 mg | Freq: Four times a day (QID) | INTRAMUSCULAR | Status: DC | PRN

## 2020-05-20 MED ORDER — LABETALOL HCL 5 MG/ML IV SOLN
INTRAVENOUS | Status: AC
  Filled 2020-05-20: qty 4

## 2020-05-20 MED ORDER — HEPARIN (PORCINE) IN NACL 1000-0.9 UT/500ML-% IV SOLN
INTRAVENOUS | Status: DC | PRN
  Administered 2020-05-20 (×2): 500 mL

## 2020-05-20 MED ORDER — HYDRALAZINE HCL 20 MG/ML IJ SOLN: 10.0000 mg | INTRAMUSCULAR | Status: DC | PRN

## 2020-05-20 MED ORDER — HYDRALAZINE HCL 20 MG/ML IJ SOLN
INTRAMUSCULAR | Status: AC
  Filled 2020-05-20: qty 1

## 2020-05-20 MED ORDER — MIDAZOLAM HCL 2 MG/2ML IJ SOLN
INTRAMUSCULAR | Status: DC | PRN
  Administered 2020-05-20: 1 mg via INTRAVENOUS

## 2020-05-20 MED ORDER — HEPARIN SODIUM (PORCINE) 1000 UNIT/ML IJ SOLN
INTRAMUSCULAR | Status: AC
  Filled 2020-05-20: qty 1

## 2020-05-20 MED ORDER — LABETALOL HCL 5 MG/ML IV SOLN: 10.0000 mg | INTRAVENOUS | Status: DC | PRN

## 2020-05-20 MED ORDER — LIDOCAINE HCL (PF) 1 % IJ SOLN
INTRAMUSCULAR | Status: AC
  Filled 2020-05-20: qty 30

## 2020-05-20 MED ORDER — FENTANYL CITRATE (PF) 100 MCG/2ML IJ SOLN
INTRAMUSCULAR | Status: AC
  Filled 2020-05-20: qty 2

## 2020-05-20 MED ORDER — SODIUM CHLORIDE 0.9 % WEIGHT BASED INFUSION
3.0000 mL/kg/h | INTRAVENOUS | Status: AC
  Administered 2020-05-20: 3 mL/kg/h via INTRAVENOUS

## 2020-05-20 SURGICAL SUPPLY — 14 items
CATH INFINITI 5 FR IM (CATHETERS) ×1 IMPLANT
CATH INFINITI 5FR MULTPACK ANG (CATHETERS) ×1 IMPLANT
CLOSURE MYNX CONTROL 5F (Vascular Products) ×1 IMPLANT
GLIDESHEATH SLEND SS 6F .021 (SHEATH) ×1 IMPLANT
GUIDEWIRE INQWIRE 1.5J.035X260 (WIRE) IMPLANT
INQWIRE 1.5J .035X260CM (WIRE) ×2
KIT HEART LEFT (KITS) ×2 IMPLANT
KIT MICROPUNCTURE NIT STIFF (SHEATH) ×1 IMPLANT
PACK CARDIAC CATHETERIZATION (CUSTOM PROCEDURE TRAY) ×2 IMPLANT
SHEATH PINNACLE 5F 10CM (SHEATH) ×1 IMPLANT
SHEATH PROBE COVER 6X72 (BAG) ×1 IMPLANT
TRANSDUCER W/STOPCOCK (MISCELLANEOUS) ×2 IMPLANT
TUBING CIL FLEX 10 FLL-RA (TUBING) ×2 IMPLANT
WIRE EMERALD 3MM-J .035X150CM (WIRE) ×1 IMPLANT

## 2020-05-20 NOTE — Interval H&P Note (Signed)
History and Physical Interval Note:  05/20/2020 12:21 PM  Thomas Marshall  has presented today for surgery, with the diagnosis of cad - angina.  The various methods of treatment have been discussed with the patient and family. After consideration of risks, benefits and other options for treatment, the patient has consented to  Procedure(s): LEFT HEART CATH AND CORS/GRAFTS ANGIOGRAPHY (N/A) as a surgical intervention.  The patient's history has been reviewed, patient examined, no change in status, stable for surgery.  I have reviewed the patient's chart and labs.  Questions were answered to the patient's satisfaction.     [name]

## 2020-05-20 NOTE — Discharge Instructions (Signed)
Angiogram, Care After °This sheet gives you information about how to care for yourself after your procedure. Your health care provider may also give you more specific instructions. If you have problems or questions, contact your health care provider. °What can I expect after the procedure? °After the procedure, it is common to have bruising and tenderness at the catheter insertion area. °Follow these instructions at home: °Insertion site care °· Follow instructions from your health care provider about how to take care of your insertion site. Make sure you: °? Wash your hands with soap and water before you change your bandage (dressing). If soap and water are not available, use hand sanitizer. °? Change your dressing as told by your health care provider. °? Leave stitches (sutures), skin glue, or adhesive strips in place. These skin closures may need to stay in place for 2 weeks or longer. If adhesive strip edges start to loosen and curl up, you may trim the loose edges. Do not remove adhesive strips completely unless your health care provider tells you to do that. °· Do not take baths, swim, or use a hot tub until your health care provider approves. °· You may shower 24-48 hours after the procedure or as told by your health care provider. °? Gently wash the site with plain soap and water. °? Pat the area dry with a clean towel. °? Do not rub the site. This may cause bleeding. °· Do not apply powder or lotion to the site. Keep the site clean and dry. °· Check your insertion site every day for signs of infection. Check for: °? Redness, swelling, or pain. °? Fluid or blood. °? Warmth. °? Pus or a bad smell. °Activity °· Rest as told by your health care provider, usually for 1-2 days. °· Do not lift anything that is heavier than 10 lbs. (4.5 kg) or as told by your health care provider. °· Do not drive for 24 hours if you were given a medicine to help you relax (sedative). °· Do not drive or use heavy machinery while  taking prescription pain medicine. °General instructions ° °· Return to your normal activities as told by your health care provider, usually in about a week. Ask your health care provider what activities are safe for you. °· If the catheter site starts bleeding, lie flat and put pressure on the site. If the bleeding does not stop, get help right away. This is a medical emergency. °· Drink enough fluid to keep your urine clear or pale yellow. This helps flush the contrast dye from your body. °· Take over-the-counter and prescription medicines only as told by your health care provider. °· Keep all follow-up visits as told by your health care provider. This is important. °Contact a health care provider if: °· You have a fever or chills. °· You have redness, swelling, or pain around your insertion site. °· You have fluid or blood coming from your insertion site. °· The insertion site feels warm to the touch. °· You have pus or a bad smell coming from your insertion site. °· You have bruising around the insertion site. °· You notice blood collecting in the tissue around the catheter site (hematoma). The hematoma may be painful to the touch. °Get help right away if: °· You have severe pain at the catheter insertion area. °· The catheter insertion area swells very fast. °· The catheter insertion area is bleeding, and the bleeding does not stop when you hold steady pressure on the area. °·   The area near or just beyond the catheter insertion site becomes pale, cool, tingly, or numb. These symptoms may represent a serious problem that is an emergency. Do not wait to see if the symptoms will go away. Get medical help right away. Call your local emergency services (911 in the [place] Do not drive yourself to the hospital. Summary  After the procedure, it is common to have bruising and tenderness at the catheter insertion area.  After the procedure, it is important to rest and drink plenty of fluids.  Do not take baths,  swim, or use a hot tub until your health care provider says it is okay to do so. You may shower 24-48 hours after the procedure or as told by your health care provider.  If the catheter site starts bleeding, lie flat and put pressure on the site. If the bleeding does not stop, get help right away. This is a medical emergency. This information is not intended to replace advice given to you by your health care provider. Make sure you discuss any questions you have with your health care provider. Document Revised: 10/29/2017 Document Reviewed: 10/21/2016 Elsevier Patient Education  2020 [place]  This sheet gives you information about how to care for yourself after your procedure. Your health care provider may also give you more specific instructions. If you have problems or questions, contact your health care provider. What can I expect after the procedure? After the procedure, it is common to have:  Bruising and tenderness at the catheter insertion area. Follow these instructions at home: Medicines  Take over-the-counter and prescription medicines only as told by your health care provider. Insertion site care  Follow instructions from your health care provider about how to take care of your insertion site. Make sure you: ? Wash your hands with soap and water before you change your bandage (dressing). If soap and water are not available, use hand sanitizer. ? Change your dressing as told by your health care provider. ? Leave stitches (sutures), skin glue, or adhesive strips in place. These skin closures may need to stay in place for 2 weeks or longer. If adhesive strip edges start to loosen and curl up, you may trim the loose edges. Do not remove adhesive strips completely unless your health care provider tells you to do that.  Check your insertion site every day for signs of infection. Check for: ? Redness, swelling, or pain. ? Fluid or blood. ? Pus or a bad  smell. ? Warmth.  Do not take baths, swim, or use a hot tub until your health care provider approves.  You may shower 24-48 hours after the procedure, or as directed by your health care provider. ? Remove the dressing and gently wash the site with plain soap and water. ? Pat the area dry with a clean towel. ? Do not rub the site. That could cause bleeding.  Do not apply powder or lotion to the site. Activity   For 24 hours after the procedure, or as directed by your health care provider: ? Do not flex or bend the affected arm. ? Do not push or pull heavy objects with the affected arm. ? Do not drive yourself home from the hospital or clinic. You may drive 24 hours after the procedure unless your health care provider tells you not to. ? Do not operate machinery or power tools.  Do not lift anything that is heavier than 10 lb (4.5 kg), or  the limit that you are told, until your health care provider says that it is safe.  Ask your health care provider when it is okay to: ? Return to work or school. ? Resume usual physical activities or sports. ? Resume sexual activity. General instructions  If the catheter site starts to bleed, raise your arm and put firm pressure on the site. If the bleeding does not stop, get help right away. This is a medical emergency.  If you went home on the same day as your procedure, a responsible adult should be with you for the first 24 hours after you arrive home.  Keep all follow-up visits as told by your health care provider. This is important. Contact a health care provider if:  You have a fever.  You have redness, swelling, or yellow drainage around your insertion site. Get help right away if:  You have unusual pain at the radial site.  The catheter insertion area swells very fast.  The insertion area is bleeding, and the bleeding does not stop when you hold steady pressure on the area.  Your arm or hand becomes pale, cool, tingly, or  numb. These symptoms may represent a serious problem that is an emergency. Do not wait to see if the symptoms will go away. Get medical help right away. Call your local emergency services (911 in the [place] Do not drive yourself to the hospital. Summary  After the procedure, it is common to have bruising and tenderness at the site.  Follow instructions from your health care provider about how to take care of your radial site wound. Check the wound every day for signs of infection.  Do not lift anything that is heavier than 10 lb (4.5 kg), or the limit that you are told, until your health care provider says that it is safe. This information is not intended to replace advice given to you by your health care provider. Make sure you discuss any questions you have with your health care provider. Document Revised: 12/22/2017 Document Reviewed: 12/22/2017 Elsevier Patient Education  2020 [employer].

## 2020-05-21 ENCOUNTER — Encounter (HOSPITAL_COMMUNITY): Payer: Self-pay | Admitting: Cardiovascular Disease

## 2020-06-10 ENCOUNTER — Ambulatory Visit (INDEPENDENT_AMBULATORY_CARE_PROVIDER_SITE_OTHER): Payer: Medicare Other | Admitting: Cardiovascular Disease

## 2020-06-10 ENCOUNTER — Encounter: Payer: Self-pay | Admitting: Cardiovascular Disease

## 2020-06-10 ENCOUNTER — Other Ambulatory Visit: Payer: Self-pay

## 2020-06-10 VITALS — BP 152/88 | HR 70 | Ht 74.0 in | Wt 218.6 lb

## 2020-06-10 DIAGNOSIS — I25119 Atherosclerotic heart disease of native coronary artery with unspecified angina pectoris: Secondary | ICD-10-CM

## 2020-06-10 DIAGNOSIS — I442 Atrioventricular block, complete: Secondary | ICD-10-CM

## 2020-06-10 DIAGNOSIS — I6523 Occlusion and stenosis of bilateral carotid arteries: Secondary | ICD-10-CM | POA: Diagnosis not present

## 2020-06-10 DIAGNOSIS — N184 Chronic kidney disease, stage 4 (severe): Secondary | ICD-10-CM

## 2020-06-10 DIAGNOSIS — E782 Mixed hyperlipidemia: Secondary | ICD-10-CM | POA: Diagnosis not present

## 2020-06-10 DIAGNOSIS — I4819 Other persistent atrial fibrillation: Secondary | ICD-10-CM

## 2020-06-10 MED ORDER — METOPROLOL TARTRATE 25 MG PO TABS
12.5000 mg | ORAL_TABLET | Freq: Two times a day (BID) | ORAL | 3 refills | Status: DC
Start: 2020-06-10 — End: 2020-06-18

## 2020-06-10 NOTE — Patient Instructions (Signed)
Medication Instructions:  1) STOP AMIODARONE 2) START METOPROLOL 12.5 mg twice daily *If you need a refill on your cardiac medications before your next appointment, please call your pharmacy*  Follow-Up: At [facility], you and your health needs are our priority.  As part of our continuing mission to provide you with exceptional heart care, we have created designated Provider Care Teams.  These Care Teams include your primary Cardiologist (physician) and Advanced Practice Providers (APPs -  Physician Assistants and Nurse Practitioners) who all work together to provide you with the care you need, when you need it. Your next appointment:   6 month(s) The format for your next appointment:   In Person Provider:   You may see [name], MD or one of the following Advanced Practice Providers on your designated Care Team:    Richardson Dopp, PA-C  [place], [place]

## 2020-06-10 NOTE — Progress Notes (Signed)
Cardiology Office Note:    Date:  06/11/2020   ID:  Thomas Marshall, DOB [birth year], MRN [id number]  PCP:  [name], MD  [facility] Cardiologist:  [name], MD  [facility] Electrophysiologist:  Cristopher Peru, MD   Referring MD: [name], MD   Chief Complaint  Patient presents with   Shortness of Breath    History of Present Illness:    Thomas Marshall is a 79 y.o. male with a history of coronary artery disease status post CABG, symptomatic PVCs status post ablation in 2018, paroxysmal atrial fibrillation now persistent, carotid arterial disease, hypertension, and stage IV chronic kidney disease, presenting for follow-up evaluation after undergoing recent cardiac catheterization.  He was last seen here in the office in May 2021 and at that time he was set up for cardiac catheterization because of symptoms suggestive of recurrent coronary ischemia.  The patient is here with his wife today.  He states that he continues to have "good days and bad days."  He admits to generalized fatigue and exertional dyspnea with any prolonged walking.  He was able to get out and mow his lawn with a riding mower recently.  He has not had any bleeding problems.  States that his groin site has healed up well following catheterization with no problems there.  No orthopnea, PND, or leg swelling.  No recent heart palpitations.  No chest pain or pressure.  Past Medical History:  Diagnosis Date   AAA (abdominal aortic aneurysm) ([place])    Arthritis    Cancer ([place])    skin & squamous cell   Carotid artery disease ([place])    a. s/p R CEA 12/2015.   CKD (chronic kidney disease), stage III    a. per historical labs.   Coronary artery disease    a. s/p CABG 2004.   Diabetes mellitus without complication (HCC)    GERD (gastroesophageal reflux disease)    Hyperlipidemia    Hypertension    Medication intolerance Multiple    PAF (paroxysmal atrial fibrillation) (HCC)    Pneumonia     Sinus bradycardia    a. baseline HR 50s-60s, also h/o bradycardia on metoprolol and carvedilol    Past Surgical History:  Procedure Laterality Date   ANKLE SURGERY     right fused   BACK SURGERY     low back    CARDIOVERSION N/A 02/14/2020   Procedure: CARDIOVERSION;  Surgeon: Geralynn Rile, MD;  Location: [place];  Service: Cardiovascular;  Laterality: N/A;   CARDIOVERSION N/A 02/26/2020   Procedure: CARDIOVERSION;  Surgeon: [name], MD;  Location: [facility] ENDOSCOPY;  Service: Cardiovascular;  Laterality: N/A;   CARDIOVERSION N/A 04/12/2020   Procedure: CARDIOVERSION;  Surgeon: Skeet Latch, MD;  Location: [facility] ENDOSCOPY;  Service: Cardiovascular;  Laterality: N/A;   CHOLECYSTECTOMY N/A 12/07/2013   Procedure: LAPAROSCOPIC CHOLECYSTECTOMY ;  Surgeon: [name], MD;  Location: [facility] OR;  Service: General;  Laterality: N/A;   COLONOSCOPY     CORONARY ARTERY BYPASS GRAFT  2004   [place] N/A 05/21/2017   Procedure: Coronary Stent Intervention;  Surgeon: [name], MD;  Location: [place] CV LAB;  Service: Cardiovascular;  Laterality: N/A;   ENDARTERECTOMY Right 12/10/2015   Procedure: Right Carotid ENDARTERECTOMY;  Surgeon: Conrad Vesta, MD;  Location: [place];  Service: Vascular;  Laterality: Right;   EXPLORATION POST OPERATIVE OPEN HEART     FINGER SURGERY  skin graft on rt index   FINGER SURGERY Right    right index finger.   HERNIA REPAIR Right    RIH   JOINT REPLACEMENT     KNEE SURGERY     replacement on both knees   LEFT HEART CATH AND CORS/GRAFTS ANGIOGRAPHY N/A 05/21/2017   Procedure: Left Heart Cath and Cors/Grafts Angiography;  Surgeon: Tonny Bollman, MD;  Location: [facility] INVASIVE CV LAB;  Service: Cardiovascular;  Laterality: N/A;   LEFT HEART CATH AND CORS/GRAFTS ANGIOGRAPHY N/A 05/20/2020   Procedure: LEFT HEART CATH AND CORS/GRAFTS ANGIOGRAPHY;  Surgeon: Tonny Bollman, MD;  Location: [facility]  INVASIVE CV LAB;  Service: Cardiovascular;  Laterality: N/A;   PACEMAKER IMPLANT N/A 03/14/2020   Procedure: PACEMAKER IMPLANT;  Surgeon: Marinus Maw, MD;  Location: [facility] INVASIVE CV LAB;  Service: Cardiovascular;  Laterality: N/A;   PROSTATE SURGERY     PVC ABLATION N/A 10/27/2017   Procedure: PVC ABLATION;  Surgeon: Marinus Maw, MD;  Location: [facility] INVASIVE CV LAB;  Service: Cardiovascular;  Laterality: N/A;   TONSILLECTOMY     URINARY SURGERY     scar tissue    Current Medications: Current Meds  Medication Sig   apixaban (ELIQUIS) 2.5 MG TABS tablet Take 1 tablet (2.5 mg total) by mouth 2 (two) times daily. (0800 & 2000)   aspirin EC 81 MG tablet Take 1 tablet (81 mg total) by mouth daily.   atorvastatin (LIPITOR) 40 MG tablet Take 40 mg by mouth at bedtime.    cholecalciferol (VITAMIN D3) 25 MCG (1000 UNIT) tablet Take 1,000 Units by mouth daily.   glucose blood test strip Accu-Check Guide Strips. Dx E11.9 Use to test blood glucose level 1 time a day.   hydrALAZINE (APRESOLINE) 10 MG tablet Take 10 mg by mouth 3 (three) times daily as needed (elevated bp greater than 160).   Lancets MISC Use as directed twice per day E11.9   magnesium oxide (MAG-OX) 400 MG tablet Take 800 mg by mouth 2 (two) times daily.    nitroGLYCERIN (NITROSTAT) 0.4 MG SL tablet Place 1 tablet (0.4 mg total) under the tongue every 5 (five) minutes as needed for chest pain (x 3 doses).   pantoprazole (PROTONIX) 40 MG tablet Take 1 tablet (40 mg total) by mouth daily.   terazosin (HYTRIN) 1 MG capsule Take 1 mg by mouth at bedtime.    vitamin C (ASCORBIC ACID) 500 MG tablet Take 500 mg by mouth daily with breakfast.    Zinc 22.5 MG TABS Take 22.5 mg by mouth every other day.   [DISCONTINUED] amiodarone (PACERONE) 200 MG tablet Take 1 tablet (200 mg total) by mouth daily.   [DISCONTINUED] Cholecalciferol (VITAMIN D-3) 25 MCG (1000 UT) CAPS Take 1,000 Units by mouth at bedtime.     Allergies:    Lisinopril, Losartan, Nifedipine, Triamterene, Hydralazine hcl, Losartan potassium-hctz, Amoxicillin, Ampicillin, and Carvedilol   Social History   Socioeconomic History   Marital status: Married    Spouse name: Not on file   Number of children: 2   Years of education: Not on file   Highest education level: Not on file  Occupational History   Occupation: retired  Tobacco Use   Smoking status: Former Smoker    Packs/day: 1.00    Years: 13.00    Pack years: 13.00    Quit date: 11/30/1970    Years since quitting: 49.5   Smokeless tobacco: Never Used  Vaping Use   Vaping Use: Never used  Substance  and Sexual Activity   Alcohol use: No    Alcohol/week: 0.0 standard drinks   Drug use: No   Sexual activity: Yes  Other Topics Concern   Not on file  Social History Narrative   Not on file   Social Determinants of Health   Financial Resource Strain:    Difficulty of Paying Living Expenses:   Food Insecurity:    Worried About Charity fundraiser in the Last Year:    Arboriculturist in the Last Year:   Transportation Needs:    Film/video editor (Medical):    Lack of Transportation (Non-Medical):   Physical Activity: Sufficiently Active   Days of Exercise per Week: 4 days   Minutes of Exercise per Session: 40 min  Stress:    Feeling of Stress :   Social Connections:    Frequency of Communication with Friends and Family:    Frequency of Social Gatherings with Friends and Family:    Attends Religious Services:    Active Member of Clubs or Organizations:    Attends Archivist Meetings:    Marital Status:      Family History: The patient's family history includes Cancer in his sister; Diabetes in his brother and sister; Heart disease in his brother, father, and mother; Hyperlipidemia in his brother and father; Hypertension in his brother, father, and sister; Stroke in his mother. There is no history of Early death.  ROS:   Please see  the history of present illness.    All other systems reviewed and are negative.  EKGs/Labs/Other Studies Reviewed:    The following studies were reviewed today: Cardiac Cath 05/20/2020: Conclusion  1.  Severe native vessel coronary artery disease with total occlusion of the left main and total occlusion of the RCA 2.  Status post aortocoronary bypass surgery with continued patency of the RIMA to PDA, LIMA to LAD, saphenous vein graft to diagonal, and saphenous vein graft OM  Recommendations: Ongoing medical therapy.  No targets for intervention.  Note there is a moderately severe stenosis at the distal anastomosis of the saphenous vein graft OM, likely a mechanical issue that has been present since the time of surgery.  This is unchanged over time and there is brisk TIMI-3 flow, very unlikely to be related to the patient's symptoms.  Diagnostic Dominance: Right Left Main  Ost LM to LM lesion is 100% stenosed. The left main is critically stenosed with minimal flow beyond the area of severe stenosis.  Left Circumflex  First Obtuse Marginal Branch  1st Mrg lesion is 60% stenosed.  Right Coronary Artery  Mid RCA to Dist RCA lesion is 100% stenosed.  RIMA RIMA Graft To RPDA  RIMA. The RIMA to PDA graft is patent. This is imaged from right radial access because of known severe tortuosity on the previous study. The graft is patent with no stenosis. The anastomosis is somewhat difficult to visualize but appears widely patent with no stenosis. The PDA and PLA branches fill from the graft.  Saphenous Graft To Ramus  SVG. The SVG to ramus intermedius has a critical degenerated 95% eccentric lesion with TIMI 2 flow beyond the lesion. The lesion is located in the proximal body of the graft.  Previously placed Prox Graft drug eluting stent is widely patent. The lesion is eccentric. An AL-1 guide catheter is used. Angiomax was used for coagulation. The right femoral sheath was upsized to a 6 [place]. A  therapeutic ACT is achieved. The lesion  is crossed with cougar wire. Verapamil was administered through the guide once the bypass graft is selectively engaged. The lesion is predilated with a 2.5 mm balloon. Verapamil was administered again. A 5 mm spider distal embolic protection device was prepped and advanced beyond the lesion. The lesion is then stented with a 4.0 x 16 mm Promus DES deployed at 14 atm. The spider embolic protection device is removed and final angiography confirmed 0% residual stenosis and TIMI-3 flow.  Saphenous Graft To 1st Mrg  SVG. The SVG to OM is patent with diffuse irregularity. The OM branch where the vessel is tented up has an unusual appearance just beyond the distal anastomosis. There is at least 50-70% stenosis in that region. This is unchanged from the previous study.  LIMA LIMA Graft To Mid LAD  LIMA. The LIMA to mid LAD is widely patent with no stenosis. The LAD beyond the LIMA insertion site is patent. This is unchanged from the previous study.  Saphenous Graft To 1st Diag  SVG. SVG to diagonal is widely patent. The stented segment in the proximal body of the graft is widely patent.  Prox Graft to Mid Graft lesion is 20% stenosed. The lesion was previously treated using a drug eluting stent over 2 years ago.  Intervention  No interventions have been documented. Coronary Diagrams  Diagnostic Dominance: Right   EKG:  EKG is not ordered today.    Recent Labs: 01/25/2020: ALT 15; TSH 3.885 04/24/2020: Magnesium 2.3 05/16/2020: BUN 29; Creatinine, Ser 2.15; Hemoglobin 10.0; Platelets 139; Potassium 4.4; Sodium 138  Recent Lipid Panel    Component Value Date/Time   CHOL 114 11/21/2019 0610   TRIG 69 11/21/2019 0610   HDL 38 (L) 11/21/2019 0610   CHOLHDL 3.0 11/21/2019 0610   VLDL 14 11/21/2019 0610   LDLCALC 62 11/21/2019 0610   LDLDIRECT 67.0 09/16/2016 1455    Physical Exam:    VS:  BP (!) 152/88    Pulse 70    Ht '6\' 2"'$  (1.88 m)    Wt 218 lb 9.6 oz  (99.2 kg)    SpO2 97%    BMI 28.07 kg/m     Wt Readings from Last 3 Encounters:  06/10/20 218 lb 9.6 oz (99.2 kg)  05/20/20 209 lb (94.8 kg)  04/24/20 217 lb (98.4 kg)     GEN:  Well nourished, well developed elderly man in no acute distress HEENT: Normal NECK: No JVD; No carotid bruits LYMPHATICS: No lymphadenopathy CARDIAC: RRR, 2/6 early peaking systolic ejection murmur at the right upper sternal border, no diastolic murmur RESPIRATORY:  Clear to auscultation without rales, wheezing or rhonchi  ABDOMEN: Soft, non-tender, non-distended MUSCULOSKELETAL:  No edema; No deformity  SKIN: Warm and dry NEUROLOGIC:  Alert and oriented x 3 PSYCHIATRIC:  Normal affect   ASSESSMENT:    1. Persistent atrial fibrillation ([place])   2. Coronary artery disease involving native coronary artery of native heart with angina pectoris ([place])   3. CKD (chronic kidney disease) stage 4, GFR 15-29 ml/min (HCC)   4. Mixed hyperlipidemia   5. Complete heart block (HCC)    PLAN:    In order of problems listed above:  1. The patient now has persistent atrial fibrillation on amiodarone after cardioversion.  I think the risk benefit of continuing amiodarone is unfavorable since he has now failed cardioversion.  We will stop this and recommend starting him back on metoprolol tartrate 12.5 mg twice daily.  If he tolerates this reasonably well,  would increase to 25 mg twice daily at follow-up.  He continues on dose reduced apixaban for anticoagulation in the setting of advanced age and stage IV chronic kidney disease. 2. Recent catheterization results reviewed.  Continued patency of his bypass grafts.  Continues on aspirin 81 mg daily.  Treated with a high intensity statin drug. 3. Creatinine has been stable, most recently at 2.15 mg/dL.  He is allergic to ACE inhibitors and ARB use.  He follows routinely with nephrology. 4. Treated with atorvastatin 40 mg daily. 5. Status post permanent pacemaker placement,  followed by [name].   Medication Adjustments/Labs and Tests Ordered: Current medicines are reviewed at length with the patient today.  Concerns regarding medicines are outlined above.  No orders of the defined types were placed in this encounter.  Meds ordered this encounter  Medications   metoprolol tartrate (LOPRESSOR) 25 MG tablet    Sig: Take 0.5 tablets (12.5 mg total) by mouth 2 (two) times daily.    Dispense:  90 tablet    Refill:  3    Patient Instructions  Medication Instructions:  1) STOP AMIODARONE 2) START METOPROLOL 12.5 mg twice daily *If you need a refill on your cardiac medications before your next appointment, please call your pharmacy*  Follow-Up: At [facility], you and your health needs are our priority.  As part of our continuing mission to provide you with exceptional heart care, we have created designated Provider Care Teams.  These Care Teams include your primary Cardiologist (physician) and Advanced Practice Providers (APPs -  Physician Assistants and Nurse Practitioners) who all work together to provide you with the care you need, when you need it. Your next appointment:   6 month(s) The format for your next appointment:   In Person Provider:   You may see [name], MD or one of the following Advanced Practice Providers on your designated Care Team:    Richardson Dopp, PA-C  Robbie Lis, [place]      Signed, [name], MD  06/11/2020 3:26 PM    [place]

## 2020-06-17 ENCOUNTER — Ambulatory Visit (INDEPENDENT_AMBULATORY_CARE_PROVIDER_SITE_OTHER): Payer: Medicare Other | Admitting: *Deleted

## 2020-06-17 ENCOUNTER — Telehealth: Payer: Self-pay | Admitting: Cardiovascular Disease

## 2020-06-17 DIAGNOSIS — I495 Sick sinus syndrome: Secondary | ICD-10-CM

## 2020-06-17 NOTE — Telephone Encounter (Signed)
   Pt c/o BP issue: STAT if pt c/o blurred vision, one-sided weakness or slurred speech  1. What are your last 5 BP readings? 188/102 HR 63, 155/87 HR 67  2. Are you having any other symptoms (ex. Dizziness, headache, blurred vision, passed out)? Denied other symptoms   3. What is your BP issue? Pt said when he met Dr. Burt Knack he made him stopped is other heart meds and gave   metoprolol tartrate (LOPRESSOR) 25 MG tablet  Instead, she said since then his BP kept fluctuate and it is concerning to him because his BP will go up to 188/102. He said he doesn't have any symptoms just his elevated BP

## 2020-06-17 NOTE — Telephone Encounter (Addendum)
Pt did report generalized fatigue and exertional dyspnea on his 06/10/20 visit with Dr Burt Knack as well. Ideally prefer pt to be on beta blocker therapy due to history of CAD and permanent afib after cardioversion. BP was 152/88, HR 70 at that visit.  Recommend stopping metoprolol and starting carvedilol 3.125mg  BID to see if pt tolerates alternative beta blocker therapy better. Carvedilol will also help to lower his BP more due effects on both alpha and beta receptors. He should keep a log of home BP and HR and can send results to office in 1 week.

## 2020-06-17 NOTE — Telephone Encounter (Signed)
Thomas Marshall reports two days ago, his BP was 188/"something" and he had a dull headache and "just didn't feel right." He does not remember if this reading was taken before or after taking his medications. He did not take his PRN hydralazine (he is a rx for 10 mg TID for systolic BP >923).  Since Saturday and usually, his blood pressure range is 130s-160s/80s. While on the phone, he checked his BP and it was 154/95. HR 60. He was instructed to take hydralazine 10 mg.  The patient was seen 7/12. At that visit, his amiodarone was stopped and metoprolol 12.5 mg BID was initiated. He states he just "hasn't felt great" since starting metoprolol and has noticed increased SOB and fatigue. He requests to change that medication altogether if possible.  To [facility] for recommendations.

## 2020-06-18 LAB — CUP PACEART REMOTE DEVICE CHECK
Date Time Interrogation Session: 20210720063108
Implantable Lead Implant Date: 20210415
Implantable Lead Implant Date: 20210415
Implantable Lead Location: 753859
Implantable Lead Location: 753860
Implantable Pulse Generator Implant Date: 20210415
Pulse Gen Model: 2272
Pulse Gen Serial Number: 3813712

## 2020-06-18 MED ORDER — CARVEDILOL 3.125 MG PO TABS
3.1250 mg | ORAL_TABLET | Freq: Two times a day (BID) | ORAL | 3 refills | Status: DC
Start: 2020-06-18 — End: 2021-06-12

## 2020-06-18 NOTE — Telephone Encounter (Signed)
The patient states his BP this AM is 154/87. Instructed him to STOP METOPROLOL and START CARVEDILOL 3.125 mg BID. He will keep a BP/HR log and keep in touch. He has a visit with [name] next week as well. He was grateful for assistance.

## 2020-06-19 NOTE — Progress Notes (Signed)
Remote pacemaker transmission.   

## 2020-06-25 ENCOUNTER — Encounter: Payer: Self-pay | Admitting: *Deleted

## 2020-06-25 ENCOUNTER — Encounter: Payer: Self-pay | Admitting: Internal Medicine

## 2020-06-25 ENCOUNTER — Other Ambulatory Visit: Payer: Self-pay

## 2020-06-25 ENCOUNTER — Ambulatory Visit (INDEPENDENT_AMBULATORY_CARE_PROVIDER_SITE_OTHER): Payer: Medicare Other | Admitting: Internal Medicine

## 2020-06-25 VITALS — BP 150/78 | HR 70 | Ht 74.0 in | Wt 220.0 lb

## 2020-06-25 DIAGNOSIS — I4819 Other persistent atrial fibrillation: Secondary | ICD-10-CM | POA: Diagnosis not present

## 2020-06-25 DIAGNOSIS — I6523 Occlusion and stenosis of bilateral carotid arteries: Secondary | ICD-10-CM | POA: Diagnosis not present

## 2020-06-25 DIAGNOSIS — I495 Sick sinus syndrome: Secondary | ICD-10-CM

## 2020-06-25 DIAGNOSIS — I442 Atrioventricular block, complete: Secondary | ICD-10-CM

## 2020-06-25 DIAGNOSIS — I493 Ventricular premature depolarization: Secondary | ICD-10-CM

## 2020-06-25 NOTE — Progress Notes (Signed)
HPI [name] returns today for ongoing evaluation and management of his atrial fib, chronic systolic heart failure, PVC's s/p ablation, and symptomatic sinus node dysfunction s/p PPM insertion. He has been relegated to a strategy of rate control. He initially had some increased ventricular rates in atrial fib but lately his rates have been under better control. He was unable to maintain NSR after amiodarone and a DCCV. He has been on dofetilide in the past which also became ineffective. Allergies  Allergen Reactions  . Lisinopril Swelling and Other (See Comments)    Angioedema.   . Losartan Swelling and Other (See Comments)    Lips swell Angioedema   . Nifedipine Swelling and Other (See Comments)    Sugar increase  . Triamterene Swelling and Other (See Comments)    Face swells, no breathing impairment  . Hydralazine Hcl Other (See Comments)    Fatigue; poor appetite  . Losartan Potassium-Hctz Swelling and Other (See Comments)  . Amoxicillin Rash and Other (See Comments)    Did it involve swelling of the face/tongue/throat, SOB, or low BP? No Did it involve sudden or severe rash/hives, skin peeling, or any reaction on the inside of your mouth or nose? No Did you need to seek medical attention at a hospital or doctor's office? No When did it last happen?10 + years If all above answers are "NO", may proceed with cephalosporin use.    . Ampicillin Rash and Other (See Comments)    Did it involve swelling of the face/tongue/throat, SOB, or low BP? No Did it involve sudden or severe rash/hives, skin peeling, or any reaction on the inside of your mouth or nose? No Did you need to seek medical attention at a hospital or doctor's office? No When did it last happen?10 + years If all above answers are "NO", may proceed with cephalosporin use.   . Carvedilol Other (See Comments)    Low heart rate      Current Outpatient Medications  Medication Sig Dispense Refill  .  apixaban (ELIQUIS) 2.5 MG TABS tablet Take 1 tablet (2.5 mg total) by mouth 2 (two) times daily. (0800 & 2000) 180 tablet 3  . aspirin EC 81 MG tablet Take 1 tablet (81 mg total) by mouth daily. 90 tablet 3  . atorvastatin (LIPITOR) 40 MG tablet Take 40 mg by mouth at bedtime.     . carvedilol (COREG) 3.125 MG tablet Take 1 tablet (3.125 mg total) by mouth 2 (two) times daily. 180 tablet 3  . cholecalciferol (VITAMIN D3) 25 MCG (1000 UNIT) tablet Take 1,000 Units by mouth daily.    [name] glucose blood test strip Accu-Check Guide Strips. Dx E11.9 Use to test blood glucose level 1 time a day. 100 each 11  . hydrALAZINE (APRESOLINE) 10 MG tablet Take 10 mg by mouth 3 (three) times daily as needed (elevated bp greater than 160).    . Lancets MISC Use as directed twice per day E11.9 200 each 3  . magnesium oxide (MAG-OX) 400 MG tablet Take 800 mg by mouth 2 (two) times daily.     . nitroGLYCERIN (NITROSTAT) 0.4 MG SL tablet Place 1 tablet (0.4 mg total) under the tongue every 5 (five) minutes as needed for chest pain (x 3 doses). 30 tablet 3  . pantoprazole (PROTONIX) 40 MG tablet Take 1 tablet (40 mg total) by mouth daily. 90 tablet 3  . terazosin (HYTRIN) 1 MG capsule Take 1 mg by mouth at bedtime.  30 capsule 11  . vitamin C (ASCORBIC ACID) 500 MG tablet Take 500 mg by mouth daily with breakfast.     . Zinc 22.5 MG TABS Take 22.5 mg by mouth every other day.     No current facility-administered medications for this visit.     Past Medical History:  Diagnosis Date  . AAA (abdominal aortic aneurysm) ([place])   . Arthritis   . Cancer (HCC)    skin & squamous cell  . Carotid artery disease ([place])    a. s/p R CEA 12/2015.  [name] CKD (chronic kidney disease), stage III    a. per historical labs.  . Coronary artery disease    a. s/p CABG 2004.  . Diabetes mellitus without complication ([place])   . GERD (gastroesophageal reflux disease)   . Hyperlipidemia   . Hypertension   . Medication intolerance Multiple    . PAF (paroxysmal atrial fibrillation) ([place])   . Pneumonia   . Sinus bradycardia    a. baseline HR 50s-60s, also h/o bradycardia on metoprolol and carvedilol    ROS:   All systems reviewed and negative except as noted in the HPI.   Past Surgical History:  Procedure Laterality Date  . ANKLE SURGERY     right fused  . BACK SURGERY     low back   . CARDIOVERSION N/A 02/14/2020   Procedure: CARDIOVERSION;  Surgeon: Thomas Rile, MD;  Location: [place];  Service: Cardiovascular;  Laterality: N/A;  . CARDIOVERSION N/A 02/26/2020   Procedure: CARDIOVERSION;  Surgeon: Thomas Casino, MD;  Location[facility] ENDOSCOPY;  Service: Cardiovascular;  Laterality: N/A;  . CARDIOVERSION N/A 04/12/2020   Procedure: CARDIOVERSION;  Surgeon: Thomas Latch, MD;  Location: [facility] ENDOSCOPY;  Service: Cardiovascular;  Laterality: N/A;  . CHOLECYSTECTOMY N/A 12/07/2013   Procedure: LAPAROSCOPIC CHOLECYSTECTOMY ;  Surgeon: Thomas Bookbinder, MD;  Location: [place];  Service: General;  Laterality: N/A;  . COLONOSCOPY    . CORONARY ARTERY BYPASS GRAFT  2004   [place]  . CORONARY STENT INTERVENTION N/A 05/21/2017   Procedure: Coronary Stent Intervention;  Surgeon: Thomas Mocha, MD;  Location: [place] CV LAB;  Service: Cardiovascular;  Laterality: N/A;  . ENDARTERECTOMY Right 12/10/2015   Procedure: Right Carotid ENDARTERECTOMY;  Surgeon: Thomas [place], MD;  Location: [place];  Service: Vascular;  Laterality: Right;  . EXPLORATION POST OPERATIVE OPEN HEART    . FINGER SURGERY     skin graft on rt index  . FINGER SURGERY Right    right index finger.  [name] HERNIA REPAIR Right    RIH  . JOINT REPLACEMENT    . KNEE SURGERY     replacement on both knees  . LEFT HEART CATH AND CORS/GRAFTS ANGIOGRAPHY N/A 05/21/2017   Procedure: Left Heart Cath and Cors/Grafts Angiography;  Surgeon: Thomas Mocha, MD;  Location: [place] CV LAB;  Service: Cardiovascular;  Laterality: N/A;  . LEFT HEART CATH AND  CORS/GRAFTS ANGIOGRAPHY N/A 05/20/2020   Procedure: LEFT HEART CATH AND CORS/GRAFTS ANGIOGRAPHY;  Surgeon: Thomas Mocha, MD;  Location: [place] CV LAB;  Service: Cardiovascular;  Laterality: N/A;  . PACEMAKER IMPLANT N/A 03/14/2020   Procedure: PACEMAKER IMPLANT;  Surgeon: Evans Lance, MD;  Location: [place] CV LAB;  Service: Cardiovascular;  Laterality: N/A;  . PROSTATE SURGERY    . PVC ABLATION N/A 10/27/2017   Procedure: PVC ABLATION;  Surgeon: Evans Lance, MD;  Location: [place] CV LAB;  Service: Cardiovascular;  Laterality: N/A;  .  TONSILLECTOMY    . URINARY SURGERY     scar tissue     Family History  Problem Relation Age of Onset  . Stroke Mother   . Heart disease Mother   . Heart disease Father   . Hypertension Father   . Hyperlipidemia Father   . Cancer Sister        breast  and skin  . Diabetes Sister   . Hypertension Sister   . Diabetes Brother   . Heart disease Brother   . Hyperlipidemia Brother   . Hypertension Brother   . Early death Neg Hx      Social History   Socioeconomic History  . Marital status: Married    Spouse name: Not on file  . Number of children: 2  . Years of education: Not on file  . Highest education level: Not on file  Occupational History  . Occupation: retired  Tobacco Use  . Smoking status: Former Smoker    Packs/day: 1.00    Years: 13.00    Pack years: 13.00    Quit date: 11/30/1970    Years since quitting: 49.6  . Smokeless tobacco: Never Used  Vaping Use  . Vaping Use: Never used  Substance and Sexual Activity  . Alcohol use: No    Alcohol/week: 0.0 standard drinks  . Drug use: No  . Sexual activity: Yes  Other Topics Concern  . Not on file  Social History Narrative  . Not on file   Social Determinants of Health   Financial Resource Strain:   . Difficulty of Paying Living Expenses:   Food Insecurity:   . Worried About Charity fundraiser in the Last Year:   . Arboriculturist in the Last Year:     Transportation Needs:   . Film/video editor (Medical):   [name] Lack of Transportation (Non-Medical):   Physical Activity: Sufficiently Active  . Days of Exercise per Week: 4 days  . Minutes of Exercise per Session: 40 min  Stress:   . Feeling of Stress :   Social Connections:   . Frequency of Communication with Friends and Family:   . Frequency of Social Gatherings with Friends and Family:   . Attends Religious Services:   . Active Member of Clubs or Organizations:   . Attends Archivist Meetings:   [name] Marital Status:   Intimate Partner Violence:   . Fear of Current or Ex-Partner:   . Emotionally Abused:   [name] Physically Abused:   . Sexually Abused:      BP (!) 150/78   Pulse 70   Ht 6\' 2"  (1.88 m)   Wt (!) 220 lb (99.8 kg)   SpO2 98%   BMI 28.25 kg/m   Physical Exam:  Well appearing NAD HEENT: Unremarkable Neck:  No JVD, no thyromegally Lymphatics:  No adenopathy Back:  No CVA tenderness Lungs:  Clear with no wheezes HEART:  IRegular rate rhythm, no murmurs, no rubs, no clicks Abd:  soft, positive bowel sounds, no organomegally, no rebound, no guarding Ext:  2 plus pulses, no edema, no cyanosis, no clubbing Skin:  No rashes no nodules Neuro:  CN II through XII intact, motor grossly intact  EKG - atrial fib with a controlled VR and RBBB  DEVICE  Normal device function.  See PaceArt for details.   Assess/Plan: 1. Persistent atrial fib - his VR is well controlled. No change in AV nodal blocking drugs. He could not tolerate toprol  but is tolerating low dose coreg. 2. Chronic diastolic heart failure - his symptoms are mostly class 2. He notes that he has some good and some bad days.  3. CAD - he has undergone left heart cath but no intervention was required. He has occluded LM and RCA and patent grafts to PDA/LAD/diagonal and OM.  4. PPM - his St. Jude DDD PM is working normally. He is pacing about 75% in the ventricle.  Mikle Bosworth.D.

## 2020-06-25 NOTE — Patient Instructions (Signed)
Medication Instructions:  Your physician recommends that you continue on your current medications as directed. Please refer to the Current Medication list given to you today.  *If you need a refill on your cardiac medications before your next appointment, please call your pharmacy*  Lab Work: None ordered.  If you have labs (blood work) drawn today and your tests are completely normal, you will receive your results only by: [name] MyChart Message (if you have MyChart) OR . A paper copy in the mail If you have any lab test that is abnormal or we need to change your treatment, we will call you to review the results.  Testing/Procedures: None ordered.  Follow-Up: At [facility], you and your health needs are our priority.  As part of our continuing mission to provide you with exceptional heart care, we have created designated Provider Care Teams.  These Care Teams include your primary Cardiologist (physician) and Advanced Practice Providers (APPs -  Physician Assistants and Nurse Practitioners) who all work together to provide you with the care you need, when you need it.  We recommend signing up for the patient portal called "MyChart".  Sign up information is provided on this After Visit Summary.  MyChart is used to connect with patients for Virtual Visits (Telemedicine).  Patients are able to view lab/test results, encounter notes, upcoming appointments, etc.  Non-urgent messages can be sent to your provider as well.   To learn more about what you can do with MyChart, go to [email or web address].    Your next appointment:   Your physician wants you to follow-up in: 6 months with [name]. You will receive a reminder letter in the mail two months in advance. If you don't receive a letter, please call our office to schedule the follow-up appointment.  Remote monitoring is used to monitor your Pacemaker from home. This monitoring reduces the number of office visits required to check your  device to one time per year. It allows [place] to keep an eye on the functioning of your device to ensure it is working properly. You are scheduled for a device check from home on 07/17/2020. You may send your transmission at any time that day. If you have a wireless device, the transmission will be sent automatically. After your physician reviews your transmission, you will receive a postcard with your next transmission date.  Other Instructions:

## 2020-06-26 NOTE — Addendum Note (Signed)
Addended by: Rose Phi on: 06/26/2020 05:33 PM   Modules accepted: Orders

## 2020-07-04 LAB — CUP PACEART INCLINIC DEVICE CHECK
Date Time Interrogation Session: 20210727161116
Implantable Lead Implant Date: 20210415
Implantable Lead Implant Date: 20210415
Implantable Lead Location: 753859
Implantable Lead Location: 753860
Implantable Pulse Generator Implant Date: 20210415
Lead Channel Pacing Threshold Amplitude: 0.5 V
Lead Channel Pacing Threshold Pulse Width: 0.5 ms
Lead Channel Sensing Intrinsic Amplitude: 12 mV
Pulse Gen Model: 2272
Pulse Gen Serial Number: 3813712

## 2020-07-19 DIAGNOSIS — L578 Other skin changes due to chronic exposure to nonionizing radiation: Secondary | ICD-10-CM | POA: Diagnosis not present

## 2020-07-19 DIAGNOSIS — L821 Other seborrheic keratosis: Secondary | ICD-10-CM | POA: Diagnosis not present

## 2020-07-19 DIAGNOSIS — L570 Actinic keratosis: Secondary | ICD-10-CM | POA: Diagnosis not present

## 2020-07-22 DIAGNOSIS — E785 Hyperlipidemia, unspecified: Secondary | ICD-10-CM | POA: Diagnosis not present

## 2020-07-22 DIAGNOSIS — N189 Chronic kidney disease, unspecified: Secondary | ICD-10-CM | POA: Diagnosis not present

## 2020-07-22 DIAGNOSIS — N179 Acute kidney failure, unspecified: Secondary | ICD-10-CM | POA: Diagnosis not present

## 2020-07-22 DIAGNOSIS — R809 Proteinuria, unspecified: Secondary | ICD-10-CM | POA: Diagnosis not present

## 2020-07-22 DIAGNOSIS — D631 Anemia in chronic kidney disease: Secondary | ICD-10-CM | POA: Diagnosis not present

## 2020-07-22 DIAGNOSIS — E1122 Type 2 diabetes mellitus with diabetic chronic kidney disease: Secondary | ICD-10-CM | POA: Diagnosis not present

## 2020-07-22 DIAGNOSIS — I129 Hypertensive chronic kidney disease with stage 1 through stage 4 chronic kidney disease, or unspecified chronic kidney disease: Secondary | ICD-10-CM | POA: Diagnosis not present

## 2020-07-22 DIAGNOSIS — N1832 Chronic kidney disease, stage 3b: Secondary | ICD-10-CM | POA: Diagnosis not present

## 2020-07-31 ENCOUNTER — Other Ambulatory Visit: Payer: Self-pay

## 2020-07-31 ENCOUNTER — Ambulatory Visit (INDEPENDENT_AMBULATORY_CARE_PROVIDER_SITE_OTHER): Payer: Medicare Other | Admitting: Podiatry

## 2020-07-31 ENCOUNTER — Encounter: Payer: Self-pay | Admitting: Podiatry

## 2020-07-31 DIAGNOSIS — Q828 Other specified congenital malformations of skin: Secondary | ICD-10-CM | POA: Diagnosis not present

## 2020-07-31 DIAGNOSIS — D689 Coagulation defect, unspecified: Secondary | ICD-10-CM

## 2020-07-31 DIAGNOSIS — B351 Tinea unguium: Secondary | ICD-10-CM

## 2020-07-31 DIAGNOSIS — M79675 Pain in left toe(s): Secondary | ICD-10-CM | POA: Diagnosis not present

## 2020-07-31 DIAGNOSIS — E1140 Type 2 diabetes mellitus with diabetic neuropathy, unspecified: Secondary | ICD-10-CM

## 2020-07-31 DIAGNOSIS — M79674 Pain in right toe(s): Secondary | ICD-10-CM

## 2020-07-31 NOTE — Progress Notes (Signed)
This patient returns to my office for at risk foot care.  This patient requires this care by a professional since this patient will be at risk due to having coagulation defect due to eliquis.  This patient is unable to cut nails himself since the patient cannot reach his nails.These nails are painful walking and wearing shoes.  This patient presents for at risk foot care today.  General Appearance  Alert, conversant and in no acute stress.  Vascular  Dorsalis pedis and posterior tibial  pulses are weakly  palpable  bilaterally.  Capillary return is within normal limits  bilaterally. Temperature is within normal limits  bilaterally.  Neurologic  Senn-Weinstein monofilament wire test diminished   bilaterally. Muscle power within normal limits bilaterally.  Nails Thick disfigured discolored nails with subungual debris  from hallux to fifth toes bilaterally. No evidence of bacterial infection or drainage bilaterally.  Orthopedic  No limitations of motion  feet .  No crepitus or effusions noted.  No bony pathology or digital deformities noted.  Bony prominence sub 5th metabase right.   Skin  normotropic skin with no porokeratosis noted bilaterally.  No signs of infections or ulcers noted.     Onychomycosis  Pain in right toes  Pain in left toes  Porokeratosis sub 5th MPJ right and sub 5th metabase right.  Consent was obtained for treatment procedures.   Mechanical debridement of nails 1-5  bilaterally performed with a nail nipper.  Filed with dremel without incident. Debridement of callus/porokeratosis with # 15 blade.   Return office visit   3 months                   Told patient to return for periodic foot care and evaluation due to potential at risk complications.   Gardiner Barefoot DPM

## 2020-08-08 ENCOUNTER — Other Ambulatory Visit: Payer: Self-pay

## 2020-08-08 ENCOUNTER — Ambulatory Visit (INDEPENDENT_AMBULATORY_CARE_PROVIDER_SITE_OTHER): Payer: Medicare Other | Admitting: Podiatry

## 2020-08-08 DIAGNOSIS — E1140 Type 2 diabetes mellitus with diabetic neuropathy, unspecified: Secondary | ICD-10-CM

## 2020-08-08 DIAGNOSIS — I6523 Occlusion and stenosis of bilateral carotid arteries: Secondary | ICD-10-CM

## 2020-08-08 DIAGNOSIS — L84 Corns and callosities: Secondary | ICD-10-CM | POA: Diagnosis not present

## 2020-08-08 MED ORDER — MUPIROCIN 2 % EX OINT
1.0000 | TOPICAL_OINTMENT | Freq: Two times a day (BID) | CUTANEOUS | 2 refills | Status: DC
Start: 2020-08-08 — End: 2021-12-23

## 2020-08-15 NOTE — Progress Notes (Signed)
Subjective: 79 year old male presents the office today for concerns of a possible wound in the right foot.  States he previously callus trimmed and afterwards he states the area has been tender and looks preulcerative.  Denies any drainage or pus any swelling or redness.  He also brings his diabetic inserts and as there is not an offloading area for the callus on the right foot as the old inserts cleared. Denies any systemic complaints such as fevers, chills, nausea, vomiting. No acute changes since last appointment, and no other complaints at this time.   Objective: AAO x3, NAD DP/PT pulses palpable bilaterally, CRT less than 3 seconds On the right foot fifth metatarsal base there is mild hyperkeratotic tissue but it is pink healthy skin present appears to be preulcerative there is no definitive skin breakdown.  There is no edema, erythema and signs of infection. No pain with calf compression, swelling, warmth, erythema  Assessment: Preulcerative callus right foot  Plan: -All treatment options discussed with the patient including all alternatives, risks, complications.  -I cannot trim any further hyperkeratotic tissue today although is minimal today.  Recommend moisturizer daily and offloading.  Monitoring skin breakdown.  I had Rick our ped orthotist modify his diabetic inserts to help offload. -Discussed daily foot inspection. -Patient encouraged to call the office with any questions, concerns, change in symptoms.   [name] DPM

## 2020-08-27 ENCOUNTER — Ambulatory Visit (INDEPENDENT_AMBULATORY_CARE_PROVIDER_SITE_OTHER): Payer: Medicare Other | Admitting: Podiatry

## 2020-08-27 ENCOUNTER — Ambulatory Visit: Payer: Medicare Other | Admitting: Orthotics

## 2020-08-27 ENCOUNTER — Other Ambulatory Visit: Payer: Self-pay

## 2020-08-27 DIAGNOSIS — L84 Corns and callosities: Secondary | ICD-10-CM

## 2020-08-27 DIAGNOSIS — I6523 Occlusion and stenosis of bilateral carotid arteries: Secondary | ICD-10-CM

## 2020-08-27 DIAGNOSIS — E1140 Type 2 diabetes mellitus with diabetic neuropathy, unspecified: Secondary | ICD-10-CM | POA: Diagnosis not present

## 2020-08-27 NOTE — Progress Notes (Signed)
Offload diabetc inserts  5th met head and base.

## 2020-08-28 NOTE — Progress Notes (Signed)
Subjective: 79 year old male presents the office today for follow evaluation of preulcerative calluses in the right foot.  He states that he has been feeling better.  He was keeping antibiotic ointment on it but also Vaseline.  He also presents today to have his orthotics/diabetic inserts modified as they did not have the offloading made within the orthotic.  Denies any systemic complaints such as fevers, chills, nausea, vomiting. No acute changes since last appointment, and no other complaints at this time.   Objective: AAO x3, NAD DP/PT pulses palpable bilaterally, CRT less than 3 seconds Hyperkeratotic lesions right foot fifth metatarsal as well as with metatarsal base.  Upon debridement there is no ongoing ulceration drainage or signs of infection.  There is dried blood in the callus submetatarsal 5 and upon debridement there is no ongoing ulceration drainage or signs of infection. Previous ankle fusion right side No pain with calf compression, swelling, warmth, erythema  Assessment: Preulcerative calluses right side with improvement  Plan: -All treatment options discussed with the patient including all alternatives, risks, complications.  -Debrided the preulcerative callus without any complications or bleeding.  Continue offloading.  He also pick up his new inserts today.  Discussed the importance daily foot inspection. -Patient encouraged to call the office with any questions, concerns, change in symptoms.   [name] DPM

## 2020-08-30 ENCOUNTER — Other Ambulatory Visit: Payer: Self-pay

## 2020-08-30 ENCOUNTER — Ambulatory Visit (INDEPENDENT_AMBULATORY_CARE_PROVIDER_SITE_OTHER): Payer: Medicare Other

## 2020-08-30 DIAGNOSIS — Z23 Encounter for immunization: Secondary | ICD-10-CM

## 2020-09-02 ENCOUNTER — Encounter: Payer: Self-pay | Admitting: Internal Medicine

## 2020-09-02 ENCOUNTER — Other Ambulatory Visit: Payer: Self-pay

## 2020-09-02 ENCOUNTER — Ambulatory Visit (INDEPENDENT_AMBULATORY_CARE_PROVIDER_SITE_OTHER): Payer: Medicare Other | Admitting: Internal Medicine

## 2020-09-02 VITALS — BP 122/72 | HR 82 | Temp 98.4°F | Ht 74.0 in | Wt 216.0 lb

## 2020-09-02 DIAGNOSIS — R0600 Dyspnea, unspecified: Secondary | ICD-10-CM

## 2020-09-02 DIAGNOSIS — N1832 Chronic kidney disease, stage 3b: Secondary | ICD-10-CM

## 2020-09-02 DIAGNOSIS — D649 Anemia, unspecified: Secondary | ICD-10-CM

## 2020-09-02 DIAGNOSIS — I10 Essential (primary) hypertension: Secondary | ICD-10-CM

## 2020-09-02 DIAGNOSIS — E1122 Type 2 diabetes mellitus with diabetic chronic kidney disease: Secondary | ICD-10-CM

## 2020-09-02 DIAGNOSIS — I6523 Occlusion and stenosis of bilateral carotid arteries: Secondary | ICD-10-CM | POA: Diagnosis not present

## 2020-09-02 NOTE — Progress Notes (Signed)
Subjective:    Patient ID: Thomas [facility], male    DOB: [birth year], 79 y.o.   MRN: [id number]  HPI  Here to f/u; overall doing ok,  Pt denies chest pain, wheezing, orthopnea, PND, increased LE swelling, palpitations, dizziness or syncope, but has had incresaed sob/doe recently with fatigue, and hoping I would have the recent lab work done per renal just 2 days ago.  Pt denies new neurological symptoms such as new headache, or facial or extremity weakness or numbness.  Pt denies polydipsia, polyuria, or low sugar episode.  Pt states overall good compliance with meds, mostly trying to follow appropriate diet, with wt overall stable,  but little exercise however. Past Medical History:  Diagnosis Date  . AAA (abdominal aortic aneurysm) ([place])   . Arthritis   . Cancer (HCC)    skin & squamous cell  . Carotid artery disease ([place])    a. s/p R CEA 12/2015.  [name] CKD (chronic kidney disease), stage III ([place])    a. per historical labs.  . Coronary artery disease    a. s/p CABG 2004.  . Diabetes mellitus without complication ([place])   . GERD (gastroesophageal reflux disease)   . Hyperlipidemia   . Hypertension   . Medication intolerance Multiple   . PAF (paroxysmal atrial fibrillation) ([place])   . Pneumonia   . Sinus bradycardia    a. baseline HR 50s-60s, also h/o bradycardia on metoprolol and carvedilol   Past Surgical History:  Procedure Laterality Date  . ANKLE SURGERY     right fused  . BACK SURGERY     low back   . CARDIOVERSION N/A 02/14/2020   Procedure: CARDIOVERSION;  Surgeon: Geralynn Rile, MD;  Location: [facility];  Service: Cardiovascular;  Laterality: N/A;  . CARDIOVERSION N/A 02/26/2020   Procedure: CARDIOVERSION;  Surgeon: [name], MD;  Location[facility] ENDOSCOPY;  Service: Cardiovascular;  Laterality: N/A;  . CARDIOVERSION N/A 04/12/2020   Procedure: CARDIOVERSION;  Surgeon: Skeet Latch, MD;  Location: [facility] ENDOSCOPY;  Service: Cardiovascular;  Laterality: N/A;  .  CHOLECYSTECTOMY N/A 12/07/2013   Procedure: LAPAROSCOPIC CHOLECYSTECTOMY ;  Surgeon: [name], MD;  Location: [place];  Service: General;  Laterality: N/A;  . COLONOSCOPY    . CORONARY ARTERY BYPASS GRAFT  2004   [place]  . CORONARY STENT INTERVENTION N/A 05/21/2017   Procedure: Coronary Stent Intervention;  Surgeon: [name], MD;  Location: [place] CV LAB;  Service: Cardiovascular;  Laterality: N/A;  . ENDARTERECTOMY Right 12/10/2015   Procedure: Right Carotid ENDARTERECTOMY;  Surgeon: Conrad Muscoy, MD;  Location: [facility];  Service: Vascular;  Laterality: Right;  . EXPLORATION POST OPERATIVE OPEN HEART    . FINGER SURGERY     skin graft on rt index  . FINGER SURGERY Right    right index finger.  [name] HERNIA REPAIR Right    [facility]  . JOINT REPLACEMENT    . KNEE SURGERY     replacement on both knees  . LEFT HEART CATH AND CORS/GRAFTS ANGIOGRAPHY N/A 05/21/2017   Procedure: Left Heart Cath and Cors/Grafts Angiography;  Surgeon: [name], MD;  Location: [place] CV LAB;  Service: Cardiovascular;  Laterality: N/A;  . LEFT HEART CATH AND CORS/GRAFTS ANGIOGRAPHY N/A 05/20/2020   Procedure: LEFT HEART CATH AND CORS/GRAFTS ANGIOGRAPHY;  Surgeon: [name], MD;  Location: [place] [facility];  Service: Cardiovascular;  Laterality: N/A;  . PACEMAKER IMPLANT N/A 03/14/2020   Procedure: PACEMAKER IMPLANT;  Surgeon: Lovena Le,  Champ Mungo, MD;  Location: [place] CV LAB;  Service: Cardiovascular;  Laterality: N/A;  . PROSTATE SURGERY    . PVC ABLATION N/A 10/27/2017   Procedure: PVC ABLATION;  Surgeon: Evans Lance, MD;  Location: [place] [facility];  Service: Cardiovascular;  Laterality: N/A;  . TONSILLECTOMY    . URINARY SURGERY     scar tissue    reports that he quit smoking about 49 years ago. He has a 13.00 pack-year smoking history. He has never used smokeless tobacco. He reports that he does not drink alcohol and does not use drugs. family history includes Cancer in  his sister; Diabetes in his brother and sister; Heart disease in his brother, father, and mother; Hyperlipidemia in his brother and father; Hypertension in his brother, father, and sister; Stroke in his mother. Allergies  Allergen Reactions  . Lisinopril Swelling and Other (See Comments)    Angioedema.   . Losartan Swelling and Other (See Comments)    Lips swell Angioedema   . Nifedipine Swelling and Other (See Comments)    Sugar increase  . Triamterene Swelling and Other (See Comments)    Face swells, no breathing impairment  . Hydralazine Hcl Other (See Comments)    Fatigue; poor appetite  . Losartan Potassium-Hctz Swelling and Other (See Comments)  . Amoxicillin Rash and Other (See Comments)    Did it involve swelling of the face/tongue/throat, SOB, or low BP? No Did it involve sudden or severe rash/hives, skin peeling, or any reaction on the inside of your mouth or nose? No Did you need to seek medical attention at a hospital or doctor's office? No When did it last happen?10 + years If all above answers are "NO", may proceed with cephalosporin use.    . Ampicillin Rash and Other (See Comments)    Did it involve swelling of the face/tongue/throat, SOB, or low BP? No Did it involve sudden or severe rash/hives, skin peeling, or any reaction on the inside of your mouth or nose? No Did you need to seek medical attention at a hospital or doctor's office? No When did it last happen?10 + years If all above answers are "NO", may proceed with cephalosporin use.   . Carvedilol Other (See Comments)    Low heart rate    Current Outpatient Medications on File Prior to Visit  Medication Sig Dispense Refill  . apixaban (ELIQUIS) 2.5 MG TABS tablet Take 1 tablet (2.5 mg total) by mouth 2 (two) times daily. (0800 & 2000) 180 tablet 3  . aspirin EC 81 MG tablet Take 1 tablet (81 mg total) by mouth daily. 90 tablet 3  . atorvastatin (LIPITOR) 40 MG tablet Take 40 mg by mouth at  bedtime.     . carvedilol (COREG) 3.125 MG tablet Take 1 tablet (3.125 mg total) by mouth 2 (two) times daily. 180 tablet 3  . cholecalciferol (VITAMIN D3) 25 MCG (1000 UNIT) tablet Take 1,000 Units by mouth daily.    [name] glucose blood test strip Accu-Check Guide Strips. Dx E11.9 Use to test blood glucose level 1 time a day. 100 each 11  . hydrALAZINE (APRESOLINE) 10 MG tablet Take 10 mg by mouth 3 (three) times daily as needed (elevated bp greater than 160).    . Lancets MISC Use as directed twice per day E11.9 200 each 3  . magnesium oxide (MAG-OX) 400 MG tablet Take 800 mg by mouth 2 (two) times daily.     . mupirocin ointment (BACTROBAN) 2 %  Apply 1 application topically 2 (two) times daily. 30 g 2  . nitroGLYCERIN (NITROSTAT) 0.4 MG SL tablet Place 1 tablet (0.4 mg total) under the tongue every 5 (five) minutes as needed for chest pain (x 3 doses). 30 tablet 3  . pantoprazole (PROTONIX) 40 MG tablet Take 1 tablet (40 mg total) by mouth daily. 90 tablet 3  . terazosin (HYTRIN) 1 MG capsule Take 1 mg by mouth at bedtime.  30 capsule 11  . vitamin C (ASCORBIC ACID) 500 MG tablet Take 500 mg by mouth daily with breakfast.     . Zinc 22.5 MG TABS Take 22.5 mg by mouth every other day.     No current facility-administered medications on file prior to visit.   Review of Systems All otherwise neg per pt    Objective:   Physical Exam BP 122/72 (BP Location: Left Arm, Patient Position: Sitting, Cuff Size: Large)   Pulse 82   Temp 98.4 F (36.9 C) (Oral)   Ht 6\' 2"  (1.88 m)   Wt 216 lb (98 kg)   SpO2 97%   BMI 27.73 kg/m  VS noted,  Constitutional: Pt appears in NAD HENT: Head: NCAT.  Right Ear: External ear normal.  Left Ear: External ear normal.  Eyes: . Pupils are equal, round, and reactive to light. Conjunctivae and EOM are normal Nose: without d/c or deformity Neck: Neck supple. Gross normal ROM Cardiovascular: Normal rate and regular rhythm.   Pulmonary/Chest: Effort normal and  breath sounds without rales or wheezing.  Abd:  Soft, NT, ND, + BS, no organomegaly Neurological: Pt is alert. At baseline orientation, motor grossly intact Skin: Skin is warm. No rashes, other new lesions, no LE edema Psychiatric: Pt behavior is normal without agitation  All otherwise neg per pt Lab Results  Component Value Date   WBC 4.9 05/16/2020   HGB 10.0 (L) 05/16/2020   HCT 30.8 (L) 05/16/2020   PLT 139 (L) 05/16/2020   GLUCOSE 144 (H) 05/16/2020   CHOL 114 11/21/2019   TRIG 69 11/21/2019   HDL 38 (L) 11/21/2019   LDLDIRECT 67.0 09/16/2016   LDLCALC 62 11/21/2019   ALT 15 01/25/2020   AST 19 01/25/2020   NA 138 05/16/2020   K 4.4 05/16/2020   CL 103 05/16/2020   CREATININE 2.15 (H) 05/16/2020   BUN 29 (H) 05/16/2020   CO2 28 05/16/2020   TSH 3.885 01/25/2020   PSA 1.09 03/31/2018   INR 1.1 01/02/2020   HGBA1C 6.5 (H) 11/21/2019   MICROALBUR 5.1 (H) 03/31/2018      Assessment & Plan:

## 2020-09-02 NOTE — Patient Instructions (Signed)
Please continue all other medications as before, and refills have been done if requested.  Please have the pharmacy call with any other refills you may need.  Please continue your efforts at being more active, low cholesterol diet, and weight control.  Please keep your appointments with your specialists as you may have planned  You will be contacted regarding the referral for: PFTs (lung testing), and pulmonary referral

## 2020-09-08 ENCOUNTER — Encounter: Payer: Self-pay | Admitting: Internal Medicine

## 2020-09-08 DIAGNOSIS — D649 Anemia, unspecified: Secondary | ICD-10-CM | POA: Insufficient documentation

## 2020-09-08 DIAGNOSIS — R0600 Dyspnea, unspecified: Secondary | ICD-10-CM | POA: Insufficient documentation

## 2020-09-08 NOTE — Assessment & Plan Note (Signed)
F/u lab per renal,  to f/u any worsening symptoms or concerns

## 2020-09-08 NOTE — Addendum Note (Signed)
Addended by: [name] on: 09/08/2020 11:11 PM   Modules accepted: Orders

## 2020-09-08 NOTE — Assessment & Plan Note (Signed)
stable overall by history and exam, recent data reviewed with pt, and pt to continue medical treatment as before,  to f/u any worsening symptoms or concerns  

## 2020-09-08 NOTE — Assessment & Plan Note (Signed)
For f/u renal labs

## 2020-09-08 NOTE — Assessment & Plan Note (Addendum)
Etiology unclear, for pFTs and pulm referral  I spent 41 minutes in preparing to see the patient by review of recent labs, imaging and procedures, obtaining and reviewing separately obtained history, communicating with the patient and family or caregiver, ordering medications, tests or procedures, and documenting clinical information in the EHR including the differential Dx, treatment, and any further evaluation and other management of dspnea, anemai, htn, dm, ckd

## 2020-09-11 ENCOUNTER — Other Ambulatory Visit: Payer: Self-pay | Admitting: *Deleted

## 2020-09-11 DIAGNOSIS — I6523 Occlusion and stenosis of bilateral carotid arteries: Secondary | ICD-10-CM

## 2020-09-12 ENCOUNTER — Other Ambulatory Visit: Payer: Self-pay

## 2020-09-12 ENCOUNTER — Encounter: Payer: Self-pay | Admitting: Pulmonary Disease

## 2020-09-12 ENCOUNTER — Ambulatory Visit (INDEPENDENT_AMBULATORY_CARE_PROVIDER_SITE_OTHER): Payer: Medicare Other | Admitting: Pulmonary Disease

## 2020-09-12 VITALS — BP 162/74 | HR 83 | Temp 97.0°F | Ht 74.0 in | Wt 216.0 lb

## 2020-09-12 DIAGNOSIS — R0609 Other forms of dyspnea: Secondary | ICD-10-CM

## 2020-09-12 DIAGNOSIS — R0600 Dyspnea, unspecified: Secondary | ICD-10-CM | POA: Diagnosis not present

## 2020-09-12 NOTE — Patient Instructions (Addendum)
Nice to meet you!  Let's get breathing tests in the next few weeks. This helps make sure we aren't missing anything.  Notification of test results are managed in the following manner: If there are  any recommendations or changes to the  plan of care discussed in office today,  we will contact you and let you know what they are. If you do not hear from [place], then your results are normal and you can view them through your  MyChart account , or a letter will be sent to you. Thank you again for trusting [place] with your care  [facility].  Come back for follow up in 3 months with Dr. Silas Flood.

## 2020-09-12 NOTE — Progress Notes (Signed)
Patient ID: Thomas [facility], male    DOB: 06-Feb-1941, 79 y.o.   MRN: [id number]  Chief Complaint  Patient presents with  . Consult    shortness of breath with activity, had pacemarker placed and out of rhythmn been drinking ensure and feels better    Referring provider: [name], MD  HPI:   Thomas [facility] is a 79 year old with PMH of bradycardia requiring pacemaker and persistent Afib whom we are seeing in consultation at the request of Cathlean Cower, MD for evaluation of DOE.  Multiple notes from cardiology reviewed.  Patient denies any significant history of breathing difficulties or shortness of breath.  He says onset of symptoms around the time he developed atrial fibrillation.  He tolerated a bit short of breath since then.  He notes of the last several months he was a bit more short of breath.  He reports he was found to be bradycardic to the 30s which prompted pacemaker placement 03/14/2020.  He says since then has breathing made improved slightly after pacemaker placement.  Hepersistently in atrial fibrillation despite multiple medications and cardioversions in the past.  He continues on anticoagulation.  He he reports fatigue, significant dyspnea after relatively minimal exertion.  This is been going on for again a few months but it seems like based on what he tells me in timing this is been present more likely for years.  Recently, he has been trying to increase his physical activity.  He is walking more.  He notes over the last couple of days his breathing is much improved.  He attributes this to starting dietary supplements with Ensure over the last week or 2.  He denies any cough.  He has minimal smoking history, quit many many years ago.  No history of asthma.  He does have seasonal allergies.  Review of review of chest x-ray 03/14/20 after pacemaker placement on monitor patient reveals clear lungs, no evidence of hyperinflation.  Work-up for symptoms above include left heart cath with patent  anastomoses from CABG, TTE from [place] reviewed with preserved ejection fraction but with evidence of dilated left atrium and mildly elevated pulmonary artery pressures.  PMH: Coronary artery disease, atrial fibrillation, hyperlipidemia, hypertension Surgical history: Ankle surgery, back surgery, cholecystectomy, CABG Family history: Heart disease in mother and father, stroke in mother  Social history: Lives in [place], retired, lives at [employer] [place]) for many years but moved back to be closer to children, around 10-15-pack-year smoke history but quit many many years ago  Licensed conveyancer / Pulmonary Flowsheets:   ACT:  No flowsheet data found.  MMRC: No flowsheet data found.  Epworth:  No flowsheet data found.  Tests:   FENO:  No results found for: NITRICOXIDE  PFT: No flowsheet data found.  WALK:  No flowsheet data found.  Imaging: Personally reviewed and as per EMR and discussion above  Lab Results: Personally reviewed, eosinophils as high as 500 CBC    Component Value Date/Time   WBC 4.9 05/16/2020 1130   WBC 6.0 02/21/2020 1025   RBC 3.49 (L) 05/16/2020 1130   RBC 3.68 (L) 02/21/2020 1025   HGB 10.0 (L) 05/16/2020 1130   HCT 30.8 (L) 05/16/2020 1130   PLT 139 (L) 05/16/2020 1130   MCV 88 05/16/2020 1130   MCH 28.7 05/16/2020 1130   MCH 28.8 02/21/2020 1025   MCHC 32.5 05/16/2020 1130   MCHC 30.6 02/21/2020 1025   RDW 15.7 (H) 05/16/2020 1130   LYMPHSABS 1.1  05/16/2020 1130   MONOABS 0.5 01/02/2020 1742   EOSABS 0.5 (H) 05/16/2020 1130   BASOSABS 0.1 05/16/2020 1130    BMET    Component Value Date/Time   NA 138 05/16/2020 1130   K 4.4 05/16/2020 1130   CL 103 05/16/2020 1130   CO2 28 05/16/2020 1130   GLUCOSE 144 (H) 05/16/2020 1130   GLUCOSE 161 (H) 02/21/2020 1025   BUN 29 (H) 05/16/2020 1130   CREATININE 2.15 (H) 05/16/2020 1130   CREATININE 1.45 (H) 05/22/2016 0938   CALCIUM 8.7 05/16/2020 1130   GFRNONAA 28 (L) 05/16/2020 1130   GFRNONAA  46 (L) 05/22/2016 0938   GFRAA 32 (L) 05/16/2020 1130   GFRAA 54 (L) 05/22/2016 0938    BNP No results found for: BNP  ProBNP    Component Value Date/Time   PROBNP 277 05/17/2017 1624    Specialty Problems      Pulmonary Problems   Cough   Dyspnea      Allergies  Allergen Reactions  . Lisinopril Swelling and Other (See Comments)    Angioedema.   . Losartan Swelling and Other (See Comments)    Lips swell Angioedema   . Nifedipine Swelling and Other (See Comments)    Sugar increase  . Triamterene Swelling and Other (See Comments)    Face swells, no breathing impairment  . Hydralazine Hcl Other (See Comments)    Fatigue; poor appetite  . Losartan Potassium-Hctz Swelling and Other (See Comments)  . Amoxicillin Rash and Other (See Comments)    Did it involve swelling of the face/tongue/throat, SOB, or low BP? No Did it involve sudden or severe rash/hives, skin peeling, or any reaction on the inside of your mouth or nose? No Did you need to seek medical attention at a hospital or doctor's office? No When did it last happen?10 + years If all above answers are "NO", may proceed with cephalosporin use.    . Ampicillin Rash and Other (See Comments)    Did it involve swelling of the face/tongue/throat, SOB, or low BP? No Did it involve sudden or severe rash/hives, skin peeling, or any reaction on the inside of your mouth or nose? No Did you need to seek medical attention at a hospital or doctor's office? No When did it last happen?10 + years If all above answers are "NO", may proceed with cephalosporin use.   . Carvedilol Other (See Comments)    Low heart rate     Immunization History  Administered Date(s) Administered  . Fluad Quad(high Dose 65+) 07/26/2019, 08/30/2020  . Influenza Split 10/13/2011  . Influenza, High Dose Seasonal PF 09/08/2013, 07/22/2017, 08/04/2018  . Influenza,inj,Quad PF,6+ Mos 08/20/2014, 08/13/2015, 07/31/2016  .  Influenza-Unspecified 08/08/2012  . PFIZER SARS-COV-2 Vaccination 12/20/2019, 01/08/2020  . Pneumococcal Conjugate-13 09/10/2014  . Pneumococcal Polysaccharide-23 05/22/2016  . Pneumococcal-Unspecified 11/11/2006  . Tdap 04/03/2014    Past Medical History:  Diagnosis Date  . AAA (abdominal aortic aneurysm) ([place])   . Arthritis   . Cancer (HCC)    skin & squamous cell  . Carotid artery disease ([place])    a. s/p R CEA 12/2015.  [name] CKD (chronic kidney disease), stage III ([place])    a. per historical labs.  . Coronary artery disease    a. s/p CABG 2004.  . Diabetes mellitus without complication ([place])   . GERD (gastroesophageal reflux disease)   . Hyperlipidemia   . Hypertension   . Medication intolerance Multiple   . PAF (paroxysmal atrial  fibrillation) ([place])   . Pneumonia   . Sinus bradycardia    a. baseline HR 50s-60s, also h/o bradycardia on metoprolol and carvedilol    Tobacco History: Social History   Tobacco Use  Smoking Status Former Smoker  . Packs/day: 1.00  . Years: 13.00  . Pack years: 13.00  . Quit date: 11/30/1970  . Years since quitting: 49.8  Smokeless Tobacco Never Used   Counseling given: Not Answered   Continue to not smoke  Outpatient Encounter Medications as of 09/12/2020  Medication Sig  . apixaban (ELIQUIS) 2.5 MG TABS tablet Take 1 tablet (2.5 mg total) by mouth 2 (two) times daily. (0800 & 2000)  . aspirin EC 81 MG tablet Take 1 tablet (81 mg total) by mouth daily.  [name] atorvastatin (LIPITOR) 40 MG tablet Take 40 mg by mouth at bedtime.   . carvedilol (COREG) 3.125 MG tablet Take 1 tablet (3.125 mg total) by mouth 2 (two) times daily.  . cholecalciferol (VITAMIN D3) 25 MCG (1000 UNIT) tablet Take 1,000 Units by mouth daily.  [name] glucose blood test strip Accu-Check Guide Strips. Dx E11.9 Use to test blood glucose level 1 time a day.  . hydrALAZINE (APRESOLINE) 10 MG tablet Take 10 mg by mouth 3 (three) times daily as needed (elevated bp greater than 160).   . Lancets MISC Use as directed twice per day E11.9  . magnesium oxide (MAG-OX) 400 MG tablet Take 800 mg by mouth 2 (two) times daily.   . mupirocin ointment (BACTROBAN) 2 % Apply 1 application topically 2 (two) times daily.  . nitroGLYCERIN (NITROSTAT) 0.4 MG SL tablet Place 1 tablet (0.4 mg total) under the tongue every 5 (five) minutes as needed for chest pain (x 3 doses).  . pantoprazole (PROTONIX) 40 MG tablet Take 1 tablet (40 mg total) by mouth daily.  [name] terazosin (HYTRIN) 1 MG capsule Take 1 mg by mouth at bedtime.   . vitamin C (ASCORBIC ACID) 500 MG tablet Take 500 mg by mouth daily with breakfast.   . Zinc 22.5 MG TABS Take 22.5 mg by mouth every other day.   No facility-administered encounter medications on file as of 09/12/2020.     Review of Systems  Review of Systems  Denies chest pain with exertion.  No orthopnea or PND.  No significant lower extremity swelling.  Comprehensive review of systems otherwise negative. Physical Exam  BP (!) 162/74 (BP Location: Right Arm, Cuff Size: Normal)   Pulse 83   Temp (!) 97 F (36.1 C) (Temporal)   Ht 6\' 2"  (1.88 m)   Wt 216 lb (98 kg)   SpO2 100%   BMI 27.73 kg/m   Wt Readings from Last 5 Encounters:  09/12/20 216 lb (98 kg)  09/02/20 216 lb (98 kg)  06/25/20 (!) 220 lb (99.8 kg)  06/10/20 218 lb 9.6 oz (99.2 kg)  05/20/20 209 lb (94.8 kg)    BMI Readings from Last 5 Encounters:  09/12/20 27.73 kg/m  09/02/20 27.73 kg/m  06/25/20 28.25 kg/m  06/10/20 28.07 kg/m  05/20/20 26.83 kg/m     Physical Exam General: Well-appearing in no acute distress, sitting up in exam chair Eyes: EOMI, no icterus Neck: No JVP appreciated, supple Respiratory: Clear aspiration bilaterally, good air movement bilaterally, no wheezes or crackles Cardiovascular: Irregularly irregular, no murmurs appreciated, warm extremities Abdomen/GI: Bowel sounds present, nondistended MSK: No joint effusion, no synovitis Neuro: Normal gait, no  weakness Psych: Normal mood, full affect  Assessment & Plan:  Dyspnea on exertion: Suspect also factorial related to cardiac issues such as potential A. fib with RVR with exertion, mitral valve regurgitation, likely pulmonary hypertension as demonstrated on TTE, deconditioning.  Suspect deconditioning is largest contributor as well as baseline chronic exercise intolerance in setting of his A. fib.  Asthma is considered although he is spontaneously improving.  Notably he does have eosinophils of 500 on recent CBC with differential as well as reports seasonal allergies.  Would not be unreasonable to trial inhalers in the future if symptoms do not continue to improve.  Given symptomatic improvement no further treatment or work-up at this time.  Can consider obtaining PFTs in the future.  Pulmonary hypertension: Suspected based on estimated elevated PA pressures on TTE 03/2020.  RV function is preserved.  RV cavity size is borderline, RA reported as enlarged.  Suspect largest contribution (likely total contribution) is group 2 disease in the setting of his mitral valve regurg, possible diastolic dysfunction as evidenced by dilated left atrium.  Return in about 3 months (around 12/13/2020).   Lanier Clam, MD 09/12/2020

## 2020-09-16 ENCOUNTER — Ambulatory Visit (INDEPENDENT_AMBULATORY_CARE_PROVIDER_SITE_OTHER): Payer: Medicare Other

## 2020-09-16 DIAGNOSIS — I495 Sick sinus syndrome: Secondary | ICD-10-CM

## 2020-09-17 LAB — CUP PACEART REMOTE DEVICE CHECK
Battery Remaining Longevity: 146 mo
Battery Remaining Percentage: 95.5 %
Battery Voltage: 3.02 V
Brady Statistic RV Percent Paced: 6 %
Date Time Interrogation Session: 20211018020012
Implantable Lead Implant Date: 20210415
Implantable Lead Implant Date: 20210415
Implantable Lead Location: 753859
Implantable Lead Location: 753860
Implantable Pulse Generator Implant Date: 20210415
Lead Channel Impedance Value: 590 Ohm
Lead Channel Pacing Threshold Amplitude: 0.5 V
Lead Channel Pacing Threshold Pulse Width: 0.5 ms
Lead Channel Sensing Intrinsic Amplitude: 12 mV
Lead Channel Setting Pacing Amplitude: 2.5 V
Lead Channel Setting Pacing Pulse Width: 0.5 ms
Lead Channel Setting Sensing Sensitivity: 2 mV
Pulse Gen Model: 2272
Pulse Gen Serial Number: 3813712

## 2020-09-19 NOTE — Progress Notes (Signed)
Remote pacemaker transmission.   

## 2020-10-01 DIAGNOSIS — C44329 Squamous cell carcinoma of skin of other parts of face: Secondary | ICD-10-CM | POA: Diagnosis not present

## 2020-10-01 DIAGNOSIS — L821 Other seborrheic keratosis: Secondary | ICD-10-CM | POA: Diagnosis not present

## 2020-10-01 DIAGNOSIS — L820 Inflamed seborrheic keratosis: Secondary | ICD-10-CM | POA: Diagnosis not present

## 2020-10-01 DIAGNOSIS — L570 Actinic keratosis: Secondary | ICD-10-CM | POA: Diagnosis not present

## 2020-10-03 ENCOUNTER — Other Ambulatory Visit: Payer: Self-pay

## 2020-10-03 ENCOUNTER — Ambulatory Visit (INDEPENDENT_AMBULATORY_CARE_PROVIDER_SITE_OTHER): Payer: Medicare Other | Admitting: Podiatry

## 2020-10-03 DIAGNOSIS — L84 Corns and callosities: Secondary | ICD-10-CM | POA: Diagnosis not present

## 2020-10-03 DIAGNOSIS — I6523 Occlusion and stenosis of bilateral carotid arteries: Secondary | ICD-10-CM | POA: Diagnosis not present

## 2020-10-03 DIAGNOSIS — E1140 Type 2 diabetes mellitus with diabetic neuropathy, unspecified: Secondary | ICD-10-CM

## 2020-10-08 ENCOUNTER — Other Ambulatory Visit: Payer: Self-pay

## 2020-10-08 ENCOUNTER — Ambulatory Visit (INDEPENDENT_AMBULATORY_CARE_PROVIDER_SITE_OTHER): Payer: Medicare Other | Admitting: Pulmonary Disease

## 2020-10-08 DIAGNOSIS — R0600 Dyspnea, unspecified: Secondary | ICD-10-CM

## 2020-10-08 DIAGNOSIS — R0609 Other forms of dyspnea: Secondary | ICD-10-CM

## 2020-10-08 LAB — PULMONARY FUNCTION TEST
DL/VA % pred: 119 %
DL/VA: 4.54 ml/min/mmHg/L
DLCO cor % pred: 81 %
DLCO cor: 22.97 ml/min/mmHg
DLCO unc % pred: 81 %
DLCO unc: 22.97 ml/min/mmHg
FEF 25-75 Post: 1.49 L/sec
FEF 25-75 Pre: 1.12 L/sec
FEF2575-%Change-Post: 33 %
FEF2575-%Pred-Post: 61 %
FEF2575-%Pred-Pre: 46 %
FEV1-%Change-Post: 7 %
FEV1-%Pred-Post: 62 %
FEV1-%Pred-Pre: 58 %
FEV1-Post: 2.18 L
FEV1-Pre: 2.02 L
FEV1FVC-%Change-Post: 0 %
FEV1FVC-%Pred-Pre: 92 %
FEV6-%Change-Post: 7 %
FEV6-%Pred-Post: 71 %
FEV6-%Pred-Pre: 66 %
FEV6-Post: 3.28 L
FEV6-Pre: 3.04 L
FEV6FVC-%Change-Post: 0 %
FEV6FVC-%Pred-Post: 106 %
FEV6FVC-%Pred-Pre: 105 %
FVC-%Change-Post: 7 %
FVC-%Pred-Post: 67 %
FVC-%Pred-Pre: 63 %
FVC-Post: 3.28 L
FVC-Pre: 3.06 L
Post FEV1/FVC ratio: 67 %
Post FEV6/FVC ratio: 100 %
Pre FEV1/FVC ratio: 66 %
Pre FEV6/FVC Ratio: 99 %
RV % pred: 155 %
RV: 4.58 L
TLC % pred: 97 %
TLC: 7.89 L

## 2020-10-08 NOTE — Progress Notes (Signed)
Full PFT performed today. °

## 2020-10-09 NOTE — Progress Notes (Signed)
Subjective: 79 year old male presents the office today for follow evaluation of preulcerative calluses in the right foot. He also is picking up modified inserts for his shoes. He denies any redness/drainage or signs of infection. No open lesions. Denies any systemic complaints such as fevers, chills, nausea, vomiting. No acute changes since last appointment, and no other complaints at this time.   Objective: AAO x3, NAD DP/PT pulses palpable bilaterally, CRT less than 3 seconds Hyperkeratotic lesions right foot fifth metatarsal as well as with metatarsal base.  Upon debridement there is no ongoing ulceration drainage or signs of infection.   Previous ankle fusion right side No pain with calf compression, swelling, warmth, erythema  Assessment: Preulcerative calluses right side with improvement  Plan: -All treatment options discussed with the patient including all alternatives, risks, complications.  -Debrided the preulcerative callus without any complications or bleeding.  Continue offloading. -Patient encouraged to call the office with any questions, concerns, change in symptoms.   [name] DPM

## 2020-10-16 ENCOUNTER — Ambulatory Visit (INDEPENDENT_AMBULATORY_CARE_PROVIDER_SITE_OTHER): Payer: Medicare Other | Admitting: Internal Medicine

## 2020-10-16 ENCOUNTER — Ambulatory Visit: Payer: Medicare Other | Admitting: Internal Medicine

## 2020-10-16 ENCOUNTER — Encounter: Payer: Self-pay | Admitting: Internal Medicine

## 2020-10-16 ENCOUNTER — Other Ambulatory Visit: Payer: Self-pay

## 2020-10-16 VITALS — BP 130/80 | HR 89 | Temp 97.8°F | Ht 74.0 in | Wt 218.0 lb

## 2020-10-16 DIAGNOSIS — R0600 Dyspnea, unspecified: Secondary | ICD-10-CM

## 2020-10-16 DIAGNOSIS — N1832 Chronic kidney disease, stage 3b: Secondary | ICD-10-CM

## 2020-10-16 DIAGNOSIS — I700 Atherosclerosis of aorta: Secondary | ICD-10-CM

## 2020-10-16 DIAGNOSIS — I6523 Occlusion and stenosis of bilateral carotid arteries: Secondary | ICD-10-CM | POA: Diagnosis not present

## 2020-10-16 DIAGNOSIS — E1122 Type 2 diabetes mellitus with diabetic chronic kidney disease: Secondary | ICD-10-CM

## 2020-10-16 DIAGNOSIS — E7800 Pure hypercholesterolemia, unspecified: Secondary | ICD-10-CM | POA: Diagnosis not present

## 2020-10-16 DIAGNOSIS — I10 Essential (primary) hypertension: Secondary | ICD-10-CM | POA: Diagnosis not present

## 2020-10-16 LAB — HEPATIC FUNCTION PANEL
ALT: 27 U/L (ref 0–53)
AST: 29 U/L (ref 0–37)
Albumin: 4.1 g/dL (ref 3.5–5.2)
Alkaline Phosphatase: 70 U/L (ref 39–117)
Bilirubin, Direct: 0.1 mg/dL (ref 0.0–0.3)
Total Bilirubin: 0.5 mg/dL (ref 0.2–1.2)
Total Protein: 6.7 g/dL (ref 6.0–8.3)

## 2020-10-16 LAB — BASIC METABOLIC PANEL
BUN: 29 mg/dL — ABNORMAL HIGH (ref 6–23)
CO2: 32 mEq/L (ref 19–32)
Calcium: 9.2 mg/dL (ref 8.4–10.5)
Chloride: 103 mEq/L (ref 96–112)
Creatinine, Ser: 1.86 mg/dL — ABNORMAL HIGH (ref 0.40–1.50)
GFR: 33.57 mL/min — ABNORMAL LOW (ref >60.00)
Glucose, Bld: 92 mg/dL (ref 70–99)
Potassium: 5.5 mEq/L — ABNORMAL HIGH (ref 3.5–5.1)
Sodium: 139 mEq/L (ref 135–145)

## 2020-10-16 LAB — LIPID PANEL
Cholesterol: 126 mg/dL (ref 0–200)
HDL: 44 mg/dL (ref >39.00)
LDL Cholesterol: 60 mg/dL (ref 0–99)
NonHDL: 81.8
Total CHOL/HDL Ratio: 3
Triglycerides: 108 mg/dL (ref 0.0–149.0)
VLDL: 21.6 mg/dL (ref 0.0–40.0)

## 2020-10-16 LAB — HEMOGLOBIN A1C: Hgb A1c MFr Bld: 7 % — ABNORMAL HIGH (ref 4.6–6.5)

## 2020-10-16 NOTE — Progress Notes (Signed)
Subjective:    Patient ID: Thomas Marshall, male    DOB: [birth year], 79 y.o.   MRN: [id number]  HPI  Here to f/u; overall doing ok,  Pt denies chest pain, increasing sob or doe, wheezing, orthopnea, PND, increased LE swelling, palpitations, dizziness or syncope.  Pt denies new neurological symptoms such as new headache, or facial or extremity weakness or numbness.  Pt denies polydipsia, polyuria, or low sugar episode.  Pt states overall good compliance with meds, mostly trying to follow appropriate diet, with wt overall stable,  but little exercise however. Off metformin (on no OHA now) per renal due to renal slowing at least 6 wks ago.  Last a1c 6.5 in dec 2020.  Toelrating statin.   Past Medical History:  Diagnosis Date  . AAA (abdominal aortic aneurysm) ([place])   . Arthritis   . Cancer (HCC)    skin & squamous cell  . Carotid artery disease ([place])    a. s/p R CEA 12/2015.  [name] CKD (chronic kidney disease), stage III ([place])    a. per historical labs.  . Coronary artery disease    a. s/p CABG 2004.  . Diabetes mellitus without complication ([place])   . GERD (gastroesophageal reflux disease)   . Hyperlipidemia   . Hypertension   . Medication intolerance Multiple   . PAF (paroxysmal atrial fibrillation) ([place])   . Pneumonia   . Sinus bradycardia    a. baseline HR 50s-60s, also h/o bradycardia on metoprolol and carvedilol   Past Surgical History:  Procedure Laterality Date  . ANKLE SURGERY     right fused  . BACK SURGERY     low back   . CARDIOVERSION N/A 02/14/2020   Procedure: CARDIOVERSION;  Surgeon: Geralynn Rile, MD;  Location: [place];  Service: Cardiovascular;  Laterality: N/A;  . CARDIOVERSION N/A 02/26/2020   Procedure: CARDIOVERSION;  Surgeon: [name], MD;  Location: [facility] ENDOSCOPY;  Service: Cardiovascular;  Laterality: N/A;  . CARDIOVERSION N/A 04/12/2020   Procedure: CARDIOVERSION;  Surgeon: Skeet Latch, MD;  Location: [facility] ENDOSCOPY;  Service: Cardiovascular;   Laterality: N/A;  . CHOLECYSTECTOMY N/A 12/07/2013   Procedure: LAPAROSCOPIC CHOLECYSTECTOMY ;  Surgeon: [name], MD;  Location: [place];  Service: General;  Laterality: N/A;  . COLONOSCOPY    . CORONARY ARTERY BYPASS GRAFT  2004   [place]  . CORONARY STENT INTERVENTION N/A 05/21/2017   Procedure: Coronary Stent Intervention;  Surgeon: [name], MD;  Location: [place] CV LAB;  Service: Cardiovascular;  Laterality: N/A;  . ENDARTERECTOMY Right 12/10/2015   Procedure: Right Carotid ENDARTERECTOMY;  Surgeon: Conrad Springville, MD;  Location: [place];  Service: Vascular;  Laterality: Right;  . EXPLORATION POST OPERATIVE OPEN HEART    . FINGER SURGERY     skin graft on rt index  . FINGER SURGERY Right    right index finger.  [name] HERNIA REPAIR Right    RIH  . JOINT REPLACEMENT    . KNEE SURGERY     replacement on both knees  . LEFT HEART CATH AND CORS/GRAFTS ANGIOGRAPHY N/A 05/21/2017   Procedure: Left Heart Cath and Cors/Grafts Angiography;  Surgeon: [name], MD;  Location: [place] CV LAB;  Service: Cardiovascular;  Laterality: N/A;  . LEFT HEART CATH AND CORS/GRAFTS ANGIOGRAPHY N/A 05/20/2020   Procedure: LEFT HEART CATH AND CORS/GRAFTS ANGIOGRAPHY;  Surgeon: [name], MD;  Location: [place] CV LAB;  Service: Cardiovascular;  Laterality: N/A;  . PACEMAKER IMPLANT N/A  03/14/2020   Procedure: PACEMAKER IMPLANT;  Surgeon: Evans Lance, MD;  Location: [place] CV LAB;  Service: Cardiovascular;  Laterality: N/A;  . PROSTATE SURGERY    . PVC ABLATION N/A 10/27/2017   Procedure: PVC ABLATION;  Surgeon: Evans Lance, MD;  Location: [place] CV LAB;  Service: Cardiovascular;  Laterality: N/A;  . TONSILLECTOMY    . URINARY SURGERY     scar tissue    reports that he quit smoking about 49 years ago. He has a 13.00 pack-year smoking history. He has never used smokeless tobacco. He reports that he does not drink alcohol and does not use drugs. family history  includes Cancer in his sister; Diabetes in his brother and sister; Heart disease in his brother, father, and mother; Hyperlipidemia in his brother and father; Hypertension in his brother, father, and sister; Stroke in his mother. Allergies  Allergen Reactions  . Lisinopril Swelling and Other (See Comments)    Angioedema.   . Losartan Swelling and Other (See Comments)    Lips swell Angioedema   . Nifedipine Swelling and Other (See Comments)    Sugar increase  . Triamterene Swelling and Other (See Comments)    Face swells, no breathing impairment  . Hydralazine Hcl Other (See Comments)    Fatigue; poor appetite  . Losartan Potassium-Hctz Swelling and Other (See Comments)  . Amoxicillin Rash and Other (See Comments)    Did it involve swelling of the face/tongue/throat, SOB, or low BP? No Did it involve sudden or severe rash/hives, skin peeling, or any reaction on the inside of your mouth or nose? No Did you need to seek medical attention at a hospital or doctor's office? No When did it last happen?10 + years If all above answers are "NO", may proceed with cephalosporin use.    . Ampicillin Rash and Other (See Comments)    Did it involve swelling of the face/tongue/throat, SOB, or low BP? No Did it involve sudden or severe rash/hives, skin peeling, or any reaction on the inside of your mouth or nose? No Did you need to seek medical attention at a hospital or doctor's office? No When did it last happen?10 + years If all above answers are "NO", may proceed with cephalosporin use.   . Carvedilol Other (See Comments)    Low heart rate    Current Outpatient Medications on File Prior to Visit  Medication Sig Dispense Refill  . apixaban (ELIQUIS) 2.5 MG TABS tablet Take 1 tablet (2.5 mg total) by mouth 2 (two) times daily. (0800 & 2000) 180 tablet 3  . aspirin EC 81 MG tablet Take 1 tablet (81 mg total) by mouth daily. 90 tablet 3  . atorvastatin (LIPITOR) 40 MG tablet Take 40  mg by mouth at bedtime.     . carvedilol (COREG) 3.125 MG tablet Take 1 tablet (3.125 mg total) by mouth 2 (two) times daily. 180 tablet 3  . cholecalciferol (VITAMIN D3) 25 MCG (1000 UNIT) tablet Take 1,000 Units by mouth daily.    [name] glucose blood test strip Accu-Check Guide Strips. Dx E11.9 Use to test blood glucose level 1 time a day. 100 each 11  . hydrALAZINE (APRESOLINE) 10 MG tablet Take 10 mg by mouth 3 (three) times daily as needed (elevated bp greater than 160).    . Lancets MISC Use as directed twice per day E11.9 200 each 3  . magnesium oxide (MAG-OX) 400 MG tablet Take 800 mg by mouth 2 (two) times daily.     [name]  mupirocin ointment (BACTROBAN) 2 % Apply 1 application topically 2 (two) times daily. 30 g 2  . nitroGLYCERIN (NITROSTAT) 0.4 MG SL tablet Place 1 tablet (0.4 mg total) under the tongue every 5 (five) minutes as needed for chest pain (x 3 doses). 30 tablet 3  . pantoprazole (PROTONIX) 40 MG tablet Take 1 tablet (40 mg total) by mouth daily. 90 tablet 3  . terazosin (HYTRIN) 1 MG capsule Take 1 mg by mouth at bedtime.  30 capsule 11  . vitamin C (ASCORBIC ACID) 500 MG tablet Take 500 mg by mouth daily with breakfast.     . Zinc 22.5 MG TABS Take 22.5 mg by mouth every other day.     No current facility-administered medications on file prior to visit.   Review of Systems All otherwise neg per pt    Objective:   Physical Exam BP 130/80 (BP Location: Left Arm, Patient Position: Sitting, Cuff Size: Large)   Pulse 89   Temp 97.8 F (36.6 C) (Oral)   Ht 6\' 2"  (1.88 m)   Wt 218 lb (98.9 kg)   SpO2 98%   BMI 27.99 kg/m  VS noted,  Constitutional: Pt appears in NAD HENT: Head: NCAT.  Right Ear: External ear normal.  Left Ear: External ear normal.  Eyes: . Pupils are equal, round, and reactive to light. Conjunctivae and EOM are normal Nose: without d/c or deformity Neck: Neck supple. Gross normal ROM Cardiovascular: Normal rate and regular rhythm.   Pulmonary/Chest:  Effort normal and breath sounds without rales or wheezing.  Abd:  Soft, NT, ND, + BS, no organomegaly Neurological: Pt is alert. At baseline orientation, motor grossly intact Skin: Skin is warm. No rashes, other new lesions, no LE edema Psychiatric: Pt behavior is normal without agitation  All otherwise neg per pt  Lab Results  Component Value Date   WBC 4.9 05/16/2020   HGB 10.0 (L) 05/16/2020   HCT 30.8 (L) 05/16/2020   PLT 139 (L) 05/16/2020   GLUCOSE 144 (H) 05/16/2020   CHOL 114 11/21/2019   TRIG 69 11/21/2019   HDL 38 (L) 11/21/2019   LDLDIRECT 67.0 09/16/2016   LDLCALC 62 11/21/2019   ALT 15 01/25/2020   AST 19 01/25/2020   NA 138 05/16/2020   K 4.4 05/16/2020   CL 103 05/16/2020   CREATININE 2.15 (H) 05/16/2020   BUN 29 (H) 05/16/2020   CO2 28 05/16/2020   TSH 3.885 01/25/2020   PSA 1.09 03/31/2018   INR 1.1 01/02/2020   HGBA1C 6.5 (H) 11/21/2019   MICROALBUR 5.1 (H) 03/31/2018       Assessment & Plan:

## 2020-10-16 NOTE — Patient Instructions (Signed)
Please continue all other medications as before, and refills have been done if requested.  Please have the pharmacy call with any other refills you may need.  Please keep your appointments with your specialists as you may have planned  Please go to the LAB at the blood drawing area for the tests to be done  You will be contacted by phone if any changes need to be made immediately.  Otherwise, you will receive a letter about your results with an explanation, but please check with MyChart first.  Please remember to sign up for MyChart if you have not done so, as this will be important to you in the future with finding out test results, communicating by private email, and scheduling acute appointments online when needed.      

## 2020-10-18 ENCOUNTER — Encounter: Payer: Self-pay | Admitting: Internal Medicine

## 2020-10-19 ENCOUNTER — Encounter: Payer: Self-pay | Admitting: Internal Medicine

## 2020-10-19 NOTE — Assessment & Plan Note (Signed)
stable overall by history and exam, recent data reviewed with pt, and pt to continue medical treatment as before,  to f/u any worsening symptoms or concerns, cont statin 

## 2020-10-19 NOTE — Assessment & Plan Note (Signed)
Borderline elev, pt declines change for no, cont same tx, f/u cards as planned

## 2020-10-19 NOTE — Assessment & Plan Note (Signed)
stable overall by history and exam, recent data reviewed with pt, and pt to continue medical treatment as before,  to f/u any worsening symptoms or concerns, to f/u renal as planned 

## 2020-10-19 NOTE — Assessment & Plan Note (Addendum)
Off metformin per renal, for f/u lab, consider add different oha  I spent 31 minutes in preparing to see the patient by review of recent labs, imaging and procedures, obtaining and reviewing separately obtained history, communicating with the patient and family or caregiver, ordering medications, tests or procedures, and documenting clinical information in the EHR including the differential Dx, treatment, and any further evaluation and other management of dm, ckd, htn, hld, dyspnea, aortic atherosclerosis,

## 2020-10-19 NOTE — Assessment & Plan Note (Signed)
To cont statin, bp control

## 2020-10-19 NOTE — Assessment & Plan Note (Signed)
Mostly c/w deconditioning,  to f/u any worsening symptoms or concerns

## 2020-10-23 ENCOUNTER — Other Ambulatory Visit: Payer: Self-pay

## 2020-10-23 ENCOUNTER — Ambulatory Visit (HOSPITAL_COMMUNITY)
Admission: RE | Admit: 2020-10-23 | Discharge: 2020-10-23 | Disposition: A | Discharge status: Home / Self Care | Payer: Medicare Other | Source: Ambulatory Visit | Attending: Vascular Surgery | Admitting: Vascular Surgery

## 2020-10-23 ENCOUNTER — Ambulatory Visit (INDEPENDENT_AMBULATORY_CARE_PROVIDER_SITE_OTHER): Payer: Medicare Other | Admitting: Vascular Surgery

## 2020-10-23 ENCOUNTER — Encounter: Payer: Self-pay | Admitting: Vascular Surgery

## 2020-10-23 VITALS — BP 128/71 | HR 89 | Temp 98.2°F | Resp 18 | Ht 74.0 in | Wt 220.0 lb

## 2020-10-23 DIAGNOSIS — I714 Abdominal aortic aneurysm, without rupture, unspecified: Secondary | ICD-10-CM

## 2020-10-23 DIAGNOSIS — Z9889 Other specified postprocedural states: Secondary | ICD-10-CM

## 2020-10-23 DIAGNOSIS — I6523 Occlusion and stenosis of bilateral carotid arteries: Secondary | ICD-10-CM | POA: Insufficient documentation

## 2020-10-23 NOTE — Progress Notes (Signed)
REASON FOR VISIT:   Follow-up of bilateral carotid disease.  MEDICAL ISSUES:   BILATERAL CAROTID DISEASE: The patient is undergone previous right carotid endarterectomy by Dr. Adele Barthel in 2017.  This is widely patent.  He has a 60 to 79% left carotid stenosis which is asymptomatic.  He understands we would not consider carotid endarterectomy unless the left carotid stenosis progressed to greater than 80% or he became symptomatic.  He is on aspirin and is on a statin.  He is not a smoker.  I will get a follow-up carotid duplex scan in 6 months and I will see him back at that time.  He knows to call sooner if he has problems.  ABDOMINAL AORTIC ANEURYSM: This patient had a 3.2 cm infrarenal abdominal aortic aneurysm back in March 2019.  I recommended a follow-up duplex scan in 3 years.  Thus he would be due in March 2022.  However we will coordinate this with his carotid duplex scan in 6 months so that he does not have 2 separate visits.  He is not a smoker.  His blood pressures been under good control.  He understands we would not consider elective repair unless the aneurysm reached 5.5 cm in maximum diameter in a normal risk patient.   HPI:   Thomas Marshall is a pleasant 79 y.o. male who is undergone a previous right carotid endarterectomy and we have been following with an asymptomatic 60 to 79% left carotid stenosis.  In addition he has a small 3.2 cm infrarenal abdominal aortic aneurysm based on duplex scan in March 2019.  He is due for a follow-up study in March 2022.  Since I saw him last, he denies any history of stroke, TIAs, expressive or receptive aphasia, or amaurosis fugax.  He was having some problems with weakness and fatigue and is had a pacemaker placed which is helped his symptoms significantly.  He is done well since that time.  I do not get any history of claudication or rest pain.  He has had no new onset abdominal pain or back pain.  He does have some chronic back pain and  is undergone previous back surgery.  He is not a smoker.  He is on aspirin and is on a statin.  He is on Eliquis for A. fib.  Past Medical History:  Diagnosis Date  . AAA (abdominal aortic aneurysm) ([place])   . Arthritis   . Cancer (HCC)    skin & squamous cell  . Carotid artery disease ([place])    a. s/p R CEA 12/2015.  [name] CKD (chronic kidney disease), stage III ([place])    a. per historical labs.  . Coronary artery disease    a. s/p CABG 2004.  . Diabetes mellitus without complication ([place])   . GERD (gastroesophageal reflux disease)   . Hyperlipidemia   . Hypertension   . Medication intolerance Multiple   . PAF (paroxysmal atrial fibrillation) ([place])   . Pneumonia   . Sinus bradycardia    a. baseline HR 50s-60s, also h/o bradycardia on metoprolol and carvedilol    Family History  Problem Relation Age of Onset  . Stroke Mother   . Heart disease Mother   . Heart disease Father   . Hypertension Father   . Hyperlipidemia Father   . Cancer Sister        breast  and skin  . Diabetes Sister   . Hypertension Sister   . Diabetes Brother   .  Heart disease Brother   . Hyperlipidemia Brother   . Hypertension Brother   . Early death Neg Hx     SOCIAL HISTORY: Social History   Tobacco Use  . Smoking status: Former Smoker    Packs/day: 1.00    Years: 13.00    Pack years: 13.00    Quit date: 11/30/1970    Years since quitting: 49.9  . Smokeless tobacco: Never Used  Substance Use Topics  . Alcohol use: No    Alcohol/week: 0.0 standard drinks    Allergies  Allergen Reactions  . Lisinopril Swelling and Other (See Comments)    Angioedema.   . Losartan Swelling and Other (See Comments)    Lips swell Angioedema   . Nifedipine Swelling and Other (See Comments)    Sugar increase  . Triamterene Swelling and Other (See Comments)    Face swells, no breathing impairment  . Hydralazine Hcl Other (See Comments)    Fatigue; poor appetite  . Losartan Potassium-Hctz Swelling and Other  (See Comments)  . Amoxicillin Rash and Other (See Comments)    Did it involve swelling of the face/tongue/throat, SOB, or low BP? No Did it involve sudden or severe rash/hives, skin peeling, or any reaction on the inside of your mouth or nose? No Did you need to seek medical attention at a hospital or doctor's office? No When did it last happen?10 + years If all above answers are "NO", may proceed with cephalosporin use.    . Ampicillin Rash and Other (See Comments)    Did it involve swelling of the face/tongue/throat, SOB, or low BP? No Did it involve sudden or severe rash/hives, skin peeling, or any reaction on the inside of your mouth or nose? No Did you need to seek medical attention at a hospital or doctor's office? No When did it last happen?10 + years If all above answers are "NO", may proceed with cephalosporin use.   . Carvedilol Other (See Comments)    Low heart rate     Current Outpatient Medications  Medication Sig Dispense Refill  . apixaban (ELIQUIS) 2.5 MG TABS tablet Take 1 tablet (2.5 mg total) by mouth 2 (two) times daily. (0800 & 2000) 180 tablet 3  . aspirin EC 81 MG tablet Take 1 tablet (81 mg total) by mouth daily. 90 tablet 3  . atorvastatin (LIPITOR) 40 MG tablet Take 40 mg by mouth at bedtime.     . carvedilol (COREG) 3.125 MG tablet Take 1 tablet (3.125 mg total) by mouth 2 (two) times daily. 180 tablet 3  . cholecalciferol (VITAMIN D3) 25 MCG (1000 UNIT) tablet Take 1,000 Units by mouth daily.    [name] glucose blood test strip Accu-Check Guide Strips. Dx E11.9 Use to test blood glucose level 1 time a day. 100 each 11  . hydrALAZINE (APRESOLINE) 10 MG tablet Take 10 mg by mouth 3 (three) times daily as needed (elevated bp greater than 160).    . Lancets MISC Use as directed twice per day E11.9 200 each 3  . magnesium oxide (MAG-OX) 400 MG tablet Take 800 mg by mouth 2 (two) times daily.     . mupirocin ointment (BACTROBAN) 2 % Apply 1 application  topically 2 (two) times daily. 30 g 2  . nitroGLYCERIN (NITROSTAT) 0.4 MG SL tablet Place 1 tablet (0.4 mg total) under the tongue every 5 (five) minutes as needed for chest pain (x 3 doses). 30 tablet 3  . pantoprazole (PROTONIX) 40 MG tablet Take 1  tablet (40 mg total) by mouth daily. 90 tablet 3  . terazosin (HYTRIN) 1 MG capsule Take 1 mg by mouth at bedtime.  30 capsule 11  . vitamin C (ASCORBIC ACID) 500 MG tablet Take 500 mg by mouth daily with breakfast.     . Zinc 22.5 MG TABS Take 22.5 mg by mouth every other day.     No current facility-administered medications for this visit.    REVIEW OF SYSTEMS:  [X]  denotes positive finding, [ ]  denotes negative finding Cardiac  Comments:  Chest pain or chest pressure:    Shortness of breath upon exertion:    Short of breath when lying flat:    Irregular heart rhythm: x       Vascular    Pain in calf, thigh, or hip brought on by ambulation:    Pain in feet at night that wakes you up from your sleep:     Blood clot in your veins:    Leg swelling:         Pulmonary    Oxygen at home:    Productive cough:     Wheezing:         Neurologic    Sudden weakness in arms or legs:     Sudden numbness in arms or legs:     Sudden onset of difficulty speaking or slurred speech:    Temporary loss of vision in one eye:     Problems with dizziness:         Gastrointestinal    Blood in stool:     Vomited blood:         Genitourinary    Burning when urinating:     Blood in urine:        Psychiatric    Major depression:         Hematologic    Bleeding problems:    Problems with blood clotting too easily:        Skin    Rashes or ulcers:        Constitutional    Fever or chills:     PHYSICAL EXAM:   Vitals:   10/23/20 1247 10/23/20 1251  BP: 135/85 128/71  Pulse: 89 89  Resp: 18   Temp: 98.2 F (36.8 C)   TempSrc: Temporal   SpO2: 98%   Weight: 220 lb (99.8 kg)   Height: 6\' 2"  (1.88 m)     GENERAL: The patient is a  well-nourished male, in no acute distress. The vital signs are documented above. CARDIAC: There is a regular rate and rhythm.  VASCULAR: I do not detect carotid bruits. He has palpable femoral pulses bilaterally.  He has a palpable left posterior tibial pulse.  I cannot palpate a posterior tibial pulse on the right although both feet are warm and well perfused. He has no significant lower extremity swelling. PULMONARY: There is good air exchange bilaterally without wheezing or rales. ABDOMEN: Soft and non-tender with normal pitched bowel sounds.  I cannot palpate his small aneurysm. MUSCULOSKELETAL: There are no major deformities or cyanosis. NEUROLOGIC: No focal weakness or paresthesias are detected. SKIN: There are no ulcers or rashes noted. PSYCHIATRIC: The patient has a normal affect.  DATA:    CAROTID DUPLEX: I have independently interpreted his carotid duplex scan today.  On the right side he has a less than 39% stenosis.  The right carotid endarterectomy site is widely patent.  The right vertebral artery is patent with antegrade flow.  On the left side he has a 60 to 79% stenosis.  The left vertebral artery is patent with antegrade flow.  Deitra Mayo [facility] [phone]

## 2020-10-30 ENCOUNTER — Ambulatory Visit: Payer: Medicare Other

## 2020-10-31 ENCOUNTER — Ambulatory Visit (INDEPENDENT_AMBULATORY_CARE_PROVIDER_SITE_OTHER): Payer: Medicare Other | Admitting: Podiatry

## 2020-10-31 ENCOUNTER — Other Ambulatory Visit: Payer: Self-pay

## 2020-10-31 DIAGNOSIS — M79674 Pain in right toe(s): Secondary | ICD-10-CM | POA: Diagnosis not present

## 2020-10-31 DIAGNOSIS — L84 Corns and callosities: Secondary | ICD-10-CM

## 2020-10-31 DIAGNOSIS — D689 Coagulation defect, unspecified: Secondary | ICD-10-CM

## 2020-10-31 DIAGNOSIS — E1140 Type 2 diabetes mellitus with diabetic neuropathy, unspecified: Secondary | ICD-10-CM | POA: Diagnosis not present

## 2020-10-31 DIAGNOSIS — M79675 Pain in left toe(s): Secondary | ICD-10-CM

## 2020-10-31 DIAGNOSIS — B351 Tinea unguium: Secondary | ICD-10-CM | POA: Diagnosis not present

## 2020-11-04 NOTE — Progress Notes (Signed)
Patient ID: KYRO JOSWICK, male   DOB: [birth year], 79 y.o.   MRN: [id number]  Subjective: 79 y.o. returns the office today for painful, elongated, thickened toenails which he cannot trim himself and for calluses on the right foot.  No redness or swelling to either the nails or callus site. Denies any acute changes since last appointment and no new complaints today. Denies any systemic complaints such as fevers, chills, nausea, vomiting.   He is on Eliquis   Objective: AAO 3, NAD DP/PT pulses palpable, CRT less than 3 seconds Protective sensation decreased with Simms Weinstein monofilament Nails hypertrophic, dystrophic, elongated, brittle, discolored 10. There is tenderness overlying the nails 1-5 bilaterally. There is no surrounding erythema or drainage along the nail sites. There is minimal hyperkeratotic lesion right fifth metatarsal base and fifth metatarsal head. No ulceration, drainage or any signs of infection. No significant hyperkeratotic lesion right fifth metatarsal base.  No pain with calf compression, swelling, warmth, erythema.  Assessment: Patient presents with symptomatic onychomycosis; hyperkeratotic tissue  Plan: -Treatment options including alternatives, risks, complications were discussed -Nails sharply debrided 10 without complication/bleeding. -Hyperkeratotic tissue sharply debrided x 2 without any complications or bleeding.  -Discussed daily foot inspection. If there are any changes, to call the office immediately.  -Continue diabetic inserts, offloading.  Moisturizer to the hyperkeratotic lesions. -Follow-up in 3 months or sooner if any problems are to arise. In the meantime, encouraged to call the office with any questions, concerns, changes symptoms.  Celesta Gentile, DPM

## 2020-11-06 ENCOUNTER — Ambulatory Visit: Payer: Medicare Other | Admitting: Podiatry

## 2020-11-08 ENCOUNTER — Ambulatory Visit: Payer: Medicare Other

## 2020-11-18 DIAGNOSIS — R809 Proteinuria, unspecified: Secondary | ICD-10-CM | POA: Diagnosis not present

## 2020-11-18 DIAGNOSIS — D631 Anemia in chronic kidney disease: Secondary | ICD-10-CM | POA: Diagnosis not present

## 2020-11-18 DIAGNOSIS — I129 Hypertensive chronic kidney disease with stage 1 through stage 4 chronic kidney disease, or unspecified chronic kidney disease: Secondary | ICD-10-CM | POA: Diagnosis not present

## 2020-11-18 DIAGNOSIS — N189 Chronic kidney disease, unspecified: Secondary | ICD-10-CM | POA: Diagnosis not present

## 2020-11-18 DIAGNOSIS — E1122 Type 2 diabetes mellitus with diabetic chronic kidney disease: Secondary | ICD-10-CM | POA: Diagnosis not present

## 2020-11-18 DIAGNOSIS — N1832 Chronic kidney disease, stage 3b: Secondary | ICD-10-CM | POA: Diagnosis not present

## 2020-11-26 DIAGNOSIS — Z23 Encounter for immunization: Secondary | ICD-10-CM | POA: Diagnosis not present

## 2020-12-16 ENCOUNTER — Ambulatory Visit (INDEPENDENT_AMBULATORY_CARE_PROVIDER_SITE_OTHER): Payer: Medicare Other

## 2020-12-16 DIAGNOSIS — I495 Sick sinus syndrome: Secondary | ICD-10-CM

## 2020-12-17 LAB — CUP PACEART REMOTE DEVICE CHECK
Battery Remaining Longevity: 139 mo
Battery Remaining Percentage: 95.5 %
Battery Voltage: 3.02 V
Brady Statistic RV Percent Paced: 3.4 %
Date Time Interrogation Session: 20220117020012
Implantable Lead Implant Date: 20210415
Implantable Lead Implant Date: 20210415
Implantable Lead Location: 753859
Implantable Lead Location: 753860
Implantable Pulse Generator Implant Date: 20210415
Lead Channel Impedance Value: 490 Ohm
Lead Channel Pacing Threshold Amplitude: 0.5 V
Lead Channel Pacing Threshold Pulse Width: 0.5 ms
Lead Channel Sensing Intrinsic Amplitude: 12 mV
Lead Channel Setting Pacing Amplitude: 2.5 V
Lead Channel Setting Pacing Pulse Width: 0.5 ms
Lead Channel Setting Sensing Sensitivity: 2 mV
Pulse Gen Model: 2272
Pulse Gen Serial Number: 3813712

## 2020-12-28 NOTE — Progress Notes (Signed)
Remote pacemaker transmission.   

## 2021-01-01 ENCOUNTER — Other Ambulatory Visit: Payer: Self-pay

## 2021-01-01 ENCOUNTER — Encounter: Payer: Self-pay | Admitting: Internal Medicine

## 2021-01-01 ENCOUNTER — Ambulatory Visit (INDEPENDENT_AMBULATORY_CARE_PROVIDER_SITE_OTHER): Payer: Medicare Other | Admitting: Internal Medicine

## 2021-01-01 VITALS — BP 128/70 | HR 94 | Ht 74.0 in | Wt 218.8 lb

## 2021-01-01 DIAGNOSIS — I5032 Chronic diastolic (congestive) heart failure: Secondary | ICD-10-CM | POA: Diagnosis not present

## 2021-01-01 DIAGNOSIS — I2510 Atherosclerotic heart disease of native coronary artery without angina pectoris: Secondary | ICD-10-CM | POA: Diagnosis not present

## 2021-01-01 DIAGNOSIS — Z950 Presence of cardiac pacemaker: Secondary | ICD-10-CM

## 2021-01-01 DIAGNOSIS — I495 Sick sinus syndrome: Secondary | ICD-10-CM | POA: Diagnosis not present

## 2021-01-01 DIAGNOSIS — I4819 Other persistent atrial fibrillation: Secondary | ICD-10-CM

## 2021-01-01 NOTE — Patient Instructions (Signed)
Medication Instructions:  Your physician recommends that you continue on your current medications as directed. Please refer to the Current Medication list given to you today.  Labwork: None ordered.  Testing/Procedures: None ordered.  Follow-Up: Your physician wants you to follow-up in: one year with Gregg Taylor, MD or one of the following Advanced Practice Providers on your designated Care Team:    Amber Seiler, NP  Renee Ursuy, PA-C  Michael "Andy" Tillery, PA-C  Remote monitoring is used to monitor your Pacemaker from home. This monitoring reduces the number of office visits required to check your device to one time per year. It allows us to keep an eye on the functioning of your device to ensure it is working properly. You are scheduled for a device check from home on 03/17/2021. You may send your transmission at any time that day. If you have a wireless device, the transmission will be sent automatically. After your physician reviews your transmission, you will receive a postcard with your next transmission date.  Any Other Special Instructions Will Be Listed Below (If Applicable).  If you need a refill on your cardiac medications before your next appointment, please call your pharmacy.       

## 2021-01-01 NOTE — Progress Notes (Signed)
HPI Mr. Koeppen returns today after developing recurrent atrial fib despite dofetilide. He was placed on amiodarone  After presenting with atrial fib and a RVR. He then developed NSR and has had sinus bradycardia. He had his amio held and then reverted back to atrial fib with a RVR. He presents today for followup.  His rates are better but he is still in atrial fib.  He states that he feels fairly well altogether. No syncope.  Allergies  Allergen Reactions  . Lisinopril Swelling and Other (See Comments)    Angioedema.   . Losartan Swelling and Other (See Comments)    Lips swell Angioedema   . Nifedipine Swelling and Other (See Comments)    Sugar increase  . Triamterene Swelling and Other (See Comments)    Face swells, no breathing impairment  . Hydralazine Hcl Other (See Comments)    Fatigue; poor appetite  . Losartan Potassium-Hctz Swelling and Other (See Comments)  . Amoxicillin Rash and Other (See Comments)    Did it involve swelling of the face/tongue/throat, SOB, or low BP? No Did it involve sudden or severe rash/hives, skin peeling, or any reaction on the inside of your mouth or nose? No Did you need to seek medical attention at a hospital or doctor's office? No When did it last happen?10 + years If all above answers are "NO", may proceed with cephalosporin use.    . Ampicillin Rash and Other (See Comments)    Did it involve swelling of the face/tongue/throat, SOB, or low BP? No Did it involve sudden or severe rash/hives, skin peeling, or any reaction on the inside of your mouth or nose? No Did you need to seek medical attention at a hospital or doctor's office? No When did it last happen?10 + years If all above answers are "NO", may proceed with cephalosporin use.   . Carvedilol Other (See Comments)    Low heart rate      Current Outpatient Medications  Medication Sig Dispense Refill  . apixaban (ELIQUIS) 2.5 MG TABS tablet Take 1 tablet (2.5 mg  total) by mouth 2 (two) times daily. (0800 & 2000) 180 tablet 3  . aspirin EC 81 MG tablet Take 1 tablet (81 mg total) by mouth daily. 90 tablet 3  . atorvastatin (LIPITOR) 40 MG tablet Take 40 mg by mouth at bedtime.     . carvedilol (COREG) 3.125 MG tablet Take 1 tablet (3.125 mg total) by mouth 2 (two) times daily. 180 tablet 3  . cholecalciferol (VITAMIN D3) 25 MCG (1000 UNIT) tablet Take 1,000 Units by mouth daily.    [name] glucose blood test strip Accu-Check Guide Strips. Dx E11.9 Use to test blood glucose level 1 time a day. 100 each 11  . hydrALAZINE (APRESOLINE) 10 MG tablet Take 10 mg by mouth 3 (three) times daily as needed (elevated bp greater than 160).    . Lancets MISC Use as directed twice per day E11.9 200 each 3  . magnesium oxide (MAG-OX) 400 MG tablet Take 800 mg by mouth 2 (two) times daily.     . mupirocin ointment (BACTROBAN) 2 % Apply 1 application topically 2 (two) times daily. 30 g 2  . nitroGLYCERIN (NITROSTAT) 0.4 MG SL tablet Place 1 tablet (0.4 mg total) under the tongue every 5 (five) minutes as needed for chest pain (x 3 doses). 30 tablet 3  . pantoprazole (PROTONIX) 40 MG tablet Take 1 tablet (40 mg total) by mouth daily. 90 tablet  3  . terazosin (HYTRIN) 1 MG capsule Take 1 mg by mouth at bedtime.  30 capsule 11  . vitamin C (ASCORBIC ACID) 500 MG tablet Take 500 mg by mouth daily with breakfast.     . Zinc 22.5 MG TABS Take 22.5 mg by mouth every other day.     No current facility-administered medications for this visit.     Past Medical History:  Diagnosis Date  . AAA (abdominal aortic aneurysm) ([place])   . Arthritis   . Cancer (HCC)    skin & squamous cell  . Carotid artery disease ([place])    a. s/p R CEA 12/2015.  [name] CKD (chronic kidney disease), stage III ([place])    a. per historical labs.  . Coronary artery disease    a. s/p CABG 2004.  . Diabetes mellitus without complication ([place])   . GERD (gastroesophageal reflux disease)   . Hyperlipidemia   .  Hypertension   . Medication intolerance Multiple   . PAF (paroxysmal atrial fibrillation) ([place])   . Pneumonia   . Sinus bradycardia    a. baseline HR 50s-60s, also h/o bradycardia on metoprolol and carvedilol    ROS:   All systems reviewed and negative except as noted in the HPI.   Past Surgical History:  Procedure Laterality Date  . ANKLE SURGERY     right fused  . BACK SURGERY     low back   . CARDIOVERSION N/A 02/14/2020   Procedure: CARDIOVERSION;  Surgeon: Geralynn Rile, MD;  Location: [place];  Service: Cardiovascular;  Laterality: N/A;  . CARDIOVERSION N/A 02/26/2020   Procedure: CARDIOVERSION;  Surgeon: [name], MD;  Location: [facility] ENDOSCOPY;  Service: Cardiovascular;  Laterality: N/A;  . CARDIOVERSION N/A 04/12/2020   Procedure: CARDIOVERSION;  Surgeon: Skeet Latch, MD;  Location: [facility] ENDOSCOPY;  Service: Cardiovascular;  Laterality: N/A;  . CHOLECYSTECTOMY N/A 12/07/2013   Procedure: LAPAROSCOPIC CHOLECYSTECTOMY ;  Surgeon: [name], MD;  Location: [place];  Service: General;  Laterality: N/A;  . COLONOSCOPY    . CORONARY ARTERY BYPASS GRAFT  2004   [place]  . CORONARY STENT INTERVENTION N/A 05/21/2017   Procedure: Coronary Stent Intervention;  Surgeon: [name], MD;  Location: [place] CV LAB;  Service: Cardiovascular;  Laterality: N/A;  . ENDARTERECTOMY Right 12/10/2015   Procedure: Right Carotid ENDARTERECTOMY;  Surgeon: Conrad Moscow, MD;  Location: [place];  Service: Vascular;  Laterality: Right;  . EXPLORATION POST OPERATIVE OPEN HEART    . FINGER SURGERY     skin graft on rt index  . FINGER SURGERY Right    right index finger.  [name] HERNIA REPAIR Right    RIH  . JOINT REPLACEMENT    . KNEE SURGERY     replacement on both knees  . LEFT HEART CATH AND CORS/GRAFTS ANGIOGRAPHY N/A 05/21/2017   Procedure: Left Heart Cath and Cors/Grafts Angiography;  Surgeon: [name], MD;  Location: [place] CV LAB;  Service:  Cardiovascular;  Laterality: N/A;  . LEFT HEART CATH AND CORS/GRAFTS ANGIOGRAPHY N/A 05/20/2020   Procedure: LEFT HEART CATH AND CORS/GRAFTS ANGIOGRAPHY;  Surgeon: [name], MD;  Location: [place] CV LAB;  Service: Cardiovascular;  Laterality: N/A;  . PACEMAKER IMPLANT N/A 03/14/2020   Procedure: PACEMAKER IMPLANT;  Surgeon: Evans Lance, MD;  Location: [place] CV LAB;  Service: Cardiovascular;  Laterality: N/A;  . PROSTATE SURGERY    . PVC ABLATION N/A 10/27/2017   Procedure: PVC ABLATION;  Surgeon: Evans Lance, MD;  Location: [place] CV LAB;  Service: Cardiovascular;  Laterality: N/A;  . TONSILLECTOMY    . URINARY SURGERY     scar tissue     Family History  Problem Relation Age of Onset  . Stroke Mother   . Heart disease Mother   . Heart disease Father   . Hypertension Father   . Hyperlipidemia Father   . Cancer Sister        breast  and skin  . Diabetes Sister   . Hypertension Sister   . Diabetes Brother   . Heart disease Brother   . Hyperlipidemia Brother   . Hypertension Brother   . Early death Neg Hx      Social History   Socioeconomic History  . Marital status: Married    Spouse name: Not on file  . Number of children: 2  . Years of education: Not on file  . Highest education level: Not on file  Occupational History  . Occupation: retired  Tobacco Use  . Smoking status: Former Smoker    Packs/day: 1.00    Years: 13.00    Pack years: 13.00    Quit date: 11/30/1970    Years since quitting: 50.1  . Smokeless tobacco: Never Used  Vaping Use  . Vaping Use: Never used  Substance and Sexual Activity  . Alcohol use: No    Alcohol/week: 0.0 standard drinks  . Drug use: No  . Sexual activity: Yes  Other Topics Concern  . Not on file  Social History Narrative  . Not on file   Social Determinants of Health   Financial Resource Strain: Not on file  Food Insecurity: Not on file  Transportation Needs: Not on file  Physical Activity:  Not on file  Stress: Not on file  Social Connections: Not on file  Intimate Partner Violence: Not on file     BP 128/70   Pulse 94   Ht 6\' 2"  (1.88 m)   Wt 218 lb 12.8 oz (99.2 kg)   SpO2 99%   BMI 28.09 kg/m   Physical Exam:  Well appearing NAD HEENT: Unremarkable Neck:  No JVD, no thyromegally Lymphatics:  No adenopathy Back:  No CVA tenderness Lungs:  Clear with no wheezes HEART:  IRegular rate rhythm, no murmurs, no rubs, no clicks Abd:  soft, positive bowel sounds, no organomegally, no rebound, no guarding Ext:  2 plus pulses, no edema, no cyanosis, no clubbing Skin:  No rashes no nodules Neuro:  CN II through XII intact, motor grossly intact  EKG - atrial fib with RBBB and a controlled VR  DEVICE  Normal device function.  See PaceArt for details.   Assess/Plan: 1. Atrial fib - his VR is well controlled. He will continue his current meds. 2. Coags - he has not had bleeding on eliquis. 3. HTN - his bp is controlled. 4. PVC's - he is s/p ablation and doing well. 5. PPM - his [employer] PPM is programmed VVIR. We will follow.  Carleene Overlie Borna Wessinger,MD

## 2021-01-10 ENCOUNTER — Telehealth (INDEPENDENT_AMBULATORY_CARE_PROVIDER_SITE_OTHER): Payer: Medicare Other | Admitting: Family

## 2021-01-10 DIAGNOSIS — R059 Cough, unspecified: Secondary | ICD-10-CM | POA: Diagnosis not present

## 2021-01-10 MED ORDER — BENZONATATE 100 MG PO CAPS
100.0000 mg | ORAL_CAPSULE | Freq: Three times a day (TID) | ORAL | 0 refills | Status: DC | PRN
Start: 2021-01-10 — End: 2021-04-16

## 2021-01-10 MED ORDER — AZITHROMYCIN 250 MG PO TABS: ORAL_TABLET | ORAL | 0 refills | Status: DC

## 2021-01-10 NOTE — Progress Notes (Signed)
Thomas Marshall is a 80 y.o. male with the following history as recorded in EpicCare:  Patient Active Problem List   Diagnosis Date Noted  . Pacemaker 01/01/2021  . Chronic diastolic heart failure ([place]) 01/01/2021  . Aortic atherosclerosis ([place]) 10/16/2020  . Dyspnea 09/08/2020  . Anemia 09/08/2020  . Pain due to onychomycosis of toenails of both feet 05/01/2020  . Sinus node dysfunction ([place]) 03/14/2020  . Secondary hypercoagulable state ([place]) 01/25/2020  . Hypocalcemia 01/09/2020  . Spinal stenosis in cervical region 01/09/2020  . Acute bursitis of right shoulder 12/20/2019  . Subconjunctival hemorrhage 12/01/2019  . TIA (transient ischemic attack) 11/20/2019  . Pancytopenia ([place]) 11/20/2019  . Left sided numbness   . Coagulation disorder ([place]) 10/31/2019  . Arthritis of right sacroiliac joint 07/21/2019  . Right lower quadrant abdominal swelling 10/12/2018  . Cough 12/09/2017  . PVCs (premature ventricular contractions) 10/27/2017  . PVC's (premature ventricular contractions) 09/05/2017  . Bradycardia 09/04/2017  . Cellulitis of right knee 08/11/2017  . Degenerative disc disease, lumbar 07/26/2017  . Atypical angina ([place])   . Right low back pain 05/18/2017  . Rash 04/08/2017  . Paroxysmal atrial fibrillation ([place]) 09/27/2016  . Persistent atrial fibrillation ([place])   . Erectile dysfunction 09/16/2016  . Hypomagnesemia 05/22/2016  . Essential hypertension 04/21/2016  . CAD (coronary artery disease) 03/02/2016  . Neck abscess 01/10/2016  . Carotid artery disease without cerebral infarction ([place]) 11/15/2015  . Right facial swelling 08/06/2015  . Benign prostatic hyperplasia 08/06/2015  . Abscess of left thigh 07/16/2015  . Cellulitis and abscess of leg 07/09/2015  . CKD (chronic kidney disease) stage 3, GFR 30-59 ml/min (HCC) 05/14/2015  . Carotid stenosis 05/01/2015  . PVD (peripheral vascular disease) ([place]) 04/09/2015  . AAA (abdominal aortic aneurysm) ([place]) 04/09/2015   . S/P CABG x 4 2004 09/10/2014  . Long term current use of anticoagulant therapy 09/04/2013  . Hx of squamous cell carcinoma of skin   . Type 2 diabetes mellitus with renal manifestations ([place])   . GERD (gastroesophageal reflux disease)   . Hyperlipidemia     Current Outpatient Medications  Medication Sig Dispense Refill  . azithromycin (ZITHROMAX) 250 MG tablet 2 tabs po qd x 1 day; 1 tablet per day x 4 days; 6 tablet 0  . benzonatate (TESSALON) 100 MG capsule Take 1 capsule (100 mg total) by mouth 3 (three) times daily as needed. 20 capsule 0  . apixaban (ELIQUIS) 2.5 MG TABS tablet Take 1 tablet (2.5 mg total) by mouth 2 (two) times daily. (0800 & 2000) 180 tablet 3  . aspirin EC 81 MG tablet Take 1 tablet (81 mg total) by mouth daily. 90 tablet 3  . atorvastatin (LIPITOR) 40 MG tablet Take 40 mg by mouth at bedtime.     . carvedilol (COREG) 3.125 MG tablet Take 1 tablet (3.125 mg total) by mouth 2 (two) times daily. 180 tablet 3  . cholecalciferol (VITAMIN D3) 25 MCG (1000 UNIT) tablet Take 1,000 Units by mouth daily.    [name] glucose blood test strip Accu-Check Guide Strips. Dx E11.9 Use to test blood glucose level 1 time a day. 100 each 11  . hydrALAZINE (APRESOLINE) 10 MG tablet Take 10 mg by mouth 3 (three) times daily as needed (elevated bp greater than 160).    . Lancets MISC Use as directed twice per day E11.9 200 each 3  . magnesium oxide (MAG-OX) 400 MG tablet Take 800 mg by mouth 2 (two) times daily.     [name]  mupirocin ointment (BACTROBAN) 2 % Apply 1 application topically 2 (two) times daily. 30 g 2  . nitroGLYCERIN (NITROSTAT) 0.4 MG SL tablet Place 1 tablet (0.4 mg total) under the tongue every 5 (five) minutes as needed for chest pain (x 3 doses). 30 tablet 3  . pantoprazole (PROTONIX) 40 MG tablet Take 1 tablet (40 mg total) by mouth daily. 90 tablet 3  . terazosin (HYTRIN) 1 MG capsule Take 1 mg by mouth at bedtime.  30 capsule 11  . vitamin C (ASCORBIC ACID) 500 MG tablet Take  500 mg by mouth daily with breakfast.     . Zinc 22.5 MG TABS Take 22.5 mg by mouth every other day.     No current facility-administered medications for this visit.    Allergies: Lisinopril, Losartan, Nifedipine, Triamterene, Hydralazine hcl, Losartan potassium-hctz, Amoxicillin, Ampicillin, and Carvedilol  Past Medical History:  Diagnosis Date  . AAA (abdominal aortic aneurysm) ([place])   . Arthritis   . Cancer (HCC)    skin & squamous cell  . Carotid artery disease ([place])    a. s/p R CEA 12/2015.  [name] CKD (chronic kidney disease), stage III ([place])    a. per historical labs.  . Coronary artery disease    a. s/p CABG 2004.  . Diabetes mellitus without complication ([place])   . GERD (gastroesophageal reflux disease)   . Hyperlipidemia   . Hypertension   . Medication intolerance Multiple   . PAF (paroxysmal atrial fibrillation) ([place])   . Pneumonia   . Sinus bradycardia    a. baseline HR 50s-60s, also h/o bradycardia on metoprolol and carvedilol    Past Surgical History:  Procedure Laterality Date  . ANKLE SURGERY     right fused  . BACK SURGERY     low back   . CARDIOVERSION N/A 02/14/2020   Procedure: CARDIOVERSION;  Surgeon: Geralynn Rile, MD;  Location: [place];  Service: Cardiovascular;  Laterality: N/A;  . CARDIOVERSION N/A 02/26/2020   Procedure: CARDIOVERSION;  Surgeon: [name], MD;  Location: [facility] ENDOSCOPY;  Service: Cardiovascular;  Laterality: N/A;  . CARDIOVERSION N/A 04/12/2020   Procedure: CARDIOVERSION;  Surgeon: Skeet Latch, MD;  Location: [facility] ENDOSCOPY;  Service: Cardiovascular;  Laterality: N/A;  . CHOLECYSTECTOMY N/A 12/07/2013   Procedure: LAPAROSCOPIC CHOLECYSTECTOMY ;  Surgeon: [name], MD;  Location: [place];  Service: General;  Laterality: N/A;  . COLONOSCOPY    . CORONARY ARTERY BYPASS GRAFT  2004   [place]  . CORONARY STENT INTERVENTION N/A 05/21/2017   Procedure: Coronary Stent Intervention;  Surgeon: [name], MD;   Location: [place] CV LAB;  Service: Cardiovascular;  Laterality: N/A;  . ENDARTERECTOMY Right 12/10/2015   Procedure: Right Carotid ENDARTERECTOMY;  Surgeon: Conrad [place], MD;  Location: [place];  Service: Vascular;  Laterality: Right;  . EXPLORATION POST OPERATIVE OPEN HEART    . FINGER SURGERY     skin graft on rt index  . FINGER SURGERY Right    right index finger.  [name] HERNIA REPAIR Right    RIH  . JOINT REPLACEMENT    . KNEE SURGERY     replacement on both knees  . LEFT HEART CATH AND CORS/GRAFTS ANGIOGRAPHY N/A 05/21/2017   Procedure: Left Heart Cath and Cors/Grafts Angiography;  Surgeon: [name], MD;  Location: [place] CV LAB;  Service: Cardiovascular;  Laterality: N/A;  . LEFT HEART CATH AND CORS/GRAFTS ANGIOGRAPHY N/A 05/20/2020   Procedure: LEFT HEART CATH AND CORS/GRAFTS ANGIOGRAPHY;  Surgeon: [name], MD;  Location: [place] CV LAB;  Service: Cardiovascular;  Laterality: N/A;  . PACEMAKER IMPLANT N/A 03/14/2020   Procedure: PACEMAKER IMPLANT;  Surgeon: Evans Lance, MD;  Location: [place] CV LAB;  Service: Cardiovascular;  Laterality: N/A;  . PROSTATE SURGERY    . PVC ABLATION N/A 10/27/2017   Procedure: PVC ABLATION;  Surgeon: Evans Lance, MD;  Location: [place] CV LAB;  Service: Cardiovascular;  Laterality: N/A;  . TONSILLECTOMY    . URINARY SURGERY     scar tissue    Family History  Problem Relation Age of Onset  . Stroke Mother   . Heart disease Mother   . Heart disease Father   . Hypertension Father   . Hyperlipidemia Father   . Cancer Sister        breast  and skin  . Diabetes Sister   . Hypertension Sister   . Diabetes Brother   . Heart disease Brother   . Hyperlipidemia Brother   . Hypertension Brother   . Early death Neg Hx     Social History   Tobacco Use  . Smoking status: Former Smoker    Packs/day: 1.00    Years: 13.00    Pack years: 13.00    Quit date: 11/30/1970    Years since quitting: 50.1  . Smokeless  tobacco: Never Used  Substance Use Topics  . Alcohol use: No    Alcohol/week: 0.0 standard drinks    Subjective:   I connected with [place] on 01/10/21 at  2:40 PM EST by a video enabled telemedicine application and verified that I am speaking with the correct person using two identifiers.   I discussed the limitations of evaluation and management by telemedicine and the availability of in person appointments. The patient expressed understanding and agreed to proceed. Provider in office/ patient is at home; provider and patient are only 2 people on video call.   Cough x 2 weeks; had negative COVID test 2 days ago; saw cardiologist last week and lungs were clear; no chest pain or shortness of breath; no fever or difficulty breathing; using OTC Coricidin with some benefit;     Objective:  There were no vitals filed for this visit.  General: Well developed, well nourished, in no acute distress  Head: Normocephalic and atraumatic  Lungs: Respirations unlabored;  Neurologic: Alert and oriented; speech intact; face symmetrical;   Assessment:  1. Cough     Plan:  Patient has strong voice on video call; will treat with Z-pak and Tessalon Perles; if symptoms persist, will need to get CXR next week;    No follow-ups on file.  No orders of the defined types were placed in this encounter.   Requested Prescriptions   Signed Prescriptions Disp Refills  . azithromycin (ZITHROMAX) 250 MG tablet 6 tablet 0    Sig: 2 tabs po qd x 1 day; 1 tablet per day x 4 days;  . benzonatate (TESSALON) 100 MG capsule 20 capsule 0    Sig: Take 1 capsule (100 mg total) by mouth 3 (three) times daily as needed.

## 2021-01-14 DIAGNOSIS — L570 Actinic keratosis: Secondary | ICD-10-CM | POA: Diagnosis not present

## 2021-01-14 DIAGNOSIS — C44329 Squamous cell carcinoma of skin of other parts of face: Secondary | ICD-10-CM | POA: Diagnosis not present

## 2021-01-18 ENCOUNTER — Other Ambulatory Visit: Payer: Self-pay | Admitting: Internal Medicine

## 2021-01-30 ENCOUNTER — Ambulatory Visit (INDEPENDENT_AMBULATORY_CARE_PROVIDER_SITE_OTHER): Payer: Medicare Other | Admitting: Podiatry

## 2021-01-30 ENCOUNTER — Encounter: Payer: Self-pay | Admitting: Podiatrist

## 2021-01-30 ENCOUNTER — Other Ambulatory Visit: Payer: Self-pay

## 2021-01-30 DIAGNOSIS — E1140 Type 2 diabetes mellitus with diabetic neuropathy, unspecified: Secondary | ICD-10-CM | POA: Diagnosis not present

## 2021-01-30 DIAGNOSIS — L84 Corns and callosities: Secondary | ICD-10-CM | POA: Diagnosis not present

## 2021-01-30 DIAGNOSIS — M79675 Pain in left toe(s): Secondary | ICD-10-CM | POA: Diagnosis not present

## 2021-01-30 DIAGNOSIS — Z7901 Long term (current) use of anticoagulants: Secondary | ICD-10-CM | POA: Diagnosis not present

## 2021-01-30 DIAGNOSIS — B351 Tinea unguium: Secondary | ICD-10-CM

## 2021-01-30 DIAGNOSIS — M79674 Pain in right toe(s): Secondary | ICD-10-CM | POA: Diagnosis not present

## 2021-01-30 NOTE — Progress Notes (Unsigned)
DIABETIC SHOES CASTING:  Patient presented for foam casting for 3 pr custom diabetic shoe inserts-  Patient is measured with a Brannok device to be a size 12 XWide  Diabetic shoes are chosen from the [employer].  The shoes chosen are [id number]  In the past, additional padding was placed by [facility] on the lateral side of the right insert in order to offload the fifth metatarsal base and fifth metatarsal head.  He would like that accomodation in the future as well.   The Patient's physician managing his diabetes is Dr. Jenny Reichmann.   The patient will be contacted when the shoes and insert are ready to be picked up.

## 2021-02-04 NOTE — Progress Notes (Signed)
Patient ID: Thomas Marshall, male   DOB: 1941-01-31, 80 y.o.   MRN: [id number]  Subjective: 80 y.o. returns the office today for painful, elongated, thickened toenails which he cannot trim himself and for calluses on the right foot.  No open lesions he reports. Denies any acute changes since last appointment and no new complaints today. Denies any systemic complaints such as fevers, chills, nausea, vomiting.   He is on Eliquis   Objective: AAO 3, NAD DP/PT pulses palpable, CRT less than 3 seconds Protective sensation decreased with Simms Weinstein monofilament Nails hypertrophic, dystrophic, elongated, brittle, discolored 10. There is tenderness overlying the nails 1-5 bilaterally. There is no surrounding erythema or drainage along the nail sites. There is minimal hyperkeratotic lesion right fifth metatarsal base and fifth metatarsal head. No ulceration, drainage or any signs of infection. No significant hyperkeratotic lesion right fifth metatarsal base.  No pain with calf compression, swelling, warmth, erythema.  Assessment: Patient presents with symptomatic onychomycosis; hyperkeratotic tissue  Plan: -Treatment options including alternatives, risks, complications were discussed -Nails sharply debrided 10 without complication/bleeding. -Hyperkeratotic tissue sharply debrided x 2 without any complications or bleeding. Continue moisturizer and offloading.  -Discussed daily foot inspection. If there are any changes, to call the office immediately.  -Follow-up in 3 months or sooner if any problems are to arise. In the meantime, encouraged to call the office with any questions, concerns, changes symptoms.  Celesta Gentile, DPM

## 2021-02-11 ENCOUNTER — Telehealth (INDEPENDENT_AMBULATORY_CARE_PROVIDER_SITE_OTHER): Payer: Medicare Other | Admitting: Family

## 2021-02-11 DIAGNOSIS — R42 Dizziness and giddiness: Secondary | ICD-10-CM | POA: Diagnosis not present

## 2021-02-11 MED ORDER — MECLIZINE HCL 25 MG PO TABS: 25.0000 mg | ORAL_TABLET | Freq: Three times a day (TID) | ORAL | 0 refills | Status: AC | PRN

## 2021-02-11 NOTE — Progress Notes (Signed)
Thomas Marshall is a 80 y.o. male with the following history as recorded in EpicCare:  Patient Active Problem List   Diagnosis Date Noted  . Pacemaker 01/01/2021  . Chronic diastolic heart failure ([place]) 01/01/2021  . Aortic atherosclerosis ([place]) 10/16/2020  . Dyspnea 09/08/2020  . Anemia 09/08/2020  . Pain due to onychomycosis of toenails of both feet 05/01/2020  . Sinus node dysfunction ([place]) 03/14/2020  . Secondary hypercoagulable state ([place]) 01/25/2020  . Hypocalcemia 01/09/2020  . Spinal stenosis in cervical region 01/09/2020  . Acute bursitis of right shoulder 12/20/2019  . Subconjunctival hemorrhage 12/01/2019  . TIA (transient ischemic attack) 11/20/2019  . Pancytopenia ([place]) 11/20/2019  . Left sided numbness   . Coagulation disorder ([place]) 10/31/2019  . Arthritis of right sacroiliac joint 07/21/2019  . Right lower quadrant abdominal swelling 10/12/2018  . Cough 12/09/2017  . PVCs (premature ventricular contractions) 10/27/2017  . PVC's (premature ventricular contractions) 09/05/2017  . Bradycardia 09/04/2017  . Cellulitis of right knee 08/11/2017  . Degenerative disc disease, lumbar 07/26/2017  . Atypical angina ([place])   . Right low back pain 05/18/2017  . Rash 04/08/2017  . Paroxysmal atrial fibrillation ([place]) 09/27/2016  . Persistent atrial fibrillation ([place])   . Erectile dysfunction 09/16/2016  . Hypomagnesemia 05/22/2016  . Essential hypertension 04/21/2016  . CAD (coronary artery disease) 03/02/2016  . Neck abscess 01/10/2016  . Carotid artery disease without cerebral infarction ([place]) 11/15/2015  . Right facial swelling 08/06/2015  . Benign prostatic hyperplasia 08/06/2015  . Abscess of left thigh 07/16/2015  . Cellulitis and abscess of leg 07/09/2015  . CKD (chronic kidney disease) stage 3, GFR 30-59 ml/min (HCC) 05/14/2015  . Carotid stenosis 05/01/2015  . PVD (peripheral vascular disease) ([place]) 04/09/2015  . AAA (abdominal aortic aneurysm) ([place]) 04/09/2015   . S/P CABG x 4 2004 09/10/2014  . Long term current use of anticoagulant therapy 09/04/2013  . Hx of squamous cell carcinoma of skin   . Type 2 diabetes mellitus with renal manifestations ([place])   . GERD (gastroesophageal reflux disease)   . Hyperlipidemia     Current Outpatient Medications  Medication Sig Dispense Refill  . meclizine (ANTIVERT) 25 MG tablet Take 1 tablet (25 mg total) by mouth 3 (three) times daily as needed for dizziness. 30 tablet 0  . Accu-Chek Softclix Lancets lancets USE 1 TWICE DAILY USE AS DIRECTED E11.9 100 each 3  . apixaban (ELIQUIS) 2.5 MG TABS tablet Take 1 tablet (2.5 mg total) by mouth 2 (two) times daily. (0800 & 2000) 180 tablet 3  . aspirin EC 81 MG tablet Take 1 tablet (81 mg total) by mouth daily. 90 tablet 3  . atorvastatin (LIPITOR) 40 MG tablet Take 40 mg by mouth at bedtime.     . benzonatate (TESSALON) 100 MG capsule Take 1 capsule (100 mg total) by mouth 3 (three) times daily as needed. 20 capsule 0  . carvedilol (COREG) 3.125 MG tablet Take 1 tablet (3.125 mg total) by mouth 2 (two) times daily. 180 tablet 3  . cholecalciferol (VITAMIN D3) 25 MCG (1000 UNIT) tablet Take 1,000 Units by mouth daily.    [name] glucose blood test strip Accu-Check Guide Strips. Dx E11.9 Use to test blood glucose level 1 time a day. 100 each 11  . hydrALAZINE (APRESOLINE) 10 MG tablet Take 10 mg by mouth 3 (three) times daily as needed (elevated bp greater than 160).    . magnesium oxide (MAG-OX) 400 MG tablet Take 800 mg by mouth  2 (two) times daily.     . mupirocin ointment (BACTROBAN) 2 % Apply 1 application topically 2 (two) times daily. 30 g 2  . nitroGLYCERIN (NITROSTAT) 0.4 MG SL tablet Place 1 tablet (0.4 mg total) under the tongue every 5 (five) minutes as needed for chest pain (x 3 doses). 30 tablet 3  . pantoprazole (PROTONIX) 40 MG tablet Take 1 tablet (40 mg total) by mouth daily. 90 tablet 3  . terazosin (HYTRIN) 1 MG capsule Take 1 mg by mouth at bedtime.  30  capsule 11  . vitamin C (ASCORBIC ACID) 500 MG tablet Take 500 mg by mouth daily with breakfast.     . Zinc 22.5 MG TABS Take 22.5 mg by mouth every other day.     No current facility-administered medications for this visit.    Allergies: Lisinopril, Losartan, Nifedipine, Triamterene, Hydralazine hcl, Losartan potassium-hctz, Amoxicillin, Ampicillin, and Carvedilol  Past Medical History:  Diagnosis Date  . AAA (abdominal aortic aneurysm) ([place])   . Arthritis   . Cancer (HCC)    skin & squamous cell  . Carotid artery disease ([place])    a. s/p R CEA 12/2015.  [name] CKD (chronic kidney disease), stage III ([place])    a. per historical labs.  . Coronary artery disease    a. s/p CABG 2004.  . Diabetes mellitus without complication ([place])   . GERD (gastroesophageal reflux disease)   . Hyperlipidemia   . Hypertension   . Medication intolerance Multiple   . PAF (paroxysmal atrial fibrillation) ([place])   . Pneumonia   . Sinus bradycardia    a. baseline HR 50s-60s, also h/o bradycardia on metoprolol and carvedilol    Past Surgical History:  Procedure Laterality Date  . ANKLE SURGERY     right fused  . BACK SURGERY     low back   . CARDIOVERSION N/A 02/14/2020   Procedure: CARDIOVERSION;  Surgeon: Geralynn Rile, MD;  Location: [facility];  Service: Cardiovascular;  Laterality: N/A;  . CARDIOVERSION N/A 02/26/2020   Procedure: CARDIOVERSION;  Surgeon: [name], MD;  Location: [facility] ENDOSCOPY;  Service: Cardiovascular;  Laterality: N/A;  . CARDIOVERSION N/A 04/12/2020   Procedure: CARDIOVERSION;  Surgeon: Skeet Latch, MD;  Location: [facility] ENDOSCOPY;  Service: Cardiovascular;  Laterality: N/A;  . CHOLECYSTECTOMY N/A 12/07/2013   Procedure: LAPAROSCOPIC CHOLECYSTECTOMY ;  Surgeon: [name], MD;  Location: [facility];  Service: General;  Laterality: N/A;  . COLONOSCOPY    . CORONARY ARTERY BYPASS GRAFT  2004   [place]  . CORONARY STENT INTERVENTION N/A 05/21/2017   Procedure:  Coronary Stent Intervention;  Surgeon: [name], MD;  Location: [place] CV LAB;  Service: Cardiovascular;  Laterality: N/A;  . ENDARTERECTOMY Right 12/10/2015   Procedure: Right Carotid ENDARTERECTOMY;  Surgeon: Conrad Denton, MD;  Location: [facility];  Service: Vascular;  Laterality: Right;  . EXPLORATION POST OPERATIVE OPEN HEART    . FINGER SURGERY     skin graft on rt index  . FINGER SURGERY Right    right index finger.  [name] HERNIA REPAIR Right    [facility]  . JOINT REPLACEMENT    . KNEE SURGERY     replacement on both knees  . LEFT HEART CATH AND CORS/GRAFTS ANGIOGRAPHY N/A 05/21/2017   Procedure: Left Heart Cath and Cors/Grafts Angiography;  Surgeon: [name], MD;  Location: [facility] CV LAB;  Service: Cardiovascular;  Laterality: N/A;  . LEFT HEART CATH AND CORS/GRAFTS ANGIOGRAPHY N/A 05/20/2020  Procedure: LEFT HEART CATH AND CORS/GRAFTS ANGIOGRAPHY;  Surgeon: [name], MD;  Location: [place] CV LAB;  Service: Cardiovascular;  Laterality: N/A;  . PACEMAKER IMPLANT N/A 03/14/2020   Procedure: PACEMAKER IMPLANT;  Surgeon: Evans Lance, MD;  Location: [facility] [place] CV LAB;  Service: Cardiovascular;  Laterality: N/A;  . PROSTATE SURGERY    . PVC ABLATION N/A 10/27/2017   Procedure: PVC ABLATION;  Surgeon: Evans Lance, MD;  Location: [facility] CV LAB;  Service: Cardiovascular;  Laterality: N/A;  . TONSILLECTOMY    . URINARY SURGERY     scar tissue    Family History  Problem Relation Age of Onset  . Stroke Mother   . Heart disease Mother   . Heart disease Father   . Hypertension Father   . Hyperlipidemia Father   . Cancer Sister        breast  and skin  . Diabetes Sister   . Hypertension Sister   . Diabetes Brother   . Heart disease Brother   . Hyperlipidemia Brother   . Hypertension Brother   . Early death Neg Hx     Social History   Tobacco Use  . Smoking status: Former Smoker    Packs/day: 1.00    Years: 13.00    Pack years: 13.00    Quit  date: 11/30/1970    Years since quitting: 50.2  . Smokeless tobacco: Never Used  Substance Use Topics  . Alcohol use: No    Alcohol/week: 0.0 standard drinks    Subjective:   I connected with [place] on 02/11/21 at 11:20 AM EDT by a telephone call and verified that I am speaking with the correct person using two identifiers.   I discussed the limitations of evaluation and management by telemedicine and the availability of in person appointments. The patient expressed understanding and agreed to proceed. Provider in office/ patient is at home; provider and patient are only 2 people on telephone call.   Patient had episode this morning where felt "Like the room was spinning around me when I woke up" and did have some accompanying nausea; notes he is actually feeling better by the time of the OV; Has been checking his blood pressure and pulse and have remained normal; has not received any notification that pacemaker detecting irregular rhythms;     Objective:  There were no vitals filed for this visit.  Lungs: Respirations unlabored;  Neurologic: Alert and oriented; speech intact;   Assessment:  1. Vertigo     Plan:  Patient did not want to do video visit today- preferred telephone call; will treat for suspect episode of vertigo with Antivert 25 mg tid prn; he understands to continue checking his blood pressure and pulse and see his PCP in the office if symptoms persist.  Time spent 12 minutes  No follow-ups on file.  No orders of the defined types were placed in this encounter.   Requested Prescriptions   Signed Prescriptions Disp Refills  . meclizine (ANTIVERT) 25 MG tablet 30 tablet 0    Sig: Take 1 tablet (25 mg total) by mouth 3 (three) times daily as needed for dizziness.

## 2021-02-11 NOTE — Patient Instructions (Signed)
Vertigo Vertigo is the feeling that you or the things around you are moving when they are not. This feeling can come and go at any time. Vertigo often goes away on its own. This condition can be dangerous if it happens when you are doing activities like driving or working with machines. Your doctor will do tests to find the cause of your vertigo. These tests will also help your doctor decide on the best treatment for you. Follow these instructions at home: Eating and drinking  Drink enough fluid to keep your pee (urine) pale yellow.  Do not drink alcohol.      Activity  Return to your normal activities as told by your doctor. Ask your doctor what activities are safe for you.  In the morning, first sit up on the side of the bed. When you feel okay, stand slowly while you hold onto something until you know that your balance is fine.  Move slowly. Avoid sudden body or head movements or certain positions, as told by your doctor.  Use a cane if you have trouble standing or walking.  Sit down right away if you feel dizzy.  Avoid doing any tasks or activities that can cause danger to you or others if you get dizzy.  Avoid bending down if you feel dizzy. Place items in your home so that they are easy for you to reach without leaning over.  Do not drive or use heavy machinery if you feel dizzy. General instructions  Take over-the-counter and prescription medicines only as told by your doctor.  Keep all follow-up visits as told by your doctor. This is important. Contact a doctor if:  Your medicine does not help your vertigo.  You have a fever.  Your problems get worse or you have new symptoms.  Your family or friends see changes in your behavior.  The feeling of being sick to your stomach gets worse.  Your vomiting gets worse.  You lose feeling (have numbness) in part of your body.  You feel prickling and tingling in a part of your body. Get help right away if:  You have  trouble moving or talking.  You are always dizzy.  You pass out (faint).  You get very bad headaches.  You feel weak in your hands, arms, or legs.  You have changes in your hearing.  You have changes in how you see (vision).  You get a stiff neck.  Bright light starts to bother you. Summary  Vertigo is the feeling that you or the things around you are moving when they are not.  Your doctor will do tests to find the cause of your vertigo.  You may be told to avoid some tasks, positions, or movements.  Contact a doctor if your medicine is not helping, or if you have a fever, new symptoms, or a change in behavior.  Get help right away if you get very bad headaches, or if you have changes in how you speak, hear, or see. This information is not intended to replace advice given to you by your health care provider. Make sure you discuss any questions you have with your health care provider. Document Revised: 10/10/2018 Document Reviewed: 10/10/2018 Elsevier Patient Education  2021 [employer].

## 2021-03-06 DIAGNOSIS — I4811 Longstanding persistent atrial fibrillation: Secondary | ICD-10-CM | POA: Diagnosis not present

## 2021-03-06 DIAGNOSIS — E1122 Type 2 diabetes mellitus with diabetic chronic kidney disease: Secondary | ICD-10-CM | POA: Diagnosis not present

## 2021-03-06 DIAGNOSIS — N1832 Chronic kidney disease, stage 3b: Secondary | ICD-10-CM | POA: Diagnosis not present

## 2021-03-06 DIAGNOSIS — I129 Hypertensive chronic kidney disease with stage 1 through stage 4 chronic kidney disease, or unspecified chronic kidney disease: Secondary | ICD-10-CM | POA: Diagnosis not present

## 2021-03-06 DIAGNOSIS — D631 Anemia in chronic kidney disease: Secondary | ICD-10-CM | POA: Diagnosis not present

## 2021-03-06 DIAGNOSIS — R809 Proteinuria, unspecified: Secondary | ICD-10-CM | POA: Diagnosis not present

## 2021-03-17 ENCOUNTER — Other Ambulatory Visit: Payer: Self-pay

## 2021-03-17 ENCOUNTER — Ambulatory Visit (INDEPENDENT_AMBULATORY_CARE_PROVIDER_SITE_OTHER): Payer: Medicare Other | Admitting: Podiatry

## 2021-03-17 ENCOUNTER — Other Ambulatory Visit: Payer: Medicare Other

## 2021-03-17 ENCOUNTER — Ambulatory Visit (INDEPENDENT_AMBULATORY_CARE_PROVIDER_SITE_OTHER): Payer: Medicare Other

## 2021-03-17 DIAGNOSIS — E1140 Type 2 diabetes mellitus with diabetic neuropathy, unspecified: Secondary | ICD-10-CM

## 2021-03-17 DIAGNOSIS — I442 Atrioventricular block, complete: Secondary | ICD-10-CM | POA: Diagnosis not present

## 2021-03-17 DIAGNOSIS — M79675 Pain in left toe(s): Secondary | ICD-10-CM

## 2021-03-17 DIAGNOSIS — M79674 Pain in right toe(s): Secondary | ICD-10-CM

## 2021-03-17 NOTE — Progress Notes (Signed)
The patient presented to the office today to pick up diabetic shoes and 3 pair of diabetic custom inserts.  1 pair of inserts were put in the shoes and the shoes were fitted to the patient. The patient stated that they were comfortable but felt loose and really wanted the brown shoes-  I stated to the patient that I would order V751 and a 12 wide.  Patient will be contacted when the shoes are ready for pick up

## 2021-03-18 LAB — CUP PACEART REMOTE DEVICE CHECK
Battery Remaining Longevity: 137 mo
Battery Remaining Percentage: 95.5 %
Battery Voltage: 3.02 V
Brady Statistic RV Percent Paced: 1 %
Date Time Interrogation Session: 20220418020014
Implantable Lead Implant Date: 20210415
Implantable Lead Implant Date: 20210415
Implantable Lead Location: 753859
Implantable Lead Location: 753860
Implantable Pulse Generator Implant Date: 20210415
Lead Channel Impedance Value: 560 Ohm
Lead Channel Pacing Threshold Amplitude: 0.5 V
Lead Channel Pacing Threshold Pulse Width: 0.5 ms
Lead Channel Sensing Intrinsic Amplitude: 12 mV
Lead Channel Setting Pacing Amplitude: 2.5 V
Lead Channel Setting Pacing Pulse Width: 0.5 ms
Lead Channel Setting Sensing Sensitivity: 2 mV
Pulse Gen Model: 2272
Pulse Gen Serial Number: 3813712

## 2021-03-20 DIAGNOSIS — L570 Actinic keratosis: Secondary | ICD-10-CM | POA: Diagnosis not present

## 2021-03-20 DIAGNOSIS — C44329 Squamous cell carcinoma of skin of other parts of face: Secondary | ICD-10-CM | POA: Diagnosis not present

## 2021-03-20 DIAGNOSIS — L821 Other seborrheic keratosis: Secondary | ICD-10-CM | POA: Diagnosis not present

## 2021-03-20 DIAGNOSIS — L578 Other skin changes due to chronic exposure to nonionizing radiation: Secondary | ICD-10-CM | POA: Diagnosis not present

## 2021-03-31 ENCOUNTER — Ambulatory Visit (INDEPENDENT_AMBULATORY_CARE_PROVIDER_SITE_OTHER): Payer: Medicare Other | Admitting: *Deleted

## 2021-03-31 ENCOUNTER — Other Ambulatory Visit: Payer: Self-pay

## 2021-03-31 DIAGNOSIS — E1140 Type 2 diabetes mellitus with diabetic neuropathy, unspecified: Secondary | ICD-10-CM | POA: Diagnosis not present

## 2021-03-31 DIAGNOSIS — L84 Corns and callosities: Secondary | ICD-10-CM

## 2021-03-31 NOTE — Progress Notes (Signed)
Patient presents today to pick up diabetic shoes and insoles.  Patient was dispensed 1 pair of diabetic shoes and 3 pairs of foam casted diabetic insoles. Fit was satisfactory. Instructions for break-in and wear was reviewed and a copy was given to the patient.   Re-appointment for regularly scheduled diabetic foot care visits or if they should experience any trouble with the shoes or insoles.  

## 2021-04-02 NOTE — Progress Notes (Signed)
Remote pacemaker transmission.   

## 2021-04-04 ENCOUNTER — Other Ambulatory Visit: Payer: Self-pay | Admitting: *Deleted

## 2021-04-04 DIAGNOSIS — I714 Abdominal aortic aneurysm, without rupture, unspecified: Secondary | ICD-10-CM

## 2021-04-04 DIAGNOSIS — I6523 Occlusion and stenosis of bilateral carotid arteries: Secondary | ICD-10-CM

## 2021-04-15 ENCOUNTER — Other Ambulatory Visit: Payer: Self-pay

## 2021-04-16 ENCOUNTER — Ambulatory Visit (INDEPENDENT_AMBULATORY_CARE_PROVIDER_SITE_OTHER): Payer: Medicare Other | Admitting: Internal Medicine

## 2021-04-16 ENCOUNTER — Encounter: Payer: Self-pay | Admitting: Internal Medicine

## 2021-04-16 VITALS — BP 140/82 | HR 71 | Temp 98.1°F | Ht 74.0 in | Wt 216.0 lb

## 2021-04-16 DIAGNOSIS — N1832 Chronic kidney disease, stage 3b: Secondary | ICD-10-CM | POA: Diagnosis not present

## 2021-04-16 DIAGNOSIS — E1122 Type 2 diabetes mellitus with diabetic chronic kidney disease: Secondary | ICD-10-CM

## 2021-04-16 DIAGNOSIS — I10 Essential (primary) hypertension: Secondary | ICD-10-CM

## 2021-04-16 DIAGNOSIS — I700 Atherosclerosis of aorta: Secondary | ICD-10-CM | POA: Diagnosis not present

## 2021-04-16 DIAGNOSIS — I6523 Occlusion and stenosis of bilateral carotid arteries: Secondary | ICD-10-CM

## 2021-04-16 DIAGNOSIS — E7800 Pure hypercholesterolemia, unspecified: Secondary | ICD-10-CM | POA: Diagnosis not present

## 2021-04-16 LAB — POCT GLYCOSYLATED HEMOGLOBIN (HGB A1C): Hemoglobin A1C: 6.8 % — AB (ref 4.0–5.6)

## 2021-04-16 NOTE — Patient Instructions (Signed)
Your A1c was OK today  Please continue all other medications as before, and refills have been done if requested.  Please have the pharmacy call with any other refills you may need.  Please continue your efforts at being more active, low cholesterol diet, and weight control.  You are otherwise up to date with prevention measures today.  Please keep your appointments with your specialists as you may have planned  Please make an Appointment to return in 6 months, or sooner if needed  

## 2021-04-16 NOTE — Progress Notes (Signed)
Patient ID: Thomas Marshall, male   DOB: September 23, 1941, 80 y.o.   MRN: [id number]        Chief Complaint:dm, htn, hld, chronic persistent afib       HPI:  Thomas Marshall is a 80 y.o. male here overall doing well.  Had recent labs per renal and will forward.  Taking Vit d,c,zinc and b12.  Pt denies chest pain, increased sob or doe, wheezing, orthopnea, PND, increased LE swelling, palpitations, dizziness or syncope.   Pt denies polydipsia, polyuria, or new focal neuro s/s.   Pt denies fever, wt loss, night sweats, loss of appetite, or other constitutional symptoms  No other new complaints.          Wt Readings from Last 3 Encounters:  04/16/21 216 lb (98 kg)  01/01/21 218 lb 12.8 oz (99.2 kg)  10/23/20 220 lb (99.8 kg)   BP Readings from Last 3 Encounters:  04/16/21 140/82  01/01/21 128/70  10/23/20 128/71         Past Medical History:  Diagnosis Date  . AAA (abdominal aortic aneurysm) ([place])   . Arthritis   . Cancer (HCC)    skin & squamous cell  . Carotid artery disease ([place])    a. s/p R CEA 12/2015.  [name] CKD (chronic kidney disease), stage III ([place])    a. per historical labs.  . Coronary artery disease    a. s/p CABG 2004.  . Diabetes mellitus without complication ([place] )   . GERD (gastroesophageal reflux disease)   . Hyperlipidemia   . Hypertension   . Medication intolerance Multiple   . PAF (paroxysmal atrial fibrillation) ([place])   . Pneumonia   . Sinus bradycardia    a. baseline HR 50s-60s, also h/o bradycardia on metoprolol and carvedilol   Past Surgical History:  Procedure Laterality Date  . ANKLE SURGERY     right fused  . BACK SURGERY     low back   . CARDIOVERSION N/A 02/14/2020   Procedure: CARDIOVERSION;  Surgeon: Geralynn Rile, MD;  Location: [place];  Service: Cardiovascular;  Laterality: N/A;  . CARDIOVERSION N/A 02/26/2020   Procedure: CARDIOVERSION;  Surgeon: [name], MD;  Location: [facility] ENDOSCOPY;  Service: Cardiovascular;  Laterality: N/A;  .  CARDIOVERSION N/A 04/12/2020   Procedure: CARDIOVERSION;  Surgeon: Skeet Latch, MD;  Location: [facility] ENDOSCOPY;  Service: Cardiovascular;  Laterality: N/A;  . CHOLECYSTECTOMY N/A 12/07/2013   Procedure: LAPAROSCOPIC CHOLECYSTECTOMY ;  Surgeon: [name], MD;  Location: [place];  Service: General;  Laterality: N/A;  . COLONOSCOPY    . CORONARY ARTERY BYPASS GRAFT  2004   [place]  . CORONARY STENT INTERVENTION N/A 05/21/2017   Procedure: Coronary Stent Intervention;  Surgeon: [name], MD;  Location: [place] CV LAB;  Service: Cardiovascular;  Laterality: N/A;  . ENDARTERECTOMY Right 12/10/2015   Procedure: Right Carotid ENDARTERECTOMY;  Surgeon: Conrad W[place]boro, MD;  Location: [place];  Service: Vascular;  Laterality: Right;  . EXPLORATION POST OPERATIVE OPEN HEART    . FINGER SURGERY     skin graft on rt index  . FINGER SURGERY Right    right index finger.  [name] HERNIA REPAIR Right    [facility]  . JOINT REPLACEMENT    . KNEE SURGERY     replacement on both knees  . LEFT HEART CATH AND CORS/GRAFTS ANGIOGRAPHY N/A 05/21/2017   Procedure: Left Heart Cath and Cors/Grafts Angiography;  Surgeon: [name], MD;  Location: [place] CV  LAB;  Service: Cardiovascular;  Laterality: N/A;  . LEFT HEART CATH AND CORS/GRAFTS ANGIOGRAPHY N/A 05/20/2020   Procedure: LEFT HEART CATH AND CORS/GRAFTS ANGIOGRAPHY;  Surgeon: [name], MD;  Location: [place] CV LAB;  Service: Cardiovascular;  Laterality: N/A;  . PACEMAKER IMPLANT N/A 03/14/2020   Procedure: PACEMAKER IMPLANT;  Surgeon: Evans Lance, MD;  Location: [place] CV LAB;  Service: Cardiovascular;  Laterality: N/A;  . PROSTATE SURGERY    . PVC ABLATION N/A 10/27/2017   Procedure: PVC ABLATION;  Surgeon: Evans Lance, MD;  Location: [place] CV LAB;  Service: Cardiovascular;  Laterality: N/A;  . TONSILLECTOMY    . URINARY SURGERY     scar tissue    reports that he quit smoking about 50 years ago. He has a 13.00  pack-year smoking history. He has never used smokeless tobacco. He reports that he does not drink alcohol and does not use drugs. family history includes Cancer in his sister; Diabetes in his brother and sister; Heart disease in his brother, father, and mother; Hyperlipidemia in his brother and father; Hypertension in his brother, father, and sister; Stroke in his mother. Allergies  Allergen Reactions  . Lisinopril Swelling and Other (See Comments)    Angioedema.   . Losartan Swelling and Other (See Comments)    Lips swell Angioedema   . Nifedipine Swelling and Other (See Comments)    Sugar increase  . Triamterene Swelling and Other (See Comments)    Face swells, no breathing impairment  . Hydralazine Hcl Other (See Comments)    Fatigue; poor appetite  . Losartan Potassium-Hctz Swelling and Other (See Comments)  . Amoxicillin Rash and Other (See Comments)    Did it involve swelling of the face/tongue/throat, SOB, or low BP? No Did it involve sudden or severe rash/hives, skin peeling, or any reaction on the inside of your mouth or nose? No Did you need to seek medical attention at a hospital or doctor's office? No When did it last happen?10 + years If all above answers are "NO", may proceed with cephalosporin use.    . Ampicillin Rash and Other (See Comments)    Did it involve swelling of the face/tongue/throat, SOB, or low BP? No Did it involve sudden or severe rash/hives, skin peeling, or any reaction on the inside of your mouth or nose? No Did you need to seek medical attention at a hospital or doctor's office? No When did it last happen?10 + years If all above answers are "NO", may proceed with cephalosporin use.   . Carvedilol Other (See Comments)    Low heart rate    Current Outpatient Medications on File Prior to Visit  Medication Sig Dispense Refill  . Accu-Chek Softclix Lancets lancets USE 1 TWICE DAILY USE AS DIRECTED E11.9 100 each 3  . apixaban (ELIQUIS)  2.5 MG TABS tablet Take 1 tablet (2.5 mg total) by mouth 2 (two) times daily. (0800 & 2000) 180 tablet 3  . aspirin EC 81 MG tablet Take 1 tablet (81 mg total) by mouth daily. 90 tablet 3  . atorvastatin (LIPITOR) 40 MG tablet Take 40 mg by mouth at bedtime.     . carvedilol (COREG) 3.125 MG tablet Take 1 tablet (3.125 mg total) by mouth 2 (two) times daily. 180 tablet 3  . cholecalciferol (VITAMIN D3) 25 MCG (1000 UNIT) tablet Take 1,000 Units by mouth daily.    [name] glucose blood test strip Accu-Check Guide Strips. Dx E11.9 Use to test blood  glucose level 1 time a day. 100 each 11  . hydrALAZINE (APRESOLINE) 10 MG tablet Take 10 mg by mouth 3 (three) times daily as needed (elevated bp greater than 160).    . magnesium oxide (MAG-OX) 400 MG tablet Take 800 mg by mouth 2 (two) times daily.     . meclizine (ANTIVERT) 25 MG tablet Take 1 tablet (25 mg total) by mouth 3 (three) times daily as needed for dizziness. 30 tablet 0  . mupirocin ointment (BACTROBAN) 2 % Apply 1 application topically 2 (two) times daily. 30 g 2  . nitroGLYCERIN (NITROSTAT) 0.4 MG SL tablet Place 1 tablet (0.4 mg total) under the tongue every 5 (five) minutes as needed for chest pain (x 3 doses). 30 tablet 3  . terazosin (HYTRIN) 1 MG capsule Take 1 mg by mouth at bedtime.  30 capsule 11  . vitamin C (ASCORBIC ACID) 500 MG tablet Take 500 mg by mouth daily with breakfast.     . Zinc 22.5 MG TABS Take 22.5 mg by mouth every other day.    . pantoprazole (PROTONIX) 40 MG tablet Take 1 tablet (40 mg total) by mouth daily. 90 tablet 3   No current facility-administered medications on file prior to visit.        ROS:  All others reviewed and negative.  Objective        PE:  BP 140/82 (BP Location: Left Arm, Patient Position: Sitting, Cuff Size: Large)   Pulse 71   Temp 98.1 F (36.7 C) (Oral)   Ht 6\' 2"  (1.88 m)   Wt 216 lb (98 kg)   SpO2 97%   BMI 27.73 kg/m                 Constitutional: Pt appears in NAD                HENT: Head: NCAT.                Right Ear: External ear normal.                 Left Ear: External ear normal.                Eyes: . Pupils are equal, round, and reactive to light. Conjunctivae and EOM are normal               Nose: without d/c or deformity               Neck: Neck supple. Gross normal ROM               Cardiovascular: Normal rate and regular rhythm.                 Pulmonary/Chest: Effort normal and breath sounds without rales or wheezing.                Abd:  Soft, NT, ND, + BS, no organomegaly               Neurological: Pt is alert. At baseline orientation, motor grossly intact               Skin: Skin is warm. No rashes, no other new lesions, LE edema - none               Psychiatric: Pt behavior is normal without agitation   Micro: none  Cardiac tracings I have personally interpreted today:  none  Pertinent Radiological findings (summarize): none  Lab Results  Component Value Date   WBC 4.9 05/16/2020   HGB 10.0 (L) 05/16/2020   HCT 30.8 (L) 05/16/2020   PLT 139 (L) 05/16/2020   GLUCOSE 92 10/16/2020   CHOL 126 10/16/2020   TRIG 108.0 10/16/2020   HDL 44.00 10/16/2020   LDLDIRECT 67.0 09/16/2016   LDLCALC 60 10/16/2020   ALT 27 10/16/2020   AST 29 10/16/2020   NA 139 10/16/2020   K 5.5 No hemolysis seen (H) 10/16/2020   CL 103 10/16/2020   CREATININE 1.86 (H) 10/16/2020   BUN 29 (H) 10/16/2020   CO2 32 10/16/2020   TSH 3.885 01/25/2020   PSA 1.09 03/31/2018   INR 1.1 01/02/2020   HGBA1C 6.8 (A) 04/16/2021   MICROALBUR 5.1 (H) 03/31/2018    Contains abnormal dataPOCT HgB A1C Order: [id number]  Status: Final result   Visible to patient: No (scheduled for 04/16/2021 4:01 PM)   Dx: Type 2 diabetes mellitus with stage 3...   0 Result Notes  Component Ref Range & Units 15:00  (04/16/21) 6 mo ago  (10/16/20) 1 yr ago  (11/21/19) 1 yr ago  (10/16/19) 1 yr ago  (04/18/19) 2 yr ago  (10/12/18) 3 yr ago  (03/31/18)  Hemoglobin A1C 4.0 - 5.6  % 6.8Abnormal  7.0High R, CM  6.5High R, CM  6.6High R, CM  6.8High R, CM  6.5 R, CM  6.7High       Assessment/Plan:  Thomas Marshall is a 80 y.o. White or Caucasian [1] male with  has a past medical history of AAA (abdominal aortic aneurysm) ([place]), Arthritis, Cancer ([place]), Carotid artery disease ([place]), CKD (chronic kidney disease), stage III ([place]), Coronary artery disease, Diabetes mellitus without complication ([place]), GERD (gastroesophageal reflux disease), Hyperlipidemia, Hypertension, Medication intolerance ([place] ), PAF (paroxysmal atrial fibrillation) ([place]), Pneumonia, and Sinus bradycardia.  Aortic atherosclerosis ([place]) To continue low chol diet, exercise, and lipitor 40   Type 2 diabetes mellitus with renal manifestations ([place]) Lab Results  Component Value Date   HGBA1C 6.8 (A) 04/16/2021   Stable, pt to continue current medical treatment  -diet   Hyperlipidemia . Lab Results  Component Value Date   LDLCALC 60 10/16/2020   Stable, pt to continue current statin - lipitor 40   Essential hypertension BP Readings from Last 3 Encounters:  04/16/21 140/82  01/01/21 128/70  10/23/20 128/71   Stable, pt to continue medical treatment coreg, hydralazine   Followup: Return in about 6 months (around 10/17/2021).  Cathlean Cower, MD 04/19/2021 9:04 PM [facility]

## 2021-04-19 ENCOUNTER — Encounter: Payer: Self-pay | Admitting: Internal Medicine

## 2021-04-19 NOTE — Assessment & Plan Note (Signed)
To continue low chol diet, exercise, and lipitor 40

## 2021-04-19 NOTE — Assessment & Plan Note (Signed)
Lab Results  Component Value Date   HGBA1C 6.8 (A) 04/16/2021   Stable, pt to continue current medical treatment  -diet

## 2021-04-19 NOTE — Assessment & Plan Note (Signed)
.   Lab Results  Component Value Date   LDLCALC 60 10/16/2020   Stable, pt to continue current statin - lipitor 40

## 2021-04-19 NOTE — Assessment & Plan Note (Signed)
BP Readings from Last 3 Encounters:  04/16/21 140/82  01/01/21 128/70  10/23/20 128/71   Stable, pt to continue medical treatment coreg, hydralazine

## 2021-04-23 ENCOUNTER — Ambulatory Visit (INDEPENDENT_AMBULATORY_CARE_PROVIDER_SITE_OTHER)
Admission: RE | Admit: 2021-04-23 | Discharge: 2021-04-23 | Disposition: A | Discharge status: Home / Self Care | Payer: Medicare Other | Source: Ambulatory Visit | Attending: Vascular Surgery | Admitting: Vascular Surgery

## 2021-04-23 ENCOUNTER — Encounter: Payer: Self-pay | Admitting: Vascular Surgery

## 2021-04-23 ENCOUNTER — Other Ambulatory Visit: Payer: Self-pay

## 2021-04-23 ENCOUNTER — Ambulatory Visit (INDEPENDENT_AMBULATORY_CARE_PROVIDER_SITE_OTHER): Payer: Medicare Other | Admitting: Vascular Surgery

## 2021-04-23 ENCOUNTER — Ambulatory Visit (HOSPITAL_COMMUNITY)
Admission: RE | Admit: 2021-04-23 | Discharge: 2021-04-23 | Disposition: A | Discharge status: Home / Self Care | Payer: Medicare Other | Source: Ambulatory Visit | Attending: Vascular Surgery | Admitting: Vascular Surgery

## 2021-04-23 VITALS — BP 146/87 | HR 61 | Temp 97.5°F | Resp 20 | Ht 74.0 in | Wt 217.0 lb

## 2021-04-23 DIAGNOSIS — I714 Abdominal aortic aneurysm, without rupture, unspecified: Secondary | ICD-10-CM

## 2021-04-23 DIAGNOSIS — I6523 Occlusion and stenosis of bilateral carotid arteries: Secondary | ICD-10-CM | POA: Diagnosis not present

## 2021-04-23 DIAGNOSIS — Z9889 Other specified postprocedural states: Secondary | ICD-10-CM | POA: Diagnosis not present

## 2021-04-23 DIAGNOSIS — I6522 Occlusion and stenosis of left carotid artery: Secondary | ICD-10-CM

## 2021-04-23 NOTE — Progress Notes (Signed)
REASON FOR VISIT:   Follow-up of abdominal aortic aneurysm and bilateral carotid disease  MEDICAL ISSUES:   60 TO 79% ASYMPTOMATIC LEFT CAROTID STENOSIS: This patient is undergone a previous right carotid endarterectomy by Dr. Adele Barthel.  He has no evidence of recurrent stenosis on the right.  We have been following a moderate 60 to 79% left carotid stenosis.  This has not changed significantly over the last 6 months.  The velocities are essentially the same.  He is asymptomatic.  He is on aspirin and is on a statin.  I have ordered a follow-up carotid duplex scan in 6 months and will have him seen on the PA schedule at that time.  He knows to call sooner if he has problems.  ABDOMINAL AORTIC ANEURYSM: This patient has a small 3.5 cm infrarenal abdominal aortic aneurysm.  This is increased only slightly in size over the last 3 years.  I recommended a follow-up ultrasound in 2 years and I have ordered that test.  He understands we would not consider elective repair unless the aneurysm reaches 5.5 cm in maximum diameter and a normal risk patient.  He does have a family member who died from a ruptured aneurysm so I reassured him that we certainly would continue to follow this indefinitely.  He is not a smoker.  His blood pressures under good control.  HPI:   Thomas Marshall is a pleasant 80 y.o. male who underwent a right carotid endarterectomy by Dr. Adele Barthel in 2017.  We have been following a moderate 60 to 79% left carotid stenosis.  In addition the patient has a known small abdominal aortic aneurysm.  I last saw him on 10/23/2020.  Since his last visit, he denies any history of abdominal pain or back pain.  He denies any history of stroke, TIAs, expressive or receptive aphasia, or amaurosis fugax.  He is on aspirin and is on a statin.  He is on Eliquis for atrial fibrillation.  He is not a smoker.  His blood pressures been under good control.  Past Medical History:  Diagnosis Date  . AAA  (abdominal aortic aneurysm) ([place])   . Arthritis   . Cancer (HCC)    skin & squamous cell  . Carotid artery disease ([place])    a. s/p R CEA 12/2015.  [name] CKD (chronic kidney disease), stage III ([place])    a. per historical labs.  . Coronary artery disease    a. s/p CABG 2004.  . Diabetes mellitus without complication ([place])   . GERD (gastroesophageal reflux disease)   . Hyperlipidemia   . Hypertension   . Medication intolerance Multiple   . PAF (paroxysmal atrial fibrillation) ([place])   . Pneumonia   . Sinus bradycardia    a. baseline HR 50s-60s, also h/o bradycardia on metoprolol and carvedilol    Family History  Problem Relation Age of Onset  . Stroke Mother   . Heart disease Mother   . Heart disease Father   . Hypertension Father   . Hyperlipidemia Father   . Cancer Sister        breast  and skin  . Diabetes Sister   . Hypertension Sister   . Diabetes Brother   . Heart disease Brother   . Hyperlipidemia Brother   . Hypertension Brother   . Early death Neg Hx     SOCIAL HISTORY: Social History   Tobacco Use  . Smoking status: Former Smoker    Packs/day: 1.00  Years: 13.00    Pack years: 13.00    Quit date: 11/30/1970    Years since quitting: 50.4  . Smokeless tobacco: Never Used  Substance Use Topics  . Alcohol use: No    Alcohol/week: 0.0 standard drinks    Allergies  Allergen Reactions  . Lisinopril Swelling and Other (See Comments)    Angioedema.   . Losartan Swelling and Other (See Comments)    Lips swell Angioedema   . Nifedipine Swelling and Other (See Comments)    Sugar increase  . Triamterene Swelling and Other (See Comments)    Face swells, no breathing impairment  . Hydralazine Hcl Other (See Comments)    Fatigue; poor appetite  . Losartan Potassium-Hctz Swelling and Other (See Comments)  . Amoxicillin Rash and Other (See Comments)    Did it involve swelling of the face/tongue/throat, SOB, or low BP? No Did it involve sudden or severe  rash/hives, skin peeling, or any reaction on the inside of your mouth or nose? No Did you need to seek medical attention at a hospital or doctor's office? No When did it last happen?10 + years If all above answers are "NO", may proceed with cephalosporin use.    . Ampicillin Rash and Other (See Comments)    Did it involve swelling of the face/tongue/throat, SOB, or low BP? No Did it involve sudden or severe rash/hives, skin peeling, or any reaction on the inside of your mouth or nose? No Did you need to seek medical attention at a hospital or doctor's office? No When did it last happen?10 + years If all above answers are "NO", may proceed with cephalosporin use.   . Carvedilol Other (See Comments)    Low heart rate     Current Outpatient Medications  Medication Sig Dispense Refill  . Accu-Chek Softclix Lancets lancets USE 1 TWICE DAILY USE AS DIRECTED E11.9 100 each 3  . apixaban (ELIQUIS) 2.5 MG TABS tablet Take 1 tablet (2.5 mg total) by mouth 2 (two) times daily. (0800 & 2000) 180 tablet 3  . aspirin EC 81 MG tablet Take 1 tablet (81 mg total) by mouth daily. 90 tablet 3  . atorvastatin (LIPITOR) 40 MG tablet Take 40 mg by mouth at bedtime.     . carvedilol (COREG) 3.125 MG tablet Take 1 tablet (3.125 mg total) by mouth 2 (two) times daily. 180 tablet 3  . cholecalciferol (VITAMIN D3) 25 MCG (1000 UNIT) tablet Take 1,000 Units by mouth daily.    [name] glucose blood test strip Accu-Check Guide Strips. Dx E11.9 Use to test blood glucose level 1 time a day. 100 each 11  . hydrALAZINE (APRESOLINE) 10 MG tablet Take 10 mg by mouth 3 (three) times daily as needed (elevated bp greater than 160).    . magnesium oxide (MAG-OX) 400 MG tablet Take 800 mg by mouth 2 (two) times daily.     . meclizine (ANTIVERT) 25 MG tablet Take 1 tablet (25 mg total) by mouth 3 (three) times daily as needed for dizziness. 30 tablet 0  . mupirocin ointment (BACTROBAN) 2 % Apply 1 application topically 2  (two) times daily. 30 g 2  . nitroGLYCERIN (NITROSTAT) 0.4 MG SL tablet Place 1 tablet (0.4 mg total) under the tongue every 5 (five) minutes as needed for chest pain (x 3 doses). 30 tablet 3  . terazosin (HYTRIN) 1 MG capsule Take 1 mg by mouth at bedtime.  30 capsule 11  . vitamin C (ASCORBIC ACID) 500 MG  tablet Take 500 mg by mouth daily with breakfast.     . Zinc 22.5 MG TABS Take 22.5 mg by mouth every other day.    . pantoprazole (PROTONIX) 40 MG tablet Take 1 tablet (40 mg total) by mouth daily. 90 tablet 3   No current facility-administered medications for this visit.    REVIEW OF SYSTEMS:  [X]  denotes positive finding, [ ]  denotes negative finding Cardiac  Comments:  Chest pain or chest pressure:    Shortness of breath upon exertion:    Short of breath when lying flat:    Irregular heart rhythm:        Vascular    Pain in calf, thigh, or hip brought on by ambulation:    Pain in feet at night that wakes you up from your sleep:     Blood clot in your veins:    Leg swelling:         Pulmonary    Oxygen at home:    Productive cough:     Wheezing:         Neurologic    Sudden weakness in arms or legs:     Sudden numbness in arms or legs:     Sudden onset of difficulty speaking or slurred speech:    Temporary loss of vision in one eye:     Problems with dizziness:         Gastrointestinal    Blood in stool:     Vomited blood:         Genitourinary    Burning when urinating:     Blood in urine:        Psychiatric    Major depression:         Hematologic    Bleeding problems:    Problems with blood clotting too easily:        Skin    Rashes or ulcers:        Constitutional    Fever or chills:     PHYSICAL EXAM:   Vitals:   04/23/21 0939 04/23/21 0941  BP: (!) 142/83 (!) 146/87  Pulse: 61   Resp: 20   Temp: (!) 97.5 F (36.4 C)   SpO2: 97%   Weight: 217 lb (98.4 kg)   Height: 6\' 2"  (1.88 m)     GENERAL: The patient is a well-nourished male, in no  acute distress. The vital signs are documented above. CARDIAC: There is a regular rate and rhythm.  VASCULAR: I do not detect carotid bruits. He has palpable femoral pulses. He has a palpable right dorsalis pedis pulse and left posterior tibial pulse. He has no significant lower extremity swelling. PULMONARY: There is good air exchange bilaterally without wheezing or rales. ABDOMEN: Soft and non-tender with normal pitched bowel sounds.  Because of the size I am unable to palpate his aneurysm. MUSCULOSKELETAL: There are no major deformities or cyanosis. NEUROLOGIC: No focal weakness or paresthesias are detected. SKIN: There are no ulcers or rashes noted. PSYCHIATRIC: The patient has a normal affect.  DATA:    DUPLEX ABDOMINAL AORTA: I have independently interpreted his duplex of the abdominal aorta.  The maximum diameter of his infrarenal aorta is 3.5 cm.  This has increased slightly from 3.2 cm in March 2019.  Thus there is been minimal enlargement over 3 years.  The right common iliac artery measures 1.5 cm in maximum diameter.  The left common iliac artery measures 1.4 cm in maximum diameter.  CAROTID DUPLEX:  I have independently interpreted his carotid duplex scan today.  On the right side there is a less than 39% stenosis.  The right vertebral artery is patent with antegrade flow.  On the left side there is a 60 to 79% stenosis.  The left vertebral artery is patent with antegrade flow.  Deitra Mayo [facility] [phone]

## 2021-04-25 ENCOUNTER — Other Ambulatory Visit: Payer: Self-pay

## 2021-04-25 DIAGNOSIS — I6523 Occlusion and stenosis of bilateral carotid arteries: Secondary | ICD-10-CM

## 2021-05-05 ENCOUNTER — Other Ambulatory Visit: Payer: Self-pay

## 2021-05-05 ENCOUNTER — Ambulatory Visit (INDEPENDENT_AMBULATORY_CARE_PROVIDER_SITE_OTHER): Payer: Medicare Other | Admitting: Podiatry

## 2021-05-05 DIAGNOSIS — M79675 Pain in left toe(s): Secondary | ICD-10-CM

## 2021-05-05 DIAGNOSIS — M79674 Pain in right toe(s): Secondary | ICD-10-CM | POA: Diagnosis not present

## 2021-05-05 DIAGNOSIS — E1140 Type 2 diabetes mellitus with diabetic neuropathy, unspecified: Secondary | ICD-10-CM

## 2021-05-05 DIAGNOSIS — Z7901 Long term (current) use of anticoagulants: Secondary | ICD-10-CM | POA: Diagnosis not present

## 2021-05-05 DIAGNOSIS — B351 Tinea unguium: Secondary | ICD-10-CM | POA: Diagnosis not present

## 2021-05-05 DIAGNOSIS — L84 Corns and callosities: Secondary | ICD-10-CM | POA: Diagnosis not present

## 2021-05-05 NOTE — Progress Notes (Signed)
Patient ID: Thomas Marshall, male   DOB: 11-22-1941, 80 y.o.   MRN: [id number]  Subjective: 80 y.o. returns the office today for painful, elongated, thickened toenails which he cannot trim himself and for calluses on the right foot.  No open lesions he reports. He apply band-aids on the callus area to help prevent them from getting worse. Denies any acute changes since last appointment and no new complaints today. Denies any systemic complaints such as fevers, chills, nausea, vomiting.   He is on Eliquis   Objective: AAO 3, NAD DP/PT pulses palpable, CRT less than 3 seconds Protective sensation decreased with Simms Weinstein monofilament Nails hypertrophic, dystrophic, elongated, brittle, discolored 10. There is tenderness overlying the nails 1-5 bilaterally. There is no surrounding erythema or drainage along the nail sites. There is minimal hyperkeratotic lesion right fifth metatarsal base and fifth metatarsal head. No ulceration, drainage or any signs of infection.  No pain with calf compression, swelling, warmth, erythema.  Assessment: Patient presents with symptomatic onychomycosis; hyperkeratotic tissue  Plan: -Treatment options including alternatives, risks, complications were discussed -Nails sharply debrided 10 without complication/bleeding. -Hyperkeratotic tissue sharply debrided x 2 without any complications or bleeding. Continue moisturizer and offloading.  -Discussed daily foot inspection. If there are any changes, to call the office immediately.  -Follow-up in 3 months or sooner if any problems are to arise. In the meantime, encouraged to call the office with any questions, concerns, changes symptoms.  Celesta Gentile, DPM

## 2021-06-11 ENCOUNTER — Other Ambulatory Visit: Payer: Self-pay | Admitting: Cardiovascular Disease

## 2021-06-12 ENCOUNTER — Telehealth: Payer: Self-pay | Admitting: Cardiovascular Disease

## 2021-06-12 MED ORDER — CARVEDILOL 3.125 MG PO TABS
3.1250 mg | ORAL_TABLET | Freq: Two times a day (BID) | ORAL | 1 refills | Status: DC
Start: 2021-06-12 — End: 2021-10-15

## 2021-06-12 NOTE — Telephone Encounter (Signed)
Pt's medication was sent to pt's pharmacy as requested. Confirmation received.  °

## 2021-06-12 NOTE — Telephone Encounter (Signed)
*  STAT* If patient is at the pharmacy, call can be transferred to refill team.   1. Which medications need to be refilled? (please list name of each medication and dose if known)  carvedilol (COREG) 3.125 MG tablet  2. Which pharmacy/location (including street and city if local pharmacy) is medication to be sent to? [facility] [address]  3. Do they need a 30 day or 90 day supply?   Patient has an appointment scheduled for 10/15/21 with [name]. He is hoping for enough medication to last him until this appointment.

## 2021-06-16 ENCOUNTER — Ambulatory Visit (INDEPENDENT_AMBULATORY_CARE_PROVIDER_SITE_OTHER): Payer: Medicare Other

## 2021-06-16 DIAGNOSIS — I442 Atrioventricular block, complete: Secondary | ICD-10-CM | POA: Diagnosis not present

## 2021-06-17 LAB — CUP PACEART REMOTE DEVICE CHECK
Battery Remaining Longevity: 131 mo
Battery Remaining Percentage: 92 %
Battery Voltage: 3.02 V
Brady Statistic RV Percent Paced: 1 %
Date Time Interrogation Session: 20220718020013
Implantable Lead Implant Date: 20210415
Implantable Lead Implant Date: 20210415
Implantable Lead Location: 753859
Implantable Lead Location: 753860
Implantable Pulse Generator Implant Date: 20210415
Lead Channel Impedance Value: 590 Ohm
Lead Channel Pacing Threshold Amplitude: 0.5 V
Lead Channel Pacing Threshold Pulse Width: 0.5 ms
Lead Channel Sensing Intrinsic Amplitude: 12 mV
Lead Channel Setting Pacing Amplitude: 2.5 V
Lead Channel Setting Pacing Pulse Width: 0.5 ms
Lead Channel Setting Sensing Sensitivity: 2 mV
Pulse Gen Model: 2272
Pulse Gen Serial Number: 3813712

## 2021-06-27 ENCOUNTER — Other Ambulatory Visit: Payer: Self-pay | Admitting: Internal Medicine

## 2021-06-27 ENCOUNTER — Other Ambulatory Visit: Payer: Self-pay

## 2021-06-27 DIAGNOSIS — E1122 Type 2 diabetes mellitus with diabetic chronic kidney disease: Secondary | ICD-10-CM

## 2021-06-27 DIAGNOSIS — N182 Chronic kidney disease, stage 2 (mild): Secondary | ICD-10-CM

## 2021-06-27 MED ORDER — ACCU-CHEK SOFTCLIX LANCETS MISC: 3 refills | Status: DC

## 2021-06-27 MED ORDER — GLUCOSE BLOOD VI STRP: ORAL_STRIP | 11 refills | Status: DC

## 2021-07-03 DIAGNOSIS — C44329 Squamous cell carcinoma of skin of other parts of face: Secondary | ICD-10-CM | POA: Diagnosis not present

## 2021-07-03 DIAGNOSIS — L570 Actinic keratosis: Secondary | ICD-10-CM | POA: Diagnosis not present

## 2021-07-03 DIAGNOSIS — D1801 Hemangioma of skin and subcutaneous tissue: Secondary | ICD-10-CM | POA: Diagnosis not present

## 2021-07-08 NOTE — Progress Notes (Signed)
Remote pacemaker transmission.   

## 2021-07-17 DIAGNOSIS — I4811 Longstanding persistent atrial fibrillation: Secondary | ICD-10-CM | POA: Diagnosis not present

## 2021-07-17 DIAGNOSIS — N1832 Chronic kidney disease, stage 3b: Secondary | ICD-10-CM | POA: Diagnosis not present

## 2021-07-17 DIAGNOSIS — E1122 Type 2 diabetes mellitus with diabetic chronic kidney disease: Secondary | ICD-10-CM | POA: Diagnosis not present

## 2021-07-17 DIAGNOSIS — I129 Hypertensive chronic kidney disease with stage 1 through stage 4 chronic kidney disease, or unspecified chronic kidney disease: Secondary | ICD-10-CM | POA: Diagnosis not present

## 2021-07-17 DIAGNOSIS — D631 Anemia in chronic kidney disease: Secondary | ICD-10-CM | POA: Diagnosis not present

## 2021-07-17 DIAGNOSIS — N189 Chronic kidney disease, unspecified: Secondary | ICD-10-CM | POA: Diagnosis not present

## 2021-07-17 DIAGNOSIS — R809 Proteinuria, unspecified: Secondary | ICD-10-CM | POA: Diagnosis not present

## 2021-07-17 DIAGNOSIS — N2581 Secondary hyperparathyroidism of renal origin: Secondary | ICD-10-CM | POA: Diagnosis not present

## 2021-08-05 ENCOUNTER — Ambulatory Visit: Payer: Medicare Other | Admitting: Podiatry

## 2021-08-07 ENCOUNTER — Other Ambulatory Visit: Payer: Self-pay

## 2021-08-07 ENCOUNTER — Ambulatory Visit (INDEPENDENT_AMBULATORY_CARE_PROVIDER_SITE_OTHER): Payer: Medicare Other | Admitting: Podiatry

## 2021-08-07 DIAGNOSIS — M79675 Pain in left toe(s): Secondary | ICD-10-CM

## 2021-08-07 DIAGNOSIS — E1140 Type 2 diabetes mellitus with diabetic neuropathy, unspecified: Secondary | ICD-10-CM | POA: Diagnosis not present

## 2021-08-07 DIAGNOSIS — Z7901 Long term (current) use of anticoagulants: Secondary | ICD-10-CM | POA: Diagnosis not present

## 2021-08-07 DIAGNOSIS — M79674 Pain in right toe(s): Secondary | ICD-10-CM

## 2021-08-07 DIAGNOSIS — B351 Tinea unguium: Secondary | ICD-10-CM | POA: Diagnosis not present

## 2021-08-09 ENCOUNTER — Ambulatory Visit (INDEPENDENT_AMBULATORY_CARE_PROVIDER_SITE_OTHER): Payer: Medicare Other

## 2021-08-09 DIAGNOSIS — Z23 Encounter for immunization: Secondary | ICD-10-CM | POA: Diagnosis not present

## 2021-08-12 NOTE — Progress Notes (Signed)
Patient ID: Thomas Marshall, male   DOB: 1941-01-30, 80 y.o.   MRN: [id number]  Subjective: 81 y.o. returns the office today for painful, elongated, thickened toenails which he cannot trim himself and for calluses on the right foot.  No open lesions he reports.  He tries to be moisturizing the calluses and they have not been causing significant discomfort.  No other concerns today.    He is on Eliquis  Last A1c was 6.8 on Apr 16, 2021  Objective: AAO 3, NAD DP/PT pulses palpable, CRT less than 3 seconds Protective sensation decreased with Simms Weinstein monofilament Nails hypertrophic, dystrophic, elongated, brittle, discolored 10. There is tenderness overlying the nails 1-5 bilaterally. There is no surrounding erythema or drainage along the nail sites. There is minimal hyperkeratotic lesion right fifth metatarsal base and fifth metatarsal head. No ulceration, drainage or any signs of infection.  No pain with calf compression, swelling, warmth, erythema.  Assessment: Patient presents with symptomatic onychomycosis; hyperkeratotic tissue  Plan: -Treatment options including alternatives, risks, complications were discussed -Nails sharply debrided 10 without complication/bleeding. -There is no significant hyperkeratotic tissue to debride today.  Continue moisturizer and offloading. -Discussed daily foot inspection. If there are any changes, to call the office immediately.  -Follow-up in 3 months or sooner if any problems are to arise. In the meantime, encouraged to call the office with any questions, concerns, changes symptoms.  Celesta Gentile, DPM

## 2021-08-13 ENCOUNTER — Telehealth: Payer: Self-pay | Admitting: Internal Medicine

## 2021-08-13 NOTE — Telephone Encounter (Signed)
   Patient calling to report he got a no show letter for 9/10 flu shot, howver he was NOT  a no show he did receive injection

## 2021-09-15 ENCOUNTER — Ambulatory Visit (INDEPENDENT_AMBULATORY_CARE_PROVIDER_SITE_OTHER): Payer: Medicare Other

## 2021-09-15 DIAGNOSIS — I442 Atrioventricular block, complete: Secondary | ICD-10-CM | POA: Diagnosis not present

## 2021-09-16 LAB — CUP PACEART REMOTE DEVICE CHECK
Battery Remaining Longevity: 128 mo
Battery Remaining Percentage: 91 %
Battery Voltage: 3.02 V
Brady Statistic RV Percent Paced: 1 %
Date Time Interrogation Session: 20221017020012
Implantable Lead Implant Date: 20210415
Implantable Lead Implant Date: 20210415
Implantable Lead Location: 753859
Implantable Lead Location: 753860
Implantable Pulse Generator Implant Date: 20210415
Lead Channel Impedance Value: 560 Ohm
Lead Channel Pacing Threshold Amplitude: 0.5 V
Lead Channel Pacing Threshold Pulse Width: 0.5 ms
Lead Channel Sensing Intrinsic Amplitude: 12 mV
Lead Channel Setting Pacing Amplitude: 2.5 V
Lead Channel Setting Pacing Pulse Width: 0.5 ms
Lead Channel Setting Sensing Sensitivity: 2 mV
Pulse Gen Model: 2272
Pulse Gen Serial Number: 3813712

## 2021-09-23 NOTE — Progress Notes (Signed)
Remote pacemaker transmission.   

## 2021-10-08 NOTE — Progress Notes (Signed)
Cardiology Office Note    Date:  10/15/2021   ID:  Thomas, Marshall [birth year], MRN [id number]   PCP:  Thomas Borg, MD   [place]  Cardiologist:  Thomas Mocha, MD   Advanced Practice Provider:  No care team member to display Electrophysiologist:  Thomas Peru, MD   [phone]   No chief complaint on file.   History of Present Illness:  Thomas Marshall is a 80 y.o. male with history of CAD status post CABG, symptomatic PVCs status post ablation 2018 with marked reduction in his PVCs from 25% down to 7%.  PAF on dofetilide, CAD because of weight loss Eliquis was reduced from 5 mg to 2.5 mg twice daily.  Also has carotid disease status post right carotid endarterectomy, DM, small AAA, hypertension, CKD stage III-IV and HLD.   Last cath 04/2017 showed patent LIMA to the LAD and RIMA to the RCA, patent SVG to OM1 with moderate eccentric lesion just distal to the graft insertion site, patent SVG to ramus intermediate with a critical lesion in the proximal body of the graft treated with successful PCI/DES using distal embolic protection   Hospitalized in 10/2019 with numbness and tingling of his left lip and arm.  CT of the brain revealed a new ganglia and external capsule region hypodensity thought to be likely chronic lacunar.  CTA of the head revealed a 80% stenosis of the left internal carotid artery, carotid Doppler showed less than 80% stenosis and is followed by vascular.  No new recommendations from neurology.    Repeat cath 04/2020 for chest pain total occlusion of the left main and RCA, patent RIMA to the PDA LIMA to the LAD SVG to the diagonal and SVG to the OM graft.  Moderate severe stenosis in the distal anastomosis of the SVG to the graft OM unchanged over time.  Brisk TIMI-3 flow most likely not causing patient's symptoms.  Patient saw [name] 01/01/2021 after he developed recurrent atrial fib despite dofetilide.  He was placed on amiodarone and  converted to normal sinus rhythm, it was held and he reverted back into A. fib with RVR.  Rate was controlled at that visit.  Patient comes in for f/u. Denies chest pain, dyspnea, dizziness or presyncope. Has no energy in Afib. Can't do anything. Can't rake leaves. HR usually stays in the 80-90's. No bleeding problems on eliquis.  Past Medical History:  Diagnosis Date   AAA (abdominal aortic aneurysm)    Arthritis    Cancer ([place])    skin & squamous cell   Carotid artery disease ([place])    a. s/p R CEA 12/2015.   CKD (chronic kidney disease), stage III ([place])    a. per historical labs.   Coronary artery disease    a. s/p CABG 2004.   Diabetes mellitus without complication (HCC)    GERD (gastroesophageal reflux disease)    Hyperlipidemia    Hypertension    Medication intolerance Multiple    PAF (paroxysmal atrial fibrillation) (HCC)    Pneumonia    Sinus bradycardia    a. baseline HR 50s-60s, also h/o bradycardia on metoprolol and carvedilol    Past Surgical History:  Procedure Laterality Date   ANKLE SURGERY     right fused   BACK SURGERY     low back    CARDIOVERSION N/A 02/14/2020   Procedure: CARDIOVERSION;  Surgeon: Thomas Rile, MD;  Location: [place];  Service: Cardiovascular;  Laterality:  N/A;   CARDIOVERSION N/A 02/26/2020   Procedure: CARDIOVERSION;  Surgeon: Thomas Casino, MD;  Location: [facility] ENDOSCOPY;  Service: Cardiovascular;  Laterality: N/A;   CARDIOVERSION N/A 04/12/2020   Procedure: CARDIOVERSION;  Surgeon: Thomas Latch, MD;  Location: [facility] ENDOSCOPY;  Service: Cardiovascular;  Laterality: N/A;   CHOLECYSTECTOMY N/A 12/07/2013   Procedure: LAPAROSCOPIC CHOLECYSTECTOMY ;  Surgeon: Thomas Bookbinder, MD;  Location: [facility] OR;  Service: General;  Laterality: N/A;   COLONOSCOPY     CORONARY ARTERY BYPASS GRAFT  2004   [place] N/A 05/21/2017   Procedure: Coronary Stent Intervention;  Surgeon: Thomas Mocha, MD;  Location:  [place] CV LAB;  Service: Cardiovascular;  Laterality: N/A;   ENDARTERECTOMY Right 12/10/2015   Procedure: Right Carotid ENDARTERECTOMY;  Surgeon: Thomas Hysham, MD;  Location: [place];  Service: Vascular;  Laterality: Right;   EXPLORATION POST OPERATIVE OPEN HEART     FINGER SURGERY     skin graft on rt index   FINGER SURGERY Right    right index finger.   HERNIA REPAIR Right    RIH   JOINT REPLACEMENT     KNEE SURGERY     replacement on both knees   LEFT HEART CATH AND CORS/GRAFTS ANGIOGRAPHY N/A 05/21/2017   Procedure: Left Heart Cath and Cors/Grafts Angiography;  Surgeon: Thomas Mocha, MD;  Location: [place] CV LAB;  Service: Cardiovascular;  Laterality: N/A;   LEFT HEART CATH AND CORS/GRAFTS ANGIOGRAPHY N/A 05/20/2020   Procedure: LEFT HEART CATH AND CORS/GRAFTS ANGIOGRAPHY;  Surgeon: Thomas Mocha, MD;  Location: [place] CV LAB;  Service: Cardiovascular;  Laterality: N/A;   PACEMAKER IMPLANT N/A 03/14/2020   Procedure: PACEMAKER IMPLANT;  Surgeon: Thomas Lance, MD;  Location: [place] CV LAB;  Service: Cardiovascular;  Laterality: N/A;   PROSTATE SURGERY     PVC ABLATION N/A 10/27/2017   Procedure: PVC ABLATION;  Surgeon: Thomas Lance, MD;  Location: [place] CV LAB;  Service: Cardiovascular;  Laterality: N/A;   TONSILLECTOMY     URINARY SURGERY     scar tissue    Current Medications: Current Meds  Medication Sig   ACCU-CHEK GUIDE test strip USE 1 NEW TEST STRIP ONCE DAILY TO TEST BLOOD SUGAR   Accu-Chek Softclix Lancets lancets USE 1 TWICE DAILY USE AS DIRECTED E11.9   apixaban (ELIQUIS) 2.5 MG TABS tablet Take 1 tablet (2.5 mg total) by mouth 2 (two) times daily. (0800 & 2000)   aspirin EC 81 MG tablet Take 1 tablet (81 mg total) by mouth daily.   atorvastatin (LIPITOR) 40 MG tablet Take 40 mg by mouth at bedtime.    carvedilol (COREG) 6.25 MG tablet Take 1 tablet (6.25 mg total) by mouth 2 (two) times daily.   cholecalciferol (VITAMIN D3) 25 MCG  (1000 UNIT) tablet Take 1,000 Units by mouth daily.   glucose blood test strip Accu-Check Guide Strips. Dx E11.9 Use to test blood glucose level 1 time a day.   hydrALAZINE (APRESOLINE) 10 MG tablet Take 10 mg by mouth 3 (three) times daily as needed (elevated bp greater than 160).   magnesium oxide (MAG-OX) 400 MG tablet Take 800 mg by mouth 2 (two) times daily.    meclizine (ANTIVERT) 25 MG tablet Take 1 tablet (25 mg total) by mouth 3 (three) times daily as needed for dizziness.   mupirocin ointment (BACTROBAN) 2 % Apply 1 application topically 2 (two) times daily.   nitroGLYCERIN (NITROSTAT) 0.4 MG SL  tablet Place 1 tablet (0.4 mg total) under the tongue every 5 (five) minutes as needed for chest pain (x 3 doses).   terazosin (HYTRIN) 1 MG capsule Take 1 mg by mouth at bedtime.    vitamin C (ASCORBIC ACID) 500 MG tablet Take 500 mg by mouth daily with breakfast.    Zinc 22.5 MG TABS Take 22.5 mg by mouth every other day.   [DISCONTINUED] carvedilol (COREG) 3.125 MG tablet Take 1 tablet (3.125 mg total) by mouth 2 (two) times daily. Please keep upcoming appt in November 2022 with Cardiologist before anymore refills. Thank you Final attempt     Allergies:   Lisinopril, Losartan, Nifedipine, Triamterene, Hydralazine hcl, Losartan potassium-hctz, Amoxicillin, Ampicillin, and Carvedilol   Social History   Socioeconomic History   Marital status: Married    Spouse name: Not on file   Number of children: 2   Years of education: Not on file   Highest education level: Not on file  Occupational History   Occupation: retired  Tobacco Use   Smoking status: Former    Packs/day: 1.00    Years: 13.00    Pack years: 13.00    Types: Cigarettes    Quit date: 11/30/1970    Years since quitting: 50.9   Smokeless tobacco: Never  Vaping Use   Vaping Use: Never used  Substance and Sexual Activity   Alcohol use: No    Alcohol/week: 0.0 standard drinks   Drug use: No   Sexual activity: Yes  Other  Topics Concern   Not on file  Social History Narrative   Not on file   Social Determinants of Health   Financial Resource Strain: Not on file  Food Insecurity: Not on file  Transportation Needs: Not on file  Physical Activity: Not on file  Stress: Not on file  Social Connections: Not on file     Family History:  The patient's  family history includes Cancer in his sister; Diabetes in his brother and sister; Heart disease in his brother, father, and mother; Hyperlipidemia in his brother and father; Hypertension in his brother, father, and sister; Stroke in his mother.   ROS:   Please see the history of present illness.    ROS All other systems reviewed and are negative.   PHYSICAL EXAM:   VS:  BP 122/82   Pulse 89   Ht 6' 2" (1.88 m)   Wt 214 lb (97.1 kg)   SpO2 99%   BMI 27.48 kg/m   Physical Exam  GEN: Well nourished, well developed, in no acute distress  Neck: bilateral carotid bruits, no JVD or masses Cardiac:RRR; no murmurs, rubs, or gallops  Respiratory:  clear to auscultation bilaterally, normal work of breathing GI: soft, nontender, nondistended, + BS Ext: without cyanosis, clubbing, or edema, Good distal pulses bilaterally Neuro:  Alert and Oriented x 3,  Psych: euthymic mood, full affect  Wt Readings from Last 3 Encounters:  10/15/21 214 lb (97.1 kg)  04/23/21 217 lb (98.4 kg)  04/16/21 216 lb (98 kg)      Studies/Labs Reviewed:   EKG:  EKG is not ordered today.    Recent Labs: 10/16/2020: ALT 27; BUN 29; Creatinine, Ser 1.86; Potassium 5.5 No hemolysis seen; Sodium 139   Lipid Panel    Component Value Date/Time   CHOL 126 10/16/2020 1420   TRIG 108.0 10/16/2020 1420   HDL 44.00 10/16/2020 1420   CHOLHDL 3 10/16/2020 1420   VLDL 21.6 10/16/2020 1420   LDLCALC  60 10/16/2020 1420   LDLDIRECT 67.0 09/16/2016 1455    Additional studies/ records that were reviewed today include:   Cardiac Cath 05/20/2020: Conclusion   1.  Severe native vessel  coronary artery disease with total occlusion of the left main and total occlusion of the RCA 2.  Status post aortocoronary bypass surgery with continued patency of the RIMA to PDA, LIMA to LAD, saphenous vein graft to diagonal, and saphenous vein graft OM   Recommendations: Ongoing medical therapy.  No targets for intervention.  Note there is a moderately severe stenosis at the distal anastomosis of the saphenous vein graft OM, likely a mechanical issue that has been present since the time of surgery.  This is unchanged over time and there is brisk TIMI-3 flow, very unlikely to be related to the patient's symptoms.   Diagnostic Dominance: Right Left Main  Ost LM to LM lesion is 100% stenosed. The left main is critically stenosed with minimal flow beyond the area of severe stenosis.  Left Circumflex  First Obtuse Marginal Branch  1st Mrg lesion is 60% stenosed.  Right Coronary Artery  Mid RCA to Dist RCA lesion is 100% stenosed.  RIMA RIMA Graft To RPDA  RIMA. The RIMA to PDA graft is patent. This is imaged from right radial access because of known severe tortuosity on the previous study. The graft is patent with no stenosis. The anastomosis is somewhat difficult to visualize but appears widely patent with no stenosis. The PDA and PLA branches fill from the graft.  Saphenous Graft To Ramus  SVG. The SVG to ramus intermedius has a critical degenerated 95% eccentric lesion with TIMI 2 flow beyond the lesion. The lesion is located in the proximal body of the graft.  Previously placed Prox Graft drug eluting stent is widely patent. The lesion is eccentric. An AL-1 guide catheter is used. Angiomax was used for coagulation. The right femoral sheath was upsized to a 6 [place]. A therapeutic ACT is achieved. The lesion is crossed with cougar wire. Verapamil was administered through the guide once the bypass graft is selectively engaged. The lesion is predilated with a 2.5 mm balloon. Verapamil was  administered again. A 5 mm spider distal embolic protection device was prepped and advanced beyond the lesion. The lesion is then stented with a 4.0 x 16 mm Promus DES deployed at 14 atm. The spider embolic protection device is removed and final angiography confirmed 0% residual stenosis and TIMI-3 flow.  Saphenous Graft To 1st Mrg  SVG. The SVG to OM is patent with diffuse irregularity. The OM branch where the vessel is tented up has an unusual appearance just beyond the distal anastomosis. There is at least 50-70% stenosis in that region. This is unchanged from the previous study.  LIMA LIMA Graft To Mid LAD  LIMA. The LIMA to mid LAD is widely patent with no stenosis. The LAD beyond the LIMA insertion site is patent. This is unchanged from the previous study.  Saphenous Graft To 1st Diag  SVG. SVG to diagonal is widely patent. The stented segment in the proximal body of the graft is widely patent.  Prox Graft to Mid Graft lesion is 20% stenosed. The lesion was previously treated using a drug eluting stent over 2 years ago.  Intervention   No interventions have been documented. Coronary Diagrams    Coronary Stent Intervention   04/2017  Left Heart Cath and Cors/Grafts Angiography  Conclusion   1. Severe native coronary artery disease with total  occlusion of the left main and total occlusion of the RCA 2. Status post aortocoronary bypass surgery with continued patency of the LIMA to LAD and RIMA to RCA, patency of the saphenous vein graft to OM1 with a moderate eccentric lesion just distal to the graft insertion site, and patency of the saphenous vein graft to ramus intermedius with a critical lesion in the proximal body the graft treated successfully with PCI using distal embolic protection and a 4.0 mm drug-eluting stent   Recommendations: The patient will be hydrated for 6 hours. He will be discharged this evening with follow-up labs next week. Recommend aspirin, Plavix, and Eliquis 1 month,  then discontinue aspirin. He should start back on Eliquis tomorrow evening.      Carotid dopplers 11/21/19 Summary:  Right Carotid: Velocities in the right ICA are consistent with a 1-39%  stenosis.   Left Carotid: Velocities in the left ICA are consistent with a low end  60-79%               stenosis. The ECA appears >50% stenosed.   Vertebrals: Bilateral vertebral arteries demonstrate antegrade flow.       Echocardiogram: 08/2017 Left ventricle: The cavity size was normal. There was moderate   focal basal hypertrophy of the septum with otherwise moderate   concentric hypertrophy. Systolic function was normal. The   estimated ejection fraction was in the range of 55% to 60%. Wall   motion was normal; there were no regional wall motion   abnormalities. The study is not technically sufficient to allow   evaluation of LV diastolic function. - Aortic valve: Trileaflet; severely thickened, severely calcified   leaflets. The non-coronary cusp is most affected. Noncoronary   cusp mobility was severely restricted. Valve area (VTI): 2.39   cm^2. Valve area (Vmax): 2.36 cm^2. Valve area (Vmean): 1.85   cm^2. - Mitral valve: Moderately calcified annulus. Transvalvular   velocity was within the normal range. There was no evidence for   stenosis. There was mild regurgitation. - Left atrium: The atrium was moderately dilated. - Right ventricle: The cavity size was normal. Wall thickness was   normal. Systolic function was normal. - Atrial septum: No defect or patent foramen ovale was identified   by color flow Doppler. - Tricuspid valve: There was trivial regurgitation. - Pulmonary arteries: Systolic pressure was mildly increased. PA   peak pressure: 41 mm Hg (S).   ------------------------------------------------------------------- Labs, prior tests, procedures, and surgery: Coronary artery bypass grafting.    Risk Assessment/Calculations:    CHA2DS2-VASc Score = 4   This  indicates a 4.8% annual risk of stroke. The patient's score is based upon: CHF History: 0 HTN History: 1 Diabetes History: 0 Stroke History: 0 Vascular Disease History: 1 Age Score: 2 Gender Score: 0       ASSESSMENT:    1. Coronary artery disease involving native coronary artery of native heart with angina pectoris (HCC)   2. Paroxysmal atrial fibrillation ([place])   3. Pacemaker   4. Essential hypertension   5. PVC's (premature ventricular contractions)   6. Other hyperlipidemia   7. CKD (chronic kidney disease) stage 4, GFR 15-29 ml/min (HCC)      PLAN:  In order of problems listed above:  CAD status post CABG last cath 04/2020 grafts patent medical therapy recommended no targets for intervention. No angina  Permanent atrial fibrillation on Eliquis 2.5 mg bid based on CKD and age-rates still in upper 80's and 90's at rest. Complains of fatigue  with little exertion since in Afib, will try to increase coreg 6.25 mg bid for better rate control. No bleeding problems. Crt/Hgb stable in 06/2021. To see Dr. Jenny Reichmann today  Permanent pacemaker managed by [name]  Hypertension-controlled  History of ablation for PVCs  Hyperlipidemia LDL 60 09/2020-due for repeat  AAA (3.5 cm)and carotid disease (26-94% LICA followed by Dr. Heywood Iles an appt coming up.  CKD stage III-IV-followed by Dr. Servando Salina. Crt 1.93 06/2021   Shared Decision Making/Informed Consent        Medication Adjustments/Labs and Tests Ordered: Current medicines are reviewed at length with the patient today.  Concerns regarding medicines are outlined above.  Medication changes, Labs and Tests ordered today are listed in the Patient Instructions below. Patient Instructions  Medication Instructions:  1.Increase carvedilol to 6.25 mg, take one tablet by mouth twice a day *If you need a refill on your cardiac medications before your next appointment, please call your pharmacy*   Lab Work: None If you have  labs (blood work) drawn today and your tests are completely normal, you will receive your results only by: [place] (if you have MyChart) OR A paper copy in the mail If you have any lab test that is abnormal or we need to change your treatment, we will call you to review the results.   Follow-Up: At [facility], you and your health needs are our priority.  As part of our continuing mission to provide you with exceptional heart care, we have created designated Provider Care Teams.  These Care Teams include your primary Cardiologist (physician) and Advanced Practice Providers (APPs -  Physician Assistants and Nurse Practitioners) who all work together to provide you with the care you need, when you need it.   Your next appointment:   01/09/21 at 8:20 AM  The format for your next appointment:   In Person  Provider:   [name], MD {  Dr Tanna Furry scheduler will give you a call to schedule an appointment for February or March   Sumner Boast, Hershal Coria  10/15/2021 11:49 AM    [address], [place], [place]  [address] Phone: [phone]; Fax: [phone]

## 2021-10-15 ENCOUNTER — Other Ambulatory Visit: Payer: Self-pay

## 2021-10-15 ENCOUNTER — Ambulatory Visit (INDEPENDENT_AMBULATORY_CARE_PROVIDER_SITE_OTHER): Payer: Medicare Other | Admitting: Physician Assistant

## 2021-10-15 ENCOUNTER — Ambulatory Visit (INDEPENDENT_AMBULATORY_CARE_PROVIDER_SITE_OTHER): Payer: Medicare Other | Admitting: Internal Medicine

## 2021-10-15 ENCOUNTER — Telehealth: Payer: Self-pay | Admitting: Internal Medicine

## 2021-10-15 ENCOUNTER — Encounter: Payer: Self-pay | Admitting: Physician Assistant

## 2021-10-15 ENCOUNTER — Encounter: Payer: Self-pay | Admitting: Internal Medicine

## 2021-10-15 VITALS — BP 124/78 | HR 78 | Resp 18 | Ht 74.0 in | Wt 214.4 lb

## 2021-10-15 VITALS — BP 122/82 | HR 89 | Ht 74.0 in | Wt 214.0 lb

## 2021-10-15 DIAGNOSIS — E1122 Type 2 diabetes mellitus with diabetic chronic kidney disease: Secondary | ICD-10-CM

## 2021-10-15 DIAGNOSIS — E538 Deficiency of other specified B group vitamins: Secondary | ICD-10-CM

## 2021-10-15 DIAGNOSIS — N1832 Chronic kidney disease, stage 3b: Secondary | ICD-10-CM

## 2021-10-15 DIAGNOSIS — I25119 Atherosclerotic heart disease of native coronary artery with unspecified angina pectoris: Secondary | ICD-10-CM

## 2021-10-15 DIAGNOSIS — E559 Vitamin D deficiency, unspecified: Secondary | ICD-10-CM | POA: Diagnosis not present

## 2021-10-15 DIAGNOSIS — Z950 Presence of cardiac pacemaker: Secondary | ICD-10-CM

## 2021-10-15 DIAGNOSIS — E7849 Other hyperlipidemia: Secondary | ICD-10-CM | POA: Diagnosis not present

## 2021-10-15 DIAGNOSIS — I10 Essential (primary) hypertension: Secondary | ICD-10-CM

## 2021-10-15 DIAGNOSIS — R5383 Other fatigue: Secondary | ICD-10-CM

## 2021-10-15 DIAGNOSIS — I6522 Occlusion and stenosis of left carotid artery: Secondary | ICD-10-CM

## 2021-10-15 DIAGNOSIS — I480 Paroxysmal atrial fibrillation: Secondary | ICD-10-CM

## 2021-10-15 DIAGNOSIS — I493 Ventricular premature depolarization: Secondary | ICD-10-CM

## 2021-10-15 DIAGNOSIS — E7800 Pure hypercholesterolemia, unspecified: Secondary | ICD-10-CM | POA: Diagnosis not present

## 2021-10-15 DIAGNOSIS — N184 Chronic kidney disease, stage 4 (severe): Secondary | ICD-10-CM

## 2021-10-15 LAB — HEMOGLOBIN A1C: Hgb A1c MFr Bld: 7 % — ABNORMAL HIGH (ref 4.6–6.5)

## 2021-10-15 LAB — URINALYSIS, ROUTINE W REFLEX MICROSCOPIC
Bilirubin Urine: NEGATIVE
Hgb urine dipstick: NEGATIVE
Ketones, ur: NEGATIVE
Leukocytes,Ua: NEGATIVE
Nitrite: NEGATIVE
RBC / HPF: NONE SEEN (ref 0–?)
Specific Gravity, Urine: 1.025 (ref 1.000–1.030)
Total Protein, Urine: NEGATIVE
Urine Glucose: NEGATIVE
Urobilinogen, UA: 0.2 (ref 0.0–1.0)
pH: 6 (ref 5.0–8.0)

## 2021-10-15 LAB — BASIC METABOLIC PANEL
BUN: 29 mg/dL — ABNORMAL HIGH (ref 6–23)
CO2: 28 mEq/L (ref 19–32)
Calcium: 8.6 mg/dL (ref 8.4–10.5)
Chloride: 106 mEq/L (ref 96–112)
Creatinine, Ser: 1.72 mg/dL — ABNORMAL HIGH (ref 0.40–1.50)
GFR: 36.62 mL/min — ABNORMAL LOW (ref >60.00)
Glucose, Bld: 89 mg/dL (ref 70–99)
Potassium: 4.7 mEq/L (ref 3.5–5.1)
Sodium: 139 mEq/L (ref 135–145)

## 2021-10-15 LAB — VITAMIN B12: Vitamin B-12: >1550 pg/mL — ABNORMAL HIGH (ref 211–911)

## 2021-10-15 LAB — MICROALBUMIN / CREATININE URINE RATIO
Creatinine,U: 202.7 mg/dL
Microalb Creat Ratio: 3 mg/g (ref 0.0–30.0)
Microalb, Ur: 6.2 mg/dL — ABNORMAL HIGH (ref 0.0–1.9)

## 2021-10-15 LAB — CBC WITH DIFFERENTIAL/PLATELET
Basophils Absolute: 0.1 10*3/uL (ref 0.0–0.1)
Basophils Relative: 1.2 % (ref 0.0–3.0)
Eosinophils Absolute: 0.6 10*3/uL (ref 0.0–0.7)
Eosinophils Relative: 10.3 % — ABNORMAL HIGH (ref 0.0–5.0)
HCT: 39 % (ref 39.0–52.0)
Hemoglobin: 13 g/dL (ref 13.0–17.0)
Lymphocytes Relative: 28.6 % (ref 12.0–46.0)
Lymphs Abs: 1.6 10*3/uL (ref 0.7–4.0)
MCHC: 33.4 g/dL (ref 30.0–36.0)
MCV: 98.5 fl (ref 78.0–100.0)
Monocytes Absolute: 0.5 10*3/uL (ref 0.1–1.0)
Monocytes Relative: 9.8 % (ref 3.0–12.0)
Neutro Abs: 2.7 10*3/uL (ref 1.4–7.7)
Neutrophils Relative %: 50.1 % (ref 43.0–77.0)
Platelets: 103 10*3/uL — ABNORMAL LOW (ref 150.0–400.0)
RBC: 3.96 Mil/uL — ABNORMAL LOW (ref 4.22–5.81)
RDW: 13.5 % (ref 11.5–15.5)
WBC: 5.4 10*3/uL (ref 4.0–10.5)

## 2021-10-15 LAB — LIPID PANEL
Cholesterol: 111 mg/dL (ref 0–200)
HDL: 41.4 mg/dL (ref >39.00)
LDL Cholesterol: 51 mg/dL (ref 0–99)
NonHDL: 69.91
Total CHOL/HDL Ratio: 3
Triglycerides: 97 mg/dL (ref 0.0–149.0)
VLDL: 19.4 mg/dL (ref 0.0–40.0)

## 2021-10-15 LAB — TSH: TSH: 1.59 u[IU]/mL (ref 0.35–5.50)

## 2021-10-15 LAB — HEPATIC FUNCTION PANEL
ALT: 19 U/L (ref 0–53)
AST: 21 U/L (ref 0–37)
Albumin: 4 g/dL (ref 3.5–5.2)
Alkaline Phosphatase: 69 U/L (ref 39–117)
Bilirubin, Direct: 0.2 mg/dL (ref 0.0–0.3)
Total Bilirubin: 0.8 mg/dL (ref 0.2–1.2)
Total Protein: 6.3 g/dL (ref 6.0–8.3)

## 2021-10-15 LAB — VITAMIN D 25 HYDROXY (VIT D DEFICIENCY, FRACTURES): VITD: 59.77 ng/mL (ref 30.00–100.00)

## 2021-10-15 MED ORDER — CARVEDILOL 6.25 MG PO TABS: 6.2500 mg | ORAL_TABLET | Freq: Two times a day (BID) | ORAL | 3 refills | Status: DC

## 2021-10-15 NOTE — Telephone Encounter (Signed)
Attempted to reach patient to discuss carvedilol. Per [name], PA-C, patient is to increase his carvedilol to 6.125 mg by mouth twice a day as discussed at office visit today.

## 2021-10-15 NOTE — Telephone Encounter (Signed)
Pt was seen by Thomas Marshall today In office, pt was advised to increases Carvedilol 6.1mg  2X per day. Pt wants to know if this is okay. Pt is currently at his PCP's office.

## 2021-10-15 NOTE — Patient Instructions (Addendum)
Medication Instructions:  1.Increase carvedilol to 6.25 mg, take one tablet by mouth twice a day *If you need a refill on your cardiac medications before your next appointment, please call your pharmacy*   Lab Work: None If you have labs (blood work) drawn today and your tests are completely normal, you will receive your results only by: [place] (if you have MyChart) OR A paper copy in the mail If you have any lab test that is abnormal or we need to change your treatment, we will call you to review the results.   Follow-Up: At [facility], you and your health needs are our priority.  As part of our continuing mission to provide you with exceptional heart care, we have created designated Provider Care Teams.  These Care Teams include your primary Cardiologist (physician) and Advanced Practice Providers (APPs -  Physician Assistants and Nurse Practitioners) who all work together to provide you with the care you need, when you need it.   Your next appointment:   01/09/21 at 8:20 AM  The format for your next appointment:   In Person  Provider:   [name], MD {  Dr Tanna Furry scheduler will give you a call to schedule an appointment for February or March

## 2021-10-15 NOTE — Telephone Encounter (Signed)
Patient returned my call. He just wanted to clarify carvedilol dose change from today's office visit with [name]. I confirmed with him that Selinda Eon did want him to increase his dose from 3.125 mg twice a day to 6.25 mg by mouth twice a day. He says that he recalls being on a higher dosage of this medication prescribed by Dr Lovena Le about 10 years ago and it dropped his HR which is why he took him off of the medication. He says that he checks his HR every morning and periodically throughout the day and it is normally in the high 90's in the morning when he first gets up and then during the day it can range 70's-90's. He agrees to try the 6.25 mg twice a day and he will call and report to [place] if he starts to get low HR readings.

## 2021-10-15 NOTE — Progress Notes (Signed)
Patient ID: Thomas Marshall, male   DOB: [birth year], 80 y.o.   MRN: [id number]        Chief Complaint: follow up HTN, HLD and hyperglycemia, fatigue       HPI:  Thomas Marshall is a 80 y.o. male here with c/o 2-3 mo onset worsening fatigue, energy, lack of stamina; had coreg increased to 6.25 bid per cardiology earlier today and fears it will make it worse; has some mild daytime somnolence and has deferred on recommended sleep apnea testing at the [place] so far, but now thinks he may do this, has f/u appt next month.  Pt denies chest pain, increased sob or doe, wheezing, orthopnea, PND, increased LE swelling, palpitations, dizziness or syncope.   Pt denies polydipsia, polyuria, or new focal neuro s/s.  Denies worsening depressive symptoms, suicidal ideation, or panic.  CBG's have been in the 120-130 range recently.  Sees renal every 6 month, last time 5 mo ago with stable fxn overall per pt report.  Pt denies fever, wt loss, night sweats, loss of appetite, or other constitutional symptoms  No other new complaints         Wt Readings from Last 3 Encounters:  10/15/21 214 lb 6.4 oz (97.3 kg)  10/15/21 214 lb (97.1 kg)  04/23/21 217 lb (98.4 kg)   BP Readings from Last 3 Encounters:  10/15/21 124/78  10/15/21 122/82  04/23/21 (!) 146/87         Past Medical History:  Diagnosis Date   AAA (abdominal aortic aneurysm)    Arthritis    Cancer ([place])    skin & squamous cell   Carotid artery disease ([place])    a. s/p R CEA 12/2015.   CKD (chronic kidney disease), stage III ([place])    a. per historical labs.   Coronary artery disease    a. s/p CABG 2004.   Diabetes mellitus without complication (HCC)    GERD (gastroesophageal reflux disease)    Hyperlipidemia    Hypertension    Medication intolerance Multiple    PAF (paroxysmal atrial fibrillation) (HCC)    Pneumonia    Sinus bradycardia    a. baseline HR 50s-60s, also h/o bradycardia on metoprolol and carvedilol   Past Surgical History:  Procedure  Laterality Date   ANKLE SURGERY     right fused   BACK SURGERY     low back    CARDIOVERSION N/A 02/14/2020   Procedure: CARDIOVERSION;  Surgeon: Geralynn Rile, MD;  Location: [facility];  Service: Cardiovascular;  Laterality: N/A;   CARDIOVERSION N/A 02/26/2020   Procedure: CARDIOVERSION;  Surgeon: [name], MD;  Location: [facility] ENDOSCOPY;  Service: Cardiovascular;  Laterality: N/A;   CARDIOVERSION N/A 04/12/2020   Procedure: CARDIOVERSION;  Surgeon: Skeet Latch, MD;  Location: [facility] ENDOSCOPY;  Service: Cardiovascular;  Laterality: N/A;   CHOLECYSTECTOMY N/A 12/07/2013   Procedure: LAPAROSCOPIC CHOLECYSTECTOMY ;  Surgeon: [name], MD;  Location: [facility] OR;  Service: General;  Laterality: N/A;   COLONOSCOPY     CORONARY ARTERY BYPASS GRAFT  2004   [place] N/A 05/21/2017   Procedure: Coronary Stent Intervention;  Surgeon: [name], MD;  Location: [place] CV LAB;  Service: Cardiovascular;  Laterality: N/A;   ENDARTERECTOMY Right 12/10/2015   Procedure: Right Carotid ENDARTERECTOMY;  Surgeon: Conrad Luck, MD;  Location: [place] [facility];  Service: Vascular;  Laterality: Right;   EXPLORATION POST OPERATIVE OPEN HEART     FINGER SURGERY  skin graft on rt index   FINGER SURGERY Right    right index finger.   HERNIA REPAIR Right    RIH   JOINT REPLACEMENT     KNEE SURGERY     replacement on both knees   LEFT HEART CATH AND CORS/GRAFTS ANGIOGRAPHY N/A 05/21/2017   Procedure: Left Heart Cath and Cors/Grafts Angiography;  Surgeon: [name], MD;  Location: [place] CV LAB;  Service: Cardiovascular;  Laterality: N/A;   LEFT HEART CATH AND CORS/GRAFTS ANGIOGRAPHY N/A 05/20/2020   Procedure: LEFT HEART CATH AND CORS/GRAFTS ANGIOGRAPHY;  Surgeon: [name], MD;  Location: [place] CV LAB;  Service: Cardiovascular;  Laterality: N/A;   PACEMAKER IMPLANT N/A 03/14/2020   Procedure: PACEMAKER IMPLANT;  Surgeon: Evans Lance,  MD;  Location: [place] CV LAB;  Service: Cardiovascular;  Laterality: N/A;   PROSTATE SURGERY     PVC ABLATION N/A 10/27/2017   Procedure: PVC ABLATION;  Surgeon: Evans Lance, MD;  Location: [place] CV LAB;  Service: Cardiovascular;  Laterality: N/A;   TONSILLECTOMY     URINARY SURGERY     scar tissue    reports that he quit smoking about 50 years ago. His smoking use included cigarettes. He has a 13.00 pack-year smoking history. He has never used smokeless tobacco. He reports that he does not drink alcohol and does not use drugs. family history includes Cancer in his sister; Diabetes in his brother and sister; Heart disease in his brother, father, and mother; Hyperlipidemia in his brother and father; Hypertension in his brother, father, and sister; Stroke in his mother. Allergies  Allergen Reactions   Lisinopril Swelling and Other (See Comments)    Angioedema.    Losartan Swelling and Other (See Comments)    Lips swell Angioedema    Nifedipine Swelling and Other (See Comments)    Sugar increase   Triamterene Swelling and Other (See Comments)    Face swells, no breathing impairment   Hydralazine Hcl Other (See Comments)    Fatigue; poor appetite   Losartan Potassium-Hctz Swelling and Other (See Comments)   Amoxicillin Rash and Other (See Comments)    Did it involve swelling of the face/tongue/throat, SOB, or low BP? No Did it involve sudden or severe rash/hives, skin peeling, or any reaction on the inside of your mouth or nose? No Did you need to seek medical attention at a hospital or doctor's office? No When did it last happen?      10 + years If all above answers are "NO", may proceed with cephalosporin use.     Ampicillin Rash and Other (See Comments)    Did it involve swelling of the face/tongue/throat, SOB, or low BP? No Did it involve sudden or severe rash/hives, skin peeling, or any reaction on the inside of your mouth or nose? No Did you need to seek medical  attention at a hospital or doctor's office? No When did it last happen?      10 + years If all above answers are "NO", may proceed with cephalosporin use.    Carvedilol Other (See Comments)    Low heart rate    Current Outpatient Medications on File Prior to Visit  Medication Sig Dispense Refill   ACCU-CHEK GUIDE test strip USE 1 NEW TEST STRIP ONCE DAILY TO TEST BLOOD SUGAR 50 each 0   Accu-Chek Softclix Lancets lancets USE 1 TWICE DAILY USE AS DIRECTED E11.9 100 each 3   apixaban (ELIQUIS) 2.5 MG TABS tablet  Take 1 tablet (2.5 mg total) by mouth 2 (two) times daily. (0800 & 2000) 180 tablet 3   aspirin EC 81 MG tablet Take 1 tablet (81 mg total) by mouth daily. 90 tablet 3   atorvastatin (LIPITOR) 40 MG tablet Take 40 mg by mouth at bedtime.      cholecalciferol (VITAMIN D3) 25 MCG (1000 UNIT) tablet Take 1,000 Units by mouth daily.     glucose blood test strip Accu-Check Guide Strips. Dx E11.9 Use to test blood glucose level 1 time a day. 100 each 11   hydrALAZINE (APRESOLINE) 10 MG tablet Take 10 mg by mouth 3 (three) times daily as needed (elevated bp greater than 160).     magnesium oxide (MAG-OX) 400 MG tablet Take 800 mg by mouth 2 (two) times daily.      meclizine (ANTIVERT) 25 MG tablet Take 1 tablet (25 mg total) by mouth 3 (three) times daily as needed for dizziness. 30 tablet 0   mupirocin ointment (BACTROBAN) 2 % Apply 1 application topically 2 (two) times daily. 30 g 2   nitroGLYCERIN (NITROSTAT) 0.4 MG SL tablet Place 1 tablet (0.4 mg total) under the tongue every 5 (five) minutes as needed for chest pain (x 3 doses). 30 tablet 3   terazosin (HYTRIN) 1 MG capsule Take 1 mg by mouth at bedtime.  30 capsule 11   vitamin C (ASCORBIC ACID) 500 MG tablet Take 500 mg by mouth daily with breakfast.      Zinc 22.5 MG TABS Take 22.5 mg by mouth every other day.     pantoprazole (PROTONIX) 40 MG tablet Take 1 tablet (40 mg total) by mouth daily. 90 tablet 3   No current  facility-administered medications on file prior to visit.        ROS:  All others reviewed and negative.  Objective        PE:  BP 124/78   Pulse 78   Resp 18   Ht 6\' 2"  (1.88 m)   Wt 214 lb 6.4 oz (97.3 kg)   SpO2 99%   BMI 27.53 kg/m                 Constitutional: Pt appears in NAD               HENT: Head: NCAT.                Right Ear: External ear normal.                 Left Ear: External ear normal.                Eyes: . Pupils are equal, round, and reactive to light. Conjunctivae and EOM are normal               Nose: without d/c or deformity               Neck: Neck supple. Gross normal ROM               Cardiovascular: Normal rate and regular rhythm.                 Pulmonary/Chest: Effort normal and breath sounds without rales or wheezing.                Abd:  Soft, NT, ND, + BS, no organomegaly               Neurological: Pt is alert. At baseline  orientation, motor grossly intact               Skin: Skin is warm. No rashes, no other new lesions, LE edema - none               Psychiatric: Pt behavior is normal without agitation   Micro: none  Cardiac tracings I have personally interpreted today:  none  Pertinent Radiological findings (summarize): none   Lab Results  Component Value Date   WBC 5.4 10/15/2021   HGB 13.0 10/15/2021   HCT 39.0 10/15/2021   PLT 103.0 (L) 10/15/2021   GLUCOSE 89 10/15/2021   CHOL 111 10/15/2021   TRIG 97.0 10/15/2021   HDL 41.40 10/15/2021   LDLDIRECT 67.0 09/16/2016   LDLCALC 51 10/15/2021   ALT 19 10/15/2021   AST 21 10/15/2021   NA 139 10/15/2021   K 4.7 10/15/2021   CL 106 10/15/2021   CREATININE 1.72 (H) 10/15/2021   BUN 29 (H) 10/15/2021   CO2 28 10/15/2021   TSH 1.59 10/15/2021   PSA 1.09 03/31/2018   INR 1.1 01/02/2020   HGBA1C 7.0 (H) 10/15/2021   MICROALBUR 6.2 (H) 10/15/2021   Assessment/Plan:  Thomas Marshall is a 80 y.o. White or Caucasian [1] male with  has a past medical history of AAA (abdominal  aortic aneurysm), Arthritis, Cancer ([place]), Carotid artery disease ([place]), CKD (chronic kidney disease), stage III ([place]), Coronary artery disease, Diabetes mellitus without complication ([place]), GERD (gastroesophageal reflux disease), Hyperlipidemia, Hypertension, Medication intolerance (Multiple ), PAF (paroxysmal atrial fibrillation) ([place]), Pneumonia, and Sinus bradycardia.  Type 2 diabetes mellitus with renal manifestations (HCC) Lab Results  Component Value Date   HGBA1C 7.0 (H) 10/15/2021   Stable, pt to continue current medical treatment  - diet, declines need for OHA for now   Hyperlipidemia Lab Results  Component Value Date   LDLCALC 51 10/15/2021   Stable, pt to continue current statin lipitor 40   CKD (chronic kidney disease) stage 3, GFR 30-59 ml/min (HCC) Lab Results  Component Value Date   CREATININE 1.72 (H) 10/15/2021   Stable overall, cont to avoid nephrotoxins   Essential hypertension BP Readings from Last 3 Encounters:  10/15/21 124/78  10/15/21 122/82  04/23/21 (!) 146/87   Stable, pt to continue medical treatment coreg, hydralazine   Fatigue Etiology unclear but subjectively worsening, ? Geriatric decline vs other, for labs today, pt reassured about increased coreg and should be able to tolerate this well and not make fatigue worse; also pt should f[facility]u at [facility] for sleep apnea testing,  to f/u any worsening symptoms or concerns  Followup: No follow-ups on file.  Cathlean Cower, MD 10/19/2021 7:11 AM [facility]

## 2021-10-15 NOTE — Patient Instructions (Signed)
Please consider follow up with the [facility] regarding the sleep apnea testing  Please continue all other medications as before, and refills have been done if requested.  Please have the pharmacy call with any other refills you may need.  Please continue your efforts at being more active, low cholesterol diet, and weight control.  You are otherwise up to date with prevention measures today.  Please keep your appointments with your specialists as you may have planned  Please go to the LAB at the blood drawing area for the tests to be done  You will be contacted by phone if any changes need to be made immediately.  Otherwise, you will receive a letter about your results with an explanation, but please check with MyChart first.  Please remember to sign up for MyChart if you have not done so, as this will be important to you in the future with finding out test results, communicating by private email, and scheduling acute appointments online when needed.  Please make an Appointment to return in 6 months, or sooner if needed

## 2021-10-19 NOTE — Assessment & Plan Note (Signed)
BP Readings from Last 3 Encounters:  10/15/21 124/78  10/15/21 122/82  04/23/21 (!) 146/87   Stable, pt to continue medical treatment coreg, hydralazine

## 2021-10-19 NOTE — Assessment & Plan Note (Signed)
Lab Results  Component Value Date   LDLCALC 51 10/15/2021   Stable, pt to continue current statin lipitor 40

## 2021-10-19 NOTE — Assessment & Plan Note (Signed)
Lab Results  Component Value Date   HGBA1C 7.0 (H) 10/15/2021   Stable, pt to continue current medical treatment  - diet, declines need for OHA for now

## 2021-10-19 NOTE — Assessment & Plan Note (Signed)
Lab Results  Component Value Date   CREATININE 1.72 (H) 10/15/2021   Stable overall, cont to avoid nephrotoxins

## 2021-10-19 NOTE — Assessment & Plan Note (Addendum)
Etiology unclear but subjectively worsening, ? Geriatric decline vs other, for labs today, pt reassured about increased coreg and should be able to tolerate this well and not make fatigue worse; also pt should f/u at [facility] for sleep apnea testing,  to f/u any worsening symptoms or concerns

## 2021-10-20 ENCOUNTER — Other Ambulatory Visit: Payer: Self-pay

## 2021-10-20 DIAGNOSIS — I714 Abdominal aortic aneurysm, without rupture, unspecified: Secondary | ICD-10-CM

## 2021-10-27 DIAGNOSIS — L570 Actinic keratosis: Secondary | ICD-10-CM | POA: Diagnosis not present

## 2021-11-05 ENCOUNTER — Telehealth: Payer: Self-pay

## 2021-11-05 DIAGNOSIS — I4811 Longstanding persistent atrial fibrillation: Secondary | ICD-10-CM | POA: Diagnosis not present

## 2021-11-05 DIAGNOSIS — D631 Anemia in chronic kidney disease: Secondary | ICD-10-CM | POA: Diagnosis not present

## 2021-11-05 DIAGNOSIS — N1832 Chronic kidney disease, stage 3b: Secondary | ICD-10-CM | POA: Diagnosis not present

## 2021-11-05 DIAGNOSIS — N2581 Secondary hyperparathyroidism of renal origin: Secondary | ICD-10-CM | POA: Diagnosis not present

## 2021-11-05 DIAGNOSIS — I129 Hypertensive chronic kidney disease with stage 1 through stage 4 chronic kidney disease, or unspecified chronic kidney disease: Secondary | ICD-10-CM | POA: Diagnosis not present

## 2021-11-05 DIAGNOSIS — R809 Proteinuria, unspecified: Secondary | ICD-10-CM | POA: Diagnosis not present

## 2021-11-05 DIAGNOSIS — E1122 Type 2 diabetes mellitus with diabetic chronic kidney disease: Secondary | ICD-10-CM | POA: Diagnosis not present

## 2021-11-05 NOTE — Telephone Encounter (Signed)
Patient recently seen at [facility] and had labs done. B12 numbers are still high patient wondering should he still be taking B12 Vitamins

## 2021-11-05 NOTE — Telephone Encounter (Signed)
That sounds great, so he should be able to cut back on the B12 supplement to Mon - wed - Friday only

## 2021-11-06 ENCOUNTER — Ambulatory Visit (INDEPENDENT_AMBULATORY_CARE_PROVIDER_SITE_OTHER): Payer: Medicare Other | Admitting: Podiatry

## 2021-11-06 ENCOUNTER — Other Ambulatory Visit: Payer: Self-pay

## 2021-11-06 DIAGNOSIS — M79674 Pain in right toe(s): Secondary | ICD-10-CM | POA: Diagnosis not present

## 2021-11-06 DIAGNOSIS — M79675 Pain in left toe(s): Secondary | ICD-10-CM | POA: Diagnosis not present

## 2021-11-06 DIAGNOSIS — E1140 Type 2 diabetes mellitus with diabetic neuropathy, unspecified: Secondary | ICD-10-CM

## 2021-11-06 DIAGNOSIS — Z7901 Long term (current) use of anticoagulants: Secondary | ICD-10-CM

## 2021-11-06 DIAGNOSIS — B351 Tinea unguium: Secondary | ICD-10-CM

## 2021-11-06 NOTE — Telephone Encounter (Signed)
Patient notified

## 2021-11-09 NOTE — Progress Notes (Signed)
Patient ID: Thomas Marshall, male   DOB: [birth year], 80 y.o.   MRN: [id number]  Subjective: 80 y.o. returns the office today for painful, elongated, thickened toenails which he cannot trim himself and for calluses on the right foot.  Calluses of not becoming too tender or thick.  No open sores or reports.  No swelling redness or drainage.    He is on Eliquis  Last A1c was 7 on 10/15/2021  [name], MD Last seen 10/15/2021   Objective: AAO 3, NAD DP/PT pulses palpable, CRT less than 3 seconds Protective sensation decreased with [name] monofilament Nails hypertrophic, dystrophic, elongated, brittle, discolored 10. There is tenderness overlying the nails 1-5 bilaterally. There is no surrounding erythema or drainage along the nail sites. There is minimal hyperkeratotic lesion right fifth metatarsal base and fifth metatarsal head. No ulceration, drainage or any signs of infection.  No pain with calf compression, swelling, warmth, erythema.  Assessment: Patient presents with symptomatic onychomycosis; hyperkeratotic tissue  Plan: -Treatment options including alternatives, risks, complications were discussed -Nails sharply debrided 10 without complication/bleeding. -There is no significant hyperkeratotic tissue to debride today.  Continue moisturizer and offloading. -Discussed daily foot inspection. If there are any changes, to call the office immediately.  -Follow-up in 3 months or sooner if any problems are to arise. In the meantime, encouraged to call the office with any questions, concerns, changes symptoms.  Celesta Gentile, DPM

## 2021-11-20 ENCOUNTER — Other Ambulatory Visit: Payer: Self-pay

## 2021-11-20 ENCOUNTER — Ambulatory Visit (HOSPITAL_COMMUNITY)
Admission: RE | Admit: 2021-11-20 | Discharge: 2021-11-20 | Disposition: A | Discharge status: Home / Self Care | Payer: Medicare Other | Source: Ambulatory Visit | Attending: Vascular Surgery | Admitting: Vascular Surgery

## 2021-11-20 ENCOUNTER — Ambulatory Visit (HOSPITAL_COMMUNITY)
Admission: RE | Admit: 2021-11-20 | Discharge: 2021-11-20 | Disposition: A | Discharge status: Home / Self Care | Payer: Medicare Other | Source: Ambulatory Visit | Attending: Physician Assistant | Admitting: Physician Assistant

## 2021-11-20 ENCOUNTER — Ambulatory Visit (INDEPENDENT_AMBULATORY_CARE_PROVIDER_SITE_OTHER): Payer: Medicare Other | Admitting: Physician Assistant

## 2021-11-20 ENCOUNTER — Encounter (HOSPITAL_COMMUNITY): Payer: Self-pay

## 2021-11-20 VITALS — BP 137/85 | HR 84 | Temp 98.7°F | Wt 213.8 lb

## 2021-11-20 DIAGNOSIS — I7143 Infrarenal abdominal aortic aneurysm, without rupture: Secondary | ICD-10-CM | POA: Diagnosis not present

## 2021-11-20 DIAGNOSIS — I6523 Occlusion and stenosis of bilateral carotid arteries: Secondary | ICD-10-CM | POA: Insufficient documentation

## 2021-11-20 DIAGNOSIS — I6522 Occlusion and stenosis of left carotid artery: Secondary | ICD-10-CM

## 2021-11-20 DIAGNOSIS — I714 Abdominal aortic aneurysm, without rupture, unspecified: Secondary | ICD-10-CM | POA: Insufficient documentation

## 2021-11-20 NOTE — Progress Notes (Signed)
Office Note     CC:  follow up Requesting Provider:  [name], MD  HPI: Thomas Marshall is a 80 y.o. ([birth year]) male who presents for follow up of carotid artery stenosis and AAA. We have been following a moderate 60 to 79% left carotid stenosis. He has remote history of right CEA in January of 2017 by Dr. Bridgett Larsson. Patient also has a known small abdominal aortic aneurysm which was 3.5 cm on last duplex evaluation.  He denies any history of abdominal pain or back pain. He does have some chronic back pain with history of multiple surgeries but expresses no new or severe back pain.  He denies any history of stroke, TIAs, slurred speech, vision changes or amaurosis fugax.He denies any upper or lower extremity weakness or numbness. His wife who is present with him today reports that he fatigues very easily with doing simple things such as walking around grocery store or certainly with more labor intensive things like raking leaves. She is concerned this is related to his carotid disease.    He is on aspirin and is on a statin.  He is on Eliquis for atrial fibrillation.  He is not a smoker  The pt is on a statin for cholesterol management.  The pt is on a daily aspirin.   Other AC:  Eliquis for atrial fibrillation The pt is on BB and Hydralazine for hypertension.   The pt is diabetic Tobacco hx:  Former,  1972  Past Medical History:  Diagnosis Date   AAA (abdominal aortic aneurysm)    Arthritis    Cancer ([place])    skin & squamous cell   Carotid artery disease ([place])    a. s/p R CEA 12/2015.   CKD (chronic kidney disease), stage III ([place])    a. per historical labs.   Coronary artery disease    a. s/p CABG 2004.   Diabetes mellitus without complication (HCC)    GERD (gastroesophageal reflux disease)    Hyperlipidemia    Hypertension    Medication intolerance Multiple    PAF (paroxysmal atrial fibrillation) (HCC)    Pneumonia    Sinus bradycardia    a. baseline HR 50s-60s, also h/o  bradycardia on metoprolol and carvedilol    Past Surgical History:  Procedure Laterality Date   ANKLE SURGERY     right fused   BACK SURGERY     low back    CARDIOVERSION N/A 02/14/2020   Procedure: CARDIOVERSION;  Surgeon: Geralynn Rile, MD;  Location: [place];  Service: Cardiovascular;  Laterality: N/A;   CARDIOVERSION N/A 02/26/2020   Procedure: CARDIOVERSION;  Surgeon: [name], MD;  Location: [facility] ENDOSCOPY;  Service: Cardiovascular;  Laterality: N/A;   CARDIOVERSION N/A 04/12/2020   Procedure: CARDIOVERSION;  Surgeon: Skeet Latch, MD;  Location: [facility] ENDOSCOPY;  Service: Cardiovascular;  Laterality: N/A;   CHOLECYSTECTOMY N/A 12/07/2013   Procedure: LAPAROSCOPIC CHOLECYSTECTOMY ;  Surgeon: [name], MD;  Location: [facility] OR;  Service: General;  Laterality: N/A;   COLONOSCOPY     CORONARY ARTERY BYPASS GRAFT  2004   [place] N/A 05/21/2017   Procedure: Coronary Stent Intervention;  Surgeon: [name], MD;  Location: [place] CV LAB;  Service: Cardiovascular;  Laterality: N/A;   ENDARTERECTOMY Right 12/10/2015   Procedure: Right Carotid ENDARTERECTOMY;  Surgeon: Conrad , MD;  Location: [place];  Service: Vascular;  Laterality: Right;   EXPLORATION POST OPERATIVE OPEN HEART  FINGER SURGERY     skin graft on rt index   FINGER SURGERY Right    right index finger.   HERNIA REPAIR Right    RIH   JOINT REPLACEMENT     KNEE SURGERY     replacement on both knees   LEFT HEART CATH AND CORS/GRAFTS ANGIOGRAPHY N/A 05/21/2017   Procedure: Left Heart Cath and Cors/Grafts Angiography;  Surgeon: [name], MD;  Location: [place] [facility];  Service: Cardiovascular;  Laterality: N/A;   LEFT HEART CATH AND CORS/GRAFTS ANGIOGRAPHY N/A 05/20/2020   Procedure: LEFT HEART CATH AND CORS/GRAFTS ANGIOGRAPHY;  Surgeon: [name], MD;  Location: [place] [facility];  Service: Cardiovascular;  Laterality: N/A;   PACEMAKER  IMPLANT N/A 03/14/2020   Procedure: PACEMAKER IMPLANT;  Surgeon: Evans Lance, MD;  Location: [place] [facility];  Service: Cardiovascular;  Laterality: N/A;   PROSTATE SURGERY     PVC ABLATION N/A 10/27/2017   Procedure: PVC ABLATION;  Surgeon: Evans Lance, MD;  Location: [place] [facility] LAB;  Service: Cardiovascular;  Laterality: N/A;   TONSILLECTOMY     URINARY SURGERY     scar tissue    Social History   Socioeconomic History   Marital status: Married    Spouse name: Not on file   Number of children: 2   Years of education: Not on file   Highest education level: Not on file  Occupational History   Occupation: retired  Tobacco Use   Smoking status: Former    Packs/day: 1.00    Years: 13.00    Pack years: 13.00    Types: Cigarettes    Quit date: 11/30/1970    Years since quitting: 51.0   Smokeless tobacco: Never  Vaping Use   Vaping Use: Never used  Substance and Sexual Activity   Alcohol use: No    Alcohol/week: 0.0 standard drinks   Drug use: No   Sexual activity: Yes  Other Topics Concern   Not on file  Social History Narrative   Not on file   Social Determinants of Health   Financial Resource Strain: Not on file  Food Insecurity: Not on file  Transportation Needs: Not on file  Physical Activity: Not on file  Stress: Not on file  Social Connections: Not on file  Intimate Partner Violence: Not on file    Family History  Problem Relation Age of Onset   Stroke Mother    Heart disease Mother    Heart disease Father    Hypertension Father    Hyperlipidemia Father    Cancer Sister        breast  and skin   Diabetes Sister    Hypertension Sister    Diabetes Brother    Heart disease Brother    Hyperlipidemia Brother    Hypertension Brother    Early death Neg Hx     Current Outpatient Medications  Medication Sig Dispense Refill   ACCU-CHEK GUIDE test strip USE 1 NEW TEST STRIP ONCE DAILY TO TEST BLOOD SUGAR 50 each 0   Accu-Chek Softclix Lancets  lancets USE 1 TWICE DAILY USE AS DIRECTED E11.9 100 each 3   apixaban (ELIQUIS) 2.5 MG TABS tablet Take 1 tablet (2.5 mg total) by mouth 2 (two) times daily. (0800 & 2000) 180 tablet 3   aspirin EC 81 MG tablet Take 1 tablet (81 mg total) by mouth daily. 90 tablet 3   atorvastatin (LIPITOR) 40 MG tablet Take 40 mg by mouth  at bedtime.      carvedilol (COREG) 6.25 MG tablet Take 1 tablet (6.25 mg total) by mouth 2 (two) times daily. 180 tablet 3   cholecalciferol (VITAMIN D3) 25 MCG (1000 UNIT) tablet Take 1,000 Units by mouth daily.     glucose blood test strip Accu-Check Guide Strips. Dx E11.9 Use to test blood glucose level 1 time a day. 100 each 11   hydrALAZINE (APRESOLINE) 10 MG tablet Take 10 mg by mouth 3 (three) times daily as needed (elevated bp greater than 160).     magnesium oxide (MAG-OX) 400 MG tablet Take 800 mg by mouth 2 (two) times daily.      meclizine (ANTIVERT) 25 MG tablet Take 1 tablet (25 mg total) by mouth 3 (three) times daily as needed for dizziness. 30 tablet 0   mupirocin ointment (BACTROBAN) 2 % Apply 1 application topically 2 (two) times daily. 30 g 2   nitroGLYCERIN (NITROSTAT) 0.4 MG SL tablet Place 1 tablet (0.4 mg total) under the tongue every 5 (five) minutes as needed for chest pain (x 3 doses). 30 tablet 3   terazosin (HYTRIN) 1 MG capsule Take 1 mg by mouth at bedtime.  30 capsule 11   vitamin C (ASCORBIC ACID) 500 MG tablet Take 500 mg by mouth daily with breakfast.      Zinc 22.5 MG TABS Take 22.5 mg by mouth every other day.     pantoprazole (PROTONIX) 40 MG tablet Take 1 tablet (40 mg total) by mouth daily. 90 tablet 3   No current facility-administered medications for this visit.    Allergies  Allergen Reactions   Lisinopril Swelling and Other (See Comments)    Angioedema.    Losartan Swelling and Other (See Comments)    Lips swell Angioedema    Nifedipine Swelling and Other (See Comments)    Sugar increase   Triamterene Swelling and Other  (See Comments)    Face swells, no breathing impairment   Hydralazine Hcl Other (See Comments)    Fatigue; poor appetite   Losartan Potassium-Hctz Swelling and Other (See Comments)   Amoxicillin Rash and Other (See Comments)    Did it involve swelling of the face/tongue/throat, SOB, or low BP? No Did it involve sudden or severe rash/hives, skin peeling, or any reaction on the inside of your mouth or nose? No Did you need to seek medical attention at a hospital or doctor's office? No When did it last happen?      10 + years If all above answers are NO, may proceed with cephalosporin use.     Ampicillin Rash and Other (See Comments)    Did it involve swelling of the face/tongue/throat, SOB, or low BP? No Did it involve sudden or severe rash/hives, skin peeling, or any reaction on the inside of your mouth or nose? No Did you need to seek medical attention at a hospital or doctor's office? No When did it last happen?      10 + years If all above answers are "NO", may proceed with cephalosporin use.    Carvedilol Other (See Comments)    Low heart rate      REVIEW OF SYSTEMS:  [X]  denotes positive finding, [ ]  denotes negative finding Cardiac  Comments:  Chest pain or chest pressure:    Shortness of breath upon exertion:    Short of breath when lying flat:    Irregular heart rhythm:        Vascular    Pain  in calf, thigh, or hip brought on by ambulation:    Pain in feet at night that wakes you up from your sleep:     Blood clot in your veins:    Leg swelling:         Pulmonary    Oxygen at home:    Productive cough:     Wheezing:         Neurologic    Sudden weakness in arms or legs:     Sudden numbness in arms or legs:     Sudden onset of difficulty speaking or slurred speech:    Temporary loss of vision in one eye:     Problems with dizziness:         Gastrointestinal    Blood in stool:     Vomited blood:         Genitourinary    Burning when urinating:      Blood in urine:        Psychiatric    Major depression:         Hematologic    Bleeding problems:    Problems with blood clotting too easily:        Skin    Rashes or ulcers:        Constitutional    Fever or chills:      PHYSICAL EXAMINATION:  Vitals:   11/20/21 0909  BP: 137/85  Pulse: 84  Temp: 98.7 F (37.1 C)  TempSrc: Skin  SpO2: 99%  Weight: 213 lb 12.8 oz (97 kg)    General:  WDWN in NAD; vital signs documented above Gait: Normal HENT: WNL, normocephalic Pulmonary: normal non-labored breathing , without wheezing Cardiac: regular HR, without  Murmurs without carotid bruit Abdomen: soft, NT, no masses. No palpable abdominal aortic pulse Vascular Exam/Pulses:  Right Left  Radial 2+ (normal) 2+ (normal)  Femoral 2+ (normal) 2+ (normal)  Popliteal Not palpable Not palpable  DP 2+ (normal) 1+ (weak)  PT 1+ (weak) 1+ (weak)   Extremities: without ischemic changes, without Gangrene , without cellulitis; without open wounds;  Musculoskeletal: no muscle wasting or atrophy  Neurologic: A&O X 3;  No focal weakness or paresthesias are detected Psychiatric:  The pt has Normal affect.   Non-Invasive Vascular Imaging:   Summary:  Right Carotid: Velocities in the right ICA are consistent with a 1-39% stenosis.   Left Carotid: Velocities in the left ICA are consistent with a 80-99% stenosis.   Vertebrals:  Bilateral vertebral arteries demonstrate antegrade flow.  Subclavians: Normal flow hemodynamics were seen in bilateral subclavian arteries.    ASSESSMENT/PLAN:: 80 y.o. male here for follow up for carotid stenosis and small AAA. He remains without any associated neurological symptoms. His duplex today now shows stable right ICA stenosis of 1-39%, left ICA is now in the 80-99% stenosis range with EDV 145 cm/s. This has increase from 60-79%  on his prior duplex in May. He did not have his AAA duplex today. Last study in May of 2022 showed stable aneurysm at 3.5 cm.  Based on this size he can have repeat duplex in 2 years. He has no associated symptoms.  He understands we would not consider elective repair unless the aneurysm reaches 5.5 cm in maximum diameter  - discussed importance of adequate blood pressure control - Continue Aspirin, Statin and Eliquis - Patient has CKD Stage III, most recent Scr 1.72 in November. He is followed by Dr. Marval Regal for this. Will try to obtain a  CTA neck. Hopefully they can use limited contrast to obtain images  - Will schedule CTA neck in near future and patient will follow up with Dr. Scot Dock after to review results and discuss surgical intervention   Karoline Caldwell, PA-C [facility] [phone]  Clinic MD:   Dr. Scot Dock

## 2021-11-25 ENCOUNTER — Other Ambulatory Visit: Payer: Self-pay | Admitting: Vascular Surgery

## 2021-11-25 DIAGNOSIS — I6529 Occlusion and stenosis of unspecified carotid artery: Secondary | ICD-10-CM

## 2021-11-26 ENCOUNTER — Ambulatory Visit
Admission: RE | Admit: 2021-11-26 | Discharge: 2021-11-26 | Disposition: A | Discharge status: Home / Self Care | Payer: Medicare Other | Source: Ambulatory Visit | Attending: Vascular Surgery | Admitting: Vascular Surgery

## 2021-11-26 DIAGNOSIS — I6523 Occlusion and stenosis of bilateral carotid arteries: Secondary | ICD-10-CM | POA: Diagnosis not present

## 2021-11-26 DIAGNOSIS — M47812 Spondylosis without myelopathy or radiculopathy, cervical region: Secondary | ICD-10-CM | POA: Diagnosis not present

## 2021-11-26 DIAGNOSIS — I672 Cerebral atherosclerosis: Secondary | ICD-10-CM | POA: Diagnosis not present

## 2021-11-26 DIAGNOSIS — I6529 Occlusion and stenosis of unspecified carotid artery: Secondary | ICD-10-CM

## 2021-11-26 DIAGNOSIS — I700 Atherosclerosis of aorta: Secondary | ICD-10-CM | POA: Diagnosis not present

## 2021-11-26 IMAGING — CT CT ANGIO NECK
2 of 3 series · 9 of 32 positions shown, 14 images · IV contrast (iopamidol)
Comparison: Head and neck CTA [DATE]

CLINICAL DATA: Carotid artery stenosis.

EXAM:
CT ANGIOGRAPHY NECK
TECHNIQUE: Multidetector CT imaging of the neck was performed using the
standard protocol during bolus administration of intravenous
contrast. Multiplanar CT image reconstructions and MIPs were
obtained to evaluate the vascular anatomy. Carotid stenosis
measurements (when applicable) are obtained utilizing NASCET
criteria, using the distal internal carotid diameter as the
denominator.
CONTRAST:  60mL [HK] IOPAMIDOL ([HK]) INJECTION 76%

[Series 5: carotid angio · axial · 0.61mm/px · z∈[-268,-58]mm · 7 of 141 slices shown, 12 images]
[im 18/141  soft-tissue]
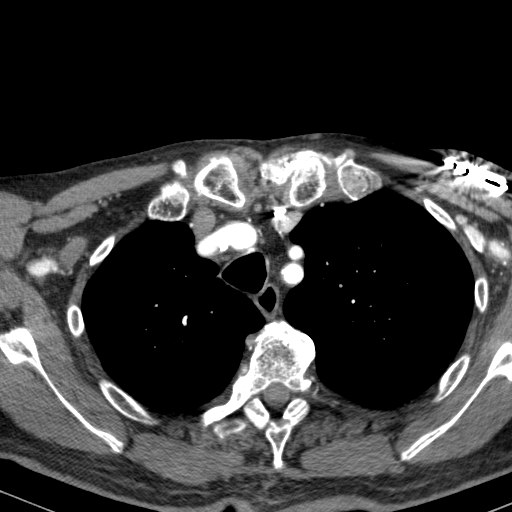
[im 18/141  bone]
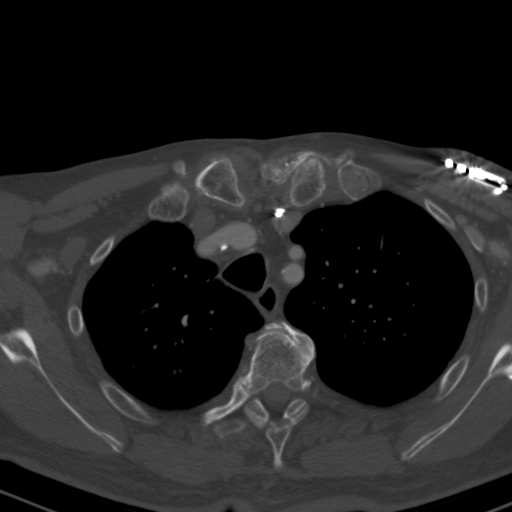
[im 36/141  soft-tissue]
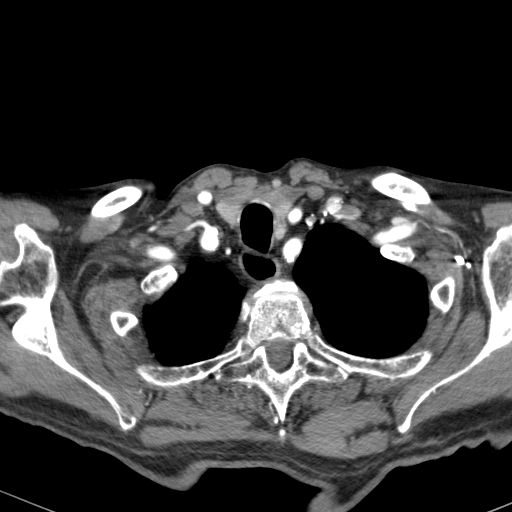
[im 53/141  soft-tissue]
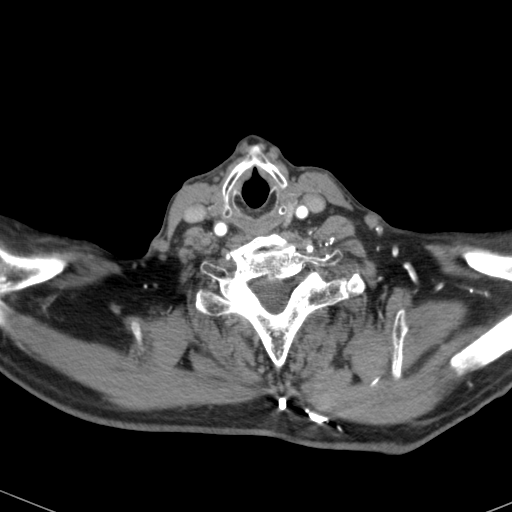
[im 71/141  soft-tissue]
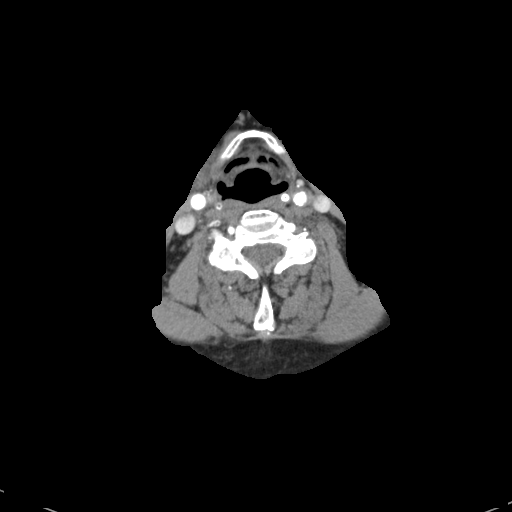
[im 71/141  lung]
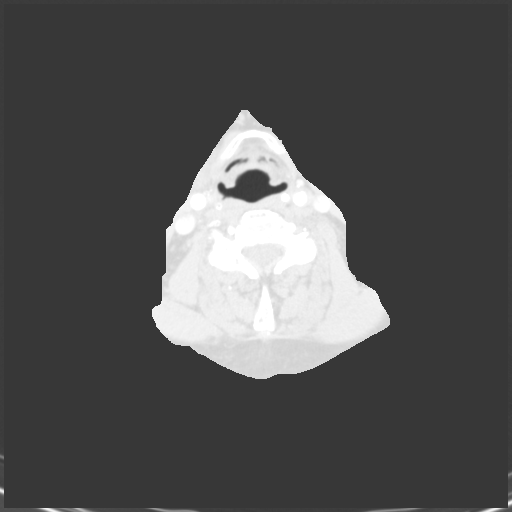
[im 88/141  soft-tissue]
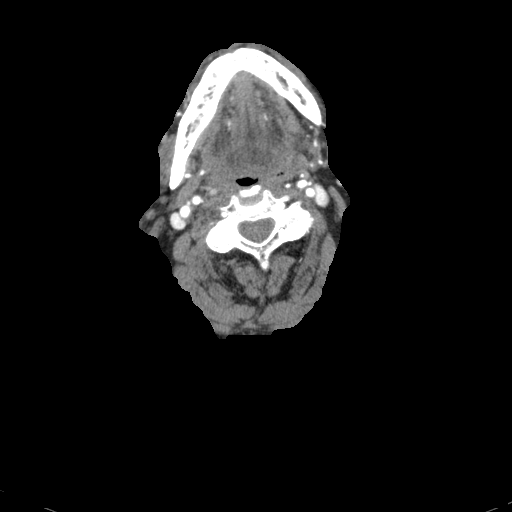
[im 88/141  lung]
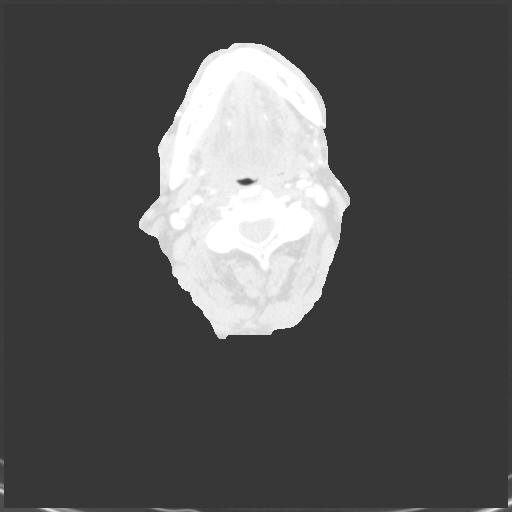
[im 106/141  soft-tissue]
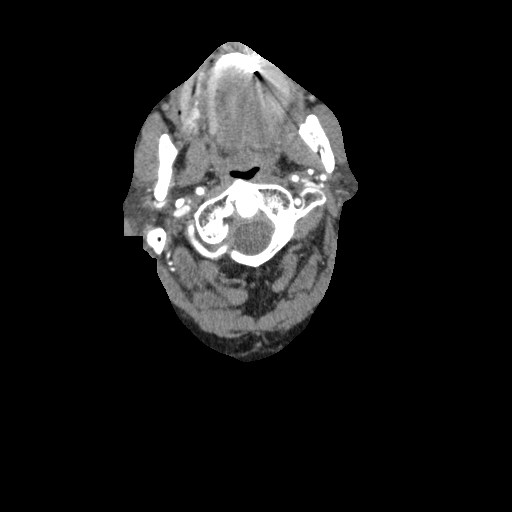
[im 106/141  lung]
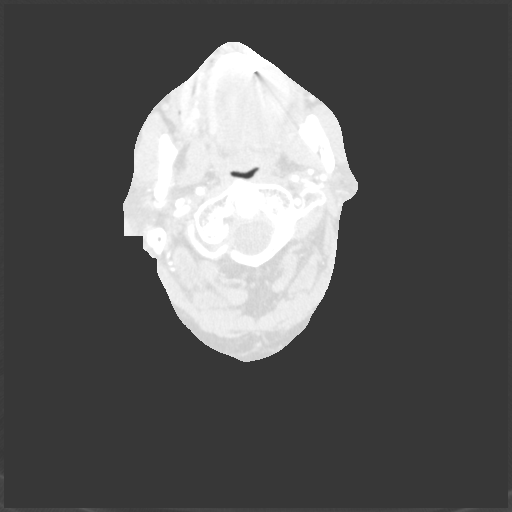
[im 123/141  soft-tissue]
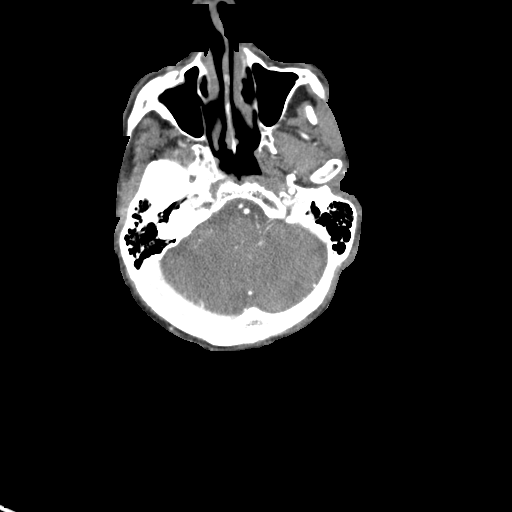
[im 123/141  lung]
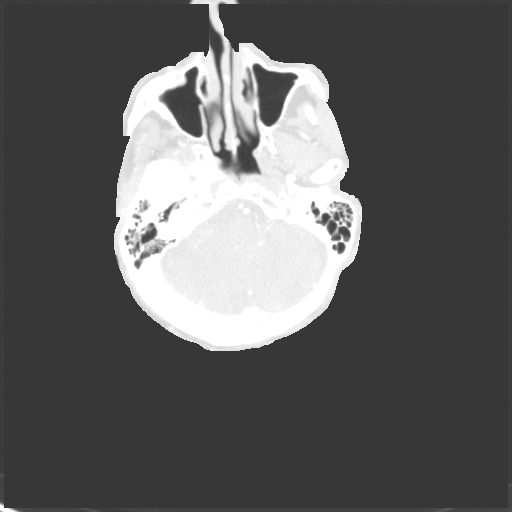

[Series 12: axial thick · axial · 0.53mm/px · z∈[-206,-121]mm · 2 of 53 slices shown]
[im 18/53  soft-tissue]
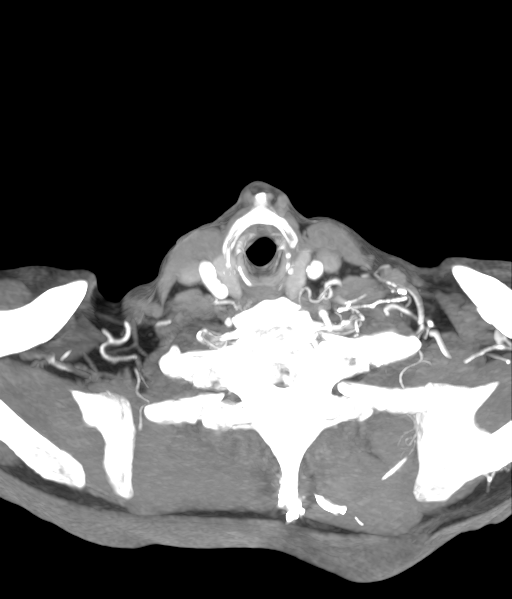
[im 35/53  soft-tissue]
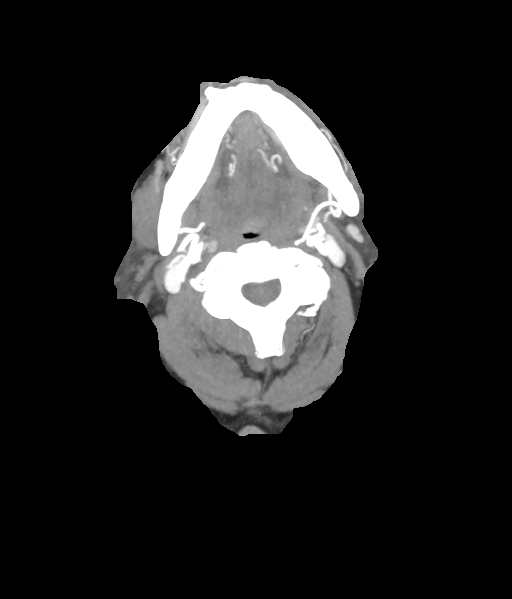

[9 of 32 positions shown; findings below may reference images not displayed]

FINDINGS: Aortic arch: Normal variant aortic arch branching pattern with
common origin of the brachiocephalic and left common carotid
arteries. Mild atherosclerotic plaque without arch vessel origin
stenosis.

Right carotid system: Patent with scattered calcified and soft
plaque in the common carotid artery and at the carotid bifurcation.
No evidence of a significant stenosis or dissection.

Left carotid system: Patent with extensive calcified and soft plaque
in the carotid bulb resulting in progressive, critical stenosis of
the proximal ICA. Poor visualization of the tiny residual patent
lumen limits quantification with stenosis estimated at greater than
85%. The more distal cervical ICA is diffusely decreased in caliber
which reflects a change from the prior study and is consistent with
reduced flow.

Vertebral arteries: Patent with the left being moderately dominant.
Calcified plaque at the vertebral origins without significant
stenosis.

Skeleton: Cervical disc degeneration most advanced at C5-6 where
disc bulging and uncovertebral spurring result in mild spinal
stenosis and severe right greater than left neural foraminal
stenosis. Severe facet arthrosis on the right at C2-3 and on the
left at C3-4.

Other neck: No evidence of cervical lymphadenopathy or mass.

Upper chest: No apical lung consolidation or mass.
IMPRESSION: 1. Progressive, critical proximal left ICA stenosis with evidence of
reduced flow distally.
2. Mild right-sided cervical carotid artery atherosclerosis without
stenosis.
3. Widely patent vertebral arteries.
4. Aortic Atherosclerosis ([HK]-[HK]).

## 2021-11-26 MED ORDER — IOPAMIDOL (ISOVUE-370) INJECTION 76%
60.0000 mL | Freq: Once | INTRAVENOUS | Status: AC | PRN
  Administered 2021-11-26: 14:00:00 60 mL via INTRAVENOUS

## 2021-12-04 ENCOUNTER — Ambulatory Visit (INDEPENDENT_AMBULATORY_CARE_PROVIDER_SITE_OTHER): Payer: Medicare Other | Admitting: Vascular Surgery

## 2021-12-04 ENCOUNTER — Encounter: Payer: Self-pay | Admitting: Vascular Surgery

## 2021-12-04 ENCOUNTER — Other Ambulatory Visit: Payer: Self-pay

## 2021-12-04 ENCOUNTER — Encounter (HOSPITAL_BASED_OUTPATIENT_CLINIC_OR_DEPARTMENT_OTHER): Payer: Self-pay

## 2021-12-04 VITALS — BP 135/84 | HR 81 | Temp 98.0°F | Resp 20 | Ht 74.0 in | Wt 215.0 lb

## 2021-12-04 DIAGNOSIS — I6523 Occlusion and stenosis of bilateral carotid arteries: Secondary | ICD-10-CM

## 2021-12-04 NOTE — H&P (View-Only) (Signed)
REASON FOR VISIT:   Greater than 80% left carotid stenosis.  MEDICAL ISSUES:   GREATER THAN 80% LEFT CAROTID STENOSIS: This patient has a greater than 80% left carotid stenosis which is asymptomatic.  I have recommended left carotid endarterectomy in order to lower his risk of future stroke.  His duplex scan and CT angiogram suggest that the carotid stenosis is surgically accessible.  He has had a previous right carotid endarterectomy which is widely patent. I have reviewed the indications for carotid endarterectomy, that is to lower the risk of future stroke. I have also reviewed the potential complications of surgery, including but not limited to: bleeding, stroke (perioperative risk 1-2%), MI, nerve injury of other unpredictable medical problems. All of the patients questions were answered and they are agreeable to proceed with surgery.   The patient does have a history of coronary artery disease.  He denies any previous history of myocardial infarction or history of congestive heart failure.  He does however admit to some dyspnea on exertion.  He has a history of atrial fibrillation and is on Eliquis.  He also has a pacemaker.  He was scheduled to see Dr. Burt Knack next month for a routine follow-up visit.  I think it would be safest to get him in to be seen by cardiology for preop clearance prior to scheduling his carotid endarterectomy.  He is 81 years old I think this would be the safest approach.  He is understandable.  If he is cleared by cardiology we will schedule him for a left carotid endarterectomy.  We will need to hold his Eliquis for 48 hours prior to the procedure.  He does know to continue taking his aspirin and statin perioperatively.  HPI:   Thomas Marshall is a pleasant 81 y.o. male who was seen by Karoline Caldwell, [place] on 11/20/2021.  The patient is following with carotid disease and a small abdominal aortic aneurysm.  The patient underwent right carotid endarterectomy in 2017 by  Dr. Adele Barthel.  He was being followed with a moderate left carotid stenosis.  This had progressed to greater than 80%.  A CT angiogram was recommended and he was set up for a visit with me for further recommendations.  Of note he is on aspirin, a statin, and also Eliquis.  He has stage III chronic kidney disease.  The patient is right-handed.  He denies any history of stroke, TIAs, expressive or receptive aphasia, or amaurosis fugax.  He does admit to some occasional dizziness.  He also admits to some general fatigue.  He denies chest pain but has had some dyspnea on exertion.   Past Medical History:  Diagnosis Date   AAA (abdominal aortic aneurysm)    Arthritis    Cancer ([place])    skin & squamous cell   Carotid artery disease ([place])    a. s/p R CEA 12/2015.   CKD (chronic kidney disease), stage III ([place])    a. per historical labs.   Coronary artery disease    a. s/p CABG 2004.   Diabetes mellitus without complication (HCC)    GERD (gastroesophageal reflux disease)    Hyperlipidemia    Hypertension    Medication intolerance Multiple    PAF (paroxysmal atrial fibrillation) (HCC)    Pneumonia    Sinus bradycardia    a. baseline HR 50s-60s, also h/o bradycardia on metoprolol and carvedilol    Family History  Problem Relation Age of Onset   Stroke Mother  Heart disease Mother    Heart disease Father    Hypertension Father    Hyperlipidemia Father    Cancer Sister        breast  and skin   Diabetes Sister    Hypertension Sister    Diabetes Brother    Heart disease Brother    Hyperlipidemia Brother    Hypertension Brother    Early death Neg Hx     SOCIAL HISTORY: Social History   Tobacco Use   Smoking status: Former    Packs/day: 1.00    Years: 13.00    Pack years: 13.00    Types: Cigarettes    Quit date: 11/30/1970    Years since quitting: 51.0   Smokeless tobacco: Never  Substance Use Topics   Alcohol use: No    Alcohol/week: 0.0 standard drinks    Allergies   Allergen Reactions   Lisinopril Swelling and Other (See Comments)    Angioedema.    Losartan Swelling and Other (See Comments)    Lips swell Angioedema    Nifedipine Swelling and Other (See Comments)    Sugar increase   Triamterene Swelling and Other (See Comments)    Face swells, no breathing impairment   Hydralazine Hcl Other (See Comments)    Fatigue; poor appetite   Losartan Potassium-Hctz Swelling and Other (See Comments)   Amoxicillin Rash and Other (See Comments)    Did it involve swelling of the face/tongue/throat, SOB, or low BP? No Did it involve sudden or severe rash/hives, skin peeling, or any reaction on the inside of your mouth or nose? No Did you need to seek medical attention at a hospital or doctor's office? No When did it last happen?      10 + years If all above answers are NO, may proceed with cephalosporin use.     Ampicillin Rash and Other (See Comments)    Did it involve swelling of the face/tongue/throat, SOB, or low BP? No Did it involve sudden or severe rash/hives, skin peeling, or any reaction on the inside of your mouth or nose? No Did you need to seek medical attention at a hospital or doctor's office? No When did it last happen?      10 + years If all above answers are "NO", may proceed with cephalosporin use.    Carvedilol Other (See Comments)    Low heart rate     Current Outpatient Medications  Medication Sig Dispense Refill   ACCU-CHEK GUIDE test strip USE 1 NEW TEST STRIP ONCE DAILY TO TEST BLOOD SUGAR 50 each 0   Accu-Chek Softclix Lancets lancets USE 1 TWICE DAILY USE AS DIRECTED E11.9 100 each 3   apixaban (ELIQUIS) 2.5 MG TABS tablet Take 1 tablet (2.5 mg total) by mouth 2 (two) times daily. (0800 & 2000) 180 tablet 3   aspirin EC 81 MG tablet Take 1 tablet (81 mg total) by mouth daily. 90 tablet 3   atorvastatin (LIPITOR) 40 MG tablet Take 40 mg by mouth at bedtime.      carvedilol (COREG) 6.25 MG tablet Take 1 tablet (6.25 mg  total) by mouth 2 (two) times daily. 180 tablet 3   cholecalciferol (VITAMIN D3) 25 MCG (1000 UNIT) tablet Take 1,000 Units by mouth daily.     glucose blood test strip Accu-Check Guide Strips. Dx E11.9 Use to test blood glucose level 1 time a day. 100 each 11   hydrALAZINE (APRESOLINE) 10 MG tablet Take 10 mg by mouth 3 (three)  times daily as needed (elevated bp greater than 160).     magnesium oxide (MAG-OX) 400 MG tablet Take 800 mg by mouth 2 (two) times daily.      meclizine (ANTIVERT) 25 MG tablet Take 1 tablet (25 mg total) by mouth 3 (three) times daily as needed for dizziness. 30 tablet 0   mupirocin ointment (BACTROBAN) 2 % Apply 1 application topically 2 (two) times daily. 30 g 2   nitroGLYCERIN (NITROSTAT) 0.4 MG SL tablet Place 1 tablet (0.4 mg total) under the tongue every 5 (five) minutes as needed for chest pain (x 3 doses). 30 tablet 3   terazosin (HYTRIN) 1 MG capsule Take 1 mg by mouth at bedtime.  30 capsule 11   vitamin C (ASCORBIC ACID) 500 MG tablet Take 500 mg by mouth daily with breakfast.      Zinc 22.5 MG TABS Take 22.5 mg by mouth every other day.     pantoprazole (PROTONIX) 40 MG tablet Take 1 tablet (40 mg total) by mouth daily. 90 tablet 3   No current facility-administered medications for this visit.    REVIEW OF SYSTEMS:  [X]  denotes positive finding, [ ]  denotes negative finding Cardiac  Comments:  Chest pain or chest pressure:    Shortness of breath upon exertion: x   Short of breath when lying flat:    Irregular heart rhythm:        Vascular    Pain in calf, thigh, or hip brought on by ambulation:    Pain in feet at night that wakes you up from your sleep:     Blood clot in your veins:    Leg swelling:         Pulmonary    Oxygen at home:    Productive cough:     Wheezing:         Neurologic    Sudden weakness in arms or legs:     Sudden numbness in arms or legs:     Sudden onset of difficulty speaking or slurred speech:    Temporary loss of  vision in one eye:     Problems with dizziness:         Gastrointestinal    Blood in stool:     Vomited blood:         Genitourinary    Burning when urinating:     Blood in urine:        Psychiatric    Major depression:         Hematologic    Bleeding problems:    Problems with blood clotting too easily:        Skin    Rashes or ulcers:        Constitutional    Fever or chills:     PHYSICAL EXAM:   Vitals:   12/04/21 0912 12/04/21 0915  BP: 138/76 135/84  Pulse: 81   Resp: 20   Temp: 98 F (36.7 C)   SpO2: 98%   Weight: 215 lb (97.5 kg)   Height: 6\' 2"  (1.88 m)     GENERAL: The patient is a well-nourished male, in no acute distress. The vital signs are documented above. CARDIAC: There is a regular rate and rhythm.  VASCULAR: He does have a left carotid bruit. Both feet are warm and well-perfused. He has no significant lower extremity swelling. PULMONARY: There is good air exchange bilaterally without wheezing or rales. ABDOMEN: Soft and non-tender with normal pitched bowel sounds.  MUSCULOSKELETAL: There are no major deformities or cyanosis. NEUROLOGIC: He has good strength in the upper extremities and lower extremities bilaterally.  He has no focal paresthesias. SKIN: There are no ulcers or rashes noted. PSYCHIATRIC: The patient has a normal affect.  DATA:    CAROTID DUPLEX: I have reviewed the carotid duplex scan that was done on 11/20/2021.  On the right side there was no evidence of recurrent carotid stenosis.  On the left side there was a greater than 80% stenosis in the proximal internal carotid artery.  The ICA was noted to be normal past the stenosis and the anatomy was felt to be normal with the bifurcation located near the hyoid notch.  CT ANGIOGRAM NECK: I have reviewed the images of the CT angiogram of the neck that was done on 11/26/2021.  This shows a critical proximal left ICA stenosis with reduced flow distally.  There was no significant  stenosis on the right.  The vertebral arteries were widely patent.  The stenosis does appear surgically accessible.  Deitra Mayo [facility] [phone]

## 2021-12-04 NOTE — Progress Notes (Signed)
REASON FOR VISIT:   Greater than 80% left carotid stenosis.  MEDICAL ISSUES:   GREATER THAN 80% LEFT CAROTID STENOSIS: This patient has a greater than 80% left carotid stenosis which is asymptomatic.  I have recommended left carotid endarterectomy in order to lower his risk of future stroke.  His duplex scan and CT angiogram suggest that the carotid stenosis is surgically accessible.  He has had a previous right carotid endarterectomy which is widely patent. I have reviewed the indications for carotid endarterectomy, that is to lower the risk of future stroke. I have also reviewed the potential complications of surgery, including but not limited to: bleeding, stroke (perioperative risk 1-2%), MI, nerve injury of other unpredictable medical problems. All of the patients questions were answered and they are agreeable to proceed with surgery.   The patient does have a history of coronary artery disease.  He denies any previous history of myocardial infarction or history of congestive heart failure.  He does however admit to some dyspnea on exertion.  He has a history of atrial fibrillation and is on Eliquis.  He also has a pacemaker.  He was scheduled to see Dr. Burt Knack next month for a routine follow-up visit.  I think it would be safest to get him in to be seen by cardiology for preop clearance prior to scheduling his carotid endarterectomy.  He is 81 years old I think this would be the safest approach.  He is understandable.  If he is cleared by cardiology we will schedule him for a left carotid endarterectomy.  We will need to hold his Eliquis for 48 hours prior to the procedure.  He does know to continue taking his aspirin and statin perioperatively.  HPI:   Thomas Marshall is a pleasant 81 y.o. male who was seen by Karoline Caldwell, [place] on 11/20/2021.  The patient is following with carotid disease and a small abdominal aortic aneurysm.  The patient underwent right carotid endarterectomy in 2017 by  Dr. Adele Barthel.  He was being followed with a moderate left carotid stenosis.  This had progressed to greater than 80%.  A CT angiogram was recommended and he was set up for a visit with me for further recommendations.  Of note he is on aspirin, a statin, and also Eliquis.  He has stage III chronic kidney disease.  The patient is right-handed.  He denies any history of stroke, TIAs, expressive or receptive aphasia, or amaurosis fugax.  He does admit to some occasional dizziness.  He also admits to some general fatigue.  He denies chest pain but has had some dyspnea on exertion.   Past Medical History:  Diagnosis Date   AAA (abdominal aortic aneurysm)    Arthritis    Cancer ([place])    skin & squamous cell   Carotid artery disease ([place])    a. s/p R CEA 12/2015.   CKD (chronic kidney disease), stage III ([place])    a. per historical labs.   Coronary artery disease    a. s/p CABG 2004.   Diabetes mellitus without complication (HCC)    GERD (gastroesophageal reflux disease)    Hyperlipidemia    Hypertension    Medication intolerance Multiple    PAF (paroxysmal atrial fibrillation) (HCC)    Pneumonia    Sinus bradycardia    a. baseline HR 50s-60s, also h/o bradycardia on metoprolol and carvedilol    Family History  Problem Relation Age of Onset   Stroke Mother  Heart disease Mother    Heart disease Father    Hypertension Father    Hyperlipidemia Father    Cancer Sister        breast  and skin   Diabetes Sister    Hypertension Sister    Diabetes Brother    Heart disease Brother    Hyperlipidemia Brother    Hypertension Brother    Early death Neg Hx     SOCIAL HISTORY: Social History   Tobacco Use   Smoking status: Former    Packs/day: 1.00    Years: 13.00    Pack years: 13.00    Types: Cigarettes    Quit date: 11/30/1970    Years since quitting: 51.0   Smokeless tobacco: Never  Substance Use Topics   Alcohol use: No    Alcohol/week: 0.0 standard drinks    Allergies   Allergen Reactions   Lisinopril Swelling and Other (See Comments)    Angioedema.    Losartan Swelling and Other (See Comments)    Lips swell Angioedema    Nifedipine Swelling and Other (See Comments)    Sugar increase   Triamterene Swelling and Other (See Comments)    Face swells, no breathing impairment   Hydralazine Hcl Other (See Comments)    Fatigue; poor appetite   Losartan Potassium-Hctz Swelling and Other (See Comments)   Amoxicillin Rash and Other (See Comments)    Did it involve swelling of the face/tongue/throat, SOB, or low BP? No Did it involve sudden or severe rash/hives, skin peeling, or any reaction on the inside of your mouth or nose? No Did you need to seek medical attention at a hospital or doctor's office? No When did it last happen?      10 + years If all above answers are NO, may proceed with cephalosporin use.     Ampicillin Rash and Other (See Comments)    Did it involve swelling of the face/tongue/throat, SOB, or low BP? No Did it involve sudden or severe rash/hives, skin peeling, or any reaction on the inside of your mouth or nose? No Did you need to seek medical attention at a hospital or doctor's office? No When did it last happen?      10 + years If all above answers are "NO", may proceed with cephalosporin use.    Carvedilol Other (See Comments)    Low heart rate     Current Outpatient Medications  Medication Sig Dispense Refill   ACCU-CHEK GUIDE test strip USE 1 NEW TEST STRIP ONCE DAILY TO TEST BLOOD SUGAR 50 each 0   Accu-Chek Softclix Lancets lancets USE 1 TWICE DAILY USE AS DIRECTED E11.9 100 each 3   apixaban (ELIQUIS) 2.5 MG TABS tablet Take 1 tablet (2.5 mg total) by mouth 2 (two) times daily. (0800 & 2000) 180 tablet 3   aspirin EC 81 MG tablet Take 1 tablet (81 mg total) by mouth daily. 90 tablet 3   atorvastatin (LIPITOR) 40 MG tablet Take 40 mg by mouth at bedtime.      carvedilol (COREG) 6.25 MG tablet Take 1 tablet (6.25 mg  total) by mouth 2 (two) times daily. 180 tablet 3   cholecalciferol (VITAMIN D3) 25 MCG (1000 UNIT) tablet Take 1,000 Units by mouth daily.     glucose blood test strip Accu-Check Guide Strips. Dx E11.9 Use to test blood glucose level 1 time a day. 100 each 11   hydrALAZINE (APRESOLINE) 10 MG tablet Take 10 mg by mouth 3 (three)  times daily as needed (elevated bp greater than 160).     magnesium oxide (MAG-OX) 400 MG tablet Take 800 mg by mouth 2 (two) times daily.      meclizine (ANTIVERT) 25 MG tablet Take 1 tablet (25 mg total) by mouth 3 (three) times daily as needed for dizziness. 30 tablet 0   mupirocin ointment (BACTROBAN) 2 % Apply 1 application topically 2 (two) times daily. 30 g 2   nitroGLYCERIN (NITROSTAT) 0.4 MG SL tablet Place 1 tablet (0.4 mg total) under the tongue every 5 (five) minutes as needed for chest pain (x 3 doses). 30 tablet 3   terazosin (HYTRIN) 1 MG capsule Take 1 mg by mouth at bedtime.  30 capsule 11   vitamin C (ASCORBIC ACID) 500 MG tablet Take 500 mg by mouth daily with breakfast.      Zinc 22.5 MG TABS Take 22.5 mg by mouth every other day.     pantoprazole (PROTONIX) 40 MG tablet Take 1 tablet (40 mg total) by mouth daily. 90 tablet 3   No current facility-administered medications for this visit.    REVIEW OF SYSTEMS:  [X]  denotes positive finding, [ ]  denotes negative finding Cardiac  Comments:  Chest pain or chest pressure:    Shortness of breath upon exertion: x   Short of breath when lying flat:    Irregular heart rhythm:        Vascular    Pain in calf, thigh, or hip brought on by ambulation:    Pain in feet at night that wakes you up from your sleep:     Blood clot in your veins:    Leg swelling:         Pulmonary    Oxygen at home:    Productive cough:     Wheezing:         Neurologic    Sudden weakness in arms or legs:     Sudden numbness in arms or legs:     Sudden onset of difficulty speaking or slurred speech:    Temporary loss of  vision in one eye:     Problems with dizziness:         Gastrointestinal    Blood in stool:     Vomited blood:         Genitourinary    Burning when urinating:     Blood in urine:        Psychiatric    Major depression:         Hematologic    Bleeding problems:    Problems with blood clotting too easily:        Skin    Rashes or ulcers:        Constitutional    Fever or chills:     PHYSICAL EXAM:   Vitals:   12/04/21 0912 12/04/21 0915  BP: 138/76 135/84  Pulse: 81   Resp: 20   Temp: 98 F (36.7 C)   SpO2: 98%   Weight: 215 lb (97.5 kg)   Height: 6\' 2"  (1.88 m)     GENERAL: The patient is a well-nourished male, in no acute distress. The vital signs are documented above. CARDIAC: There is a regular rate and rhythm.  VASCULAR: He does have a left carotid bruit. Both feet are warm and well-perfused. He has no significant lower extremity swelling. PULMONARY: There is good air exchange bilaterally without wheezing or rales. ABDOMEN: Soft and non-tender with normal pitched bowel sounds.  MUSCULOSKELETAL: There are no major deformities or cyanosis. NEUROLOGIC: He has good strength in the upper extremities and lower extremities bilaterally.  He has no focal paresthesias. SKIN: There are no ulcers or rashes noted. PSYCHIATRIC: The patient has a normal affect.  DATA:    CAROTID DUPLEX: I have reviewed the carotid duplex scan that was done on 11/20/2021.  On the right side there was no evidence of recurrent carotid stenosis.  On the left side there was a greater than 80% stenosis in the proximal internal carotid artery.  The ICA was noted to be normal past the stenosis and the anatomy was felt to be normal with the bifurcation located near the hyoid notch.  CT ANGIOGRAM NECK: I have reviewed the images of the CT angiogram of the neck that was done on 11/26/2021.  This shows a critical proximal left ICA stenosis with reduced flow distally.  There was no significant  stenosis on the right.  The vertebral arteries were widely patent.  The stenosis does appear surgically accessible.  Deitra Mayo [facility] [phone]

## 2021-12-07 NOTE — Progress Notes (Addendum)
Cardiology Office Note:    Date:  12/08/2021   ID:  Thomas Marshall, DOB 04/29/1941, MRN [id number]  PCP:  Thomas Borg, MD   [place] Providers Cardiologist:  Thomas Mocha, MD Electrophysiologist:  Thomas Peru, MD     Referring MD: Thomas Borg, MD   Chief Complaint: cardiac evaluation for upcoming surgery  History of Present Illness:    Thomas Marshall is a 81 y.o. male with a hx of CAD s/p CABG x 4 2004, symptomatic PVCs s/p ablation 2018 with marked reduction in PVCs from 25% to 7%, PPM, PAF on chronic anticoagulation, carotid artery disease s/p Rt endarterectomy, > 80% left carotid stenosis, DM, HTN, hyperlipidemia, CKD, and small AAA.   Most recent cardiac catheterization revealed patent RIMA to PDA, LIMA to LAD, SVG to diagnonal, SVG to OM, severe native CAD w/total occlusion of left main and total occlusion of RCA, no targets for intervention. Most recent echocardiogram done at [facility] on 04/23/20 revealed mild to mod concentric LVH, nl LVEF at 62%,septal motion consistent w/post-operative state, diastolic function not well evaluated due to arrhythmia, moderately dilated LA, mildly dilated RA, mild to mod aortic sclerosis without significant stenosis, mild to mod mitral regurgitation, mild pulmonary hypertension, trace TR  In Feb. 2022 he developed recurrent atrial fib despite dofetilide. He was placed on amiodarone and converted to NSR, it was held and he reverted back to a fib with RVR, Very symptomatic with fatigue in a fib.   He was last seen on 10/15/21 by Thomas Barrios, PA. In an effort to better control his HR and atrial fibrillation, his carvedilol was increased to 6.25 mg twice daily.   Today, he is here with his wife.  He reports he has been feeling at baseline since last office visit 11/22.  Heart rate at home has ranged from high 50s to 80 since increasing his carvedilol to 6.25 twice daily.  He reports he is tolerating this dose without adverse effect. Home blood  pressure readings are consistently less than 130/80. He is here for cardiac evaluation for upcoming surgery. He denies chest pain, lower extremity edema, palpitations, melena, hematuria, hemoptysis, diaphoresis, weakness, presyncope, syncope, orthopnea, and PND.  Has persistent dyspnea when in a fib and states "I not feel like doing anything."  Reports shortness of breath and fatigue have been stable for some time.  He enjoys doing yard work but is limited to doing just a little at a time before needing to rest.  He helps with housework including vacuuming. Per Dr. Scot Marshall note from 12/04/21, patient needs  cardiac clearance for left carotid endarterectomy, hold Eliquis 48 hours, continue aspirin and statin preoperatively. He denies specific concerns today.    Past Medical History:  Diagnosis Date   AAA (abdominal aortic aneurysm)    Arthritis    Cancer ([place])    skin & squamous cell   Carotid artery disease ([place])    a. s/p R CEA 12/2015.   CKD (chronic kidney disease), stage III ([place])    a. per historical labs.   Coronary artery disease    a. s/p CABG 2004.   Diabetes mellitus without complication (HCC)    GERD (gastroesophageal reflux disease)    Hyperlipidemia    Hypertension    Medication intolerance Multiple    PAF (paroxysmal atrial fibrillation) (HCC)    Pneumonia    Sinus bradycardia    a. baseline HR 50s-60s, also h/o bradycardia on metoprolol and carvedilol    Past  Surgical History:  Procedure Laterality Date   ANKLE SURGERY     right fused   BACK SURGERY     low back    CARDIOVERSION N/A 02/14/2020   Procedure: CARDIOVERSION;  Surgeon: Geralynn Rile, MD;  Location: [place];  Service: Cardiovascular;  Laterality: N/A;   CARDIOVERSION N/A 02/26/2020   Procedure: CARDIOVERSION;  Surgeon: [name], MD;  Location: [facility] ENDOSCOPY;  Service: Cardiovascular;  Laterality: N/A;   CARDIOVERSION N/A 04/12/2020   Procedure: CARDIOVERSION;  Surgeon: Skeet Latch,  MD;  Location: [facility] ENDOSCOPY;  Service: Cardiovascular;  Laterality: N/A;   CHOLECYSTECTOMY N/A 12/07/2013   Procedure: LAPAROSCOPIC CHOLECYSTECTOMY ;  Surgeon: [name], MD;  Location: [facility] OR;  Service: General;  Laterality: N/A;   COLONOSCOPY     CORONARY ARTERY BYPASS GRAFT  2004   [place] N/A 05/21/2017   Procedure: Coronary Stent Intervention;  Surgeon: Thomas Mocha, MD;  Location: [place] CV LAB;  Service: Cardiovascular;  Laterality: N/A;   ENDARTERECTOMY Right 12/10/2015   Procedure: Right Carotid ENDARTERECTOMY;  Surgeon: Conrad Havre, MD;  Location: [place];  Service: Vascular;  Laterality: Right;   EXPLORATION POST OPERATIVE OPEN HEART     FINGER SURGERY     skin graft on rt index   FINGER SURGERY Right    right index finger.   HERNIA REPAIR Right    RIH   JOINT REPLACEMENT     KNEE SURGERY     replacement on both knees   LEFT HEART CATH AND CORS/GRAFTS ANGIOGRAPHY N/A 05/21/2017   Procedure: Left Heart Cath and Cors/Grafts Angiography;  Surgeon: Thomas Mocha, MD;  Location: [place] CV LAB;  Service: Cardiovascular;  Laterality: N/A;   LEFT HEART CATH AND CORS/GRAFTS ANGIOGRAPHY N/A 05/20/2020   Procedure: LEFT HEART CATH AND CORS/GRAFTS ANGIOGRAPHY;  Surgeon: Thomas Mocha, MD;  Location: [place] CV LAB;  Service: Cardiovascular;  Laterality: N/A;   PACEMAKER IMPLANT N/A 03/14/2020   Procedure: PACEMAKER IMPLANT;  Surgeon: Evans Lance, MD;  Location: [place] CV LAB;  Service: Cardiovascular;  Laterality: N/A;   PROSTATE SURGERY     PVC ABLATION N/A 10/27/2017   Procedure: PVC ABLATION;  Surgeon: Evans Lance, MD;  Location: [place] CV LAB;  Service: Cardiovascular;  Laterality: N/A;   TONSILLECTOMY     URINARY SURGERY     scar tissue    Current Medications: Current Meds  Medication Sig   ACCU-CHEK GUIDE test strip USE 1 NEW TEST STRIP ONCE DAILY TO TEST BLOOD SUGAR   Accu-Chek Softclix Lancets lancets  USE 1 TWICE DAILY USE AS DIRECTED E11.9   apixaban (ELIQUIS) 2.5 MG TABS tablet Take 1 tablet (2.5 mg total) by mouth 2 (two) times daily. (0800 & 2000)   aspirin EC 81 MG tablet Take 1 tablet (81 mg total) by mouth daily.   atorvastatin (LIPITOR) 40 MG tablet Take 40 mg by mouth at bedtime.    carvedilol (COREG) 6.25 MG tablet Take 1 tablet (6.25 mg total) by mouth 2 (two) times daily.   cholecalciferol (VITAMIN D3) 25 MCG (1000 UNIT) tablet Take 1,000 Units by mouth daily.   glucose blood test strip Accu-Check Guide Strips. Dx E11.9 Use to test blood glucose level 1 time a day.   hydrALAZINE (APRESOLINE) 10 MG tablet Take 10 mg by mouth 3 (three) times daily as needed (elevated bp greater than 160).   magnesium oxide (MAG-OX) 400 MG tablet Take 800 mg by  mouth 2 (two) times daily.    meclizine (ANTIVERT) 25 MG tablet Take 1 tablet (25 mg total) by mouth 3 (three) times daily as needed for dizziness.   mupirocin ointment (BACTROBAN) 2 % Apply 1 application topically 2 (two) times daily.   nitroGLYCERIN (NITROSTAT) 0.4 MG SL tablet Place 1 tablet (0.4 mg total) under the tongue every 5 (five) minutes as needed for chest pain (x 3 doses).   terazosin (HYTRIN) 1 MG capsule Take 1 mg by mouth at bedtime.    vitamin C (ASCORBIC ACID) 500 MG tablet Take 500 mg by mouth daily with breakfast.    Zinc 22.5 MG TABS Take 22.5 mg by mouth every other day.     Allergies:   Lisinopril, Losartan, Nifedipine, Triamterene, Hydralazine hcl, Losartan potassium-hctz, Amoxicillin, Ampicillin, and Carvedilol   Social History   Socioeconomic History   Marital status: Married    Spouse name: Not on file   Number of children: 2   Years of education: Not on file   Highest education level: Not on file  Occupational History   Occupation: retired  Tobacco Use   Smoking status: Former    Packs/day: 1.00    Years: 13.00    Pack years: 13.00    Types: Cigarettes    Quit date: 11/30/1970    Years since quitting:  51.0   Smokeless tobacco: Never  Vaping Use   Vaping Use: Never used  Substance and Sexual Activity   Alcohol use: No    Alcohol/week: 0.0 standard drinks   Drug use: No   Sexual activity: Yes  Other Topics Concern   Not on file  Social History Narrative   Not on file   Social Determinants of Health   Financial Resource Strain: Not on file  Food Insecurity: Not on file  Transportation Needs: Not on file  Physical Activity: Not on file  Stress: Not on file  Social Connections: Not on file     Family History: The patient's family history includes Cancer in his sister; Diabetes in his brother and sister; Heart disease in his brother, father, and mother; Hyperlipidemia in his brother and father; Hypertension in his brother, father, and sister; Stroke in his mother. There is no history of Early death.  ROS:   Please see the history of present illness.    + dyspnea on exertion All other systems reviewed and are negative.  Labs/Other Studies Reviewed:    The following studies were reviewed today:  Carotid duplex 11/20/21  Right Carotid: Velocities in the right ICA are consistent with a 1-39%  stenosis.  Left Carotid: Velocities in the left ICA are consistent with a 80-99%  stenosis.  Vertebrals: Bilateral vertebral arteries demonstrate antegrade flow.  Subclavians: Normal flow hemodynamics were seen in bilateral subclavian               arteries.   AAA Duplex 5/22  Abdominal Aorta Findings:  +-----------+-------+----------+----------+--------+--------+--------+   Location   AP (cm) Trans (cm) PSV (cm/s) Waveform Thrombus Comments   +-----------+-------+----------+----------+--------+--------+--------+   Proximal   2.23    2.46       45                                      +-----------+-------+----------+----------+--------+--------+--------+   Mid        3.44    3.54       37                                       +-----------+-------+----------+----------+--------+--------+--------+  Distal     2.28    2.53       39                                      +-----------+-------+----------+----------+--------+--------+--------+   RT CIA Prox 1.5     1.5        65                                      +-----------+-------+----------+----------+--------+--------+--------+   LT CIA Prox 1.4     1.3        64              Summary:  Abdominal Aorta: There is evidence of abnormal dilatation of the mid  Abdominal aorta. The largest aortic diameter has increased compared to  prior exam. Previous diameter measurement was 3.0 x 3.2 cm obtained on  02/02/18.   LHC   1.  Severe native vessel coronary artery disease with total occlusion of the left main and total occlusion of the RCA 2.  Status post aortocoronary bypass surgery with continued patency of the RIMA to PDA, LIMA to LAD, saphenous vein graft to diagonal, and saphenous vein graft OM   Recommendations: Ongoing medical therapy.  No targets for intervention.  Note there is a moderately severe stenosis at the distal anastomosis of the saphenous vein graft OM, likely a mechanical issue that has been present since the time of surgery.  This is unchanged over time and there is brisk TIMI-3 flow, very unlikely to be related to the patient's symptoms.   Recent Labs: 10/15/2021: ALT 19; BUN 29; Creatinine, Ser 1.72; Hemoglobin 13.0; Platelets 103.0; Potassium 4.7; Sodium 139; TSH 1.59  Recent Lipid Panel    Component Value Date/Time   CHOL 111 10/15/2021 1357   TRIG 97.0 10/15/2021 1357   HDL 41.40 10/15/2021 1357   CHOLHDL 3 10/15/2021 1357   VLDL 19.4 10/15/2021 1357   LDLCALC 51 10/15/2021 1357   LDLDIRECT 67.0 09/16/2016 1455     Risk Assessment/Calculations:    CHA2DS2-VASc Score = 4  This indicates a 4.8% annual risk of stroke. The patient's score is based upon: CHF History: 0 HTN History: 1 Diabetes History: 0 Stroke History: 0 Vascular  Disease History: 1 Age Score: 2 Gender Score: 0    Physical Exam:    VS:  BP 120/82    Pulse 80    Ht 6\' 2"  (1.88 m)    Wt 214 lb 4.8 oz (97.2 kg)    SpO2 98%    BMI 27.51 kg/m     Wt Readings from Last 3 Encounters:  12/08/21 214 lb 4.8 oz (97.2 kg)  12/04/21 215 lb (97.5 kg)  11/20/21 213 lb 12.8 oz (97 kg)     GEN:  Well nourished, well developed in no acute distress HEENT: Normal NECK: No JVD; No carotid bruits LYMPHATICS: No lymphadenopathy CARDIAC: Irregular RR, no murmurs, rubs, gallops RESPIRATORY:  Clear to auscultation without rales, wheezing or rhonchi  ABDOMEN: Soft, non-tender, non-distended MUSCULOSKELETAL:  No edema; No deformity  SKIN: Warm and dry NEUROLOGIC:  Alert and oriented x 3 PSYCHIATRIC:  Normal affect   EKG:  EKG is ordered today.  The ekg ordered today demonstrates atrial fibrillation with VR of 80 bpm, RBBB, LAFB, no acute change from previous  Diagnoses:    1. Hyperlipidemia LDL goal <70   2. Preoperative cardiovascular examination   3. Coronary artery disease involving native coronary artery of native heart without angina pectoris   4. Dyspnea, unspecified type   5. Stenosis of left carotid artery   6. Abdominal aortic aneurysm (AAA) without rupture, unspecified part   7. Hx of CABG   8. Paroxysmal atrial fibrillation (HCC)    Assessment and Plan:     Preoperative cardiovascular examination: He reports he is going to be scheduled for left endarterectomy with Dr. Scot Marshall. According to the Revised Cardiac Risk Index (RCRI), his Perioperative Risk of Major Cardiac Event is (%): 6.6. His Functional Capacity in METs is: 5.62 according to the Duke Activity Status Index (DASI). Due to time since last echo in 2018, will repeat echocardiogram to rule out underlying valve or heart failure that may be contributing to dyspnea.  Would favor that he continue aspirin through the perioperative period.  We will update basic metabolic panel today for guidance  on dosing of Eliquis.  Request is for patient to hold Eliquis for 48 hours prior to surgery.Will update anticoagulation therapy guidelines once lab work is complete.  Anticipate providing clearance if echo unremarkable.  CAD without angina/s/p CABG 2004: He denies chest pain.  Feels that dyspnea is stable for many months.  In the setting of upcoming vascular surgery, we will get an echocardiogram and we will pursue further evaluation if echo reveals results concerning for worsening ischemia.  Continue aspirin, atorvastatin, hydralazine, carvedilol.  Carotid artery stenosi s/p right endarterectomy 2017: He has regular follow-up with a [facility] and has recent imaging that reveals 80-99% stenosis in left carotid.  Plan per patient for upcoming left endarterectomy with Dr. Scot Marshall.  Preoperative cardiovascular examination as noted above.  Plan per [facility].  Continue aspirin, statin.  Essential hypertension: Blood pressures well controlled today.  Patient reports similar readings at home.  Continue hydralazine, terazosin, carvedilol.  CKD Stage III: Creatinine 1.72, GFR 36 on 10/15/2021.  We will recheck basic metabolic panel today.  Baseline creatinine appears to be 1.7-2.1.  [facility].  A fib on chronic anticoagulation: He is in A. fib today with well-controlled ventricular rate.  He reports similar pulse readings at home.  He continues to have fatigue and dyspnea that is chronic and that he associates with being in atrial fibrillation.  He denies bleeding concerns.  Continue carvedilol at current dose.  We will get CBC and basic metabolic panel today.  Continue Eliquis.   Hyperlipidemia LDL goal < 70: LDL 51 on 10/15/21.  Continue atorvastatin.  AAA: Vascular duplex completed on 04/23/2021  revealing evidence of abnormal dilatation of the mid abdominal aorta slightly increased from previous study in 2019. Continue good BP control. Plan per [facility].   Disposition: Echo for preoperative evaluation, 1 month  with EP, 7 months with Dr. Burt Knack  Addendum: Patient had an echo on 12/10/21 which revealed normal LVEF at 55-60%, no regional wall abnormalities, normal RV, mild MR, mild AS, and severe dilatation of the ascending aorta. Patient had non-contrast chest CT on 12/11/21 for evaluation of the aorta which revealed no thoracic aorta aneurysm. Patient was updated of these results and note sent to Dr. Scot Marshall for preop clearance.    Medication Adjustments/Labs and Tests Ordered: Current medicines are reviewed at length with the patient today.  Concerns regarding medicines are outlined above.  Orders Placed This Encounter  Procedures   Basic metabolic panel   CBC  EKG 12-Lead   ECHOCARDIOGRAM COMPLETE   No orders of the defined types were placed in this encounter.   Patient Instructions  Medication Instructions:  Your Physician recommend you continue on your current medication as directed.     Hold Eliquis 48 hours prior to surgery  Continue Aspirin   *If you need a refill on your cardiac medications before your next appointment, please call your pharmacy*   Lab Work: Your physician recommends that you return for lab work Today- CBC, BMET   If you have labs (blood work) drawn today and your tests are completely normal, you will receive your results only by: [place] (if you have [place]) OR A paper copy in the mail If you have any lab test that is abnormal or we need to change your treatment, we will call you to review the results.   Testing/Procedures: Your physician has requested that you have an echocardiogram. Echocardiography is a painless test that uses sound waves to create images of your heart. It provides your doctor with information about the size and shape of your heart and how well your hearts chambers and valves are working. This procedure takes approximately one hour. There are no restrictions for this procedure. [place], you and your health needs are our priority.  As part of our continuing mission to provide you with exceptional heart care, we have created designated Provider Care Teams.  These Care Teams include your primary Cardiologist (physician) and Advanced Practice Providers (APPs -  Physician Assistants and Nurse Practitioners) who all work together to provide you with the care you need, when you need it.  We recommend signing up for the patient portal called "MyChart".  Sign up information is provided on this After Visit Summary.  MyChart is used to connect with patients for Virtual Visits (Telemedicine).  Patients are able to view lab/test results, encounter notes, upcoming appointments, etc.  Non-urgent messages can be sent to your provider as well.   To learn more about what you can do with MyChart, go to [email or web address].    Your next appointment:   Follow up as scheduled with Electrophysiology and with Dr. Burt Knack in August   The format for your next appointment:   In Person  Provider:   [name], MD     Other Instructions None today    Signed, Emmaline Life, NP  12/08/2021 9:10 AM    [place]

## 2021-12-08 ENCOUNTER — Other Ambulatory Visit: Payer: Self-pay

## 2021-12-08 ENCOUNTER — Ambulatory Visit (INDEPENDENT_AMBULATORY_CARE_PROVIDER_SITE_OTHER): Payer: Medicare Other | Admitting: Nurse Practitioner

## 2021-12-08 ENCOUNTER — Encounter (HOSPITAL_BASED_OUTPATIENT_CLINIC_OR_DEPARTMENT_OTHER): Payer: Self-pay | Admitting: Nurse Practitioner

## 2021-12-08 VITALS — BP 120/82 | HR 80 | Ht 74.0 in | Wt 214.3 lb

## 2021-12-08 DIAGNOSIS — Z01810 Encounter for preprocedural cardiovascular examination: Secondary | ICD-10-CM

## 2021-12-08 DIAGNOSIS — Z951 Presence of aortocoronary bypass graft: Secondary | ICD-10-CM | POA: Diagnosis not present

## 2021-12-08 DIAGNOSIS — I2510 Atherosclerotic heart disease of native coronary artery without angina pectoris: Secondary | ICD-10-CM | POA: Diagnosis not present

## 2021-12-08 DIAGNOSIS — E785 Hyperlipidemia, unspecified: Secondary | ICD-10-CM | POA: Diagnosis not present

## 2021-12-08 DIAGNOSIS — R0600 Dyspnea, unspecified: Secondary | ICD-10-CM

## 2021-12-08 DIAGNOSIS — I480 Paroxysmal atrial fibrillation: Secondary | ICD-10-CM

## 2021-12-08 DIAGNOSIS — I714 Abdominal aortic aneurysm, without rupture, unspecified: Secondary | ICD-10-CM

## 2021-12-08 DIAGNOSIS — E875 Hyperkalemia: Secondary | ICD-10-CM

## 2021-12-08 DIAGNOSIS — I6522 Occlusion and stenosis of left carotid artery: Secondary | ICD-10-CM | POA: Diagnosis not present

## 2021-12-08 LAB — CBC
Hematocrit: 43.5 % (ref 37.5–51.0)
Hemoglobin: 14.8 g/dL (ref 13.0–17.7)
MCH: 32.9 pg (ref 26.6–33.0)
MCHC: 34 g/dL (ref 31.5–35.7)
MCV: 97 fL (ref 79–97)
Platelets: 118 10*3/uL — ABNORMAL LOW (ref 150–450)
RBC: 4.5 x10E6/uL (ref 4.14–5.80)
RDW: 12.3 % (ref 11.6–15.4)
WBC: 6.3 10*3/uL (ref 3.4–10.8)

## 2021-12-08 LAB — BASIC METABOLIC PANEL
BUN/Creatinine Ratio: 18 (ref 10–24)
BUN: 34 mg/dL — ABNORMAL HIGH (ref 8–27)
CO2: 25 mmol/L (ref 20–29)
Calcium: 9.6 mg/dL (ref 8.6–10.2)
Chloride: 102 mmol/L (ref 96–106)
Creatinine, Ser: 1.89 mg/dL — ABNORMAL HIGH (ref 0.76–1.27)
Glucose: 145 mg/dL — ABNORMAL HIGH (ref 70–99)
Potassium: 5.3 mmol/L — ABNORMAL HIGH (ref 3.5–5.2)
Sodium: 140 mmol/L (ref 134–144)
eGFR: 35 mL/min/{1.73_m2} — ABNORMAL LOW (ref >59)

## 2021-12-08 NOTE — Patient Instructions (Signed)
Medication Instructions:  Your Physician recommend you continue on your current medication as directed.     Hold Eliquis 48 hours prior to surgery  Continue Aspirin   *If you need a refill on your cardiac medications before your next appointment, please call your pharmacy*   Lab Work: Your physician recommends that you return for lab work Today- CBC, BMET   If you have labs (blood work) drawn today and your tests are completely normal, you will receive your results only by: [place] (if you have [place]) OR A paper copy in the mail If you have any lab test that is abnormal or we need to change your treatment, we will call you to review the results.   Testing/Procedures: Your physician has requested that you have an echocardiogram. Echocardiography is a painless test that uses sound waves to create images of your heart. It provides your doctor with information about the size and shape of your heart and how well your hearts chambers and valves are working. This procedure takes approximately one hour. There are no restrictions for this procedure. [place], you and your health needs are our priority.  As part of our continuing mission to provide you with exceptional heart care, we have created designated Provider Care Teams.  These Care Teams include your primary Cardiologist (physician) and Advanced Practice Providers (APPs -  Physician Assistants and Nurse Practitioners) who all work together to provide you with the care you need, when you need it.  We recommend signing up for the patient portal called "MyChart".  Sign up information is provided on this After Visit Summary.  MyChart is used to connect with patients for Virtual Visits (Telemedicine).  Patients are able to view lab/test results, encounter notes, upcoming appointments, etc.  Non-urgent messages can be sent to your provider as well.   To learn more about what  you can do with MyChart, go to [email or web address].    Your next appointment:   Follow up as scheduled with Electrophysiology and with Dr. Burt Knack in August   The format for your next appointment:   In Person  Provider:   [name], MD     Other Instructions None today

## 2021-12-09 ENCOUNTER — Encounter (HOSPITAL_BASED_OUTPATIENT_CLINIC_OR_DEPARTMENT_OTHER): Payer: Self-pay

## 2021-12-09 NOTE — Addendum Note (Signed)
Addended by: Gerald Stabs on: 12/09/2021 08:39 AM   Modules accepted: Orders

## 2021-12-09 NOTE — Progress Notes (Addendum)
Results sent to patient via mychart message, labs ordered and slips placed in the mail

## 2021-12-10 ENCOUNTER — Other Ambulatory Visit: Payer: Self-pay

## 2021-12-10 ENCOUNTER — Ambulatory Visit (INDEPENDENT_AMBULATORY_CARE_PROVIDER_SITE_OTHER): Payer: Medicare Other

## 2021-12-10 DIAGNOSIS — R0600 Dyspnea, unspecified: Secondary | ICD-10-CM

## 2021-12-10 DIAGNOSIS — I714 Abdominal aortic aneurysm, without rupture, unspecified: Secondary | ICD-10-CM | POA: Diagnosis not present

## 2021-12-10 DIAGNOSIS — I6522 Occlusion and stenosis of left carotid artery: Secondary | ICD-10-CM | POA: Diagnosis not present

## 2021-12-10 LAB — ECHOCARDIOGRAM COMPLETE
AR max vel: 2.11 cm2
AV Area VTI: 1.77 cm2
AV Area mean vel: 1.82 cm2
AV Mean grad: 5 mmHg
AV Peak grad: 11.2 mmHg
Ao pk vel: 1.67 m/s
Area-P 1/2: 6.9 cm2
Single Plane A4C EF: 48.9 %

## 2021-12-11 ENCOUNTER — Other Ambulatory Visit: Payer: Self-pay | Admitting: Nurse Practitioner

## 2021-12-11 ENCOUNTER — Ambulatory Visit
Admission: RE | Admit: 2021-12-11 | Discharge: 2021-12-11 | Disposition: A | Discharge status: Home / Self Care | Payer: Medicare Other | Source: Ambulatory Visit | Attending: Nurse Practitioner | Admitting: Nurse Practitioner

## 2021-12-11 ENCOUNTER — Telehealth: Payer: Self-pay | Admitting: Nurse Practitioner

## 2021-12-11 DIAGNOSIS — J984 Other disorders of lung: Secondary | ICD-10-CM | POA: Diagnosis not present

## 2021-12-11 DIAGNOSIS — Z951 Presence of aortocoronary bypass graft: Secondary | ICD-10-CM | POA: Diagnosis not present

## 2021-12-11 DIAGNOSIS — N184 Chronic kidney disease, stage 4 (severe): Secondary | ICD-10-CM

## 2021-12-11 DIAGNOSIS — I7121 Aneurysm of the ascending aorta, without rupture: Secondary | ICD-10-CM

## 2021-12-11 IMAGING — CT CT CHEST W/O CM
1 series · 15 of 34 positions shown, 19 images · non-contrast
Comparison: None.

CLINICAL DATA: Aortic aneurysm, known or suspected



[Series 2: chest w/(date) · axial · 0.78mm/px · z∈[-293,+3]mm · 15 of 174 slices shown, 19 images]
[im 13/174  mediastinal]
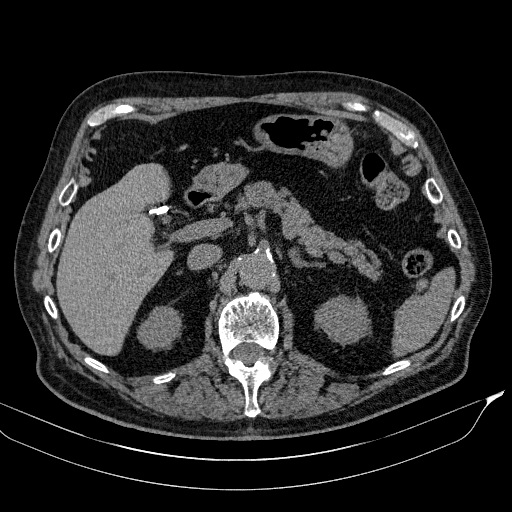
[im 13/174  lung]
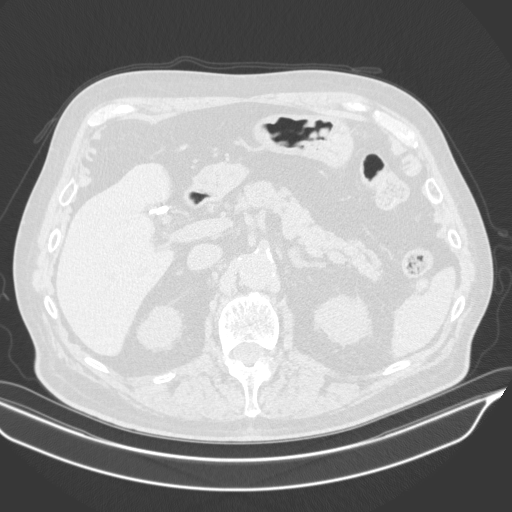
[im 26/174  lung]
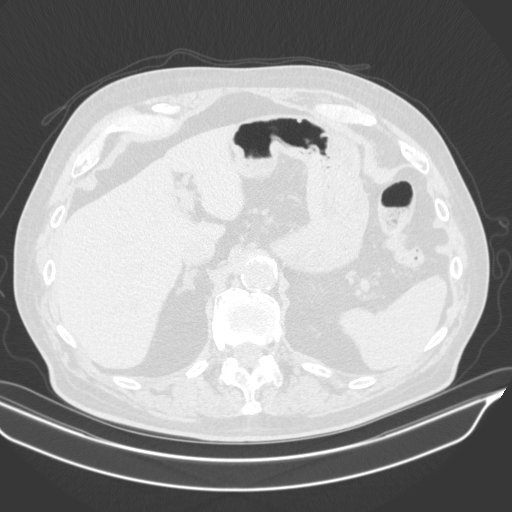
[im 35/174  lung]
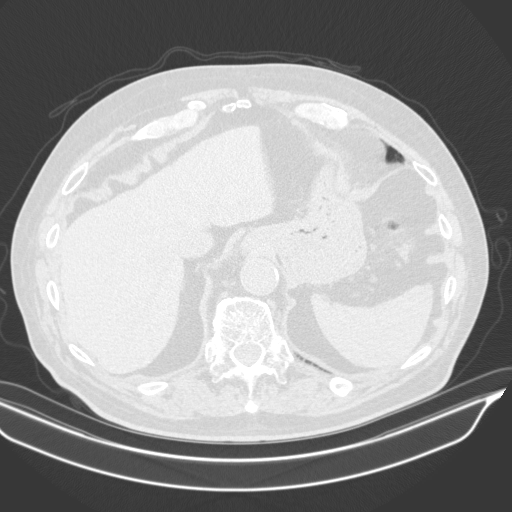
[im 45/174  lung]
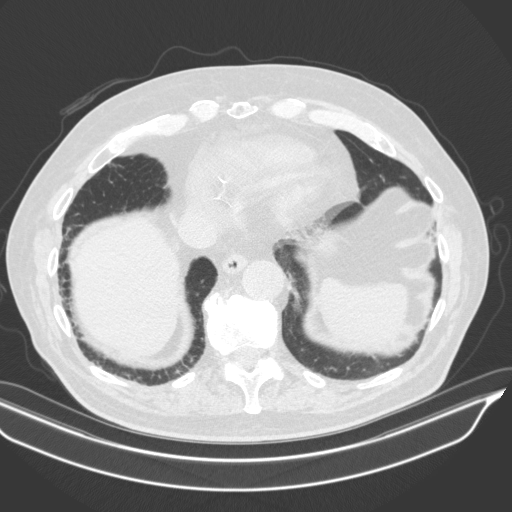
[im 58/174  mediastinal]
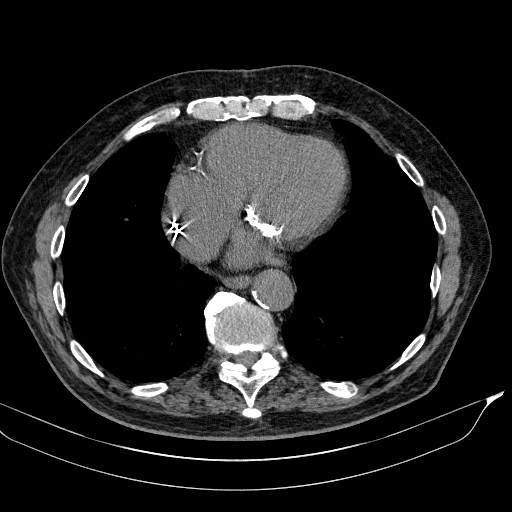
[im 58/174  lung]
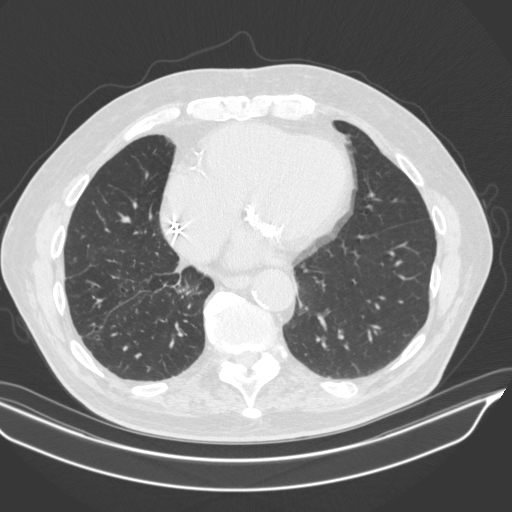
[im 70/174  lung]
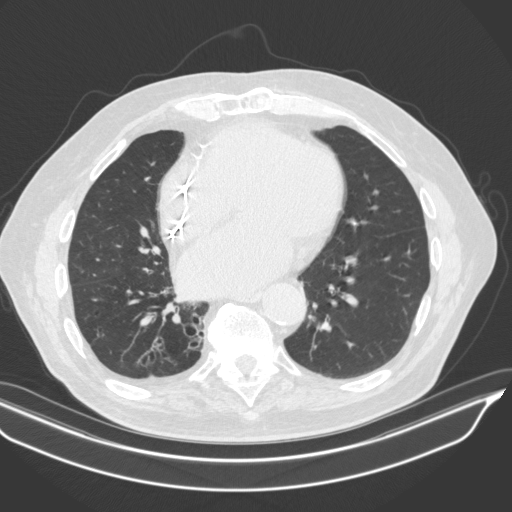
[im 77/174  lung]
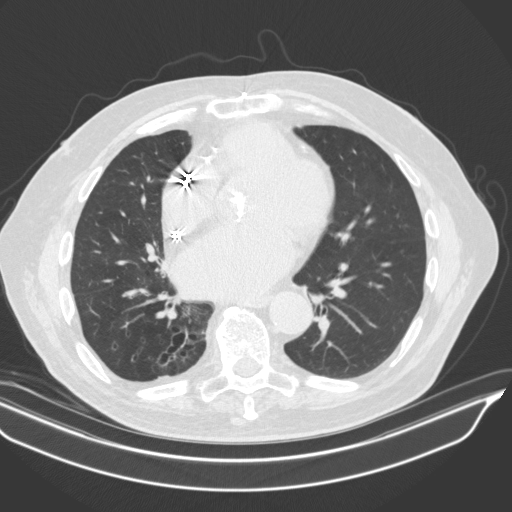
[im 90/174  lung]
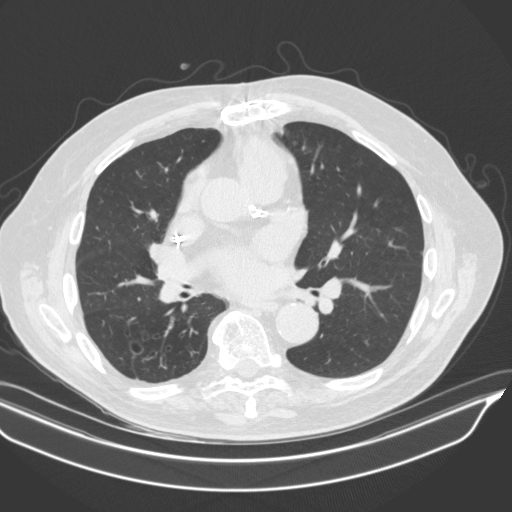
[im 97/174  mediastinal]
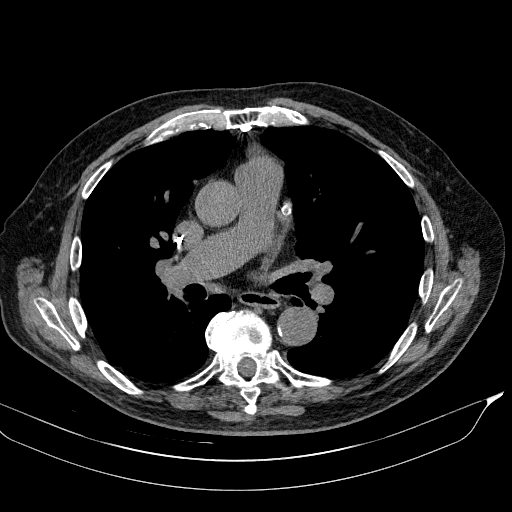
[im 97/174  lung]
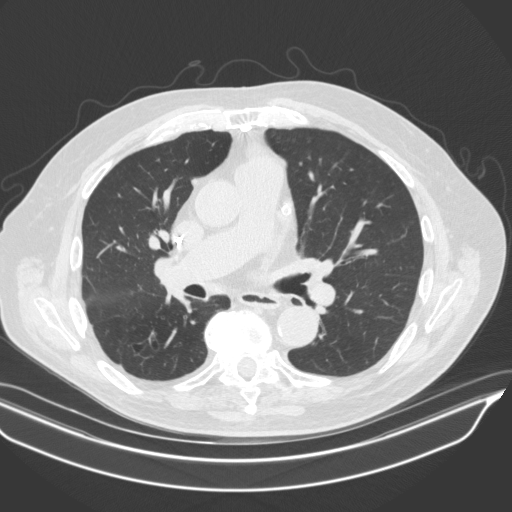
[im 104/174  lung]
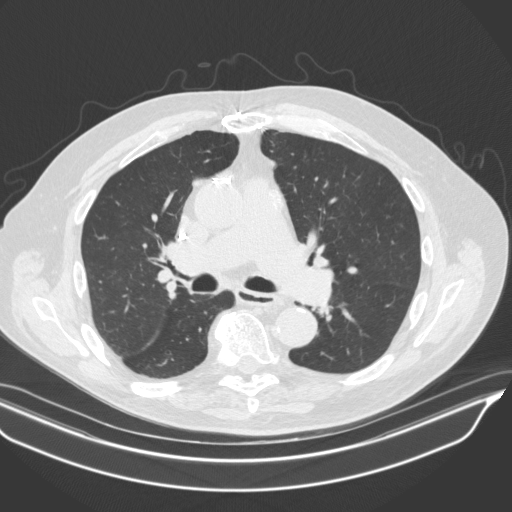
[im 116/174  lung]
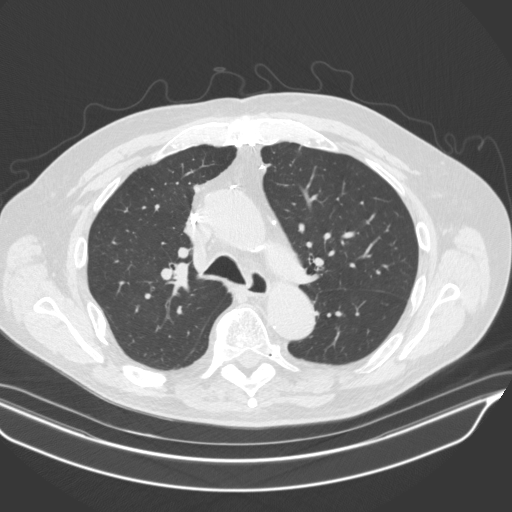
[im 129/174  lung]
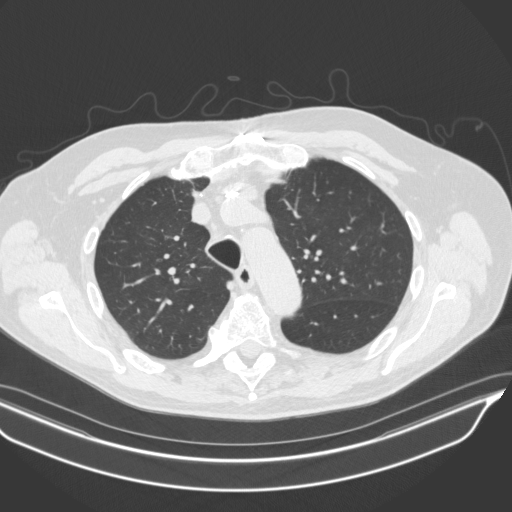
[im 139/174  mediastinal]
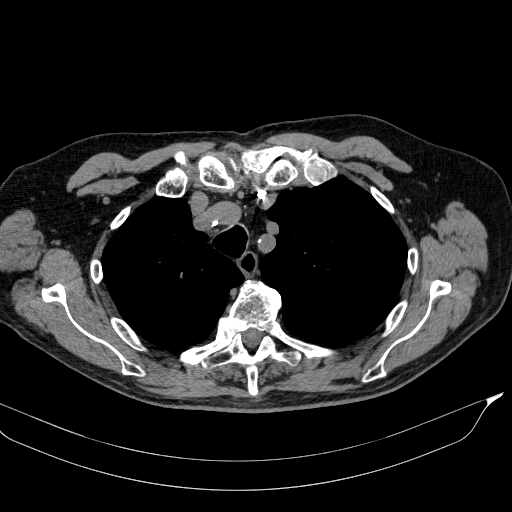
[im 139/174  lung]
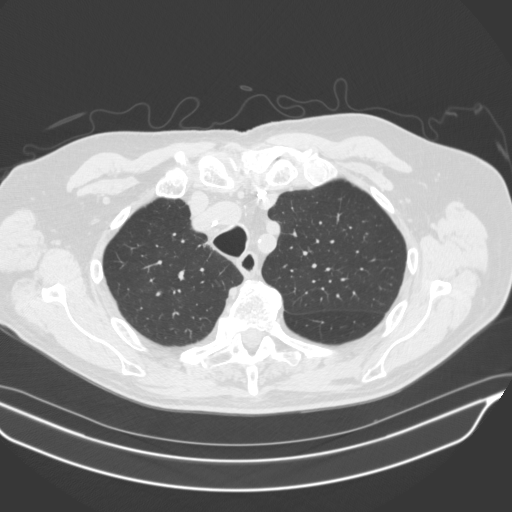
[im 148/174  lung]
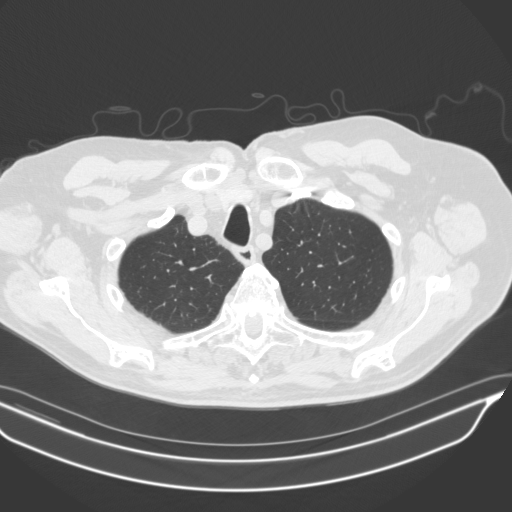
[im 161/174  lung]
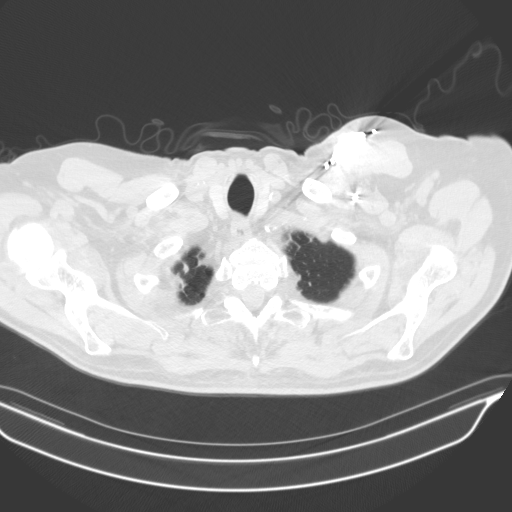

[15 of 34 positions shown; findings below may reference images not displayed]

FINDINGS: Cardiovascular: There is a 2 lead implantable cardiac device with
leads terminating in the right atrium and right ventricle. The heart
measures at the upper limit of normal. Status post coronary artery
bypass grafting. No pericardial effusion. The thoracic aorta is
normal in caliber.

Mediastinum/Nodes: No enlarged mediastinal, hilar, or axillary lymph
nodes. The thyroid gland appears normal.

Lungs/Pleura: No pleural effusion. No pneumothorax. No mass or focal
consolidation. Scattered pneumatoceles within the right lower lobe,
presumably sequela of prior trauma or infection. No suspicious
pulmonary nodules.

Musculoskeletal: No aggressive osseous lesions. Status post median
sternotomy.

Upper abdomen: The visualized upper abdomen is unremarkable.
IMPRESSION: The thoracic aorta is normal in caliber without aneurysm.

No acute findings in the chest.

## 2021-12-11 NOTE — Telephone Encounter (Signed)
Reviewed echo results with patient and advised that due to the increase in the ascending aorta to 44 mm from previous measurement done by echo (39 mm) on 04/23/20 I have discussed with Dr. Burt Knack and we would like to get a CT without contrast for better measurement. I advised that someone from our office will call the patient to schedule. He verbalized understanding and agreement and thanked me for the call.   Emmaline Life, NP-C    12/11/2021, 11:00 AM [name] [address] Office [phone] Fax [phone]

## 2021-12-15 ENCOUNTER — Ambulatory Visit (INDEPENDENT_AMBULATORY_CARE_PROVIDER_SITE_OTHER): Payer: Medicare Other

## 2021-12-15 ENCOUNTER — Telehealth: Payer: Self-pay | Admitting: Nurse Practitioner

## 2021-12-15 DIAGNOSIS — E875 Hyperkalemia: Secondary | ICD-10-CM | POA: Diagnosis not present

## 2021-12-15 DIAGNOSIS — I495 Sick sinus syndrome: Secondary | ICD-10-CM | POA: Diagnosis not present

## 2021-12-15 LAB — BASIC METABOLIC PANEL
BUN/Creatinine Ratio: 15 (ref 10–24)
BUN: 27 mg/dL (ref 8–27)
CO2: 26 mmol/L (ref 20–29)
Calcium: 9.1 mg/dL (ref 8.6–10.2)
Chloride: 106 mmol/L (ref 96–106)
Creatinine, Ser: 1.75 mg/dL — ABNORMAL HIGH (ref 0.76–1.27)
Glucose: 142 mg/dL — ABNORMAL HIGH (ref 70–99)
Potassium: 5.5 mmol/L — ABNORMAL HIGH (ref 3.5–5.2)
Sodium: 144 mmol/L (ref 134–144)
eGFR: 38 mL/min/{1.73_m2} — ABNORMAL LOW (ref >59)

## 2021-12-15 LAB — CUP PACEART REMOTE DEVICE CHECK
Battery Remaining Longevity: 126 mo
Battery Remaining Percentage: 89 %
Battery Voltage: 3.02 V
Brady Statistic RV Percent Paced: 1 %
Date Time Interrogation Session: 20230116020011
Implantable Lead Implant Date: 20210415
Implantable Lead Implant Date: 20210415
Implantable Lead Location: 753859
Implantable Lead Location: 753860
Implantable Pulse Generator Implant Date: 20210415
Lead Channel Impedance Value: 560 Ohm
Lead Channel Pacing Threshold Amplitude: 0.5 V
Lead Channel Pacing Threshold Pulse Width: 0.5 ms
Lead Channel Sensing Intrinsic Amplitude: 12 mV
Lead Channel Setting Pacing Amplitude: 2.5 V
Lead Channel Setting Pacing Pulse Width: 0.5 ms
Lead Channel Setting Sensing Sensitivity: 2 mV
Pulse Gen Model: 2272
Pulse Gen Serial Number: 3813712

## 2021-12-15 NOTE — Telephone Encounter (Signed)
New Message:     Patient said he just had lab work on 12-08-21. He received a letter in the mail on Saturday(12-13-21)that he need more lab work. He wants to know if this is correct?

## 2021-12-15 NOTE — Telephone Encounter (Signed)
Clearance was faxed ot [facility] on 12/11/21.

## 2021-12-15 NOTE — Telephone Encounter (Signed)
Returned patient call about need for repeat labs. Patient verbalizes understanding.   Asked if clearance has been sent in for surgery will route to Christen Bame for advisement

## 2021-12-15 NOTE — Telephone Encounter (Signed)
Patient's wife is calling stating she called the requesting office for the patient's clearance today and they advised them they have not received anything from our office. She is requesting it be re-faxed to [phone]. She would like to be called back and notified that this was done and is requesting it be done today. She is also requesting the office it is being faxed to be called to confirm they received it.

## 2021-12-16 ENCOUNTER — Other Ambulatory Visit: Payer: Self-pay

## 2021-12-16 ENCOUNTER — Encounter: Payer: Self-pay | Admitting: Cardiovascular Disease

## 2021-12-16 ENCOUNTER — Telehealth: Payer: Self-pay | Admitting: *Deleted

## 2021-12-16 ENCOUNTER — Ambulatory Visit (INDEPENDENT_AMBULATORY_CARE_PROVIDER_SITE_OTHER): Payer: Medicare Other | Admitting: Cardiovascular Disease

## 2021-12-16 VITALS — BP 138/76 | HR 90 | Ht 74.0 in | Wt 214.2 lb

## 2021-12-16 DIAGNOSIS — E875 Hyperkalemia: Secondary | ICD-10-CM

## 2021-12-16 DIAGNOSIS — I2510 Atherosclerotic heart disease of native coronary artery without angina pectoris: Secondary | ICD-10-CM | POA: Diagnosis not present

## 2021-12-16 DIAGNOSIS — I6522 Occlusion and stenosis of left carotid artery: Secondary | ICD-10-CM | POA: Diagnosis not present

## 2021-12-16 DIAGNOSIS — I480 Paroxysmal atrial fibrillation: Secondary | ICD-10-CM

## 2021-12-16 DIAGNOSIS — N184 Chronic kidney disease, stage 4 (severe): Secondary | ICD-10-CM

## 2021-12-16 DIAGNOSIS — I6523 Occlusion and stenosis of bilateral carotid arteries: Secondary | ICD-10-CM

## 2021-12-16 NOTE — Telephone Encounter (Signed)
Thomas Bame, NP faxed [facility] [phone] X 2 pt's clearance for surgery.  [facility] has not received but Thomas Marshall did read Thomas Marshall's addendum. Will call pt to schedule pts surgery. Sharyn Lull is aware.

## 2021-12-16 NOTE — Progress Notes (Signed)
Cardiology Office Note:    Date:  12/16/2021   ID:  Thomas Marshall, DOB 1941/06/28, MRN [id number]  PCP:  [name], MD   Thomas Marshall Providers Cardiologist:  [name], MD Electrophysiologist:  Cristopher Peru, MD     Referring MD: [name], MD   No chief complaint on file.   History of Present Illness:    Thomas Marshall is a 81 y.o. male with a hx of CAD s/p CABG x 4 2004, symptomatic PVCs s/p ablation 2018 with marked reduction in PVCs from 25% to 7%, PPM, PAF on chronic anticoagulation, carotid artery disease s/p Rt endarterectomy, > 80% left carotid stenosis, DM, HTN, hyperlipidemia, CKD, and small AAA.   The patient is here with his wife today.  He just had an echocardiogram which showed normal LV and RV function with mild aortic valve calcification but no significant aortic stenosis.  He requires cardiac clearance for left carotid endarterectomy.  Other than generalized fatigue and stable exertional dyspnea, he has no new complaints.  Specifically denies edema, orthopnea, PND, heart palpitations, chest pain.  Overall he feels that he is doing well from a cardiac perspective and has no concerns today.  He is eager to have carotid endarterectomy performed as he is concerned about his degree of blockage in the left carotid artery.  Past Medical History:  Diagnosis Date   AAA (abdominal aortic aneurysm)    Arthritis    Cancer ([place])    skin & squamous cell   Carotid artery disease ([place])    a. s/p R CEA 12/2015.   CKD (chronic kidney disease), stage III ([place])    a. per historical labs.   Coronary artery disease    a. s/p CABG 2004.   Diabetes mellitus without complication (HCC)    GERD (gastroesophageal reflux disease)    Hyperlipidemia    Hypertension    Medication intolerance Multiple    PAF (paroxysmal atrial fibrillation) (HCC)    Pneumonia    Sinus bradycardia    a. baseline HR 50s-60s, also h/o bradycardia on metoprolol and carvedilol    Past Surgical  History:  Procedure Laterality Date   ANKLE SURGERY     right fused   BACK SURGERY     low back    CARDIOVERSION N/A 02/14/2020   Procedure: CARDIOVERSION;  Surgeon: Geralynn Rile, MD;  Location: [place];  Service: Cardiovascular;  Laterality: N/A;   CARDIOVERSION N/A 02/26/2020   Procedure: CARDIOVERSION;  Surgeon: [name], MD;  Location: [facility] ENDOSCOPY;  Service: Cardiovascular;  Laterality: N/A;   CARDIOVERSION N/A 04/12/2020   Procedure: CARDIOVERSION;  Surgeon: Skeet Latch, MD;  Location: [facility] ENDOSCOPY;  Service: Cardiovascular;  Laterality: N/A;   CHOLECYSTECTOMY N/A 12/07/2013   Procedure: LAPAROSCOPIC CHOLECYSTECTOMY ;  Surgeon: [name], MD;  Location: [facility] OR;  Service: General;  Laterality: N/A;   COLONOSCOPY     CORONARY ARTERY BYPASS GRAFT  2004   [place] N/A 05/21/2017   Procedure: Coronary Stent Intervention;  Surgeon: [name], MD;  Location: [place] CV LAB;  Service: Cardiovascular;  Laterality: N/A;   ENDARTERECTOMY Right 12/10/2015   Procedure: Right Carotid ENDARTERECTOMY;  Surgeon: Conrad [place], MD;  Location: [place];  Service: Vascular;  Laterality: Right;   EXPLORATION POST OPERATIVE OPEN HEART     FINGER SURGERY     skin graft on rt index   FINGER SURGERY Right    right index finger.  HERNIA REPAIR Right    RIH   JOINT REPLACEMENT     KNEE SURGERY     replacement on both knees   LEFT HEART CATH AND CORS/GRAFTS ANGIOGRAPHY N/A 05/21/2017   Procedure: Left Heart Cath and Cors/Grafts Angiography;  Surgeon: [name], MD;  Location: [place] CV LAB;  Service: Cardiovascular;  Laterality: N/A;   LEFT HEART CATH AND CORS/GRAFTS ANGIOGRAPHY N/A 05/20/2020   Procedure: LEFT HEART CATH AND CORS/GRAFTS ANGIOGRAPHY;  Surgeon: [name], MD;  Location: [place] CV LAB;  Service: Cardiovascular;  Laterality: N/A;   PACEMAKER IMPLANT N/A 03/14/2020   Procedure: PACEMAKER IMPLANT;   Surgeon: Evans Lance, MD;  Location: [place] CV LAB;  Service: Cardiovascular;  Laterality: N/A;   PROSTATE SURGERY     PVC ABLATION N/A 10/27/2017   Procedure: PVC ABLATION;  Surgeon: Evans Lance, MD;  Location: [place] CV LAB;  Service: Cardiovascular;  Laterality: N/A;   TONSILLECTOMY     URINARY SURGERY     scar tissue    Current Medications: Current Meds  Medication Sig   ACCU-CHEK GUIDE test strip USE 1 NEW TEST STRIP ONCE DAILY TO TEST BLOOD SUGAR   Accu-Chek Softclix Lancets lancets USE 1 TWICE DAILY USE AS DIRECTED E11.9   apixaban (ELIQUIS) 2.5 MG TABS tablet Take 1 tablet (2.5 mg total) by mouth 2 (two) times daily. (0800 & 2000)   aspirin EC 81 MG tablet Take 1 tablet (81 mg total) by mouth daily.   atorvastatin (LIPITOR) 40 MG tablet Take 40 mg by mouth at bedtime.    carvedilol (COREG) 6.25 MG tablet Take 1 tablet (6.25 mg total) by mouth 2 (two) times daily.   cholecalciferol (VITAMIN D3) 25 MCG (1000 UNIT) tablet Take 1,000 Units by mouth daily.   glucose blood test strip Accu-Check Guide Strips. Dx E11.9 Use to test blood glucose level 1 time a day.   hydrALAZINE (APRESOLINE) 10 MG tablet Take 10 mg by mouth 3 (three) times daily as needed (elevated bp greater than 160).   magnesium oxide (MAG-OX) 400 MG tablet Take 800 mg by mouth 2 (two) times daily.    meclizine (ANTIVERT) 25 MG tablet Take 1 tablet (25 mg total) by mouth 3 (three) times daily as needed for dizziness.   nitroGLYCERIN (NITROSTAT) 0.4 MG SL tablet Place 1 tablet (0.4 mg total) under the tongue every 5 (five) minutes as needed for chest pain (x 3 doses).   pantoprazole (PROTONIX) 40 MG tablet Take 1 tablet (40 mg total) by mouth daily.   terazosin (HYTRIN) 1 MG capsule Take 1 mg by mouth at bedtime.    vitamin C (ASCORBIC ACID) 500 MG tablet Take 500 mg by mouth daily with breakfast.    Zinc 22.5 MG TABS Take 22.5 mg by mouth every other day.     Allergies:   Lisinopril, Losartan,  Nifedipine, Triamterene, Hydralazine hcl, Losartan potassium-hctz, Amoxicillin, Ampicillin, and Carvedilol   Social History   Socioeconomic History   Marital status: Married    Spouse name: Not on file   Number of children: 2   Years of education: Not on file   Highest education level: Not on file  Occupational History   Occupation: retired  Tobacco Use   Smoking status: Former    Packs/day: 1.00    Years: 13.00    Pack years: 13.00    Types: Cigarettes    Quit date: 11/30/1970    Years since quitting: 51.0   Smokeless tobacco:  Never  Vaping Use   Vaping Use: Never used  Substance and Sexual Activity   Alcohol use: No    Alcohol/week: 0.0 standard drinks   Drug use: No   Sexual activity: Yes  Other Topics Concern   Not on file  Social History Narrative   Not on file   Social Determinants of Health   Financial Resource Strain: Not on file  Food Insecurity: Not on file  Transportation Needs: Not on file  Physical Activity: Not on file  Stress: Not on file  Social Connections: Not on file     Family History: The patient's family history includes Cancer in his sister; Diabetes in his brother and sister; Heart disease in his brother, father, and mother; Hyperlipidemia in his brother and father; Hypertension in his brother, father, and sister; Stroke in his mother. There is no history of Early death.  ROS:   Please see the history of present illness.    All other systems reviewed and are negative.  EKGs/Labs/Other Studies Reviewed:    The following studies were reviewed today: Echo 12/10/2021: 1. Left ventricular ejection fraction, by estimation, is 55 to 60%. The  left ventricle has normal function. The left ventricle has no regional  wall motion abnormalities. Left ventricular diastolic function could not  be evaluated.   2. Right ventricular systolic function is normal. The right ventricular  size is normal. There is normal pulmonary artery systolic pressure. The   estimated right ventricular systolic pressure is 29.7 mmHg.   3. Left atrial size was severely dilated.   4. The mitral valve is degenerative. Mild mitral valve regurgitation.  Moderate mitral annular calcification.   5. The aortic valve is tricuspid. There is moderate calcification of the  aortic valve. There is moderate thickening of the aortic valve. Aortic  valve regurgitation is not visualized. Mild aortic valve stenosis. Aortic  valve area, by VTI measures 1.77  cm. Aortic valve mean gradient measures 5.0 mmHg. Aortic valve Vmax  measures 1.67 m/s.   6. Aortic dilatation noted. There is mild dilatation of the aortic root,  measuring 41 mm. There is severe dilatation of the ascending aorta,  measuring 44 mm.   7. The inferior vena cava is normal in size with greater than 50%  respiratory variability, suggesting right atrial pressure of 3 mmHg.   Comparison(s): Changes from prior study are noted. Aortic stenosis is  mild. Low flow noted (SVI=22 cc/m2).    Recent Labs: 10/15/2021: ALT 19; TSH 1.59 12/08/2021: Hemoglobin 14.8; Platelets 118 12/15/2021: BUN 27; Creatinine, Ser 1.75; Potassium 5.5; Sodium 144  Recent Lipid Panel    Component Value Date/Time   CHOL 111 10/15/2021 1357   TRIG 97.0 10/15/2021 1357   HDL 41.40 10/15/2021 1357   CHOLHDL 3 10/15/2021 1357   VLDL 19.4 10/15/2021 1357   LDLCALC 51 10/15/2021 1357   LDLDIRECT 67.0 09/16/2016 1455     Risk Assessment/Calculations:    CHA2DS2-VASc Score = 4   This indicates a 4.8% annual risk of stroke. The patient's score is based upon: CHF History: 0 HTN History: 1 Diabetes History: 0 Stroke History: 0 Vascular Disease History: 1 Age Score: 2 Gender Score: 0          Physical Exam:    VS:  BP 138/76    Pulse 90    Ht 6\' 2"  (1.88 m)    Wt 214 lb 3.2 oz (97.2 kg)    SpO2 95%    BMI 27.50 kg/m  Wt Readings from Last 3 Encounters:  12/16/21 214 lb 3.2 oz (97.2 kg)  12/08/21 214 lb 4.8 oz (97.2 kg)   12/04/21 215 lb (97.5 kg)     GEN:  Well nourished, well developed in no acute distress HEENT: Normal NECK: No JVD; Left carotid bruit LYMPHATICS: No lymphadenopathy CARDIAC: irregularly irregular, soft ejection murmur at the RUSB RESPIRATORY:  Clear to auscultation without rales, wheezing or rhonchi  ABDOMEN: Soft, non-tender, non-distended MUSCULOSKELETAL:  No edema; No deformity  SKIN: Warm and dry NEUROLOGIC:  Alert and oriented x 3 PSYCHIATRIC:  Normal affect   ASSESSMENT:    1. Coronary artery disease involving native coronary artery of native heart without angina pectoris   2. Paroxysmal atrial fibrillation (HCC)   3. Stenosis of left carotid artery   4. Hyperkalemia   5. CKD (chronic kidney disease) stage 4, GFR 15-29 ml/min (HCC)    PLAN:    In order of problems listed above:  The patient is stable and has no symptoms of angina.  I think he can proceed with carotid endarterectomy at low risk of cardiovascular complications.  His LV and RV function are normal on his recent echocardiogram. Chronically rate controlled and anticoagulated.  He will be instructed to hold apixaban 48 hours prior to carotid endarterectomy. Plans for left carotid endarterectomy per Dr. Scot Dock.  Continue risk reduction measures. Discussed low potassium diet.  Potassium is ranged 5.3 to 5.5 mg/dL.  He is not on any offending medications such as an ACE inhibitor or ARB.  He takes no supplemental potassium. Overall appears stable.  I reviewed his most recent labs with a creatinine of 1.72.           Medication Adjustments/Labs and Tests Ordered: Current medicines are reviewed at length with the patient today.  Concerns regarding medicines are outlined above.  No orders of the defined types were placed in this encounter.  No orders of the defined types were placed in this encounter.   Patient Instructions  Medication Instructions:  Your physician recommends that you continue on your  current medications as directed. Please refer to the Current Medication list given to you today.  *If you need a refill on your cardiac medications before your next appointment, please call your pharmacy*   Lab Work: NONE If you have labs (blood work) drawn today and your tests are completely normal, you will receive your results only by: [place] (if you have MyChart) OR A paper copy in the mail If you have any lab test that is abnormal or we need to change your treatment, we will call you to review the results.   Testing/Procedures: NONE   Follow-Up: At [facility], you and your health needs are our priority.  As part of our continuing mission to provide you with exceptional heart care, we have created designated Provider Care Teams.  These Care Teams include your primary Cardiologist (physician) and Advanced Practice Providers (APPs -  Physician Assistants and Nurse Practitioners) who all work together to provide you with the care you need, when you need it.   Your next appointment:   6 month(s)  The format for your next appointment:   In Person  Provider:   Melina Copa, PA-C, [name], PA-C, Christen Bame, NP, or Richardson Dopp, PA-C       Then, [name], MD will plan to see you again in 12 month(s).      Signed, [name], MD  12/16/2021 11:22 AM  [facility]

## 2021-12-16 NOTE — Patient Instructions (Signed)
Medication Instructions:  Your physician recommends that you continue on your current medications as directed. Please refer to the Current Medication list given to you today.  *If you need a refill on your cardiac medications before your next appointment, please call your pharmacy*   Lab Work: NONE If you have labs (blood work) drawn today and your tests are completely normal, you will receive your results only by: [place] (if you have MyChart) OR A paper copy in the mail If you have any lab test that is abnormal or we need to change your treatment, we will call you to review the results.   Testing/Procedures: NONE   Follow-Up: At [facility], you and your health needs are our priority.  As part of our continuing mission to provide you with exceptional heart care, we have created designated Provider Care Teams.  These Care Teams include your primary Cardiologist (physician) and Advanced Practice Providers (APPs -  Physician Assistants and Nurse Practitioners) who all work together to provide you with the care you need, when you need it.   Your next appointment:   6 month(s)  The format for your next appointment:   In Person  Provider:   Melina Copa, PA-C, [name], PA-C, Christen Bame, NP, or Richardson Dopp, PA-C       Then, [name], MD will plan to see you again in 12 month(s).

## 2021-12-25 ENCOUNTER — Encounter: Payer: Self-pay | Admitting: Internal Medicine

## 2021-12-25 NOTE — Progress Notes (Signed)
[facility]ICE PROGRAMM[facility]  Patient Information: Name:  BURHAN BARHAM  DOB:  [birth year]  MRN:  [id number]    Planned Procedure:  Left Carotid Endarterectomy  Surgeon:  Dr. Scot Dock  Date of Procedure:  12/30/21 @ 0729  Position during surgery:  Supine   Please send documentation back to:  [facility] (Fax # [phone])   Device Information:  Clinic EP Physician:  Cristopher Peru, MD   Device Type:  Pacemaker Manufacturer and Phone #:  [employer]: [phone] Pacemaker Dependent?:  No. Date of Last Device Check:  12/15/2021 Normal Device Function?:  Yes.    Electrophysiologist's Recommendations:  Have magnet available. Provide continuous ECG monitoring when magnet is used or reprogramming is to be performed.  Procedure may interfere with device function.  Magnet should be placed over device during procedure.  Per Device Clinic Standing Orders, Simone Curia, RN  9:33 AM 12/25/2021

## 2021-12-25 NOTE — Pre-Procedure Instructions (Signed)
Surgical Instructions    Your procedure is scheduled on Tuesday, December 30, 2021 at 7:29 AM.  Report to [facility] Main Entrance "A" at 5:30 A.M., then check in with the Admitting office.  Call this number if you have problems the morning of surgery:  [phone]   If you have any questions prior to your surgery date call [phone]: Open Monday-Friday 8am-4pm    Remember:  Do not eat or drink after midnight the night before your surgery    Take these medicines the morning of surgery with A SIP OF WATER:  carvedilol (COREG) pantoprazole (PROTONIX)  IF NEEDED: hydrALAZINE (APRESOLINE)  meclizine (ANTIVERT) nitroGLYCERIN (NITROSTAT)   Follow your surgeon's instructions on when to stop Aspirin and apixaban (ELIQUIS).  If no instructions were given by your surgeon then you will need to call the office to get those instructions.     As of today, STOP taking any Aleve, Naproxen, Ibuprofen, Motrin, Advil, Goody's, BC's, all herbal medications, fish oil, and all vitamins.   HOW TO MANAGE YOUR DIABETES BEFORE AND AFTER SURGERY  Why is it important to control my blood sugar before and after surgery? Improving blood sugar levels before and after surgery helps healing and can limit problems. A way of improving blood sugar control is eating a healthy diet by:  Eating less sugar and carbohydrates  Increasing activity/exercise  Talking with your doctor about reaching your blood sugar goals High blood sugars (greater than 180 mg/dL) can raise your risk of infections and slow your recovery, so you will need to focus on controlling your diabetes during the weeks before surgery. Make sure that the doctor who takes care of your diabetes knows about your planned surgery including the date and location.  How do I manage my blood sugar before surgery? Check your blood sugar at least 4 times a day, starting 2 days before surgery, to make sure that the level is not too high or low.  Check  your blood sugar the morning of your surgery when you wake up and every 2 hours until you get to the Short Stay unit.  If your blood sugar is less than 70 mg/dL, you will need to treat for low blood sugar: Do not take insulin. Treat a low blood sugar (less than 70 mg/dL) with  cup of clear juice (cranberry or apple), 4 glucose tablets, OR glucose gel. Recheck blood sugar in 15 minutes after treatment (to make sure it is greater than 70 mg/dL). If your blood sugar is not greater than 70 mg/dL on recheck, call [phone] for further instructions. Report your blood sugar to the short stay nurse when you get to Short Stay.  If you are admitted to the hospital after surgery: Your blood sugar will be checked by the staff and you will probably be given insulin after surgery (instead of oral diabetes medicines) to make sure you have good blood sugar levels. The goal for blood sugar control after surgery is 80-180 mg/dL.                      Do NOT Smoke (Tobacco/Vaping) for 24 hours prior to your procedure.  If you use a CPAP at night, you may bring your mask/headgear for your overnight stay.   Contacts, glasses, piercing's, hearing aid's, dentures or partials may not be worn into surgery, please bring cases for these belongings.    For patients admitted to the hospital, discharge time will be determined by your treatment team.  Patients discharged the day of surgery will not be allowed to drive home, and someone needs to stay with them for 24 hours.  NO VISITORS WILL BE ALLOWED IN PRE-OP WHERE PATIENTS ARE PREPPED FOR SURGERY.  ONLY 1 SUPPORT PERSON MAY BE PRESENT IN THE WAITING ROOM WHILE YOU ARE IN SURGERY.  IF YOU ARE TO BE ADMITTED, ONCE YOU ARE IN YOUR ROOM YOU WILL BE ALLOWED TWO (2) VISITORS. (1) VISITOR MAY STAY OVERNIGHT BUT MUST ARRIVE TO THE ROOM BY 8pm.  Minor children may have two parents present. Special consideration for safety and communication needs will be reviewed on a case by  case basis.   Special instructions:   [place]- Preparing For Surgery  Before surgery, you can play an important role. Because skin is not sterile, your skin needs to be as free of germs as possible. You can reduce the number of germs on your skin by washing with CHG (chlorahexidine gluconate) Soap before surgery.  CHG is an antiseptic cleaner which kills germs and bonds with the skin to continue killing germs even after washing.    Oral Hygiene is also important to reduce your risk of infection.  Remember - BRUSH YOUR TEETH THE MORNING OF SURGERY WITH YOUR REGULAR TOOTHPASTE  Please do not use if you have an allergy to CHG or antibacterial soaps. If your skin becomes reddened/irritated stop using the CHG.  Do not shave (including legs and underarms) for at least 48 hours prior to first CHG shower. It is OK to shave your face.  Please follow these instructions carefully.   Shower the NIGHT BEFORE SURGERY and the MORNING OF SURGERY  If you chose to wash your hair, wash your hair first as usual with your normal shampoo.  After you shampoo, rinse your hair and body thoroughly to remove the shampoo.  Use CHG Soap as you would any other liquid soap. You can apply CHG directly to the skin and wash gently with a scrungie or a clean washcloth.   Apply the CHG Soap to your body ONLY FROM THE NECK DOWN.  Do not use on open wounds or open sores. Avoid contact with your eyes, ears, mouth and genitals (private parts). Wash Face and genitals (private parts)  with your normal soap.   Wash thoroughly, paying special attention to the area where your surgery will be performed.  Thoroughly rinse your body with warm water from the neck down.  DO NOT shower/wash with your normal soap after using and rinsing off the CHG Soap.  Pat yourself dry with a CLEAN TOWEL.  Wear CLEAN PAJAMAS to bed the night before surgery  Place CLEAN SHEETS on your bed the night before your surgery  DO NOT SLEEP WITH  PETS.   Day of Surgery: Shower with CHG soap. Do not wear jewelry. Do not wear lotions, powders, colognes, or deodorant. Do not shave 48 hours prior to surgery.  Men may shave face and neck. Do not bring valuables to the hospital. [facility] is not responsible for any belongings or valuables. Wear Clean/Comfortable clothing the morning of surgery Remember to brush your teeth WITH YOUR REGULAR TOOTHPASTE.   Please read over the following fact sheets that you were given.   3 days prior to your procedure or After your COVID test   You are not required to quarantine however you are required to wear a well-fitting mask when you are out and around people not in your household. If your mask becomes wet or  soiled, replace with a new one.   Wash your hands often with soap and water for 20 seconds or clean your hands with an alcohol-based hand sanitizer that contains at least 60% alcohol.   Do not share personal items.   Notify your provider:  o if you are in close contact with someone who has COVID  o or if you develop a fever of 100.4 or greater, sneezing, cough, sore throat, shortness of breath or body aches.

## 2021-12-25 NOTE — Progress Notes (Signed)
Remote pacemaker transmission.   

## 2021-12-26 ENCOUNTER — Other Ambulatory Visit: Payer: Self-pay

## 2021-12-26 ENCOUNTER — Encounter (HOSPITAL_COMMUNITY): Payer: Self-pay

## 2021-12-26 ENCOUNTER — Other Ambulatory Visit: Payer: Self-pay | Admitting: Vascular Surgery

## 2021-12-26 ENCOUNTER — Encounter (HOSPITAL_COMMUNITY)
Admission: RE | Admit: 2021-12-26 | Discharge: 2021-12-26 | Disposition: A | Discharge status: Home / Self Care | Payer: Medicare Other | Source: Ambulatory Visit | Attending: Vascular Surgery | Admitting: Vascular Surgery

## 2021-12-26 VITALS — BP 127/87 | HR 89 | Temp 98.7°F | Resp 17 | Ht 74.0 in | Wt 214.0 lb

## 2021-12-26 DIAGNOSIS — Z7901 Long term (current) use of anticoagulants: Secondary | ICD-10-CM | POA: Insufficient documentation

## 2021-12-26 DIAGNOSIS — E1122 Type 2 diabetes mellitus with diabetic chronic kidney disease: Secondary | ICD-10-CM | POA: Diagnosis not present

## 2021-12-26 DIAGNOSIS — E785 Hyperlipidemia, unspecified: Secondary | ICD-10-CM | POA: Insufficient documentation

## 2021-12-26 DIAGNOSIS — I480 Paroxysmal atrial fibrillation: Secondary | ICD-10-CM | POA: Insufficient documentation

## 2021-12-26 DIAGNOSIS — I495 Sick sinus syndrome: Secondary | ICD-10-CM | POA: Diagnosis not present

## 2021-12-26 DIAGNOSIS — I129 Hypertensive chronic kidney disease with stage 1 through stage 4 chronic kidney disease, or unspecified chronic kidney disease: Secondary | ICD-10-CM | POA: Diagnosis not present

## 2021-12-26 DIAGNOSIS — N1832 Chronic kidney disease, stage 3b: Secondary | ICD-10-CM | POA: Insufficient documentation

## 2021-12-26 DIAGNOSIS — Z950 Presence of cardiac pacemaker: Secondary | ICD-10-CM | POA: Insufficient documentation

## 2021-12-26 DIAGNOSIS — I350 Nonrheumatic aortic (valve) stenosis: Secondary | ICD-10-CM | POA: Insufficient documentation

## 2021-12-26 DIAGNOSIS — Z01818 Encounter for other preprocedural examination: Secondary | ICD-10-CM

## 2021-12-26 DIAGNOSIS — Z01812 Encounter for preprocedural laboratory examination: Secondary | ICD-10-CM | POA: Insufficient documentation

## 2021-12-26 DIAGNOSIS — I6523 Occlusion and stenosis of bilateral carotid arteries: Secondary | ICD-10-CM | POA: Insufficient documentation

## 2021-12-26 HISTORY — DX: Presence of cardiac pacemaker: Z95.0

## 2021-12-26 LAB — COMPREHENSIVE METABOLIC PANEL
ALT: 22 U/L (ref 0–44)
AST: 21 U/L (ref 15–41)
Albumin: 3.8 g/dL (ref 3.5–5.0)
Alkaline Phosphatase: 69 U/L (ref 38–126)
Anion gap: 6 (ref 5–15)
BUN: 26 mg/dL — ABNORMAL HIGH (ref 8–23)
CO2: 29 mmol/L (ref 22–32)
Calcium: 8.9 mg/dL (ref 8.9–10.3)
Chloride: 105 mmol/L (ref 98–111)
Creatinine, Ser: 1.81 mg/dL — ABNORMAL HIGH (ref 0.61–1.24)
GFR, Estimated: 37 mL/min — ABNORMAL LOW (ref >60)
Glucose, Bld: 126 mg/dL — ABNORMAL HIGH (ref 70–99)
Potassium: 4.4 mmol/L (ref 3.5–5.1)
Sodium: 140 mmol/L (ref 135–145)
Total Bilirubin: 1 mg/dL (ref 0.3–1.2)
Total Protein: 6.3 g/dL — ABNORMAL LOW (ref 6.5–8.1)

## 2021-12-26 LAB — URINALYSIS, ROUTINE W REFLEX MICROSCOPIC
Bilirubin Urine: NEGATIVE
Glucose, UA: NEGATIVE mg/dL
Hgb urine dipstick: NEGATIVE
Ketones, ur: NEGATIVE mg/dL
Leukocytes,Ua: NEGATIVE
Nitrite: NEGATIVE
Protein, ur: 30 mg/dL — AB
Specific Gravity, Urine: 1.02 (ref 1.005–1.030)
pH: 5 (ref 5.0–8.0)

## 2021-12-26 LAB — HEMOGLOBIN A1C
Hgb A1c MFr Bld: 6.7 % — ABNORMAL HIGH (ref 4.8–5.6)
Mean Plasma Glucose: 145.59 mg/dL

## 2021-12-26 LAB — CBC
HCT: 43.1 % (ref 39.0–52.0)
Hemoglobin: 13.8 g/dL (ref 13.0–17.0)
MCH: 32.8 pg (ref 26.0–34.0)
MCHC: 32 g/dL (ref 30.0–36.0)
MCV: 102.4 fL — ABNORMAL HIGH (ref 80.0–100.0)
Platelets: UNDETERMINED 10*3/uL (ref 150–400)
RBC: 4.21 MIL/uL — ABNORMAL LOW (ref 4.22–5.81)
RDW: 12.5 % (ref 11.5–15.5)
WBC: 6.7 10*3/uL (ref 4.0–10.5)
nRBC: 0 % (ref 0.0–0.2)

## 2021-12-26 LAB — PROTIME-INR
INR: 1.2 (ref 0.8–1.2)
Prothrombin Time: 15.1 seconds (ref 11.4–15.2)

## 2021-12-26 LAB — TYPE AND SCREEN
ABO/RH(D): A POS
Antibody Screen: NEGATIVE

## 2021-12-26 LAB — APTT: aPTT: 33 seconds (ref 24–36)

## 2021-12-26 LAB — SARS CORONAVIRUS 2 (TAT 6-24 HRS): SARS Coronavirus 2: NEGATIVE

## 2021-12-26 LAB — GLUCOSE, CAPILLARY: Glucose-Capillary: 177 mg/dL — ABNORMAL HIGH (ref 70–99)

## 2021-12-26 LAB — SURGICAL PCR SCREEN
MRSA, PCR: NEGATIVE
Staphylococcus aureus: NEGATIVE

## 2021-12-26 NOTE — Progress Notes (Signed)
PCP - Dr. Cathlean Cower Cardiologist - Dr. [name] Nephrologist: Dr. Donato Heinz  PPM/ICD - Pacemaker Device Orders - Yes Rep Notified - Yes  Chest x-ray - N/A EKG - 01/01/21 Stress Test - 08/16/13 ECHO - 12/10/21 Cardiac Cath - 05/20/20  Sleep Study - Yes, no OSA  Fasting Blood Sugar - 120-130's Checks Blood Sugar __1-2___ times a day  Blood Thinner Instructions: Stop Eliquis 2 days before procedure per MD. Aspirin Instructions: Continue per MD.  ERAS Protcol - No  COVID TEST- 12/26/21 at [facility] per pt   Anesthesia review: Yes, cardiac hx  Patient denies shortness of breath, fever, cough and chest pain at PAT appointment   All instructions explained to the patient, with a verbal understanding of the material. Patient agrees to go over the instructions while at home for a better understanding. Patient also instructed to self quarantine after being tested for COVID-19. The opportunity to ask questions was provided.

## 2021-12-29 NOTE — Progress Notes (Signed)
Anesthesia Chart Review:  Follows with cardiology for history of HTN, HLD, mild AS (by echo 11/2021), CAD s/p CABG x 4 2004, symptomatic PVCs s/p ablation 2018 with marked reduction in PVCs from 25% to 7%, sinus node dysfunction s/p PPM, PAF on chronic anticoagulation, carotid artery disease s/p Rt endarterectomy, small AAA.  Seen by Dr. Burt Knack 12/16/2021 for preop evaluation.  Per note, "The patient is stable and has no symptoms of angina.  I think he can proceed with carotid endarterectomy at low risk of cardiovascular complications.  His LV and RV function are normal on his recent echocardiogram." He was instructed to hold Eliquis 48 hours prior to endarterectomy.  CKD 3 followed by [place] [facility].  DM2 well-controlled, A1c 6.7 on preop labs.  Preop labs reviewed, creatinine elevated 1.81 which appears to be near baseline.  Labs otherwise unremarkable.  Perioperative prescription for implanted cardiac device programming per progress note 12/25/2021: Device Information:   Clinic EP Physician:  Cristopher Peru, MD    Device Type:  Pacemaker Manufacturer and Phone #:  [employer]. [employer]ott: [phone] Pacemaker Dependent?:  No. Date of Last Device Check:  12/15/2021       Normal Device Function?:  Yes.     Electrophysiologist's Recommendations:   Have magnet available. Provide continuous ECG monitoring when magnet is used or reprogramming is to be performed.  Procedure may interfere with device function.  Magnet should be placed over device during procedure.  CT chest 12/11/2021: IMPRESSION: The thoracic aorta is normal in caliber without aneurysm.   No acute findings in the chest.   TTE 12/10/2021:  1. Left ventricular ejection fraction, by estimation, is 55 to 60%. The  left ventricle has normal function. The left ventricle has no regional  wall motion abnormalities. Left ventricular diastolic function could not  be evaluated.   2. Right ventricular systolic function is normal. The  right ventricular  size is normal. There is normal pulmonary artery systolic pressure. The  estimated right ventricular systolic pressure is 96.7 mmHg.   3. Left atrial size was severely dilated.   4. The mitral valve is degenerative. Mild mitral valve regurgitation.  Moderate mitral annular calcification.   5. The aortic valve is tricuspid. There is moderate calcification of the  aortic valve. There is moderate thickening of the aortic valve. Aortic  valve regurgitation is not visualized. Mild aortic valve stenosis. Aortic  valve area, by VTI measures 1.77  cm. Aortic valve mean gradient measures 5.0 mmHg. Aortic valve Vmax  measures 1.67 m/s.   6. Aortic dilatation noted. There is mild dilatation of the aortic root,  measuring 41 mm. There is severe dilatation of the ascending aorta,  measuring 44 mm.   7. The inferior vena cava is normal in size with greater than 50%  respiratory variability, suggesting right atrial pressure of 3 mmHg.   Comparison(s): Changes from prior study are noted. Aortic stenosis is  mild. Low flow noted (SVI=22 cc/m2).  Cath 05/20/20: 1.  Severe native vessel coronary artery disease with total occlusion of the left main and total occlusion of the RCA 2.  Status post aortocoronary bypass surgery with continued patency of the RIMA to PDA, LIMA to LAD, saphenous vein graft to diagonal, and saphenous vein graft OM   Recommendations: Ongoing medical therapy.  No targets for intervention.  Note there is a moderately severe stenosis at the distal anastomosis of the saphenous vein graft OM, likely a mechanical issue that has been present since the time of surgery.  This is unchanged over time and there is brisk TIMI-3 flow, very unlikely to be related to the patient's symptoms.    Wynonia Musty [facility]/Anesthesiology Phone [phone] 12/29/2021 9:39 AM

## 2021-12-29 NOTE — Anesthesia Preprocedure Evaluation (Addendum)
Anesthesia Evaluation  Patient identified by MRN, date of birth, ID band Patient awake    Reviewed: Allergy & Precautions, NPO status , Patient's Chart, lab work & pertinent test results  History of Anesthesia Complications Negative for: history of anesthetic complications  Airway Mallampati: I  TM Distance: >3 FB Neck ROM: Full    Dental  (+) Edentulous Upper, Missing, Dental Advisory Given   Pulmonary former smoker,    Pulmonary exam normal        Cardiovascular hypertension, Pt. on medications and Pt. on home beta blockers + CAD, + Cardiac Stents, + CABG, + Peripheral Vascular Disease and + DOE  Normal cardiovascular exam+ dysrhythmias Atrial Fibrillation + pacemaker   TTE 12/10/2021: 1. Left ventricular ejection fraction, by estimation, is 55 to 60%. The  left ventricle has normal function. The left ventricle has no regional  wall motion abnormalities. Left ventricular diastolic function could not  be evaluated.  2. Right ventricular systolic function is normal. The right ventricular  size is normal. There is normal pulmonary artery systolic pressure. The  estimated right ventricular systolic pressure is 14.4 mmHg.  3. Left atrial size was severely dilated.  4. The mitral valve is degenerative. Mild mitral valve regurgitation.  Moderate mitral annular calcification.  5. The aortic valve is tricuspid. There is moderate calcification of the  aortic valve. There is moderate thickening of the aortic valve. Aortic  valve regurgitation is not visualized. Mild aortic valve stenosis. Aortic  valve area, by VTI measures 1.77  cm. Aortic valve mean gradient measures 5.0 mmHg. Aortic valve Vmax  measures 1.67 m/s.  6. Aortic dilatation noted. There is mild dilatation of the aortic root,  measuring 41 mm. There is severe dilatation of the ascending aorta,  measuring 44 mm.  7. The inferior vena cava is normal in size with  greater than 50%  respiratory variability, suggesting right atrial pressure of 3 mmHg.   Comparison(s): Changes from prior study are noted. Aortic stenosis is  mild. Low flow noted (SVI=22 cc/m2).  Cath 05/20/20: 1. Severe native vessel coronary artery disease with total occlusion of the left main and total occlusion of the RCA 2. Status post aortocoronary bypass surgery with continued patency of the RIMA to PDA, LIMA to LAD, saphenous vein graft to diagonal, and saphenous vein graft OM    '21 Carotid US - 60-79% left ICAS, patent right carotid s/p CEA  '18 TTE - Moderatefocal basal hypertrophy of the septum with otherwise moderate concentric hypertrophy. EF 55% to 60%. Trileaflet AV; severely thickened, severely calcified leaflets without AS. The non-coronary cusp is most affected. Noncoronary cusp mobility was severely restricted. Valve area (VTI): 2.39 cm^2. Mild MR. LA was moderately dilated. Trivial TR. PASP was mildly increased.     Neuro/Psych TIAnegative psych ROS   GI/Hepatic Neg liver ROS, GERD  Medicated and Controlled,  Endo/Other  diabetes, Type 2, Oral Hypoglycemic Agents K 5.3   Renal/GU CRFRenal disease     Musculoskeletal  (+) Arthritis ,   Abdominal   Peds  Hematology  (+) anemia ,   Anesthesia Other Findings    Reproductive/Obstetrics                            Anesthesia Physical  Anesthesia Plan  ASA: 3  Anesthesia Plan: General   Post-op Pain Management: Tylenol PO (pre-op) and Celebrex PO (pre-op)   Induction: Intravenous  PONV Risk Score and Plan: 4 or greater and  Ondansetron, Dexamethasone and Diphenhydramine  Airway Management Planned: Oral ETT  Additional Equipment: Arterial line  Intra-op Plan:   Post-operative Plan: Extubation in OR  Informed Consent: I have reviewed the patients History and Physical, chart, labs and discussed the procedure including the risks, benefits and alternatives for the  proposed anesthesia with the patient or authorized representative who has indicated his/her understanding and acceptance.     Dental advisory given  Plan Discussed with: Anesthesiologist and CRNA  Anesthesia Plan Comments: (PAT note by Karoline Caldwell, PA-C: Follows with cardiology for history of HTN, HLD, mild AS (by echo 11/2021), CAD s/p CABGx 4 2004, symptomatic PVCs s/p ablation 2018 with marked reduction in PVCs from 25% to 7%, sinus node dysfunction s/p PPM, PAFon chronic anticoagulation, carotid artery disease s/p Rt endarterectomy, small AAA.  Seen by Dr. Burt Knack 12/16/2021 for preop evaluation.  Per note, "The patient is stable and has no symptoms of angina.  I think he can proceed with carotid endarterectomy at low risk of cardiovascular complications.  His LV and RV function are normal on his recent echocardiogram." He was instructed to hold Eliquis 48 hours prior to endarterectomy.  CKD 3 followed by [place] [facility].  DM2 well-controlled, A1c 6.7 on preop labs.  Preop labs reviewed, creatinine elevated 1.81 which appears to be near baseline.  Labs otherwise unremarkable.  Perioperative prescription for implanted cardiac device programming per progress note 12/25/2021: Device Information:  Clinic EP Physician:Gregg Lovena Le, MD  Device Type:Pacemaker Manufacturer and Phone #:[employer]. [employer]ott: [phone] Pacemaker Dependent?:No. Date of Last Device Check:1/16/2023Normal Device Function?:Yes.  Electrophysiologist's Recommendations:   Have magnet available.  Provide continuous ECG monitoring when magnet is used or reprogramming is to be performed.  Procedure may interfere with device function. Magnet should be placed over device during procedure.  CT chest 12/11/2021: IMPRESSION: The thoracic aorta is normal in caliber without aneurysm.  No acute findings in the chest.  TTE 12/10/2021: 1. Left ventricular ejection fraction, by estimation,  is 55 to 60%. The  left ventricle has normal function. The left ventricle has no regional  wall motion abnormalities. Left ventricular diastolic function could not  be evaluated.  2. Right ventricular systolic function is normal. The right ventricular  size is normal. There is normal pulmonary artery systolic pressure. The  estimated right ventricular systolic pressure is 32.3 mmHg.  3. Left atrial size was severely dilated.  4. The mitral valve is degenerative. Mild mitral valve regurgitation.  Moderate mitral annular calcification.  5. The aortic valve is tricuspid. There is moderate calcification of the  aortic valve. There is moderate thickening of the aortic valve. Aortic  valve regurgitation is not visualized. Mild aortic valve stenosis. Aortic  valve area, by VTI measures 1.77  cm. Aortic valve mean gradient measures 5.0 mmHg. Aortic valve Vmax  measures 1.67 m/s.  6. Aortic dilatation noted. There is mild dilatation of the aortic root,  measuring 41 mm. There is severe dilatation of the ascending aorta,  measuring 44 mm.  7. The inferior vena cava is normal in size with greater than 50%  respiratory variability, suggesting right atrial pressure of 3 mmHg.   Comparison(s): Changes from prior study are noted. Aortic stenosis is  mild. Low flow noted (SVI=22 cc/m2).  Cath 05/20/20: 1. Severe native vessel coronary artery disease with total occlusion of the left main and total occlusion of the RCA 2. Status post aortocoronary bypass surgery with continued patency of the RIMA to PDA, LIMA to LAD, saphenous vein graft to diagonal,  and saphenous vein graft OM  Recommendations: Ongoing medical therapy. No targets for intervention. Note there is a moderately severe stenosis at the distal anastomosis of the saphenous vein graft OM, likely a mechanical issue that has been present since the time of surgery. This is unchanged over time and there is brisk TIMI-3 flow, very unlikely  to be related to the patient's symptoms.    )       Anesthesia Quick Evaluation

## 2021-12-29 NOTE — Anesthesia Preprocedure Evaluation (Deleted)
Anesthesia Evaluation    Airway        Dental   Pulmonary former smoker,           Cardiovascular hypertension,      Neuro/Psych    GI/Hepatic   Endo/Other  diabetes  Renal/GU      Musculoskeletal   Abdominal   Peds  Hematology   Anesthesia Other Findings   Reproductive/Obstetrics                             Anesthesia Physical Anesthesia Plan  ASA:   Anesthesia Plan:    Post-op Pain Management:    Induction:   PONV Risk Score and Plan:   Airway Management Planned:   Additional Equipment:   Intra-op Plan:   Post-operative Plan:   Informed Consent:   Plan Discussed with:   Anesthesia Plan Comments: (PAT note by Karoline Caldwell, PA-C: Follows with cardiology for history of HTN, HLD, mild AS (by echo 11/2021), CAD s/p CABGx 4 2004, symptomatic PVCs s/p ablation 2018 with marked reduction in PVCs from 25% to 7%, sinus node dysfunction s/p PPM, PAFon chronic anticoagulation, carotid artery disease s/p Rt endarterectomy, small AAA.  Seen by Dr. Burt Knack 12/16/2021 for preop evaluation.  Per note, "The patient is stable and has no symptoms of angina.  I think he can proceed with carotid endarterectomy at low risk of cardiovascular complications.  His LV and RV function are normal on his recent echocardiogram." He was instructed to hold Eliquis 48 hours prior to endarterectomy.  CKD 3 followed by [place] [facility].  DM2 well-controlled, A1c 6.7 on preop labs.  Preop labs reviewed, creatinine elevated 1.81 which appears to be near baseline.  Labs otherwise unremarkable.  Perioperative prescription for implanted cardiac device programming per progress note 12/25/2021: Device Information:  Clinic EP Physician:Gregg Lovena Le, MD  Device Type:Pacemaker Manufacturer and Phone #:[employer]. Jude/Abbott: [phone] Pacemaker Dependent?:No. Date of Last Device Check:1/16/2023Normal  Device Function?:Yes.  Electrophysiologist's Recommendations:   Have magnet available.  Provide continuous ECG monitoring when magnet is used or reprogramming is to be performed.  Procedure may interfere with device function. Magnet should be placed over device during procedure.  CT chest 12/11/2021: IMPRESSION: The thoracic aorta is normal in caliber without aneurysm.  No acute findings in the chest.  TTE 12/10/2021: 1. Left ventricular ejection fraction, by estimation, is 55 to 60%. The  left ventricle has normal function. The left ventricle has no regional  wall motion abnormalities. Left ventricular diastolic function could not  be evaluated.  2. Right ventricular systolic function is normal. The right ventricular  size is normal. There is normal pulmonary artery systolic pressure. The  estimated right ventricular systolic pressure is 50.0 mmHg.  3. Left atrial size was severely dilated.  4. The mitral valve is degenerative. Mild mitral valve regurgitation.  Moderate mitral annular calcification.  5. The aortic valve is tricuspid. There is moderate calcification of the  aortic valve. There is moderate thickening of the aortic valve. Aortic  valve regurgitation is not visualized. Mild aortic valve stenosis. Aortic  valve area, by VTI measures 1.77  cm. Aortic valve mean gradient measures 5.0 mmHg. Aortic valve Vmax  measures 1.67 m/s.  6. Aortic dilatation noted. There is mild dilatation of the aortic root,  measuring 41 mm. There is severe dilatation of the ascending aorta,  measuring 44 mm.  7. The inferior vena cava is normal in size with greater than 50%  respiratory  variability, suggesting right atrial pressure of 3 mmHg.   Comparison(s): Changes from prior study are noted. Aortic stenosis is  mild. Low flow noted (SVI=22 cc/m2).  Cath 05/20/20: 1. Severe native vessel coronary artery disease with total occlusion of the left main and total occlusion  of the RCA 2. Status post aortocoronary bypass surgery with continued patency of the RIMA to PDA, LIMA to LAD, saphenous vein graft to diagonal, and saphenous vein graft OM  Recommendations: Ongoing medical therapy. No targets for intervention. Note there is a moderately severe stenosis at the distal anastomosis of the saphenous vein graft OM, likely a mechanical issue that has been present since the time of surgery. This is unchanged over time and there is brisk TIMI-3 flow, very unlikely to be related to the patient's symptoms.   )        Anesthesia Quick Evaluation

## 2021-12-30 ENCOUNTER — Other Ambulatory Visit: Payer: Self-pay

## 2021-12-30 ENCOUNTER — Inpatient Hospital Stay (HOSPITAL_COMMUNITY): Payer: Medicare Other | Admitting: Anesthesiology

## 2021-12-30 ENCOUNTER — Encounter (HOSPITAL_COMMUNITY)
Admission: RE | Disposition: A | Discharge status: Home / Self Care | Payer: Self-pay | Source: Ambulatory Visit | Attending: Vascular Surgery

## 2021-12-30 ENCOUNTER — Encounter (HOSPITAL_COMMUNITY): Payer: Self-pay | Admitting: Vascular Surgery

## 2021-12-30 ENCOUNTER — Inpatient Hospital Stay (HOSPITAL_COMMUNITY)
Admission: RE | Admit: 2021-12-30 | Discharge: 2021-12-31 | DRG: 038 | Disposition: A | Discharge status: Home / Self Care | Payer: Medicare Other | Source: Ambulatory Visit | Attending: Vascular Surgery | Admitting: Vascular Surgery

## 2021-12-30 ENCOUNTER — Inpatient Hospital Stay (HOSPITAL_COMMUNITY): Payer: Medicare Other | Admitting: Vascular Surgery

## 2021-12-30 DIAGNOSIS — E1122 Type 2 diabetes mellitus with diabetic chronic kidney disease: Secondary | ICD-10-CM | POA: Diagnosis present

## 2021-12-30 DIAGNOSIS — I5032 Chronic diastolic (congestive) heart failure: Secondary | ICD-10-CM | POA: Diagnosis not present

## 2021-12-30 DIAGNOSIS — Z833 Family history of diabetes mellitus: Secondary | ICD-10-CM

## 2021-12-30 DIAGNOSIS — E1151 Type 2 diabetes mellitus with diabetic peripheral angiopathy without gangrene: Secondary | ICD-10-CM | POA: Diagnosis present

## 2021-12-30 DIAGNOSIS — K219 Gastro-esophageal reflux disease without esophagitis: Secondary | ICD-10-CM | POA: Diagnosis present

## 2021-12-30 DIAGNOSIS — Z951 Presence of aortocoronary bypass graft: Secondary | ICD-10-CM

## 2021-12-30 DIAGNOSIS — Z7982 Long term (current) use of aspirin: Secondary | ICD-10-CM | POA: Diagnosis not present

## 2021-12-30 DIAGNOSIS — Z7901 Long term (current) use of anticoagulants: Secondary | ICD-10-CM

## 2021-12-30 DIAGNOSIS — Z85828 Personal history of other malignant neoplasm of skin: Secondary | ICD-10-CM

## 2021-12-30 DIAGNOSIS — I4819 Other persistent atrial fibrillation: Secondary | ICD-10-CM | POA: Diagnosis not present

## 2021-12-30 DIAGNOSIS — I2510 Atherosclerotic heart disease of native coronary artery without angina pectoris: Secondary | ICD-10-CM | POA: Diagnosis not present

## 2021-12-30 DIAGNOSIS — I130 Hypertensive heart and chronic kidney disease with heart failure and stage 1 through stage 4 chronic kidney disease, or unspecified chronic kidney disease: Secondary | ICD-10-CM | POA: Diagnosis not present

## 2021-12-30 DIAGNOSIS — Z83438 Family history of other disorder of lipoprotein metabolism and other lipidemia: Secondary | ICD-10-CM | POA: Diagnosis not present

## 2021-12-30 DIAGNOSIS — I714 Abdominal aortic aneurysm, without rupture, unspecified: Secondary | ICD-10-CM | POA: Diagnosis present

## 2021-12-30 DIAGNOSIS — Z808 Family history of malignant neoplasm of other organs or systems: Secondary | ICD-10-CM

## 2021-12-30 DIAGNOSIS — Z823 Family history of stroke: Secondary | ICD-10-CM | POA: Diagnosis not present

## 2021-12-30 DIAGNOSIS — Z8249 Family history of ischemic heart disease and other diseases of the circulatory system: Secondary | ICD-10-CM | POA: Diagnosis not present

## 2021-12-30 DIAGNOSIS — Z7984 Long term (current) use of oral hypoglycemic drugs: Secondary | ICD-10-CM

## 2021-12-30 DIAGNOSIS — E785 Hyperlipidemia, unspecified: Secondary | ICD-10-CM | POA: Diagnosis present

## 2021-12-30 DIAGNOSIS — Z803 Family history of malignant neoplasm of breast: Secondary | ICD-10-CM | POA: Diagnosis not present

## 2021-12-30 DIAGNOSIS — Z950 Presence of cardiac pacemaker: Secondary | ICD-10-CM | POA: Diagnosis not present

## 2021-12-30 DIAGNOSIS — I6523 Occlusion and stenosis of bilateral carotid arteries: Secondary | ICD-10-CM | POA: Diagnosis not present

## 2021-12-30 DIAGNOSIS — Z87891 Personal history of nicotine dependence: Secondary | ICD-10-CM

## 2021-12-30 DIAGNOSIS — Z79899 Other long term (current) drug therapy: Secondary | ICD-10-CM | POA: Diagnosis not present

## 2021-12-30 DIAGNOSIS — I779 Disorder of arteries and arterioles, unspecified: Secondary | ICD-10-CM

## 2021-12-30 DIAGNOSIS — I6529 Occlusion and stenosis of unspecified carotid artery: Secondary | ICD-10-CM | POA: Diagnosis present

## 2021-12-30 DIAGNOSIS — Z20822 Contact with and (suspected) exposure to covid-19: Secondary | ICD-10-CM | POA: Diagnosis not present

## 2021-12-30 DIAGNOSIS — N183 Chronic kidney disease, stage 3 unspecified: Secondary | ICD-10-CM | POA: Diagnosis not present

## 2021-12-30 DIAGNOSIS — I6522 Occlusion and stenosis of left carotid artery: Secondary | ICD-10-CM | POA: Diagnosis not present

## 2021-12-30 DIAGNOSIS — I129 Hypertensive chronic kidney disease with stage 1 through stage 4 chronic kidney disease, or unspecified chronic kidney disease: Secondary | ICD-10-CM | POA: Diagnosis not present

## 2021-12-30 DIAGNOSIS — N189 Chronic kidney disease, unspecified: Secondary | ICD-10-CM | POA: Diagnosis not present

## 2021-12-30 DIAGNOSIS — D631 Anemia in chronic kidney disease: Secondary | ICD-10-CM | POA: Diagnosis not present

## 2021-12-30 HISTORY — PX: ENDARTERECTOMY: SHX5162

## 2021-12-30 LAB — CREATININE, SERUM
Creatinine, Ser: 1.67 mg/dL — ABNORMAL HIGH (ref 0.61–1.24)
GFR, Estimated: 41 mL/min — ABNORMAL LOW (ref >60)

## 2021-12-30 LAB — GLUCOSE, CAPILLARY
Glucose-Capillary: 132 mg/dL — ABNORMAL HIGH (ref 70–99)
Glucose-Capillary: 129 mg/dL — ABNORMAL HIGH (ref 70–99)

## 2021-12-30 LAB — POCT ACTIVATED CLOTTING TIME
Activated Clotting Time: 269 seconds
Activated Clotting Time: 137 seconds

## 2021-12-30 LAB — CBC
HCT: 35.3 % — ABNORMAL LOW (ref 39.0–52.0)
Hemoglobin: 11.4 g/dL — ABNORMAL LOW (ref 13.0–17.0)
MCH: 32.4 pg (ref 26.0–34.0)
MCHC: 32.3 g/dL (ref 30.0–36.0)
MCV: 100.3 fL — ABNORMAL HIGH (ref 80.0–100.0)
Platelets: 85 10*3/uL — ABNORMAL LOW (ref 150–400)
RBC: 3.52 MIL/uL — ABNORMAL LOW (ref 4.22–5.81)
RDW: 12.7 % (ref 11.5–15.5)
WBC: 4.8 10*3/uL (ref 4.0–10.5)
nRBC: 0 % (ref 0.0–0.2)

## 2021-12-30 SURGERY — ENDARTERECTOMY, CAROTID
Anesthesia: General | Laterality: Left

## 2021-12-30 MED ORDER — SODIUM CHLORIDE 0.9 % IV SOLN: INTRAVENOUS | Status: DC

## 2021-12-30 MED ORDER — ORAL CARE MOUTH RINSE: 15.0000 mL | Freq: Once | OROMUCOSAL | Status: AC

## 2021-12-30 MED ORDER — HEPARIN 6000 UNIT IRRIGATION SOLUTION
Status: DC | PRN
  Administered 2021-12-30: 1

## 2021-12-30 MED ORDER — CHLORHEXIDINE GLUCONATE CLOTH 2 % EX PADS: 6.0000 | MEDICATED_PAD | Freq: Once | CUTANEOUS | Status: DC

## 2021-12-30 MED ORDER — LIDOCAINE 2% (20 MG/ML) 5 ML SYRINGE
INTRAMUSCULAR | Status: AC
  Filled 2021-12-30: qty 5

## 2021-12-30 MED ORDER — SENNOSIDES-DOCUSATE SODIUM 8.6-50 MG PO TABS: 1.0000 | ORAL_TABLET | Freq: Every evening | ORAL | Status: DC | PRN

## 2021-12-30 MED ORDER — PHENOL 1.4 % MT LIQD: 1.0000 | OROMUCOSAL | Status: DC | PRN

## 2021-12-30 MED ORDER — LIDOCAINE-EPINEPHRINE (PF) 1 %-1:200000 IJ SOLN
INTRAMUSCULAR | Status: DC | PRN
  Administered 2021-12-30: 10 mL

## 2021-12-30 MED ORDER — THROMBIN (RECOMBINANT) 20000 UNITS EX SOLR
CUTANEOUS | Status: AC
  Filled 2021-12-30: qty 20000

## 2021-12-30 MED ORDER — ALUM & MAG HYDROXIDE-SIMETH 200-200-20 MG/5ML PO SUSP: 15.0000 mL | ORAL | Status: DC | PRN

## 2021-12-30 MED ORDER — ACETAMINOPHEN 650 MG RE SUPP: 325.0000 mg | RECTAL | Status: DC | PRN

## 2021-12-30 MED ORDER — CHLORHEXIDINE GLUCONATE 0.12 % MT SOLN
15.0000 mL | Freq: Once | OROMUCOSAL | Status: AC
  Administered 2021-12-30: 15 mL via OROMUCOSAL
  Filled 2021-12-30: qty 15

## 2021-12-30 MED ORDER — NITROGLYCERIN 0.4 MG SL SUBL: 0.4000 mg | SUBLINGUAL_TABLET | SUBLINGUAL | Status: DC | PRN

## 2021-12-30 MED ORDER — ROCURONIUM BROMIDE 10 MG/ML (PF) SYRINGE
PREFILLED_SYRINGE | INTRAVENOUS | Status: AC
  Filled 2021-12-30: qty 10

## 2021-12-30 MED ORDER — HEPARIN SODIUM (PORCINE) 5000 UNIT/ML IJ SOLN: 5000.0000 [IU] | Freq: Three times a day (TID) | INTRAMUSCULAR | Status: DC

## 2021-12-30 MED ORDER — PANTOPRAZOLE SODIUM 40 MG PO TBEC
40.0000 mg | DELAYED_RELEASE_TABLET | Freq: Every day | ORAL | Status: DC
  Administered 2021-12-31: 40 mg via ORAL
  Filled 2021-12-30: qty 1

## 2021-12-30 MED ORDER — CARVEDILOL 6.25 MG PO TABS
6.2500 mg | ORAL_TABLET | Freq: Two times a day (BID) | ORAL | Status: DC
  Administered 2021-12-30: 6.25 mg via ORAL
  Filled 2021-12-30 (×2): qty 1

## 2021-12-30 MED ORDER — PHENYLEPHRINE 40 MCG/ML (10ML) SYRINGE FOR IV PUSH (FOR BLOOD PRESSURE SUPPORT)
PREFILLED_SYRINGE | INTRAVENOUS | Status: AC
  Filled 2021-12-30: qty 10

## 2021-12-30 MED ORDER — DIPHENHYDRAMINE HCL 50 MG/ML IJ SOLN
INTRAMUSCULAR | Status: DC | PRN
  Administered 2021-12-30: 12.5 mg via INTRAVENOUS

## 2021-12-30 MED ORDER — ASPIRIN EC 81 MG PO TBEC
81.0000 mg | DELAYED_RELEASE_TABLET | Freq: Every day | ORAL | Status: DC
  Administered 2021-12-30 – 2021-12-31 (×2): 81 mg via ORAL
  Filled 2021-12-30 (×2): qty 1

## 2021-12-30 MED ORDER — ONDANSETRON HCL 4 MG/2ML IJ SOLN
INTRAMUSCULAR | Status: AC
  Filled 2021-12-30: qty 2

## 2021-12-30 MED ORDER — PROPOFOL 500 MG/50ML IV EMUL
INTRAVENOUS | Status: DC | PRN
  Administered 2021-12-30: 50 ug/kg/min via INTRAVENOUS

## 2021-12-30 MED ORDER — LACTATED RINGERS IV SOLN: INTRAVENOUS | Status: DC

## 2021-12-30 MED ORDER — MAGNESIUM SULFATE 2 GM/50ML IV SOLN: 2.0000 g | Freq: Every day | INTRAVENOUS | Status: DC | PRN

## 2021-12-30 MED ORDER — DEXAMETHASONE SODIUM PHOSPHATE 4 MG/ML IJ SOLN
INTRAMUSCULAR | Status: DC | PRN
  Administered 2021-12-30: 8 mg via INTRAVENOUS

## 2021-12-30 MED ORDER — ONDANSETRON HCL 4 MG/2ML IJ SOLN
INTRAMUSCULAR | Status: DC | PRN
  Administered 2021-12-30: 4 mg via INTRAVENOUS

## 2021-12-30 MED ORDER — VANCOMYCIN HCL IN DEXTROSE 1-5 GM/200ML-% IV SOLN
1000.0000 mg | INTRAVENOUS | Status: AC
  Administered 2021-12-30: 1000 mg via INTRAVENOUS
  Filled 2021-12-30: qty 200

## 2021-12-30 MED ORDER — PROTAMINE SULFATE 10 MG/ML IV SOLN
INTRAVENOUS | Status: DC | PRN
  Administered 2021-12-30: 50 mg via INTRAVENOUS

## 2021-12-30 MED ORDER — POTASSIUM CHLORIDE CRYS ER 20 MEQ PO TBCR: 20.0000 meq | EXTENDED_RELEASE_TABLET | Freq: Every day | ORAL | Status: DC | PRN

## 2021-12-30 MED ORDER — EPHEDRINE SULFATE-NACL 50-0.9 MG/10ML-% IV SOSY
PREFILLED_SYRINGE | INTRAVENOUS | Status: DC | PRN
  Administered 2021-12-30: 25 mg via INTRAVENOUS

## 2021-12-30 MED ORDER — PHENYLEPHRINE 40 MCG/ML (10ML) SYRINGE FOR IV PUSH (FOR BLOOD PRESSURE SUPPORT)
PREFILLED_SYRINGE | INTRAVENOUS | Status: DC | PRN
  Administered 2021-12-30 (×2): 80 ug via INTRAVENOUS

## 2021-12-30 MED ORDER — VANCOMYCIN HCL 500 MG/100ML IV SOLN
500.0000 mg | Freq: Once | INTRAVENOUS | Status: AC
  Administered 2021-12-30: 500 mg via INTRAVENOUS
  Filled 2021-12-30: qty 100

## 2021-12-30 MED ORDER — METOPROLOL TARTRATE 5 MG/5ML IV SOLN: 2.0000 mg | INTRAVENOUS | Status: DC | PRN

## 2021-12-30 MED ORDER — FENTANYL CITRATE (PF) 250 MCG/5ML IJ SOLN
INTRAMUSCULAR | Status: AC
  Filled 2021-12-30: qty 5

## 2021-12-30 MED ORDER — TERAZOSIN HCL 1 MG PO CAPS
1.0000 mg | ORAL_CAPSULE | Freq: Every day | ORAL | Status: DC
  Administered 2021-12-30: 1 mg via ORAL
  Filled 2021-12-30: qty 1

## 2021-12-30 MED ORDER — DOCUSATE SODIUM 100 MG PO CAPS
100.0000 mg | ORAL_CAPSULE | Freq: Every day | ORAL | Status: DC
  Filled 2021-12-30: qty 1

## 2021-12-30 MED ORDER — OXYCODONE HCL 5 MG PO TABS: 5.0000 mg | ORAL_TABLET | ORAL | Status: DC | PRN

## 2021-12-30 MED ORDER — MECLIZINE HCL 25 MG PO TABS
25.0000 mg | ORAL_TABLET | Freq: Three times a day (TID) | ORAL | Status: DC | PRN
  Filled 2021-12-30: qty 1

## 2021-12-30 MED ORDER — ATORVASTATIN CALCIUM 40 MG PO TABS
40.0000 mg | ORAL_TABLET | Freq: Every day | ORAL | Status: DC
  Administered 2021-12-30: 40 mg via ORAL
  Filled 2021-12-30: qty 1

## 2021-12-30 MED ORDER — LIDOCAINE 2% (20 MG/ML) 5 ML SYRINGE
INTRAMUSCULAR | Status: DC | PRN
  Administered 2021-12-30: 100 mg via INTRAVENOUS

## 2021-12-30 MED ORDER — PROMETHAZINE HCL 25 MG/ML IJ SOLN: 6.2500 mg | INTRAMUSCULAR | Status: DC | PRN

## 2021-12-30 MED ORDER — HYDRALAZINE HCL 10 MG PO TABS: 10.0000 mg | ORAL_TABLET | Freq: Three times a day (TID) | ORAL | Status: DC | PRN

## 2021-12-30 MED ORDER — SUGAMMADEX SODIUM 200 MG/2ML IV SOLN
INTRAVENOUS | Status: DC | PRN
  Administered 2021-12-30: 388.4 mg via INTRAVENOUS

## 2021-12-30 MED ORDER — MAGNESIUM OXIDE -MG SUPPLEMENT 400 (240 MG) MG PO TABS
800.0000 mg | ORAL_TABLET | Freq: Two times a day (BID) | ORAL | Status: DC
  Administered 2021-12-30 – 2021-12-31 (×2): 800 mg via ORAL
  Filled 2021-12-30 (×3): qty 2

## 2021-12-30 MED ORDER — ALBUMIN HUMAN 5 % IV SOLN
INTRAVENOUS | Status: AC
  Filled 2021-12-30: qty 250

## 2021-12-30 MED ORDER — HEMOSTATIC AGENTS (NO CHARGE) OPTIME
TOPICAL | Status: DC | PRN
Start: 2021-12-30 — End: 2021-12-30
  Administered 2021-12-30: 1 via TOPICAL

## 2021-12-30 MED ORDER — ROCURONIUM BROMIDE 100 MG/10ML IV SOLN
INTRAVENOUS | Status: DC | PRN
  Administered 2021-12-30: 100 mg via INTRAVENOUS
  Administered 2021-12-30: 30 mg via INTRAVENOUS

## 2021-12-30 MED ORDER — DEXTRAN 40 IN SALINE 10-0.9 % IV SOLN
INTRAVENOUS | Status: AC | PRN
  Administered 2021-12-30: 500 mL

## 2021-12-30 MED ORDER — AMISULPRIDE (ANTIEMETIC) 5 MG/2ML IV SOLN: 10.0000 mg | Freq: Once | INTRAVENOUS | Status: DC | PRN

## 2021-12-30 MED ORDER — 0.9 % SODIUM CHLORIDE (POUR BTL) OPTIME
TOPICAL | Status: DC | PRN
  Administered 2021-12-30 (×2): 1000 mL

## 2021-12-30 MED ORDER — DEXAMETHASONE SODIUM PHOSPHATE 10 MG/ML IJ SOLN
INTRAMUSCULAR | Status: AC
  Filled 2021-12-30: qty 1

## 2021-12-30 MED ORDER — BISACODYL 5 MG PO TBEC: 5.0000 mg | DELAYED_RELEASE_TABLET | Freq: Every day | ORAL | Status: DC | PRN

## 2021-12-30 MED ORDER — FENTANYL CITRATE (PF) 100 MCG/2ML IJ SOLN: 25.0000 ug | INTRAMUSCULAR | Status: DC | PRN

## 2021-12-30 MED ORDER — LIDOCAINE HCL (PF) 1 % IJ SOLN
INTRAMUSCULAR | Status: AC
  Filled 2021-12-30: qty 30

## 2021-12-30 MED ORDER — ACETAMINOPHEN 500 MG PO TABS
1000.0000 mg | ORAL_TABLET | Freq: Once | ORAL | Status: AC
  Administered 2021-12-30: 1000 mg via ORAL
  Filled 2021-12-30: qty 2

## 2021-12-30 MED ORDER — PANTOPRAZOLE SODIUM 40 MG PO TBEC: 40.0000 mg | DELAYED_RELEASE_TABLET | Freq: Every day | ORAL | Status: DC

## 2021-12-30 MED ORDER — FENTANYL CITRATE (PF) 250 MCG/5ML IJ SOLN
INTRAMUSCULAR | Status: DC | PRN
Start: 2021-12-30 — End: 2021-12-30
  Administered 2021-12-30: 25 ug via INTRAVENOUS
  Administered 2021-12-30: 100 ug via INTRAVENOUS

## 2021-12-30 MED ORDER — PROPOFOL 10 MG/ML IV BOLUS
INTRAVENOUS | Status: AC
  Filled 2021-12-30: qty 20

## 2021-12-30 MED ORDER — SUGAMMADEX SODIUM 500 MG/5ML IV SOLN
INTRAVENOUS | Status: AC
  Filled 2021-12-30: qty 5

## 2021-12-30 MED ORDER — PROPOFOL 10 MG/ML IV BOLUS
INTRAVENOUS | Status: DC | PRN
  Administered 2021-12-30: 150 mg via INTRAVENOUS

## 2021-12-30 MED ORDER — ONDANSETRON HCL 4 MG/2ML IJ SOLN: 4.0000 mg | Freq: Four times a day (QID) | INTRAMUSCULAR | Status: DC | PRN

## 2021-12-30 MED ORDER — ACETAMINOPHEN 325 MG PO TABS: 325.0000 mg | ORAL_TABLET | ORAL | Status: DC | PRN

## 2021-12-30 MED ORDER — HEPARIN 6000 UNIT IRRIGATION SOLUTION
Status: AC
  Filled 2021-12-30: qty 500

## 2021-12-30 MED ORDER — GUAIFENESIN-DM 100-10 MG/5ML PO SYRP: 15.0000 mL | ORAL_SOLUTION | ORAL | Status: DC | PRN

## 2021-12-30 MED ORDER — HEPARIN SODIUM (PORCINE) 1000 UNIT/ML IJ SOLN
INTRAMUSCULAR | Status: DC | PRN
  Administered 2021-12-30: 10000 [IU] via INTRAVENOUS

## 2021-12-30 MED ORDER — SODIUM CHLORIDE 0.9 % IV SOLN: 500.0000 mL | Freq: Once | INTRAVENOUS | Status: DC | PRN

## 2021-12-30 MED ORDER — LIDOCAINE-EPINEPHRINE (PF) 1 %-1:200000 IJ SOLN
INTRAMUSCULAR | Status: AC
  Filled 2021-12-30: qty 30

## 2021-12-30 MED ORDER — HYDROMORPHONE HCL 1 MG/ML IJ SOLN: 0.5000 mg | INTRAMUSCULAR | Status: DC | PRN

## 2021-12-30 MED ORDER — PHENYLEPHRINE HCL-NACL 20-0.9 MG/250ML-% IV SOLN
INTRAVENOUS | Status: DC | PRN
  Administered 2021-12-30: 50 ug/min via INTRAVENOUS
  Administered 2021-12-30: 30 ug/min via INTRAVENOUS

## 2021-12-30 MED ORDER — APIXABAN 2.5 MG PO TABS
2.5000 mg | ORAL_TABLET | Freq: Two times a day (BID) | ORAL | Status: DC
  Administered 2021-12-31: 2.5 mg via ORAL
  Filled 2021-12-30: qty 1

## 2021-12-30 MED ORDER — LACTATED RINGERS IV SOLN
INTRAVENOUS | Status: DC | PRN
Start: 2021-12-30 — End: 2021-12-30

## 2021-12-30 MED ORDER — LABETALOL HCL 5 MG/ML IV SOLN: 10.0000 mg | INTRAVENOUS | Status: DC | PRN

## 2021-12-30 SURGICAL SUPPLY — 47 items
ADH SKN CLS APL DERMABOND .7 (GAUZE/BANDAGES/DRESSINGS) ×1
AGENT HMST KT MTR STRL THRMB (HEMOSTASIS) ×1
BAG COUNTER SPONGE SURGICOUNT (BAG) ×2 IMPLANT
BAG DECANTER FOR FLEXI CONT (MISCELLANEOUS) ×2 IMPLANT
BAG SPNG CNTER NS LX DISP (BAG) ×1
CANISTER SUCT 3000ML PPV (MISCELLANEOUS) ×2 IMPLANT
CANNULA VESSEL 3MM 2 BLNT TIP (CANNULA) ×6 IMPLANT
CATH ROBINSON RED A/P 18FR (CATHETERS) ×2 IMPLANT
CLIP VESOCCLUDE MED 24/CT (CLIP) ×2 IMPLANT
CLIP VESOCCLUDE SM WIDE 24/CT (CLIP) ×2 IMPLANT
DERMABOND ADVANCED (GAUZE/BANDAGES/DRESSINGS) ×1
DERMABOND ADVANCED .7 DNX12 (GAUZE/BANDAGES/DRESSINGS) ×1 IMPLANT
DRAIN CHANNEL 15F RND FF W/TCR (WOUND CARE) IMPLANT
ELECT REM PT RETURN 9FT ADLT (ELECTROSURGICAL) ×2
ELECTRODE REM PT RTRN 9FT ADLT (ELECTROSURGICAL) ×1 IMPLANT
EVACUATOR SILICONE 100CC (DRAIN) IMPLANT
GLOVE SRG 8 PF TXTR STRL LF DI (GLOVE) ×1 IMPLANT
GLOVE SURG ENC MOIS LTX SZ7.5 (GLOVE) ×2 IMPLANT
GLOVE SURG POLY ORTHO LF SZ7.5 (GLOVE) IMPLANT
GLOVE SURG UNDER LTX SZ8 (GLOVE) ×2 IMPLANT
GLOVE SURG UNDER POLY LF SZ8 (GLOVE) ×2
GOWN STRL REUS W/ TWL LRG LVL3 (GOWN DISPOSABLE) ×3 IMPLANT
GOWN STRL REUS W/TWL LRG LVL3 (GOWN DISPOSABLE) ×10
GRAFT VASC PATCH XENOSURE 1X14 (Vascular Products) ×1 IMPLANT
KIT BASIN OR (CUSTOM PROCEDURE TRAY) ×2 IMPLANT
KIT SHUNT ARGYLE CAROTID ART 6 (VASCULAR PRODUCTS) ×1 IMPLANT
KIT TURNOVER KIT B (KITS) ×2 IMPLANT
NDL HYPO 25GX1X1/2 BEV (NEEDLE) ×1 IMPLANT
NEEDLE HYPO 25GX1X1/2 BEV (NEEDLE) ×2 IMPLANT
NS IRRIG 1000ML POUR BTL (IV SOLUTION) ×4 IMPLANT
PACK CAROTID (CUSTOM PROCEDURE TRAY) ×2 IMPLANT
PAD ARMBOARD 7.5X6 YLW CONV (MISCELLANEOUS) ×4 IMPLANT
POSITIONER HEAD DONUT 9IN (MISCELLANEOUS) ×2 IMPLANT
SET WALTER ACTIVATION W/DRAPE (SET/KITS/TRAYS/PACK) IMPLANT
SHUNT CAROTID BYPASS 10 (VASCULAR PRODUCTS) IMPLANT
SHUNT CAROTID BYPASS 12 (VASCULAR PRODUCTS) IMPLANT
SPONGE SURGIFOAM ABS GEL 100 (HEMOSTASIS) IMPLANT
SURGIFLO W/THROMBIN 8M KIT (HEMOSTASIS) ×1 IMPLANT
SUT MNCRL AB 4-0 PS2 18 (SUTURE) ×2 IMPLANT
SUT PROLENE 6 0 BV (SUTURE) ×4 IMPLANT
SUT SILK 2 0 PERMA HAND 18 BK (SUTURE) IMPLANT
SUT VIC AB 3-0 SH 27 (SUTURE) ×2
SUT VIC AB 3-0 SH 27X BRD (SUTURE) ×1 IMPLANT
SYR 20ML LL LF (SYRINGE) ×2 IMPLANT
SYR CONTROL 10ML LL (SYRINGE) ×2 IMPLANT
TOWEL GREEN STERILE (TOWEL DISPOSABLE) ×2 IMPLANT
WATER STERILE IRR 1000ML POUR (IV SOLUTION) ×2 IMPLANT

## 2021-12-30 NOTE — Progress Notes (Signed)
Patient displayed equal bilateral strength in hands and feet, tongue midline; in pre op.

## 2021-12-30 NOTE — Progress Notes (Addendum)
°  Progress Note    12/30/2021 1:48 PM Day of Surgery  Subjective:  throat is a little sore, no other complaints   Vitals:   12/30/21 1221 12/30/21 1300  BP: 123/66 107/70  Pulse: 69 70  Resp:  18  Temp: (!) 97.5 F (36.4 C)   SpO2: 100% 100%   Physical Exam: Cardiac: regular Lungs: non labored Incisions: left neck incision is c/d/I, no swelling or hematoma Extremities: moving all extremities without deficits Neurologic: alert and oriented, speech mildly hoarse, coherent. Smile symmetric  CBC    Component Value Date/Time   WBC 4.8 12/30/2021 1020   RBC 3.52 (L) 12/30/2021 1020   HGB 11.4 (L) 12/30/2021 1020   HGB 14.8 12/08/2021 0910   HCT 35.3 (L) 12/30/2021 1020   HCT 43.5 12/08/2021 0910   PLT 85 (L) 12/30/2021 1020   PLT 118 (L) 12/08/2021 0910   MCV 100.3 (H) 12/30/2021 1020   MCV 97 12/08/2021 0910   MCH 32.4 12/30/2021 1020   MCHC 32.3 12/30/2021 1020   RDW 12.7 12/30/2021 1020   RDW 12.3 12/08/2021 0910   LYMPHSABS 1.6 10/15/2021 1357   LYMPHSABS 1.1 05/16/2020 1130   MONOABS 0.5 10/15/2021 1357   EOSABS 0.6 10/15/2021 1357   EOSABS 0.5 (H) 05/16/2020 1130   BASOSABS 0.1 10/15/2021 1357   BASOSABS 0.1 05/16/2020 1130    BMET    Component Value Date/Time   NA 140 12/26/2021 1130   NA 144 12/15/2021 1101   K 4.4 12/26/2021 1130   CL 105 12/26/2021 1130   CO2 29 12/26/2021 1130   GLUCOSE 126 (H) 12/26/2021 1130   BUN 26 (H) 12/26/2021 1130   BUN 27 12/15/2021 1101   CREATININE 1.67 (H) 12/30/2021 1020   CREATININE 1.45 (H) 05/22/2016 0938   CALCIUM 8.9 12/26/2021 1130   GFRNONAA 41 (L) 12/30/2021 1020   GFRNONAA 46 (L) 05/22/2016 0938   GFRAA 32 (L) 05/16/2020 1130   GFRAA 54 (L) 05/22/2016 0938    INR    Component Value Date/Time   INR 1.2 12/26/2021 1130     Intake/Output Summary (Last 24 hours) at 12/30/2021 1348 Last data filed at 12/30/2021 0954 Gross per 24 hour  Intake 1400 ml  Output 75 ml  Net 1325 ml      Assessment/Plan:  81 y.o. male is s/p Left CEA Day of Surgery   Doing well post op Neurologically intact Left neck incision is c/d/I without swelling or hematoma Hemodynamically stable Pain well controlled Anticipate d/c tomorrow if he continues to do well overnight  Karoline Caldwell, PA-C [facility] [phone] 12/30/2021 1:48 PM  I have interviewed the patient and examined the patient. I agree with the findings by the PA. Restart Eliquis tomorrow.   Gae Gallop, MD

## 2021-12-30 NOTE — Anesthesia Procedure Notes (Signed)
Arterial Line Insertion Start/End1/31/2023 7:00 AM Performed by: Minerva Ends, CRNA  Patient location: Pre-op. Preanesthetic checklist: patient identified, IV checked, site marked, risks and benefits discussed, surgical consent, monitors and equipment checked, pre-op evaluation, timeout performed and anesthesia consent Lidocaine 1% used for infiltration Right, radial was placed Catheter size: 20 G Hand hygiene performed , maximum sterile barriers used  and Seldinger technique used  Attempts: 1 Procedure performed without using ultrasound guided technique. Following insertion, dressing applied and Biopatch. Post procedure assessment: normal  Patient tolerated the procedure well with no immediate complications. Additional procedure comments: Placed by S. Buerk, SRNA.

## 2021-12-30 NOTE — Transfer of Care (Signed)
Immediate Anesthesia Transfer of Care Note  Patient: Thomas Marshall  Procedure(s) Performed: LEFT CAROTID ENDARTERECTOMY (Left)  Patient Location: PACU  Anesthesia Type:General  Level of Consciousness: awake, alert  and oriented  Airway & Oxygen Therapy: Patient Spontanous Breathing and Patient connected to face mask oxygen  Post-op Assessment: Report given to RN, Post -op Vital signs reviewed and stable and Patient moving all extremities X 4  Post vital signs: Reviewed and stable  Last Vitals:  Vitals Value Taken Time  BP 93/53 12/30/21 1003  Temp    Pulse 46 12/30/21 1010  Resp 8 12/30/21 1010  SpO2 99 % 12/30/21 1010  Vitals shown include unvalidated device data.  Last Pain:  Vitals:   12/30/21 0606  TempSrc:   PainSc: 0-No pain         Complications: No notable events documented.

## 2021-12-30 NOTE — Anesthesia Postprocedure Evaluation (Signed)
Anesthesia Post Note  Patient: Thomas Marshall  Procedure(s) Performed: LEFT CAROTID ENDARTERECTOMY (Left)     Patient location during evaluation: PACU Anesthesia Type: General Level of consciousness: sedated Pain management: pain level controlled Vital Signs Assessment: post-procedure vital signs reviewed and stable Respiratory status: spontaneous breathing and respiratory function stable Cardiovascular status: stable Postop Assessment: no apparent nausea or vomiting Anesthetic complications: no   No notable events documented.  Last Vitals:  Vitals:   12/30/21 1130 12/30/21 1145  BP: 113/64 112/88  Pulse: 61 72  Resp: 14 16  Temp:    SpO2: 96% 99%    Last Pain:  Vitals:   12/30/21 1130  TempSrc:   PainSc: 0-No pain                 Diamone Whistler DANIEL

## 2021-12-30 NOTE — Progress Notes (Signed)
Patient displayed equal bilateral strength in hands and feet postop.

## 2021-12-30 NOTE — Interval H&P Note (Signed)
History and Physical Interval Note:  12/30/2021 7:18 AM  Thomas Marshall  has presented today for surgery, with the diagnosis of Bilateral carotid artery stenosis.  The various methods of treatment have been discussed with the patient and family. After consideration of risks, benefits and other options for treatment, the patient has consented to  Procedure(s): LEFT CAROTID ENDARTERECTOMY (Left) as a surgical intervention.  The patient's history has been reviewed, patient examined, no change in status, stable for surgery.  I have reviewed the patient's chart and labs.  Questions were answered to the patient's satisfaction.     Deitra Mayo

## 2021-12-30 NOTE — Progress Notes (Signed)
PHARMACY NOTE:  ANTIMICROBIAL RENAL DOSAGE ADJUSTMENT  Current antimicrobial regimen includes a mismatch between antimicrobial dosage and estimated renal function.  As per policy approved by the Pharmacy & Therapeutics and Medical Executive Committees, the antimicrobial dosage will be adjusted accordingly.  Current antimicrobial dosage:  vancomycin 750mg  IV x2 doses  Indication: Surgical prophylaxis  Renal Function:  Estimated Creatinine Clearance: 39.7 mL/min (A) (by C-G formula based on SCr of 1.67 mg/dL (H)). []      On intermittent HD, scheduled: []      On CRRT    Antimicrobial dosage has been changed to:  Vancomycin 500mg  IV x1   Additional comments: -He received 1000mg  IV vancomycin at ~ 8am. Will give him an additional 500mg  due to a weight of 97kg.  After a total dose of 1500mg , he will not need any further doses   Thank you for allowing pharmacy to be a part of this patient's care.  Hildred Laser, PharmD Clinical Pharmacist **Pharmacist phone directory can now be found on [email or web address] ([email or web address]).  Listed under [place].

## 2021-12-30 NOTE — Anesthesia Procedure Notes (Signed)
Procedure Name: Intubation Date/Time: 12/30/2021 7:45 AM Performed by: Minerva Ends, CRNA Pre-anesthesia Checklist: Patient identified, Emergency Drugs available, Suction available and Patient being monitored Patient Re-evaluated:Patient Re-evaluated prior to induction Oxygen Delivery Method: Circle system utilized Preoxygenation: Pre-oxygenation with 100% oxygen Induction Type: IV induction Ventilation: Mask ventilation without difficulty and Oral airway inserted - appropriate to patient size Laryngoscope Size: Sabra Heck and 2 Grade View: Grade I Tube type: Oral Tube size: 7.5 mm Number of attempts: 1 Airway Equipment and Method: Stylet and Oral airway Placement Confirmation: ETT inserted through vocal cords under direct vision, positive ETCO2 and breath sounds checked- equal and bilateral Secured at: 24 cm Tube secured with: Tape Dental Injury: Teeth and Oropharynx as per pre-operative assessment  Comments: Performed by S. Buerk, SRNA

## 2021-12-30 NOTE — Op Note (Signed)
° ° °  NAME: Thomas Marshall    MRN: [id number] DOB: Apr 10, 1941    DATE OF OPERATION: 12/30/2021  PREOP DIAGNOSIS:    ASYMPTOMATIC GREATER THAN 80% LEFT CAROTID STENOSIS  POSTOP DIAGNOSIS:    Same  PROCEDURE:    LEFT CAROTID ENDARTERECTOMY WITH DACRON PATCH ANGIOPLASTY  SURGEON: Judeth Cornfield. Scot Dock, MD  ASSIST: Dr. Curt Jews, Laurence Slate, PA  ANESTHESIA: General  EBL: 75 cc  INDICATIONS:    Thomas Marshall is a 81 y.o. male who had undergone a previous right carotid endarterectomy by Dr. Adele Barthel.  He was being followed with a moderate left carotid stenosis.  This progressed to greater than 80%.  He was asymptomatic.  He underwent preoperative cardiac evaluation.  Of note the right carotid was widely patent.  FINDINGS:   Bulky calcified plaque in the proximal ICA producing a 90% stenosis.  TECHNIQUE:   The patient was taken to the operating room and received a general anesthetic.  The left neck was prepped and draped in usual sterile fashion.  An incision was made along the anterior border of the sternocleidomastoid and the dissection carried down to the common carotid artery which was dissected free and controlled with Rummel tourniquet.  The patient was heparinized.  ACT was monitored throughout the procedure.  The facial vein was divided between 2-0 silk ties.  This allowed exposure of the internal carotid artery above the plaque.  The superior thyroid artery and external carotid arteries were controlled.  Next clamp was then placed on the internal and the common and the external carotid artery.  A longitudinal arteriotomy was made in the common carotid artery.  This was extended through the plaque into the internal carotid artery.  A 12 shunt was placed into the internal carotid artery backbled and then placed into the common carotid artery and secured with Rummel tourniquet.  Flow was reestablished to the shunt and was checked with the Doppler.  An endarterectomy plane was  established proximally and the plaque was sharply divided.  Eversion endarterectomy was performed of the external carotid artery.  Distally was a nice tapering the plaque and no tacking sutures were required.  The artery was irrigated with copious amounts of heparin and dextran and all loose debris removed.  A long bovine pericardial patch was then sewn using continuous 6-0 Prolene suture.  Prior to completing the patch closure the shunt was removed.  The arteries were backbled and flushed appropriately and the anastomosis completed.  Flow was reestablished first to the external carotid artery and into the internal carotid artery.  At the completion there was a good pulse distal to the patch and a good Doppler signal with diastolic flow.  Hemostasis was obtained in the wound and the heparin was partially reversed with protamine.  The wound was then closed with a deep layer of 3-0 Vicryl.  The platysma was closed with running 3-0 Vicryl.  The skin was closed with 4-0 Monocryl.  Dermabond was applied.  The patient tolerated procedure well was transferred to recovery room in stable condition.  All needle and sponge counts were correct.  Given the complexity of the case a first assistant was necessary in order to expedient the procedure and safely perform the technical aspects of the operation.  Deitra Mayo, MD, FACS [facility]  DATE OF DICTATION:   12/30/2021

## 2021-12-31 ENCOUNTER — Ambulatory Visit (HOSPITAL_BASED_OUTPATIENT_CLINIC_OR_DEPARTMENT_OTHER): Payer: Medicare Other | Admitting: Family

## 2021-12-31 ENCOUNTER — Encounter (HOSPITAL_COMMUNITY): Payer: Self-pay | Admitting: Vascular Surgery

## 2021-12-31 DIAGNOSIS — Z48812 Encounter for surgical aftercare following surgery on the circulatory system: Secondary | ICD-10-CM

## 2021-12-31 LAB — LIPID PANEL
Cholesterol: 103 mg/dL (ref 0–200)
HDL: 37 mg/dL — ABNORMAL LOW (ref >40)
LDL Cholesterol: 54 mg/dL (ref 0–99)
Total CHOL/HDL Ratio: 2.8 RATIO
Triglycerides: 59 mg/dL (ref <150)
VLDL: 12 mg/dL (ref 0–40)

## 2021-12-31 LAB — CBC
HCT: 33 % — ABNORMAL LOW (ref 39.0–52.0)
Hemoglobin: 11.1 g/dL — ABNORMAL LOW (ref 13.0–17.0)
MCH: 33.1 pg (ref 26.0–34.0)
MCHC: 33.6 g/dL (ref 30.0–36.0)
MCV: 98.5 fL (ref 80.0–100.0)
Platelets: 84 10*3/uL — ABNORMAL LOW (ref 150–400)
RBC: 3.35 MIL/uL — ABNORMAL LOW (ref 4.22–5.81)
RDW: 12.6 % (ref 11.5–15.5)
WBC: 7.1 10*3/uL (ref 4.0–10.5)
nRBC: 0 % (ref 0.0–0.2)

## 2021-12-31 LAB — BASIC METABOLIC PANEL
Anion gap: 6 (ref 5–15)
BUN: 37 mg/dL — ABNORMAL HIGH (ref 8–23)
CO2: 24 mmol/L (ref 22–32)
Calcium: 8.2 mg/dL — ABNORMAL LOW (ref 8.9–10.3)
Chloride: 108 mmol/L (ref 98–111)
Creatinine, Ser: 1.99 mg/dL — ABNORMAL HIGH (ref 0.61–1.24)
GFR, Estimated: 33 mL/min — ABNORMAL LOW (ref >60)
Glucose, Bld: 144 mg/dL — ABNORMAL HIGH (ref 70–99)
Potassium: 4.6 mmol/L (ref 3.5–5.1)
Sodium: 138 mmol/L (ref 135–145)

## 2021-12-31 MED ORDER — HYDROCODONE-ACETAMINOPHEN 5-325 MG PO TABS: 1.0000 | ORAL_TABLET | Freq: Four times a day (QID) | ORAL | 0 refills | Status: DC | PRN

## 2021-12-31 NOTE — Progress Notes (Signed)
° °  VASCULAR SURGERY ASSESSMENT & PLAN:   POD 1 LEFT CAROTID ENDARTERECTOMY: The patient is doing well status post left CEA.  He is ready for discharge today.  VASCULAR QUALITY INITIATIVE: He is on aspirin and a statin.  HISTORY OF ATRIAL FIBRILLATION: He can resume his Eliquis today.   SUBJECTIVE:   No complaints this morning.  PHYSICAL EXAM:   Vitals:   12/30/21 2200 12/31/21 0000 12/31/21 0200 12/31/21 0400  BP:  (!) 102/51  (!) 109/54  Pulse: 60 72  68  Resp: 20 18 18 18   Temp: 98 F (36.7 C) 98 F (36.7 C)  97.9 F (36.6 C)  TempSrc:  Oral  Oral  SpO2:  96%  93%  Weight:      Height:       His left neck incision looks fine. Neuro intact.  LABS:   Lab Results  Component Value Date   WBC 7.1 12/31/2021   HGB 11.1 (L) 12/31/2021   HCT 33.0 (L) 12/31/2021   MCV 98.5 12/31/2021   PLT 84 (L) 12/31/2021   Lab Results  Component Value Date   CREATININE 1.99 (H) 12/31/2021   Lab Results  Component Value Date   INR 1.2 12/26/2021   CBG (last 3)  Recent Labs    12/30/21 0547 12/30/21 1005  GLUCAP 132* 129*    PROBLEM LIST:    Principal Problem:   Carotid stenosis, asymptomatic   CURRENT MEDS:    apixaban  2.5 mg Oral BID   aspirin EC  81 mg Oral Daily   atorvastatin  40 mg Oral QHS   carvedilol  6.25 mg Oral BID   docusate sodium  100 mg Oral Daily   magnesium oxide  800 mg Oral BID   pantoprazole  40 mg Oral Daily   terazosin  1 mg Oral QHS    Deitra Mayo Office: [phone] 12/31/2021

## 2021-12-31 NOTE — Progress Notes (Signed)
Patient ambulated to bathroom and back to bed. Tolerated well.

## 2021-12-31 NOTE — Progress Notes (Signed)
°  Transition of Care (TOC) Screening Note   Patient Details  Name: Thomas Marshall Date of Birth: 1941-05-16   Transition of Care [facility]) CM/SW Contact:    Dawayne Patricia, RN Phone Number: 12/31/2021, 10:22 AM    Transition of Care Department [facility]) has reviewed patient and no TOC needs have been identified at this time. Pt stable for transition home today.

## 2021-12-31 NOTE — Addendum Note (Signed)
Addendum  created 12/31/21 1345 by Minerva Ends, CRNA   Charge Capture section accepted

## 2021-12-31 NOTE — Discharge Summary (Signed)
Discharge Summary     Thomas Marshall 07-21-1941 81 y.o. male  [id number]  Admission Date: 12/30/2021  Discharge Date: 12/31/2021  Physician: Angelia Mould, *  Admission Diagnosis: Carotid stenosis, asymptomatic [I65.29]   HPI:   This is a 81 y.o. male who was seen by Karoline Caldwell, [place] on 11/20/2021.  The patient is following with carotid disease and a small abdominal aortic aneurysm.  The patient underwent right carotid endarterectomy in 2017 by Dr. Adele Barthel.  He was being followed with a moderate left carotid stenosis.  This had progressed to greater than 80%.  A CT angiogram was recommended and he was set up for a visit with me for further recommendations.   Of note he is on aspirin, a statin, and also Eliquis.  He has stage III chronic kidney disease.   The patient is right-handed.  He denies any history of stroke, TIAs, expressive or receptive aphasia, or amaurosis fugax.  He does admit to some occasional dizziness.  He also admits to some general fatigue.  He denies chest pain but has had some dyspnea on exertion.  Hospital Course:  The patient was admitted to the hospital and taken to the operating room on 12/30/2021 and underwent left CEA with dacron patch angioplasty    Findings: Bulky calcified plaque in the proximal ICA producing a 90% stenosis  The pt tolerated the procedure well and was transported to the PACU in good condition.   By POD 1, the pt neuro status was intact.  He was doing well and discharged home.    Recent Labs    12/31/21 0500  NA 138  K 4.6  CL 108  CO2 24  GLUCOSE 144*  BUN 37*  CALCIUM 8.2*   Recent Labs    12/30/21 1020 12/31/21 0500  WBC 4.8 7.1  HGB 11.4* 11.1*  HCT 35.3* 33.0*  PLT 85* 84*   No results for input(s): INR in the last 72 hours.   Discharge Instructions     Discharge patient   Complete by: As directed    Discharge after pt has eaten, walked and voided   Discharge disposition: 01-Home or Self Care    Discharge patient date: 12/31/2021       Discharge Diagnosis:  Carotid stenosis, asymptomatic [I65.29]  Secondary Diagnosis: Patient Active Problem List   Diagnosis Date Noted   Carotid stenosis, asymptomatic 12/30/2021   Fatigue 10/15/2021   Pacemaker 01/01/2021   Chronic diastolic heart failure ([place]) 01/01/2021   Aortic atherosclerosis ([place]) 10/16/2020   Dyspnea 09/08/2020   Anemia 09/08/2020   Pain due to onychomycosis of toenails of both feet 05/01/2020   Sinus node dysfunction ([place]) 03/14/2020   Secondary hypercoagulable state ([place]) 01/25/2020   Hypocalcemia 01/09/2020   Spinal stenosis in cervical region 01/09/2020   Acute bursitis of right shoulder 12/20/2019   Subconjunctival hemorrhage 12/01/2019   TIA (transient ischemic attack) 11/20/2019   Pancytopenia ([place]) 11/20/2019   Left sided numbness    Coagulation disorder ([place]) 10/31/2019   Arthritis of right sacroiliac joint 07/21/2019   Right lower quadrant abdominal swelling 10/12/2018   Cough 12/09/2017   PVCs (premature ventricular contractions) 10/27/2017   PVC's (premature ventricular contractions) 09/05/2017   Bradycardia 09/04/2017   Cellulitis of right knee 08/11/2017   Degenerative disc disease, lumbar 07/26/2017   Atypical angina ([place])    Right low back pain 05/18/2017   Rash 04/08/2017   Paroxysmal atrial fibrillation ([place]) 09/27/2016   Persistent atrial fibrillation ([place])  Erectile dysfunction 09/16/2016   Hypomagnesemia 05/22/2016   Essential hypertension 04/21/2016   CAD (coronary artery disease) 03/02/2016   Neck abscess 01/10/2016   Carotid artery disease without cerebral infarction ([place]) 11/15/2015   Right facial swelling 08/06/2015   Benign prostatic hyperplasia 08/06/2015   Abscess of left thigh 07/16/2015   Cellulitis and abscess of leg 07/09/2015   CKD (chronic kidney disease) stage 3, GFR 30-59 ml/min (HCC) 05/14/2015   Carotid stenosis 05/01/2015   PVD (peripheral vascular disease)  ([place]) 04/09/2015   AAA (abdominal aortic aneurysm) 04/09/2015   S/P CABG x 4 2004 09/10/2014   Long term current use of anticoagulant therapy 09/04/2013   Hx of squamous cell carcinoma of skin    Type 2 diabetes mellitus with renal manifestations (HCC)    GERD (gastroesophageal reflux disease)    Hyperlipidemia    Past Medical History:  Diagnosis Date   AAA (abdominal aortic aneurysm)    Arthritis    Cancer ([place])    skin & squamous cell   Carotid artery disease ([place])    a. s/p R CEA 12/2015.   CKD (chronic kidney disease), stage III ([place])    a. per historical labs.   Coronary artery disease    a. s/p CABG 2004.   Diabetes mellitus without complication (HCC)    GERD (gastroesophageal reflux disease)    Hyperlipidemia    Hypertension    Medication intolerance Multiple    PAF (paroxysmal atrial fibrillation) (HCC)    Pneumonia    Presence of permanent cardiac pacemaker    Sinus bradycardia    a. baseline HR 50s-60s, also h/o bradycardia on metoprolol and carvedilol    Allergies as of 12/31/2021       Reactions   Lisinopril Swelling, Other (See Comments)   Angioedema.    Losartan Swelling, Other (See Comments)   Lips swell Angioedema   Nifedipine Swelling, Other (See Comments)   Sugar increase   Triamterene Swelling, Other (See Comments)   Face swells, no breathing impairment   Hydralazine Hcl Other (See Comments)   Fatigue; poor appetite   Losartan Potassium-hctz Swelling, Other (See Comments)   Amoxicillin Rash, Other (See Comments)   Did it involve swelling of the face/tongue/throat, SOB, or low BP? No Did it involve sudden or severe rash/hives, skin peeling, or any reaction on the inside of your mouth or nose? No Did you need to seek medical attention at a hospital or doctor's office? No When did it last happen?      10 + years If all above answers are NO, may proceed with cephalosporin use.   Ampicillin Rash, Other (See Comments)   Did it involve swelling of the  face/tongue/throat, SOB, or low BP? No Did it involve sudden or severe rash/hives, skin peeling, or any reaction on the inside of your mouth or nose? No Did you need to seek medical attention at a hospital or doctor's office? No When did it last happen?      10 + years If all above answers are "NO", may proceed with cephalosporin use.   Carvedilol Other (See Comments)   Low heart rate (in higher doses)        Medication List     TAKE these medications    glucose blood test strip Accu-Check Guide Strips. Dx E11.9 Use to test blood glucose level 1 time a day.   Accu-Chek Guide test strip Generic drug: glucose blood USE 1 NEW TEST STRIP ONCE DAILY TO TEST BLOOD SUGAR  Accu-Chek Softclix Lancets lancets USE 1 TWICE DAILY USE AS DIRECTED E11.9   apixaban 2.5 MG Tabs tablet Commonly known as: Eliquis Take 1 tablet (2.5 mg total) by mouth 2 (two) times daily. (0800 & 2000)   aspirin EC 81 MG tablet Take 1 tablet (81 mg total) by mouth daily.   atorvastatin 40 MG tablet Commonly known as: LIPITOR Take 40 mg by mouth at bedtime.   carvedilol 6.25 MG tablet Commonly known as: COREG Take 1 tablet (6.25 mg total) by mouth 2 (two) times daily.   cholecalciferol 25 MCG (1000 UNIT) tablet Commonly known as: VITAMIN D3 Take 1,000 Units by mouth every evening.   hydrALAZINE 10 MG tablet Commonly known as: APRESOLINE Take 10 mg by mouth 3 (three) times daily as needed (elevated bp greater than 160).   HYDROcodone-acetaminophen 5-325 MG tablet Commonly known as: Norco Take 1 tablet by mouth every 6 (six) hours as needed for moderate pain.   magnesium oxide 400 MG tablet Commonly known as: MAG-OX Take 800 mg by mouth 2 (two) times daily.   meclizine 25 MG tablet Commonly known as: ANTIVERT Take 1 tablet (25 mg total) by mouth 3 (three) times daily as needed for dizziness.   nitroGLYCERIN 0.4 MG SL tablet Commonly known as: NITROSTAT Place 1 tablet (0.4 mg total) under the  tongue every 5 (five) minutes as needed for chest pain (x 3 doses).   pantoprazole 40 MG tablet Commonly known as: Protonix Take 1 tablet (40 mg total) by mouth daily.   terazosin 1 MG capsule Commonly known as: HYTRIN Take 1 mg by mouth at bedtime.   vitamin C 500 MG tablet Commonly known as: ASCORBIC ACID Take 500 mg by mouth daily with breakfast.   Zinc 22.5 MG Tabs Take 22.5 mg by mouth 2 (two) times a week.         [facility] Discharge Instructions Carotid Endarterectomy (CEA)  Please refer to the following instructions for your post-procedure care. Your surgeon or physician assistant will discuss any changes with you.  Activity  You are encouraged to walk as much as you can. You can slowly return to normal activities but must avoid strenuous activity and heavy lifting until your doctor tell you it's OK. Avoid activities such as vacuuming or swinging a golf club. You can drive after one week if you are comfortable and you are no longer taking prescription pain medications. It is normal to feel tired for serval weeks after your surgery. It is also normal to have difficulty with sleep habits, eating, and bowel movements after surgery. These will go away with time.  Bathing/Showering  You may shower after you come home. Do not soak in a bathtub, hot tub, or swim until the incision heals completely.  Incision Care  Shower every day. Clean your incision with mild soap and water. Pat the area dry with a clean towel. You do not need a bandage unless otherwise instructed. Do not apply any ointments or creams to your incision. You may have skin glue on your incision. Do not peel it off. It will come off on its own in about one week. Your incision may feel thickened and raised for several weeks after your surgery. This is normal and the skin will soften over time. For Men Only: It's OK to shave around the incision but do not shave the incision itself  for 2 weeks. It is common to have numbness under your chin that could last  for several months.  Diet  Resume your normal diet. There are no special food restrictions following this procedure. A low fat/low cholesterol diet is recommended for all patients with vascular disease. In order to heal from your surgery, it is CRITICAL to get adequate nutrition. Your body requires vitamins, minerals, and protein. Vegetables are the best source of vitamins and minerals. Vegetables also provide the perfect balance of protein. Processed food has little nutritional value, so try to avoid this.  Medications  Resume taking all of your medications unless your doctor or physician assistant tells you not to.  If your incision is causing pain, you may take over-the- counter pain relievers such as acetaminophen (Tylenol). If you were prescribed a stronger pain medication, please be aware these medications can cause nausea and constipation.  Prevent nausea by taking the medication with a snack or meal. Avoid constipation by drinking plenty of fluids and eating foods with a high amount of fiber, such as fruits, vegetables, and grains.  Do not take Tylenol if you are taking prescription pain medications.  Follow Up  Our office will schedule a follow up appointment 2-3 weeks following discharge.  Please call us immediately for any of the following conditions  Increased pain, redness, drainage (pus) from your incision site. Fever of 101 degrees or higher. If you should develop stroke (slurred speech, difficulty swallowing, weakness on one side of your body, loss of vision) you should call 911 and go to the nearest emergency room.  Reduce your risk of vascular disease:  Stop smoking. If you would like help call [facility] at 1-800-QUIT-NOW (929)109-6040) or [place] at [phone]. Manage your cholesterol Maintain a desired weight Control your diabetes Keep your blood pressure down  If you have any  questions, please call the office at [phone].  Prescriptions given: 1.   Norco#8 No Refill  Disposition: home  Patient's condition: is Good  Follow up: 1. [facility] in 3 weeks    Leontine Locket, PA-C [facility] [phone]   --- For [facility] Registry use ---   Modified Rankin score at D/C (0-6): 0  IV medication needed for:  1. Hypertension: No 2. Hypotension: No  Post-op Complications: No  1. Post-op CVA or TIA: No  If yes: Event classification (right eye, left eye, right cortical, left cortical, verterobasilar, other): n/a  If yes: Timing of event (intra-op, <6 hrs post-op, >=6 hrs post-op, unknown): n/a  2. CN injury: No  If yes: CN n/a injuried   3. Myocardial infarction: No  If yes: Dx by (EKG or clinical, Troponin): n/a  4.  CHF: No  5.  Dysrhythmia (new): No  6. Wound infection: No  7. Reperfusion symptoms: No  8. Return to OR: No  If yes: return to OR for (bleeding, neurologic, other CEA incision, other): n/a  Discharge medications: Statin use:  Yes ASA use:  Yes   Beta blocker use:  Yes ACE-Inhibitor use:  No  ARB use:  No CCB use: No P2Y12 Antagonist use: No, [ ]  Plavix, [ ]  Plasugrel, [ ]  Ticlopinine, [ ]  Ticagrelor, [ ]  Other, [ ]  No for medical reason, [ ]  Non-compliant, [ ]  Not-indicated Anti-coagulant use:  Yes-Eliquis [ ]  Warfarin, [ ]  Rivaroxaban, [ ]  Dabigatran,

## 2021-12-31 NOTE — Progress Notes (Signed)
PHARMACIST LIPID MONITORING   Thomas Marshall is a 81 y.o. male admitted on 12/30/2021 s/p CEA.  Pharmacy has been consulted to optimize lipid-lowering therapy with the indication of secondary prevention for clinical ASCVD.  Recent Labs:  Lipid Panel (last 6 months):   Lab Results  Component Value Date   CHOL 103 12/31/2021   TRIG 59 12/31/2021   HDL 37 (L) 12/31/2021   CHOLHDL 2.8 12/31/2021   VLDL 12 12/31/2021   LDLCALC 54 12/31/2021    Hepatic function panel (last 6 months):   Lab Results  Component Value Date   AST 21 12/26/2021   ALT 22 12/26/2021   ALKPHOS 69 12/26/2021   BILITOT 1.0 12/26/2021   BILIDIR 0.2 10/15/2021    SCr (since admission):   Serum creatinine: 1.99 mg/dL (H) 12/31/21 0500 Estimated creatinine clearance: 33.3 mL/min (A)  Current therapy and lipid therapy tolerance Current lipid-lowering therapy: atorvastatin 40mg  Documented or reported allergies or intolerances to lipid-lowering therapies (if applicable): None   Plan:    1.Statin intensity (high intensity recommended for all patients regardless of the LDL):  No statin changes. The patient is already on a high intensity statin.  2.Add ezetimibe (if any one of the following):   Not indicated at this time.  3.Refer to lipid clinic:   No  4.Follow-up with:  Primary care provider - [name], MD  5.Follow-up labs after discharge:  No changes in lipid therapy, repeat a lipid panel in one year.     Hildred Laser, PharmD Clinical Pharmacist **Pharmacist phone directory can now be found on [email or web address] ([email or web address]).  Listed under [place].

## 2021-12-31 NOTE — Discharge Instructions (Signed)
   [facility] [place]  Discharge Instructions   Carotid Surgery  Please refer to the following instructions for your post-procedure care. Your surgeon or physician assistant will discuss any changes with you.  Activity  You are encouraged to walk as much as you can. You can slowly return to normal activities but must avoid strenuous activity and heavy lifting until your doctor tell you it's okay. Avoid activities such as vacuuming or swinging a golf club. You can drive after one week if you are comfortable and you are no longer taking prescription pain medications. It is normal to feel tired for serval weeks after your surgery. It is also normal to have difficulty with sleep habits, eating, and bowel movements after surgery. These will go away with time.  Bathing/Showering  Shower daily after you go home. Do not soak in a bathtub, hot tub, or swim until the incision heals completely.  Incision Care  Shower every day. Clean your incision with mild soap and water. Pat the area dry with a clean towel. You do not need a bandage unless otherwise instructed. Do not apply any ointments or creams to your incision. You may have skin glue on your incision. Do not peel it off. It will come off on its own in about one week. Your incision may feel thickened and raised for several weeks after your surgery. This is normal and the skin will soften over time.   For Men Only: It's okay to shave around the incision but do not shave the incision itself for 2 weeks. It is common to have numbness under your chin that could last for several months.  Diet  Resume your normal diet. There are no special food restrictions following this procedure. A low fat/low cholesterol diet is recommended for all patients with vascular disease. In order to heal from your surgery, it is CRITICAL to get adequate nutrition. Your body requires vitamins, minerals, and protein. Vegetables are the best source of  vitamins and minerals. Vegetables also provide the perfect balance of protein. Processed food has little nutritional value, so try to avoid this.  Medications  Resume taking all of your medications unless your doctor or physician assistant tells you not to. If your incision is causing pain, you may take over-the- counter pain relievers such as acetaminophen (Tylenol). If you were prescribed a stronger pain medication, please be aware these medications can cause nausea and constipation. Prevent nausea by taking the medication with a snack or meal. Avoid constipation by drinking plenty of fluids and eating foods with a high amount of fiber, such as fruits, vegetables, and grains.   Do not take Tylenol if you are taking prescription pain medications.  Follow Up  Our office will schedule a follow up appointment 2-3 weeks following discharge.  Please call us immediately for any of the following conditions  . Increased pain, redness, drainage (pus) from your incision site. . Fever of 101 degrees or higher. . If you should develop stroke (slurred speech, difficulty swallowing, weakness on one side of your body, loss of vision) you should call 911 and go to the nearest emergency room. .  Reduce your risk of vascular disease:  . Stop smoking. If you would like help call [facility] at 1-800-QUIT-NOW (1-800-784-8669) or [place] at [phone]. . Manage your cholesterol . Maintain a desired weight . Control your diabetes . Keep your blood pressure down .  If you have any questions, please call the office at [phone]. 

## 2021-12-31 NOTE — Progress Notes (Signed)
Patient given discharge instructions. PIV removed. Telemetry box removed, CCMD notified. Patient taken by wheelchair to vehicle by staff.  Daymon Larsen, RN

## 2022-01-01 DIAGNOSIS — N183 Chronic kidney disease, stage 3 unspecified: Secondary | ICD-10-CM | POA: Diagnosis not present

## 2022-01-01 DIAGNOSIS — Z7982 Long term (current) use of aspirin: Secondary | ICD-10-CM | POA: Diagnosis not present

## 2022-01-01 DIAGNOSIS — I2510 Atherosclerotic heart disease of native coronary artery without angina pectoris: Secondary | ICD-10-CM | POA: Diagnosis not present

## 2022-01-01 DIAGNOSIS — I480 Paroxysmal atrial fibrillation: Secondary | ICD-10-CM | POA: Diagnosis not present

## 2022-01-01 DIAGNOSIS — E1122 Type 2 diabetes mellitus with diabetic chronic kidney disease: Secondary | ICD-10-CM | POA: Diagnosis not present

## 2022-01-01 DIAGNOSIS — Z48812 Encounter for surgical aftercare following surgery on the circulatory system: Secondary | ICD-10-CM | POA: Diagnosis not present

## 2022-01-01 DIAGNOSIS — Z950 Presence of cardiac pacemaker: Secondary | ICD-10-CM | POA: Diagnosis not present

## 2022-01-01 DIAGNOSIS — I10 Essential (primary) hypertension: Secondary | ICD-10-CM | POA: Diagnosis not present

## 2022-01-01 DIAGNOSIS — Z7901 Long term (current) use of anticoagulants: Secondary | ICD-10-CM | POA: Diagnosis not present

## 2022-01-01 DIAGNOSIS — E785 Hyperlipidemia, unspecified: Secondary | ICD-10-CM | POA: Diagnosis not present

## 2022-01-02 DIAGNOSIS — N183 Chronic kidney disease, stage 3 unspecified: Secondary | ICD-10-CM | POA: Diagnosis not present

## 2022-01-02 DIAGNOSIS — I10 Essential (primary) hypertension: Secondary | ICD-10-CM | POA: Diagnosis not present

## 2022-01-02 DIAGNOSIS — I480 Paroxysmal atrial fibrillation: Secondary | ICD-10-CM | POA: Diagnosis not present

## 2022-01-02 DIAGNOSIS — Z48812 Encounter for surgical aftercare following surgery on the circulatory system: Secondary | ICD-10-CM | POA: Diagnosis not present

## 2022-01-02 DIAGNOSIS — E1122 Type 2 diabetes mellitus with diabetic chronic kidney disease: Secondary | ICD-10-CM | POA: Diagnosis not present

## 2022-01-02 DIAGNOSIS — I2510 Atherosclerotic heart disease of native coronary artery without angina pectoris: Secondary | ICD-10-CM | POA: Diagnosis not present

## 2022-01-05 DIAGNOSIS — I2510 Atherosclerotic heart disease of native coronary artery without angina pectoris: Secondary | ICD-10-CM | POA: Diagnosis not present

## 2022-01-05 DIAGNOSIS — I480 Paroxysmal atrial fibrillation: Secondary | ICD-10-CM | POA: Diagnosis not present

## 2022-01-05 DIAGNOSIS — E1122 Type 2 diabetes mellitus with diabetic chronic kidney disease: Secondary | ICD-10-CM | POA: Diagnosis not present

## 2022-01-05 DIAGNOSIS — N183 Chronic kidney disease, stage 3 unspecified: Secondary | ICD-10-CM | POA: Diagnosis not present

## 2022-01-05 DIAGNOSIS — I10 Essential (primary) hypertension: Secondary | ICD-10-CM | POA: Diagnosis not present

## 2022-01-05 DIAGNOSIS — Z48812 Encounter for surgical aftercare following surgery on the circulatory system: Secondary | ICD-10-CM | POA: Diagnosis not present

## 2022-01-07 DIAGNOSIS — I480 Paroxysmal atrial fibrillation: Secondary | ICD-10-CM | POA: Diagnosis not present

## 2022-01-07 DIAGNOSIS — N183 Chronic kidney disease, stage 3 unspecified: Secondary | ICD-10-CM | POA: Diagnosis not present

## 2022-01-07 DIAGNOSIS — I10 Essential (primary) hypertension: Secondary | ICD-10-CM | POA: Diagnosis not present

## 2022-01-07 DIAGNOSIS — E1122 Type 2 diabetes mellitus with diabetic chronic kidney disease: Secondary | ICD-10-CM | POA: Diagnosis not present

## 2022-01-07 DIAGNOSIS — Z48812 Encounter for surgical aftercare following surgery on the circulatory system: Secondary | ICD-10-CM | POA: Diagnosis not present

## 2022-01-07 DIAGNOSIS — I2510 Atherosclerotic heart disease of native coronary artery without angina pectoris: Secondary | ICD-10-CM | POA: Diagnosis not present

## 2022-01-09 ENCOUNTER — Ambulatory Visit: Payer: Medicare Other | Admitting: Cardiovascular Disease

## 2022-01-09 DIAGNOSIS — Z48812 Encounter for surgical aftercare following surgery on the circulatory system: Secondary | ICD-10-CM | POA: Diagnosis not present

## 2022-01-09 DIAGNOSIS — I480 Paroxysmal atrial fibrillation: Secondary | ICD-10-CM | POA: Diagnosis not present

## 2022-01-09 DIAGNOSIS — E1122 Type 2 diabetes mellitus with diabetic chronic kidney disease: Secondary | ICD-10-CM | POA: Diagnosis not present

## 2022-01-09 DIAGNOSIS — I10 Essential (primary) hypertension: Secondary | ICD-10-CM | POA: Diagnosis not present

## 2022-01-09 DIAGNOSIS — I2510 Atherosclerotic heart disease of native coronary artery without angina pectoris: Secondary | ICD-10-CM | POA: Diagnosis not present

## 2022-01-09 DIAGNOSIS — N183 Chronic kidney disease, stage 3 unspecified: Secondary | ICD-10-CM | POA: Diagnosis not present

## 2022-01-12 ENCOUNTER — Ambulatory Visit: Payer: Medicare Other | Admitting: Cardiovascular Disease

## 2022-01-13 DIAGNOSIS — I10 Essential (primary) hypertension: Secondary | ICD-10-CM | POA: Diagnosis not present

## 2022-01-13 DIAGNOSIS — I480 Paroxysmal atrial fibrillation: Secondary | ICD-10-CM | POA: Diagnosis not present

## 2022-01-13 DIAGNOSIS — N183 Chronic kidney disease, stage 3 unspecified: Secondary | ICD-10-CM | POA: Diagnosis not present

## 2022-01-13 DIAGNOSIS — I2510 Atherosclerotic heart disease of native coronary artery without angina pectoris: Secondary | ICD-10-CM | POA: Diagnosis not present

## 2022-01-13 DIAGNOSIS — E1122 Type 2 diabetes mellitus with diabetic chronic kidney disease: Secondary | ICD-10-CM | POA: Diagnosis not present

## 2022-01-13 DIAGNOSIS — Z48812 Encounter for surgical aftercare following surgery on the circulatory system: Secondary | ICD-10-CM | POA: Diagnosis not present

## 2022-01-16 DIAGNOSIS — Z48812 Encounter for surgical aftercare following surgery on the circulatory system: Secondary | ICD-10-CM | POA: Diagnosis not present

## 2022-01-16 DIAGNOSIS — N183 Chronic kidney disease, stage 3 unspecified: Secondary | ICD-10-CM | POA: Diagnosis not present

## 2022-01-16 DIAGNOSIS — I10 Essential (primary) hypertension: Secondary | ICD-10-CM | POA: Diagnosis not present

## 2022-01-16 DIAGNOSIS — I2510 Atherosclerotic heart disease of native coronary artery without angina pectoris: Secondary | ICD-10-CM | POA: Diagnosis not present

## 2022-01-16 DIAGNOSIS — E1122 Type 2 diabetes mellitus with diabetic chronic kidney disease: Secondary | ICD-10-CM | POA: Diagnosis not present

## 2022-01-16 DIAGNOSIS — I480 Paroxysmal atrial fibrillation: Secondary | ICD-10-CM | POA: Diagnosis not present

## 2022-01-20 ENCOUNTER — Ambulatory Visit (INDEPENDENT_AMBULATORY_CARE_PROVIDER_SITE_OTHER): Payer: Medicare Other | Admitting: Internal Medicine

## 2022-01-20 ENCOUNTER — Encounter: Payer: Self-pay | Admitting: Internal Medicine

## 2022-01-20 ENCOUNTER — Other Ambulatory Visit: Payer: Self-pay

## 2022-01-20 VITALS — BP 124/64 | HR 62 | Ht 74.0 in | Wt 214.0 lb

## 2022-01-20 DIAGNOSIS — I4819 Other persistent atrial fibrillation: Secondary | ICD-10-CM | POA: Diagnosis not present

## 2022-01-20 DIAGNOSIS — I495 Sick sinus syndrome: Secondary | ICD-10-CM

## 2022-01-20 DIAGNOSIS — Z950 Presence of cardiac pacemaker: Secondary | ICD-10-CM | POA: Diagnosis not present

## 2022-01-20 DIAGNOSIS — I6522 Occlusion and stenosis of left carotid artery: Secondary | ICD-10-CM

## 2022-01-20 DIAGNOSIS — I493 Ventricular premature depolarization: Secondary | ICD-10-CM | POA: Diagnosis not present

## 2022-01-20 DIAGNOSIS — I10 Essential (primary) hypertension: Secondary | ICD-10-CM

## 2022-01-20 NOTE — Patient Instructions (Signed)
Medication Instructions:  Your physician recommends that you continue on your current medications as directed. Please refer to the Current Medication list given to you today.  Labwork: None ordered.  Testing/Procedures: None ordered.  Follow-Up: Your physician wants you to follow-up in: one year with Cristopher Peru, MD or one of the following Advanced Practice Providers on your designated Care Team:   Tommye Standard, [place] Legrand [place] "Jonni Sanger" Chalmers Cater, [place]  Remote monitoring is used to monitor your Pacemaker from home. This monitoring reduces the number of office visits required to check your device to one time per year. It allows [place] to keep an eye on the functioning of your device to ensure it is working properly. You are scheduled for a device check from home on 03/16/2022. You may send your transmission at any time that day. If you have a wireless device, the transmission will be sent automatically. After your physician reviews your transmission, you will receive a postcard with your next transmission date.  Any Other Special Instructions Will Be Listed Below (If Applicable).  If you need a refill on your cardiac medications before your next appointment, please call your pharmacy.

## 2022-01-20 NOTE — Progress Notes (Signed)
HPI [name] returns today after developing recurrent atrial fib despite dofetilide. He was placed on amiodarone  After presenting with atrial fib and a RVR. He then developed NSR and has had sinus bradycardia. He had his amio held and then reverted back to atrial fib with a RVR. He presents today for followup.  His rates are now fairly well controlled in atrial fib.  He states that he feels fairly well altogether. No syncope.  Allergies  Allergen Reactions   Lisinopril Swelling and Other (See Comments)    Angioedema.    Losartan Swelling and Other (See Comments)    Lips swell Angioedema    Nifedipine Swelling and Other (See Comments)    Sugar increase   Triamterene Swelling and Other (See Comments)    Face swells, no breathing impairment   Hydralazine Hcl Other (See Comments)    Fatigue; poor appetite   Losartan Potassium-Hctz Swelling and Other (See Comments)   Amoxicillin Rash and Other (See Comments)    Did it involve swelling of the face/tongue/throat, SOB, or low BP? No Did it involve sudden or severe rash/hives, skin peeling, or any reaction on the inside of your mouth or nose? No Did you need to seek medical attention at a hospital or doctor's office? No When did it last happen?      10 + years If all above answers are NO, may proceed with cephalosporin use.     Ampicillin Rash and Other (See Comments)    Did it involve swelling of the face/tongue/throat, SOB, or low BP? No Did it involve sudden or severe rash/hives, skin peeling, or any reaction on the inside of your mouth or nose? No Did you need to seek medical attention at a hospital or doctor's office? No When did it last happen?      10 + years If all above answers are "NO", may proceed with cephalosporin use.    Carvedilol Other (See Comments)    Low heart rate (in higher doses)     Current Outpatient Medications  Medication Sig Dispense Refill   ACCU-CHEK GUIDE test strip USE 1 NEW TEST STRIP ONCE  DAILY TO TEST BLOOD SUGAR 50 each 0   Accu-Chek Softclix Lancets lancets USE 1 TWICE DAILY USE AS DIRECTED E11.9 100 each 3   apixaban (ELIQUIS) 2.5 MG TABS tablet Take 1 tablet (2.5 mg total) by mouth 2 (two) times daily. (0800 & 2000) 180 tablet 3   aspirin EC 81 MG tablet Take 1 tablet (81 mg total) by mouth daily. 90 tablet 3   atorvastatin (LIPITOR) 40 MG tablet Take 40 mg by mouth at bedtime.      carvedilol (COREG) 6.25 MG tablet Take 1 tablet (6.25 mg total) by mouth 2 (two) times daily. 180 tablet 3   cholecalciferol (VITAMIN D3) 25 MCG (1000 UNIT) tablet Take 1,000 Units by mouth every evening.     glucose blood test strip Accu-Check Guide Strips. Dx E11.9 Use to test blood glucose level 1 time a day. 100 each 11   hydrALAZINE (APRESOLINE) 10 MG tablet Take 10 mg by mouth 3 (three) times daily as needed (elevated bp greater than 160).     HYDROcodone-acetaminophen (NORCO) 5-325 MG tablet Take 1 tablet by mouth every 6 (six) hours as needed for moderate pain. 8 tablet 0   magnesium oxide (MAG-OX) 400 MG tablet Take 800 mg by mouth 2 (two) times daily.      meclizine (ANTIVERT) 25 MG tablet  Take 1 tablet (25 mg total) by mouth 3 (three) times daily as needed for dizziness. 30 tablet 0   nitroGLYCERIN (NITROSTAT) 0.4 MG SL tablet Place 1 tablet (0.4 mg total) under the tongue every 5 (five) minutes as needed for chest pain (x 3 doses). 30 tablet 3   pantoprazole (PROTONIX) 40 MG tablet Take 1 tablet (40 mg total) by mouth daily. 90 tablet 3   terazosin (HYTRIN) 1 MG capsule Take 1 mg by mouth at bedtime.  30 capsule 11   vitamin C (ASCORBIC ACID) 500 MG tablet Take 500 mg by mouth daily with breakfast.      Zinc 22.5 MG TABS Take 22.5 mg by mouth 2 (two) times a week.     No current facility-administered medications for this visit.     Past Medical History:  Diagnosis Date   AAA (abdominal aortic aneurysm)    Arthritis    Cancer ([place])    skin & squamous cell   Carotid artery  disease ([place])    a. s/p R CEA 12/2015.   CKD (chronic kidney disease), stage III ([place])    a. per historical labs.   Coronary artery disease    a. s/p CABG 2004.   Diabetes mellitus without complication (HCC)    GERD (gastroesophageal reflux disease)    Hyperlipidemia    Hypertension    Medication intolerance Multiple    PAF (paroxysmal atrial fibrillation) (HCC)    Pneumonia    Presence of permanent cardiac pacemaker    Sinus bradycardia    a. baseline HR 50s-60s, also h/o bradycardia on metoprolol and carvedilol    ROS:   All systems reviewed and negative except as noted in the HPI.   Past Surgical History:  Procedure Laterality Date   ANKLE SURGERY     right fused   BACK SURGERY     low back    CARDIOVERSION N/A 02/14/2020   Procedure: CARDIOVERSION;  Surgeon: Geralynn Rile, MD;  Location: [facility];  Service: Cardiovascular;  Laterality: N/A;   CARDIOVERSION N/A 02/26/2020   Procedure: CARDIOVERSION;  Surgeon: [name], MD;  Location: [facility] ENDOSCOPY;  Service: Cardiovascular;  Laterality: N/A;   CARDIOVERSION N/A 04/12/2020   Procedure: CARDIOVERSION;  Surgeon: Skeet Latch, MD;  Location: [facility] ENDOSCOPY;  Service: Cardiovascular;  Laterality: N/A;   CHOLECYSTECTOMY N/A 12/07/2013   Procedure: LAPAROSCOPIC CHOLECYSTECTOMY ;  Surgeon: [name], MD;  Location: [facility] OR;  Service: General;  Laterality: N/A;   COLONOSCOPY     CORONARY ARTERY BYPASS GRAFT  2004   [place] N/A 05/21/2017   Procedure: Coronary Stent Intervention;  Surgeon: [name], MD;  Location: [place] CV LAB;  Service: Cardiovascular;  Laterality: N/A;   ENDARTERECTOMY Right 12/10/2015   Procedure: Right Carotid ENDARTERECTOMY;  Surgeon: Conrad [place], MD;  Location: [place];  Service: Vascular;  Laterality: Right;   ENDARTERECTOMY Left 12/30/2021   Procedure: LEFT CAROTID ENDARTERECTOMY;  Surgeon: Angelia Mould, MD;  Location: [facility] OR;   Service: Vascular;  Laterality: Left;   EXPLORATION POST OPERATIVE OPEN HEART     FINGER SURGERY     skin graft on rt index   FINGER SURGERY Right    right index finger.   HERNIA REPAIR Right    RIH   JOINT REPLACEMENT     KNEE SURGERY     replacement on both knees   LEFT HEART CATH AND CORS/GRAFTS ANGIOGRAPHY N/A 05/21/2017   Procedure: Left  Heart Cath and Cors/Grafts Angiography;  Surgeon: [name], MD;  Location: [place] CV LAB;  Service: Cardiovascular;  Laterality: N/A;   LEFT HEART CATH AND CORS/GRAFTS ANGIOGRAPHY N/A 05/20/2020   Procedure: LEFT HEART CATH AND CORS/GRAFTS ANGIOGRAPHY;  Surgeon: [name], MD;  Location: [place] CV LAB;  Service: Cardiovascular;  Laterality: N/A;   PACEMAKER IMPLANT N/A 03/14/2020   Procedure: PACEMAKER IMPLANT;  Surgeon: Evans Lance, MD;  Location: [place] CV LAB;  Service: Cardiovascular;  Laterality: N/A;   PROSTATE SURGERY     PVC ABLATION N/A 10/27/2017   Procedure: PVC ABLATION;  Surgeon: Evans Lance, MD;  Location: [place] CV LAB;  Service: Cardiovascular;  Laterality: N/A;   TONSILLECTOMY     URINARY SURGERY     scar tissue     Family History  Problem Relation Age of Onset   Stroke Mother    Heart disease Mother    Heart disease Father    Hypertension Father    Hyperlipidemia Father    Cancer Sister        breast  and skin   Diabetes Sister    Hypertension Sister    Diabetes Brother    Heart disease Brother    Hyperlipidemia Brother    Hypertension Brother    Early death Neg Hx      Social History   Socioeconomic History   Marital status: Married    Spouse name: Not on file   Number of children: 2   Years of education: Not on file   Highest education level: Not on file  Occupational History   Occupation: retired  Tobacco Use   Smoking status: Former    Packs/day: 1.00    Years: 13.00    Pack years: 13.00    Types: Cigarettes    Quit date: 11/30/1970    Years since quitting:  51.1   Smokeless tobacco: Never  Vaping Use   Vaping Use: Never used  Substance and Sexual Activity   Alcohol use: No    Alcohol/week: 0.0 standard drinks   Drug use: No   Sexual activity: Yes  Other Topics Concern   Not on file  Social History Narrative   Not on file   Social Determinants of Health   Financial Resource Strain: Not on file  Food Insecurity: Not on file  Transportation Needs: Not on file  Physical Activity: Not on file  Stress: Not on file  Social Connections: Not on file  Intimate Partner Violence: Not on file     BP 124/64    Pulse 62    Ht 6\' 2"  (1.88 m)    Wt 214 lb (97.1 kg)    SpO2 97%    BMI 27.48 kg/m   Physical Exam:  Well appearing 81 yo man, NAD HEENT: Unremarkable Neck:  6 cm JVD, no thyromegally Lymphatics:  No adenopathy Back:  No CVA tenderness Lungs:  Clear with no wheezes HEART:  Regular rate rhythm, no murmurs, no rubs, no clicks Abd:  soft, positive bowel sounds, no organomegally, no rebound, no guarding Ext:  2 plus pulses, no edema, no cyanosis, no clubbing Skin:  No rashes no nodules Neuro:  CN II through XII intact, motor grossly intact   DEVICE  Normal device function.  See PaceArt for details.   Assess/Plan:  1. Atrial fib - his VR is well controlled. He will continue his current meds. 2. Coags - he has not had bleeding on eliquis. 3. HTN -  his bp is controlled. 4. PVC's - he is s/p ablation and doing well. 5. PPM - his [employer] PPM is programmed VVIR. We will follow.   Carleene Overlie Stephone Gum,MD

## 2022-01-21 LAB — CUP PACEART INCLINIC DEVICE CHECK
Battery Remaining Longevity: 120 mo
Battery Voltage: 3.02 V
Brady Statistic RA Percent Paced: 0 %
Brady Statistic RV Percent Paced: 0.6 %
Date Time Interrogation Session: 20230221123700
Implantable Lead Implant Date: 20210415
Implantable Lead Implant Date: 20210415
Implantable Lead Location: 753859
Implantable Lead Location: 753860
Implantable Pulse Generator Implant Date: 20210415
Lead Channel Impedance Value: 562.5 Ohm
Lead Channel Pacing Threshold Amplitude: 0.5 V
Lead Channel Pacing Threshold Amplitude: 0.5 V
Lead Channel Pacing Threshold Pulse Width: 0.5 ms
Lead Channel Pacing Threshold Pulse Width: 0.5 ms
Lead Channel Sensing Intrinsic Amplitude: 11.7 mV
Lead Channel Sensing Intrinsic Amplitude: 3.1 mV
Lead Channel Setting Pacing Amplitude: 2.5 V
Lead Channel Setting Pacing Pulse Width: 0.5 ms
Lead Channel Setting Sensing Sensitivity: 2 mV
Pulse Gen Model: 2272
Pulse Gen Serial Number: 3813712

## 2022-01-26 DIAGNOSIS — L821 Other seborrheic keratosis: Secondary | ICD-10-CM | POA: Diagnosis not present

## 2022-01-26 DIAGNOSIS — L578 Other skin changes due to chronic exposure to nonionizing radiation: Secondary | ICD-10-CM | POA: Diagnosis not present

## 2022-01-26 DIAGNOSIS — C44329 Squamous cell carcinoma of skin of other parts of face: Secondary | ICD-10-CM | POA: Diagnosis not present

## 2022-01-26 DIAGNOSIS — L570 Actinic keratosis: Secondary | ICD-10-CM | POA: Diagnosis not present

## 2022-01-28 DIAGNOSIS — H2523 Age-related cataract, morgagnian type, bilateral: Secondary | ICD-10-CM | POA: Insufficient documentation

## 2022-01-28 DIAGNOSIS — I2510 Atherosclerotic heart disease of native coronary artery without angina pectoris: Secondary | ICD-10-CM | POA: Insufficient documentation

## 2022-01-28 DIAGNOSIS — E559 Vitamin D deficiency, unspecified: Secondary | ICD-10-CM | POA: Insufficient documentation

## 2022-01-29 ENCOUNTER — Other Ambulatory Visit: Payer: Self-pay

## 2022-01-29 ENCOUNTER — Ambulatory Visit (INDEPENDENT_AMBULATORY_CARE_PROVIDER_SITE_OTHER): Payer: Medicare Other | Admitting: Physician Assistant

## 2022-01-29 VITALS — BP 121/69 | HR 52 | Temp 97.7°F | Ht 74.0 in | Wt 212.3 lb

## 2022-01-29 DIAGNOSIS — I6523 Occlusion and stenosis of bilateral carotid arteries: Secondary | ICD-10-CM

## 2022-01-29 NOTE — Progress Notes (Signed)
?POST OPERATIVE OFFICE NOTE ? ? ? ?CC:  F/u for surgery ? ?HPI:  This is a 81 y.o. male who is s/p L CEA by Dr. Scot Dock on 12/30/21 due to asymptomatic high-grade carotid stenosis.  Surgical history also significant for right CEA by Dr. Bridgett Larsson in 2017 for asymptomatic stenosis.  He denies any strokelike symptoms since surgery including slurring speech, changes in vision, or one-sided weakness.  Left neck incision is completely healed.  He is slowly working back to his activity level prior to surgery.  He was recently seen by his cardiologist who believes his generalized weakness is likely related to his persistent atrial fibrillation. ? ?Allergies  ?Allergen Reactions  ? Lisinopril Swelling and Other (See Comments)  ?  Angioedema.   ? Losartan Swelling and Other (See Comments)  ?  Lips swell ?Angioedema ?  ? Nifedipine Swelling and Other (See Comments)  ?  Sugar increase  ? Triamterene Swelling and Other (See Comments)  ?  Face swells, no breathing impairment  ? Hydralazine Hcl Other (See Comments)  ?  Fatigue; poor appetite  ? Losartan Potassium-Hctz Swelling and Other (See Comments)  ? Amoxicillin Rash and Other (See Comments)  ?  Did it involve swelling of the face/tongue/throat, SOB, or low BP? No ?Did it involve sudden or severe rash/hives, skin peeling, or any reaction on the inside of your mouth or nose? No ?Did you need to seek medical attention at a hospital or doctor's office? No ?When did it last happen?      10 + years ?If all above answers are ?NO?, may proceed with cephalosporin use. ? ?  ? Ampicillin Rash and Other (See Comments)  ?  Did it involve swelling of the face/tongue/throat, SOB, or low BP? No ?Did it involve sudden or severe rash/hives, skin peeling, or any reaction on the inside of your mouth or nose? No ?Did you need to seek medical attention at a hospital or doctor's office? No ?When did it last happen?      10 + years ?If all above answers are "NO", may proceed with cephalosporin use. ?  ?  Carvedilol Other (See Comments)  ?  Low heart rate (in higher doses)  ? ? ?Current Outpatient Medications  ?Medication Sig Dispense Refill  ? ACCU-CHEK GUIDE test strip USE 1 NEW TEST STRIP ONCE DAILY TO TEST BLOOD SUGAR 50 each 0  ? Accu-Chek Softclix Lancets lancets USE 1 TWICE DAILY USE AS DIRECTED E11.9 100 each 3  ? apixaban (ELIQUIS) 2.5 MG TABS tablet Take 1 tablet (2.5 mg total) by mouth 2 (two) times daily. (0800 & 2000) 180 tablet 3  ? aspirin EC 81 MG tablet Take 1 tablet (81 mg total) by mouth daily. 90 tablet 3  ? atorvastatin (LIPITOR) 40 MG tablet Take 40 mg by mouth at bedtime.     ? carvedilol (COREG) 6.25 MG tablet Take 1 tablet (6.25 mg total) by mouth 2 (two) times daily. 180 tablet 3  ? cholecalciferol (VITAMIN D3) 25 MCG (1000 UNIT) tablet Take 1,000 Units by mouth every evening.    ? glucose blood test strip Accu-Check Guide Strips. Dx E11.9 Use to test blood glucose level 1 time a day. 100 each 11  ? hydrALAZINE (APRESOLINE) 10 MG tablet Take 10 mg by mouth 3 (three) times daily as needed (elevated bp greater than 160).    ? magnesium oxide (MAG-OX) 400 MG tablet Take 800 mg by mouth 2 (two) times daily.     ?  meclizine (ANTIVERT) 25 MG tablet Take 1 tablet (25 mg total) by mouth 3 (three) times daily as needed for dizziness. 30 tablet 0  ? nitroGLYCERIN (NITROSTAT) 0.4 MG SL tablet Place 1 tablet (0.4 mg total) under the tongue every 5 (five) minutes as needed for chest pain (x 3 doses). 30 tablet 3  ? pantoprazole (PROTONIX) 40 MG tablet Take 1 tablet (40 mg total) by mouth daily. 90 tablet 3  ? terazosin (HYTRIN) 1 MG capsule Take 1 mg by mouth at bedtime.  30 capsule 11  ? vitamin C (ASCORBIC ACID) 500 MG tablet Take 500 mg by mouth daily with breakfast.     ? Zinc 22.5 MG TABS Take 22.5 mg by mouth 2 (two) times a week.    ? HYDROcodone-acetaminophen (NORCO) 5-325 MG tablet Take 1 tablet by mouth every 6 (six) hours as needed for moderate pain. (Patient not taking: Reported on 01/29/2022)  8 tablet 0  ? ?No current facility-administered medications for this visit.  ? ? ? ROS:  See HPI ? ?Physical Exam: ? ?Vitals:  ? 01/29/22 1016 01/29/22 1018  ?BP: 134/84 121/69  ?Pulse: 80 (!) 52  ?Temp: 97.7 ?F (36.5 ?C)   ?Weight: 212 lb 4.8 oz (96.3 kg)   ?Height: 6\' 2"  (1.88 m)   ? ? ?Incision:  L neck incision well healed ?Extremities: Moving all extremities well ?Neuro: Cranial nerves grossly intact ? ?Assessment/Plan:  This is a 81 y.o. male who is s/p: ?Left carotid endarterectomy for asymptomatic high-grade stenosis ? ?-Subjectively, no neurological events postoperatively ?-Left neck incision is well-healed ?-No activity restriction from vascular surgery standpoint ?-Check bilateral carotid duplex in 9 months ?-Patient and his wife know to call/return office sooner with any questions or concerns ? ? ?Dagoberto Ligas, PA-C ?[facility] ?[phone] ? ?Clinic MD:  Scot Dock ? ?

## 2022-01-30 ENCOUNTER — Other Ambulatory Visit: Payer: Self-pay | Admitting: *Deleted

## 2022-01-30 DIAGNOSIS — I6523 Occlusion and stenosis of bilateral carotid arteries: Secondary | ICD-10-CM

## 2022-02-04 LAB — HM DIABETES EYE EXAM

## 2022-02-05 ENCOUNTER — Other Ambulatory Visit: Payer: Self-pay

## 2022-02-05 ENCOUNTER — Ambulatory Visit (INDEPENDENT_AMBULATORY_CARE_PROVIDER_SITE_OTHER): Payer: Medicare Other | Admitting: Podiatry

## 2022-02-05 DIAGNOSIS — L84 Corns and callosities: Secondary | ICD-10-CM | POA: Diagnosis not present

## 2022-02-05 DIAGNOSIS — E1140 Type 2 diabetes mellitus with diabetic neuropathy, unspecified: Secondary | ICD-10-CM | POA: Diagnosis not present

## 2022-02-05 DIAGNOSIS — Z7901 Long term (current) use of anticoagulants: Secondary | ICD-10-CM

## 2022-02-05 DIAGNOSIS — B351 Tinea unguium: Secondary | ICD-10-CM

## 2022-02-05 DIAGNOSIS — M79674 Pain in right toe(s): Secondary | ICD-10-CM

## 2022-02-05 DIAGNOSIS — M79675 Pain in left toe(s): Secondary | ICD-10-CM

## 2022-02-05 NOTE — Progress Notes (Signed)
Patient ID: Thomas Marshall, male   DOB: 15-Jan-1941, 81 y.o.   MRN: [id number] ? ?Subjective: ?81 y.o. returns the office today for painful, elongated, thickened toenails which he cannot trim himself and for calluses on the right foot.  Calluses of not becoming too tender or thick.  No open sores or reports.  No swelling redness or drainage.   ? ?He is on Eliquis  ?Last A1c was 6.7 on 12/26/2021 ? ?[name], MD ?Last seen 10/15/2021 ? ? ?Objective: ?AAO ?3, NAD ?DP/PT pulses palpable, CRT less than 3 seconds ?Protective sensation decreased with Thomas Marshall monofilament ?Nails hypertrophic, dystrophic, elongated, brittle, discolored ?10. There is tenderness overlying the nails 1-5 bilaterally. There is no surrounding erythema or drainage along the nail sites. ?There is minimal hyperkeratotic lesion right fifth metatarsal base and fifth metatarsal head. No ulceration, drainage or any signs of infection.  ?No pain with calf compression, swelling, warmth, erythema. ? ?Assessment: ?Patient presents with symptomatic onychomycosis; hyperkeratotic tissue ? ?Plan: ?-Treatment options including alternatives, risks, complications were discussed ?-Nails sharply debrided ?10 without complication/bleeding. ?-Lightly debrided the hyperkeratotic tissue right submetatarsal 5 without any complications or bleeding.  Continue moisturizer and offloading. ?-Discussed daily foot inspection. If there are any changes, to call the office immediately.  ?-Follow-up in 3 months or sooner if any problems are to arise. In the meantime, encouraged to call the office with any questions, concerns, changes symptoms. ? ?Celesta Gentile, DPM ? ? ?

## 2022-02-19 ENCOUNTER — Other Ambulatory Visit: Payer: Self-pay

## 2022-02-19 ENCOUNTER — Ambulatory Visit: Payer: Medicare Other

## 2022-02-19 DIAGNOSIS — E1140 Type 2 diabetes mellitus with diabetic neuropathy, unspecified: Secondary | ICD-10-CM

## 2022-02-19 DIAGNOSIS — L84 Corns and callosities: Secondary | ICD-10-CM

## 2022-02-19 NOTE — Progress Notes (Signed)
SITUATION ?Reason for Consult: Evaluation for Prefabricated Diabetic Shoes and Custom Diabetic Inserts. ?Patient / Caregiver Report: Patient would like well fitting shoes ? ?OBJECTIVE DATA: ?Patient History / Diagnosis:  ?  ICD-10-CM   ?1. Type 2 diabetes, controlled, with neuropathy ([place])  E11.40   ?  ?2. Pre-ulcerative calluses  L84   ?  ? ?Physician Treating Diabetes:  [name], MD ? ?Current or Previous Devices:   Current user - Apex shoes and custom insoles ? ?In-Person Foot Examination: ?Ulcers & Callousing:   Right 5th met and base ?Deformities:    Pes Cavus, hammertoes ?Sensation:    Compromised  ?Shoe Size:     11.5W ? ?ORTHOTIC RECOMMENDATION ?Recommended Devices: ?- 1x pair prefabricated PDAC approved diabetic shoes; Patient Selected Orthofeet Sprint 675 Black Size 12.5XW per patient request ?- 3x pair custom-to-patient PDAC approved vacuum formed diabetic insoles. ? ?GOALS OF SHOES AND INSOLES ?- Reduce shear and pressure ?- Reduce / Prevent callus formation ?- Reduce / Prevent ulceration ?- Protect the fragile healing compromised diabetic foot. ? ?Patient would benefit from diabetic shoes and inserts as patient has diabetes mellitus and the patient has one or more of the following conditions: ?- History of pre-ulcerative callus ?- Peripheral neuropathy with evidence of callus formation ?- Foot deformity ?- Poor circulation ? ?ACTIONS PERFORMED ?Potential out of pocket cost was communicated to patient. Patient understood and consented to measurement and casting. Patient was casted for insoles via crush box and measured for shoes via brannock device. Procedure was explained and patient tolerated procedure well. All questions were answered and concerns addressed. Casts were shipped to central fabrication for HOLD until Certificate of Medical Necessity or otherwise necessary authorization from insurance is obtained. ? ?PLAN ?Shoes are to be ordered and casts released from hold once all appropriate  paperwork is complete. Patient is to be contacted and scheduled for fitting once shoes and insoles have been fabricated and received. ? ?

## 2022-03-05 ENCOUNTER — Telehealth: Payer: Self-pay | Admitting: *Deleted

## 2022-03-05 NOTE — Chronic Care Management (AMB) (Signed)
?  Care Management  ? ?Note ? ?03/05/2022 ?Name: Thomas Marshall MRN: [id number] DOB: 06-11-1941 ? ?[name] is a 81 y.o. year old male who is a primary care patient of [name], MD. I reached out to [employer] by phone today offer care coordination services.  ? ?Mr. Selbe was given information about care management services today including:  ?Care management services include personalized support from designated clinical staff supervised by his physician, including individualized plan of care and coordination with other care providers ?24/7 contact phone numbers for assistance for urgent and routine care needs. ?The patient may stop care management services at any time by phone call to the office staff. ? ?Patient did not agree to enrollment in care management services and does not wish to consider at this time. ? ?Follow up plan: ?Patient declines engagement by the care management team. Appropriate care team members and provider have been notified via electronic communication. The care management team is available to follow up with the patient after provider conversation with the patient regarding recommendation for care management engagement and subsequent re-referral to the care management team.  ? ?Laverda Sorenson  ?Care Guide, Embedded Care Coordination ?[place]  Care Management  ?Direct Dial: [phone] ? ?

## 2022-03-09 ENCOUNTER — Telehealth: Payer: Medicare Other

## 2022-03-16 ENCOUNTER — Ambulatory Visit (INDEPENDENT_AMBULATORY_CARE_PROVIDER_SITE_OTHER): Payer: Medicare Other

## 2022-03-16 DIAGNOSIS — I495 Sick sinus syndrome: Secondary | ICD-10-CM

## 2022-03-17 DIAGNOSIS — N2581 Secondary hyperparathyroidism of renal origin: Secondary | ICD-10-CM | POA: Diagnosis not present

## 2022-03-17 DIAGNOSIS — I4811 Longstanding persistent atrial fibrillation: Secondary | ICD-10-CM | POA: Diagnosis not present

## 2022-03-17 DIAGNOSIS — D631 Anemia in chronic kidney disease: Secondary | ICD-10-CM | POA: Diagnosis not present

## 2022-03-17 DIAGNOSIS — D696 Thrombocytopenia, unspecified: Secondary | ICD-10-CM | POA: Diagnosis not present

## 2022-03-17 DIAGNOSIS — I129 Hypertensive chronic kidney disease with stage 1 through stage 4 chronic kidney disease, or unspecified chronic kidney disease: Secondary | ICD-10-CM | POA: Diagnosis not present

## 2022-03-17 DIAGNOSIS — E1122 Type 2 diabetes mellitus with diabetic chronic kidney disease: Secondary | ICD-10-CM | POA: Diagnosis not present

## 2022-03-17 DIAGNOSIS — N1832 Chronic kidney disease, stage 3b: Secondary | ICD-10-CM | POA: Diagnosis not present

## 2022-03-17 DIAGNOSIS — N189 Chronic kidney disease, unspecified: Secondary | ICD-10-CM | POA: Diagnosis not present

## 2022-03-17 DIAGNOSIS — R809 Proteinuria, unspecified: Secondary | ICD-10-CM | POA: Diagnosis not present

## 2022-03-17 LAB — CUP PACEART REMOTE DEVICE CHECK
Battery Remaining Longevity: 124 mo
Battery Remaining Percentage: 87 %
Battery Voltage: 3.02 V
Brady Statistic RV Percent Paced: 1 %
Date Time Interrogation Session: 20230417020012
Implantable Lead Implant Date: 20210415
Implantable Lead Implant Date: 20210415
Implantable Lead Location: 753859
Implantable Lead Location: 753860
Implantable Pulse Generator Implant Date: 20210415
Lead Channel Impedance Value: 560 Ohm
Lead Channel Pacing Threshold Amplitude: 0.5 V
Lead Channel Pacing Threshold Pulse Width: 0.5 ms
Lead Channel Sensing Intrinsic Amplitude: 12 mV
Lead Channel Setting Pacing Amplitude: 2.5 V
Lead Channel Setting Pacing Pulse Width: 0.5 ms
Lead Channel Setting Sensing Sensitivity: 2 mV
Pulse Gen Model: 2272
Pulse Gen Serial Number: 3813712

## 2022-03-23 DIAGNOSIS — N183 Chronic kidney disease, stage 3 unspecified: Secondary | ICD-10-CM | POA: Diagnosis not present

## 2022-04-01 NOTE — Progress Notes (Signed)
Remote pacemaker transmission.   

## 2022-04-15 ENCOUNTER — Ambulatory Visit (INDEPENDENT_AMBULATORY_CARE_PROVIDER_SITE_OTHER): Payer: Medicare Other | Admitting: Internal Medicine

## 2022-04-15 ENCOUNTER — Encounter: Payer: Self-pay | Admitting: Internal Medicine

## 2022-04-15 VITALS — BP 124/80 | HR 78 | Temp 97.8°F | Ht 74.0 in | Wt 211.6 lb

## 2022-04-15 DIAGNOSIS — I10 Essential (primary) hypertension: Secondary | ICD-10-CM | POA: Diagnosis not present

## 2022-04-15 DIAGNOSIS — E1122 Type 2 diabetes mellitus with diabetic chronic kidney disease: Secondary | ICD-10-CM

## 2022-04-15 DIAGNOSIS — D61818 Other pancytopenia: Secondary | ICD-10-CM | POA: Diagnosis not present

## 2022-04-15 DIAGNOSIS — N1832 Chronic kidney disease, stage 3b: Secondary | ICD-10-CM

## 2022-04-15 DIAGNOSIS — I6522 Occlusion and stenosis of left carotid artery: Secondary | ICD-10-CM

## 2022-04-15 DIAGNOSIS — H9319 Tinnitus, unspecified ear: Secondary | ICD-10-CM | POA: Diagnosis not present

## 2022-04-15 DIAGNOSIS — E559 Vitamin D deficiency, unspecified: Secondary | ICD-10-CM

## 2022-04-15 NOTE — Patient Instructions (Signed)
Please continue all other medications as before, and refills have been done if requested. ? ?Please have the pharmacy call with any other refills you may need. ? ?Please keep your appointments with your specialists as you may have planned ? ?Please call if you would want the MRI for the brain, for ENT, or follow up blood tests before your next visit ? ?Please make an Appointment to return in 6 months, or sooner if needed ?

## 2022-04-15 NOTE — Progress Notes (Signed)
Patient ID: Thomas Marshall, male   DOB: [birth year], 81 y.o.   MRN: [id number]        Chief Complaint: follow up chronic tinnitus, dm, low platelets       HPI:  Thomas Marshall is a 81 y.o. male here with hx of chronic tinnitus x 24 yrs and wife more concerned about this recently after reading on the internet; Pt denies chest pain, increased sob or doe, wheezing, orthopnea, PND, increased LE swelling, palpitations, dizziness or syncope.   Pt denies polydipsia, polyuria, or new focal neuro s/s.    Pt denies fever, wt loss, night sweats, loss of appetite, or other constitutional symptoms  No overt bleeding or bruising.  No other new complaints       Wt Readings from Last 3 Encounters:  04/15/22 211 lb 9.6 oz (96 kg)  01/29/22 212 lb 4.8 oz (96.3 kg)  01/20/22 214 lb (97.1 kg)   BP Readings from Last 3 Encounters:  04/15/22 124/80  01/29/22 121/69  01/20/22 124/64         Past Medical History:  Diagnosis Date   AAA (abdominal aortic aneurysm) ([place])    Arthritis    Cancer ([place])    skin & squamous cell   Carotid artery disease ([place])    a. s/p R CEA 12/2015.   CKD (chronic kidney disease), stage III ([place])    a. per historical labs.   Coronary artery disease    a. s/p CABG 2004.   Diabetes mellitus without complication (HCC)    GERD (gastroesophageal reflux disease)    Hyperlipidemia    Hypertension    Medication intolerance Multiple    PAF (paroxysmal atrial fibrillation) (HCC)    Pneumonia    Presence of permanent cardiac pacemaker    Sinus bradycardia    a. baseline HR 50s-60s, also h/o bradycardia on metoprolol and carvedilol   Past Surgical History:  Procedure Laterality Date   ANKLE SURGERY     right fused   BACK SURGERY     low back    CARDIOVERSION N/A 02/14/2020   Procedure: CARDIOVERSION;  Surgeon: Geralynn Rile, MD;  Location: [facility];  Service: Cardiovascular;  Laterality: N/A;   CARDIOVERSION N/A 02/26/2020   Procedure: CARDIOVERSION;  Surgeon: [name], MD;  Location[facility] ENDOSCOPY;  Service: Cardiovascular;  Laterality: N/A;   CARDIOVERSION N/A 04/12/2020   Procedure: CARDIOVERSION;  Surgeon: Skeet Latch, MD;  Location: [facility] ENDOSCOPY;  Service: Cardiovascular;  Laterality: N/A;   CHOLECYSTECTOMY N/A 12/07/2013   Procedure: LAPAROSCOPIC CHOLECYSTECTOMY ;  Surgeon: [name], MD;  Location: [facility] OR;  Service: General;  Laterality: N/A;   COLONOSCOPY     CORONARY ARTERY BYPASS GRAFT  2004   [place] N/A 05/21/2017   Procedure: Coronary Stent Intervention;  Surgeon: [name], MD;  Location: [place] CV LAB;  Service: Cardiovascular;  Laterality: N/A;   ENDARTERECTOMY Right 12/10/2015   Procedure: Right Carotid ENDARTERECTOMY;  Surgeon: Conrad Richwood, MD;  Location: [place];  Service: Vascular;  Laterality: Right;   ENDARTERECTOMY Left 12/30/2021   Procedure: LEFT CAROTID ENDARTERECTOMY;  Surgeon: Angelia Mould, MD;  Location: [facility] OR;  Service: Vascular;  Laterality: Left;   EXPLORATION POST OPERATIVE OPEN HEART     FINGER SURGERY     skin graft on rt index   FINGER SURGERY Right    right index finger.   HERNIA REPAIR Right    RIH   JOINT  REPLACEMENT     KNEE SURGERY     replacement on both knees   LEFT HEART CATH AND CORS/GRAFTS ANGIOGRAPHY N/A 05/21/2017   Procedure: Left Heart Cath and Cors/Grafts Angiography;  Surgeon: [name], MD;  Location: [place] CV LAB;  Service: Cardiovascular;  Laterality: N/A;   LEFT HEART CATH AND CORS/GRAFTS ANGIOGRAPHY N/A 05/20/2020   Procedure: LEFT HEART CATH AND CORS/GRAFTS ANGIOGRAPHY;  Surgeon: [name], MD;  Location: [place] CV LAB;  Service: Cardiovascular;  Laterality: N/A;   PACEMAKER IMPLANT N/A 03/14/2020   Procedure: PACEMAKER IMPLANT;  Surgeon: Evans Lance, MD;  Location: [place] CV LAB;  Service: Cardiovascular;  Laterality: N/A;   PROSTATE SURGERY     PVC ABLATION N/A 10/27/2017   Procedure: PVC ABLATION;   Surgeon: Evans Lance, MD;  Location: [place] CV LAB;  Service: Cardiovascular;  Laterality: N/A;   TONSILLECTOMY     URINARY SURGERY     scar tissue    reports that he quit smoking about 51 years ago. His smoking use included cigarettes. He has a 13.00 pack-year smoking history. He has never used smokeless tobacco. He reports that he does not drink alcohol and does not use drugs. family history includes Cancer in his sister; Diabetes in his brother and sister; Heart disease in his brother, father, and mother; Hyperlipidemia in his brother and father; Hypertension in his brother, father, and sister; Stroke in his mother. Allergies  Allergen Reactions   Lisinopril Swelling and Other (See Comments)    Angioedema.    Losartan Swelling and Other (See Comments)    Lips swell Angioedema    Nifedipine Swelling and Other (See Comments)    Sugar increase   Triamterene Swelling and Other (See Comments)    Face swells, no breathing impairment   Hydralazine Hcl Other (See Comments)    Fatigue; poor appetite   Losartan Potassium-Hctz Swelling and Other (See Comments)   Amoxicillin Rash and Other (See Comments)    Did it involve swelling of the face/tongue/throat, SOB, or low BP? No Did it involve sudden or severe rash/hives, skin peeling, or any reaction on the inside of your mouth or nose? No Did you need to seek medical attention at a hospital or doctor's office? No When did it last happen?      10 + years If all above answers are "NO", may proceed with cephalosporin use.     Ampicillin Rash and Other (See Comments)    Did it involve swelling of the face/tongue/throat, SOB, or low BP? No Did it involve sudden or severe rash/hives, skin peeling, or any reaction on the inside of your mouth or nose? No Did you need to seek medical attention at a hospital or doctor's office? No When did it last happen?      10 + years If all above answers are "NO", may proceed with cephalosporin use.     Carvedilol Other (See Comments)    Low heart rate (in higher doses)   Current Outpatient Medications on File Prior to Visit  Medication Sig Dispense Refill   ACCU-CHEK GUIDE test strip USE 1 NEW TEST STRIP ONCE DAILY TO TEST BLOOD SUGAR 50 each 0   Accu-Chek Softclix Lancets lancets USE 1 TWICE DAILY USE AS DIRECTED E11.9 100 each 3   apixaban (ELIQUIS) 2.5 MG TABS tablet Take 1 tablet (2.5 mg total) by mouth 2 (two) times daily. (0800 & 2000) 180 tablet 3   aspirin EC 81 MG tablet Take  1 tablet (81 mg total) by mouth daily. 90 tablet 3   atorvastatin (LIPITOR) 40 MG tablet Take 40 mg by mouth at bedtime.      carvedilol (COREG) 6.25 MG tablet Take 1 tablet (6.25 mg total) by mouth 2 (two) times daily. 180 tablet 3   cholecalciferol (VITAMIN D3) 25 MCG (1000 UNIT) tablet Take 1,000 Units by mouth every evening.     glucose blood test strip Accu-Check Guide Strips. Dx E11.9 Use to test blood glucose level 1 time a day. 100 each 11   hydrALAZINE (APRESOLINE) 10 MG tablet Take 10 mg by mouth 3 (three) times daily as needed (elevated bp greater than 160).     magnesium oxide (MAG-OX) 400 MG tablet Take 800 mg by mouth 2 (two) times daily.      meclizine (ANTIVERT) 25 MG tablet Take 1 tablet (25 mg total) by mouth 3 (three) times daily as needed for dizziness. 30 tablet 0   nitroGLYCERIN (NITROSTAT) 0.4 MG SL tablet Place 1 tablet (0.4 mg total) under the tongue every 5 (five) minutes as needed for chest pain (x 3 doses). 30 tablet 3   pantoprazole (PROTONIX) 40 MG tablet Take 1 tablet (40 mg total) by mouth daily. 90 tablet 3   terazosin (HYTRIN) 1 MG capsule Take 1 mg by mouth at bedtime.  30 capsule 11   vitamin C (ASCORBIC ACID) 500 MG tablet Take 500 mg by mouth daily with breakfast.      Zinc 22.5 MG TABS Take 22.5 mg by mouth 2 (two) times a week.     No current facility-administered medications on file prior to visit.        ROS:  All others reviewed and negative.  Objective        PE:   BP 124/80 (BP Location: Right Arm, Patient Position: Sitting, Cuff Size: Large)   Pulse 78   Temp 97.8 F (36.6 C) (Oral)   Ht '6\' 2"'$  (1.88 m)   Wt 211 lb 9.6 oz (96 kg)   SpO2 99%   BMI 27.17 kg/m                 Constitutional: Pt appears in NAD               HENT: Head: NCAT.                Right Ear: External ear normal.                 Left Ear: External ear normal.                Eyes: . Pupils are equal, round, and reactive to light. Conjunctivae and EOM are normal               Nose: without d/c or deformity               Neck: Neck supple. Gross normal ROM               Cardiovascular: Normal rate and regular rhythm.                 Pulmonary/Chest: Effort normal and breath sounds without rales or wheezing.                Abd:  Soft, NT, ND, + BS, no organomegaly               Neurological: Pt is alert. At baseline orientation, motor grossly intact  Skin: Skin is warm. No rashes, no other new lesions, LE edema - none               Psychiatric: Pt behavior is normal without agitation   Micro: none  Cardiac tracings I have personally interpreted today:  none  Pertinent Radiological findings (summarize): none   Lab Results  Component Value Date   WBC 7.1 12/31/2021   HGB 11.1 (L) 12/31/2021   HCT 33.0 (L) 12/31/2021   PLT 84 (L) 12/31/2021   GLUCOSE 144 (H) 12/31/2021   CHOL 103 12/31/2021   TRIG 59 12/31/2021   HDL 37 (L) 12/31/2021   LDLDIRECT 67.0 09/16/2016   LDLCALC 54 12/31/2021   ALT 22 12/26/2021   AST 21 12/26/2021   NA 138 12/31/2021   K 4.6 12/31/2021   CL 108 12/31/2021   CREATININE 1.99 (H) 12/31/2021   BUN 37 (H) 12/31/2021   CO2 24 12/31/2021   TSH 1.59 10/15/2021   PSA 1.09 03/31/2018   INR 1.2 12/26/2021   HGBA1C 6.7 (H) 12/26/2021   MICROALBUR 6.2 (H) 10/15/2021   Assessment/Plan:  Thomas Marshall is a 81 y.o. White or Caucasian [1] male with  has a past medical history of AAA (abdominal aortic aneurysm) ([place]), Arthritis,  Cancer ([place]), Carotid artery disease ([place]), CKD (chronic kidney disease), stage III ([place]), Coronary artery disease, Diabetes mellitus without complication ([place]), GERD (gastroesophageal reflux disease), Hyperlipidemia, Hypertension, Medication intolerance ([place] ), PAF (paroxysmal atrial fibrillation) ([place]), Pneumonia, Presence of permanent cardiac pacemaker, and Sinus bradycardia.  Tinnitus Chronic recurrent, exam benign, continue to follow  Pancytopenia ([place]) Chronic stable,   Lab Results  Component Value Date   WBC 7.1 12/31/2021   HGB 11.1 (L) 12/31/2021   HCT 33.0 (L) 12/31/2021   MCV 98.5 12/31/2021   PLT 84 (L) 12/31/2021   Declines f/u lab for now,  to f/u any worsening symptoms or concerns  Essential hypertension BP Readings from Last 3 Encounters:  04/15/22 124/80  01/29/22 121/69  01/20/22 124/64   Stable, pt to continue medical treatment coreg, hydralzine   CKD (chronic kidney disease) stage 3, GFR 30-59 ml/min (HCC) Lab Results  Component Value Date   CREATININE 1.99 (H) 12/31/2021   Stable overall, cont to avoid nephrotoxins  Vitamin D deficiency Last vitamin D Lab Results  Component Value Date   VD25OH 59.77 10/15/2021   Stable, cont oral replacement   Type 2 diabetes mellitus with renal manifestations ([place]) Lab Results  Component Value Date   HGBA1C 6.7 (H) 12/26/2021   Stable, pt to continue current medical treatment   - diet  Followup: Return in about 6 months (around 10/16/2022).  Cathlean Cower, MD 04/18/2022 8:06 PM [facility]

## 2022-04-18 ENCOUNTER — Encounter: Payer: Self-pay | Admitting: Internal Medicine

## 2022-04-18 DIAGNOSIS — H9319 Tinnitus, unspecified ear: Secondary | ICD-10-CM | POA: Insufficient documentation

## 2022-04-18 NOTE — Assessment & Plan Note (Signed)
BP Readings from Last 3 Encounters:  04/15/22 124/80  01/29/22 121/69  01/20/22 124/64   Stable, pt to continue medical treatment coreg, hydralzine

## 2022-04-18 NOTE — Assessment & Plan Note (Signed)
Lab Results  Component Value Date   HGBA1C 6.7 (H) 12/26/2021   Stable, pt to continue current medical treatment   - diet

## 2022-04-18 NOTE — Assessment & Plan Note (Signed)
Chronic stable,   Lab Results  Component Value Date   WBC 7.1 12/31/2021   HGB 11.1 (L) 12/31/2021   HCT 33.0 (L) 12/31/2021   MCV 98.5 12/31/2021   PLT 84 (L) 12/31/2021   Declines f/u lab for now,  to f/u any worsening symptoms or concerns

## 2022-04-18 NOTE — Assessment & Plan Note (Signed)
Last vitamin D Lab Results  Component Value Date   VD25OH 59.77 10/15/2021   Stable, cont oral replacement

## 2022-04-18 NOTE — Assessment & Plan Note (Signed)
Lab Results  Component Value Date   CREATININE 1.99 (H) 12/31/2021   Stable overall, cont to avoid nephrotoxins

## 2022-04-18 NOTE — Assessment & Plan Note (Signed)
Chronic recurrent, exam benign, continue to follow

## 2022-04-20 DIAGNOSIS — L821 Other seborrheic keratosis: Secondary | ICD-10-CM | POA: Diagnosis not present

## 2022-04-20 DIAGNOSIS — L578 Other skin changes due to chronic exposure to nonionizing radiation: Secondary | ICD-10-CM | POA: Diagnosis not present

## 2022-04-20 DIAGNOSIS — L570 Actinic keratosis: Secondary | ICD-10-CM | POA: Diagnosis not present

## 2022-05-07 ENCOUNTER — Telehealth: Payer: Self-pay

## 2022-05-07 NOTE — Telephone Encounter (Signed)
Cmn resubmitted

## 2022-05-08 ENCOUNTER — Ambulatory Visit: Payer: Medicare Other | Admitting: Podiatry

## 2022-05-14 ENCOUNTER — Telehealth: Payer: Self-pay

## 2022-05-14 ENCOUNTER — Ambulatory Visit (INDEPENDENT_AMBULATORY_CARE_PROVIDER_SITE_OTHER): Payer: Medicare Other | Admitting: Podiatry

## 2022-05-14 DIAGNOSIS — E1140 Type 2 diabetes mellitus with diabetic neuropathy, unspecified: Secondary | ICD-10-CM

## 2022-05-14 DIAGNOSIS — M79675 Pain in left toe(s): Secondary | ICD-10-CM | POA: Diagnosis not present

## 2022-05-14 DIAGNOSIS — M79674 Pain in right toe(s): Secondary | ICD-10-CM

## 2022-05-14 DIAGNOSIS — B351 Tinea unguium: Secondary | ICD-10-CM

## 2022-05-14 NOTE — Telephone Encounter (Signed)
CMN Received - Shoes ordered and casts released from fabrication hold.  

## 2022-05-16 NOTE — Progress Notes (Signed)
Patient ID: Thomas Marshall, male   DOB: 10-29-1941, 81 y.o.   MRN: [id number]  Subjective: 81 y.o. returns the office today for painful, elongated, thickened toenails which he cannot trim himself and for calluses on the right foot.  Calluses of not becoming too tender or thick.  No open sores or reports.  No swelling redness or drainage.    He is on Eliquis  Last A1c was 6.7 on 12/26/2021  Thomas Borg, MD Last seen Apr 15, 2022   Objective: AAO 3, NAD DP/PT pulses palpable, CRT less than 3 seconds Protective sensation decreased with Thomas Marshall monofilament Nails hypertrophic, dystrophic, elongated, brittle, discolored 10. There is tenderness overlying the nails 1-5 bilaterally. There is no surrounding erythema or drainage along the nail sites. There is no significant hyperkeratotic lesion right fifth metatarsal base and fifth metatarsal head. No ulceration, drainage or any signs of infection.  No pain with calf compression, swelling, warmth, erythema.  Assessment: Patient presents with symptomatic onychomycosis; hyperkeratotic tissue  Plan: -Treatment options including alternatives, risks, complications were discussed -Nails sharply debrided 10 without complication/bleeding. -No significant hyperkeratotic tissue to debride today.  Continue moisturizer, offloading. -Discussed daily foot inspection. If there are any changes, to call the office immediately.  -Follow-up in 3 months or sooner if any problems are to arise. In the meantime, encouraged to call the office with any questions, concerns, changes symptoms.  Thomas Marshall, DPM

## 2022-06-14 NOTE — Progress Notes (Unsigned)
Cardiology Office Note:    Date:  06/15/2022   ID:  Thomas Marshall, DOB March 21, 1941, MRN [id number]  PCP:  [name], MD   [place] Providers Cardiologist:  [name], MD Electrophysiologist:  Cristopher Peru, MD     Referring MD: [name], MD   Chief Complaint: 6 mo follow-up CAD  History of Present Illness:    Thomas Marshall is a 81 y.o. male with a hx of CAD s/p CABG x 4 2004, symptomatic PVCs s/p ablation 2018 with marked reduction in PVCs from 25% to 7%, PPM, PAF on chronic anticoagulation, carotid artery disease s/p Rt endarterectomy, > 80% left carotid stenosis, DM, HTN, hyperlipidemia, CKD, and small AAA.   Most recent cardiac catheterization revealed patent RIMA to PDA, LIMA to LAD, SVG to diagnonal, SVG to OM, severe native CAD w/total occlusion of left main and total occlusion of RCA, no targets for intervention. Most recent echocardiogram done at [facility] on 04/23/20 revealed mild to mod concentric LVH, nl LVEF at 62%,septal motion consistent w/post-operative state, diastolic function not well evaluated due to arrhythmia, moderately dilated LA, mildly dilated RA, mild to mod aortic sclerosis without significant stenosis, mild to mod mitral regurgitation, mild pulmonary hypertension, trace TR.  In Feb. 2022 he developed recurrent atrial fib despite dofetilide. He was placed on amiodarone and converted to NSR, it was held and he reverted back to a fib with RVR, Very symptomatic with fatigue in a fib.   He was last seen on 10/15/21 by [name], PA. In an effort to better control his HR and atrial fibrillation, his carvedilol was increased to 6.25 mg twice daily.   I saw him on 12/08/21 at which time time he was feeling well. Scheduled office visit for preoperative cardiac evaluation. Heart rate at home ranged from high 50s to 80 since increasing his carvedilol to 6.25 twice daily. Reported tolerating dose of carvedilol without adverse effect. Home blood pressure  readings consistently less than 130/80. He is here for cardiac evaluation for upcoming surgery. He denied chest pain, lower extremity edema, palpitations, melena, hematuria, hemoptysis, diaphoresis, weakness, presyncope, syncope, orthopnea, and PND. Persistent dyspnea when in a fib and stated "I not feel like doing anything."  Reported shortness of breath and fatigue stable for some time. He enjoys doing yard work but is limited to doing just a little at a time before needing to rest.  He helps with housework including vacuuming. Per Dr. Scot Dock note from 12/04/21, patient needs  cardiac clearance for left carotid endarterectomy, hold Eliquis 48 hours, continue aspirin and statin preoperatively. He denies specific concerns today. Had echo 12/10/21 which revealed LVEF 55-60%, no rwma, unable to evaluate diastolic function, normal RV, mild MR, moderate calcification of AV, mild aortic stenosis, and increase in size of ascending aortic dilatation from 39 mm to 44 mm. We ordered chest CT without contrast for further evaluation of aortic dilatation which revealed thoracic aorta is normal in caliber without aneurysm.  Additionally, he saw Dr. Burt Knack on 12/16/2021 he was cleared for vascular surgery.  Dr. Burt Knack advised him to follow-up in 6 months.  He underwent left CEA on 12/30/2021 by Dr. Scot Dock.  He was seen by [name] on 01/20/2022 for EP follow-up.  No changes were made to treatment regimen and he was advised to follow-up in 1 year.  Today, he is here with his wife for follow-up. Continues to have fatigue which she has had since onset of atrial fibrillation.  He  denies chest pain, shortness of breath, lower extremity edema, fatigue, palpitations, melena, hematuria, hemoptysis, diaphoresis, weakness, presyncope, syncope, orthopnea, and PND.  He does not have any specific concerns today.  Past Medical History:  Diagnosis Date   AAA (abdominal aortic aneurysm) ([place])    Arthritis    Cancer ([place])    skin &  squamous cell   Carotid artery disease ([place])    a. s/p R CEA 12/2015.   CKD (chronic kidney disease), stage III ([place])    a. per historical labs.   Coronary artery disease    a. s/p CABG 2004.   Diabetes mellitus without complication (HCC)    GERD (gastroesophageal reflux disease)    Hyperlipidemia    Hypertension    Medication intolerance Multiple    PAF (paroxysmal atrial fibrillation) (HCC)    Pneumonia    Presence of permanent cardiac pacemaker    Sinus bradycardia    a. baseline HR 50s-60s, also h/o bradycardia on metoprolol and carvedilol    Past Surgical History:  Procedure Laterality Date   ANKLE SURGERY     right fused   BACK SURGERY     low back    CARDIOVERSION N/A 02/14/2020   Procedure: CARDIOVERSION;  Surgeon: Geralynn Rile, MD;  Location: [place];  Service: Cardiovascular;  Laterality: N/A;   CARDIOVERSION N/A 02/26/2020   Procedure: CARDIOVERSION;  Surgeon: [name], MD;  Location: [facility] ENDOSCOPY;  Service: Cardiovascular;  Laterality: N/A;   CARDIOVERSION N/A 04/12/2020   Procedure: CARDIOVERSION;  Surgeon: Skeet Latch, MD;  Location: [facility] ENDOSCOPY;  Service: Cardiovascular;  Laterality: N/A;   CHOLECYSTECTOMY N/A 12/07/2013   Procedure: LAPAROSCOPIC CHOLECYSTECTOMY ;  Surgeon: [name], MD;  Location: [facility] OR;  Service: General;  Laterality: N/A;   COLONOSCOPY     CORONARY ARTERY BYPASS GRAFT  2004   [place] N/A 05/21/2017   Procedure: Coronary Stent Intervention;  Surgeon: [name], MD;  Location: [place] CV LAB;  Service: Cardiovascular;  Laterality: N/A;   ENDARTERECTOMY Right 12/10/2015   Procedure: Right Carotid ENDARTERECTOMY;  Surgeon: Conrad Sewaren, MD;  Location: [employer];  Service: Vascular;  Laterality: Right;   ENDARTERECTOMY Left 12/30/2021   Procedure: LEFT CAROTID ENDARTERECTOMY;  Surgeon: Angelia Mould, MD;  Location: [facility] OR;  Service: Vascular;  Laterality: Left;    EXPLORATION POST OPERATIVE OPEN HEART     FINGER SURGERY     skin graft on rt index   FINGER SURGERY Right    right index finger.   HERNIA REPAIR Right    RIH   JOINT REPLACEMENT     KNEE SURGERY     replacement on both knees   LEFT HEART CATH AND CORS/GRAFTS ANGIOGRAPHY N/A 05/21/2017   Procedure: Left Heart Cath and Cors/Grafts Angiography;  Surgeon: [name], MD;  Location: [place] CV LAB;  Service: Cardiovascular;  Laterality: N/A;   LEFT HEART CATH AND CORS/GRAFTS ANGIOGRAPHY N/A 05/20/2020   Procedure: LEFT HEART CATH AND CORS/GRAFTS ANGIOGRAPHY;  Surgeon: [name], MD;  Location: [place] CV LAB;  Service: Cardiovascular;  Laterality: N/A;   PACEMAKER IMPLANT N/A 03/14/2020   Procedure: PACEMAKER IMPLANT;  Surgeon: Evans Lance, MD;  Location: [place] CV LAB;  Service: Cardiovascular;  Laterality: N/A;   PROSTATE SURGERY     PVC ABLATION N/A 10/27/2017   Procedure: PVC ABLATION;  Surgeon: Evans Lance, MD;  Location: [place] CV LAB;  Service: Cardiovascular;  Laterality: N/A;  TONSILLECTOMY     URINARY SURGERY     scar tissue    Current Medications: Current Meds  Medication Sig   ACCU-CHEK GUIDE test strip USE 1 NEW TEST STRIP ONCE DAILY TO TEST BLOOD SUGAR   Accu-Chek Softclix Lancets lancets USE 1 TWICE DAILY USE AS DIRECTED E11.9   apixaban (ELIQUIS) 2.5 MG TABS tablet Take 1 tablet (2.5 mg total) by mouth 2 (two) times daily. (0800 & 2000)   aspirin EC 81 MG tablet Take 1 tablet (81 mg total) by mouth daily.   atorvastatin (LIPITOR) 40 MG tablet Take 40 mg by mouth at bedtime.    carvedilol (COREG) 6.25 MG tablet Take 1 tablet (6.25 mg total) by mouth 2 (two) times daily.   cholecalciferol (VITAMIN D3) 25 MCG (1000 UNIT) tablet Take 1,000 Units by mouth every evening.   glucose blood test strip Accu-Check Guide Strips. Dx E11.9 Use to test blood glucose level 1 time a day.   hydrALAZINE (APRESOLINE) 10 MG tablet Take 10 mg by mouth 3  (three) times daily as needed (elevated bp greater than 160).   magnesium oxide (MAG-OX) 400 MG tablet Take 800 mg by mouth 2 (two) times daily.    meclizine (ANTIVERT) 25 MG tablet Take 1 tablet (25 mg total) by mouth 3 (three) times daily as needed for dizziness.   Multiple Vitamins-Minerals (MENS 50+ MULTI VITAMIN/MIN PO) Take by mouth daily at 6 (six) AM.   nitroGLYCERIN (NITROSTAT) 0.4 MG SL tablet Place 1 tablet (0.4 mg total) under the tongue every 5 (five) minutes as needed for chest pain (x 3 doses).   pantoprazole (PROTONIX) 40 MG tablet Take 1 tablet (40 mg total) by mouth daily.   terazosin (HYTRIN) 1 MG capsule Take 1 mg by mouth at bedtime.    vitamin C (ASCORBIC ACID) 500 MG tablet Take 500 mg by mouth daily with breakfast.    Zinc 22.5 MG TABS Take 22.5 mg by mouth 2 (two) times a week.     Allergies:   Lisinopril, Losartan, Nifedipine, Triamterene, Hydralazine hcl, Losartan potassium-hctz, Amoxicillin, Ampicillin, and Carvedilol   Social History   Socioeconomic History   Marital status: Married    Spouse name: Not on file   Number of children: 2   Years of education: Not on file   Highest education level: Not on file  Occupational History   Occupation: retired  Tobacco Use   Smoking status: Former    Packs/day: 1.00    Years: 13.00    Total pack years: 13.00    Types: Cigarettes    Quit date: 11/30/1970    Years since quitting: 51.5   Smokeless tobacco: Never  Vaping Use   Vaping Use: Never used  Substance and Sexual Activity   Alcohol use: No    Alcohol/week: 0.0 standard drinks of alcohol   Drug use: No   Sexual activity: Yes  Other Topics Concern   Not on file  Social History Narrative   Not on file   Social Determinants of Health   Financial Resource Strain: Low Risk  (10/12/2018)   Overall Financial Resource Strain (CARDIA)    Difficulty of Paying Living Expenses: Not hard at all  Food Insecurity: No Food Insecurity (10/12/2018)   Hunger Vital  Sign    Worried About Running Out of Food in the Last Year: Never true    [place] in the Last Year: Never true  Transportation Needs: No Transportation Needs (10/12/2018)   PRAPARE -  Hydrologist (Medical): No    Lack of Transportation (Non-Medical): No  Physical Activity: Sufficiently Active (10/16/2019)   Exercise Vital Sign    Days of Exercise per Week: 4 days    Minutes of Exercise per Session: 40 min  Stress: No Stress Concern Present (10/12/2018)   [place]    Feeling of Stress : Not at all  Social Connections: [place] (10/12/2018)   Social Connection and Isolation Panel [NHANES]    Frequency of Communication with Friends and Family: More than three times a week    Frequency of Social Gatherings with Friends and Family: More than three times a week    Attends Religious Services: More than 4 times per year    Active Member of [employer] or Organizations: Yes    Attends Music therapist: More than 4 times per year    Marital Status: Married     Family History: The patient's family history includes Cancer in his sister; Diabetes in his brother and sister; Heart disease in his brother, father, and mother; Hyperlipidemia in his brother and father; Hypertension in his brother, father, and sister; Stroke in his mother. There is no history of Early death.  ROS:   Please see the history of present illness.    + dyspnea on exertion All other systems reviewed and are negative.  Labs/Other Studies Reviewed:    The following studies were reviewed today:  CT Chest w/o contrast 12/11/21  The thoracic aorta is normal in caliber without aneurysm.   No acute findings in the chest.   Echo 12/10/21   1. Left ventricular ejection fraction, by estimation, is 55 to 60%. The  left ventricle has normal function. The left ventricle has no regional  wall motion  abnormalities. Left ventricular diastolic function could not  be evaluated.   2. Right ventricular systolic function is normal. The right ventricular  size is normal. There is normal pulmonary artery systolic pressure. The  estimated right ventricular systolic pressure is 34.7 mmHg.   3. Left atrial size was severely dilated.   4. The mitral valve is degenerative. Mild mitral valve regurgitation.  Moderate mitral annular calcification.   5. The aortic valve is tricuspid. There is moderate calcification of the  aortic valve. There is moderate thickening of the aortic valve. Aortic  valve regurgitation is not visualized. Mild aortic valve stenosis. Aortic  valve area, by VTI measures 1.77  cm. Aortic valve mean gradient measures 5.0 mmHg. Aortic valve Vmax  measures 1.67 m/s.   6. Aortic dilatation noted. There is mild dilatation of the aortic root,  measuring 41 mm. There is severe dilatation of the ascending aorta,  measuring 44 mm.   7. The inferior vena cava is normal in size with greater than 50%  respiratory variability, suggesting right atrial pressure of 3 mmHg.   Carotid duplex 11/20/21  Right Carotid: Velocities in the right ICA are consistent with a 1-39%  stenosis.  Left Carotid: Velocities in the left ICA are consistent with a 80-99%  stenosis.  Vertebrals: Bilateral vertebral arteries demonstrate antegrade flow.  Subclavians: Normal flow hemodynamics were seen in bilateral subclavian               arteries.   AAA Duplex 5/22  Abdominal Aorta Findings:  +-----------+-------+----------+----------+--------+--------+--------+  Location  AP (cm)Trans (cm)PSV (cm/s)WaveformThrombusComments  +-----------+-------+----------+----------+--------+--------+--------+  Proximal  2.23   2.46      45                                  +-----------+-------+----------+----------+--------+--------+--------+  Mid       3.44   3.54      37                                   +-----------+-------+----------+----------+--------+--------+--------+  Distal    2.28   2.53      39                                  +-----------+-------+----------+----------+--------+--------+--------+  RT CIA Prox1.5    1.5       65                                  +-----------+-------+----------+----------+--------+--------+--------+  LT CIA Prox1.4    1.3       64             Summary:  Abdominal Aorta: There is evidence of abnormal dilatation of the mid  Abdominal aorta. The largest aortic diameter has increased compared to  prior exam. Previous diameter measurement was 3.0 x 3.2 cm obtained on  02/02/18.   LHC 05/20/20  1.  Severe native vessel coronary artery disease with total occlusion of the left main and total occlusion of the RCA 2.  Status post aortocoronary bypass surgery with continued patency of the RIMA to PDA, LIMA to LAD, saphenous vein graft to diagonal, and saphenous vein graft OM   Recommendations: Ongoing medical therapy.  No targets for intervention.  Note there is a moderately severe stenosis at the distal anastomosis of the saphenous vein graft OM, likely a mechanical issue that has been present since the time of surgery.  This is unchanged over time and there is brisk TIMI-3 flow, very unlikely to be related to the patient's symptoms.   Recent Labs: 10/15/2021: TSH 1.59 12/26/2021: ALT 22 12/31/2021: BUN 37; Creatinine, Ser 1.99; Hemoglobin 11.1; Platelets 84; Potassium 4.6; Sodium 138  Recent Lipid Panel    Component Value Date/Time   CHOL 103 12/31/2021 0500   TRIG 59 12/31/2021 0500   HDL 37 (L) 12/31/2021 0500   CHOLHDL 2.8 12/31/2021 0500   VLDL 12 12/31/2021 0500   LDLCALC 54 12/31/2021 0500   LDLDIRECT 67.0 09/16/2016 1455     Risk Assessment/Calculations:    CHA2DS2-VASc Score = 4  This indicates a 4.8% annual risk of stroke. The patient's score is based upon: CHF History: 0 HTN History: 1 Diabetes History:  0 Stroke History: 0 Vascular Disease History: 1 Age Score: 2 Gender Score: 0    Physical Exam:    VS:  BP 120/68   Pulse 80   Ht '6\' 2"'$  (1.88 m)   Wt 212 lb (96.2 kg)   SpO2 98%   BMI 27.22 kg/m     Wt Readings from Last 3 Encounters:  06/15/22 212 lb (96.2 kg)  04/15/22 211 lb 9.6 oz (96 kg)  01/29/22 212 lb 4.8 oz (96.3 kg)     GEN:  Well nourished, well developed in no acute distress HEENT: Normal NECK: No JVD; No carotid bruits LYMPHATICS: No lymphadenopathy CARDIAC: Irregular RR, no murmurs, rubs, gallops RESPIRATORY:  Clear to auscultation without rales, wheezing or rhonchi  ABDOMEN: Soft, non-tender, non-distended MUSCULOSKELETAL:  No edema; No deformity  SKIN: Warm and dry NEUROLOGIC:  Alert and oriented x 3 PSYCHIATRIC:  Normal  affect   EKG:  EKG is not ordered today  Diagnoses:    1. Stenosis of left carotid artery   2. Coronary artery disease involving native coronary artery of native heart without angina pectoris   3. Hyperlipidemia LDL goal <70   4. CKD (chronic kidney disease) stage 4, GFR 15-29 ml/min (HCC)   5. Persistent atrial fibrillation ([place])   6. Chronic anticoagulation   7. Aneurysm of ascending aorta without rupture ([place])   8. S/P CABG x 4 2004   9. Aortic stenosis, mild   10. Essential hypertension     Assessment and Plan:     Aortic stenosis:  Moderate thickening of the aortic valve with mild AS by echo 12/10/21.  He is asymptomatic. Continue to follow with echo in 2-3 years, sooner if clinically indicated.   CAD without angina/s/p CABG 2004: Most recent cath 2021 with no targets for intervention. He denies chest pain, dyspnea, or other symptoms concerning for angina. No indication for further ischemic evaluation at this time.  Encouraged him to walk for exercise as often as possible. Continue aspirin, atorvastatin, hydralazine, carvedilol.  Carotid artery stenosis: S/p left CEA 11/2021, s/p right CEA 2017. Continue regular follow-up  with [facility].  Continue aspirin, statin.  Essential hypertension: BP  Blood pressures well controlled today. No medication changes today.   CKD Stage III: Has an appoitment with nephrololgy in September (every 4 months). Most recent lab work done at [place] was stable per patient.  A fib on chronic anticoagulation: Symptom is fatigue  He is in A. fib today with well-controlled ventricular rate. Does not exercise on a consistent basis. Encouraged regular exercise. No bleeding concerns. Continue carvedilol, Eliquis.   Hyperlipidemia LDL goal < 70: LDL 54 on 12/31/21.  Continue atorvastatin.  AAA: Abnormal dilatation of mid abdominal aorta slightly increased from previous study by vascular duplex  04/23/2021. Continue good BP control. Plan per [facility].   Disposition: 6 months with Dr. Burt Knack    Medication Adjustments/Labs and Tests Ordered: Current medicines are reviewed at length with the patient today.  Concerns regarding medicines are outlined above.  No orders of the defined types were placed in this encounter.  No orders of the defined types were placed in this encounter.   Patient Instructions  Medication Instructions:   Your physician recommends that you continue on your current medications as directed. Please refer to the Current Medication list given to you today.   *If you need a refill on your cardiac medications before your next appointment, please call your pharmacy*   Lab Work:  None ordered.  If you have labs (blood work) drawn today and your tests are completely normal, you will receive your results only by: [place] (if you have MyChart) OR A paper copy in the mail If you have any lab test that is abnormal or we need to change your treatment, we will call you to review the results.   Testing/Procedures:  None ordered.   Follow-Up: At [facility], you and your health needs are our priority.  As part of our continuing mission to provide you with exceptional heart  care, we have created designated Provider Care Teams.  These Care Teams include your primary Cardiologist (physician) and Advanced Practice Providers (APPs -  Physician Assistants and Nurse Practitioners) who all work together to provide you with the care you need, when you need it.  We recommend signing up for the patient portal called "MyChart".  Sign up information is provided on  this After Visit Summary.  MyChart is used to connect with patients for Virtual Visits (Telemedicine).  Patients are able to view lab/test results, encounter notes, upcoming appointments, etc.  Non-urgent messages can be sent to your provider as well.   To learn more about what you can do with MyChart, go to [email or web address].    Your next appointment:   6 month(s)  The format for your next appointment:   In Person  Provider:   [name], MD     Other Instructions  Your physician wants you to follow-up in: 6 months with Dr. Burt Knack.  You will receive a reminder letter in the mail two months in advance. If you don't receive a letter, please call our office to schedule the follow-up appointment.   Important Information About Sugar         Signed, Emmaline Life, NP  06/15/2022 8:18 PM    [place]

## 2022-06-15 ENCOUNTER — Encounter: Payer: Self-pay | Admitting: Nurse Practitioner

## 2022-06-15 ENCOUNTER — Ambulatory Visit (INDEPENDENT_AMBULATORY_CARE_PROVIDER_SITE_OTHER): Payer: Medicare Other

## 2022-06-15 ENCOUNTER — Ambulatory Visit (INDEPENDENT_AMBULATORY_CARE_PROVIDER_SITE_OTHER): Payer: Medicare Other | Admitting: Nurse Practitioner

## 2022-06-15 VITALS — BP 120/68 | HR 80 | Ht 74.0 in | Wt 212.0 lb

## 2022-06-15 DIAGNOSIS — I10 Essential (primary) hypertension: Secondary | ICD-10-CM | POA: Diagnosis not present

## 2022-06-15 DIAGNOSIS — I350 Nonrheumatic aortic (valve) stenosis: Secondary | ICD-10-CM | POA: Diagnosis not present

## 2022-06-15 DIAGNOSIS — I4819 Other persistent atrial fibrillation: Secondary | ICD-10-CM

## 2022-06-15 DIAGNOSIS — Z7901 Long term (current) use of anticoagulants: Secondary | ICD-10-CM | POA: Diagnosis not present

## 2022-06-15 DIAGNOSIS — N184 Chronic kidney disease, stage 4 (severe): Secondary | ICD-10-CM

## 2022-06-15 DIAGNOSIS — I7121 Aneurysm of the ascending aorta, without rupture: Secondary | ICD-10-CM | POA: Diagnosis not present

## 2022-06-15 DIAGNOSIS — I6522 Occlusion and stenosis of left carotid artery: Secondary | ICD-10-CM | POA: Diagnosis not present

## 2022-06-15 DIAGNOSIS — E785 Hyperlipidemia, unspecified: Secondary | ICD-10-CM

## 2022-06-15 DIAGNOSIS — I2510 Atherosclerotic heart disease of native coronary artery without angina pectoris: Secondary | ICD-10-CM

## 2022-06-15 DIAGNOSIS — I495 Sick sinus syndrome: Secondary | ICD-10-CM

## 2022-06-15 DIAGNOSIS — Z951 Presence of aortocoronary bypass graft: Secondary | ICD-10-CM

## 2022-06-15 LAB — CUP PACEART REMOTE DEVICE CHECK
Battery Remaining Longevity: 122 mo
Battery Remaining Percentage: 85 %
Battery Voltage: 3.02 V
Brady Statistic RV Percent Paced: 1 %
Date Time Interrogation Session: 20230717020030
Implantable Lead Implant Date: 20210415
Implantable Lead Implant Date: 20210415
Implantable Lead Location: 753859
Implantable Lead Location: 753860
Implantable Pulse Generator Implant Date: 20210415
Lead Channel Impedance Value: 560 Ohm
Lead Channel Pacing Threshold Amplitude: 0.5 V
Lead Channel Pacing Threshold Pulse Width: 0.5 ms
Lead Channel Sensing Intrinsic Amplitude: 12 mV
Lead Channel Setting Pacing Amplitude: 2.5 V
Lead Channel Setting Pacing Pulse Width: 0.5 ms
Lead Channel Setting Sensing Sensitivity: 2 mV
Pulse Gen Model: 2272
Pulse Gen Serial Number: 3813712

## 2022-06-15 NOTE — Patient Instructions (Signed)
Medication Instructions:   Your physician recommends that you continue on your current medications as directed. Please refer to the Current Medication list given to you today.   *If you need a refill on your cardiac medications before your next appointment, please call your pharmacy*   Lab Work:  None ordered.  If you have labs (blood work) drawn today and your tests are completely normal, you will receive your results only by: [place] (if you have MyChart) OR A paper copy in the mail If you have any lab test that is abnormal or we need to change your treatment, we will call you to review the results.   Testing/Procedures:  None ordered.   Follow-Up: At [facility], you and your health needs are our priority.  As part of our continuing mission to provide you with exceptional heart care, we have created designated Provider Care Teams.  These Care Teams include your primary Cardiologist (physician) and Advanced Practice Providers (APPs -  Physician Assistants and Nurse Practitioners) who all work together to provide you with the care you need, when you need it.  We recommend signing up for the patient portal called "MyChart".  Sign up information is provided on this After Visit Summary.  MyChart is used to connect with patients for Virtual Visits (Telemedicine).  Patients are able to view lab/test results, encounter notes, upcoming appointments, etc.  Non-urgent messages can be sent to your provider as well.   To learn more about what you can do with MyChart, go to [email or web address].    Your next appointment:   6 month(s)  The format for your next appointment:   In Person  Provider:   [name], MD     Other Instructions  Your physician wants you to follow-up in: 6 months with Dr. Burt Knack.  You will receive a reminder letter in the mail two months in advance. If you don't receive a letter, please call our office to schedule the follow-up  appointment.   Important Information About Sugar

## 2022-06-19 DIAGNOSIS — D485 Neoplasm of uncertain behavior of skin: Secondary | ICD-10-CM | POA: Diagnosis not present

## 2022-06-26 ENCOUNTER — Ambulatory Visit: Payer: Medicare Other

## 2022-07-13 ENCOUNTER — Other Ambulatory Visit: Payer: Medicare Other

## 2022-07-14 DIAGNOSIS — C44329 Squamous cell carcinoma of skin of other parts of face: Secondary | ICD-10-CM | POA: Diagnosis not present

## 2022-07-15 ENCOUNTER — Ambulatory Visit (INDEPENDENT_AMBULATORY_CARE_PROVIDER_SITE_OTHER): Payer: Medicare Other

## 2022-07-15 DIAGNOSIS — E1140 Type 2 diabetes mellitus with diabetic neuropathy, unspecified: Secondary | ICD-10-CM

## 2022-07-15 NOTE — Progress Notes (Signed)
Patient presents to the office today with issues concerning the diabetic shoes picked up on 07/15/22.   The shoes feel too big.  We will send the orthofeet brand, style sprint, size 13 x-wide back.   Reorder: Apex V751 11.5 wide men's     Patient will be notified for a fitting appointment once the shoes arrive in office.

## 2022-07-17 NOTE — Progress Notes (Signed)
Remote pacemaker transmission.   

## 2022-07-20 DIAGNOSIS — L570 Actinic keratosis: Secondary | ICD-10-CM | POA: Diagnosis not present

## 2022-07-20 DIAGNOSIS — C44329 Squamous cell carcinoma of skin of other parts of face: Secondary | ICD-10-CM | POA: Diagnosis not present

## 2022-07-28 ENCOUNTER — Encounter: Payer: Self-pay | Admitting: Podiatry

## 2022-07-30 DIAGNOSIS — I129 Hypertensive chronic kidney disease with stage 1 through stage 4 chronic kidney disease, or unspecified chronic kidney disease: Secondary | ICD-10-CM | POA: Diagnosis not present

## 2022-07-30 DIAGNOSIS — N189 Chronic kidney disease, unspecified: Secondary | ICD-10-CM | POA: Diagnosis not present

## 2022-07-30 DIAGNOSIS — I4811 Longstanding persistent atrial fibrillation: Secondary | ICD-10-CM | POA: Diagnosis not present

## 2022-07-30 DIAGNOSIS — E1122 Type 2 diabetes mellitus with diabetic chronic kidney disease: Secondary | ICD-10-CM | POA: Diagnosis not present

## 2022-07-30 DIAGNOSIS — D696 Thrombocytopenia, unspecified: Secondary | ICD-10-CM | POA: Diagnosis not present

## 2022-07-30 DIAGNOSIS — N1832 Chronic kidney disease, stage 3b: Secondary | ICD-10-CM | POA: Diagnosis not present

## 2022-07-30 DIAGNOSIS — N2581 Secondary hyperparathyroidism of renal origin: Secondary | ICD-10-CM | POA: Diagnosis not present

## 2022-07-30 DIAGNOSIS — D631 Anemia in chronic kidney disease: Secondary | ICD-10-CM | POA: Diagnosis not present

## 2022-07-30 DIAGNOSIS — R809 Proteinuria, unspecified: Secondary | ICD-10-CM | POA: Diagnosis not present

## 2022-08-12 ENCOUNTER — Ambulatory Visit (INDEPENDENT_AMBULATORY_CARE_PROVIDER_SITE_OTHER): Payer: Medicare Other

## 2022-08-12 VITALS — Ht 74.0 in | Wt 205.0 lb

## 2022-08-12 DIAGNOSIS — Z0000 Encounter for general adult medical examination without abnormal findings: Secondary | ICD-10-CM | POA: Diagnosis not present

## 2022-08-12 NOTE — Patient Instructions (Signed)
[name] , Thank you for taking time to come for your Medicare Wellness Visit. I appreciate your ongoing commitment to your health goals. Please review the following plan we discussed and let me know if I can assist you in the future.   Screening recommendations/referrals: Colonoscopy: Discontinued due to age Recommended yearly ophthalmology/optometry visit for glaucoma screening and checkup Recommended yearly dental visit for hygiene and checkup  Vaccinations: Influenza vaccine: due Fall 2023 Pneumococcal vaccine: 09/10/2014, 05/22/2016 Tdap vaccine: 04/03/2014; due every 10 years Shingles vaccine: never done   Covid-19: 12/20/2019, 01/08/2020, 11/26/2020  Advanced directives: Yes; Please bring a copy of your health care power of attorney and living will to the office at your convenience.  Conditions/risks identified: Yes  Next appointment: Please schedule your next Medicare Wellness Visit with your Nurse Health Advisor in 1 year by calling [phone].  Preventive Care 22 Years and Older, Male Preventive care refers to lifestyle choices and visits with your health care provider that can promote health and wellness. What does preventive care include? A yearly physical exam. This is also called an annual well check. Dental exams once or twice a year. Routine eye exams. Ask your health care provider how often you should have your eyes checked. Personal lifestyle choices, including: Daily care of your teeth and gums. Regular physical activity. Eating a healthy diet. Avoiding tobacco and drug use. Limiting alcohol use. Practicing safe sex. Taking low doses of aspirin every day. Taking vitamin and mineral supplements as recommended by your health care provider. What happens during an annual well check? The services and screenings done by your health care provider during your annual well check will depend on your age, overall health, lifestyle risk factors, and family history of  disease. Counseling  Your health care provider may ask you questions about your: Alcohol use. Tobacco use. Drug use. Emotional well-being. Home and relationship well-being. Sexual activity. Eating habits. History of falls. Memory and ability to understand (cognition). Work and work Statistician. Screening  You may have the following tests or measurements: Height, weight, and BMI. Blood pressure. Lipid and cholesterol levels. These may be checked every 5 years, or more frequently if you are over 85 years old. Skin check. Lung cancer screening. You may have this screening every year starting at age 56 if you have a 30-pack-year history of smoking and currently smoke or have quit within the past 15 years. Fecal occult blood test (FOBT) of the stool. You may have this test every year starting at age 12. Flexible sigmoidoscopy or colonoscopy. You may have a sigmoidoscopy every 5 years or a colonoscopy every 10 years starting at age 6. Prostate cancer screening. Recommendations will vary depending on your family history and other risks. Hepatitis C blood test. Hepatitis B blood test. Sexually transmitted disease (STD) testing. Diabetes screening. This is done by checking your blood sugar (glucose) after you have not eaten for a while (fasting). You may have this done every 1-3 years. Abdominal aortic aneurysm (AAA) screening. You may need this if you are a current or former smoker. Osteoporosis. You may be screened starting at age 34 if you are at high risk. Talk with your health care provider about your test results, treatment options, and if necessary, the need for more tests. Vaccines  Your health care provider may recommend certain vaccines, such as: Influenza vaccine. This is recommended every year. Tetanus, diphtheria, and acellular pertussis (Tdap, Td) vaccine. You may need a Td booster every 10 years. Zoster vaccine. You may  need this after age 2. Pneumococcal 13-valent  conjugate (PCV13) vaccine. One dose is recommended after age 48. Pneumococcal polysaccharide (PPSV23) vaccine. One dose is recommended after age 55. Talk to your health care provider about which screenings and vaccines you need and how often you need them. This information is not intended to replace advice given to you by your health care provider. Make sure you discuss any questions you have with your health care provider. Document Released: 12/13/2015 Document Revised: 08/05/2016 Document Reviewed: 09/17/2015 Elsevier Interactive Patient Education  2017 [place] Prevention in the Home Falls can cause injuries. They can happen to people of all ages. There are many things you can do to make your home safe and to help prevent falls. What can I do on the outside of my home? Regularly fix the edges of walkways and driveways and fix any cracks. Remove anything that might make you trip as you walk through a door, such as a raised step or threshold. Trim any bushes or trees on the path to your home. Use bright outdoor lighting. Clear any walking paths of anything that might make someone trip, such as rocks or tools. Regularly check to see if handrails are loose or broken. Make sure that both sides of any steps have handrails. Any raised decks and porches should have guardrails on the edges. Have any leaves, snow, or ice cleared regularly. Use sand or salt on walking paths during winter. Clean up any spills in your garage right away. This includes oil or grease spills. What can I do in the bathroom? Use night lights. Install grab bars by the toilet and in the tub and shower. Do not use towel bars as grab bars. Use non-skid mats or decals in the tub or shower. If you need to sit down in the shower, use a plastic, non-slip stool. Keep the floor dry. Clean up any water that spills on the floor as soon as it happens. Remove soap buildup in the tub or shower regularly. Attach bath mats  securely with double-sided non-slip rug tape. Do not have throw rugs and other things on the floor that can make you trip. What can I do in the bedroom? Use night lights. Make sure that you have a light by your bed that is easy to reach. Do not use any sheets or blankets that are too big for your bed. They should not hang down onto the floor. Have a firm chair that has side arms. You can use this for support while you get dressed. Do not have throw rugs and other things on the floor that can make you trip. What can I do in the kitchen? Clean up any spills right away. Avoid walking on wet floors. Keep items that you use a lot in easy-to-reach places. If you need to reach something above you, use a strong step stool that has a grab bar. Keep electrical cords out of the way. Do not use floor polish or wax that makes floors slippery. If you must use wax, use non-skid floor wax. Do not have throw rugs and other things on the floor that can make you trip. What can I do with my stairs? Do not leave any items on the stairs. Make sure that there are handrails on both sides of the stairs and use them. Fix handrails that are broken or loose. Make sure that handrails are as long as the stairways. Check any carpeting to make sure that it is firmly attached  to the stairs. Fix any carpet that is loose or worn. Avoid having throw rugs at the top or bottom of the stairs. If you do have throw rugs, attach them to the floor with carpet tape. Make sure that you have a light switch at the top of the stairs and the bottom of the stairs. If you do not have them, ask someone to add them for you. What else can I do to help prevent falls? Wear shoes that: Do not have high heels. Have rubber bottoms. Are comfortable and fit you well. Are closed at the toe. Do not wear sandals. If you use a stepladder: Make sure that it is fully opened. Do not climb a closed stepladder. Make sure that both sides of the stepladder  are locked into place. Ask someone to hold it for you, if possible. Clearly mark and make sure that you can see: Any grab bars or handrails. First and last steps. Where the edge of each step is. Use tools that help you move around (mobility aids) if they are needed. These include: Canes. Walkers. Scooters. Crutches. Turn on the lights when you go into a dark area. Replace any light bulbs as soon as they burn out. Set up your furniture so you have a clear path. Avoid moving your furniture around. If any of your floors are uneven, fix them. If there are any pets around you, be aware of where they are. Review your medicines with your doctor. Some medicines can make you feel dizzy. This can increase your chance of falling. Ask your doctor what other things that you can do to help prevent falls. This information is not intended to replace advice given to you by your health care provider. Make sure you discuss any questions you have with your health care provider. Document Released: 09/12/2009 Document Revised: 04/23/2016 Document Reviewed: 12/21/2014 Elsevier Interactive Patient Education  2017 [employer].

## 2022-08-12 NOTE — Progress Notes (Signed)
Subjective:   Thomas Marshall is a 81 y.o. male who presents for Medicare Annual/Subsequent preventive examination.  Review of Systems    Virtual Visit via Telephone Note  I connected with  Thomas Marshall on 08/12/22 at  4:00 PM EDT by telephone and verified that I am speaking with the correct person using two identifiers.  Location: Patient: Home Provider: [place] Persons participating in the virtual visit: [place]   I discussed the limitations, risks, security and privacy concerns of performing an evaluation and management service by telephone and the availability of in person appointments. The patient expressed understanding and agreed to proceed.  Interactive audio and video telecommunications were attempted between this nurse and patient, however failed, due to patient having technical difficulties OR patient did not have access to video capability.  We continued and completed visit with audio only.  Some vital signs may be absent or patient reported.   Sheral Flow, LPN  Cardiac Risk Factors include: advanced age (>69mn, >>44women);diabetes mellitus;dyslipidemia;family history of premature cardiovascular disease;hypertension;male gender     Objective:    Today's Vitals   08/12/22 1632  Weight: 205 lb (93 kg)  Height: '6\' 2"'$  (1.88 m)   Body mass index is 26.32 kg/m.     08/12/2022    4:37 PM 12/26/2021   11:04 AM 05/20/2020    8:01 AM 04/11/2020    2:44 PM 03/14/2020    7:44 AM 02/26/2020    9:00 AM 02/14/2020    8:44 AM  Advanced Directives  Does Patient Have a Medical Advance Directive? Yes Yes Yes Yes Yes Yes Yes  Type of AParamedicof ADelcoLiving will [place]hel[place]Living will [place]Living will [place]Living will [place]k MountainLiving will [place]ubbockLiving will   Does patient want to make changes to medical advance  directive?   No - Patient declined  No - Patient declined    Copy of HFolsomin Chart? No - copy requested No - copy requested No - copy requested Yes - validated most recent copy scanned in chart (See row information) Yes - validated most recent copy scanned in chart (See row information) Yes - validated most recent copy scanned in chart (See row information)     Current Medications (verified) Outpatient Encounter Medications as of 08/12/2022  Medication Sig   ACCU-CHEK GUIDE test strip USE 1 NEW TEST STRIP ONCE DAILY TO TEST BLOOD SUGAR   Accu-Chek Softclix Lancets lancets USE 1 TWICE DAILY USE AS DIRECTED E11.9   apixaban (ELIQUIS) 2.5 MG TABS tablet Take 1 tablet (2.5 mg total) by mouth 2 (two) times daily. (0800 & 2000)   aspirin EC 81 MG tablet Take 1 tablet (81 mg total) by mouth daily.   atorvastatin (LIPITOR) 40 MG tablet Take 40 mg by mouth at bedtime.    carvedilol (COREG) 6.25 MG tablet Take 1 tablet (6.25 mg total) by mouth 2 (two) times daily.   cholecalciferol (VITAMIN D3) 25 MCG (1000 UNIT) tablet Take 1,000 Units by mouth every evening.   glucose blood test strip Accu-Check Guide Strips. Dx E11.9 Use to test blood glucose level 1 time a day.   hydrALAZINE (APRESOLINE) 10 MG tablet Take 10 mg by mouth 3 (three) times daily as needed (elevated bp greater than 160).   magnesium oxide (MAG-OX) 400 MG tablet Take 800 mg by mouth 2 (two) times daily.  meclizine (ANTIVERT) 25 MG tablet Take 1 tablet (25 mg total) by mouth 3 (three) times daily as needed for dizziness.   Multiple Vitamins-Minerals (MENS 50+ MULTI VITAMIN/MIN PO) Take by mouth daily at 6 (six) AM.   nitroGLYCERIN (NITROSTAT) 0.4 MG SL tablet Place 1 tablet (0.4 mg total) under the tongue every 5 (five) minutes as needed for chest pain (x 3 doses).   pantoprazole (PROTONIX) 40 MG tablet Take 1 tablet (40 mg total) by mouth daily.   terazosin (HYTRIN) 1 MG capsule Take 1 mg by mouth at bedtime.     vitamin C (ASCORBIC ACID) 500 MG tablet Take 500 mg by mouth daily with breakfast.    Zinc 22.5 MG TABS Take 22.5 mg by mouth 2 (two) times a week.   No facility-administered encounter medications on file as of 08/12/2022.    Allergies (verified) Lisinopril, Losartan, Nifedipine, Triamterene, Hydralazine hcl, Losartan potassium-hctz, Amoxicillin, Ampicillin, and Carvedilol   History: Past Medical History:  Diagnosis Date   AAA (abdominal aortic aneurysm) ([place])    Arthritis    Cancer ([place])    skin & squamous cell   Carotid artery disease ([place])    a. s/p R CEA 12/2015.   CKD (chronic kidney disease), stage III ([place])    a. per historical labs.   Coronary artery disease    a. s/p CABG 2004.   Diabetes mellitus without complication (HCC)    GERD (gastroesophageal reflux disease)    Hyperlipidemia    Hypertension    Medication intolerance Multiple    PAF (paroxysmal atrial fibrillation) (HCC)    Pneumonia    Presence of permanent cardiac pacemaker    Sinus bradycardia    a. baseline HR 50s-60s, also h/o bradycardia on metoprolol and carvedilol   Past Surgical History:  Procedure Laterality Date   ANKLE SURGERY     right fused   BACK SURGERY     low back    CARDIOVERSION N/A 02/14/2020   Procedure: CARDIOVERSION;  Surgeon: Geralynn Rile, MD;  Location: [place];  Service: Cardiovascular;  Laterality: N/A;   CARDIOVERSION N/A 02/26/2020   Procedure: CARDIOVERSION;  Surgeon: [name], MD;  Location: [facility] ENDOSCOPY;  Service: Cardiovascular;  Laterality: N/A;   CARDIOVERSION N/A 04/12/2020   Procedure: CARDIOVERSION;  Surgeon: Skeet Latch, MD;  Location: [facility] ENDOSCOPY;  Service: Cardiovascular;  Laterality: N/A;   CHOLECYSTECTOMY N/A 12/07/2013   Procedure: LAPAROSCOPIC CHOLECYSTECTOMY ;  Surgeon: [name], MD;  Location: [facility] OR;  Service: General;  Laterality: N/A;   COLONOSCOPY     CORONARY ARTERY BYPASS GRAFT  2004   [place] N/A 05/21/2017   Procedure: Coronary Stent Intervention;  Surgeon: [name], MD;  Location: [place] CV LAB;  Service: Cardiovascular;  Laterality: N/A;   ENDARTERECTOMY Right 12/10/2015   Procedure: Right Carotid ENDARTERECTOMY;  Surgeon: Conrad Zeigler, MD;  Location: [place];  Service: Vascular;  Laterality: Right;   ENDARTERECTOMY Left 12/30/2021   Procedure: LEFT CAROTID ENDARTERECTOMY;  Surgeon: Angelia Mould, MD;  Location: [facility] OR;  Service: Vascular;  Laterality: Left;   EXPLORATION POST OPERATIVE OPEN HEART     FINGER SURGERY     skin graft on rt index   FINGER SURGERY Right    right index finger.   HERNIA REPAIR Right    RIH   JOINT REPLACEMENT     KNEE SURGERY     replacement on both knees   LEFT HEART CATH AND  CORS/GRAFTS ANGIOGRAPHY N/A 05/21/2017   Procedure: Left Heart Cath and Cors/Grafts Angiography;  Surgeon: [name], MD;  Location: [place] CV LAB;  Service: Cardiovascular;  Laterality: N/A;   LEFT HEART CATH AND CORS/GRAFTS ANGIOGRAPHY N/A 05/20/2020   Procedure: LEFT HEART CATH AND CORS/GRAFTS ANGIOGRAPHY;  Surgeon: [name], MD;  Location: [place] CV LAB;  Service: Cardiovascular;  Laterality: N/A;   PACEMAKER IMPLANT N/A 03/14/2020   Procedure: PACEMAKER IMPLANT;  Surgeon: Evans Lance, MD;  Location: [place] CV LAB;  Service: Cardiovascular;  Laterality: N/A;   PROSTATE SURGERY     PVC ABLATION N/A 10/27/2017   Procedure: PVC ABLATION;  Surgeon: Evans Lance, MD;  Location: [place] CV LAB;  Service: Cardiovascular;  Laterality: N/A;   TONSILLECTOMY     URINARY SURGERY     scar tissue   Family History  Problem Relation Age of Onset   Stroke Mother    Heart disease Mother    Heart disease Father    Hypertension Father    Hyperlipidemia Father    Cancer Sister        breast  and skin   Diabetes Sister    Hypertension Sister    Diabetes Brother    Heart disease Brother    Hyperlipidemia Brother     Hypertension Brother    Early death Neg Hx    Social History   Socioeconomic History   Marital status: Married    Spouse name: Not on file   Number of children: 2   Years of education: Not on file   Highest education level: Not on file  Occupational History   Occupation: retired  Tobacco Use   Smoking status: Former    Packs/day: 1.00    Years: 13.00    Total pack years: 13.00    Types: Cigarettes    Quit date: 11/30/1970    Years since quitting: 51.7   Smokeless tobacco: Never  Vaping Use   Vaping Use: Never used  Substance and Sexual Activity   Alcohol use: No    Alcohol/week: 0.0 standard drinks of alcohol   Drug use: No   Sexual activity: Yes  Other Topics Concern   Not on file  Social History Narrative   Not on file   Social Determinants of Health   Financial Resource Strain: Low Risk  (08/12/2022)   Overall Financial Resource Strain (CARDIA)    Difficulty of Paying Living Expenses: Not hard at all  Food Insecurity: No Food Insecurity (08/12/2022)   Hunger Vital Sign    Worried About Running Out of Food in the Last Year: Never true    Ran Out of Food in the Last Year: Never true  Transportation Needs: No Transportation Needs (08/12/2022)   PRAPARE - Hydrologist (Medical): No    Lack of Transportation (Non-Medical): No  Physical Activity: Sufficiently Active (08/12/2022)   Exercise Vital Sign    Days of Exercise per Week: 4 days    Minutes of Exercise per Session: 40 min  Stress: No Stress Concern Present (08/12/2022)   [place]    Feeling of Stress : Not at all  Social Connections: [place] (08/12/2022)   Social Connection and Isolation Panel [NHANES]    Frequency of Communication with Friends and Family: More than three times a week    Frequency of Social Gatherings with Friends and Family: More than three times a week  Attends Religious Services: More  than 4 times per year    Active Member of Clubs or Organizations: Yes    Attends Archivist Meetings: More than 4 times per year    Marital Status: Married    Tobacco Counseling Counseling given: Not Answered   Clinical Intake:  Pre-visit preparation completed: Yes        BMI - recorded: 26.32 Nutritional Status: BMI 25 -29 Overweight Nutritional Risks: None Diabetes: Yes CBG done?: No Did pt. bring in CBG monitor from home?: No  How often do you need to have someone help you when you read instructions, pamphlets, or other written materials from your doctor or pharmacy?: 1 - Never What is the last grade level you completed in school?: HSG; [employer]  Diabetic? yeas  Interpreter Needed?: No  Information entered by :: Lisette Abu, LPN.   Activities of Daily Living    08/12/2022    4:37 PM 12/26/2021   11:06 AM  In your present state of health, do you have any difficulty performing the following activities:  Hearing? 0   Vision? 0   Difficulty concentrating or making decisions? 0   Walking or climbing stairs? 0   Dressing or bathing? 0   Doing errands, shopping? 0 0  Preparing Food and eating ? N   Using the Toilet? N   In the past six months, have you accidently leaked urine? N   Do you have problems with loss of bowel control? N   Managing your Medications? N   Managing your Finances? N   Housekeeping or managing your Housekeeping? N     Patient Care Team: [name], MD as PCP - General (Internal Medicine) Evans Lance, MD as PCP - Electrophysiology (Cardiology) [name], MD as PCP - Cardiology (Cardiology) [name], MD as Consulting Physician (Cardiology) [name], MD as Consulting Physician (General Surgery) Lyndal Pulley, DO as Consulting Physician (Family Medicine) Gardiner Barefoot, DPM as Consulting Physician (Podiatry)  Indicate any recent Medical Services you may have received from other than [facility]  providers in the past year (date may be approximate).     Assessment:   This is a routine wellness examination for [place].  Hearing/Vision screen Hearing Screening - Comments:: Patient wears hearing aids. Vision Screening - Comments:: Wears rx glasses - up to date with routine eye exams with [facility]   Dietary issues and exercise activities discussed: Current Exercise Habits: Home exercise routine, Type of exercise: walking, Time (Minutes): 40, Frequency (Times/Week): 4, Weekly Exercise (Minutes/Week): 160, Intensity: Moderate, Exercise limited by: cardiac condition(s)   Goals Addressed             This Visit's Progress    My goal is to maintain my health and stay active.        Depression Screen    08/12/2022    4:36 PM 04/16/2021    2:31 PM 04/16/2021    1:55 PM 04/15/2020    2:52 PM 10/16/2019    1:49 PM 04/18/2019   11:06 AM 10/12/2018    5:30 PM  PHQ 2/9 Scores  PHQ - 2 Score 0 0 0 0 0 0 0    Fall Risk    08/12/2022    4:37 PM 04/16/2021    2:31 PM 04/16/2021    1:55 PM 04/15/2020    2:51 PM 10/16/2019    2:54 PM  [place] in the past year? 0 0 0 0 0  Number  falls in past yr: 0 0 0  0  Injury with Fall? 0 0 0  0  Risk for fall due to : No Fall Risks      Follow up Falls prevention discussed        FALL RISK PREVENTION PERTAINING TO THE HOME:  Any stairs in or around the home? No  If so, are there any without handrails? No  Home free of loose throw rugs in walkways, pet beds, electrical cords, etc? Yes  Adequate lighting in your home to reduce risk of falls? Yes   ASSISTIVE DEVICES UTILIZED TO PREVENT FALLS:  Life alert? No  Use of a cane, walker or w/c? No  Grab bars in the bathroom? Yes  Shower chair or bench in shower? Yes  Elevated toilet seat or a handicapped toilet? Yes   TIMED UP AND GO:  Was the test performed? No .  Length of time to ambulate 10 feet: n/a sec.   Appearance of gait: Gait not evaluated during this  visit.  Cognitive Function:        08/12/2022    4:41 PM  6CIT Screen  What Year? 0 points  What month? 0 points  What time? 0 points  Count back from 20 0 points  Months in reverse 0 points  Repeat phrase 0 points  Total Score 0 points    Immunizations Immunization History  Administered Date(s) Administered   Fluad Quad(high Dose 65+) 07/26/2019, 08/30/2020, 08/09/2021   Influenza Split 10/13/2011   Influenza, High Dose Seasonal PF 08/17/2013, 09/08/2013, 07/31/2014, 08/31/2015, 07/31/2016, 07/22/2017, 08/30/2017, 08/04/2018   Influenza,inj,Quad PF,6+ Mos 08/20/2014, 08/13/2015, 07/31/2016   Influenza-Unspecified 09/29/2004, 09/23/2005, 11/30/2006, 07/31/2009, 08/22/2009, 08/23/2010, 08/31/2011, 10/28/2011, 08/08/2012, 08/31/2019, 07/31/2020, 11/30/2020, 07/31/2021   PFIZER(Purple Top)SARS-COV-2 Vaccination 12/20/2019, 01/08/2020, 11/26/2020   Pneumococcal Conjugate-13 09/10/2014   Pneumococcal Polysaccharide-23 10/30/2006, 05/22/2016   Pneumococcal-Unspecified 12/06/2003, 11/11/2006, 10/30/2008   Tdap 04/03/2014    TDAP status: Up to date  Flu Vaccine status: Due, Education has been provided regarding the importance of this vaccine. Advised may receive this vaccine at local pharmacy or Health Dept. Aware to provide a copy of the vaccination record if obtained from local pharmacy or Health Dept. Verbalized acceptance and understanding.  Pneumococcal vaccine status: Up to date  Covid-19 vaccine status: Completed vaccines  Qualifies for Shingles Vaccine? Yes   Zostavax completed Yes   Shingrix Completed?: No.    Education has been provided regarding the importance of this vaccine. Patient has been advised to call insurance company to determine out of pocket expense if they have not yet received this vaccine. Advised may also receive vaccine at local pharmacy or Health Dept. Verbalized acceptance and understanding.  Screening Tests Health Maintenance  Topic Date Due    Zoster Vaccines- Shingrix (1 of 2) Never done   COVID-19 Vaccine (4 - Pfizer series) 01/21/2021   FOOT EXAM  05/01/2021   INFLUENZA VACCINE  06/30/2022   Diabetic kidney evaluation - Urine ACR  10/15/2022   Diabetic kidney evaluation - GFR measurement  12/31/2022   OPHTHALMOLOGY EXAM  02/05/2023   TETANUS/TDAP  04/03/2024   Pneumonia Vaccine 54+ Years old  Completed   HPV VACCINES  Aged Out    Health Maintenance  Health Maintenance Due  Topic Date Due   Zoster Vaccines- Shingrix (1 of 2) Never done   COVID-19 Vaccine (4 - Pfizer series) 01/21/2021   FOOT EXAM  05/01/2021   INFLUENZA VACCINE  06/30/2022    Colorectal cancer screening: No longer  required.   Lung Cancer Screening: (Low Dose CT Chest recommended if Age 21-80 years, 30 pack-year currently smoking OR have quit w/in 15years.) does not qualify.   Lung Cancer Screening Referral: no  Additional Screening:  Hepatitis C Screening: does not qualify; Completed no  Vision Screening: Recommended annual ophthalmology exams for early detection of glaucoma and other disorders of the eye. Is the patient up to date with their annual eye exam?  Yes  Who is the provider or what is the name of the office in which the patient attends annual eye exams? [facility] If pt is not established with a provider, would they like to be referred to a provider to establish care? No .   Dental Screening: Recommended annual dental exams for proper oral hygiene  Community Resource Referral / Chronic Care Management: CRR required this visit?  No   CCM required this visit?  No      Plan:     I have personally reviewed and noted the following in the patient's chart:   Medical and social history Use of alcohol, tobacco or illicit drugs  Current medications and supplements including opioid prescriptions. Patient is not currently taking opioid prescriptions. Functional ability and status Nutritional status Physical activity Advanced  directives List of other physicians Hospitalizations, surgeries, and ER visits in previous 12 months Vitals Screenings to include cognitive, depression, and falls Referrals and appointments  In addition, I have reviewed and discussed with patient certain preventive protocols, quality metrics, and best practice recommendations. A written personalized care plan for preventive services as well as general preventive health recommendations were provided to patient.     Sheral Flow, LPN   [id number]/[id number]   Nurse Notes:  Patient provided weight ad height for this visit. There were no vitals filed for this visit. Patient stated that he has no issues with gait or balance; does not use any assistive devices.

## 2022-08-14 ENCOUNTER — Ambulatory Visit (INDEPENDENT_AMBULATORY_CARE_PROVIDER_SITE_OTHER): Payer: Medicare Other | Admitting: Podiatry

## 2022-08-14 DIAGNOSIS — E1140 Type 2 diabetes mellitus with diabetic neuropathy, unspecified: Secondary | ICD-10-CM

## 2022-08-14 DIAGNOSIS — B351 Tinea unguium: Secondary | ICD-10-CM | POA: Diagnosis not present

## 2022-08-14 DIAGNOSIS — Z7901 Long term (current) use of anticoagulants: Secondary | ICD-10-CM | POA: Diagnosis not present

## 2022-08-14 DIAGNOSIS — M79675 Pain in left toe(s): Secondary | ICD-10-CM

## 2022-08-14 DIAGNOSIS — M79674 Pain in right toe(s): Secondary | ICD-10-CM | POA: Diagnosis not present

## 2022-08-14 DIAGNOSIS — L84 Corns and callosities: Secondary | ICD-10-CM

## 2022-08-17 DIAGNOSIS — L570 Actinic keratosis: Secondary | ICD-10-CM | POA: Diagnosis not present

## 2022-08-17 DIAGNOSIS — C4442 Squamous cell carcinoma of skin of scalp and neck: Secondary | ICD-10-CM | POA: Diagnosis not present

## 2022-08-17 NOTE — Progress Notes (Signed)
Patient ID: Thomas Marshall, male   DOB: 1941/07/01, 81 y.o.   MRN: [id number]  Subjective:  Chief Complaint  Patient presents with   Diabetes    Diabetic foot care, A1c-  BG-140 Nail trim, callus     81 y.o. returns the office today for painful, elongated, thickened toenails which he cannot trim himself and for calluses on the right foot.  Calluses of not becoming too tender or thick.  No open sores or reports.  No swelling redness or drainage.    He is on Eliquis  Last A1c was 6.7 on 12/26/2021  [name], MD Last seen Apr 15, 2022   Objective: AAO 3, NAD DP/PT pulses palpable, CRT less than 3 seconds Protective sensation decreased with [name] monofilament Nails hypertrophic, dystrophic, elongated, brittle, discolored 10. There is tenderness overlying the nails 1-5 bilaterally. There is no surrounding erythema or drainage along the nail sites. There is hyperkeratotic lesion right fifth metatarsal base and fifth metatarsal head. No ulceration, drainage or any signs of infection.  No pain with calf compression, swelling, warmth, erythema.  Assessment: Patient presents with symptomatic onychomycosis; hyperkeratotic tissue  Plan: -Treatment options including alternatives, risks, complications were discussed -Nails sharply debrided 10 without complication/bleeding. -Sharply debrided the hyperkeratotic lesions x2 right foot any complications or bleeding. -Discussed daily foot inspection. If there are any changes, to call the office immediately.  -Follow-up in 3 months or sooner if any problems are to arise. In the meantime, encouraged to call the office with any questions, concerns, changes symptoms.  Celesta Gentile, DPM

## 2022-08-21 ENCOUNTER — Ambulatory Visit (INDEPENDENT_AMBULATORY_CARE_PROVIDER_SITE_OTHER): Payer: Medicare Other | Admitting: *Deleted

## 2022-08-21 DIAGNOSIS — Z23 Encounter for immunization: Secondary | ICD-10-CM

## 2022-08-31 ENCOUNTER — Other Ambulatory Visit: Payer: Self-pay | Admitting: Internal Medicine

## 2022-08-31 DIAGNOSIS — E1122 Type 2 diabetes mellitus with diabetic chronic kidney disease: Secondary | ICD-10-CM

## 2022-09-14 ENCOUNTER — Ambulatory Visit (INDEPENDENT_AMBULATORY_CARE_PROVIDER_SITE_OTHER): Payer: Medicare Other

## 2022-09-14 DIAGNOSIS — I495 Sick sinus syndrome: Secondary | ICD-10-CM

## 2022-09-14 LAB — CUP PACEART REMOTE DEVICE CHECK
Battery Remaining Longevity: 118 mo
Battery Remaining Percentage: 84 %
Battery Voltage: 3.02 V
Brady Statistic RV Percent Paced: 1 %
Date Time Interrogation Session: 20231016020014
Implantable Lead Implant Date: 20210415
Implantable Lead Implant Date: 20210415
Implantable Lead Location: 753859
Implantable Lead Location: 753860
Implantable Pulse Generator Implant Date: 20210415
Lead Channel Impedance Value: 490 Ohm
Lead Channel Pacing Threshold Amplitude: 0.5 V
Lead Channel Pacing Threshold Pulse Width: 0.5 ms
Lead Channel Sensing Intrinsic Amplitude: 9 mV
Lead Channel Setting Pacing Amplitude: 2.5 V
Lead Channel Setting Pacing Pulse Width: 0.5 ms
Lead Channel Setting Sensing Sensitivity: 2 mV
Pulse Gen Model: 2272
Pulse Gen Serial Number: 3813712

## 2022-09-25 ENCOUNTER — Telehealth: Payer: Self-pay | Admitting: *Deleted

## 2022-09-25 ENCOUNTER — Encounter: Payer: Self-pay | Admitting: *Deleted

## 2022-09-25 NOTE — Patient Outreach (Signed)
  Care Coordination   09/25/2022 Name: Thomas Marshall MRN: [id number] DOB: 1941-07-22   Care Coordination Outreach Attempts:  An unsuccessful telephone outreach was attempted today to offer the patient information about available care coordination services as a benefit of their health plan.   Follow Up Plan:  Additional outreach attempts will be made to offer the patient care coordination information and services.   Encounter Outcome:  No Answer left voice message requesting call back  Care Coordination Interventions Activated:  No   Care Coordination Interventions:  No, not indicated unsuccessful outreach attempt   Oneta Rack, RN, BSN, CCRN Alumnus RN CM Care Coordination/ Transition of [place] Management [phone]: direct office

## 2022-09-26 ENCOUNTER — Encounter: Payer: Self-pay | Admitting: Podiatry

## 2022-09-29 ENCOUNTER — Encounter: Payer: Self-pay | Admitting: *Deleted

## 2022-09-29 ENCOUNTER — Telehealth: Payer: Self-pay | Admitting: *Deleted

## 2022-09-29 NOTE — Patient Outreach (Signed)
  Care Coordination   Initial Visit Note   09/29/2022 Name: Thomas Marshall MRN: [id number] DOB: Nov 15, 1941  Thomas Marshall is a 81 y.o. year old male who sees [name], MD for primary care. I spoke with  Thomas Marshall by phone today.  What matters to the patients health and wellness today?  "I am doing just fine, no questions about my medications, I take them every day like I am supposed to.  I stay in A-Fib all the time, I can't tell when I am in A-Fib, but the heart doctor has said it's pretty much all the time; I have a pacemaker and am on a blood thinner, so I don't even know when I am having A-Fib, the main thing I feel is tiredness.  My blood sugars are fine at home, they usually run between 120-140 first thing in the morning before eating.  I try to eat as healthy as I can.  Dr. Jenny Reichmann is satisfied with my A1-C levels"  Interventions provided today; no further or ongoing care coordination needs identified today   Goals Addressed             This Visit's Progress    COMPLETED: Care Coordination Activities: No follow up required   On track    Care Coordination Interventions: Evaluation of current treatment plan related to AF; DM- diet controlled and patient's adherence to plan as established by provider Advised patient to continue monitoring blood sugars at home: reviewed recent fasting blood sugars at home: he reports consistent fasting values between 120-140, with rare values up to 160; confirmed patient has a good general understanding of A1-C significance/ value- reviewed historical A1-C trends with patient; confirmed patient obtained recent diabetic eye exam at [facility] Provided education to patient re: reinforced signs/ symptoms AF along with corresponding action plan for same Reviewed scheduled/upcoming provider appointments including 10/14/22- PCP; 11/13/22- podiatry Assessed social determinant of health barriers Confirmed patient obtained Medicare Annual Wellness visit on 08/12/22;  obtained flu vaccine for 2023-24 flu/ winter season          SDOH assessments and interventions completed:  Yes  SDOH Interventions Today    Flowsheet Row Most Recent Value  SDOH Interventions   Food Insecurity Interventions Intervention Not Indicated  Housing Interventions Intervention Not Indicated  Transportation Interventions Intervention Not Indicated  [patient drives self,  wife assists as indicated/ needed]       Care Coordination Interventions Activated:  Yes  Care Coordination Interventions:  Yes, provided   Follow up plan: No further intervention required.   Encounter Outcome:  Pt. Visit Completed   Oneta Rack, RN, BSN, CCRN Alumnus RN CM Care Coordination/ Transition of [place] Management [phone]: direct office

## 2022-09-29 NOTE — Patient Instructions (Signed)
Visit Information  Thank you for taking time to visit with me today. Please don't hesitate to contact me if I can be of assistance to you.   Following are the goals we discussed today:   Goals Addressed             This Visit's Progress    COMPLETED: Care Coordination Activities: No follow up required   On track    Care Coordination Interventions: Evaluation of current treatment plan related to AF; DM- diet controlled and patient's adherence to plan as established by provider Advised patient to continue monitoring blood sugars at home: reviewed recent fasting blood sugars at home: he reports consistent fasting values between 120-140, with rare values up to 160; confirmed patient has a good general understanding of A1-C significance/ value- reviewed historical A1-C trends with patient; confirmed patient obtained recent diabetic eye exam at the [place] Provided education to patient re: reinforced signs/ symptoms AF along with corresponding action plan for same Reviewed scheduled/upcoming provider appointments including 10/14/22- PCP; 11/13/22- podiatry Assessed social determinant of health barriers Confirmed patient obtained Medicare Annual Wellness visit on 08/12/22; obtained flu vaccine for 2023-24 flu/ winter season          If you are experiencing a [place] or [place] or need someone to talk to, please  call the Suicide and Crisis Lifeline: 988 call the [place] National Suicide Prevention Lifeline: 716-060-8677 or TTY: 508 677 8784 TTY 872-877-8755) to talk to a trained counselor call 1-800-273-TALK (toll free, 24 hour hotline) go to [facility] [address], [place] [phone]) call the [place]: [phone] call 911   Patient verbalizes understanding of instructions and care plan provided today and agrees to view in [place]. Active MyChart status and patient understanding of how to access  instructions and care plan via MyChart confirmed with patient.     No further follow up required: no further or ongoing care coordination needs identified today  Oneta Rack, RN, BSN, CCRN Alumnus RN CM Care Coordination/ [facility] [place] [facility] [phone]: direct office

## 2022-09-30 ENCOUNTER — Telehealth: Payer: Self-pay | Admitting: Podiatry

## 2022-09-30 NOTE — Telephone Encounter (Signed)
LVM for pt to call back for an appt for DM SHOE PICK UP  

## 2022-10-01 NOTE — Progress Notes (Signed)
Remote pacemaker transmission.   

## 2022-10-07 ENCOUNTER — Ambulatory Visit (INDEPENDENT_AMBULATORY_CARE_PROVIDER_SITE_OTHER): Payer: Medicare Other

## 2022-10-07 DIAGNOSIS — L84 Corns and callosities: Secondary | ICD-10-CM

## 2022-10-07 DIAGNOSIS — E1140 Type 2 diabetes mellitus with diabetic neuropathy, unspecified: Secondary | ICD-10-CM | POA: Diagnosis not present

## 2022-10-07 NOTE — Progress Notes (Signed)
Patient presents today to pick up diabetic shoes and insoles.  Patient was dispensed 1 pair of diabetic shoes and 3 pairs of foam casted diabetic insoles. Fit was satisfactory. Instructions for break-in and wear was reviewed and a copy was given to the patient.   Re-appointment for regularly scheduled diabetic foot care visits or if they should experience any trouble with the shoes or insoles.  

## 2022-10-13 ENCOUNTER — Ambulatory Visit: Payer: Medicare Other | Admitting: *Deleted

## 2022-10-13 DIAGNOSIS — E1140 Type 2 diabetes mellitus with diabetic neuropathy, unspecified: Secondary | ICD-10-CM

## 2022-10-13 NOTE — Progress Notes (Signed)
Patient presents to the office today with DM shoe concerns.  Patient states the shoes felt too tight.    We will send back: Apex V751 11.5 wide men's   Reorder: Apex X521M 12 wide(per pt request)  Insoles were kept by the patient.  Patient will be contacted for a fitting appointment once the reordered shoes arrive in office.

## 2022-10-14 ENCOUNTER — Ambulatory Visit (INDEPENDENT_AMBULATORY_CARE_PROVIDER_SITE_OTHER): Payer: Medicare Other

## 2022-10-14 ENCOUNTER — Ambulatory Visit (INDEPENDENT_AMBULATORY_CARE_PROVIDER_SITE_OTHER): Payer: Medicare Other | Admitting: Internal Medicine

## 2022-10-14 ENCOUNTER — Encounter: Payer: Self-pay | Admitting: Internal Medicine

## 2022-10-14 VITALS — BP 120/70 | HR 48 | Temp 97.9°F | Ht 74.0 in | Wt 213.0 lb

## 2022-10-14 DIAGNOSIS — N182 Chronic kidney disease, stage 2 (mild): Secondary | ICD-10-CM

## 2022-10-14 DIAGNOSIS — R051 Acute cough: Secondary | ICD-10-CM

## 2022-10-14 DIAGNOSIS — I10 Essential (primary) hypertension: Secondary | ICD-10-CM

## 2022-10-14 DIAGNOSIS — E1122 Type 2 diabetes mellitus with diabetic chronic kidney disease: Secondary | ICD-10-CM | POA: Diagnosis not present

## 2022-10-14 DIAGNOSIS — E7800 Pure hypercholesterolemia, unspecified: Secondary | ICD-10-CM

## 2022-10-14 DIAGNOSIS — E559 Vitamin D deficiency, unspecified: Secondary | ICD-10-CM

## 2022-10-14 DIAGNOSIS — I6522 Occlusion and stenosis of left carotid artery: Secondary | ICD-10-CM | POA: Diagnosis not present

## 2022-10-14 DIAGNOSIS — R059 Cough, unspecified: Secondary | ICD-10-CM | POA: Diagnosis not present

## 2022-10-14 DIAGNOSIS — J309 Allergic rhinitis, unspecified: Secondary | ICD-10-CM | POA: Diagnosis not present

## 2022-10-14 LAB — BASIC METABOLIC PANEL
BUN: 25 mg/dL — ABNORMAL HIGH (ref 6–23)
CO2: 33 mEq/L — ABNORMAL HIGH (ref 19–32)
Calcium: 9.1 mg/dL (ref 8.4–10.5)
Chloride: 102 mEq/L (ref 96–112)
Creatinine, Ser: 1.59 mg/dL — ABNORMAL HIGH (ref 0.40–1.50)
GFR: 39.96 mL/min — ABNORMAL LOW (ref >60.00)
Glucose, Bld: 105 mg/dL — ABNORMAL HIGH (ref 70–99)
Potassium: 4.8 mEq/L (ref 3.5–5.1)
Sodium: 138 mEq/L (ref 135–145)

## 2022-10-14 LAB — HEPATIC FUNCTION PANEL
ALT: 19 U/L (ref 0–53)
AST: 21 U/L (ref 0–37)
Albumin: 4.2 g/dL (ref 3.5–5.2)
Alkaline Phosphatase: 91 U/L (ref 39–117)
Bilirubin, Direct: 0.2 mg/dL (ref 0.0–0.3)
Total Bilirubin: 0.9 mg/dL (ref 0.2–1.2)
Total Protein: 6.9 g/dL (ref 6.0–8.3)

## 2022-10-14 LAB — LIPID PANEL
Cholesterol: 121 mg/dL (ref 0–200)
HDL: 39.9 mg/dL (ref >39.00)
LDL Cholesterol: 61 mg/dL (ref 0–99)
NonHDL: 81.53
Total CHOL/HDL Ratio: 3
Triglycerides: 101 mg/dL (ref 0.0–149.0)
VLDL: 20.2 mg/dL (ref 0.0–40.0)

## 2022-10-14 LAB — VITAMIN D 25 HYDROXY (VIT D DEFICIENCY, FRACTURES): VITD: 67.94 ng/mL (ref 30.00–100.00)

## 2022-10-14 LAB — HEMOGLOBIN A1C: Hgb A1c MFr Bld: 7.3 % — ABNORMAL HIGH (ref 4.6–6.5)

## 2022-10-14 MED ORDER — ACCU-CHEK SOFTCLIX LANCETS MISC: 3 refills | Status: DC

## 2022-10-14 NOTE — Assessment & Plan Note (Signed)
Lab Results  Component Value Date   HGBA1C 7.3 (H) 10/14/2022   Unocntrolled, but adequate for age, pt to continue current medical treatment  - diet, wt contrl, excercise

## 2022-10-14 NOTE — Progress Notes (Signed)
Patient ID: Thomas Marshall, male   DOB: 05-07-1941, 81 y.o.   MRN: [id number]        Chief Complaint: follow up HTN, HLD and DM, low vit d, allergies with cough       HPI:  Thomas Marshall is a 81 y.o. male also Does have several wks ongoing nasal allergy symptoms with clearish congestion, itch and sneezing, without fever, pain, ST, swelling or wheezing but has recurring scant prod cough.  Pt denies chest pain, increased sob or doe, wheezing, orthopnea, PND, increased LE swelling, palpitations, dizziness or syncope.   Pt denies polydipsia, polyuria, or new focal neuro s/s.    Pt denies fever, wt loss, night sweats, loss of appetite, or other constitutional symptoms  taking Vit D       Wt Readings from Last 3 Encounters:  10/14/22 213 lb (96.6 kg)  08/12/22 205 lb (93 kg)  06/15/22 212 lb (96.2 kg)   BP Readings from Last 3 Encounters:  10/14/22 120/70  06/15/22 120/68  04/15/22 124/80         Past Medical History:  Diagnosis Date   AAA (abdominal aortic aneurysm) ([place])    Arthritis    Cancer ([place])    skin & squamous cell   Carotid artery disease ([place])    a. s/p R CEA 12/2015.   CKD (chronic kidney disease), stage III ([place])    a. per historical labs.   Coronary artery disease    a. s/p CABG 2004.   Diabetes mellitus without complication (HCC)    GERD (gastroesophageal reflux disease)    Hyperlipidemia    Hypertension    Medication intolerance Multiple    PAF (paroxysmal atrial fibrillation) (HCC)    Pneumonia    Presence of permanent cardiac pacemaker    Sinus bradycardia    a. baseline HR 50s-60s, also h/o bradycardia on metoprolol and carvedilol   Past Surgical History:  Procedure Laterality Date   ANKLE SURGERY     right fused   BACK SURGERY     low back    CARDIOVERSION N/A 02/14/2020   Procedure: CARDIOVERSION;  Surgeon: Geralynn Rile, MD;  Location: [facility];  Service: Cardiovascular;  Laterality: N/A;   CARDIOVERSION N/A 02/26/2020   Procedure: CARDIOVERSION;   Surgeon: [name], MD;  Location: [facility] ENDOSCOPY;  Service: Cardiovascular;  Laterality: N/A;   CARDIOVERSION N/A 04/12/2020   Procedure: CARDIOVERSION;  Surgeon: Skeet Latch, MD;  Location: [facility] ENDOSCOPY;  Service: Cardiovascular;  Laterality: N/A;   CHOLECYSTECTOMY N/A 12/07/2013   Procedure: LAPAROSCOPIC CHOLECYSTECTOMY ;  Surgeon: [name], MD;  Location: [facility] OR;  Service: General;  Laterality: N/A;   COLONOSCOPY     CORONARY ARTERY BYPASS GRAFT  2004   [place] N/A 05/21/2017   Procedure: Coronary Stent Intervention;  Surgeon: [name], MD;  Location: [place] CV LAB;  Service: Cardiovascular;  Laterality: N/A;   ENDARTERECTOMY Right 12/10/2015   Procedure: Right Carotid ENDARTERECTOMY;  Surgeon: Conrad Maynard, MD;  Location: [facility];  Service: Vascular;  Laterality: Right;   ENDARTERECTOMY Left 12/30/2021   Procedure: LEFT CAROTID ENDARTERECTOMY;  Surgeon: Angelia Mould, MD;  Location: [facility] OR;  Service: Vascular;  Laterality: Left;   EXPLORATION POST OPERATIVE OPEN HEART     FINGER SURGERY     skin graft on rt index   FINGER SURGERY Right    right index finger.   HERNIA REPAIR Right    RIH  JOINT REPLACEMENT     KNEE SURGERY     replacement on both knees   LEFT HEART CATH AND CORS/GRAFTS ANGIOGRAPHY N/A 05/21/2017   Procedure: Left Heart Cath and Cors/Grafts Angiography;  Surgeon: [name], MD;  Location: [place] [facility] LAB;  Service: Cardiovascular;  Laterality: N/A;   LEFT HEART CATH AND CORS/GRAFTS ANGIOGRAPHY N/A 05/20/2020   Procedure: LEFT HEART CATH AND CORS/GRAFTS ANGIOGRAPHY;  Surgeon: [name], MD;  Location: [place] CV LAB;  Service: Cardiovascular;  Laterality: N/A;   PACEMAKER IMPLANT N/A 03/14/2020   Procedure: PACEMAKER IMPLANT;  Surgeon: Evans Lance, MD;  Location: [place] CV LAB;  Service: Cardiovascular;  Laterality: N/A;   PROSTATE SURGERY     PVC ABLATION N/A 10/27/2017    Procedure: PVC ABLATION;  Surgeon: Evans Lance, MD;  Location: [place] CV LAB;  Service: Cardiovascular;  Laterality: N/A;   TONSILLECTOMY     URINARY SURGERY     scar tissue    reports that he quit smoking about 51 years ago. His smoking use included cigarettes. He has a 13.00 pack-year smoking history. He has never used smokeless tobacco. He reports that he does not drink alcohol and does not use drugs. family history includes Cancer in his sister; Diabetes in his brother and sister; Heart disease in his brother, father, and mother; Hyperlipidemia in his brother and father; Hypertension in his brother, father, and sister; Stroke in his mother. Allergies  Allergen Reactions   Lisinopril Swelling and Other (See Comments)    Angioedema.    Losartan Swelling and Other (See Comments)    Lips swell Angioedema    Nifedipine Swelling and Other (See Comments)    Sugar increase   Triamterene Swelling and Other (See Comments)    Face swells, no breathing impairment   Hydralazine Hcl Other (See Comments)    Fatigue; poor appetite   Losartan Potassium-Hctz Swelling and Other (See Comments)   Amoxicillin Rash and Other (See Comments)    Did it involve swelling of the face/tongue/throat, SOB, or low BP? No Did it involve sudden or severe rash/hives, skin peeling, or any reaction on the inside of your mouth or nose? No Did you need to seek medical attention at a hospital or doctor's office? No When did it last happen?      10 + years If all above answers are "NO", may proceed with cephalosporin use.     Ampicillin Rash and Other (See Comments)    Did it involve swelling of the face/tongue/throat, SOB, or low BP? No Did it involve sudden or severe rash/hives, skin peeling, or any reaction on the inside of your mouth or nose? No Did you need to seek medical attention at a hospital or doctor's office? No When did it last happen?      10 + years If all above answers are "NO", may proceed  with cephalosporin use.    Carvedilol Other (See Comments)    Low heart rate (in higher doses)   Current Outpatient Medications on File Prior to Visit  Medication Sig Dispense Refill   ACCU-CHEK GUIDE test strip USE 1 NEW TEST STRIP ONCE DAILY TO TEST BLOOD SUGAR 50 each 0   ACCU-CHEK GUIDE test strip USE 1 STRIP ONCE DAILY 50 each 0   apixaban (ELIQUIS) 2.5 MG TABS tablet Take 1 tablet (2.5 mg total) by mouth 2 (two) times daily. (0800 & 2000) 180 tablet 3   aspirin EC 81 MG tablet Take 1 tablet (  81 mg total) by mouth daily. 90 tablet 3   atorvastatin (LIPITOR) 40 MG tablet Take 40 mg by mouth at bedtime.      carvedilol (COREG) 6.25 MG tablet Take 1 tablet (6.25 mg total) by mouth 2 (two) times daily. 180 tablet 3   cholecalciferol (VITAMIN D3) 25 MCG (1000 UNIT) tablet Take 1,000 Units by mouth every evening.     hydrALAZINE (APRESOLINE) 10 MG tablet Take 10 mg by mouth 3 (three) times daily as needed (elevated bp greater than 160).     magnesium oxide (MAG-OX) 400 MG tablet Take 800 mg by mouth 2 (two) times daily.      meclizine (ANTIVERT) 25 MG tablet Take 1 tablet (25 mg total) by mouth 3 (three) times daily as needed for dizziness. 30 tablet 0   Multiple Vitamins-Minerals (MENS 50+ MULTI VITAMIN/MIN PO) Take by mouth daily at 6 (six) AM.     nitroGLYCERIN (NITROSTAT) 0.4 MG SL tablet Place 1 tablet (0.4 mg total) under the tongue every 5 (five) minutes as needed for chest pain (x 3 doses). 30 tablet 3   OMEPRAZOLE PO Take 1 capsule by mouth daily.     pantoprazole (PROTONIX) 40 MG tablet Take 1 tablet (40 mg total) by mouth daily. 90 tablet 3   terazosin (HYTRIN) 1 MG capsule Take 1 mg by mouth at bedtime.  30 capsule 11   vitamin C (ASCORBIC ACID) 500 MG tablet Take 500 mg by mouth daily with breakfast.      Zinc 22.5 MG TABS Take 22.5 mg by mouth 2 (two) times a week.     No current facility-administered medications on file prior to visit.        ROS:  All others reviewed and  negative.  Objective        PE:  BP 120/70 (BP Location: Left Arm, Patient Position: Sitting, Cuff Size: Large)   Pulse (!) 48   Temp 97.9 F (36.6 C) (Oral)   Ht '6\' 2"'$  (1.88 m)   Wt 213 lb (96.6 kg)   SpO2 96%   BMI 27.35 kg/m                 Constitutional: Pt appears in NAD               HENT: Head: NCAT.                Right Ear: External ear normal.                 Left Ear: External ear normal.   Bilat tm's with mild erythema.  Max sinus areas non tender.  Pharynx with mild erythema, no exudate                 Eyes: . Pupils are equal, round, and reactive to light. Conjunctivae and EOM are normal               Nose: without d/c or deformity               Neck: Neck supple. Gross normal ROM               Cardiovascular: Normal rate and regular rhythm.                 Pulmonary/Chest: Effort normal and breath sounds without rales or wheezing.                Abd:  Soft, NT, ND, + BS, no organomegaly  Neurological: Pt is alert. At baseline orientation, motor grossly intact               Skin: Skin is warm. No rashes, no other new lesions, LE edema - none               Psychiatric: Pt behavior is normal without agitation   Micro: none  Cardiac tracings I have personally interpreted today:  none  Pertinent Radiological findings (summarize): none   Lab Results  Component Value Date   WBC 7.1 12/31/2021   HGB 11.1 (L) 12/31/2021   HCT 33.0 (L) 12/31/2021   PLT 84 (L) 12/31/2021   GLUCOSE 105 (H) 10/14/2022   CHOL 121 10/14/2022   TRIG 101.0 10/14/2022   HDL 39.90 10/14/2022   LDLDIRECT 67.0 09/16/2016   LDLCALC 61 10/14/2022   ALT 19 10/14/2022   AST 21 10/14/2022   NA 138 10/14/2022   K 4.8 10/14/2022   CL 102 10/14/2022   CREATININE 1.59 (H) 10/14/2022   BUN 25 (H) 10/14/2022   CO2 33 (H) 10/14/2022   TSH 1.59 10/15/2021   PSA 1.09 03/31/2018   INR 1.2 12/26/2021   HGBA1C 7.3 (H) 10/14/2022   MICROALBUR 6.2 (H) 10/15/2021   Assessment/Plan:   HANSEL DEVAN is a 81 y.o. White or Caucasian [1] male with  has a past medical history of AAA (abdominal aortic aneurysm) ([place]), Arthritis, Cancer ([place]), Carotid artery disease ([place]), CKD (chronic kidney disease), stage III ([place]), Coronary artery disease, Diabetes mellitus without complication ([place]), GERD (gastroesophageal reflux disease), Hyperlipidemia, Hypertension, Medication intolerance ([place] ), PAF (paroxysmal atrial fibrillation) ([place]), Pneumonia, Presence of permanent cardiac pacemaker, and Sinus bradycardia.  Vitamin D deficiency Last vitamin D Lab Results  Component Value Date   VD25OH 67.94 10/14/2022   Stable, cont oral replacement   Type 2 diabetes mellitus with renal manifestations ([place]) Lab Results  Component Value Date   HGBA1C 7.3 (H) 10/14/2022   Unocntrolled, but adequate for age, pt to continue current medical treatment  - diet, wt contrl, excercise   Hyperlipidemia Lab Results  Component Value Date   LDLCALC 61 10/14/2022   Stable, pt to continue current statin lipitor 40 mg qd   Essential hypertension BP Readings from Last 3 Encounters:  10/14/22 120/70  06/15/22 120/68  04/15/22 124/80   Stable, pt to continue medical treatment coreg 6.25 mg bid, hydralazine 10 mg prn sbp > 160   Allergic rhinitis Mild to mod, for adding Nasacort qd prn asd,  to f/u any worsening symptoms or concerns  Cough Most likely due to allergies and post naal gtt, but also for cxr  Followup: Return in about 6 months (around 04/14/2023).  Cathlean Cower, MD 10/14/2022 7:43 PM [facility]

## 2022-10-14 NOTE — Assessment & Plan Note (Signed)
BP Readings from Last 3 Encounters:  10/14/22 120/70  06/15/22 120/68  04/15/22 124/80   Stable, pt to continue medical treatment coreg 6.25 mg bid, hydralazine 10 mg prn sbp > 160

## 2022-10-14 NOTE — Assessment & Plan Note (Addendum)
Mild to mod, for adding Nasacort qd prn asd,  to f/u any worsening symptoms or concerns

## 2022-10-14 NOTE — Assessment & Plan Note (Signed)
Most likely due to allergies and post naal gtt, but also for cxr

## 2022-10-14 NOTE — Patient Instructions (Addendum)
Please have your Shingrix (shingles) shots done at your local pharmacy.  Ok to try the OTC Nasacort for allergies  Please continue all other medications as before, and refills have been done if requested.  Please have the pharmacy call with any other refills you may need.  Please continue your efforts at being more active, low cholesterol diet, and weight control.  You are otherwise up to date with prevention measures today.  Please keep your appointments with your specialists as you may have planned  Please go to the XRAY Department in the first floor for the x-ray testing  Please go to the LAB at the blood drawing area for the tests to be done  You will be contacted by phone if any changes need to be made immediately.  Otherwise, you will receive a letter about your results with an explanation, but please check with MyChart first.  Please make an Appointment to return in 6 months, or sooner if needed

## 2022-10-14 NOTE — Assessment & Plan Note (Signed)
Lab Results  Component Value Date   LDLCALC 61 10/14/2022   Stable, pt to continue current statin lipitor 40 mg qd

## 2022-10-14 NOTE — Assessment & Plan Note (Signed)
Last vitamin D Lab Results  Component Value Date   VD25OH 67.94 10/14/2022   Stable, cont oral replacement

## 2022-10-16 ENCOUNTER — Ambulatory Visit: Payer: Medicare Other | Admitting: Internal Medicine

## 2022-10-19 ENCOUNTER — Telehealth: Payer: Self-pay | Admitting: Internal Medicine

## 2022-10-19 DIAGNOSIS — E1122 Type 2 diabetes mellitus with diabetic chronic kidney disease: Secondary | ICD-10-CM

## 2022-10-19 NOTE — Telephone Encounter (Signed)
Patient called about Accu-Chek Softclix Lancets lancets, he said that it says for 2 a day but his insurance will only cover it once a day and he asked if it could be fixed and resent in for 1 a day along with his test strips.  [facility] on [address] in [place]

## 2022-10-20 MED ORDER — ACCU-CHEK GUIDE VI STRP: ORAL_STRIP | 3 refills | Status: DC

## 2022-10-20 MED ORDER — ACCU-CHEK SOFTCLIX LANCETS MISC: 3 refills | Status: DC

## 2022-10-20 NOTE — Telephone Encounter (Signed)
Ok to [employer] - done

## 2022-10-20 NOTE — Telephone Encounter (Signed)
Please resend lancets for pharmacy for once a day instead of 2 times a day along with test strips. Patient states insurance will not cover current script.

## 2022-10-26 ENCOUNTER — Ambulatory Visit: Payer: Medicare Other | Admitting: *Deleted

## 2022-10-26 DIAGNOSIS — E1140 Type 2 diabetes mellitus with diabetic neuropathy, unspecified: Secondary | ICD-10-CM | POA: Diagnosis not present

## 2022-10-26 DIAGNOSIS — L84 Corns and callosities: Secondary | ICD-10-CM

## 2022-10-26 NOTE — Progress Notes (Signed)
Patient presents today to pick up diabetic shoes and insoles.  Patient was dispensed 1 pair of diabetic shoes. He tried on the shoes with the insoles and the fit was satisfactory.    Will follow up with Dr Jacqualyn Posey next year for new order.

## 2022-11-02 ENCOUNTER — Telehealth: Payer: Self-pay

## 2022-11-02 DIAGNOSIS — E1122 Type 2 diabetes mellitus with diabetic chronic kidney disease: Secondary | ICD-10-CM

## 2022-11-02 MED ORDER — ACCU-CHEK GUIDE VI STRP: ORAL_STRIP | 3 refills | Status: DC

## 2022-11-02 MED ORDER — ACCU-CHEK SOFTCLIX LANCETS MISC: 3 refills | Status: DC

## 2022-11-02 NOTE — Telephone Encounter (Signed)
Patient called back - this has still not been done.  Please send to pharmacy per Dr. Gwynn Burly ok

## 2022-11-02 NOTE — Telephone Encounter (Signed)
Script still says to use twice daily as directed, please advise

## 2022-11-02 NOTE — Telephone Encounter (Signed)
Refill done lancets and strip once daily

## 2022-11-13 ENCOUNTER — Ambulatory Visit (INDEPENDENT_AMBULATORY_CARE_PROVIDER_SITE_OTHER): Payer: Medicare Other | Admitting: Podiatry

## 2022-11-13 DIAGNOSIS — E1140 Type 2 diabetes mellitus with diabetic neuropathy, unspecified: Secondary | ICD-10-CM

## 2022-11-13 DIAGNOSIS — B351 Tinea unguium: Secondary | ICD-10-CM

## 2022-11-13 DIAGNOSIS — L84 Corns and callosities: Secondary | ICD-10-CM

## 2022-11-13 DIAGNOSIS — M79675 Pain in left toe(s): Secondary | ICD-10-CM

## 2022-11-13 DIAGNOSIS — Z7901 Long term (current) use of anticoagulants: Secondary | ICD-10-CM | POA: Diagnosis not present

## 2022-11-13 DIAGNOSIS — M79674 Pain in right toe(s): Secondary | ICD-10-CM

## 2022-11-15 NOTE — Progress Notes (Signed)
Patient ID: Thomas Marshall, male   DOB: 06-Mar-1941, 81 y.o.   MRN: [id number]  Subjective:  Chief Complaint  Patient presents with   Diabetes    Diabetic foot care, nail trim and callus      81 y.o. returns the office today for painful, elongated, thickened toenails which he cannot trim himself and for calluses on the right foot.  Calluses of not becoming too tender or thick.  No open sores or reports.  No swelling redness or drainage.    He is on Eliquis  Last A1c was 7.3 on October 14, 2022  Thomas Borg, MD Last seen October 14, 2022   Objective: AAO 3, NAD DP/PT pulses palpable, CRT less than 3 seconds Protective sensation decreased with Thomas Marshall monofilament Nails hypertrophic, dystrophic, elongated, brittle, discolored 10. There is tenderness overlying the nails 1-5 bilaterally. There is no surrounding erythema or drainage along the nail sites. There is hyperkeratotic lesion right fifth metatarsal base and fifth metatarsal head. No ulceration, drainage or any signs of infection.  No pain with calf compression, swelling, warmth, erythema.  Assessment: Symptomatic onychomycosis; hyperkeratotic tissue  Plan: -Treatment options including alternatives, risks, complications were discussed -Nails sharply debrided 10 without complication/bleeding. -Sharply debrided the hyperkeratotic lesions x 2 right foot any complications or bleeding. -Discussed daily foot inspection. -Follow-up in 3 months or sooner if any problems are to arise. In the meantime, encouraged to call the office with any questions, concerns, changes symptoms.  Thomas Marshall, DPM

## 2022-11-17 DIAGNOSIS — L570 Actinic keratosis: Secondary | ICD-10-CM | POA: Diagnosis not present

## 2022-11-17 DIAGNOSIS — C4442 Squamous cell carcinoma of skin of scalp and neck: Secondary | ICD-10-CM | POA: Diagnosis not present

## 2022-11-25 ENCOUNTER — Ambulatory Visit (INDEPENDENT_AMBULATORY_CARE_PROVIDER_SITE_OTHER): Payer: Medicare Other

## 2022-11-25 DIAGNOSIS — L84 Corns and callosities: Secondary | ICD-10-CM

## 2022-11-25 DIAGNOSIS — E1140 Type 2 diabetes mellitus with diabetic neuropathy, unspecified: Secondary | ICD-10-CM

## 2022-11-26 ENCOUNTER — Encounter: Payer: Self-pay | Admitting: Internal Medicine

## 2022-11-26 ENCOUNTER — Ambulatory Visit (INDEPENDENT_AMBULATORY_CARE_PROVIDER_SITE_OTHER): Payer: Medicare Other | Admitting: Internal Medicine

## 2022-11-26 VITALS — BP 120/80 | HR 81 | Temp 97.9°F | Wt 212.0 lb

## 2022-11-26 DIAGNOSIS — I10 Essential (primary) hypertension: Secondary | ICD-10-CM | POA: Diagnosis not present

## 2022-11-26 DIAGNOSIS — R051 Acute cough: Secondary | ICD-10-CM | POA: Diagnosis not present

## 2022-11-26 DIAGNOSIS — N1832 Chronic kidney disease, stage 3b: Secondary | ICD-10-CM

## 2022-11-26 DIAGNOSIS — U071 COVID-19: Secondary | ICD-10-CM | POA: Insufficient documentation

## 2022-11-26 DIAGNOSIS — I6522 Occlusion and stenosis of left carotid artery: Secondary | ICD-10-CM

## 2022-11-26 DIAGNOSIS — E1122 Type 2 diabetes mellitus with diabetic chronic kidney disease: Secondary | ICD-10-CM | POA: Diagnosis not present

## 2022-11-26 MED ORDER — NIRMATRELVIR/RITONAVIR (PAXLOVID) TABLET (RENAL DOSING): 2.0000 | ORAL_TABLET | Freq: Two times a day (BID) | ORAL | 0 refills | Status: AC

## 2022-11-26 MED ORDER — LEVOFLOXACIN 500 MG PO TABS: 500.0000 mg | ORAL_TABLET | Freq: Every day | ORAL | 0 refills | Status: DC

## 2022-11-26 MED ORDER — HYDROCODONE BIT-HOMATROP MBR 5-1.5 MG/5ML PO SOLN: 5.0000 mL | Freq: Four times a day (QID) | ORAL | 0 refills | Status: DC | PRN

## 2022-11-26 NOTE — Assessment & Plan Note (Signed)
Mild to mod, for paxlovid course,  cough med prn, to f/u any worsening symptoms or concerns 

## 2022-11-26 NOTE — Assessment & Plan Note (Signed)
Lab Results  Component Value Date   HGBA1C 7.3 (H) 10/14/2022   Uncontrolled but ok for age, pt to continue current medical treatment  - diet, wt control

## 2022-11-26 NOTE — Patient Instructions (Addendum)
Please take all new medication as prescribed - the antibiotic - paxlovid, and cough medicine as needed  Please continue all other medications as before, and refills have been done if requested.  Please have the pharmacy call with any other refills you may need.  Please continue your efforts at being more active, low cholesterol diet, and weight control.  Please keep your appointments with your specialists as you may have planned  Please remember to sign up for MyChart if you have not done so, as this will be important to you in the future with finding out test results, communicating by private email, and scheduling acute appointments online when needed.

## 2022-11-26 NOTE — Assessment & Plan Note (Signed)
BP Readings from Last 3 Encounters:  11/26/22 120/80  10/14/22 120/70  06/15/22 120/68   Stable, pt to continue medical treatment coreg 6.25 bid, hydralazine 10 tid

## 2022-11-26 NOTE — Progress Notes (Signed)
Patient ID: Thomas Marshall, male   DOB: 1941-09-21, 81 y.o.   MRN: [id number]        Chief Complaint: follow up new covid infection, htn, dm       HPI:  Thomas Marshall is a 81 y.o. male Here with acute onset mild to mod 2-3 days ST, HA, general weakness and malaise, with prod cough whitish sputum, but Pt denies chest pain, increased sob or doe, wheezing, orthopnea, PND, increased LE swelling, palpitations, dizziness or syncope.  No N/V/D.   Pt denies polydipsia, polyuria, or new focal neuro s/s.    Pt denies recent wt loss, night sweats, loss of appetite, or other constitutional symptom       Wt Readings from Last 3 Encounters:  11/26/22 212 lb (96.2 kg)  10/14/22 213 lb (96.6 kg)  08/12/22 205 lb (93 kg)   BP Readings from Last 3 Encounters:  11/26/22 120/80  10/14/22 120/70  06/15/22 120/68         Past Medical History:  Diagnosis Date   AAA (abdominal aortic aneurysm) ([place])    Arthritis    Cancer ([place])    skin & squamous cell   Carotid artery disease ([place])    a. s/p R CEA 12/2015.   CKD (chronic kidney disease), stage III ([place])    a. per historical labs.   Coronary artery disease    a. s/p CABG 2004.   Diabetes mellitus without complication (HCC)    GERD (gastroesophageal reflux disease)    Hyperlipidemia    Hypertension    Medication intolerance Multiple    PAF (paroxysmal atrial fibrillation) (HCC)    Pneumonia    Presence of permanent cardiac pacemaker    Sinus bradycardia    a. baseline HR 50s-60s, also h/o bradycardia on metoprolol and carvedilol   Past Surgical History:  Procedure Laterality Date   ANKLE SURGERY     right fused   BACK SURGERY     low back    CARDIOVERSION N/A 02/14/2020   Procedure: CARDIOVERSION;  Surgeon: Geralynn Rile, MD;  Location: [place];  Service: Cardiovascular;  Laterality: N/A;   CARDIOVERSION N/A 02/26/2020   Procedure: CARDIOVERSION;  Surgeon: [name], MD;  Location: [facility] ENDOSCOPY;  Service: Cardiovascular;   Laterality: N/A;   CARDIOVERSION N/A 04/12/2020   Procedure: CARDIOVERSION;  Surgeon: Skeet Latch, MD;  Location: [facility] ENDOSCOPY;  Service: Cardiovascular;  Laterality: N/A;   CHOLECYSTECTOMY N/A 12/07/2013   Procedure: LAPAROSCOPIC CHOLECYSTECTOMY ;  Surgeon: [name], MD;  Location: [facility] OR;  Service: General;  Laterality: N/A;   COLONOSCOPY     CORONARY ARTERY BYPASS GRAFT  2004   [place] N/A 05/21/2017   Procedure: Coronary Stent Intervention;  Surgeon: [name], MD;  Location: [place] CV LAB;  Service: Cardiovascular;  Laterality: N/A;   ENDARTERECTOMY Right 12/10/2015   Procedure: Right Carotid ENDARTERECTOMY;  Surgeon: Conrad [place], MD;  Location: [place];  Service: Vascular;  Laterality: Right;   ENDARTERECTOMY Left 12/30/2021   Procedure: LEFT CAROTID ENDARTERECTOMY;  Surgeon: Angelia Mould, MD;  Location: [facility] OR;  Service: Vascular;  Laterality: Left;   EXPLORATION POST OPERATIVE OPEN HEART     FINGER SURGERY     skin graft on rt index   FINGER SURGERY Right    right index finger.   HERNIA REPAIR Right    RIH   JOINT REPLACEMENT     KNEE SURGERY     replacement  on both knees   LEFT HEART CATH AND CORS/GRAFTS ANGIOGRAPHY N/A 05/21/2017   Procedure: Left Heart Cath and Cors/Grafts Angiography;  Surgeon: [name], MD;  Location: [place] CV LAB;  Service: Cardiovascular;  Laterality: N/A;   LEFT HEART CATH AND CORS/GRAFTS ANGIOGRAPHY N/A 05/20/2020   Procedure: LEFT HEART CATH AND CORS/GRAFTS ANGIOGRAPHY;  Surgeon: [name], MD;  Location: [place] CV LAB;  Service: Cardiovascular;  Laterality: N/A;   PACEMAKER IMPLANT N/A 03/14/2020   Procedure: PACEMAKER IMPLANT;  Surgeon: Evans Lance, MD;  Location: [place] CV LAB;  Service: Cardiovascular;  Laterality: N/A;   PROSTATE SURGERY     PVC ABLATION N/A 10/27/2017   Procedure: PVC ABLATION;  Surgeon: Evans Lance, MD;  Location: [place] CV LAB;   Service: Cardiovascular;  Laterality: N/A;   TONSILLECTOMY     URINARY SURGERY     scar tissue    reports that he quit smoking about 52 years ago. His smoking use included cigarettes. He has a 13.00 pack-year smoking history. He has never used smokeless tobacco. He reports that he does not drink alcohol and does not use drugs. family history includes Cancer in his sister; Diabetes in his brother and sister; Heart disease in his brother, father, and mother; Hyperlipidemia in his brother and father; Hypertension in his brother, father, and sister; Stroke in his mother. Allergies  Allergen Reactions   Lisinopril Swelling and Other (See Comments)    Angioedema.    Losartan Swelling and Other (See Comments)    Lips swell Angioedema    Nifedipine Swelling and Other (See Comments)    Sugar increase   Triamterene Swelling and Other (See Comments)    Face swells, no breathing impairment   Hydralazine Hcl Other (See Comments)    Fatigue; poor appetite   Losartan Potassium-Hctz Swelling and Other (See Comments)   Amoxicillin Rash and Other (See Comments)    Did it involve swelling of the face/tongue/throat, SOB, or low BP? No Did it involve sudden or severe rash/hives, skin peeling, or any reaction on the inside of your mouth or nose? No Did you need to seek medical attention at a hospital or doctor's office? No When did it last happen?      10 + years If all above answers are "NO", may proceed with cephalosporin use.     Ampicillin Rash and Other (See Comments)    Did it involve swelling of the face/tongue/throat, SOB, or low BP? No Did it involve sudden or severe rash/hives, skin peeling, or any reaction on the inside of your mouth or nose? No Did you need to seek medical attention at a hospital or doctor's office? No When did it last happen?      10 + years If all above answers are "NO", may proceed with cephalosporin use.    Carvedilol Other (See Comments)    Low heart rate (in higher  doses)   Current Outpatient Medications on File Prior to Visit  Medication Sig Dispense Refill   ACCU-CHEK GUIDE test strip USE 1 STRIP once DAILY 100 each 3   Accu-Chek Softclix Lancets lancets USE 1 once DAILY USE AS DIRECTED E11.9 100 each 3   apixaban (ELIQUIS) 2.5 MG TABS tablet Take 1 tablet (2.5 mg total) by mouth 2 (two) times daily. (0800 & 2000) 180 tablet 3   aspirin EC 81 MG tablet Take 1 tablet (81 mg total) by mouth daily. 90 tablet 3   atorvastatin (LIPITOR) 40 MG tablet  Take 40 mg by mouth at bedtime.      carvedilol (COREG) 6.25 MG tablet Take 1 tablet (6.25 mg total) by mouth 2 (two) times daily. 180 tablet 3   cholecalciferol (VITAMIN D3) 25 MCG (1000 UNIT) tablet Take 1,000 Units by mouth every evening.     hydrALAZINE (APRESOLINE) 10 MG tablet Take 10 mg by mouth 3 (three) times daily as needed (elevated bp greater than 160).     magnesium oxide (MAG-OX) 400 MG tablet Take 800 mg by mouth 2 (two) times daily.      meclizine (ANTIVERT) 25 MG tablet Take 1 tablet (25 mg total) by mouth 3 (three) times daily as needed for dizziness. 30 tablet 0   Multiple Vitamins-Minerals (MENS 50+ MULTI VITAMIN/MIN PO) Take by mouth daily at 6 (six) AM.     nitroGLYCERIN (NITROSTAT) 0.4 MG SL tablet Place 1 tablet (0.4 mg total) under the tongue every 5 (five) minutes as needed for chest pain (x 3 doses). 30 tablet 3   OMEPRAZOLE PO Take 1 capsule by mouth daily.     pantoprazole (PROTONIX) 40 MG tablet Take 1 tablet (40 mg total) by mouth daily. 90 tablet 3   terazosin (HYTRIN) 1 MG capsule Take 1 mg by mouth at bedtime.  30 capsule 11   triamcinolone ointment (KENALOG) 0.5 % Apply topically 3 (three) times daily.     vitamin C (ASCORBIC ACID) 500 MG tablet Take 500 mg by mouth daily with breakfast.      Zinc 22.5 MG TABS Take 22.5 mg by mouth 2 (two) times a week.     No current facility-administered medications on file prior to visit.        ROS:  All others reviewed and  negative.  Objective        PE:  BP 120/80 (BP Location: Left Arm, Patient Position: Sitting, Cuff Size: Normal)   Pulse 81   Temp 97.9 F (36.6 C) (Oral)   Wt 212 lb (96.2 kg)   SpO2 98%   BMI 27.22 kg/m                 Constitutional: Pt appears in NAD               HENT: Head: NCAT.                Right Ear: External ear normal.                 Left Ear: External ear normal.  Bilat tm's with mild erythema.  Max sinus areas non tender.  Pharynx with mild erythema, no exudate               Eyes: . Pupils are equal, round, and reactive to light. Conjunctivae and EOM are normal               Nose: without d/c or deformity               Neck: Neck supple. Gross normal ROM               Cardiovascular: Normal rate and regular rhythm.                 Pulmonary/Chest: Effort normal and breath sounds without rales or wheezing.                              Neurological: Pt is alert. At baseline orientation,  motor grossly intact               Skin: Skin is warm. No rashes, no other new lesions, LE edema - none               Psychiatric: Pt behavior is normal without agitation   Micro: none  Cardiac tracings I have personally interpreted today:  none  Pertinent Radiological findings (summarize): none   Lab Results  Component Value Date   WBC 7.1 12/31/2021   HGB 11.1 (L) 12/31/2021   HCT 33.0 (L) 12/31/2021   PLT 84 (L) 12/31/2021   GLUCOSE 105 (H) 10/14/2022   CHOL 121 10/14/2022   TRIG 101.0 10/14/2022   HDL 39.90 10/14/2022   LDLDIRECT 67.0 09/16/2016   LDLCALC 61 10/14/2022   ALT 19 10/14/2022   AST 21 10/14/2022   NA 138 10/14/2022   K 4.8 10/14/2022   CL 102 10/14/2022   CREATININE 1.59 (H) 10/14/2022   BUN 25 (H) 10/14/2022   CO2 33 (H) 10/14/2022   TSH 1.59 10/15/2021   PSA 1.09 03/31/2018   INR 1.2 12/26/2021   HGBA1C 7.3 (H) 10/14/2022   MICROALBUR 6.2 (H) 10/15/2021   Assessment/Plan:  Thomas Marshall is a 82 y.o. White or Caucasian [1] male with  has a past  medical history of AAA (abdominal aortic aneurysm) ([place]), Arthritis, Cancer ([place]), Carotid artery disease ([place]), CKD (chronic kidney disease), stage III ([place]), Coronary artery disease, Diabetes mellitus without complication ([place]), GERD (gastroesophageal reflux disease), Hyperlipidemia, Hypertension, Medication intolerance ([place] ), PAF (paroxysmal atrial fibrillation) ([place]), Pneumonia, Presence of permanent cardiac pacemaker, and Sinus bradycardia.  COVID-19 virus infection Mild to mod, for paxlovid course,  cough med prn, to f/u any worsening symptoms or concerns   Essential hypertension BP Readings from Last 3 Encounters:  11/26/22 120/80  10/14/22 120/70  06/15/22 120/68   Stable, pt to continue medical treatment coreg 6.25 bid, hydralazine 10 tid   Type 2 diabetes mellitus with renal manifestations (HCC) Lab Results  Component Value Date   HGBA1C 7.3 (H) 10/14/2022   Uncontrolled but ok for age, pt to continue current medical treatment  - diet, wt control  Followup: Return in about 5 months (around 04/14/2023).  Cathlean Cower, MD 11/26/2022 4:55 PM [facility]

## 2022-11-27 ENCOUNTER — Telehealth: Payer: Self-pay | Admitting: Internal Medicine

## 2022-11-27 NOTE — Telephone Encounter (Signed)
This is the correct renal dosed paxlovid, there is no lower dosing, only higher dosing for persons with stronger kidneys

## 2022-11-27 NOTE — Telephone Encounter (Signed)
Patient called as he was seen yesterday and given the Paxlovid to take for COVID and he is taking the 150-'100mg'$ . He saw where it said if there are kidney issues to take a lower dosage. He would like to make sure he is taking the right dosage so it wont mess with his kidneys. He would like a callback to confirm at [phone].

## 2022-11-27 NOTE — Telephone Encounter (Signed)
Patient is concerned that he should be taking a lower dose of the paxlovid due to kidney issues, please advise

## 2022-12-01 MED ORDER — HYDROCODONE BIT-HOMATROP MBR 5-1.5 MG/5ML PO SOLN: 5.0000 mL | Freq: Four times a day (QID) | ORAL | 0 refills | Status: AC | PRN

## 2022-12-01 NOTE — Telephone Encounter (Signed)
Spoke with spouse who states the patient has started paxlovid but is till having cough and fatigue. Patient took another at home test and it was negative

## 2022-12-01 NOTE — Telephone Encounter (Signed)
Ok to finish the paxlovid, but also I will send cough med

## 2022-12-01 NOTE — Telephone Encounter (Signed)
See below

## 2022-12-02 NOTE — Telephone Encounter (Signed)
Informed patient to complete paxlovid and an Rx for cough meds was sent to pharmacy

## 2022-12-07 DIAGNOSIS — N1832 Chronic kidney disease, stage 3b: Secondary | ICD-10-CM | POA: Diagnosis not present

## 2022-12-07 DIAGNOSIS — I4811 Longstanding persistent atrial fibrillation: Secondary | ICD-10-CM | POA: Diagnosis not present

## 2022-12-07 DIAGNOSIS — E1122 Type 2 diabetes mellitus with diabetic chronic kidney disease: Secondary | ICD-10-CM | POA: Diagnosis not present

## 2022-12-07 DIAGNOSIS — L570 Actinic keratosis: Secondary | ICD-10-CM | POA: Diagnosis not present

## 2022-12-07 DIAGNOSIS — L720 Epidermal cyst: Secondary | ICD-10-CM | POA: Diagnosis not present

## 2022-12-07 DIAGNOSIS — R809 Proteinuria, unspecified: Secondary | ICD-10-CM | POA: Diagnosis not present

## 2022-12-07 DIAGNOSIS — N189 Chronic kidney disease, unspecified: Secondary | ICD-10-CM | POA: Diagnosis not present

## 2022-12-07 DIAGNOSIS — N2581 Secondary hyperparathyroidism of renal origin: Secondary | ICD-10-CM | POA: Diagnosis not present

## 2022-12-07 DIAGNOSIS — D631 Anemia in chronic kidney disease: Secondary | ICD-10-CM | POA: Diagnosis not present

## 2022-12-07 DIAGNOSIS — D696 Thrombocytopenia, unspecified: Secondary | ICD-10-CM | POA: Diagnosis not present

## 2022-12-07 DIAGNOSIS — I129 Hypertensive chronic kidney disease with stage 1 through stage 4 chronic kidney disease, or unspecified chronic kidney disease: Secondary | ICD-10-CM | POA: Diagnosis not present

## 2022-12-14 ENCOUNTER — Ambulatory Visit (INDEPENDENT_AMBULATORY_CARE_PROVIDER_SITE_OTHER): Payer: Medicare Other

## 2022-12-14 DIAGNOSIS — I495 Sick sinus syndrome: Secondary | ICD-10-CM

## 2022-12-14 NOTE — Progress Notes (Signed)
Patient presents today to pick up diabetic shoes.  Patient ststes the shoe fit is satisfactory.  Will follow up next year for new order.

## 2022-12-15 LAB — CUP PACEART REMOTE DEVICE CHECK
Battery Remaining Longevity: 115 mo
Battery Remaining Percentage: 82 %
Battery Voltage: 3.02 V
Brady Statistic RV Percent Paced: 1 %
Date Time Interrogation Session: 20240115020013
Implantable Lead Connection Status: 753985
Implantable Lead Connection Status: 753985
Implantable Lead Implant Date: 20210415
Implantable Lead Implant Date: 20210415
Implantable Lead Location: 753859
Implantable Lead Location: 753860
Implantable Pulse Generator Implant Date: 20210415
Lead Channel Impedance Value: 480 Ohm
Lead Channel Pacing Threshold Amplitude: 0.5 V
Lead Channel Pacing Threshold Pulse Width: 0.5 ms
Lead Channel Sensing Intrinsic Amplitude: 6.8 mV
Lead Channel Setting Pacing Amplitude: 2.5 V
Lead Channel Setting Pacing Pulse Width: 0.5 ms
Lead Channel Setting Sensing Sensitivity: 2 mV
Pulse Gen Model: 2272
Pulse Gen Serial Number: 3813712

## 2022-12-22 ENCOUNTER — Encounter: Payer: Self-pay | Admitting: Physician Assistant

## 2022-12-22 ENCOUNTER — Ambulatory Visit: Payer: Medicare Other | Attending: Nurse Practitioner | Admitting: Physician Assistant

## 2022-12-22 VITALS — BP 152/94 | HR 75 | Ht 74.0 in | Wt 215.0 lb

## 2022-12-22 DIAGNOSIS — Z951 Presence of aortocoronary bypass graft: Secondary | ICD-10-CM

## 2022-12-22 DIAGNOSIS — I4819 Other persistent atrial fibrillation: Secondary | ICD-10-CM

## 2022-12-22 DIAGNOSIS — I7121 Aneurysm of the ascending aorta, without rupture: Secondary | ICD-10-CM

## 2022-12-22 DIAGNOSIS — I10 Essential (primary) hypertension: Secondary | ICD-10-CM | POA: Diagnosis not present

## 2022-12-22 DIAGNOSIS — I350 Nonrheumatic aortic (valve) stenosis: Secondary | ICD-10-CM | POA: Diagnosis not present

## 2022-12-22 DIAGNOSIS — N184 Chronic kidney disease, stage 4 (severe): Secondary | ICD-10-CM | POA: Diagnosis not present

## 2022-12-22 DIAGNOSIS — E785 Hyperlipidemia, unspecified: Secondary | ICD-10-CM | POA: Diagnosis not present

## 2022-12-22 DIAGNOSIS — Z7901 Long term (current) use of anticoagulants: Secondary | ICD-10-CM

## 2022-12-22 NOTE — Progress Notes (Signed)
Office Visit    Patient Name: Thomas Marshall Date of Encounter: 12/22/2022  PCP:  [name], MD   [place]  Cardiologist:  [name], MD  Advanced Practice Provider:  No care team member to display Electrophysiologist:  Cristopher Peru, MD   HPI    Thomas Marshall is a 82 y.o. male with a past medical history of CAD status post CABG times 03/2003, symptomatic PVCs status post ablation 2018 with marked reduction in PVCs from 25% down to 7%, PPM, PAF on chronic anticoagulation, carotid artery disease status post right endarterectomy greater than 80% left carotid stenosis, DM, HTN, hyperlipidemia, CKD, and small AAA.  He presents today for follow-up appointment.  Cardiac catheterization revealed patent RIMA to PDA, LIMA to LAD, SVG to diagonal, SVG to OM, severe native CAD with total occlusion of the left main and total occlusion of the RCA, no targets for intervention.  Most recent echocardiogram done in [place] 04/23/2020 revealed mild to moderate concentric LVH, LVEF at 60%, septal motion consistent with postoperative state, diastolic function not well-evaluated due to arrhythmia, moderately dilated LA, mildly dilated RA, mild to moderate aortic sclerosis without significant stenosis, mild to moderate mitral regurgitation, mild pulmonary hypertension, trace TR.  February 2022 developed recurrent atrial fibrillation despite dofetilide.  Was placed on amiodarone and converted to normal sinus rhythm.  It was held and he reverted back to A-fib with RVR, very symptomatic with fatigue when he is in A-fib.  Seen 11/22 by Amie Portland, PA.  In the effort to better control his heart rate and atrial fibrillation his carvedilol was increased to 6.25 mg twice a day.  Seen 12/08/2021 and at that time he was feeling well.  Scheduled office visit for preoperative cardiac evaluation.  Heart rate ranged from high 50s to 80s since increasing carvedilol to 6.25 twice daily.   Reported tolerating dose without any adverse effects.  Blood pressure was well-controlled.  Echocardiogram on 12/10/2021 revealed LVEF 55 to 60%, no RWMA, unable to evaluate diastolic dysfunction, normal RV, mild MR, moderate calcification of AV, mild aortic stenosis and increase in size of the aortic ascending dilatation from 39 mm to 44 mm.  Ordered chest CT without contrast further evaluation of aortic dilatation which revealed thoracic aorta is normal in caliber without aneurysm.  Saw Dr. Charisse March 12/16/2021 and was cleared for vascular surgery at that time and recommended a 43-monthfollow-up.  Underwent CEA on 12/30/2021 by Dr. DScot Dock  Seen by Dr. TLovena Le2/21/2023 for EP follow-up.  No changes were made to his medication regimen.  Most recently the patient was seen 06/15/2022 and was doing well at that time.  Continued to have some fatigue which had been the case since his onset of atrial fibrillation.  Today, he tells me that he feels okay, no chest pains or shortness of breath.  He never really had chest pain than when he had his stress test.  He sees a vascular doctor and has had bilateral endarterectomies for carotid artery disease.  The vascular doctor also checks his aorta.  His blood pressure is high today and we rechecked it and it was 140/80.  He stays in atrial fibrillation all the time and has fatigue all the time.  We have asked to keep track of the blood pressure at home and discussed how imperative it is due to his aortic aneurysm.  His echocardiogram showed mild aortic stenosis.  We will order a repeat study and plan  to repeat echoes annually.  Reports no shortness of breath nor dyspnea on exertion. Reports no chest pain, pressure, or tightness. No edema, orthopnea, PND. Reports no palpitations.    Past Medical History    Past Medical History:  Diagnosis Date   AAA (abdominal aortic aneurysm) ([place])    Arthritis    Cancer ([place])    skin & squamous cell   Carotid artery disease ([place])     a. s/p R CEA 12/2015.   CKD (chronic kidney disease), stage III ([place])    a. per historical labs.   Coronary artery disease    a. s/p CABG 2004.   Diabetes mellitus without complication (H[facility])    GERD (gastroesophageal reflux disease)    Hyperlipidemia    Hypertension    Medication intolerance Multiple    PAF (paroxysmal atrial fibrillation) (H[facility])    Pneumonia    Presence of permanent cardiac pacemaker    Sinus bradycardia    a. baseline HR 50s-60s, also h/o bradycardia on metoprolol and carvedilol   Past Surgical History:  Procedure Laterality Date   ANKLE SURGERY     right fused   BACK SURGERY     low back    CARDIOVERSION N/A 02/14/2020   Procedure: CARDIOVERSION;  Surgeon: Geralynn Rile, MD;  Location: [facility];  Service: Cardiovascular;  Laterality: N/A;   CARDIOVERSION N/A 02/26/2020   Procedure: CARDIOVERSION;  Surgeon: [name], MD;  [facility] ENDOSCOPY;  Service: Cardiovascular;  Laterality: N/A;   CARDIOVERSION N/A 04/12/2020   Procedure: CARDIOVERSION;  Surgeon: Skeet Latch, MD;  Location: [facility] ENDOSCOPY;  Service: Cardiovascular;  Laterality: N/A;   CHOLECYSTECTOMY N/A 12/07/2013   Procedure: LAPAROSCOPIC CHOLECYSTECTOMY ;  Surgeon: [name], MD;  Location: [facility] OR;  Service: General;  Laterality: N/A;   COLONOSCOPY     CORONARY ARTERY BYPASS GRAFT  2004   [place] N/A 05/21/2017   Procedure: Coronary Stent Intervention;  Surgeon: [name], MD;  Location: [place] CV LAB;  Service: Cardiovascular;  Laterality: N/A;   ENDARTERECTOMY Right 12/10/2015   Procedure: Right Carotid ENDARTERECTOMY;  Surgeon: Conrad [place], MD;  Location: [place];  Service: Vascular;  Laterality: Right;   ENDARTERECTOMY Left 12/30/2021   Procedure: LEFT CAROTID ENDARTERECTOMY;  Surgeon: Angelia Mould, MD;  Location: [facility] OR;  Service: Vascular;  Laterality: Left;   EXPLORATION POST OPERATIVE OPEN HEART     FINGER SURGERY      skin graft on rt index   FINGER SURGERY Right    right index finger.   HERNIA REPAIR Right    RIH   JOINT REPLACEMENT     KNEE SURGERY     replacement on both knees   LEFT HEART CATH AND CORS/GRAFTS ANGIOGRAPHY N/A 05/21/2017   Procedure: Left Heart Cath and Cors/Grafts Angiography;  Surgeon: [name], MD;  Location: [place] CV LAB;  Service: Cardiovascular;  Laterality: N/A;   LEFT HEART CATH AND CORS/GRAFTS ANGIOGRAPHY N/A 05/20/2020   Procedure: LEFT HEART CATH AND CORS/GRAFTS ANGIOGRAPHY;  Surgeon: [name], MD;  Location: [place] CV LAB;  Service: Cardiovascular;  Laterality: N/A;   PACEMAKER IMPLANT N/A 03/14/2020   Procedure: PACEMAKER IMPLANT;  Surgeon: Evans Lance, MD;  Location: [place] CV LAB;  Service: Cardiovascular;  Laterality: N/A;   PROSTATE SURGERY     PVC ABLATION N/A 10/27/2017   Procedure: PVC ABLATION;  Surgeon: Evans Lance, MD;  Location: [place] CV LAB;  Service:  Cardiovascular;  Laterality: N/A;   TONSILLECTOMY     URINARY SURGERY     scar tissue    Allergies  Allergies  Allergen Reactions   Lisinopril Swelling and Other (See Comments)    Angioedema.    Losartan Swelling and Other (See Comments)    Lips swell Angioedema    Nifedipine Swelling and Other (See Comments)    Sugar increase   Triamterene Swelling and Other (See Comments)    Face swells, no breathing impairment   Hydralazine Hcl Other (See Comments)    Fatigue; poor appetite   Losartan Potassium-Hctz Swelling and Other (See Comments)   Amoxicillin Rash and Other (See Comments)    Did it involve swelling of the face/tongue/throat, SOB, or low BP? No Did it involve sudden or severe rash/hives, skin peeling, or any reaction on the inside of your mouth or nose? No Did you need to seek medical attention at a hospital or doctor's office? No When did it last happen?      10 + years If all above answers are "NO", may proceed with cephalosporin use.      Ampicillin Rash and Other (See Comments)    Did it involve swelling of the face/tongue/throat, SOB, or low BP? No Did it involve sudden or severe rash/hives, skin peeling, or any reaction on the inside of your mouth or nose? No Did you need to seek medical attention at a hospital or doctor's office? No When did it last happen?      10 + years If all above answers are "NO", may proceed with cephalosporin use.    Carvedilol Other (See Comments)    Low heart rate (in higher doses)    EKGs/Labs/Other Studies Reviewed:   The following studies were reviewed today:  Echocardiogram 12/10/21 IMPRESSIONS     1. Left ventricular ejection fraction, by estimation, is 55 to 60%. The  left ventricle has normal function. The left ventricle has no regional  wall motion abnormalities. Left ventricular diastolic function could not  be evaluated.   2. Right ventricular systolic function is normal. The right ventricular  size is normal. There is normal pulmonary artery systolic pressure. The  estimated right ventricular systolic pressure is 44.0 mmHg.   3. Left atrial size was severely dilated.   4. The mitral valve is degenerative. Mild mitral valve regurgitation.  Moderate mitral annular calcification.   5. The aortic valve is tricuspid. There is moderate calcification of the  aortic valve. There is moderate thickening of the aortic valve. Aortic  valve regurgitation is not visualized. Mild aortic valve stenosis. Aortic  valve area, by VTI measures 1.77  cm. Aortic valve mean gradient measures 5.0 mmHg. Aortic valve Vmax  measures 1.67 m/s.   6. Aortic dilatation noted. There is mild dilatation of the aortic root,  measuring 41 mm. There is severe dilatation of the ascending aorta,  measuring 44 mm.   7. The inferior vena cava is normal in size with greater than 50%  respiratory variability, suggesting right atrial pressure of 3 mmHg.   Comparison(s): Changes from prior study are noted. Aortic  stenosis is  mild. Low flow noted (SVI=22 cc/m2).   FINDINGS   Left Ventricle: Left ventricular ejection fraction, by estimation, is 55  to 60%. The left ventricle has normal function. The left ventricle has no  regional wall motion abnormalities. The left ventricular internal cavity  size was normal in size.  Suboptimal image quality limits for assessment of left ventricular  hypertrophy.  Abnormal (paradoxical) septal motion consistent with  post-operative status. Left ventricular diastolic function could not be  evaluated due to atrial fibrillation. Left  ventricular diastolic function could not be evaluated.   Right Ventricle: The right ventricular size is normal. No increase in  right ventricular wall thickness. Right ventricular systolic function is  normal. There is normal pulmonary artery systolic pressure. The tricuspid  regurgitant velocity is 2.76 m/s, and   with an assumed right atrial pressure of 3 mmHg, the estimated right  ventricular systolic pressure is 67.6 mmHg.   Left Atrium: Left atrial size was severely dilated.   Right Atrium: Right atrial size was normal in size.   Pericardium: Trivial pericardial effusion is present.   Mitral Valve: The mitral valve is degenerative in appearance. Moderate  mitral annular calcification. Mild mitral valve regurgitation.   Tricuspid Valve: The tricuspid valve is grossly normal. Tricuspid valve  regurgitation is mild . No evidence of tricuspid stenosis.   Aortic Valve: The aortic valve is tricuspid. There is moderate  calcification of the aortic valve. There is moderate thickening of the  aortic valve. Aortic valve regurgitation is not visualized. Mild aortic  stenosis is present. Aortic valve mean gradient  measures 5.0 mmHg. Aortic valve peak gradient measures 11.2 mmHg. Aortic  valve area, by VTI measures 1.77 cm.   Pulmonic Valve: The pulmonic valve was grossly normal. Pulmonic valve  regurgitation is trivial. No  evidence of pulmonic stenosis.   Aorta: Aortic dilatation noted. There is mild dilatation of the aortic  root, measuring 41 mm. There is severe dilatation of the ascending aorta,  measuring 44 mm.   Venous: The inferior vena cava is normal in size with greater than 50%  respiratory variability, suggesting right atrial pressure of 3 mmHg.   IAS/Shunts: The atrial septum is grossly normal.       EKG:  EKG is  ordered today.  The ekg ordered today demonstrates atrial fibrillation, rate 75 bpm, right bundle branch block  Recent Labs: 12/31/2021: Hemoglobin 11.1; Platelets 84 10/14/2022: ALT 19; BUN 25; Creatinine, Ser 1.59; Potassium 4.8; Sodium 138  Recent Lipid Panel    Component Value Date/Time   CHOL 121 10/14/2022 1412   TRIG 101.0 10/14/2022 1412   HDL 39.90 10/14/2022 1412   CHOLHDL 3 10/14/2022 1412   VLDL 20.2 10/14/2022 1412   LDLCALC 61 10/14/2022 1412   LDLDIRECT 67.0 09/16/2016 1455    Risk Assessment/Calculations:   CHA2DS2-VASc Score = 4   This indicates a 4.8% annual risk of stroke. The patient's score is based upon: CHF History: 0 HTN History: 1 Diabetes History: 0 Stroke History: 0 Vascular Disease History: 1 Age Score: 2 Gender Score: 0     Home Medications   Current Meds  Medication Sig   ACCU-CHEK GUIDE test strip USE 1 STRIP once DAILY   Accu-Chek Softclix Lancets lancets USE 1 once DAILY USE AS DIRECTED E11.9   apixaban (ELIQUIS) 2.5 MG TABS tablet Take 1 tablet (2.5 mg total) by mouth 2 (two) times daily. (0800 & 2000)   aspirin EC 81 MG tablet Take 1 tablet (81 mg total) by mouth daily.   atorvastatin (LIPITOR) 40 MG tablet Take 40 mg by mouth at bedtime.    carvedilol (COREG) 6.25 MG tablet Take 1 tablet (6.25 mg total) by mouth 2 (two) times daily.   cholecalciferol (VITAMIN D3) 25 MCG (1000 UNIT) tablet Take 1,000 Units by mouth every evening.   hydrALAZINE (APRESOLINE) 10 MG tablet Take 10 mg  by mouth 3 (three) times daily as needed  (elevated bp greater than 160).   magnesium oxide (MAG-OX) 400 MG tablet Take 800 mg by mouth 2 (two) times daily.    meclizine (ANTIVERT) 25 MG tablet Take 1 tablet (25 mg total) by mouth 3 (three) times daily as needed for dizziness.   Multiple Vitamins-Minerals (MENS 50+ MULTI VITAMIN/MIN PO) Take by mouth daily at 6 (six) AM.   nitroGLYCERIN (NITROSTAT) 0.4 MG SL tablet Place 1 tablet (0.4 mg total) under the tongue every 5 (five) minutes as needed for chest pain (x 3 doses).   OMEPRAZOLE PO Take 1 capsule by mouth daily.   pantoprazole (PROTONIX) 40 MG tablet Take 1 tablet (40 mg total) by mouth daily.   terazosin (HYTRIN) 1 MG capsule Take 1 mg by mouth at bedtime.    triamcinolone ointment (KENALOG) 0.5 % Apply topically 3 (three) times daily.   vitamin C (ASCORBIC ACID) 500 MG tablet Take 500 mg by mouth daily with breakfast.    Zinc 22.5 MG TABS Take 22.5 mg by mouth 2 (two) times a week.     Review of Systems      All other systems reviewed and are otherwise negative except as noted above.  Physical Exam    VS:  BP (!) 152/94   Pulse 75   Ht '6\' 2"'$  (1.88 m)   Wt 215 lb (97.5 kg)   SpO2 98%   BMI 27.60 kg/m  , BMI Body mass index is 27.6 kg/m.  Wt Readings from Last 3 Encounters:  12/22/22 215 lb (97.5 kg)  11/26/22 212 lb (96.2 kg)  10/14/22 213 lb (96.6 kg)     GEN: Well nourished, well developed, in no acute distress. HEENT: normal. Neck: Supple, no JVD, carotid bruits, or masses. Cardiac: irregular irregular, no murmurs, rubs, or gallops. No clubbing, cyanosis, edema.  Radials/PT 2+ and equal bilaterally.  Respiratory:  Respirations regular and unlabored, clear to auscultation bilaterally. GI: Soft, nontender, nondistended. MS: No deformity or atrophy. Skin: Warm and dry, no rash. Neuro:  Strength and sensation are intact. Psych: Normal affect.  Assessment & Plan    Aortic stenosis -no syncope/ presyncope -no dizziness/lightheadedness -two pillow when he  sleeps -echo update ordered  CAD without angina/status post CABG 2004 -no chest pain or SOB -Continue current medications including Eliquis 2.5 mg twice a day aspirin 81 mg daily Lipitor 40 mg once daily, Coreg 6.25 mg twice daily, hydralazine 10 mg 3 times daily as needed, nitro as needed  Carotid artery stenosis/AAA -follows with vascular surgery  Essential hypertension -at home it runs lower 130/70 -continue to monitor at home -he has as needed hydralazine  A-fib on chronic anticoagulation -having a lot of fatigue  -he did have ablation to help with PVCs (significantly decreased his PVC load) -three years he has been in afib and would not qualify for any procedures at this point, medical management  Hyperlipidemia LDL goal less than 70 -LDL at goal -continue annual labs    Disposition: Follow up 1 year with [name], MD or APP.  Signed, Elgie Collard, PA-C 12/22/2022, 4:42 PM [facility]

## 2022-12-22 NOTE — Patient Instructions (Signed)
Medication Instructions:  Your physician recommends that you continue on your current medications as directed. Please refer to the Current Medication list given to you today.  *If you need a refill on your cardiac medications before your next appointment, please call your pharmacy*   Lab Work: None If you have labs (blood work) drawn today and your tests are completely normal, you will receive your results only by: [place] (if you have MyChart) OR A paper copy in the mail If you have any lab test that is abnormal or we need to change your treatment, we will call you to review the results.   Testing/Procedures: Your physician has requested that you have an echocardiogram. Echocardiography is a painless test that uses sound waves to create images of your heart. It provides your doctor with information about the size and shape of your heart and how well your heart's chambers and valves are working. This procedure takes approximately one hour. There are no restrictions for this procedure. Please do NOT wear cologne, perfume, aftershave, or lotions (deodorant is allowed). Please arrive 15 minutes prior to your appointment time.    Follow-Up: At [facility], you and your health needs are our priority.  As part of our continuing mission to provide you with exceptional heart care, we have created designated Provider Care Teams.  These Care Teams include your primary Cardiologist (physician) and Advanced Practice Providers (APPs -  Physician Assistants and Nurse Practitioners) who all work together to provide you with the care you need, when you need it.  Your next appointment:   1 year(s)  Provider:   [name], MD    Other Instructions Check your blood pressure daily at home, one hour after taking your morning mediation and send [place] a mychart message to let us know if your systolic (top number) is greater than 672 or if diastolic (bottom number) is greater than 90  consistently.

## 2022-12-23 ENCOUNTER — Ambulatory Visit: Payer: Medicare Other | Admitting: Nurse Practitioner

## 2022-12-29 ENCOUNTER — Ambulatory Visit: Payer: Medicare Other | Admitting: Nurse Practitioner

## 2023-01-04 DIAGNOSIS — C4442 Squamous cell carcinoma of skin of scalp and neck: Secondary | ICD-10-CM | POA: Diagnosis not present

## 2023-01-06 DIAGNOSIS — C4442 Squamous cell carcinoma of skin of scalp and neck: Secondary | ICD-10-CM | POA: Diagnosis not present

## 2023-01-11 DIAGNOSIS — L02811 Cutaneous abscess of head [any part, except face]: Secondary | ICD-10-CM | POA: Diagnosis not present

## 2023-01-18 ENCOUNTER — Ambulatory Visit (HOSPITAL_COMMUNITY): Payer: Medicare Other | Attending: Physician Assistant

## 2023-01-18 DIAGNOSIS — I350 Nonrheumatic aortic (valve) stenosis: Secondary | ICD-10-CM | POA: Diagnosis not present

## 2023-01-18 LAB — ECHOCARDIOGRAM COMPLETE
AR max vel: 1.48 cm2
AV Area VTI: 1.56 cm2
AV Area mean vel: 1.48 cm2
AV Mean grad: 7.3 mmHg
AV Peak grad: 11.5 mmHg
Ao pk vel: 1.7 m/s
Area-P 1/2: 4.96 cm2
S' Lateral: 2.9 cm

## 2023-01-25 NOTE — Progress Notes (Signed)
Remote pacemaker transmission.   

## 2023-01-28 ENCOUNTER — Ambulatory Visit (HOSPITAL_COMMUNITY)
Admission: RE | Admit: 2023-01-28 | Discharge: 2023-01-28 | Disposition: A | Discharge status: Home / Self Care | Payer: Medicare Other | Source: Ambulatory Visit | Attending: Vascular Surgery | Admitting: Vascular Surgery

## 2023-01-28 ENCOUNTER — Ambulatory Visit (INDEPENDENT_AMBULATORY_CARE_PROVIDER_SITE_OTHER): Payer: Medicare Other | Admitting: Physician Assistant

## 2023-01-28 VITALS — BP 122/72 | HR 83 | Temp 98.1°F | Resp 20 | Ht 74.0 in | Wt 212.9 lb

## 2023-01-28 DIAGNOSIS — I6523 Occlusion and stenosis of bilateral carotid arteries: Secondary | ICD-10-CM

## 2023-01-28 NOTE — Progress Notes (Signed)
History of Present Illness:  Patient is a 81 y.o. year old male who presents for evaluation of carotid stenosis.  s/p L CEA by Dr. Scot Dock on 12/30/21 due to asymptomatic high-grade carotid stenosis.  Surgical history also significant for right CEA by Dr. Bridgett Larsson in 2017 for asymptomatic stenosis.   The patient denies symptoms of TIA, amaurosis, or stroke. He is medically managed on Eliquis for Afib, ASA and Statin daily.  He feel weaker since he has been diagnosed Afib He is not very activity, but does attend church regular.    Past Medical History:  Diagnosis Date   AAA (abdominal aortic aneurysm) ([place])    Arthritis    Cancer ([place])    skin & squamous cell   Carotid artery disease ([place])    a. s/p R CEA 12/2015.   CKD (chronic kidney disease), stage III ([place])    a. per historical labs.   Coronary artery disease    a. s/p CABG 2004.   Diabetes mellitus without complication (HCC)    GERD (gastroesophageal reflux disease)    Hyperlipidemia    Hypertension    Medication intolerance Multiple    PAF (paroxysmal atrial fibrillation) (HCC)    Pneumonia    Presence of permanent cardiac pacemaker    Sinus bradycardia    a. baseline HR 50s-60s, also h/o bradycardia on metoprolol and carvedilol    Past Surgical History:  Procedure Laterality Date   ANKLE SURGERY     right fused   BACK SURGERY     low back    CARDIOVERSION N/A 02/14/2020   Procedure: CARDIOVERSION;  Surgeon: Geralynn Rile, MD;  Location: [place];  Service: Cardiovascular;  Laterality: N/A;   CARDIOVERSION N/A 02/26/2020   Procedure: CARDIOVERSION;  Surgeon: [name], MD;  Location: [facility] ENDOSCOPY;  Service: Cardiovascular;  Laterality: N/A;   CARDIOVERSION N/A 04/12/2020   Procedure: CARDIOVERSION;  Surgeon: Skeet Latch, MD;  Location: [facility] ENDOSCOPY;  Service: Cardiovascular;  Laterality: N/A;   CHOLECYSTECTOMY N/A 12/07/2013   Procedure: LAPAROSCOPIC CHOLECYSTECTOMY ;  Surgeon: [name], MD;  Location: [facility] OR;  Service: General;  Laterality: N/A;   COLONOSCOPY     CORONARY ARTERY BYPASS GRAFT  2004   [place] N/A 05/21/2017   Procedure: Coronary Stent Intervention;  Surgeon: [name], MD;  Location: [place] CV LAB;  Service: Cardiovascular;  Laterality: N/A;   ENDARTERECTOMY Right 12/10/2015   Procedure: Right Carotid ENDARTERECTOMY;  Surgeon: Conrad [place], MD;  Location: [place];  Service: Vascular;  Laterality: Right;   ENDARTERECTOMY Left 12/30/2021   Procedure: LEFT CAROTID ENDARTERECTOMY;  Surgeon: Angelia Mould, MD;  Location: [facility] OR;  Service: Vascular;  Laterality: Left;   EXPLORATION POST OPERATIVE OPEN HEART     FINGER SURGERY     skin graft on rt index   FINGER SURGERY Right    right index finger.   HERNIA REPAIR Right    RIH   JOINT REPLACEMENT     KNEE SURGERY     replacement on both knees   LEFT HEART CATH AND CORS/GRAFTS ANGIOGRAPHY N/A 05/21/2017   Procedure: Left Heart Cath and Cors/Grafts Angiography;  Surgeon: [name], MD;  Location: [place] CV LAB;  Service: Cardiovascular;  Laterality: N/A;   LEFT HEART CATH AND CORS/GRAFTS ANGIOGRAPHY N/A 05/20/2020   Procedure: LEFT HEART CATH AND CORS/GRAFTS ANGIOGRAPHY;  Surgeon: [name], MD;  Location: [place] CV LAB;  Service:  Cardiovascular;  Laterality: N/A;   PACEMAKER IMPLANT N/A 03/14/2020   Procedure: PACEMAKER IMPLANT;  Surgeon: Evans Lance, MD;  Location: [place] CV LAB;  Service: Cardiovascular;  Laterality: N/A;   PROSTATE SURGERY     PVC ABLATION N/A 10/27/2017   Procedure: PVC ABLATION;  Surgeon: Evans Lance, MD;  Location: [place] CV LAB;  Service: Cardiovascular;  Laterality: N/A;   TONSILLECTOMY     URINARY SURGERY     scar tissue     Social History Social History   Tobacco Use   Smoking status: Former    Packs/day: 1.00    Years: 13.00    Total pack years: 13.00    Types: Cigarettes     Quit date: 11/30/1970    Years since quitting: 52.1    Passive exposure: Never   Smokeless tobacco: Never  Vaping Use   Vaping Use: Never used  Substance Use Topics   Alcohol use: No    Alcohol/week: 0.0 standard drinks of alcohol   Drug use: No    Family History Family History  Problem Relation Age of Onset   Stroke Mother    Heart disease Mother    Heart disease Father    Hypertension Father    Hyperlipidemia Father    Cancer Sister        breast  and skin   Diabetes Sister    Hypertension Sister    Diabetes Brother    Heart disease Brother    Hyperlipidemia Brother    Hypertension Brother    Early death Neg Hx     Allergies  Allergies  Allergen Reactions   Lisinopril Swelling and Other (See Comments)    Angioedema.    Losartan Swelling and Other (See Comments)    Lips swell Angioedema    Nifedipine Swelling and Other (See Comments)    Sugar increase   Triamterene Swelling and Other (See Comments)    Face swells, no breathing impairment   Hydralazine Hcl Other (See Comments)    Fatigue; poor appetite   Losartan Potassium-Hctz Swelling and Other (See Comments)   Amoxicillin Rash and Other (See Comments)    Did it involve swelling of the face/tongue/throat, SOB, or low BP? No Did it involve sudden or severe rash/hives, skin peeling, or any reaction on the inside of your mouth or nose? No Did you need to seek medical attention at a hospital or doctor's office? No When did it last happen?      10 + years If all above answers are "NO", may proceed with cephalosporin use.     Ampicillin Rash and Other (See Comments)    Did it involve swelling of the face/tongue/throat, SOB, or low BP? No Did it involve sudden or severe rash/hives, skin peeling, or any reaction on the inside of your mouth or nose? No Did you need to seek medical attention at a hospital or doctor's office? No When did it last happen?      10 + years If all above answers are "NO", may proceed with  cephalosporin use.    Carvedilol Other (See Comments)    Low heart rate (in higher doses)     Current Outpatient Medications  Medication Sig Dispense Refill   ACCU-CHEK GUIDE test strip USE 1 STRIP once DAILY 100 each 3   Accu-Chek Softclix Lancets lancets USE 1 once DAILY USE AS DIRECTED E11.9 100 each 3   apixaban (ELIQUIS) 2.5 MG TABS tablet Take 1 tablet (2.5 mg total)  by mouth 2 (two) times daily. (0800 & 2000) 180 tablet 3   aspirin EC 81 MG tablet Take 1 tablet (81 mg total) by mouth daily. 90 tablet 3   atorvastatin (LIPITOR) 40 MG tablet Take 40 mg by mouth at bedtime.      carvedilol (COREG) 6.25 MG tablet Take 1 tablet (6.25 mg total) by mouth 2 (two) times daily. 180 tablet 3   cholecalciferol (VITAMIN D3) 25 MCG (1000 UNIT) tablet Take 1,000 Units by mouth every evening.     hydrALAZINE (APRESOLINE) 10 MG tablet Take 10 mg by mouth 3 (three) times daily as needed (elevated bp greater than 160).     magnesium oxide (MAG-OX) 400 MG tablet Take 800 mg by mouth 2 (two) times daily.      meclizine (ANTIVERT) 25 MG tablet Take 1 tablet (25 mg total) by mouth 3 (three) times daily as needed for dizziness. 30 tablet 0   Multiple Vitamins-Minerals (MENS 50+ MULTI VITAMIN/MIN PO) Take by mouth daily at 6 (six) AM.     nitroGLYCERIN (NITROSTAT) 0.4 MG SL tablet Place 1 tablet (0.4 mg total) under the tongue every 5 (five) minutes as needed for chest pain (x 3 doses). 30 tablet 3   OMEPRAZOLE PO Take 1 capsule by mouth daily.     pantoprazole (PROTONIX) 40 MG tablet Take 1 tablet (40 mg total) by mouth daily. 90 tablet 3   terazosin (HYTRIN) 1 MG capsule Take 1 mg by mouth at bedtime.  30 capsule 11   triamcinolone ointment (KENALOG) 0.5 % Apply topically 3 (three) times daily.     vitamin C (ASCORBIC ACID) 500 MG tablet Take 500 mg by mouth daily with breakfast.      Zinc 22.5 MG TABS Take 22.5 mg by mouth 2 (two) times a week.     No current facility-administered medications for this  visit.    ROS:   General:  No weight loss, Fever, chills  HEENT: No recent headaches, no nasal bleeding, no visual changes, no sore throat  Neurologic: No dizziness, blackouts, seizures. No recent symptoms of stroke or mini- stroke. No recent episodes of slurred speech, or temporary blindness.  Cardiac: No recent episodes of chest pain/pressure, no shortness of breath at rest.  No shortness of breath with exertion.  Positive history of atrial fibrillation or irregular heartbeat  Vascular: No history of rest pain in feet.  No history of claudication.  No history of non-healing ulcer, No history of DVT   Pulmonary: No home oxygen, no productive cough, no hemoptysis,  No asthma or wheezing  Musculoskeletal:  '[ ]'$  Arthritis, '[ ]'$  Low back pain,  [x ] Joint pain  Hematologic:No history of hypercoagulable state.  No history of easy bleeding.  No history of anemia  Gastrointestinal: No hematochezia or melena,  No gastroesophageal reflux, no trouble swallowing  Urinary: '[ ]'$  chronic Kidney disease, '[ ]'$  on HD - '[ ]'$  MWF or '[ ]'$  TTHS, '[ ]'$  Burning with urination, '[ ]'$  Frequent urination, '[ ]'$  Difficulty urinating;   Skin: No rashes  Psychological: No history of anxiety,  No history of depression   Physical Examination  Vitals:   01/28/23 1451 01/28/23 1453  BP: 127/76 122/72  Pulse: 83   Resp: 20   Temp: 98.1 F (36.7 C)   TempSrc: Temporal   SpO2: 98%   Weight: 212 lb 14.4 oz (96.6 kg)   Height: '6\' 2"'$  (1.88 m)     Body mass index is 27.33  kg/m.  General:  Alert and oriented, no acute distress HEENT: Normal Neck: No bruit or JVD Pulmonary: Clear to auscultation bilaterally Cardiac: Regular Rate and Rhythm without murmur Gastrointestinal: Soft, non-tender, non-distended, no mass, no scars Skin: No rash Extremity Pulses:   radial pulses bilaterally Musculoskeletal: No deformity or edema  Neurologic: Upper and lower extremity motor grossly intact and symmetric  DATA:  Right  Carotid Findings:  +----------+--------+--------+--------+------------------+--------+           PSV cm/sEDV cm/sStenosisPlaque DescriptionComments  +----------+--------+--------+--------+------------------+--------+  CCA Prox  49      5               heterogenous                +----------+--------+--------+--------+------------------+--------+  CCA Distal61      7               heterogenous                +----------+--------+--------+--------+------------------+--------+  ICA Prox  88      19      1-39%   heterogenous                +----------+--------+--------+--------+------------------+--------+  ICA Mid   87      16                                          +----------+--------+--------+--------+------------------+--------+  ICA Distal84      29                                          +----------+--------+--------+--------+------------------+--------+  ECA      90                                                  +----------+--------+--------+--------+------------------+--------+   +----------+--------+-------+--------+-------------------+           PSV cm/sEDV cmsDescribeArm Pressure (mmHG)  +----------+--------+-------+--------+-------------------+  [id number]                                         +----------+--------+-------+--------+-------------------+   +---------+--------+--+--------+-+---------+  VertebralPSV cm/s40EDV cm/s7Antegrade  +---------+--------+--+--------+-+---------+      Left Carotid Findings:  +----------+--------+--------+--------+------------------+--------+           PSV cm/sEDV cm/sStenosisPlaque DescriptionComments  +----------+--------+--------+--------+------------------+--------+  CCA Prox  72      17              heterogenous                +----------+--------+--------+--------+------------------+--------+  CCA Distal66      14              heterogenous                 +----------+--------+--------+--------+------------------+--------+  ICA Prox  67      20      1-39%   heterogenous                +----------+--------+--------+--------+------------------+--------+  ICA Mid   64      23                                          +----------+--------+--------+--------+------------------+--------+  ICA Distal74      23                                          +----------+--------+--------+--------+------------------+--------+  ECA      107                                                 +----------+--------+--------+--------+------------------+--------+   +----------+--------+--------+--------+-------------------+           PSV cm/sEDV cm/sDescribeArm Pressure (mmHG)  +----------+--------+--------+--------+-------------------+  [id number]                                          +----------+--------+--------+--------+-------------------+   +---------+--------+--+--------+-+---------+  VertebralPSV cm/s36EDV cm/s8Antegrade  +---------+--------+--+--------+-+---------+    Summary:  Right Carotid: Velocities in the right ICA are consistent with a 1-39%  stenosis.   Left Carotid: Velocities in the left ICA are consistent with a 1-39%  stenosis.   Vertebrals: Bilateral vertebral arteries demonstrate antegrade flow.    ASSESSMENT/PLAN:  This is a 81 y.o. male who is s/p L CEA by Dr. Scot Dock on 12/30/21 due to asymptomatic high-grade carotid stenosis.  Surgical history also significant for right CEA by Dr. Bridgett Larsson in 2017 for asymptomatic stenosis.   B ICA are < 39% stenosis.  He remains asymptomatic for symptoms of stroke/TIA.  If he develops signs or symptoms of stroke he will call 911.   He is also followed for AAA with the last diameter 3.5 cm in diameter.  He denise abd/lumbar he has a 2 year f/u duplex in May 2024.     Roxy Horseman PA-C [facility]  [place] Office: [phone]  MD in clinic [place]

## 2023-01-29 ENCOUNTER — Other Ambulatory Visit: Payer: Self-pay

## 2023-01-29 DIAGNOSIS — I7143 Infrarenal abdominal aortic aneurysm, without rupture: Secondary | ICD-10-CM

## 2023-02-12 ENCOUNTER — Ambulatory Visit (INDEPENDENT_AMBULATORY_CARE_PROVIDER_SITE_OTHER): Payer: Medicare Other | Admitting: Podiatry

## 2023-02-12 DIAGNOSIS — E1140 Type 2 diabetes mellitus with diabetic neuropathy, unspecified: Secondary | ICD-10-CM

## 2023-02-12 DIAGNOSIS — M2042 Other hammer toe(s) (acquired), left foot: Secondary | ICD-10-CM

## 2023-02-12 DIAGNOSIS — M79675 Pain in left toe(s): Secondary | ICD-10-CM | POA: Diagnosis not present

## 2023-02-12 DIAGNOSIS — B351 Tinea unguium: Secondary | ICD-10-CM

## 2023-02-12 DIAGNOSIS — L84 Corns and callosities: Secondary | ICD-10-CM

## 2023-02-12 DIAGNOSIS — M79674 Pain in right toe(s): Secondary | ICD-10-CM

## 2023-02-12 DIAGNOSIS — Z7901 Long term (current) use of anticoagulants: Secondary | ICD-10-CM

## 2023-02-12 DIAGNOSIS — M216X1 Other acquired deformities of right foot: Secondary | ICD-10-CM

## 2023-02-12 DIAGNOSIS — M216X2 Other acquired deformities of left foot: Secondary | ICD-10-CM

## 2023-02-12 DIAGNOSIS — M2041 Other hammer toe(s) (acquired), right foot: Secondary | ICD-10-CM

## 2023-02-14 NOTE — Progress Notes (Signed)
Patient ID: Thomas Marshall, male   DOB: October 10, 1941, 82 y.o.   MRN: [id number]  Subjective:  Chief Complaint  Patient presents with   routine foot care     Patient was told that he can place an order for diabetic shoes today, needs measurements done      82 y.o. returns the office today for painful, elongated, thickened toenails which he cannot trim himself and for calluses on the right foot.  Does not report any open lesions.  No swelling redness or any drainage.  He is on Eliquis  Last A1c was 7.3 on October 14, 2022  Thomas Borg, MD Last seen November 18, 2022   Objective: AAO 3, NAD DP/PT pulses palpable, CRT less than 3 seconds Protective sensation decreased with Thomas Marshall monofilament Nails hypertrophic, dystrophic, elongated, brittle, discolored 10. There is tenderness overlying the nails 1-5 bilaterally. There is no surrounding erythema or drainage along the nail sites. There is hyperkeratotic lesion right fifth metatarsal base and fifth metatarsal head. No ulceration, drainage or any signs of infection.  No pain with calf compression, swelling, warmth, erythema.  Assessment: Symptomatic onychomycosis; hyperkeratotic tissue  Plan: -Treatment options including alternatives, risks, complications were discussed -Nails sharply debrided 10 without complication/bleeding. -Sharply debrided the hyperkeratotic lesions x 2 right foot any complications or bleeding. -Given new diabetic shoes.  Unfortunately we will measure today and he will be scheduled for this. -Discussed daily foot inspection. -Follow-up in 3 months or sooner if any problems are to arise. In the meantime, encouraged to call the office with any questions, concerns, changes symptoms.  Celesta Gentile, DPM

## 2023-02-16 DIAGNOSIS — L821 Other seborrheic keratosis: Secondary | ICD-10-CM | POA: Diagnosis not present

## 2023-02-16 DIAGNOSIS — L570 Actinic keratosis: Secondary | ICD-10-CM | POA: Diagnosis not present

## 2023-02-16 DIAGNOSIS — C4442 Squamous cell carcinoma of skin of scalp and neck: Secondary | ICD-10-CM | POA: Diagnosis not present

## 2023-03-02 ENCOUNTER — Ambulatory Visit (INDEPENDENT_AMBULATORY_CARE_PROVIDER_SITE_OTHER): Payer: Medicare Other

## 2023-03-02 DIAGNOSIS — M216X1 Other acquired deformities of right foot: Secondary | ICD-10-CM

## 2023-03-02 DIAGNOSIS — L84 Corns and callosities: Secondary | ICD-10-CM

## 2023-03-02 DIAGNOSIS — E1140 Type 2 diabetes mellitus with diabetic neuropathy, unspecified: Secondary | ICD-10-CM

## 2023-03-02 DIAGNOSIS — M2041 Other hammer toe(s) (acquired), right foot: Secondary | ICD-10-CM

## 2023-03-02 DIAGNOSIS — M2042 Other hammer toe(s) (acquired), left foot: Secondary | ICD-10-CM

## 2023-03-02 DIAGNOSIS — M216X2 Other acquired deformities of left foot: Secondary | ICD-10-CM

## 2023-03-02 NOTE — Progress Notes (Signed)
Patient presents to the office today for diabetic shoe and insole measuring.  Patient was measured with brannock device to determine size and width for 1 pair of extra depth shoes and foam casted for 3 pair of insoles.   ABN signed.   Documentation of medical necessity will be sent to patient's treating diabetic doctor to verify and sign.   Patient's diabetic provider: [name], MD   Shoes and insoles will be ordered at that time and patient will be notified for an appointment for fitting when they arrive.   Patient shoe selection-   1st   Shoe choice:   [id number]  Shoe size ordered: 12W

## 2023-03-15 ENCOUNTER — Ambulatory Visit (INDEPENDENT_AMBULATORY_CARE_PROVIDER_SITE_OTHER): Payer: Medicare Other

## 2023-03-15 DIAGNOSIS — I495 Sick sinus syndrome: Secondary | ICD-10-CM

## 2023-03-16 LAB — CUP PACEART REMOTE DEVICE CHECK
Battery Remaining Longevity: 113 mo
Battery Remaining Percentage: 80 %
Battery Voltage: 3.02 V
Brady Statistic RV Percent Paced: 1 %
Date Time Interrogation Session: 20240415020013
Implantable Lead Connection Status: 753985
Implantable Lead Connection Status: 753985
Implantable Lead Implant Date: 20210415
Implantable Lead Implant Date: 20210415
Implantable Lead Location: 753859
Implantable Lead Location: 753860
Implantable Pulse Generator Implant Date: 20210415
Lead Channel Impedance Value: 480 Ohm
Lead Channel Pacing Threshold Amplitude: 0.5 V
Lead Channel Pacing Threshold Pulse Width: 0.5 ms
Lead Channel Sensing Intrinsic Amplitude: 8.4 mV
Lead Channel Setting Pacing Amplitude: 2.5 V
Lead Channel Setting Pacing Pulse Width: 0.5 ms
Lead Channel Setting Sensing Sensitivity: 2 mV
Pulse Gen Model: 2272
Pulse Gen Serial Number: 3813712

## 2023-03-31 DIAGNOSIS — D631 Anemia in chronic kidney disease: Secondary | ICD-10-CM | POA: Diagnosis not present

## 2023-03-31 DIAGNOSIS — N189 Chronic kidney disease, unspecified: Secondary | ICD-10-CM | POA: Diagnosis not present

## 2023-03-31 DIAGNOSIS — I4811 Longstanding persistent atrial fibrillation: Secondary | ICD-10-CM | POA: Diagnosis not present

## 2023-03-31 DIAGNOSIS — N2581 Secondary hyperparathyroidism of renal origin: Secondary | ICD-10-CM | POA: Diagnosis not present

## 2023-03-31 DIAGNOSIS — N1832 Chronic kidney disease, stage 3b: Secondary | ICD-10-CM | POA: Diagnosis not present

## 2023-03-31 DIAGNOSIS — D696 Thrombocytopenia, unspecified: Secondary | ICD-10-CM | POA: Diagnosis not present

## 2023-03-31 DIAGNOSIS — R809 Proteinuria, unspecified: Secondary | ICD-10-CM | POA: Diagnosis not present

## 2023-03-31 DIAGNOSIS — I129 Hypertensive chronic kidney disease with stage 1 through stage 4 chronic kidney disease, or unspecified chronic kidney disease: Secondary | ICD-10-CM | POA: Diagnosis not present

## 2023-03-31 DIAGNOSIS — E1122 Type 2 diabetes mellitus with diabetic chronic kidney disease: Secondary | ICD-10-CM | POA: Diagnosis not present

## 2023-04-02 ENCOUNTER — Ambulatory Visit (INDEPENDENT_AMBULATORY_CARE_PROVIDER_SITE_OTHER): Payer: Medicare Other

## 2023-04-02 ENCOUNTER — Ambulatory Visit (INDEPENDENT_AMBULATORY_CARE_PROVIDER_SITE_OTHER): Payer: Medicare Other | Admitting: Family Medicine

## 2023-04-02 ENCOUNTER — Other Ambulatory Visit: Payer: Self-pay

## 2023-04-02 ENCOUNTER — Ambulatory Visit: Payer: Medicare Other

## 2023-04-02 VITALS — BP 148/88 | HR 97 | Ht 74.0 in | Wt 215.0 lb

## 2023-04-02 DIAGNOSIS — M79672 Pain in left foot: Secondary | ICD-10-CM | POA: Diagnosis not present

## 2023-04-02 DIAGNOSIS — M25572 Pain in left ankle and joints of left foot: Secondary | ICD-10-CM | POA: Diagnosis not present

## 2023-04-02 NOTE — Patient Instructions (Addendum)
Thank you for coming in today.   Try an ASO ankle brace  Please complete the exercises that the athletic trainer went over with you:  View at [email or web address] using code: [email or web address]  Check back in 1 month

## 2023-04-02 NOTE — Progress Notes (Signed)
Thomas Payor, PhD, LAT, ATC acting as a scribe for Thomas Graham, MD.  Subjective:    CC: L foot pain  HPI: Pt is an 82 y/o male c/o L foot pain ongoing since yesterday. Pt suffered a fall, when walking up some concrete stairs, he lost his balance, he pushed off to avoid falling on the steps, landing in the yard . Pt locates pain to the lateral aspect of his L ankle/foot. He also sustained several abrasions on his face/head. No LOC. No HA, dizziness, confusion.   L foot swelling: yes Aggravates: walking, any ankle motions Treatments tried: Epsom salt soaks, ice, Tylenol  Pertinent review of Systems: No fevers or chills  Relevant historical information: Heart failure.  Aneurysm. Diabetes  Objective:    Vitals:   04/02/23 1018  BP: (!) 148/88  Pulse: 97  SpO2: 97%   General: Well Developed, well nourished, and in no acute distress.   MSK: Left ankle swollen left lateral ankle otherwise normal. Normal foot and ankle motion. Tender palpation lateral malleolus. Some pain and weakness with resisted foot eversion. Otherwise strength is intact. Stability intact. Capillary fill and pulses are intact distally.  Lab and Radiology Results  Diagnostic Limited MSK Ultrasound of: Left lateral ankle Peroneal tendons are intact and normal-appearing Lateral ankle joint mild effusion and subcutaneous swelling. Possible avulsion fleck present at the lateral ankle joint appears to be originating from the talus Impression: Possible avulsion fracture  X-ray images foot and ankle obtained today personally and independently interpreted.  Left ankle: No acute fractures are visible.  Mild to moderate degenerative changes present.  Left foot: Again no acute fractures.  Degenerative changes present throughout midfoot.  Await formal radiology review   Impression and Recommendations:    Assessment and Plan: 82 y.o. male with left lateral ankle pain after a fall.  Most likely  explanation is a simple ankle sprain.  On ultrasound he may have an avulsion at the talus.  I do not see this on x-ray however.  I think what may be visible is a bone spur or an old avulsion fracture or loose body.  Plan for ASO ankle brace and home exercise program.  Recheck in a month.  If not better consider formal physical therapy or even MRI.[name] Avoid oral NSAIDs given diabetes and heart disease.  PDMP not reviewed this encounter. Orders Placed This Encounter  Procedures   DG Foot Complete Left    Standing Status:   Future    Number of Occurrences:   1    Standing Expiration Date:   04/01/2024    Order Specific Question:   Reason for Exam (SYMPTOM  OR DIAGNOSIS REQUIRED)    Answer:   left foot pain    Order Specific Question:   Preferred imaging location?    Answer:   [facility]   DG Ankle Complete Left    Standing Status:   Future    Number of Occurrences:   1    Standing Expiration Date:   04/01/2024    Order Specific Question:   Reason for Exam (SYMPTOM  OR DIAGNOSIS REQUIRED)    Answer:   left foot pain    Order Specific Question:   Preferred imaging location?    Answer:   [facility]   [place] LIMITED JOINT SPACE STRUCTURES LOW LEFT(NO LINKED CHARGES)    Order Specific Question:   Reason for Exam (SYMPTOM  OR DIAGNOSIS REQUIRED)    Answer:   left foot  pain    Order Specific Question:   Preferred imaging location?    Answer:   [facility]   No orders of the defined types were placed in this encounter.   Discussed warning signs or symptoms. Please see discharge instructions. Patient expresses understanding.   The above documentation has been reviewed and is accurate and complete Thomas Marshall, M.D.

## 2023-04-07 NOTE — Progress Notes (Signed)
Left foot x-ray shows some arthritis at the big toe.

## 2023-04-07 NOTE — Progress Notes (Signed)
Left ankle x-ray shows no fractures.  There is some heel spurs.  Otherwise the ankle looks okay.

## 2023-04-14 ENCOUNTER — Ambulatory Visit: Payer: Medicare Other | Admitting: Internal Medicine

## 2023-04-16 ENCOUNTER — Ambulatory Visit (INDEPENDENT_AMBULATORY_CARE_PROVIDER_SITE_OTHER): Payer: Medicare Other | Admitting: Podiatry

## 2023-04-16 ENCOUNTER — Encounter: Payer: Self-pay | Admitting: Podiatry

## 2023-04-16 DIAGNOSIS — B351 Tinea unguium: Secondary | ICD-10-CM | POA: Diagnosis not present

## 2023-04-16 DIAGNOSIS — Z7901 Long term (current) use of anticoagulants: Secondary | ICD-10-CM | POA: Diagnosis not present

## 2023-04-16 DIAGNOSIS — M79674 Pain in right toe(s): Secondary | ICD-10-CM

## 2023-04-16 DIAGNOSIS — L84 Corns and callosities: Secondary | ICD-10-CM

## 2023-04-16 DIAGNOSIS — M79675 Pain in left toe(s): Secondary | ICD-10-CM

## 2023-04-16 NOTE — Progress Notes (Signed)
Remote pacemaker transmission.   

## 2023-04-21 ENCOUNTER — Ambulatory Visit (INDEPENDENT_AMBULATORY_CARE_PROVIDER_SITE_OTHER): Payer: Medicare Other | Admitting: Internal Medicine

## 2023-04-21 ENCOUNTER — Encounter: Payer: Self-pay | Admitting: Internal Medicine

## 2023-04-21 VITALS — BP 112/64 | HR 60 | Temp 98.1°F | Ht 74.0 in | Wt 214.0 lb

## 2023-04-21 DIAGNOSIS — E559 Vitamin D deficiency, unspecified: Secondary | ICD-10-CM

## 2023-04-21 DIAGNOSIS — E538 Deficiency of other specified B group vitamins: Secondary | ICD-10-CM

## 2023-04-21 DIAGNOSIS — E1122 Type 2 diabetes mellitus with diabetic chronic kidney disease: Secondary | ICD-10-CM | POA: Diagnosis not present

## 2023-04-21 DIAGNOSIS — I10 Essential (primary) hypertension: Secondary | ICD-10-CM

## 2023-04-21 DIAGNOSIS — E7800 Pure hypercholesterolemia, unspecified: Secondary | ICD-10-CM

## 2023-04-21 DIAGNOSIS — N1832 Chronic kidney disease, stage 3b: Secondary | ICD-10-CM | POA: Diagnosis not present

## 2023-04-21 LAB — HEPATIC FUNCTION PANEL
ALT: 15 U/L (ref 0–53)
AST: 19 U/L (ref 0–37)
Albumin: 4 g/dL (ref 3.5–5.2)
Alkaline Phosphatase: 83 U/L (ref 39–117)
Bilirubin, Direct: 0.2 mg/dL (ref 0.0–0.3)
Total Bilirubin: 0.9 mg/dL (ref 0.2–1.2)
Total Protein: 6.6 g/dL (ref 6.0–8.3)

## 2023-04-21 LAB — CBC WITH DIFFERENTIAL/PLATELET
Basophils Absolute: 0 10*3/uL (ref 0.0–0.1)
Basophils Relative: 0.8 % (ref 0.0–3.0)
Eosinophils Absolute: 0.6 10*3/uL (ref 0.0–0.7)
Eosinophils Relative: 10 % — ABNORMAL HIGH (ref 0.0–5.0)
HCT: 42 % (ref 39.0–52.0)
Hemoglobin: 13.6 g/dL (ref 13.0–17.0)
Lymphocytes Relative: 26.8 % (ref 12.0–46.0)
Lymphs Abs: 1.6 10*3/uL (ref 0.7–4.0)
MCHC: 32.4 g/dL (ref 30.0–36.0)
MCV: 99.6 fl (ref 78.0–100.0)
Monocytes Absolute: 0.6 10*3/uL (ref 0.1–1.0)
Monocytes Relative: 10.1 % (ref 3.0–12.0)
Neutro Abs: 3.1 10*3/uL (ref 1.4–7.7)
Neutrophils Relative %: 52.3 % (ref 43.0–77.0)
Platelets: 122 10*3/uL — ABNORMAL LOW (ref 150.0–400.0)
RBC: 4.21 Mil/uL — ABNORMAL LOW (ref 4.22–5.81)
RDW: 13.1 % (ref 11.5–15.5)
WBC: 5.9 10*3/uL (ref 4.0–10.5)

## 2023-04-21 LAB — LIPID PANEL
Cholesterol: 115 mg/dL (ref 0–200)
HDL: 40.1 mg/dL (ref >39.00)
LDL Cholesterol: 58 mg/dL (ref 0–99)
NonHDL: 74.5
Total CHOL/HDL Ratio: 3
Triglycerides: 82 mg/dL (ref 0.0–149.0)
VLDL: 16.4 mg/dL (ref 0.0–40.0)

## 2023-04-21 LAB — URINALYSIS, ROUTINE W REFLEX MICROSCOPIC
Bilirubin Urine: NEGATIVE
Hgb urine dipstick: NEGATIVE
Ketones, ur: NEGATIVE
Leukocytes,Ua: NEGATIVE
Nitrite: NEGATIVE
RBC / HPF: NONE SEEN (ref 0–?)
Specific Gravity, Urine: 1.02 (ref 1.000–1.030)
Total Protein, Urine: NEGATIVE
Urine Glucose: NEGATIVE
Urobilinogen, UA: 0.2 (ref 0.0–1.0)
pH: 6 (ref 5.0–8.0)

## 2023-04-21 LAB — BASIC METABOLIC PANEL
BUN: 31 mg/dL — ABNORMAL HIGH (ref 6–23)
CO2: 31 mEq/L (ref 19–32)
Calcium: 9.2 mg/dL (ref 8.4–10.5)
Chloride: 103 mEq/L (ref 96–112)
Creatinine, Ser: 1.68 mg/dL — ABNORMAL HIGH (ref 0.40–1.50)
GFR: 37.27 mL/min — ABNORMAL LOW (ref >60.00)
Glucose, Bld: 115 mg/dL — ABNORMAL HIGH (ref 70–99)
Potassium: 5.2 mEq/L — ABNORMAL HIGH (ref 3.5–5.1)
Sodium: 140 mEq/L (ref 135–145)

## 2023-04-21 LAB — VITAMIN B12: Vitamin B-12: 1236 pg/mL — ABNORMAL HIGH (ref 211–911)

## 2023-04-21 LAB — MICROALBUMIN / CREATININE URINE RATIO
Creatinine,U: 161.6 mg/dL
Microalb Creat Ratio: 3 mg/g (ref 0.0–30.0)
Microalb, Ur: 4.9 mg/dL — ABNORMAL HIGH (ref 0.0–1.9)

## 2023-04-21 LAB — VITAMIN D 25 HYDROXY (VIT D DEFICIENCY, FRACTURES): VITD: 66.63 ng/mL (ref 30.00–100.00)

## 2023-04-21 LAB — HEMOGLOBIN A1C: Hgb A1c MFr Bld: 7.3 % — ABNORMAL HIGH (ref 4.6–6.5)

## 2023-04-21 LAB — TSH: TSH: 1.72 u[IU]/mL (ref 0.35–5.50)

## 2023-04-21 NOTE — Progress Notes (Signed)
Patient ID: Thomas Marshall, male   DOB: 1941/03/16, 82 y.o.   MRN: [id number]  Subjective:  Chief Complaint  Patient presents with   Debridement    Trim toenails/PUDS     82 y.o. returns the office today for painful, elongated, thickened toenails which he cannot trim himself and for calluses on the right foot.  Also do cause some discomfort.  No open lesions.  No increase in swelling or redness.  He did see sports medicine for follow-up.  Pain is improved.  He is on Eliquis  Last A1c was 7.3 on October 14, 2022  [name], MD Last seen November 18, 2022   Objective: AAO 3, NAD DP/PT pulses palpable, CRT less than 3 seconds Protective sensation decreased with Dorann Ou monofilament Nails hypertrophic, dystrophic, elongated, brittle, discolored 10. There is tenderness overlying the nails 1-5 bilaterally. There is no surrounding erythema or drainage along the nail sites. There is hyperkeratotic lesion right fifth metatarsal base and fifth metatarsal head. No ulceration, drainage or any signs of infection.  No significant pain on the left side today. No pain with calf compression, swelling, warmth, erythema.  Assessment: Symptomatic onychomycosis; hyperkeratotic tissue  Plan: -Treatment options including alternatives, risks, complications were discussed -Nails sharply debrided 10 without complication/bleeding. -Sharply debrided the hyperkeratotic lesions x 2 right foot any complications or bleeding. -He has seen sports medicine for left foot injury.  Symptoms improved.  No pain on exam today. -Discussed daily foot inspection. -Follow-up in 3 months or sooner if any problems are to arise. In the meantime, encouraged to call the office with any questions, concerns, changes symptoms.  Ovid Curd, DPM

## 2023-04-21 NOTE — Progress Notes (Signed)
Patient ID: Thomas Marshall, male   DOB: 1941-11-04, 82 y.o.   MRN: [id number]        Chief Complaint: follow up HTN, HLD and hyperglycemia , ckd3a, low vit d       HPI:  Thomas Marshall is a 82 y.o. male here overall doing well.  Pt denies chest pain, increased sob or doe, wheezing, orthopnea, PND, increased LE swelling, palpitations, dizziness or syncope.   Pt denies polydipsia, polyuria, or new focal neuro s/s.    Pt denies fever, wt loss, night sweats, loss of appetite, or other constitutional symptoms  Had sprained left ankle no fx with fall x 3 wks ago.   Wt Readings from Last 3 Encounters:  04/22/23 213 lb 8 oz (96.8 kg)  04/21/23 214 lb (97.1 kg)  04/02/23 215 lb (97.5 kg)   BP Readings from Last 3 Encounters:  04/22/23 123/72  04/21/23 112/64  04/02/23 (!) 148/88         Past Medical History:  Diagnosis Date   AAA (abdominal aortic aneurysm) (HCC)    Arthritis    Cancer (HCC)    skin & squamous cell   Carotid artery disease (HCC)    a. s/p R CEA 12/2015.   CKD (chronic kidney disease), stage III (HCC)    a. per historical labs.   Coronary artery disease    a. s/p CABG 2004.   Diabetes mellitus without complication (HCC)    GERD (gastroesophageal reflux disease)    Hyperlipidemia    Hypertension    Medication intolerance Multiple    PAF (paroxysmal atrial fibrillation) (HCC)    Pneumonia    Presence of permanent cardiac pacemaker    Sinus bradycardia    a. baseline HR 50s-60s, also h/o bradycardia on metoprolol and carvedilol   Past Surgical History:  Procedure Laterality Date   ANKLE SURGERY     right fused   BACK SURGERY     low back    CARDIOVERSION N/A 02/14/2020   Procedure: CARDIOVERSION;  Surgeon: Sande Rives, MD;  Location: [facility] ENDOSCOPY;  Service: Cardiovascular;  Laterality: N/A;   CARDIOVERSION N/A 02/26/2020   Procedure: CARDIOVERSION;  Surgeon: Chrystie Nose, MD;  Location: [facility] ENDOSCOPY;  Service: Cardiovascular;  Laterality: N/A;    CARDIOVERSION N/A 04/12/2020   Procedure: CARDIOVERSION;  Surgeon: Chilton Si, MD;  Location: [facility] ENDOSCOPY;  Service: Cardiovascular;  Laterality: N/A;   CHOLECYSTECTOMY N/A 12/07/2013   Procedure: LAPAROSCOPIC CHOLECYSTECTOMY ;  Surgeon: Emelia Loron, MD;  Location: [facility] OR;  Service: General;  Laterality: N/A;   COLONOSCOPY     CORONARY ARTERY BYPASS GRAFT  2004   [place]   CORONARY STENT INTERVENTION N/A 05/21/2017   Procedure: Coronary Stent Intervention;  Surgeon: Tonny Bollman, MD;  Location: [facility] INVASIVE CV LAB;  Service: Cardiovascular;  Laterality: N/A;   ENDARTERECTOMY Right 12/10/2015   Procedure: Right Carotid ENDARTERECTOMY;  Surgeon: Fransisco Hertz, MD;  Location: [facility] OR;  Service: Vascular;  Laterality: Right;   ENDARTERECTOMY Left 12/30/2021   Procedure: LEFT CAROTID ENDARTERECTOMY;  Surgeon: Chuck Hint, MD;  Location: [facility] OR;  Service: Vascular;  Laterality: Left;   EXPLORATION POST OPERATIVE OPEN HEART     FINGER SURGERY     skin graft on rt index   FINGER SURGERY Right    right index finger.   HERNIA REPAIR Right    RIH   JOINT REPLACEMENT     KNEE SURGERY     replacement on both knees  LEFT HEART CATH AND CORS/GRAFTS ANGIOGRAPHY N/A 05/21/2017   Procedure: Left Heart Cath and Cors/Grafts Angiography;  Surgeon: Tonny Bollman, MD;  Location: [facility] INVASIVE CV LAB;  Service: Cardiovascular;  Laterality: N/A;   LEFT HEART CATH AND CORS/GRAFTS ANGIOGRAPHY N/A 05/20/2020   Procedure: LEFT HEART CATH AND CORS/GRAFTS ANGIOGRAPHY;  Surgeon: Tonny Bollman, MD;  Location: [facility] INVASIVE CV LAB;  Service: Cardiovascular;  Laterality: N/A;   PACEMAKER IMPLANT N/A 03/14/2020   Procedure: PACEMAKER IMPLANT;  Surgeon: Marinus Maw, MD;  Location: [facility] INVASIVE CV LAB;  Service: Cardiovascular;  Laterality: N/A;   PROSTATE SURGERY     PVC ABLATION N/A 10/27/2017   Procedure: PVC ABLATION;  Surgeon: Marinus Maw, MD;  Location: [facility] INVASIVE CV LAB;  Service:  Cardiovascular;  Laterality: N/A;   TONSILLECTOMY     URINARY SURGERY     scar tissue    reports that he quit smoking about 52 years ago. His smoking use included cigarettes. He has a 13.00 pack-year smoking history. He has never been exposed to tobacco smoke. He has never used smokeless tobacco. He reports that he does not drink alcohol and does not use drugs. family history includes Cancer in his sister; Diabetes in his brother and sister; Heart disease in his brother, father, and mother; Hyperlipidemia in his brother and father; Hypertension in his brother, father, and sister; Stroke in his mother. Allergies  Allergen Reactions   Lisinopril Swelling and Other (See Comments)    Angioedema.    Losartan Swelling and Other (See Comments)    Lips swell Angioedema    Nifedipine Swelling and Other (See Comments)    Sugar increase   Triamterene Swelling and Other (See Comments)    Face swells, no breathing impairment   Hydralazine Hcl Other (See Comments)    Fatigue; poor appetite   Losartan Potassium-Hctz Swelling and Other (See Comments)   Amoxicillin Rash and Other (See Comments)    Did it involve swelling of the face/tongue/throat, SOB, or low BP? No Did it involve sudden or severe rash/hives, skin peeling, or any reaction on the inside of your mouth or nose? No Did you need to seek medical attention at a hospital or doctor's office? No When did it last happen?      10 + years If all above answers are "NO", may proceed with cephalosporin use.     Ampicillin Rash and Other (See Comments)    Did it involve swelling of the face/tongue/throat, SOB, or low BP? No Did it involve sudden or severe rash/hives, skin peeling, or any reaction on the inside of your mouth or nose? No Did you need to seek medical attention at a hospital or doctor's office? No When did it last happen?      10 + years If all above answers are "NO", may proceed with cephalosporin use.    Carvedilol Other (See  Comments)    Low heart rate (in higher doses)   Current Outpatient Medications on File Prior to Visit  Medication Sig Dispense Refill   ACCU-CHEK GUIDE test strip USE 1 STRIP once DAILY 100 each 3   Accu-Chek Softclix Lancets lancets USE 1 once DAILY USE AS DIRECTED E11.9 100 each 3   apixaban (ELIQUIS) 2.5 MG TABS tablet Take 1 tablet (2.5 mg total) by mouth 2 (two) times daily. (0800 & 2000) 180 tablet 3   aspirin EC 81 MG tablet Take 1 tablet (81 mg total) by mouth daily. 90 tablet 3   atorvastatin (LIPITOR)  40 MG tablet Take 40 mg by mouth at bedtime.      carvedilol (COREG) 6.25 MG tablet Take 1 tablet (6.25 mg total) by mouth 2 (two) times daily. 180 tablet 3   cholecalciferol (VITAMIN D3) 25 MCG (1000 UNIT) tablet Take 1,000 Units by mouth every evening.     hydrALAZINE (APRESOLINE) 10 MG tablet Take 10 mg by mouth 3 (three) times daily as needed (elevated bp greater than 160).     magnesium oxide (MAG-OX) 400 MG tablet Take 800 mg by mouth 2 (two) times daily.      meclizine (ANTIVERT) 25 MG tablet Take 1 tablet (25 mg total) by mouth 3 (three) times daily as needed for dizziness. 30 tablet 0   Multiple Vitamins-Minerals (MENS 50+ MULTI VITAMIN/MIN PO) Take by mouth daily at 6 (six) AM.     nitroGLYCERIN (NITROSTAT) 0.4 MG SL tablet Place 1 tablet (0.4 mg total) under the tongue every 5 (five) minutes as needed for chest pain (x 3 doses). 30 tablet 3   OMEPRAZOLE PO Take 1 capsule by mouth daily.     pantoprazole (PROTONIX) 40 MG tablet Take 1 tablet (40 mg total) by mouth daily. 90 tablet 3   terazosin (HYTRIN) 1 MG capsule Take 1 mg by mouth at bedtime.  30 capsule 11   triamcinolone ointment (KENALOG) 0.5 % Apply topically 3 (three) times daily.     vitamin C (ASCORBIC ACID) 500 MG tablet Take 500 mg by mouth daily with breakfast.      Zinc 22.5 MG TABS Take 22.5 mg by mouth 2 (two) times a week.     No current facility-administered medications on file prior to visit.        ROS:   All others reviewed and negative.  Objective        PE:  BP 112/64 (BP Location: Right Arm, Patient Position: Sitting, Cuff Size: Normal)   Pulse 60   Temp 98.1 F (36.7 C) (Oral)   Ht 6\' 2"  (1.88 m)   Wt 214 lb (97.1 kg)   SpO2 99%   BMI 27.48 kg/m                 Constitutional: Pt appears in NAD               HENT: Head: NCAT.                Right Ear: External ear normal.                 Left Ear: External ear normal.                Eyes: . Pupils are equal, round, and reactive to light. Conjunctivae and EOM are normal               Nose: without d/c or deformity               Neck: Neck supple. Gross normal ROM               Cardiovascular: Normal rate and regular rhythm.                 Pulmonary/Chest: Effort normal and breath sounds without rales or wheezing.                Abd:  Soft, NT, ND, + BS, no organomegaly               Neurological: Pt is alert. At baseline  orientation, motor grossly intact               Skin: Skin is warm. No rashes, no other new lesions, LE edema - none               Psychiatric: Pt behavior is normal without agitation   Micro: none  Cardiac tracings I have personally interpreted today:  none  Pertinent Radiological findings (summarize): none   Lab Results  Component Value Date   WBC 5.9 04/21/2023   HGB 13.6 04/21/2023   HCT 42.0 04/21/2023   PLT 122.0 (L) 04/21/2023   GLUCOSE 115 (H) 04/21/2023   CHOL 115 04/21/2023   TRIG 82.0 04/21/2023   HDL 40.10 04/21/2023   LDLDIRECT 67.0 09/16/2016   LDLCALC 58 04/21/2023   ALT 15 04/21/2023   AST 19 04/21/2023   NA 140 04/21/2023   K 5.2 (H) 04/21/2023   CL 103 04/21/2023   CREATININE 1.68 (H) 04/21/2023   BUN 31 (H) 04/21/2023   CO2 31 04/21/2023   TSH 1.72 04/21/2023   PSA 1.09 03/31/2018   INR 1.2 12/26/2021   HGBA1C 7.3 (H) 04/21/2023   MICROALBUR 4.9 (H) 04/21/2023   Assessment/Plan:  Thomas Marshall is a 82 y.o. White or Caucasian [1] male with  has a past medical history  of AAA (abdominal aortic aneurysm) (HCC), Arthritis, Cancer (HCC), Carotid artery disease (HCC), CKD (chronic kidney disease), stage III (HCC), Coronary artery disease, Diabetes mellitus without complication (HCC), GERD (gastroesophageal reflux disease), Hyperlipidemia, Hypertension, Medication intolerance (Multiple ), PAF (paroxysmal atrial fibrillation) (HCC), Pneumonia, Presence of permanent cardiac pacemaker, and Sinus bradycardia.  Type 2 diabetes mellitus with renal manifestations (HCC) Lab Results  Component Value Date   HGBA1C 7.3 (H) 04/21/2023   Stable and controlled for age with goal A1c < 7.5, pt to continue current medical treatment  - diet, wt control   Hyperlipidemia Lab Results  Component Value Date   LDLCALC 58 04/21/2023   Stable, pt to continue current statin lipitor 40 mg qd   CKD (chronic kidney disease) stage 3, GFR 30-59 ml/min (HCC) Lab Results  Component Value Date   CREATININE 1.68 (H) 04/21/2023   Stable overall, cont to avoid nephrotoxins   Essential hypertension BP Readings from Last 3 Encounters:  04/22/23 123/72  04/21/23 112/64  04/02/23 (!) 148/88   Stable, pt to continue medical treatment coreg 6.25 bid, hydrazine 10 tid   Vitamin D deficiency Last vitamin D Lab Results  Component Value Date   VD25OH 66.63 04/21/2023   Stable, cont oral replacement  Followup: Return in about 6 months (around 10/22/2023).  Oliver Barre, MD 04/24/2023 1:00 PM [facility] Medicine

## 2023-04-21 NOTE — Patient Instructions (Addendum)

## 2023-04-21 NOTE — Progress Notes (Signed)
The test results show that your current treatment is OK, as the tests are stable.  Please continue the same plan.  There is no other need for change of treatment or further evaluation based on these results, at this time.  thanks 

## 2023-04-22 ENCOUNTER — Ambulatory Visit (HOSPITAL_COMMUNITY)
Admission: RE | Admit: 2023-04-22 | Discharge: 2023-04-22 | Disposition: A | Discharge status: Home / Self Care | Payer: Medicare Other | Source: Ambulatory Visit | Attending: Vascular Surgery | Admitting: Vascular Surgery

## 2023-04-22 ENCOUNTER — Ambulatory Visit (INDEPENDENT_AMBULATORY_CARE_PROVIDER_SITE_OTHER): Payer: Medicare Other | Admitting: Physician Assistant

## 2023-04-22 VITALS — BP 123/72 | HR 78 | Temp 97.8°F | Resp 18 | Ht 74.0 in | Wt 213.5 lb

## 2023-04-22 DIAGNOSIS — Z9889 Other specified postprocedural states: Secondary | ICD-10-CM

## 2023-04-22 DIAGNOSIS — I7143 Infrarenal abdominal aortic aneurysm, without rupture: Secondary | ICD-10-CM | POA: Insufficient documentation

## 2023-04-22 NOTE — Progress Notes (Signed)
Office Note     CC:  follow up Requesting Provider:  [name], MD  HPI: Thomas Marshall is a 82 y.o. (Mar 02, 1941) male who presents with his wife today for routine follow up of AAA and carotid artery disease. He was just seen in February for follow up of his carotid artery disease. He had stable duplex with 1-39% ICA stenosis bilaterally. He has history of bilateral CEA's for asymptomatic disease. He denies any new symptoms today such as amaurosis fugax, slurred speech, facial drooping, dizziness, or unilateral upper or lower extremity weakness or numbness.  We have also been following him for a small AAA.  He has some chronic back pain. Has had prior low back surgery so he sometimes gets pain related to this. However denies any new or severe back pain. No abdominal pain.  He is on Eliquis for atrial fibrillation. He is compliant with his aspirin and statin.    Past Medical History:  Diagnosis Date   AAA (abdominal aortic aneurysm) (HCC)    Arthritis    Cancer (HCC)    skin & squamous cell   Carotid artery disease (HCC)    a. s/p R CEA 12/2015.   CKD (chronic kidney disease), stage III (HCC)    a. per historical labs.   Coronary artery disease    a. s/p CABG 2004.   Diabetes mellitus without complication (HCC)    GERD (gastroesophageal reflux disease)    Hyperlipidemia    Hypertension    Medication intolerance Multiple    PAF (paroxysmal atrial fibrillation) (HCC)    Pneumonia    Presence of permanent cardiac pacemaker    Sinus bradycardia    a. baseline HR 50s-60s, also h/o bradycardia on metoprolol and carvedilol    Past Surgical History:  Procedure Laterality Date   ANKLE SURGERY     right fused   BACK SURGERY     low back    CARDIOVERSION N/A 02/14/2020   Procedure: CARDIOVERSION;  Surgeon: Sande Rives, MD;  Location: [facility] ENDOSCOPY;  Service: Cardiovascular;  Laterality: N/A;   CARDIOVERSION N/A 02/26/2020   Procedure: CARDIOVERSION;  Surgeon: Chrystie Nose, MD;  Location: [facility] ENDOSCOPY;  Service: Cardiovascular;  Laterality: N/A;   CARDIOVERSION N/A 04/12/2020   Procedure: CARDIOVERSION;  Surgeon: Chilton Si, MD;  Location: [facility] ENDOSCOPY;  Service: Cardiovascular;  Laterality: N/A;   CHOLECYSTECTOMY N/A 12/07/2013   Procedure: LAPAROSCOPIC CHOLECYSTECTOMY ;  Surgeon: Emelia Loron, MD;  Location: [facility] OR;  Service: General;  Laterality: N/A;   COLONOSCOPY     CORONARY ARTERY BYPASS GRAFT  2004   [place]   CORONARY STENT INTERVENTION N/A 05/21/2017   Procedure: Coronary Stent Intervention;  Surgeon: Tonny Bollman, MD;  Location: [facility] INVASIVE CV LAB;  Service: Cardiovascular;  Laterality: N/A;   ENDARTERECTOMY Right 12/10/2015   Procedure: Right Carotid ENDARTERECTOMY;  Surgeon: Fransisco Hertz, MD;  Location: [facility] OR;  Service: Vascular;  Laterality: Right;   ENDARTERECTOMY Left 12/30/2021   Procedure: LEFT CAROTID ENDARTERECTOMY;  Surgeon: Chuck Hint, MD;  Location: [facility] OR;  Service: Vascular;  Laterality: Left;   EXPLORATION POST OPERATIVE OPEN HEART     FINGER SURGERY     skin graft on rt index   FINGER SURGERY Right    right index finger.   HERNIA REPAIR Right    RIH   JOINT REPLACEMENT     KNEE SURGERY     replacement on both knees   LEFT HEART CATH AND CORS/GRAFTS  ANGIOGRAPHY N/A 05/21/2017   Procedure: Left Heart Cath and Cors/Grafts Angiography;  Surgeon: Tonny Bollman, MD;  Location: [facility] INVASIVE CV LAB;  Service: Cardiovascular;  Laterality: N/A;   LEFT HEART CATH AND CORS/GRAFTS ANGIOGRAPHY N/A 05/20/2020   Procedure: LEFT HEART CATH AND CORS/GRAFTS ANGIOGRAPHY;  Surgeon: Tonny Bollman, MD;  Location: [facility] INVASIVE CV LAB;  Service: Cardiovascular;  Laterality: N/A;   PACEMAKER IMPLANT N/A 03/14/2020   Procedure: PACEMAKER IMPLANT;  Surgeon: Marinus Maw, MD;  Location: [facility] INVASIVE CV LAB;  Service: Cardiovascular;  Laterality: N/A;   PROSTATE SURGERY     PVC ABLATION N/A 10/27/2017   Procedure: PVC ABLATION;   Surgeon: Marinus Maw, MD;  Location: [facility] INVASIVE CV LAB;  Service: Cardiovascular;  Laterality: N/A;   TONSILLECTOMY     URINARY SURGERY     scar tissue    Social History   Socioeconomic History   Marital status: Married    Spouse name: Not on file   Number of children: 2   Years of education: Not on file   Highest education level: Not on file  Occupational History   Occupation: retired  Tobacco Use   Smoking status: Former    Packs/day: 1.00    Years: 13.00    Additional pack years: 0.00    Total pack years: 13.00    Types: Cigarettes    Quit date: 11/30/1970    Years since quitting: 52.4    Passive exposure: Never   Smokeless tobacco: Never  Vaping Use   Vaping Use: Never used  Substance and Sexual Activity   Alcohol use: No    Alcohol/week: 0.0 standard drinks of alcohol   Drug use: No   Sexual activity: Yes  Other Topics Concern   Not on file  Social History Narrative   Not on file   Social Determinants of Health   Financial Resource Strain: Low Risk  (08/12/2022)   Overall Financial Resource Strain (CARDIA)    Difficulty of Paying Living Expenses: Not hard at all  Food Insecurity: No Food Insecurity (09/29/2022)   Hunger Vital Sign    Worried About Running Out of Food in the Last Year: Never true    Ran Out of Food in the Last Year: Never true  Transportation Needs: No Transportation Needs (09/29/2022)   PRAPARE - Administrator, Civil Service (Medical): No    Lack of Transportation (Non-Medical): No  Physical Activity: Sufficiently Active (08/12/2022)   Exercise Vital Sign    Days of Exercise per Week: 4 days    Minutes of Exercise per Session: 40 min  Stress: No Stress Concern Present (08/12/2022)   [employer] of Occupational Health - Occupational Stress Questionnaire    Feeling of Stress : Not at all  Social Connections: Socially Integrated (08/12/2022)   Social Connection and Isolation Panel [NHANES]    Frequency of Communication  with Friends and Family: More than three times a week    Frequency of Social Gatherings with Friends and Family: More than three times a week    Attends Religious Services: More than 4 times per year    Active Member of [employer] or Organizations: Yes    Attends Banker Meetings: More than 4 times per year    Marital Status: Married  Catering manager Violence: Not At Risk (08/12/2022)   Humiliation, Afraid, Rape, and Kick questionnaire    Fear of Current or Ex-Partner: No    Emotionally Abused: No    Physically Abused:  No    Sexually Abused: No    Family History  Problem Relation Age of Onset   Stroke Mother    Heart disease Mother    Heart disease Father    Hypertension Father    Hyperlipidemia Father    Cancer Sister        breast  and skin   Diabetes Sister    Hypertension Sister    Diabetes Brother    Heart disease Brother    Hyperlipidemia Brother    Hypertension Brother    Early death Neg Hx     Current Outpatient Medications  Medication Sig Dispense Refill   ACCU-CHEK GUIDE test strip USE 1 STRIP once DAILY 100 each 3   Accu-Chek Softclix Lancets lancets USE 1 once DAILY USE AS DIRECTED E11.9 100 each 3   apixaban (ELIQUIS) 2.5 MG TABS tablet Take 1 tablet (2.5 mg total) by mouth 2 (two) times daily. (0800 & 2000) 180 tablet 3   aspirin EC 81 MG tablet Take 1 tablet (81 mg total) by mouth daily. 90 tablet 3   atorvastatin (LIPITOR) 40 MG tablet Take 40 mg by mouth at bedtime.      carvedilol (COREG) 6.25 MG tablet Take 1 tablet (6.25 mg total) by mouth 2 (two) times daily. 180 tablet 3   cholecalciferol (VITAMIN D3) 25 MCG (1000 UNIT) tablet Take 1,000 Units by mouth every evening.     hydrALAZINE (APRESOLINE) 10 MG tablet Take 10 mg by mouth 3 (three) times daily as needed (elevated bp greater than 160).     magnesium oxide (MAG-OX) 400 MG tablet Take 800 mg by mouth 2 (two) times daily.      meclizine (ANTIVERT) 25 MG tablet Take 1 tablet (25 mg total) by  mouth 3 (three) times daily as needed for dizziness. 30 tablet 0   Multiple Vitamins-Minerals (MENS 50+ MULTI VITAMIN/MIN PO) Take by mouth daily at 6 (six) AM.     nitroGLYCERIN (NITROSTAT) 0.4 MG SL tablet Place 1 tablet (0.4 mg total) under the tongue every 5 (five) minutes as needed for chest pain (x 3 doses). 30 tablet 3   OMEPRAZOLE PO Take 1 capsule by mouth daily.     pantoprazole (PROTONIX) 40 MG tablet Take 1 tablet (40 mg total) by mouth daily. 90 tablet 3   terazosin (HYTRIN) 1 MG capsule Take 1 mg by mouth at bedtime.  30 capsule 11   triamcinolone ointment (KENALOG) 0.5 % Apply topically 3 (three) times daily.     vitamin C (ASCORBIC ACID) 500 MG tablet Take 500 mg by mouth daily with breakfast.      Zinc 22.5 MG TABS Take 22.5 mg by mouth 2 (two) times a week.     No current facility-administered medications for this visit.    Allergies  Allergen Reactions   Lisinopril Swelling and Other (See Comments)    Angioedema.    Losartan Swelling and Other (See Comments)    Lips swell Angioedema    Nifedipine Swelling and Other (See Comments)    Sugar increase   Triamterene Swelling and Other (See Comments)    Face swells, no breathing impairment   Hydralazine Hcl Other (See Comments)    Fatigue; poor appetite   Losartan Potassium-Hctz Swelling and Other (See Comments)   Amoxicillin Rash and Other (See Comments)    Did it involve swelling of the face/tongue/throat, SOB, or low BP? No Did it involve sudden or severe rash/hives, skin peeling, or any reaction on the  inside of your mouth or nose? No Did you need to seek medical attention at a hospital or doctor's office? No When did it last happen?      10 + years If all above answers are "NO", may proceed with cephalosporin use.     Ampicillin Rash and Other (See Comments)    Did it involve swelling of the face/tongue/throat, SOB, or low BP? No Did it involve sudden or severe rash/hives, skin peeling, or any reaction on the  inside of your mouth or nose? No Did you need to seek medical attention at a hospital or doctor's office? No When did it last happen?      10 + years If all above answers are "NO", may proceed with cephalosporin use.    Carvedilol Other (See Comments)    Low heart rate (in higher doses)     REVIEW OF SYSTEMS:  [X]  denotes positive finding, [ ]  denotes negative finding Cardiac  Comments:  Chest pain or chest pressure:    Shortness of breath upon exertion:    Short of breath when lying flat:    Irregular heart rhythm:        Vascular    Pain in calf, thigh, or hip brought on by ambulation:    Pain in feet at night that wakes you up from your sleep:     Blood clot in your veins:    Leg swelling:         Pulmonary    Oxygen at home:    Productive cough:     Wheezing:         Neurologic    Sudden weakness in arms or legs:     Sudden numbness in arms or legs:     Sudden onset of difficulty speaking or slurred speech:    Temporary loss of vision in one eye:     Problems with dizziness:         Gastrointestinal    Blood in stool:     Vomited blood:         Genitourinary    Burning when urinating:     Blood in urine:        Psychiatric    Major depression:         Hematologic    Bleeding problems:    Problems with blood clotting too easily:        Skin    Rashes or ulcers:        Constitutional    Fever or chills:      PHYSICAL EXAMINATION:  Vitals:   04/22/23 0936  BP: 123/72  Pulse: 78  Resp: 18  Temp: 97.8 F (36.6 C)  TempSrc: Temporal  SpO2: 95%  Weight: 213 lb 8 oz (96.8 kg)  Height: 6\' 2"  (1.88 m)    General:  WDWN in NAD; vital signs documented above Gait: Normal HENT: WNL, normocephalic Pulmonary: normal non-labored breathing , without wheezing Cardiac: regular HR Abdomen: soft, NT, no masses. No palpable aortic pulse Vascular Exam/Pulses: 2+ femoral, 2+ DP and PT pulses bilaterally. Feet warm and well perfused Extremities: without  ischemic changes, without Gangrene , without cellulitis; without open wounds;  Musculoskeletal: no muscle wasting or atrophy  Neurologic: A&O X 3 Psychiatric:  The pt has Normal affect.   Non-Invasive Vascular Imaging:   Abdominal Aorta Findings:  +-----------+-------+----------+----------+--------+--------+--------+  Location  AP (cm)Trans (cm)PSV (cm/s)WaveformThrombusComments  +-----------+-------+----------+----------+--------+--------+--------+  Mid       3.43   4.05  39        biphasic                  +-----------+-------+----------+----------+--------+--------+--------+  Distal    1.83   2.12      58        biphasic                  +-----------+-------+----------+----------+--------+--------+--------+  RT CIA Prox1.3    1.6       74        biphasic                  +-----------+-------+----------+----------+--------+--------+--------+  LT CIA Prox1.1    1.5       62        biphasic                  +-----------+-------+----------+----------+--------+--------+--------+   Visualization of the Proximal Abdominal Aorta was limited.    Summary:  Abdominal Aorta: There is evidence of abnormal dilatation of the mid Abdominal aorta. The largest aortic measurement is 4.0 cm. The largest aortic diameter has increased compared to prior exam. Previous diameter measurement was 3.5 cm obtained on 04/23/21.    ASSESSMENT/PLAN:: 82 y.o. male here for follow up for AAA and carotid artery disease. He has history of bilateral CEA's. He has no  new neurological symptoms. Recent duplex follow up in February showed 1-39% ICA stenosis bilaterally. Today he returned for evaluation of his AAA. His duplex shows only slight increase in Aneurysmal diameter compared to prior study 2 years ago with maximal diameter of 4 cm. He is without any associated back or abdominal pain. -Discussed with patient and his wife that consideration for repair of AAA would be made when  the size is 5.5cm, growth > 1 cm/yr, and symptomatic status - continue Aspirin and statin - He knows should he have any shearing back or abdominal pain to seek immediate medical attention - Also reviewed signs and symptoms of TIA/ Stroke and they understand so seek immediate medical attention should these occur also - He will return in 1 year with carotid duplex and AAA duplex   Graceann Congress, PA-C [facility] [phone]  Clinic MD:   Dickson/ Myra Gianotti

## 2023-04-24 ENCOUNTER — Encounter: Payer: Self-pay | Admitting: Internal Medicine

## 2023-04-24 NOTE — Assessment & Plan Note (Signed)
BP Readings from Last 3 Encounters:  04/22/23 123/72  04/21/23 112/64  04/02/23 (!) 148/88   Stable, pt to continue medical treatment coreg 6.25 bid, hydrazine 10 tid

## 2023-04-24 NOTE — Assessment & Plan Note (Signed)
Last vitamin D Lab Results  Component Value Date   VD25OH 66.63 04/21/2023   Stable, cont oral replacement

## 2023-04-24 NOTE — Assessment & Plan Note (Signed)
Lab Results  Component Value Date   LDLCALC 58 04/21/2023   Stable, pt to continue current statin lipitor 40 mg qd

## 2023-04-24 NOTE — Assessment & Plan Note (Signed)
Lab Results  Component Value Date   CREATININE 1.68 (H) 04/21/2023   Stable overall, cont to avoid nephrotoxins

## 2023-04-24 NOTE — Assessment & Plan Note (Signed)
Lab Results  Component Value Date   HGBA1C 7.3 (H) 04/21/2023   Stable and controlled for age with goal A1c < 7.5, pt to continue current medical treatment  - diet, wt control

## 2023-05-03 ENCOUNTER — Ambulatory Visit (INDEPENDENT_AMBULATORY_CARE_PROVIDER_SITE_OTHER): Payer: Medicare Other | Admitting: Family Medicine

## 2023-05-03 ENCOUNTER — Encounter: Payer: Self-pay | Admitting: Family Medicine

## 2023-05-03 ENCOUNTER — Other Ambulatory Visit: Payer: Self-pay

## 2023-05-03 VITALS — BP 132/68 | HR 73 | Ht 74.0 in | Wt 215.4 lb

## 2023-05-03 DIAGNOSIS — M79672 Pain in left foot: Secondary | ICD-10-CM

## 2023-05-03 DIAGNOSIS — I6523 Occlusion and stenosis of bilateral carotid arteries: Secondary | ICD-10-CM

## 2023-05-03 DIAGNOSIS — I7143 Infrarenal abdominal aortic aneurysm, without rupture: Secondary | ICD-10-CM

## 2023-05-03 NOTE — Patient Instructions (Signed)
Thank you for coming in today.   Recheck as needed.   Continue home exercises.  

## 2023-05-03 NOTE — Progress Notes (Signed)
   I, Stevenson Clinch, CMA acting as a Neurosurgeon for Thomas Graham, MD.  Thomas Marshall is a 82 y.o. male who presents to [facility] today for 27-month f/u L foot pain.  Pt suffered a fall, when walking up some concrete stairs, he lost his balance, he pushed off to avoid falling on the steps, landing in the yard. Pt was last seen by Dr. Denyse Amass on 04/02/23 and was advised to use an ASO ankle brace and taught HEP. Today, pt reports improvement of sx since last visit. Not wearing ASO brace. Somewhat compliant with HEP. Denies new injury or worsening sx.   Dx imaging: 04/02/23 L foot & ankle XR  Pertinent review of systems: No fevers or chills  Relevant historical information: Vascular disease and heart disease.   Exam:  BP 132/68   Pulse 73   Ht 6\' 2"  (1.88 m)   Wt 215 lb 6.4 oz (97.7 kg)   SpO2 96%   BMI 27.66 kg/m  General: Well Developed, well nourished, and in no acute distress.   MSK: Left ankle normal-appearing nontender normal motion stable ligamentous exam.  Intact strength.    Lab and Radiology Results    EXAM: LEFT ANKLE COMPLETE - 3+ VIEW; LEFT FOOT - COMPLETE 3+ VIEW   COMPARISON:  Left foot radiographs 03/04/2016   FINDINGS: Diffuse decreased bone mineralization.   Left ankle:   The ankle mortise is symmetric and intact. There are small well corticated chronic ossicles distal to the medial and lateral malleoli. Moderate plantar and mild posterior calcaneal heel spurs. Mild dorsal talonavicular and tarsometatarsal degenerative osteophytes on lateral view.   Left foot:   Mild hallux valgus. Mild lateral great toe metatarsophalangeal joint space narrowing and peripheral osteophytosis, mildly worsened from prior.   Interval healing of the prior intra-articular fracture of the proximal medial aspect of the proximal phalanx of the great toe.   No acute fracture or dislocation.   IMPRESSION: 1. No acute fracture. 2. Mild hallux  valgus. 3. Mild great toe metatarsophalangeal joint osteoarthritis. 4. Moderate plantar and mild posterior calcaneal heel spurs.     Electronically Signed   By: Neita Garnet M.D.   On: 04/06/2023 23:24   I, Thomas Marshall, personally (independently) visualized and performed the interpretation of the images attached in this note.      Assessment and Plan: 82 y.o. male with left ankle injury thought to be ankle sprain type.  Doing well with conservative management.  Continue home exercise program and check back as needed.    Discussed warning signs or symptoms. Please see discharge instructions. Patient expresses understanding.   The above documentation has been reviewed and is accurate and complete Thomas Marshall, M.D.

## 2023-05-18 DIAGNOSIS — L821 Other seborrheic keratosis: Secondary | ICD-10-CM | POA: Diagnosis not present

## 2023-05-18 DIAGNOSIS — L570 Actinic keratosis: Secondary | ICD-10-CM | POA: Diagnosis not present

## 2023-05-18 DIAGNOSIS — L578 Other skin changes due to chronic exposure to nonionizing radiation: Secondary | ICD-10-CM | POA: Diagnosis not present

## 2023-05-18 DIAGNOSIS — B081 Molluscum contagiosum: Secondary | ICD-10-CM | POA: Diagnosis not present

## 2023-06-04 LAB — HM DIABETES EYE EXAM

## 2023-06-14 ENCOUNTER — Ambulatory Visit (INDEPENDENT_AMBULATORY_CARE_PROVIDER_SITE_OTHER): Payer: Medicare Other

## 2023-06-14 DIAGNOSIS — I495 Sick sinus syndrome: Secondary | ICD-10-CM

## 2023-06-14 LAB — CUP PACEART REMOTE DEVICE CHECK
Battery Remaining Longevity: 110 mo
Battery Remaining Percentage: 78 %
Battery Voltage: 3.02 V
Brady Statistic RV Percent Paced: 1 %
Date Time Interrogation Session: 20240715020014
Implantable Lead Connection Status: 753985
Implantable Lead Connection Status: 753985
Implantable Lead Implant Date: 20210415
Implantable Lead Implant Date: 20210415
Implantable Lead Location: 753859
Implantable Lead Location: 753860
Implantable Pulse Generator Implant Date: 20210415
Lead Channel Impedance Value: 460 Ohm
Lead Channel Pacing Threshold Amplitude: 0.5 V
Lead Channel Pacing Threshold Pulse Width: 0.5 ms
Lead Channel Sensing Intrinsic Amplitude: 8.7 mV
Lead Channel Setting Pacing Amplitude: 2.5 V
Lead Channel Setting Pacing Pulse Width: 0.5 ms
Lead Channel Setting Sensing Sensitivity: 2 mV
Pulse Gen Model: 2272
Pulse Gen Serial Number: 3813712

## 2023-06-21 DIAGNOSIS — L570 Actinic keratosis: Secondary | ICD-10-CM | POA: Diagnosis not present

## 2023-06-21 DIAGNOSIS — L821 Other seborrheic keratosis: Secondary | ICD-10-CM | POA: Diagnosis not present

## 2023-06-22 ENCOUNTER — Ambulatory Visit: Payer: Medicare Other | Admitting: Internal Medicine

## 2023-06-22 ENCOUNTER — Encounter: Payer: Self-pay | Admitting: Internal Medicine

## 2023-06-22 VITALS — BP 110/70 | HR 65 | Ht 74.0 in | Wt 215.6 lb

## 2023-06-22 DIAGNOSIS — I4819 Other persistent atrial fibrillation: Secondary | ICD-10-CM | POA: Diagnosis not present

## 2023-06-22 DIAGNOSIS — I495 Sick sinus syndrome: Secondary | ICD-10-CM | POA: Diagnosis not present

## 2023-06-22 DIAGNOSIS — I10 Essential (primary) hypertension: Secondary | ICD-10-CM | POA: Insufficient documentation

## 2023-06-22 DIAGNOSIS — Z950 Presence of cardiac pacemaker: Secondary | ICD-10-CM | POA: Diagnosis not present

## 2023-06-22 NOTE — Patient Instructions (Addendum)
Medication Instructions:  Your physician recommends that you continue on your current medications as directed. Please refer to the Current Medication list given to you today.  *If you need a refill on your cardiac medications before your next appointment, please call your pharmacy*  Lab Work: None ordered.  If you have labs (blood work) drawn today and your tests are completely normal, you will receive your results only by: MyChart Message (if you have MyChart) OR A paper copy in the mail If you have any lab test that is abnormal or we need to change your treatment, we will call you to review the results.  Testing/Procedures: None ordered.  Follow-Up: At [facility], you and your health needs are our priority.  As part of our continuing mission to provide you with exceptional heart care, we have created designated Provider Care Teams.  These Care Teams include your primary Cardiologist (physician) and Advanced Practice Providers (APPs -  Physician Assistants and Nurse Practitioners) who all work together to provide you with the care you need, when you need it.  We recommend signing up for the patient portal called "MyChart".  Sign up information is provided on this After Visit Summary.  MyChart is used to connect with patients for Virtual Visits (Telemedicine).  Patients are able to view lab/test results, encounter notes, upcoming appointments, etc.  Non-urgent messages can be sent to your provider as well.   To learn more about what you can do with MyChart, go to [email or web address].    Your next appointment:   1 year(s)  The format for your next appointment:   In Person  Provider:   Lewayne Bunting, MD{or one of the following Advanced Practice Providers on your designated Care Team:   Francis Dowse, [place] Casimiro Needle "Mardelle Matte" [place], [place] Earnest Rosier, NP  Remote monitoring is used to monitor your Pacemaker/ ICD from home. This monitoring reduces the number of office visits required  to check your device to one time per year. It allows [place] to keep an eye on the functioning of your device to ensure it is working properly. You are scheduled for a device check from home on 09/13/2023. You may send your transmission at any time that day. If you have a wireless device, the transmission will be sent automatically. After your physician reviews your transmission, you will receive a postcard with your next transmission date.  Important Information About Sugar

## 2023-06-22 NOTE — Progress Notes (Signed)
HPI  Thomas Marshall returns today after developing recurrent atrial fib despite dofetilide. He was placed on amiodarone  After presenting with atrial fib and a RVR. He then developed NSR and has had sinus bradycardia. He had his amio held and then reverted back to atrial fib with a RVR. He presents today for followup.  His rates are now fairly well controlled in atrial fib.  He states that he feels fairly well altogether. No syncope.  His wife who is with him notes that he is quite sedentary. "He wont go into the store with me."    Current Outpatient Medications  Medication Sig Dispense Refill   ACCU-CHEK GUIDE test strip USE 1 STRIP once DAILY 100 each 3   Accu-Chek Softclix Lancets lancets USE 1 once DAILY USE AS DIRECTED E11.9 100 each 3   apixaban (ELIQUIS) 2.5 MG TABS tablet Take 1 tablet (2.5 mg total) by mouth 2 (two) times daily. (0800 & 2000) 180 tablet 3   aspirin EC 81 MG tablet Take 1 tablet (81 mg total) by mouth daily. 90 tablet 3   atorvastatin (LIPITOR) 40 MG tablet Take 40 mg by mouth at bedtime.      carvedilol (COREG) 6.25 MG tablet Take 1 tablet (6.25 mg total) by mouth 2 (two) times daily. 180 tablet 3   cholecalciferol (VITAMIN D3) 25 MCG (1000 UNIT) tablet Take 1,000 Units by mouth every evening.     hydrALAZINE (APRESOLINE) 10 MG tablet Take 10 mg by mouth 3 (three) times daily as needed (elevated bp greater than 160).     magnesium oxide (MAG-OX) 400 MG tablet Take 800 mg by mouth 2 (two) times daily.      meclizine (ANTIVERT) 25 MG tablet Take 1 tablet (25 mg total) by mouth 3 (three) times daily as needed for dizziness. 30 tablet 0   Multiple Vitamins-Minerals (MENS 50+ MULTI VITAMIN/MIN PO) Take by mouth daily at 6 (six) AM.     nitroGLYCERIN (NITROSTAT) 0.4 MG SL tablet Place 1 tablet (0.4 mg total) under the tongue every 5 (five) minutes as needed for chest pain (x 3 doses). 30 tablet 3   OMEPRAZOLE PO Take 1 capsule by mouth daily.     pantoprazole (PROTONIX)  40 MG tablet Take 1 tablet (40 mg total) by mouth daily. 90 tablet 3   terazosin (HYTRIN) 1 MG capsule Take 1 mg by mouth at bedtime.  30 capsule 11   triamcinolone ointment (KENALOG) 0.5 % Apply topically 3 (three) times daily.     vitamin C (ASCORBIC ACID) 500 MG tablet Take 500 mg by mouth daily with breakfast.      Zinc 22.5 MG TABS Take 22.5 mg by mouth 2 (two) times a week.     No current facility-administered medications for this visit.     Past Medical History:  Diagnosis Date   AAA (abdominal aortic aneurysm) (HCC)    Arthritis    Cancer (HCC)    skin & squamous cell   Carotid artery disease (HCC)    a. s/p R CEA 12/2015.   CKD (chronic kidney disease), stage III (HCC)    a. per historical labs.   Coronary artery disease    a. s/p CABG 2004.   Diabetes mellitus without complication (HCC)    GERD (gastroesophageal reflux disease)    Hyperlipidemia    Hypertension    Medication intolerance Multiple    PAF (paroxysmal atrial fibrillation) (HCC)    Pneumonia  Presence of permanent cardiac pacemaker    Sinus bradycardia    a. baseline HR 50s-60s, also h/o bradycardia on metoprolol and carvedilol    ROS:   All systems reviewed and negative except as noted in the HPI.   Past Surgical History:  Procedure Laterality Date   ANKLE SURGERY     right fused   BACK SURGERY     low back    CARDIOVERSION N/A 02/14/2020   Procedure: CARDIOVERSION;  Surgeon: Sande Rives, MD;  Location: [facility] ENDOSCOPY;  Service: Cardiovascular;  Laterality: N/A;   CARDIOVERSION N/A 02/26/2020   Procedure: CARDIOVERSION;  Surgeon: Chrystie Nose, MD;  Location: [facility] ENDOSCOPY;  Service: Cardiovascular;  Laterality: N/A;   CARDIOVERSION N/A 04/12/2020   Procedure: CARDIOVERSION;  Surgeon: Chilton Si, MD;  Location: [facility] ENDOSCOPY;  Service: Cardiovascular;  Laterality: N/A;   CHOLECYSTECTOMY N/A 12/07/2013   Procedure: LAPAROSCOPIC CHOLECYSTECTOMY ;  Surgeon: Emelia Loron, MD;   Location: [facility] OR;  Service: General;  Laterality: N/A;   COLONOSCOPY     CORONARY ARTERY BYPASS GRAFT  2004   [place]   CORONARY STENT INTERVENTION N/A 05/21/2017   Procedure: Coronary Stent Intervention;  Surgeon: Tonny Bollman, MD;  Location: [facility] INVASIVE CV LAB;  Service: Cardiovascular;  Laterality: N/A;   ENDARTERECTOMY Right 12/10/2015   Procedure: Right Carotid ENDARTERECTOMY;  Surgeon: Fransisco Hertz, MD;  Location: [facility] OR;  Service: Vascular;  Laterality: Right;   ENDARTERECTOMY Left 12/30/2021   Procedure: LEFT CAROTID ENDARTERECTOMY;  Surgeon: Chuck Hint, MD;  Location: [facility] OR;  Service: Vascular;  Laterality: Left;   EXPLORATION POST OPERATIVE OPEN HEART     FINGER SURGERY     skin graft on rt index   FINGER SURGERY Right    right index finger.   HERNIA REPAIR Right    RIH   JOINT REPLACEMENT     KNEE SURGERY     replacement on both knees   LEFT HEART CATH AND CORS/GRAFTS ANGIOGRAPHY N/A 05/21/2017   Procedure: Left Heart Cath and Cors/Grafts Angiography;  Surgeon: Tonny Bollman, MD;  Location: [facility] INVASIVE CV LAB;  Service: Cardiovascular;  Laterality: N/A;   LEFT HEART CATH AND CORS/GRAFTS ANGIOGRAPHY N/A 05/20/2020   Procedure: LEFT HEART CATH AND CORS/GRAFTS ANGIOGRAPHY;  Surgeon: Tonny Bollman, MD;  Location: [facility] INVASIVE CV LAB;  Service: Cardiovascular;  Laterality: N/A;   PACEMAKER IMPLANT N/A 03/14/2020   Procedure: PACEMAKER IMPLANT;  Surgeon: Marinus Maw, MD;  Location: [facility] INVASIVE CV LAB;  Service: Cardiovascular;  Laterality: N/A;   PROSTATE SURGERY     PVC ABLATION N/A 10/27/2017   Procedure: PVC ABLATION;  Surgeon: Marinus Maw, MD;  Location: [facility] INVASIVE CV LAB;  Service: Cardiovascular;  Laterality: N/A;   TONSILLECTOMY     URINARY SURGERY     scar tissue     Family History  Problem Relation Age of Onset   Stroke Mother    Heart disease Mother    Heart disease Father    Hypertension Father    Hyperlipidemia Father    Cancer Sister         breast  and skin   Diabetes Sister    Hypertension Sister    Diabetes Brother    Heart disease Brother    Hyperlipidemia Brother    Hypertension Brother    Early death Neg Hx      Social History   Socioeconomic History   Marital status: Married    Spouse name: Not on file  Number of children: 2   Years of education: Not on file   Highest education level: Not on file  Occupational History   Occupation: retired  Tobacco Use   Smoking status: Former    Current packs/day: 0.00    Average packs/day: 1 pack/day for 13.0 years (13.0 ttl pk-yrs)    Types: Cigarettes    Start date: 11/30/1957    Quit date: 11/30/1970    Years since quitting: 52.5    Passive exposure: Never   Smokeless tobacco: Never  Vaping Use   Vaping status: Never Used  Substance and Sexual Activity   Alcohol use: No    Alcohol/week: 0.0 standard drinks of alcohol   Drug use: No   Sexual activity: Yes  Other Topics Concern   Not on file  Social History Narrative   Not on file   Social Determinants of Health   Financial Resource Strain: Low Risk  (08/12/2022)   Overall Financial Resource Strain (CARDIA)    Difficulty of Paying Living Expenses: Not hard at all  Food Insecurity: No Food Insecurity (09/29/2022)   Hunger Vital Sign    Worried About Running Out of Food in the Last Year: Never true    Ran Out of Food in the Last Year: Never true  Transportation Needs: No Transportation Needs (09/29/2022)   PRAPARE - Administrator, Civil Service (Medical): No    Lack of Transportation (Non-Medical): No  Physical Activity: Sufficiently Active (08/12/2022)   Exercise Vital Sign    Days of Exercise per Week: 4 days    Minutes of Exercise per Session: 40 min  Stress: No Stress Concern Present (08/12/2022)   [employer] of Occupational Health - Occupational Stress Questionnaire    Feeling of Stress : Not at all  Social Connections: Socially Integrated (08/12/2022)   Social Connection and  Isolation Panel [NHANES]    Frequency of Communication with Friends and Family: More than three times a week    Frequency of Social Gatherings with Friends and Family: More than three times a week    Attends Religious Services: More than 4 times per year    Active Member of [employer] or Organizations: Yes    Attends Engineer, structural: More than 4 times per year    Marital Status: Married  Catering manager Violence: Not At Risk (08/12/2022)   Humiliation, Afraid, Rape, and Kick questionnaire    Fear of Current or Ex-Partner: No    Emotionally Abused: No    Physically Abused: No    Sexually Abused: No     BP 110/70   Pulse 65   Ht 6\' 2"  (1.88 m)   Wt 215 lb 9.6 oz (97.8 kg)   SpO2 99%   BMI 27.68 kg/m   Physical Exam:  Well appearing 82 yo man, NAD HEENT: Unremarkable Neck:  No JVD, no thyromegally Lymphatics:  No adenopathy Back:  No CVA tenderness Lungs:  Clear with no wheezes HEART:  IRegular rate rhythm, no murmurs, no rubs, no clicks Abd:  soft, positive bowel sounds, no organomegally, no rebound, no guarding Ext:  2 plus pulses, no edema, no cyanosis, no clubbing Skin:  No rashes no nodules Neuro:  CN II through XII intact, motor grossly intact  DEVICE  Normal device function.  See PaceArt for details.   Assess/Plan:  1. Atrial fib - his VR is well controlled. He will continue his current meds. 2. Coags - he has not had bleeding on eliquis. 3. HTN -  his bp is controlled. 4. PVC's - he is s/p ablation and doing well. 5. PPM - his [employer] PPM is programmed VVIR. We will follow.   Sharlot Gowda Kieron Kantner,MD

## 2023-06-25 NOTE — Progress Notes (Signed)
Remote pacemaker transmission.   

## 2023-07-15 ENCOUNTER — Emergency Department (HOSPITAL_COMMUNITY)
Admission: EM | Admit: 2023-07-15 | Discharge: 2023-07-15 | Disposition: A | Discharge status: Home / Self Care | Payer: Medicare Other | Attending: Emergency Medicine | Admitting: Emergency Medicine

## 2023-07-15 ENCOUNTER — Other Ambulatory Visit: Payer: Self-pay

## 2023-07-15 ENCOUNTER — Emergency Department (HOSPITAL_COMMUNITY): Payer: Medicare Other

## 2023-07-15 ENCOUNTER — Encounter (HOSPITAL_COMMUNITY): Payer: Self-pay

## 2023-07-15 DIAGNOSIS — M5459 Other low back pain: Secondary | ICD-10-CM | POA: Diagnosis not present

## 2023-07-15 DIAGNOSIS — R109 Unspecified abdominal pain: Secondary | ICD-10-CM | POA: Insufficient documentation

## 2023-07-15 DIAGNOSIS — M545 Low back pain, unspecified: Secondary | ICD-10-CM | POA: Insufficient documentation

## 2023-07-15 DIAGNOSIS — Z7982 Long term (current) use of aspirin: Secondary | ICD-10-CM | POA: Diagnosis not present

## 2023-07-15 DIAGNOSIS — N189 Chronic kidney disease, unspecified: Secondary | ICD-10-CM | POA: Diagnosis not present

## 2023-07-15 DIAGNOSIS — N200 Calculus of kidney: Secondary | ICD-10-CM | POA: Diagnosis not present

## 2023-07-15 DIAGNOSIS — Z7901 Long term (current) use of anticoagulants: Secondary | ICD-10-CM | POA: Insufficient documentation

## 2023-07-15 DIAGNOSIS — I7143 Infrarenal abdominal aortic aneurysm, without rupture: Secondary | ICD-10-CM | POA: Diagnosis not present

## 2023-07-15 DIAGNOSIS — K5730 Diverticulosis of large intestine without perforation or abscess without bleeding: Secondary | ICD-10-CM | POA: Diagnosis not present

## 2023-07-15 DIAGNOSIS — I4891 Unspecified atrial fibrillation: Secondary | ICD-10-CM | POA: Diagnosis not present

## 2023-07-15 LAB — CBC WITH DIFFERENTIAL/PLATELET
Abs Immature Granulocytes: 0.01 10*3/uL (ref 0.00–0.07)
Basophils Absolute: 0.1 10*3/uL (ref 0.0–0.1)
Basophils Relative: 1 %
Eosinophils Absolute: 0.4 10*3/uL (ref 0.0–0.5)
Eosinophils Relative: 6 %
HCT: 41.4 % (ref 39.0–52.0)
Hemoglobin: 13.3 g/dL (ref 13.0–17.0)
Immature Granulocytes: 0 %
Lymphocytes Relative: 22 %
Lymphs Abs: 1.2 10*3/uL (ref 0.7–4.0)
MCH: 32.8 pg (ref 26.0–34.0)
MCHC: 32.1 g/dL (ref 30.0–36.0)
MCV: 102 fL — ABNORMAL HIGH (ref 80.0–100.0)
Monocytes Absolute: 0.5 10*3/uL (ref 0.1–1.0)
Monocytes Relative: 10 %
Neutro Abs: 3.4 10*3/uL (ref 1.7–7.7)
Neutrophils Relative %: 61 %
Platelets: 105 10*3/uL — ABNORMAL LOW (ref 150–400)
RBC: 4.06 MIL/uL — ABNORMAL LOW (ref 4.22–5.81)
RDW: 12.5 % (ref 11.5–15.5)
WBC: 5.5 10*3/uL (ref 4.0–10.5)
nRBC: 0 % (ref 0.0–0.2)

## 2023-07-15 LAB — BASIC METABOLIC PANEL
Anion gap: 10 (ref 5–15)
BUN: 30 mg/dL — ABNORMAL HIGH (ref 8–23)
CO2: 27 mmol/L (ref 22–32)
Calcium: 8.4 mg/dL — ABNORMAL LOW (ref 8.9–10.3)
Chloride: 99 mmol/L (ref 98–111)
Creatinine, Ser: 1.93 mg/dL — ABNORMAL HIGH (ref 0.61–1.24)
GFR, Estimated: 34 mL/min — ABNORMAL LOW (ref >60)
Glucose, Bld: 141 mg/dL — ABNORMAL HIGH (ref 70–99)
Potassium: 4.9 mmol/L (ref 3.5–5.1)
Sodium: 136 mmol/L (ref 135–145)

## 2023-07-15 LAB — URINALYSIS, ROUTINE W REFLEX MICROSCOPIC
Bilirubin Urine: NEGATIVE
Glucose, UA: NEGATIVE mg/dL
Hgb urine dipstick: NEGATIVE
Ketones, ur: 15 mg/dL — AB
Leukocytes,Ua: NEGATIVE
Nitrite: NEGATIVE
Protein, ur: 30 mg/dL — AB
Specific Gravity, Urine: 1.025 (ref 1.005–1.030)
pH: 6 (ref 5.0–8.0)

## 2023-07-15 LAB — URINALYSIS, MICROSCOPIC (REFLEX)

## 2023-07-15 NOTE — ED Triage Notes (Signed)
Pt presents with a 3 day hx of R lower back pain without radiation. Pt reports associated decrease in urinary stream. Pt denies injury. Hx of nephrolithiasis.

## 2023-07-15 NOTE — Discharge Instructions (Signed)
You were seen in the emergency department for some right-sided low back pain.  You had blood work urinalysis and a CAT scan of your abdomen that did not show an obvious explanation for your symptoms.  You have a known aneurysm that we will need to be followed.  You had some stones up in the kidney but none in the tube going down to the bladder where they would typically cause pain.  You can use ice or heat to the affected area and Tylenol for pain.  Follow-up with your regular doctor.  Return to the emergency department if any worsening or concerning symptoms

## 2023-07-15 NOTE — ED Provider Notes (Signed)
[place] EMERGENCY DEPARTMENT AT [facility] Provider Note   CSN: [id number] Arrival date & time: 07/15/23  1204     History  Chief Complaint  Patient presents with   Back Pain    Thomas Marshall is a 82 y.o. male.  He has a history of renal disease, infrarenal AAA, A-fib on anticoagulation.  He is complaining of 3 days of low back pain on the right.  Worse with movement.  Rates it as 2 out of 10 at rest.  Does not radiate in abdomen or down his legs.  Not associated with any fevers nausea vomiting.  No urinary symptoms no hematuria.  He has had kidney stones in the past and he says it feels a little bit similar.  The history is provided by the patient.  Back Pain Location:  Lumbar spine Quality:  Aching Pain severity:  Moderate Onset quality:  Gradual Duration:  3 days Timing:  Constant Progression:  Unchanged Chronicity:  Recurrent Context: not recent injury   Relieved by:  None tried Worsened by:  Bending and twisting Ineffective treatments:  None tried Associated symptoms: no abdominal pain, no bladder incontinence, no bowel incontinence, no chest pain, no dysuria, no fever, no leg pain, no numbness and no weakness   Risk factors: vascular disease        Home Medications Prior to Admission medications   Medication Sig Start Date End Date Taking? Authorizing Provider  ACCU-CHEK GUIDE test strip USE 1 STRIP once DAILY 11/02/22   [name], MD  Accu-Chek Softclix Lancets lancets USE 1 once DAILY USE AS DIRECTED E11.9 11/02/22   [name], MD  apixaban (ELIQUIS) 2.5 MG TABS tablet Take 1 tablet (2.5 mg total) by mouth 2 (two) times daily. (0800 & 2000) 08/03/19   Azalee Course, PA  aspirin EC 81 MG tablet Take 1 tablet (81 mg total) by mouth daily. 07/27/19   Bhagat, Sharrell Ku, PA  atorvastatin (LIPITOR) 40 MG tablet Take 40 mg by mouth at bedtime.     [provider]  carvedilol (COREG) 6.25 MG tablet Take 1 tablet (6.25 mg total) by mouth 2 (two)  times daily. 10/15/21   Dyann Kief, PA-C  cholecalciferol (VITAMIN D3) 25 MCG (1000 UNIT) tablet Take 1,000 Units by mouth every evening.    [provider]  hydrALAZINE (APRESOLINE) 10 MG tablet Take 10 mg by mouth 3 (three) times daily as needed (elevated bp greater than 160).    [provider]  magnesium oxide (MAG-OX) 400 MG tablet Take 800 mg by mouth 2 (two) times daily.     [provider]  meclizine (ANTIVERT) 25 MG tablet Take 1 tablet (25 mg total) by mouth 3 (three) times daily as needed for dizziness. 02/11/21   Olive Bass, FNP  Multiple Vitamins-Minerals (MENS 50+ MULTI VITAMIN/MIN PO) Take by mouth daily at 6 (six) AM.    [provider]  nitroGLYCERIN (NITROSTAT) 0.4 MG SL tablet Place 1 tablet (0.4 mg total) under the tongue every 5 (five) minutes as needed for chest pain (x 3 doses). 05/22/16   Erasmo Downer, MD  OMEPRAZOLE PO Take 1 capsule by mouth daily. 11/17/10   [provider]  pantoprazole (PROTONIX) 40 MG tablet Take 1 tablet (40 mg total) by mouth daily. 06/08/17 12/24/23  Manson Passey, PA  terazosin (HYTRIN) 1 MG capsule Take 1 mg by mouth at bedtime.  01/07/17   Tonny Bollman, MD  triamcinolone ointment (KENALOG) 0.5 %  Apply topically 3 (three) times daily. 11/17/22   [provider]  vitamin C (ASCORBIC ACID) 500 MG tablet Take 500 mg by mouth daily with breakfast.     [provider]  Zinc 22.5 MG TABS Take 22.5 mg by mouth 2 (two) times a week.    [provider]      Allergies    Lisinopril, Losartan, Nifedipine, Triamterene, Hydralazine hcl, Losartan potassium-hctz, Amoxicillin, Ampicillin, and Carvedilol    Review of Systems   Review of Systems  Constitutional:  Negative for fever.  Respiratory:  Negative for shortness of breath.   Cardiovascular:  Negative for chest pain.  Gastrointestinal:  Negative for abdominal pain and bowel incontinence.  Genitourinary:   Negative for bladder incontinence and dysuria.  Musculoskeletal:  Positive for back pain.  Neurological:  Negative for weakness and numbness.    Physical Exam Updated Vital Signs BP 114/63 (BP Location: Right Arm)   Pulse (!) 50   Temp (!) 97.5 F (36.4 C) (Oral)   Resp 19   Ht 6\' 2"  (1.88 m)   Wt 99.8 kg   SpO2 99%   BMI 28.25 kg/m  Physical Exam Vitals and nursing note reviewed.  Constitutional:      General: He is not in acute distress.    Appearance: Normal appearance. He is well-developed.  HENT:     Head: Normocephalic and atraumatic.  Eyes:     Conjunctiva/sclera: Conjunctivae normal.  Cardiovascular:     Rate and Rhythm: Normal rate and regular rhythm.     Heart sounds: No murmur heard. Pulmonary:     Effort: Pulmonary effort is normal. No respiratory distress.     Breath sounds: Normal breath sounds.  Abdominal:     Palpations: Abdomen is soft. There is no mass.     Tenderness: There is no abdominal tenderness. There is no guarding or rebound.  Musculoskeletal:        General: No deformity. Normal range of motion.     Cervical back: Neck supple.     Comments: He has no midline spine tenderness.  He has a little bit of tenderness right paralumbar.  Skin:    General: Skin is warm and dry.     Capillary Refill: Capillary refill takes less than 2 seconds.  Neurological:     General: No focal deficit present.     Mental Status: He is alert.     ED Results / Procedures / Treatments   Labs (all labs ordered are listed, but only abnormal results are displayed) Labs Reviewed  URINALYSIS, ROUTINE W REFLEX MICROSCOPIC - Abnormal; Notable for the following components:      Result Value   Ketones, ur 15 (*)    Protein, ur 30 (*)    All other components within normal limits  URINALYSIS, MICROSCOPIC (REFLEX) - Abnormal; Notable for the following components:   Bacteria, UA RARE (*)    All other components within normal limits  BASIC METABOLIC PANEL - Abnormal;  Notable for the following components:   Glucose, Bld 141 (*)    BUN 30 (*)    Creatinine, Ser 1.93 (*)    Calcium 8.4 (*)    GFR, Estimated 34 (*)    All other components within normal limits  CBC WITH DIFFERENTIAL/PLATELET - Abnormal; Notable for the following components:   RBC 4.06 (*)    MCV 102.0 (*)    Platelets 105 (*)    All other components within normal limits    EKG  None  Radiology CT Renal Stone Study  Result Date: 07/15/2023 CLINICAL DATA:  Abdominal/flank pain. Concern for kidney stone on the right. EXAM: CT ABDOMEN AND PELVIS WITHOUT CONTRAST TECHNIQUE: Multidetector CT imaging of the abdomen and pelvis was performed following the standard protocol without IV contrast. RADIATION DOSE REDUCTION: This exam was performed according to the departmental dose-optimization program which includes automated exposure control, adjustment of the mA and/or kV according to patient size and/or use of iterative reconstruction technique. COMPARISON:  CT abdomen pelvis dated 08/06/2016. FINDINGS: Evaluation of this exam is limited in the absence of intravenous contrast. Lower chest: Areas of cystic changes involving the posterior right lung base. The visualized lung bases are otherwise clear. There is coronary vascular calcification. Cardiac pacemaker wires noted. No intra-abdominal free air or free fluid. Hepatobiliary: The liver is unremarkable. No biliary dilatation. Cholecystectomy. No retained calcified stone noted in the central CBD. Pancreas: Unremarkable. No pancreatic ductal dilatation or surrounding inflammatory changes. Spleen: Normal in size without focal abnormality. Adrenals/Urinary Tract: The adrenal glands are unremarkable. Mild bilateral renal parenchyma atrophy. Punctate nonobstructing bilateral upper pole calculi. No hydronephrosis or obstructing stone. The visualized ureters and urinary bladder appear unremarkable. Stomach/Bowel: There is sigmoid diverticulosis without active  inflammatory changes. There is no bowel obstruction or active inflammation. The appendix is normal. Vascular/Lymphatic: Advanced aortoiliac atherosclerotic disease. There is a 3.8 cm fusiform infrarenal abdominal aortic aneurysm, previously measuring approximately 3.2 cm. The IVC is unremarkable. No portal venous gas. There is no adenopathy. Reproductive: Insert and seminal vesicles are grossly unremarkable. No pelvic mass. Other: None Musculoskeletal: Osteopenia with degenerative changes. Old left posterior rib fractures. No acute osseous pathology. IMPRESSION: 1. Punctate nonobstructing bilateral upper pole renal calculi. No hydronephrosis or obstructing stone. 2. Sigmoid diverticulosis. No bowel obstruction. Normal appendix. 3. A 3.8 cm fusiform infrarenal abdominal aortic aneurysm. Recommend follow-up ultrasound every 2 years. This recommendation follows ACR consensus guidelines: White Paper of the ACR Incidental Findings Committee II on Vascular Findings. J Am Coll Radiol 2013; 40:981-191. 4.  Aortic Atherosclerosis (ICD10-I70.0). Electronically Signed   By: Elgie Collard M.D.   On: 07/15/2023 18:18    Procedures Procedures    Medications Ordered in ED Medications - No data to display  ED Course/ Medical Decision Making/ A&P Clinical Course as of 07/16/23 1232  Thu Jul 15, 2023  1858 Results of CT imaging and lab work with patient.  His symptoms are likely muscular in nature.  He said he does not want a prescription for any pain medicine.  Will follow-up with PCP.  We discussed return instructions. [MB]    Clinical Course User Index [MB] Terrilee Files, MD                                 Medical Decision Making Amount and/or Complexity of Data Reviewed Labs: ordered. Radiology: ordered.   This patient complains of right-sided low back pain; this involves an extensive number of treatment Options and is a complaint that carries with it a high risk of complications and morbidity.  The differential includes musculoskeletal pain, radiculopathy, aneurysm rupture, renal colic, pyelonephritis  I ordered, reviewed and interpreted labs, which included CBC with normal white count normal hemoglobin, platelets chronically low, chemistries with slightly worsening CKD, urinalysis without hematuria or signs of infection I ordered imaging studies which included CT renal and I independently    visualized and interpreted imaging which showed no acute findings to  explain patient's pain.  Patient's aneurysm slightly enlarged from prior.  Has some renal stones but no signs of hydro Additional history obtained from patient's family members Previous records obtained and reviewed in epic no recent admissions Cardiac monitoring reviewed, sinus rhythm Social determinants considered, no significant barriers Critical Interventions: None  After the interventions stated above, I reevaluated the patient and found patient to be neurovascularly intact in no distress Admission and further testing considered, no indications for admission or further workup at this time.  Patient is declining any pain medication.  Recommended symptomatic treatment and close follow-up with PCP.  Return instructions discussed\        Final Clinical Impression(s) / ED Diagnoses Final diagnoses:  Acute right-sided low back pain without sciatica    Rx / DC Orders ED Discharge Orders     None         Terrilee Files, MD 07/16/23 1235

## 2023-07-16 ENCOUNTER — Ambulatory Visit: Payer: Medicare Other | Admitting: Podiatry

## 2023-07-16 DIAGNOSIS — L84 Corns and callosities: Secondary | ICD-10-CM

## 2023-07-16 DIAGNOSIS — Z7901 Long term (current) use of anticoagulants: Secondary | ICD-10-CM | POA: Diagnosis not present

## 2023-07-16 DIAGNOSIS — B351 Tinea unguium: Secondary | ICD-10-CM

## 2023-07-16 DIAGNOSIS — M79675 Pain in left toe(s): Secondary | ICD-10-CM | POA: Diagnosis not present

## 2023-07-16 DIAGNOSIS — M79674 Pain in right toe(s): Secondary | ICD-10-CM

## 2023-07-17 NOTE — Progress Notes (Signed)
Patient ID: NESTER WIENCEK, male   DOB: 1941-07-25, 82 y.o.   MRN: [id number]  Subjective: No chief complaint on file.    82 y.o. returns the office today for painful, elongated, thickened toenails which he cannot trim himself and for calluses on the right foot.  No open lesions. No injuries or new concerns to his feet today.   He is on Eliquis  Last A1c was 7.3 on 04/21/2023  [name], MD Last seen 04/21/2023   Objective: AAO 3, NAD DP/PT pulses palpable, CRT less than 3 seconds Protective sensation decreased with Dorann Ou monofilament Nails hypertrophic, dystrophic, elongated, brittle, discolored 10. There is tenderness overlying the nails 1-5 bilaterally. There is no surrounding erythema or drainage along the nail sites. There is hyperkeratotic lesion right fifth metatarsal base and fifth metatarsal head. No ulceration, drainage or any signs of infection.  No pain with calf compression, swelling, warmth, erythema.  Assessment: Symptomatic onychomycosis; hyperkeratotic tissue  Plan: -Treatment options including alternatives, risks, complications were discussed -Nails sharply debrided 10 without complication/bleeding. -Sharply debrided the hyperkeratotic lesions x 2 right foot any complications or bleeding. -Discussed daily foot inspection. -Follow-up in 3 months or sooner if any problems are to arise. In the meantime, encouraged to call the office with any questions, concerns, changes symptoms.  Ovid Curd, DPM

## 2023-07-21 ENCOUNTER — Telehealth: Payer: Self-pay

## 2023-07-21 NOTE — Telephone Encounter (Signed)
Transition Care Management Unsuccessful Follow-up Telephone Call  Date of discharge and from where:  [facility] 8/15  Attempts:  1st Attempt  Reason for unsuccessful TCM follow-up call:  No answer/busy   Lenard Forth [place]  [facility], [facility] Guide, Phone: [phone] Website: [email or web address]

## 2023-07-22 ENCOUNTER — Telehealth: Payer: Self-pay

## 2023-07-22 NOTE — Telephone Encounter (Signed)
Transition Care Management Unsuccessful Follow-up Telephone Call  Date of discharge and from where:  [facility] 8/[facility]  Attempts:  2nd Attempt  Reason for unsuccessful TCM follow-up call:  No answer/busy   Lenard Forth   [facility], [facility] Guide, Phone: [phone] Website: [email or web address]

## 2023-08-05 DIAGNOSIS — I129 Hypertensive chronic kidney disease with stage 1 through stage 4 chronic kidney disease, or unspecified chronic kidney disease: Secondary | ICD-10-CM | POA: Diagnosis not present

## 2023-08-05 DIAGNOSIS — I4811 Longstanding persistent atrial fibrillation: Secondary | ICD-10-CM | POA: Diagnosis not present

## 2023-08-05 DIAGNOSIS — R809 Proteinuria, unspecified: Secondary | ICD-10-CM | POA: Diagnosis not present

## 2023-08-05 DIAGNOSIS — D631 Anemia in chronic kidney disease: Secondary | ICD-10-CM | POA: Diagnosis not present

## 2023-08-05 DIAGNOSIS — N1832 Chronic kidney disease, stage 3b: Secondary | ICD-10-CM | POA: Diagnosis not present

## 2023-08-05 DIAGNOSIS — D696 Thrombocytopenia, unspecified: Secondary | ICD-10-CM | POA: Diagnosis not present

## 2023-08-05 DIAGNOSIS — N189 Chronic kidney disease, unspecified: Secondary | ICD-10-CM | POA: Diagnosis not present

## 2023-08-05 DIAGNOSIS — N2581 Secondary hyperparathyroidism of renal origin: Secondary | ICD-10-CM | POA: Diagnosis not present

## 2023-08-05 DIAGNOSIS — E1122 Type 2 diabetes mellitus with diabetic chronic kidney disease: Secondary | ICD-10-CM | POA: Diagnosis not present

## 2023-08-18 ENCOUNTER — Ambulatory Visit: Payer: Medicare Other | Admitting: Nurse Practitioner

## 2023-08-18 NOTE — Progress Notes (Unsigned)
Cardiology Office Note    Patient Name: Thomas Marshall Date of Encounter: 08/18/2023  Primary Care Provider:  [name], MD Primary Cardiologist:  Tonny Bollman, MD Primary Electrophysiologist: Lewayne Bunting, MD   Past Medical History    Past Medical History:  Diagnosis Date   AAA (abdominal aortic aneurysm) (HCC)    Arthritis    Cancer (HCC)    skin & squamous cell   Carotid artery disease (HCC)    a. s/p R CEA 12/2015.   CKD (chronic kidney disease), stage III (HCC)    a. per historical labs.   Coronary artery disease    a. s/p CABG 2004.   Diabetes mellitus without complication (HCC)    GERD (gastroesophageal reflux disease)    Hyperlipidemia    Hypertension    Medication intolerance Multiple    PAF (paroxysmal atrial fibrillation) (HCC)    Pneumonia    Presence of permanent cardiac pacemaker    Sinus bradycardia    a. baseline HR 50s-60s, also h/o bradycardia on metoprolol and carvedilol    History of Present Illness  Thomas Marshall is a 82 y.o. male with a PMH of CAD s/p CABG x 4 in 2004 with PCI/DES to [facility] 2018, PAF (on Eliquis), carotid artery disease s/p right endarterectomy and left carotid enterectomy 12/2021, DM type II, HTN, HLP, CKD, AAA, PVC ablation 09/2017, sinus node dysfunction s/p [facility] PPM/2021 who presents today for complaint of fatigue and shortness of breath.  Mr. Debaere has a significant vascular and cardiac history as noted above.  He was last seen by Jari Favre, PA for follow-up on 12/22/2022.  During visit patient reported no shortness of breath or chest pain.  He noted being in atrial fibrillation all the time and staying fatigued all the time.  He was hypertensive initially and BP reduced to 140/80 on recheck.  2D echo was updated and showed EF of 55 to 60% with no RWMA and mild LVH, mildly dilated LA/mildly dilated RA, trivial MVR with mild AS.  He was advised to continue as needed hydralazine and to monitor BP.  He was seen most recently by  Dr. Ladona Ridgel on 06/22/2023 and was in controlled AF and endorsed no syncope but following a sedentary lifestyle.  He had no medication changes or device changes made at that time.  During today's visit the patient reports that he has been experiencing increased fatigue along with decreased energy over the past 3 to 4 weeks.  His wife reports that he is wiped out with very little activity.  His blood pressure today was controlled at 104/64 and heart rate was elevated at 106 bpm.  He reports at home his blood pressure has been running in the 70s and 80s with occasional outlier of 90 bpm.  He denies any chest pain and has noticed a poor appetite but is drinking Glucerna shakes.  He also has been drinking less water and was encouraged to increase p.o. intake.  He has been compliant with his current medications and denies any additional adverse reactions.  During today's visit our device nurse came over and completed an interrogation of his device which showed no episodes of ventricular tachycardia or sustained arrhythmias.  Patient denies chest pain, palpitations, dyspnea, PND, orthopnea, nausea, vomiting, dizziness, syncope, edema, weight gain, or early satiety.    Review of Systems  Please see the history of present illness.    All other systems reviewed and are otherwise negative except as noted above.  Physical Exam    Wt Readings from Last 3 Encounters:  07/15/23 220 lb (99.8 kg)  06/22/23 215 lb 9.6 oz (97.8 kg)  05/03/23 215 lb 6.4 oz (97.7 kg)   MV:HQION were no vitals filed for this visit.,There is no height or weight on file to calculate BMI. GEN: Well nourished, well developed in no acute distress Neck: No JVD; No carotid bruits Pulmonary: Clear to auscultation without rales, wheezing or rhonchi  Cardiovascular: Normal rate. Regular rhythm. Normal S1. Normal S2.   Murmurs: There is no murmur.  ABDOMEN: Soft, non-tender, non-distended EXTREMITIES:  No edema; No deformity   EKG/LABS/  Recent Cardiac Studies   ECG personally reviewed by me today -atrial fibrillation with controlled rate of 86 bpm and RBBB with left anterior fascicular block with no acute changes noted consistent with previous EKG.  Risk Assessment/Calculations:    CHA2DS2-VASc Score = 4   This indicates a 4.8% annual risk of stroke. The patient's score is based upon: CHF History: 0 HTN History: 1 Diabetes History: 0 Stroke History: 0 Vascular Disease History: 1 Age Score: 2 Gender Score: 0         Lab Results  Component Value Date   WBC 5.5 07/15/2023   HGB 13.3 07/15/2023   HCT 41.4 07/15/2023   MCV 102.0 (H) 07/15/2023   PLT 105 (L) 07/15/2023   Lab Results  Component Value Date   CREATININE 1.93 (H) 07/15/2023   BUN 30 (H) 07/15/2023   NA 136 07/15/2023   K 4.9 07/15/2023   CL 99 07/15/2023   CO2 27 07/15/2023   Lab Results  Component Value Date   CHOL 115 04/21/2023   HDL 40.10 04/21/2023   LDLCALC 58 04/21/2023   LDLDIRECT 67.0 09/16/2016   TRIG 82.0 04/21/2023   CHOLHDL 3 04/21/2023    Lab Results  Component Value Date   HGBA1C 7.3 (H) 04/21/2023   Assessment & Plan    1.  Coronary artery disease: -s/p CABG x 4 in 2004 with most recent ischemic evaluation completed by [facility] in 2021 with continued patency of bypass grafts and moderately severe stenosis distal anastomosis of saphenous vein graft to OM -Today patient reports no chest pain but has endorsed ongoing shortness of breath and fatigue with minimal activity. -We will decrease his Coreg to 3.125 mg once a day to see if fatigue improves. -Continue current GDMT with ASA 81 mg, Lipitor 40 mg daily,.  Nitrostat 0.4 mg daily  2.  HFpEF: -2D echo completed 12/2022 showing EF of 55 to 60% with no RWMA and mild LVH, mildly dilated LA/mildly dilated RA, trivial MVR with mild AS.  -Today patient reports increased fatigue and shortness of breath. -He is euvolemic on examination today -Continue carvedilol 3.125 mg once  daily, hydralazine 10 mg 3 times daily  3.  Permanent AF: -Rates are currently controlled with rate of 86 bpm -Continue Eliquis 2.5 mg twice daily -CHA2DS2-VASc Score = 4 [CHF History: 0, HTN History: 1, Diabetes History: 0, Stroke History: 0, Vascular Disease History: 1, Age Score: 2, Gender Score: 0].  Therefore, the patient's annual risk of stroke is 4.8 %.      4.  Essential hypertension: -Patient's blood pressure was well-controlled at 104/64 -Continue carvedilol 3.125 mg once daily, hydralazine 10 mg 3 times daily  5.  History of PVCs: -s/p PVC ablation 09/2017 -Paceart report shows no evidence of ventricular arrhythmia since previous  6.  PPM/sinus node dysfunction: -s/p [employer] PPM -Most recent Paceart  report showing normal device function and programmed optimized Breath. Disposition: Follow-up with Tonny Bollman, MD or APP in January    Signed, Napoleon Form, Leodis Rains, NP 08/18/2023, 5:31 PM [facility]

## 2023-08-19 ENCOUNTER — Encounter: Payer: Self-pay | Admitting: Nurse Practitioner

## 2023-08-19 ENCOUNTER — Telehealth: Payer: Self-pay

## 2023-08-19 ENCOUNTER — Ambulatory Visit: Payer: Medicare Other | Attending: Nurse Practitioner | Admitting: Nurse Practitioner

## 2023-08-19 VITALS — BP 104/64 | HR 106 | Ht 74.0 in | Wt 207.0 lb

## 2023-08-19 DIAGNOSIS — I10 Essential (primary) hypertension: Secondary | ICD-10-CM | POA: Insufficient documentation

## 2023-08-19 DIAGNOSIS — I495 Sick sinus syndrome: Secondary | ICD-10-CM | POA: Insufficient documentation

## 2023-08-19 DIAGNOSIS — I493 Ventricular premature depolarization: Secondary | ICD-10-CM | POA: Insufficient documentation

## 2023-08-19 DIAGNOSIS — I4821 Permanent atrial fibrillation: Secondary | ICD-10-CM | POA: Diagnosis not present

## 2023-08-19 DIAGNOSIS — I2510 Atherosclerotic heart disease of native coronary artery without angina pectoris: Secondary | ICD-10-CM | POA: Insufficient documentation

## 2023-08-19 DIAGNOSIS — Z950 Presence of cardiac pacemaker: Secondary | ICD-10-CM | POA: Diagnosis not present

## 2023-08-19 MED ORDER — CARVEDILOL 6.25 MG PO TABS: 3.1250 mg | ORAL_TABLET | Freq: Two times a day (BID) | ORAL | 6 refills | Status: DC

## 2023-08-19 MED ORDER — CARVEDILOL 3.125 MG PO TABS: 3.1250 mg | ORAL_TABLET | Freq: Every day | ORAL | 6 refills | Status: AC

## 2023-08-19 NOTE — Telephone Encounter (Signed)
Robin Searing NP request interrogation of patient's device during his appointment with gen cards today.  Patient reports experiencing SOB at rest and on exertion as well as increased fatigue and intermittent chest discomfort. He has history of persistent AF with RVR; however, his rates and symptoms have been under control.  Patient reports symptoms began in past 2-3 weeks.   Device interrogation performed today. Normal device function.  Dual chamber pacemaker programmed VVI 40 due to persistent AF.  I do not have burden or episode history of atrial events.  His presenting does show irregular VS R-R rhythm 50-145 bpm.  There are no ventricular episodes. Histogram does not reflect evidence of frequent sustained fast ventricular rates.  EKG today did confirm AF.  Also noted QRS of which appears to be patient's baseline in comparison to last EKG.  NP reviewed device interrogation report and EKG.  (See Robin Searing, NP encounter note today for details).  Forwarding device interrogation details to Dr. Ladona Ridgel as Lorain Childes.   SEE PACEART for full device interrogation details.

## 2023-08-19 NOTE — Patient Instructions (Addendum)
Medication Instructions:  TAKE CARVEDILOL 3.125 MG DAILY (1/2 TAB) *If you need a refill on your cardiac medications before your next appointment, please call your pharmacy*  Lab Work: NONE  Testing/Procedures: NONE  Follow-Up: At [facility], you and your health needs are our priority.  As part of our continuing mission to provide you with exceptional heart care, we have created designated Provider Care Teams.  These Care Teams include your primary Cardiologist (physician) and Advanced Practice Providers (APPs -  Physician Assistants and Nurse Practitioners) who all work together to provide you with the care you need, when you need it.  Your next appointment:   KEEP SCHEDULED APPOINTMENT   Provider:   Tonny Bollman, MD

## 2023-08-20 NOTE — Telephone Encounter (Signed)
Nothing to do with the device.

## 2023-08-24 ENCOUNTER — Ambulatory Visit: Payer: Medicare Other

## 2023-08-24 ENCOUNTER — Telehealth: Payer: Self-pay | Admitting: Nurse Practitioner

## 2023-08-24 NOTE — Telephone Encounter (Signed)
Pt c/o medication issue:  1. Name of Medication: Carvedilol  2. How are you currently taking this medication (dosage and times per day)? 1 time a day  3. Are you having a reaction (difficulty breathing--STAT)?   4. What is your medication issue? He says it is not making him feel any better

## 2023-08-24 NOTE — Telephone Encounter (Signed)
Spoke with patient and he feels coreg is not helping. He states he still feels the same since last OV 9/19. He says his symptoms are not worse but he they are not getting better. He would like to know what are his other options.

## 2023-08-25 NOTE — Telephone Encounter (Signed)
Reviewed past notes and recent testing including his echocardiogram from earlier this year.  The patient has had prominent complaints of fatigue and shortness of breath for many years with extensive testing including cardiac catheterization performed.  His echo test is reassuring from earlier this year.  He has permanent atrial fibrillation now and has failed multiple antiarrhythmic drugs.  I really do not think we have anything from a cardiac perspective to offer him in terms of improving his fatigue.  I think reducing his beta-blocker was a good idea and would probably keep him at his current dose.  I would not recommend any further testing at this point.

## 2023-08-26 NOTE — Telephone Encounter (Signed)
Left detailed message on VM (DPR).

## 2023-08-30 ENCOUNTER — Ambulatory Visit (INDEPENDENT_AMBULATORY_CARE_PROVIDER_SITE_OTHER): Payer: Medicare Other

## 2023-08-30 VITALS — Ht 74.0 in | Wt 207.0 lb

## 2023-08-30 DIAGNOSIS — Z0000 Encounter for general adult medical examination without abnormal findings: Secondary | ICD-10-CM

## 2023-08-30 NOTE — Patient Instructions (Signed)
Thomas Marshall , Thank you for taking time to come for your Medicare Wellness Visit. I appreciate your ongoing commitment to your health goals. Please review the following plan we discussed and let me know if I can assist you in the future.   Referrals/Orders/Follow-Ups/Clinician Recommendations: You are due for a Flu vaccine and also a Shingles vaccine.  If you decide to get the Shingles vaccine, you can get this at your pharmacy.  It was nice talking to you today.  Each day, aim for 6 glasses of water, plenty of protein in your diet and try to get up and walk/ stretch every hour for 5-10 minutes at a time.    This is a list of the screening recommended for you and due dates:  Health Maintenance  Topic Date Due   Zoster (Shingles) Vaccine (1 of 2) Never done   Flu Shot  07/01/2023   COVID-19 Vaccine (4 - 2023-24 season) 08/01/2023   Eye exam for diabetics  10/22/2023*   DTaP/Tdap/Td vaccine (2 - Td or Tdap) 04/03/2024   Yearly kidney health urinalysis for diabetes  04/20/2024   Complete foot exam   04/20/2024   Yearly kidney function blood test for diabetes  07/14/2024   Medicare Annual Wellness Visit  08/29/2024   Pneumonia Vaccine  Completed   HPV Vaccine  Aged Out  *Topic was postponed. The date shown is not the original due date.    Advanced directives: (Copy Requested) Please bring a copy of your health care power of attorney and living will to the office to be added to your chart at your convenience.  Next Medicare Annual Wellness Visit scheduled for next year: Yes  d

## 2023-08-30 NOTE — Progress Notes (Signed)
Subjective:   Thomas Marshall is a 82 y.o. male who presents for Medicare Annual/Subsequent preventive examination.  Visit Complete: Virtual  I connected with  Damarian L Otoole on 08/30/23 by a audio enabled telemedicine application and verified that I am speaking with the correct person using two identifiers.  Patient Location: Home  Provider Location: Office/Clinic  I discussed the limitations of evaluation and management by telemedicine. The patient expressed understanding and agreed to proceed.  Because this visit was a virtual/telehealth visit, some criteria may be missing or patient reported. Any vitals not documented were not able to be obtained and vitals that have been documented are patient reported.    Cardiac Risk Factors include: advanced age (>48men, >30 women);male gender;Other (see comment);hypertension;diabetes mellitus, Risk factor comments: CAD, PVD, AAA, A-Fib, CKD,Aortic atherosclerosis     Objective:    Today's Vitals   08/30/23 1025  Weight: 207 lb (93.9 kg)  Height: 6\' 2"  (1.88 m)   Body mass index is 26.58 kg/m.     08/30/2023   10:39 AM 07/15/2023   12:52 PM 08/12/2022    4:37 PM 12/26/2021   11:04 AM 05/20/2020    8:01 AM 04/11/2020    2:44 PM 03/14/2020    7:44 AM  Advanced Directives  Does Patient Have a Medical Advance Directive? Yes Yes Yes Yes Yes Yes Yes  Type of Estate agent of [place];Living will Healthcare Power of [place];Living will Healthcare Power of [place];Living will Healthcare Power of [place];Living will Healthcare Power of [place];Living will Healthcare Power of [place];Living will Healthcare Power of [place];Living will  Does patient want to make changes to medical advance directive?     No - Patient declined  No - Patient declined  Copy of Healthcare Power of Attorney in Chart? No - copy requested  No - copy requested No - copy requested No - copy requested Yes - validated most recent copy scanned in chart  (See row information) Yes - validated most recent copy scanned in chart (See row information)    Current Medications (verified) Outpatient Encounter Medications as of 08/30/2023  Medication Sig   ACCU-CHEK GUIDE test strip USE 1 STRIP once DAILY   Accu-Chek Softclix Lancets lancets USE 1 once DAILY USE AS DIRECTED E11.9   apixaban (ELIQUIS) 2.5 MG TABS tablet Take 1 tablet (2.5 mg total) by mouth 2 (two) times daily. (0800 & 2000)   aspirin EC 81 MG tablet Take 1 tablet (81 mg total) by mouth daily.   atorvastatin (LIPITOR) 40 MG tablet Take 40 mg by mouth at bedtime.    carvedilol (COREG) 3.125 MG tablet Take 1 tablet (3.125 mg total) by mouth daily at 12 noon.   cholecalciferol (VITAMIN D3) 25 MCG (1000 UNIT) tablet Take 1,000 Units by mouth every evening.   hydrALAZINE (APRESOLINE) 10 MG tablet Take 10 mg by mouth 3 (three) times daily as needed (elevated bp greater than 160).   magnesium oxide (MAG-OX) 400 MG tablet Take 800 mg by mouth 2 (two) times daily.    meclizine (ANTIVERT) 25 MG tablet Take 1 tablet (25 mg total) by mouth 3 (three) times daily as needed for dizziness.   Multiple Vitamins-Minerals (MENS 50+ MULTI VITAMIN/MIN PO) Take by mouth daily at 6 (six) AM.   nitroGLYCERIN (NITROSTAT) 0.4 MG SL tablet Place 1 tablet (0.4 mg total) under the tongue every 5 (five) minutes as needed for chest pain (x 3 doses).   OMEPRAZOLE PO Take 1 capsule by mouth daily.  pantoprazole (PROTONIX) 40 MG tablet Take 1 tablet (40 mg total) by mouth daily.   terazosin (HYTRIN) 1 MG capsule Take 1 mg by mouth at bedtime.    vitamin C (ASCORBIC ACID) 500 MG tablet Take 500 mg by mouth daily with breakfast.    Zinc 22.5 MG TABS Take 22.5 mg by mouth 2 (two) times a week.   No facility-administered encounter medications on file as of 08/30/2023.    Allergies (verified) Lisinopril, Losartan, Nifedipine, Triamterene, Hydralazine hcl, Losartan potassium-hctz, Amoxicillin, Ampicillin, and Carvedilol    History: Past Medical History:  Diagnosis Date   AAA (abdominal aortic aneurysm) (HCC)    Arthritis    Cancer (HCC)    skin & squamous cell   Carotid artery disease (HCC)    a. s/p R CEA 12/2015.   CKD (chronic kidney disease), stage III (HCC)    a. per historical labs.   Coronary artery disease    a. s/p CABG 2004.   Diabetes mellitus without complication (HCC)    GERD (gastroesophageal reflux disease)    Hyperlipidemia    Hypertension    Medication intolerance Multiple    PAF (paroxysmal atrial fibrillation) (HCC)    Pneumonia    Presence of permanent cardiac pacemaker    Sinus bradycardia    a. baseline HR 50s-60s, also h/o bradycardia on metoprolol and carvedilol   Past Surgical History:  Procedure Laterality Date   ANKLE SURGERY     right fused   BACK SURGERY     low back    CARDIOVERSION N/A 02/14/2020   Procedure: CARDIOVERSION;  Surgeon: Sande Rives, MD;  Location: [facility] ENDOSCOPY;  Service: Cardiovascular;  Laterality: N/A;   CARDIOVERSION N/A 02/26/2020   Procedure: CARDIOVERSION;  Surgeon: Chrystie Nose, MD;  Location[facility] ENDOSCOPY;  Service: Cardiovascular;  Laterality: N/A;   CARDIOVERSION N/A 04/12/2020   Procedure: CARDIOVERSION;  Surgeon: Chilton Si, MD;  Location: [facility] ENDOSCOPY;  Service: Cardiovascular;  Laterality: N/A;   CHOLECYSTECTOMY N/A 12/07/2013   Procedure: LAPAROSCOPIC CHOLECYSTECTOMY ;  Surgeon: Emelia Loron, MD;  Location: [facility] OR;  Service: General;  Laterality: N/A;   COLONOSCOPY     CORONARY ARTERY BYPASS GRAFT  2004   [place]   CORONARY STENT INTERVENTION N/A 05/21/2017   Procedure: Coronary Stent Intervention;  Surgeon: Tonny Bollman, MD;  Location: [facility] INVASIVE CV LAB;  Service: Cardiovascular;  Laterality: N/A;   ENDARTERECTOMY Right 12/10/2015   Procedure: Right Carotid ENDARTERECTOMY;  Surgeon: Fransisco Hertz, MD;  Location: [facility] OR;  Service: Vascular;  Laterality: Right;   ENDARTERECTOMY Left 12/30/2021   Procedure:  LEFT CAROTID ENDARTERECTOMY;  Surgeon: Chuck Hint, MD;  Location: [facility] OR;  Service: Vascular;  Laterality: Left;   EXPLORATION POST OPERATIVE OPEN HEART     FINGER SURGERY     skin graft on rt index   FINGER SURGERY Right    right index finger.   HERNIA REPAIR Right    RIH   JOINT REPLACEMENT     KNEE SURGERY     replacement on both knees   LEFT HEART CATH AND CORS/GRAFTS ANGIOGRAPHY N/A 05/21/2017   Procedure: Left Heart Cath and Cors/Grafts Angiography;  Surgeon: Tonny Bollman, MD;  Location: [facility] INVASIVE CV LAB;  Service: Cardiovascular;  Laterality: N/A;   LEFT HEART CATH AND CORS/GRAFTS ANGIOGRAPHY N/A 05/20/2020   Procedure: LEFT HEART CATH AND CORS/GRAFTS ANGIOGRAPHY;  Surgeon: Tonny Bollman, MD;  Location: [facility] INVASIVE CV LAB;  Service: Cardiovascular;  Laterality: N/A;   PACEMAKER  IMPLANT N/A 03/14/2020   Procedure: PACEMAKER IMPLANT;  Surgeon: Marinus Maw, MD;  Location: [facility] INVASIVE CV LAB;  Service: Cardiovascular;  Laterality: N/A;   PROSTATE SURGERY     PVC ABLATION N/A 10/27/2017   Procedure: PVC ABLATION;  Surgeon: Marinus Maw, MD;  Location: [facility] INVASIVE CV LAB;  Service: Cardiovascular;  Laterality: N/A;   TONSILLECTOMY     URINARY SURGERY     scar tissue   Family History  Problem Relation Age of Onset   Stroke Mother    Heart disease Mother    Heart disease Father    Hypertension Father    Hyperlipidemia Father    Cancer Sister        breast  and skin   Diabetes Sister    Hypertension Sister    Diabetes Brother    Heart disease Brother    Hyperlipidemia Brother    Hypertension Brother    Early death Neg Hx    Social History   Socioeconomic History   Marital status: Married    Spouse name: Claris Che   Number of children: 2   Years of education: Not on file   Highest education level: Not on file  Occupational History   Occupation: retired  Tobacco Use   Smoking status: Former    Current packs/day: 0.00    Average packs/day: 1  pack/day for 13.0 years (13.0 ttl pk-yrs)    Types: Cigarettes    Start date: 11/30/1957    Quit date: 11/30/1970    Years since quitting: 52.7    Passive exposure: Never   Smokeless tobacco: Never  Vaping Use   Vaping status: Never Used  Substance and Sexual Activity   Alcohol use: No    Alcohol/week: 0.0 standard drinks of alcohol   Drug use: No   Sexual activity: Yes  Other Topics Concern   Not on file  Social History Narrative   Has 3 dogs.  Lives with wife.   Social Determinants of Health   Financial Resource Strain: Low Risk  (08/30/2023)   Overall Financial Resource Strain (CARDIA)    Difficulty of Paying Living Expenses: Not hard at all  Food Insecurity: No Food Insecurity (08/30/2023)   Hunger Vital Sign    Worried About Running Out of Food in the Last Year: Never true    Ran Out of Food in the Last Year: Never true  Transportation Needs: No Transportation Needs (08/30/2023)   PRAPARE - Administrator, Civil Service (Medical): No    Lack of Transportation (Non-Medical): No  Physical Activity: Inactive (08/30/2023)   Exercise Vital Sign    Days of Exercise per Week: 0 days    Minutes of Exercise per Session: 0 min  Stress: No Stress Concern Present (08/30/2023)   [employer] of Occupational Health - Occupational Stress Questionnaire    Feeling of Stress : Not at all  Social Connections: Moderately Isolated (08/30/2023)   Social Connection and Isolation Panel [NHANES]    Frequency of Communication with Friends and Family: More than three times a week    Frequency of Social Gatherings with Friends and Family: More than three times a week    Attends Religious Services: Never    Database administrator or Organizations: No    Attends Banker Meetings: Never    Marital Status: Married    Tobacco Counseling Counseling given: Not Answered   Clinical Intake:  Pre-visit preparation completed: Yes  Pain : No/denies pain  BMI - recorded:  26.58 Nutritional Status: BMI 25 -29 Overweight Nutritional Risks: None Diabetes: Yes CBG done?: No Did pt. bring in CBG monitor from home?: No  How often do you need to have someone help you when you read instructions, pamphlets, or other written materials from your doctor or pharmacy?: 1 - Never  Interpreter Needed?: No  Information entered by :: Alasia Enge, RMA   Activities of Daily Living    08/30/2023   10:30 AM  In your present state of health, do you have any difficulty performing the following activities:  Hearing? 1  Comment Wears hearing aides  Vision? 0  Difficulty concentrating or making decisions? 0  Walking or climbing stairs? 0  Dressing or bathing? 0  Doing errands, shopping? 0  Preparing Food and eating ? N  Using the Toilet? N  In the past six months, have you accidently leaked urine? N  Do you have problems with loss of bowel control? N  Managing your Medications? N  Managing your Finances? N  Housekeeping or managing your Housekeeping? N    Patient Care Team: [name], MD as PCP - General (Internal Medicine) Marinus Maw, MD as PCP - Electrophysiology (Cardiology) Tonny Bollman, MD as PCP - Cardiology (Cardiology) Tonny Bollman, MD as Consulting Physician (Cardiology) Emelia Loron, MD as Consulting Physician (General Surgery) Judi Saa, DO as Consulting Physician (Family Medicine) Helane Gunther, DPM as Consulting Physician (Podiatry) Vivi Barrack, DPM as Consulting Physician (Podiatry)  Indicate any recent Medical Services you may have received from other than [facility] providers in the past year (date may be approximate).     Assessment:   This is a routine wellness examination for [place].  Hearing/Vision screen Hearing Screening - Comments:: Wears hearing aides Vision Screening - Comments:: Wears eyeglasses.   Goals Addressed             This Visit's Progress    Patient Stated   On track    Stay as  healthy and as independent as possible. Love family and enjoy life.      Depression Screen    08/30/2023   10:45 AM 04/21/2023    1:06 PM 11/26/2022    3:12 PM 10/14/2022    1:30 PM 08/12/2022    4:36 PM 04/16/2021    2:31 PM 04/16/2021    1:55 PM  PHQ 2/9 Scores  PHQ - 2 Score 0 0 0 0 0 0 0  PHQ- 9 Score 3  0 1       Fall Risk    08/30/2023   10:40 AM 04/21/2023    1:06 PM 11/26/2022    3:12 PM 10/14/2022    1:30 PM 08/12/2022    4:37 PM  Fall Risk   Falls in the past year? 0 1 0 0 0  Number falls in past yr: 0 0 0  0  Injury with Fall? 0 0 0  0  Risk for fall due to : No Fall Risks History of fall(s) No Fall Risks No Fall Risks No Fall Risks  Follow up Falls prevention discussed;Falls evaluation completed Falls evaluation completed Falls evaluation completed Falls evaluation completed Falls prevention discussed    MEDICARE RISK AT HOME: Medicare Risk at Home Any stairs in or around the home?: Yes If so, are there any without handrails?: Yes Home free of loose throw rugs in walkways, pet beds, electrical cords, etc?: Yes Adequate lighting in your home to reduce risk of falls?: Yes Life  alert?: No Use of a cane, walker or w/c?: No Grab bars in the bathroom?: Yes Shower chair or bench in shower?: Yes Elevated toilet seat or a handicapped toilet?: Yes  TIMED UP AND GO:  Was the test performed?  No    Cognitive Function:        08/30/2023   10:41 AM 08/12/2022    4:41 PM  6CIT Screen  What Year? 0 points 0 points  What month? 0 points 0 points  What time? 0 points 0 points  Count back from 20 0 points 0 points  Months in reverse 0 points 0 points  Repeat phrase 0 points 0 points  Total Score 0 points 0 points    Immunizations Immunization History  Administered Date(s) Administered   Fluad Quad(high Dose 65+) 07/26/2019, 08/30/2020, 08/09/2021, 08/21/2022   Influenza Split 10/13/2011   Influenza, High Dose Seasonal PF 08/17/2013, 09/08/2013, 07/31/2014,  08/31/2015, 07/31/2016, 07/22/2017, 08/30/2017, 08/04/2018   Influenza,inj,Quad PF,6+ Mos 08/20/2014, 08/13/2015, 07/31/2016   Influenza-Unspecified 09/29/2004, 09/23/2005, 11/30/2006, 07/31/2009, 08/22/2009, 08/23/2010, 08/31/2011, 10/13/2011, 10/28/2011, 08/08/2012, 08/31/2019, 07/31/2020, 11/30/2020, 07/31/2021   PFIZER(Purple Top)SARS-COV-2 Vaccination 12/20/2019, 01/08/2020, 11/26/2020   Pneumococcal Conjugate-13 09/10/2014   Pneumococcal Polysaccharide-23 10/30/2006, 05/22/2016   Pneumococcal-Unspecified 12/06/2003, 11/11/2006, 10/30/2008   Tdap 04/03/2014    TDAP status: Up to date  Flu Vaccine status: Due, Education has been provided regarding the importance of this vaccine. Advised may receive this vaccine at local pharmacy or Health Dept. Aware to provide a copy of the vaccination record if obtained from local pharmacy or Health Dept. Verbalized acceptance and understanding.  Pneumococcal vaccine status: Up to date  Covid-19 vaccine status: Declined, Education has been provided regarding the importance of this vaccine but patient still declined. Advised may receive this vaccine at local pharmacy or Health Dept.or vaccine clinic. Aware to provide a copy of the vaccination record if obtained from local pharmacy or Health Dept. Verbalized acceptance and understanding.  Qualifies for Shingles Vaccine? Yes   Zostavax completed No   Shingrix Completed?: No.    Education has been provided regarding the importance of this vaccine. Patient has been advised to call insurance company to determine out of pocket expense if they have not yet received this vaccine. Advised may also receive vaccine at local pharmacy or Health Dept. Verbalized acceptance and understanding.  Screening Tests Health Maintenance  Topic Date Due   Zoster Vaccines- Shingrix (1 of 2) Never done   INFLUENZA VACCINE  07/01/2023   COVID-19 Vaccine (4 - 2023-24 season) 08/01/2023   OPHTHALMOLOGY EXAM  10/22/2023  (Originally 02/05/2023)   DTaP/Tdap/Td (2 - Td or Tdap) 04/03/2024   Diabetic kidney evaluation - Urine ACR  04/20/2024   FOOT EXAM  04/20/2024   Diabetic kidney evaluation - eGFR measurement  07/14/2024   Medicare Annual Wellness (AWV)  08/29/2024   Pneumonia Vaccine 50+ Years old  Completed   HPV VACCINES  Aged Out    Health Maintenance  Health Maintenance Due  Topic Date Due   Zoster Vaccines- Shingrix (1 of 2) Never done   INFLUENZA VACCINE  07/01/2023   COVID-19 Vaccine (4 - 2023-24 season) 08/01/2023    Colorectal cancer screening: No longer required.   Lung Cancer Screening: (Low Dose CT Chest recommended if Age 7-80 years, 20 pack-year currently smoking OR have quit w/in 15years.) does not qualify.   Lung Cancer Screening Referral: N/A  Additional Screening:  Hepatitis C Screening: does not qualify;  Vision Screening: Recommended annual ophthalmology exams for early detection of  glaucoma and other disorders of the eye. Is the patient up to date with their annual eye exam?  Yes  Who is the provider or what is the name of the office in which the patient attends annual eye exams? [facility] If pt is not established with a provider, would they like to be referred to a provider to establish care? No .   Dental Screening: Recommended annual dental exams for proper oral hygiene  Diabetic Foot Exam: Diabetic Foot Exam: Completed 04/16/2023  Community Resource Referral / Chronic Care Management: CRR required this visit?  No   CCM required this visit?  No     Plan:     I have personally reviewed and noted the following in the patient's chart:   Medical and social history Use of alcohol, tobacco or illicit drugs  Current medications and supplements including opioid prescriptions. Patient is not currently taking opioid prescriptions. Functional ability and status Nutritional status Physical activity Advanced directives List of other physicians Hospitalizations,  surgeries, and ER visits in previous 12 months Vitals Screenings to include cognitive, depression, and falls Referrals and appointments  In addition, I have reviewed and discussed with patient certain preventive protocols, quality metrics, and best practice recommendations. A written personalized care plan for preventive services as well as general preventive health recommendations were provided to patient.     Beni Turrell L Renee Beale, CMA   08/30/2023   After Visit Summary: (MyChart) Due to this being a telephonic visit, the after visit summary with patients personalized plan was offered to patient via MyChart   Nurse Notes: Patient is due a Flu and Shingrix vaccine.  Patient declines any more Covid vaccines.  He also declines the Shingrix vaccine.  [facility] stated [facility] and has had an examine this year. I have sent off letter for eye exam records.  Patient had no other concerns to address today.

## 2023-08-31 ENCOUNTER — Ambulatory Visit (INDEPENDENT_AMBULATORY_CARE_PROVIDER_SITE_OTHER): Payer: Medicare Other

## 2023-08-31 DIAGNOSIS — Z23 Encounter for immunization: Secondary | ICD-10-CM | POA: Diagnosis not present

## 2023-08-31 NOTE — Progress Notes (Signed)
Pt requested high dose flu vaccine. Administered vaccine. Pt tolerated well.

## 2023-09-03 ENCOUNTER — Ambulatory Visit (INDEPENDENT_AMBULATORY_CARE_PROVIDER_SITE_OTHER): Payer: Medicare Other

## 2023-09-03 ENCOUNTER — Encounter: Payer: Self-pay | Admitting: Internal Medicine

## 2023-09-03 ENCOUNTER — Ambulatory Visit (INDEPENDENT_AMBULATORY_CARE_PROVIDER_SITE_OTHER): Payer: Medicare Other | Admitting: Internal Medicine

## 2023-09-03 VITALS — BP 120/76 | HR 81 | Temp 98.0°F | Ht 74.0 in | Wt 205.0 lb

## 2023-09-03 DIAGNOSIS — E1122 Type 2 diabetes mellitus with diabetic chronic kidney disease: Secondary | ICD-10-CM

## 2023-09-03 DIAGNOSIS — R0600 Dyspnea, unspecified: Secondary | ICD-10-CM | POA: Diagnosis not present

## 2023-09-03 DIAGNOSIS — N1832 Chronic kidney disease, stage 3b: Secondary | ICD-10-CM

## 2023-09-03 DIAGNOSIS — E7800 Pure hypercholesterolemia, unspecified: Secondary | ICD-10-CM | POA: Diagnosis not present

## 2023-09-03 DIAGNOSIS — J90 Pleural effusion, not elsewhere classified: Secondary | ICD-10-CM | POA: Diagnosis not present

## 2023-09-03 DIAGNOSIS — J9811 Atelectasis: Secondary | ICD-10-CM | POA: Diagnosis not present

## 2023-09-03 DIAGNOSIS — R5383 Other fatigue: Secondary | ICD-10-CM | POA: Diagnosis not present

## 2023-09-03 DIAGNOSIS — E559 Vitamin D deficiency, unspecified: Secondary | ICD-10-CM

## 2023-09-03 DIAGNOSIS — J309 Allergic rhinitis, unspecified: Secondary | ICD-10-CM

## 2023-09-03 DIAGNOSIS — I10 Essential (primary) hypertension: Secondary | ICD-10-CM | POA: Diagnosis not present

## 2023-09-03 DIAGNOSIS — R0602 Shortness of breath: Secondary | ICD-10-CM | POA: Diagnosis not present

## 2023-09-03 DIAGNOSIS — S0501XA Injury of conjunctiva and corneal abrasion without foreign body, right eye, initial encounter: Secondary | ICD-10-CM | POA: Insufficient documentation

## 2023-09-03 LAB — POCT GLYCOSYLATED HEMOGLOBIN (HGB A1C): Hemoglobin A1C: 7.1 % — AB (ref 4.0–5.6)

## 2023-09-03 NOTE — Patient Instructions (Signed)
Your A1c was done today  Please continue all other medications as before  Please have the pharmacy call with any other refills you may need.  Please continue your efforts at being more active, low cholesterol diet, and weight control.  Please keep your appointments with your specialists as you may have planned  Please go to the XRAY Department in the first floor for the x-ray testing  You will be contacted by phone if any changes need to be made immediately.  Otherwise, you will receive a letter about your results with an explanation, but please check with MyChart first.  Please make an Appointment to return in 6 months, or sooner if needed

## 2023-09-03 NOTE — Progress Notes (Unsigned)
Patient ID: Thomas Marshall, male   DOB: 1941-05-24, 82 y.o.   MRN: [id number]        Chief Complaint: follow up dyspnea, dm, hld, htn, low vit d       HPI:  Thomas Marshall is a 82 y.o. male here with c/o dyspnea but Pt denies chest pain, increased doe, wheezing, orthopnea, PND, increased LE swelling, palpitations, dizziness or syncope.   Pt denies polydipsia, polyuria, or new focal neuro s/s.    Pt denies fever, wt loss, night sweats, loss of appetite, or other constitutional symptoms  Does have several wks ongoing nasal allergy symptoms with clearish congestion, itch and sneezing, without fever, pain, ST, cough, swelling or wheezing.        Wt Readings from Last 3 Encounters:  09/03/23 205 lb (93 kg)  08/30/23 207 lb (93.9 kg)  08/19/23 207 lb (93.9 kg)   BP Readings from Last 3 Encounters:  09/03/23 120/76  08/19/23 104/64  07/15/23 126/81         Past Medical History:  Diagnosis Date   AAA (abdominal aortic aneurysm) (HCC)    Arthritis    Cancer (HCC)    skin & squamous cell   Carotid artery disease (HCC)    a. s/p R CEA 12/2015.   CKD (chronic kidney disease), stage III (HCC)    a. per historical labs.   Coronary artery disease    a. s/p CABG 2004.   Diabetes mellitus without complication (HCC)    GERD (gastroesophageal reflux disease)    Hyperlipidemia    Hypertension    Medication intolerance Multiple    PAF (paroxysmal atrial fibrillation) (HCC)    Pneumonia    Presence of permanent cardiac pacemaker    Sinus bradycardia    a. baseline HR 50s-60s, also h/o bradycardia on metoprolol and carvedilol   Past Surgical History:  Procedure Laterality Date   ANKLE SURGERY     right fused   BACK SURGERY     low back    CARDIOVERSION N/A 02/14/2020   Procedure: CARDIOVERSION;  Surgeon: Sande Rives, MD;  Location: [facility] ENDOSCOPY;  Service: Cardiovascular;  Laterality: N/A;   CARDIOVERSION N/A 02/26/2020   Procedure: CARDIOVERSION;  Surgeon: Chrystie Nose, MD;   Location: [facility] ENDOSCOPY;  Service: Cardiovascular;  Laterality: N/A;   CARDIOVERSION N/A 04/12/2020   Procedure: CARDIOVERSION;  Surgeon: Chilton Si, MD;  Location: [facility] ENDOSCOPY;  Service: Cardiovascular;  Laterality: N/A;   CHOLECYSTECTOMY N/A 12/07/2013   Procedure: LAPAROSCOPIC CHOLECYSTECTOMY ;  Surgeon: Emelia Loron, MD;  Location: [facility] OR;  Service: General;  Laterality: N/A;   COLONOSCOPY     CORONARY ARTERY BYPASS GRAFT  2004   [place]   CORONARY STENT INTERVENTION N/A 05/21/2017   Procedure: Coronary Stent Intervention;  Surgeon: Tonny Bollman, MD;  Location: [facility] INVASIVE CV LAB;  Service: Cardiovascular;  Laterality: N/A;   ENDARTERECTOMY Right 12/10/2015   Procedure: Right Carotid ENDARTERECTOMY;  Surgeon: Fransisco Hertz, MD;  Location: [facility] OR;  Service: Vascular;  Laterality: Right;   ENDARTERECTOMY Left 12/30/2021   Procedure: LEFT CAROTID ENDARTERECTOMY;  Surgeon: Chuck Hint, MD;  Location: [facility] OR;  Service: Vascular;  Laterality: Left;   EXPLORATION POST OPERATIVE OPEN HEART     FINGER SURGERY     skin graft on rt index   FINGER SURGERY Right    right index finger.   HERNIA REPAIR Right    RIH   JOINT REPLACEMENT     KNEE SURGERY  replacement on both knees   LEFT HEART CATH AND CORS/GRAFTS ANGIOGRAPHY N/A 05/21/2017   Procedure: Left Heart Cath and Cors/Grafts Angiography;  Surgeon: Tonny Bollman, MD;  Location: [facility] INVASIVE CV LAB;  Service: Cardiovascular;  Laterality: N/A;   LEFT HEART CATH AND CORS/GRAFTS ANGIOGRAPHY N/A 05/20/2020   Procedure: LEFT HEART CATH AND CORS/GRAFTS ANGIOGRAPHY;  Surgeon: Tonny Bollman, MD;  Location: [facility] INVASIVE CV LAB;  Service: Cardiovascular;  Laterality: N/A;   PACEMAKER IMPLANT N/A 03/14/2020   Procedure: PACEMAKER IMPLANT;  Surgeon: Marinus Maw, MD;  Location: [facility] INVASIVE CV LAB;  Service: Cardiovascular;  Laterality: N/A;   PROSTATE SURGERY     PVC ABLATION N/A 10/27/2017   Procedure: PVC ABLATION;  Surgeon:  Marinus Maw, MD;  Location: [facility] INVASIVE CV LAB;  Service: Cardiovascular;  Laterality: N/A;   TONSILLECTOMY     URINARY SURGERY     scar tissue    reports that he quit smoking about 52 years ago. His smoking use included cigarettes. He started smoking about 65 years ago. He has a 13 pack-year smoking history. He has never been exposed to tobacco smoke. He has never used smokeless tobacco. He reports that he does not drink alcohol and does not use drugs. family history includes Cancer in his sister; Diabetes in his brother and sister; Heart disease in his brother, father, and mother; Hyperlipidemia in his brother and father; Hypertension in his brother, father, and sister; Stroke in his mother. Allergies  Allergen Reactions   Lisinopril Swelling and Other (See Comments)    Angioedema.    Losartan Swelling and Other (See Comments)    Lips swell Angioedema    Nifedipine Swelling and Other (See Comments)    Sugar increase   Triamterene Swelling and Other (See Comments)    Face swells, no breathing impairment   Hydralazine Hcl Other (See Comments)    Fatigue; poor appetite   Losartan Potassium-Hctz Swelling and Other (See Comments)   Amoxicillin Rash and Other (See Comments)    Did it involve swelling of the face/tongue/throat, SOB, or low BP? No Did it involve sudden or severe rash/hives, skin peeling, or any reaction on the inside of your mouth or nose? No Did you need to seek medical attention at a hospital or doctor's office? No When did it last happen?      10 + years If all above answers are "NO", may proceed with cephalosporin use.     Ampicillin Rash and Other (See Comments)    Did it involve swelling of the face/tongue/throat, SOB, or low BP? No Did it involve sudden or severe rash/hives, skin peeling, or any reaction on the inside of your mouth or nose? No Did you need to seek medical attention at a hospital or doctor's office? No When did it last happen?      10 +  years If all above answers are "NO", may proceed with cephalosporin use.    Carvedilol Other (See Comments)    Low heart rate (in higher doses)   Current Outpatient Medications on File Prior to Visit  Medication Sig Dispense Refill   ACCU-CHEK GUIDE test strip USE 1 STRIP once DAILY 100 each 3   Accu-Chek Softclix Lancets lancets USE 1 once DAILY USE AS DIRECTED E11.9 100 each 3   apixaban (ELIQUIS) 2.5 MG TABS tablet Take 1 tablet (2.5 mg total) by mouth 2 (two) times daily. (0800 & 2000) 180 tablet 3   aspirin EC 81 MG tablet Take 1 tablet (  81 mg total) by mouth daily. 90 tablet 3   atorvastatin (LIPITOR) 40 MG tablet Take 40 mg by mouth at bedtime.      carvedilol (COREG) 3.125 MG tablet Take 1 tablet (3.125 mg total) by mouth daily at 12 noon. 30 tablet 6   cholecalciferol (VITAMIN D3) 25 MCG (1000 UNIT) tablet Take 1,000 Units by mouth every evening.     hydrALAZINE (APRESOLINE) 10 MG tablet Take 10 mg by mouth 3 (three) times daily as needed (elevated bp greater than 160).     magnesium oxide (MAG-OX) 400 MG tablet Take 800 mg by mouth 2 (two) times daily.      meclizine (ANTIVERT) 25 MG tablet Take 1 tablet (25 mg total) by mouth 3 (three) times daily as needed for dizziness. 30 tablet 0   Multiple Vitamins-Minerals (MENS 50+ MULTI VITAMIN/MIN PO) Take by mouth daily at 6 (six) AM.     nitroGLYCERIN (NITROSTAT) 0.4 MG SL tablet Place 1 tablet (0.4 mg total) under the tongue every 5 (five) minutes as needed for chest pain (x 3 doses). 30 tablet 3   OMEPRAZOLE PO Take 1 capsule by mouth daily.     pantoprazole (PROTONIX) 40 MG tablet Take 1 tablet (40 mg total) by mouth daily. 90 tablet 3   terazosin (HYTRIN) 1 MG capsule Take 1 mg by mouth at bedtime.  30 capsule 11   vitamin C (ASCORBIC ACID) 500 MG tablet Take 500 mg by mouth daily with breakfast.      Zinc 22.5 MG TABS Take 22.5 mg by mouth 2 (two) times a week.     No current facility-administered medications on file prior to  visit.        ROS:  All others reviewed and negative.  Objective        PE:  BP 120/76 (BP Location: Right Arm, Patient Position: Sitting, Cuff Size: Normal)   Pulse 81   Temp 98 F (36.7 C) (Oral)   Ht 6\' 2"  (1.88 m)   Wt 205 lb (93 kg)   SpO2 98%   BMI 26.32 kg/m                 Constitutional: Pt appears in NAD               HENT: Head: NCAT.                Right Ear: External ear normal.                 Left Ear: External ear normal.                Eyes: . Pupils are equal, round, and reactive to light. Conjunctivae and EOM are normal               Nose: without d/c or deformity               Neck: Neck supple. Gross normal ROM               Cardiovascular: Normal rate and regular rhythm.                 Pulmonary/Chest: Effort normal and breath sounds without rales or wheezing.                Abd:  Soft, NT, ND, + BS, no organomegaly               Neurological: Pt is alert. At baseline orientation, motor  grossly intact               Skin: Skin is warm. No rashes, no other new lesions, LE edema - none               Psychiatric: Pt behavior is normal without agitation   Micro: none  Cardiac tracings I have personally interpreted today:  none  Pertinent Radiological findings (summarize): none   Lab Results  Component Value Date   WBC 5.5 07/15/2023   HGB 13.3 07/15/2023   HCT 41.4 07/15/2023   PLT 105 (L) 07/15/2023   GLUCOSE 141 (H) 07/15/2023   CHOL 115 04/21/2023   TRIG 82.0 04/21/2023   HDL 40.10 04/21/2023   LDLDIRECT 67.0 09/16/2016   LDLCALC 58 04/21/2023   ALT 15 04/21/2023   AST 19 04/21/2023   NA 136 07/15/2023   K 4.9 07/15/2023   CL 99 07/15/2023   CREATININE 1.93 (H) 07/15/2023   BUN 30 (H) 07/15/2023   CO2 27 07/15/2023   TSH 1.72 04/21/2023   PSA 1.09 03/31/2018   INR 1.2 12/26/2021   HGBA1C 7.1 (A) 09/03/2023   MICROALBUR 4.9 (H) 04/21/2023   Hemoglobin A1C 4.0 - 5.6 % 7.1 Abnormal  7.3 High    Assessment/Plan:  Thomas Marshall is a 82  y.o. White or Caucasian [1] male with  has a past medical history of AAA (abdominal aortic aneurysm) (HCC), Arthritis, Cancer (HCC), Carotid artery disease (HCC), CKD (chronic kidney disease), stage III (HCC), Coronary artery disease, Diabetes mellitus without complication (HCC), GERD (gastroesophageal reflux disease), Hyperlipidemia, Hypertension, Medication intolerance (Multiple ), PAF (paroxysmal atrial fibrillation) (HCC), Pneumonia, Presence of permanent cardiac pacemaker, and Sinus bradycardia.  Type 2 diabetes mellitus with renal manifestations (HCC) Lab Results  Component Value Date   HGBA1C 7.1 (A) 09/03/2023   Stable, pt to continue current medical treatment  - diet, wt control   Hyperlipidemia Lab Results  Component Value Date   LDLCALC 58 04/21/2023   Stable, pt to continue current statin lipitor 40 qd   CKD (chronic kidney disease) stage 3, GFR 30-59 ml/min (HCC) Lab Results  Component Value Date   CREATININE 1.93 (H) 07/15/2023   Stable overall, cont to avoid nephrotoxins   Essential hypertension BP Readings from Last 3 Encounters:  09/03/23 120/76  08/19/23 104/64  07/15/23 126/81   Stable, pt to continue medical treatment coreg 3.125 bid, hydralazine 10 tid   Dyspnea Etiology unclear, for cxr, exam benign  Vitamin D deficiency Last vitamin D Lab Results  Component Value Date   VD25OH 66.63 04/21/2023   Stable, cont oral replacement   Allergic rhinitis Mild to mod, for claritin 10 mg every day prn,  to f/u any worsening symptoms or concerns Followup: Return in about 6 months (around 03/03/2024).  Oliver Barre, MD 09/05/2023 8:44 PM [facility] Internal Medicine

## 2023-09-05 ENCOUNTER — Encounter: Payer: Self-pay | Admitting: Internal Medicine

## 2023-09-05 NOTE — Assessment & Plan Note (Signed)
Mild to mod, for claritin 10 mg every day prn,  to f/u any worsening symptoms or concerns

## 2023-09-05 NOTE — Assessment & Plan Note (Signed)
Lab Results  Component Value Date   LDLCALC 58 04/21/2023   Stable, pt to continue current statin lipitor 40 qd

## 2023-09-05 NOTE — Assessment & Plan Note (Signed)
Etiology unclear, for cxr, exam benign

## 2023-09-05 NOTE — Assessment & Plan Note (Signed)
Lab Results  Component Value Date   CREATININE 1.93 (H) 07/15/2023   Stable overall, cont to avoid nephrotoxins

## 2023-09-05 NOTE — Assessment & Plan Note (Signed)
Last vitamin D Lab Results  Component Value Date   VD25OH 66.63 04/21/2023   Stable, cont oral replacement

## 2023-09-05 NOTE — Assessment & Plan Note (Signed)
BP Readings from Last 3 Encounters:  09/03/23 120/76  08/19/23 104/64  07/15/23 126/81   Stable, pt to continue medical treatment coreg 3.125 bid, hydralazine 10 tid

## 2023-09-05 NOTE — Assessment & Plan Note (Signed)
Lab Results  Component Value Date   HGBA1C 7.1 (A) 09/03/2023   Stable, pt to continue current medical treatment  - diet, wt control

## 2023-09-13 ENCOUNTER — Ambulatory Visit (INDEPENDENT_AMBULATORY_CARE_PROVIDER_SITE_OTHER): Payer: Medicare Other

## 2023-09-13 DIAGNOSIS — I495 Sick sinus syndrome: Secondary | ICD-10-CM | POA: Diagnosis not present

## 2023-09-15 LAB — CUP PACEART REMOTE DEVICE CHECK
Battery Remaining Longevity: 107 mo
Battery Remaining Percentage: 76 %
Battery Voltage: 3.02 V
Brady Statistic RV Percent Paced: 1 %
Date Time Interrogation Session: 20241014020014
Implantable Lead Connection Status: 753985
Implantable Lead Connection Status: 753985
Implantable Lead Implant Date: 20210415
Implantable Lead Implant Date: 20210415
Implantable Lead Location: 753859
Implantable Lead Location: 753860
Implantable Pulse Generator Implant Date: 20210415
Lead Channel Impedance Value: 460 Ohm
Lead Channel Pacing Threshold Amplitude: 0.75 V
Lead Channel Pacing Threshold Pulse Width: 0.5 ms
Lead Channel Sensing Intrinsic Amplitude: 7.3 mV
Lead Channel Setting Pacing Amplitude: 2.5 V
Lead Channel Setting Pacing Pulse Width: 0.5 ms
Lead Channel Setting Sensing Sensitivity: 2 mV
Pulse Gen Model: 2272
Pulse Gen Serial Number: 3813712

## 2023-09-21 DIAGNOSIS — L570 Actinic keratosis: Secondary | ICD-10-CM | POA: Diagnosis not present

## 2023-09-21 DIAGNOSIS — L821 Other seborrheic keratosis: Secondary | ICD-10-CM | POA: Diagnosis not present

## 2023-09-21 DIAGNOSIS — L578 Other skin changes due to chronic exposure to nonionizing radiation: Secondary | ICD-10-CM | POA: Diagnosis not present

## 2023-09-27 NOTE — Progress Notes (Signed)
Remote pacemaker transmission.   

## 2023-10-18 ENCOUNTER — Ambulatory Visit: Payer: Medicare Other | Admitting: Podiatry

## 2023-10-21 ENCOUNTER — Ambulatory Visit: Payer: Medicare Other | Admitting: Internal Medicine

## 2023-11-06 DIAGNOSIS — L570 Actinic keratosis: Secondary | ICD-10-CM | POA: Diagnosis not present

## 2023-11-06 DIAGNOSIS — L578 Other skin changes due to chronic exposure to nonionizing radiation: Secondary | ICD-10-CM | POA: Diagnosis not present

## 2023-11-06 DIAGNOSIS — L821 Other seborrheic keratosis: Secondary | ICD-10-CM | POA: Diagnosis not present

## 2023-11-06 DIAGNOSIS — C44629 Squamous cell carcinoma of skin of left upper limb, including shoulder: Secondary | ICD-10-CM | POA: Diagnosis not present

## 2023-11-08 ENCOUNTER — Ambulatory Visit (INDEPENDENT_AMBULATORY_CARE_PROVIDER_SITE_OTHER): Payer: Medicare Other | Admitting: Podiatry

## 2023-11-08 ENCOUNTER — Encounter: Payer: Self-pay | Admitting: Podiatry

## 2023-11-08 DIAGNOSIS — L84 Corns and callosities: Secondary | ICD-10-CM | POA: Diagnosis not present

## 2023-11-08 DIAGNOSIS — Z7901 Long term (current) use of anticoagulants: Secondary | ICD-10-CM

## 2023-11-08 DIAGNOSIS — M79674 Pain in right toe(s): Secondary | ICD-10-CM

## 2023-11-08 DIAGNOSIS — M79675 Pain in left toe(s): Secondary | ICD-10-CM

## 2023-11-08 DIAGNOSIS — B351 Tinea unguium: Secondary | ICD-10-CM

## 2023-11-09 ENCOUNTER — Encounter: Payer: Self-pay | Admitting: Internal Medicine

## 2023-11-09 ENCOUNTER — Ambulatory Visit: Payer: Medicare Other | Admitting: Internal Medicine

## 2023-11-09 VITALS — BP 124/82 | HR 105 | Temp 98.5°F | Ht 74.0 in | Wt 208.0 lb

## 2023-11-09 DIAGNOSIS — E1122 Type 2 diabetes mellitus with diabetic chronic kidney disease: Secondary | ICD-10-CM

## 2023-11-09 DIAGNOSIS — E559 Vitamin D deficiency, unspecified: Secondary | ICD-10-CM | POA: Diagnosis not present

## 2023-11-09 DIAGNOSIS — I10 Essential (primary) hypertension: Secondary | ICD-10-CM | POA: Diagnosis not present

## 2023-11-09 DIAGNOSIS — N1832 Chronic kidney disease, stage 3b: Secondary | ICD-10-CM

## 2023-11-09 DIAGNOSIS — I4819 Other persistent atrial fibrillation: Secondary | ICD-10-CM

## 2023-11-09 DIAGNOSIS — D61818 Other pancytopenia: Secondary | ICD-10-CM

## 2023-11-09 DIAGNOSIS — R040 Epistaxis: Secondary | ICD-10-CM

## 2023-11-09 DIAGNOSIS — E7800 Pure hypercholesterolemia, unspecified: Secondary | ICD-10-CM | POA: Diagnosis not present

## 2023-11-09 LAB — BASIC METABOLIC PANEL
BUN: 35 mg/dL — ABNORMAL HIGH (ref 6–23)
CO2: 33 meq/L — ABNORMAL HIGH (ref 19–32)
Calcium: 9.4 mg/dL (ref 8.4–10.5)
Chloride: 106 meq/L (ref 96–112)
Creatinine, Ser: 1.54 mg/dL — ABNORMAL HIGH (ref 0.40–1.50)
GFR: 41.21 mL/min — ABNORMAL LOW (ref >60.00)
Glucose, Bld: 84 mg/dL (ref 70–99)
Potassium: 5.3 meq/L — ABNORMAL HIGH (ref 3.5–5.1)
Sodium: 145 meq/L (ref 135–145)

## 2023-11-09 LAB — CBC WITH DIFFERENTIAL/PLATELET
Basophils Absolute: 0.1 10*3/uL (ref 0.0–0.1)
Basophils Relative: 1.9 % (ref 0.0–3.0)
Eosinophils Absolute: 0.6 10*3/uL (ref 0.0–0.7)
Eosinophils Relative: 10.2 % — ABNORMAL HIGH (ref 0.0–5.0)
HCT: 41.5 % (ref 39.0–52.0)
Hemoglobin: 13.2 g/dL (ref 13.0–17.0)
Lymphocytes Relative: 28.3 % (ref 12.0–46.0)
Lymphs Abs: 1.6 10*3/uL (ref 0.7–4.0)
MCHC: 31.8 g/dL (ref 30.0–36.0)
MCV: 99.5 fL (ref 78.0–100.0)
Monocytes Absolute: 0.5 10*3/uL (ref 0.1–1.0)
Monocytes Relative: 8.9 % (ref 3.0–12.0)
Neutro Abs: 2.8 10*3/uL (ref 1.4–7.7)
Neutrophils Relative %: 50.7 % (ref 43.0–77.0)
Platelets: 125 10*3/uL — ABNORMAL LOW (ref 150.0–400.0)
RBC: 4.17 Mil/uL — ABNORMAL LOW (ref 4.22–5.81)
RDW: 15 % (ref 11.5–15.5)
WBC: 5.6 10*3/uL (ref 4.0–10.5)

## 2023-11-09 LAB — HEMOGLOBIN A1C: Hgb A1c MFr Bld: 7.1 % — ABNORMAL HIGH (ref 4.6–6.5)

## 2023-11-09 NOTE — Assessment & Plan Note (Signed)
Stable, continue eliquix 2.5 bid

## 2023-11-09 NOTE — Assessment & Plan Note (Signed)
Lab Results  Component Value Date   HGBA1C 7.1 (A) 09/03/2023   Stable, pt to continue current medical treatment  - diet, wt control

## 2023-11-09 NOTE — Assessment & Plan Note (Signed)
BP Readings from Last 3 Encounters:  11/09/23 124/82  09/03/23 120/76  08/19/23 104/64   Stable, pt to continue medical treatment coreg 3.125 bid, hydralazine 10 tid

## 2023-11-09 NOTE — Assessment & Plan Note (Signed)
Lab Results  Component Value Date   LDLCALC 58 04/21/2023   Stable, pt to continue current statin lipitor 40 qd

## 2023-11-09 NOTE — Patient Instructions (Addendum)
Please continue all other medications as before, and refills have been done if requested.  Please have the pharmacy call with any other refills you may need.  Please continue your efforts at being more active, low cholesterol diet, and weight control.  Please keep your appointments with your specialists as you may have planned  You will be contacted regarding the referral for: ENT - urgent  Please go to the LAB at the blood drawing area for the tests to be done  You will be contacted by phone if any changes need to be made immediately.  Otherwise, you will receive a letter about your results with an explanation, but please check with MyChart first.  Please make an Appointment to return in Mar 03 2024, or sooner if needed

## 2023-11-09 NOTE — Progress Notes (Signed)
Patient ID: Thomas Marshall, male   DOB: 1941-11-02, 82 y.o.   MRN: [id number]  Subjective: Chief Complaint  Patient presents with   [facility]    Rm#14 [facility]      82 y.o. returns the office today for painful, elongated, thickened toenails which he cannot trim himself and for calluses on the right foot.  His wife has recently trimmed his nails so that as long.  He also gets calluses on the bottom of the right foot at that shift.  No open lesions.  No other concerns today.   He is on Eliquis  Last A1c was 7.1 on 09/03/2023  Thomas Levins, MD Last seen 09/03/2023   Objective: AAO 3, NAD DP/PT pulses palpable, CRT less than 3 seconds Protective sensation decreased with Thomas Marshall monofilament Nails hypertrophic, dystrophic, elongated, brittle, discolored 10. There is tenderness overlying the nails 1-5 bilaterally. There is no surrounding erythema or drainage along the nail sites. There is hyperkeratotic lesion right fifth metatarsal base. No ulceration, drainage or any signs of infection.  No pain with calf compression, swelling, warmth, erythema.  Assessment: Symptomatic onychomycosis; hyperkeratotic tissue  Plan: -Treatment options including alternatives, risks, complications were discussed -Nails sharply debrided 10 without complication/bleeding. -Sharply debrided the hyperkeratotic lesions x 1 right foot any complications or bleeding. -Discussed daily foot inspection. -Follow-up in 3 months or sooner if any problems are to arise. In the meantime, encouraged to call the office with any questions, concerns, changes symptoms.  Ovid Curd, DPM

## 2023-11-09 NOTE — Assessment & Plan Note (Signed)
Very mild recurrent x 1 wk but painless and persistent, for ENT referral

## 2023-11-09 NOTE — Progress Notes (Signed)
Patient ID: [name], male   DOB: May 17, 1941, 82 y.o.   MRN: [id number]        Chief Complaint: follow up recurrent right epistaxis, low platelets, anemia, chronic anticoagulation, [id number]       HPI:  Thomas Marshall is a 82 y.o. male here with c/o 1 wk onset very small but persistent leakage BRB per right nares, no pain or large amounts but persistent.  Has not had to see ED.  No other overt bleeding or bruising.  Taking low dose eliquis with hx of afib persisent with chronic fatigue.  Pt denies chest pain, increased sob or doe, wheezing, orthopnea, PND, increased LE swelling, palpitations, dizziness or syncope.   Pt denies polydipsia, polyuria, or new focal neuro s/s.    Pt denies fever, wt loss, night sweats, loss of appetite, or other constitutional symptoms        Wt Readings from Last 3 Encounters:  11/09/23 208 lb (94.3 kg)  09/03/23 205 lb (93 kg)  08/30/23 207 lb (93.9 kg)   BP Readings from Last 3 Encounters:  11/09/23 124/82  09/03/23 120/76  08/19/23 104/64         Past Medical History:  Diagnosis Date   AAA (abdominal aortic aneurysm) (HCC)    Arthritis    Cancer (HCC)    skin & squamous cell   Carotid artery disease (HCC)    a. s/p R CEA 12/2015.   CKD (chronic kidney disease), stage III (HCC)    a. per historical labs.   Coronary artery disease    a. s/p CABG 2004.   Diabetes mellitus without complication (HCC)    GERD (gastroesophageal reflux disease)    Hyperlipidemia    Hypertension    Medication intolerance Multiple    PAF (paroxysmal atrial fibrillation) (HCC)    Pneumonia    Presence of permanent cardiac pacemaker    Sinus bradycardia    a. baseline HR 50s-60s, also h/o bradycardia on metoprolol and carvedilol   Past Surgical History:  Procedure Laterality Date   ANKLE SURGERY     right fused   BACK SURGERY     low back    CARDIOVERSION N/A 02/14/2020   Procedure: CARDIOVERSION;  Surgeon: Sande Rives, MD;  Location: [facility] ENDOSCOPY;  Service:  Cardiovascular;  Laterality: N/A;   CARDIOVERSION N/A 02/26/2020   Procedure: CARDIOVERSION;  Surgeon: Chrystie Nose, MD;  Location: [facility] ENDOSCOPY;  Service: Cardiovascular;  Laterality: N/A;   CARDIOVERSION N/A 04/12/2020   Procedure: CARDIOVERSION;  Surgeon: Chilton Si, MD;  Location: [facility] ENDOSCOPY;  Service: Cardiovascular;  Laterality: N/A;   CHOLECYSTECTOMY N/A 12/07/2013   Procedure: LAPAROSCOPIC CHOLECYSTECTOMY ;  Surgeon: Emelia Loron, MD;  Location: [facility] OR;  Service: General;  Laterality: N/A;   COLONOSCOPY     CORONARY ARTERY BYPASS GRAFT  2004   [place]   CORONARY STENT INTERVENTION N/A 05/21/2017   Procedure: Coronary Stent Intervention;  Surgeon: Tonny Bollman, MD;  Location: [facility] INVASIVE CV LAB;  Service: Cardiovascular;  Laterality: N/A;   ENDARTERECTOMY Right 12/10/2015   Procedure: Right Carotid ENDARTERECTOMY;  Surgeon: Fransisco Hertz, MD;  Location: [facility] OR;  Service: Vascular;  Laterality: Right;   ENDARTERECTOMY Left 12/30/2021   Procedure: LEFT CAROTID ENDARTERECTOMY;  Surgeon: Chuck Hint, MD;  Location: [facility] OR;  Service: Vascular;  Laterality: Left;   EXPLORATION POST OPERATIVE OPEN HEART     FINGER SURGERY     skin graft on rt index   FINGER SURGERY  Right    right index finger.   HERNIA REPAIR Right    RIH   JOINT REPLACEMENT     KNEE SURGERY     replacement on both knees   LEFT HEART CATH AND CORS/GRAFTS ANGIOGRAPHY N/A 05/21/2017   Procedure: Left Heart Cath and Cors/Grafts Angiography;  Surgeon: Tonny Bollman, MD;  Location: [facility] INVASIVE CV LAB;  Service: Cardiovascular;  Laterality: N/A;   LEFT HEART CATH AND CORS/GRAFTS ANGIOGRAPHY N/A 05/20/2020   Procedure: LEFT HEART CATH AND CORS/GRAFTS ANGIOGRAPHY;  Surgeon: Tonny Bollman, MD;  Location: [facility] INVASIVE CV LAB;  Service: Cardiovascular;  Laterality: N/A;   PACEMAKER IMPLANT N/A 03/14/2020   Procedure: PACEMAKER IMPLANT;  Surgeon: Marinus Maw, MD;  Location: [facility] INVASIVE CV LAB;   Service: Cardiovascular;  Laterality: N/A;   PROSTATE SURGERY     PVC ABLATION N/A 10/27/2017   Procedure: PVC ABLATION;  Surgeon: Marinus Maw, MD;  Location: [facility] INVASIVE CV LAB;  Service: Cardiovascular;  Laterality: N/A;   TONSILLECTOMY     URINARY SURGERY     scar tissue    reports that he quit smoking about 52 years ago. His smoking use included cigarettes. He started smoking about 65 years ago. He has a 13 pack-year smoking history. He has never been exposed to tobacco smoke. He has never used smokeless tobacco. He reports that he does not drink alcohol and does not use drugs. family history includes Cancer in his sister; Diabetes in his brother and sister; Heart disease in his brother, father, and mother; Hyperlipidemia in his brother and father; Hypertension in his brother, father, and sister; Stroke in his mother. Allergies  Allergen Reactions   Lisinopril Swelling and Other (See Comments)    Angioedema.    Losartan Swelling and Other (See Comments)    Lips swell Angioedema    Nifedipine Swelling and Other (See Comments)    Sugar increase   Triamterene Swelling and Other (See Comments)    Face swells, no breathing impairment   Hydralazine Hcl Other (See Comments)    Fatigue; poor appetite   Losartan Potassium-Hctz Swelling and Other (See Comments)   Amoxicillin Rash and Other (See Comments)    Did it involve swelling of the face/tongue/throat, SOB, or low BP? No Did it involve sudden or severe rash/hives, skin peeling, or any reaction on the inside of your mouth or nose? No Did you need to seek medical attention at a hospital or doctor's office? No When did it last happen?      10 + years If all above answers are "NO", may proceed with cephalosporin use.     Ampicillin Rash and Other (See Comments)    Did it involve swelling of the face/tongue/throat, SOB, or low BP? No Did it involve sudden or severe rash/hives, skin peeling, or any reaction on the inside of your mouth  or nose? No Did you need to seek medical attention at a hospital or doctor's office? No When did it last happen?      10 + years If all above answers are "NO", may proceed with cephalosporin use.    Carvedilol Other (See Comments)    Low heart rate (in higher doses)   Current Outpatient Medications on File Prior to Visit  Medication Sig Dispense Refill   ACCU-CHEK GUIDE test strip USE 1 STRIP once DAILY 100 each 3   Accu-Chek Softclix Lancets lancets USE 1 once DAILY USE AS DIRECTED E11.9 100 each 3   apixaban (ELIQUIS) 2.5 MG  TABS tablet Take 1 tablet (2.5 mg total) by mouth 2 (two) times daily. (0800 & 2000) 180 tablet 3   aspirin EC 81 MG tablet Take 1 tablet (81 mg total) by mouth daily. 90 tablet 3   atorvastatin (LIPITOR) 40 MG tablet Take 40 mg by mouth at bedtime.      carvedilol (COREG) 3.125 MG tablet Take 1 tablet (3.125 mg total) by mouth daily at 12 noon. 30 tablet 6   cholecalciferol (VITAMIN D3) 25 MCG (1000 UNIT) tablet Take 1,000 Units by mouth every evening.     hydrALAZINE (APRESOLINE) 10 MG tablet Take 10 mg by mouth 3 (three) times daily as needed (elevated bp greater than 160).     magnesium oxide (MAG-OX) 400 MG tablet Take 800 mg by mouth 2 (two) times daily.      meclizine (ANTIVERT) 25 MG tablet Take 1 tablet (25 mg total) by mouth 3 (three) times daily as needed for dizziness. 30 tablet 0   Multiple Vitamins-Minerals (MENS 50+ MULTI VITAMIN/MIN PO) Take by mouth daily at 6 (six) AM.     nitroGLYCERIN (NITROSTAT) 0.4 MG SL tablet Place 1 tablet (0.4 mg total) under the tongue every 5 (five) minutes as needed for chest pain (x 3 doses). 30 tablet 3   OMEPRAZOLE PO Take 1 capsule by mouth daily.     pantoprazole (PROTONIX) 40 MG tablet Take 1 tablet (40 mg total) by mouth daily. 90 tablet 3   terazosin (HYTRIN) 1 MG capsule Take 1 mg by mouth at bedtime.  30 capsule 11   vitamin C (ASCORBIC ACID) 500 MG tablet Take 500 mg by mouth daily with breakfast.      Zinc 22.5  MG TABS Take 22.5 mg by mouth 2 (two) times a week.     No current facility-administered medications on file prior to visit.        ROS:  All others reviewed and negative.  Objective        PE:  BP 124/82 (BP Location: Right Arm, Patient Position: Sitting, Cuff Size: Normal)   Pulse (!) 105   Temp 98.5 F (36.9 C) (Oral)   Ht 6\' 2"  (1.88 m)   Wt 208 lb (94.3 kg)   SpO2 98%   BMI 26.71 kg/m                 Constitutional: Pt appears in NAD               HENT: Head: NCAT.                Right Ear: External ear normal.                 Left Ear: External ear normal.                Eyes: . Pupils are equal, round, and reactive to light. Conjunctivae and EOM are normal               Nose: without d/c or deformity               Neck: Neck supple. Gross normal ROM               Cardiovascular: Normal rate and regular rhythm.                 Pulmonary/Chest: Effort normal and breath sounds without rales or wheezing.  Abd:  Soft, NT, ND, + BS, no organomegaly               Neurological: Pt is alert. At baseline orientation, motor grossly intact               Skin: Skin is warm. No rashes, no other new lesions, LE edema - none               Psychiatric: Pt behavior is normal without agitation   Micro: none  Cardiac tracings I have personally interpreted today:  none  Pertinent Radiological findings (summarize): none   Lab Results  Component Value Date   WBC 5.5 07/15/2023   HGB 13.3 07/15/2023   HCT 41.4 07/15/2023   PLT 105 (L) 07/15/2023   GLUCOSE 141 (H) 07/15/2023   CHOL 115 04/21/2023   TRIG 82.0 04/21/2023   HDL 40.10 04/21/2023   LDLDIRECT 67.0 09/16/2016   LDLCALC 58 04/21/2023   ALT 15 04/21/2023   AST 19 04/21/2023   NA 136 07/15/2023   K 4.9 07/15/2023   CL 99 07/15/2023   CREATININE 1.93 (H) 07/15/2023   BUN 30 (H) 07/15/2023   CO2 27 07/15/2023   TSH 1.72 04/21/2023   PSA 1.09 03/31/2018   INR 1.2 12/26/2021   HGBA1C 7.1 (A) 09/03/2023    MICROALBUR 4.9 (H) 04/21/2023   Assessment/Plan:  VERNER REAMES is a 82 y.o. White or Caucasian [1] male with  has a past medical history of AAA (abdominal aortic aneurysm) (HCC), Arthritis, Cancer (HCC), Carotid artery disease (HCC), CKD (chronic kidney disease), stage III (HCC), Coronary artery disease, Diabetes mellitus without complication (HCC), GERD (gastroesophageal reflux disease), Hyperlipidemia, Hypertension, Medication intolerance (Multiple ), PAF (paroxysmal atrial fibrillation) (HCC), Pneumonia, Presence of permanent cardiac pacemaker, and Sinus bradycardia.  Vitamin D deficiency Last vitamin D Lab Results  Component Value Date   VD25OH 66.63 04/21/2023   Stable, cont oral replacement   Type 2 diabetes mellitus with renal manifestations (HCC) Lab Results  Component Value Date   HGBA1C 7.1 (A) 09/03/2023   Stable, pt to continue current medical treatment  - diet, wt control   Recurrent epistaxis Very mild recurrent x 1 wk but painless and persistent, for ENT referral  Essential hypertension BP Readings from Last 3 Encounters:  11/09/23 124/82  09/03/23 120/76  08/19/23 104/64   Stable, pt to continue medical treatment coreg 3.125 bid, hydralazine 10 tid   Persistent atrial fibrillation (HCC) Stable, continue eliquix 2.5 bid  Hyperlipidemia Lab Results  Component Value Date   LDLCALC 58 04/21/2023   Stable, pt to continue current statin lipitor 40 qd   Pancytopenia (HCC) With anemia and low plt - uncontrolled, for f/u cbc  Followup: Return in about 4 months (around 03/03/2024).  Oliver Barre, MD 11/09/2023 7:30 PM [facility] Internal Medicine

## 2023-11-09 NOTE — Assessment & Plan Note (Signed)
With anemia and low plt - uncontrolled, for f/u cbc

## 2023-11-09 NOTE — Assessment & Plan Note (Signed)
Last vitamin D Lab Results  Component Value Date   VD25OH 66.63 04/21/2023   Stable, cont oral replacement

## 2023-11-10 ENCOUNTER — Encounter: Payer: Self-pay | Admitting: Internal Medicine

## 2023-11-10 NOTE — Progress Notes (Signed)
The test results show that your current treatment is OK, as the tests are stable.  Please continue the same plan.  There is no other need for change of treatment or further evaluation based on these results, at this time.  thanks 

## 2023-11-19 ENCOUNTER — Encounter (INDEPENDENT_AMBULATORY_CARE_PROVIDER_SITE_OTHER): Payer: Self-pay

## 2023-11-19 ENCOUNTER — Ambulatory Visit (INDEPENDENT_AMBULATORY_CARE_PROVIDER_SITE_OTHER): Payer: Medicare Other

## 2023-11-19 VITALS — Ht 74.0 in | Wt 208.0 lb

## 2023-11-19 DIAGNOSIS — R040 Epistaxis: Secondary | ICD-10-CM | POA: Diagnosis not present

## 2023-11-19 DIAGNOSIS — J343 Hypertrophy of nasal turbinates: Secondary | ICD-10-CM | POA: Diagnosis not present

## 2023-11-19 DIAGNOSIS — J342 Deviated nasal septum: Secondary | ICD-10-CM

## 2023-11-19 MED ORDER — MUPIROCIN 2 % EX OINT: 1.0000 | TOPICAL_OINTMENT | Freq: Two times a day (BID) | CUTANEOUS | 0 refills | Status: AC

## 2023-11-19 NOTE — Patient Instructions (Signed)
Use mupirocin ointment - use pea sized amount on end of pinkie finger and put just on inside of nostril and pinch. No nose blowing for 1 week If have nose bleed, lean forward and pinch your nose for 10 minutes

## 2023-11-19 NOTE — Progress Notes (Unsigned)
Dear Dr. Jonny Ruiz, Here is my assessment for our mutual patient, Thomas Marshall. Thank you for allowing me the opportunity to care for your patient. Please do not hesitate to contact me should you have any other questions. Sincerely, Dr. Jovita Kussmaul  Otolaryngology Clinic Note  HISTORY: Thomas Marshall is a 82 y.o. male kindly referred by Dr. Jonny Ruiz for evaluation of epistaxis  Initial visit (10/2023): Epistaxis history: started 2 weeks ago; 4-5 episodes since; right side is problem side. Lasts about few minutes. He does not do anything for it, sometimes sticks a tissue up there. Few drops of blood, but generally stops on its own after pressure. He does have A-fib on eliquis Nose does feel dry, has had some runny nose/allergies recently and blowing nose more HTN: no CKD/Liver dysfunction: does have CKD Anticoagulation/AP: eliquis Trauma: no History of Sinusitis: no Nasal obstruction: no Nasal procedures: no Current nasal medication use: no  Tobacco: prior (quit several years ago)  PMHx: T2DM, HTN, A-fib on eliquis, HLD, Pancytopenia  RADIOGRAPHIC EVALUATION AND INDEPENDENT REVIEW OF OTHER RECORDS:: Dr. Raphael Gibney notes (11/09/2023): 1 week epistaxis, right side, no pain. Small volume. No ED, no other bleeding. On eliquis. Ref to ENT Labs:  11/09/2023 CBC: Plt 125; Hgb 13.2; prior Plt as low as 85 on 11/2021 11/08/2022 BMP: Cr 1.54, BUN 35  Past Medical History:  Diagnosis Date   AAA (abdominal aortic aneurysm) (HCC)    Arthritis    Cancer (HCC)    skin & squamous cell   Carotid artery disease (HCC)    a. s/p R CEA 12/2015.   CKD (chronic kidney disease), stage III (HCC)    a. per historical labs.   Coronary artery disease    a. s/p CABG 2004.   Diabetes mellitus without complication (HCC)    GERD (gastroesophageal reflux disease)    Hyperlipidemia    Hypertension    Medication intolerance Multiple    PAF (paroxysmal atrial fibrillation) (HCC)    Pneumonia    Presence of permanent  cardiac pacemaker    Sinus bradycardia    a. baseline HR 50s-60s, also h/o bradycardia on metoprolol and carvedilol   Past Surgical History:  Procedure Laterality Date   ANKLE SURGERY     right fused   BACK SURGERY     low back    CARDIOVERSION N/A 02/14/2020   Procedure: CARDIOVERSION;  Surgeon: Sande Rives, MD;  Location[facility] ENDOSCOPY;  Service: Cardiovascular;  Laterality: N/A;   CARDIOVERSION N/A 02/26/2020   Procedure: CARDIOVERSION;  Surgeon: Chrystie Nose, MD;  Location: [facility] ENDOSCOPY;  Service: Cardiovascular;  Laterality: N/A;   CARDIOVERSION N/A 04/12/2020   Procedure: CARDIOVERSION;  Surgeon: Chilton Si, MD;  Location: [facility] ENDOSCOPY;  Service: Cardiovascular;  Laterality: N/A;   CHOLECYSTECTOMY N/A 12/07/2013   Procedure: LAPAROSCOPIC CHOLECYSTECTOMY ;  Surgeon: Emelia Loron, MD;  Location: [facility] OR;  Service: General;  Laterality: N/A;   COLONOSCOPY     CORONARY ARTERY BYPASS GRAFT  2004   [place]   CORONARY STENT INTERVENTION N/A 05/21/2017   Procedure: Coronary Stent Intervention;  Surgeon: Tonny Bollman, MD;  Location: [facility] INVASIVE CV LAB;  Service: Cardiovascular;  Laterality: N/A;   ENDARTERECTOMY Right 12/10/2015   Procedure: Right Carotid ENDARTERECTOMY;  Surgeon: Fransisco Hertz, MD;  Location: [facility] OR;  Service: Vascular;  Laterality: Right;   ENDARTERECTOMY Left 12/30/2021   Procedure: LEFT CAROTID ENDARTERECTOMY;  Surgeon: Chuck Hint, MD;  Location: [facility] OR;  Service: Vascular;  Laterality: Left;  EXPLORATION POST OPERATIVE OPEN HEART     FINGER SURGERY     skin graft on rt index   FINGER SURGERY Right    right index finger.   HERNIA REPAIR Right    RIH   JOINT REPLACEMENT     KNEE SURGERY     replacement on both knees   LEFT HEART CATH AND CORS/GRAFTS ANGIOGRAPHY N/A 05/21/2017   Procedure: Left Heart Cath and Cors/Grafts Angiography;  Surgeon: Tonny Bollman, MD;  Location: [facility] INVASIVE CV LAB;  Service: Cardiovascular;  Laterality:  N/A;   LEFT HEART CATH AND CORS/GRAFTS ANGIOGRAPHY N/A 05/20/2020   Procedure: LEFT HEART CATH AND CORS/GRAFTS ANGIOGRAPHY;  Surgeon: Tonny Bollman, MD;  Location: [facility] INVASIVE CV LAB;  Service: Cardiovascular;  Laterality: N/A;   PACEMAKER IMPLANT N/A 03/14/2020   Procedure: PACEMAKER IMPLANT;  Surgeon: Marinus Maw, MD;  Location: [facility] INVASIVE CV LAB;  Service: Cardiovascular;  Laterality: N/A;   PROSTATE SURGERY     PVC ABLATION N/A 10/27/2017   Procedure: PVC ABLATION;  Surgeon: Marinus Maw, MD;  Location: [facility] INVASIVE CV LAB;  Service: Cardiovascular;  Laterality: N/A;   TONSILLECTOMY     URINARY SURGERY     scar tissue   Family History  Problem Relation Age of Onset   Stroke Mother    Heart disease Mother    Heart disease Father    Hypertension Father    Hyperlipidemia Father    Cancer Sister        breast  and skin   Diabetes Sister    Hypertension Sister    Diabetes Brother    Heart disease Brother    Hyperlipidemia Brother    Hypertension Brother    Early death Neg Hx    Social History   Tobacco Use   Smoking status: Former    Current packs/day: 0.00    Average packs/day: 1 pack/day for 13.0 years (13.0 ttl pk-yrs)    Types: Cigarettes    Start date: 11/30/1957    Quit date: 11/30/1970    Years since quitting: 53.0    Passive exposure: Never   Smokeless tobacco: Never  Substance Use Topics   Alcohol use: No    Alcohol/week: 0.0 standard drinks of alcohol   Allergies  Allergen Reactions   Lisinopril Swelling and Other (See Comments)    Angioedema.    Losartan Swelling and Other (See Comments)    Lips swell Angioedema    Nifedipine Swelling and Other (See Comments)    Sugar increase   Triamterene Swelling and Other (See Comments)    Face swells, no breathing impairment   Hydralazine Hcl Other (See Comments)    Fatigue; poor appetite   Losartan Potassium-Hctz Swelling and Other (See Comments)   Amoxicillin Rash and Other (See Comments)    Did it  involve swelling of the face/tongue/throat, SOB, or low BP? No Did it involve sudden or severe rash/hives, skin peeling, or any reaction on the inside of your mouth or nose? No Did you need to seek medical attention at a hospital or doctor's office? No When did it last happen?      10 + years If all above answers are "NO", may proceed with cephalosporin use.     Ampicillin Rash and Other (See Comments)    Did it involve swelling of the face/tongue/throat, SOB, or low BP? No Did it involve sudden or severe rash/hives, skin peeling, or any reaction on the inside of your mouth or nose? No Did  you need to seek medical attention at a hospital or doctor's office? No When did it last happen?      10 + years If all above answers are "NO", may proceed with cephalosporin use.    Carvedilol Other (See Comments)    Low heart rate (in higher doses)   Current Outpatient Medications  Medication Sig Dispense Refill   ACCU-CHEK GUIDE test strip USE 1 STRIP once DAILY 100 each 3   Accu-Chek Softclix Lancets lancets USE 1 once DAILY USE AS DIRECTED E11.9 100 each 3   apixaban (ELIQUIS) 2.5 MG TABS tablet Take 1 tablet (2.5 mg total) by mouth 2 (two) times daily. (0800 & 2000) 180 tablet 3   aspirin EC 81 MG tablet Take 1 tablet (81 mg total) by mouth daily. 90 tablet 3   atorvastatin (LIPITOR) 40 MG tablet Take 40 mg by mouth at bedtime.      carvedilol (COREG) 3.125 MG tablet Take 1 tablet (3.125 mg total) by mouth daily at 12 noon. 30 tablet 6   cholecalciferol (VITAMIN D3) 25 MCG (1000 UNIT) tablet Take 1,000 Units by mouth every evening.     hydrALAZINE (APRESOLINE) 10 MG tablet Take 10 mg by mouth 3 (three) times daily as needed (elevated bp greater than 160).     magnesium oxide (MAG-OX) 400 MG tablet Take 800 mg by mouth 2 (two) times daily.      meclizine (ANTIVERT) 25 MG tablet Take 1 tablet (25 mg total) by mouth 3 (three) times daily as needed for dizziness. 30 tablet 0   Multiple  Vitamins-Minerals (MENS 50+ MULTI VITAMIN/MIN PO) Take by mouth daily at 6 (six) AM.     mupirocin ointment (BACTROBAN) 2 % Apply 1 Application topically 2 (two) times daily. use pea sized amount on end of pinkie finger and put just on inside of nostril and pinch 22 g 0   nitroGLYCERIN (NITROSTAT) 0.4 MG SL tablet Place 1 tablet (0.4 mg total) under the tongue every 5 (five) minutes as needed for chest pain (x 3 doses). 30 tablet 3   OMEPRAZOLE PO Take 1 capsule by mouth daily.     pantoprazole (PROTONIX) 40 MG tablet Take 1 tablet (40 mg total) by mouth daily. 90 tablet 3   terazosin (HYTRIN) 1 MG capsule Take 1 mg by mouth at bedtime.  30 capsule 11   vitamin C (ASCORBIC ACID) 500 MG tablet Take 500 mg by mouth daily with breakfast.      Zinc 22.5 MG TABS Take 22.5 mg by mouth 2 (two) times a week.     No current facility-administered medications for this visit.   Ht 6\' 2"  (1.88 m)   Wt 208 lb (94.3 kg)   BMI 26.71 kg/m   PHYSICAL EXAM:  Ht 6\' 2"  (1.88 m)   Wt 208 lb (94.3 kg)   BMI 26.71 kg/m    Salient findings:  CN II-XII intact  Bilateral EAC clear and TM intact with well pneumatized middle ear spaces Nose: Anterior rhinoscopy reveals mild nasal septal deviation and bilateral inferior turbinate mild hypertrophy.  Nasal endoscopy with endoscopic control of epistaxis was indicated to better evaluate the nose and paranasal sinuses, and for treatment of epistaxis, given the patient's history and exam findings, and is detailed below. No lesions of oral cavity/oropharynx No obviously palpable neck masses/lymphadenopathy/thyromegaly No respiratory distress or stridor   PROCEDURE:  PROCEDURE: Bilateral Rigid Nasal Endoscopy with endoscopic control of RIGHT sided epistaxis (CPT 29562) Pre-procedure diagnosis: Epistaxis -  right Post-procedure diagnosis: same Indication: See pre-procedure diagnosis and physical exam above Complications: None apparent EBL: 0 mL Anesthesia: Lidocaine 4%  and topical decongestant was topically sprayed in each nasal cavity  Description of Procedure:  Patient was identified as correct patient.  Afrin/lidocaine mix was sprayed into the nose in both nasal cavities. There was no bleeding from posterior oropharynx. The decongestant was allowed to work. A headlight and speculum were used but unable to identify any potential source of epistaxis. Therefore, nasal endoscopy was necessary to evaluate and control the source of epistaxis. A rigid 30 degree endoscope was utilized to evaluate the sinonasal cavities, mucosa, sinus ostia and turbinates and septum bilaterally. On the left, MM and SE recess were clear and there were no noted prominent vessels on septum or inferior turbinate. On the contralateral side, there was an area over mid-septum which had a tuft of vessels which was identified as most likely source of bleeding. Otherwise, MM and SE recess were clear and no other prominent vessels were noted.  After discussion of R/B/A, decision was made to perform cauterization after consent. The areas of bleeding were endoscopically visualized and then spot cauterized using silver nitrate cautery. Patient was observed and no further bleeding was seen. Mupirocin ointment was applied to both nares. Patient tolerated the procedure well.   ASSESSMENT:  Mr. Domenick Bookbinder is a 82 yo M with history of CKD and A-fib on eliquis with:  1. Recurrent epistaxis   2. Nasal septal deviation   3. Hypertrophy of both inferior nasal turbinates    We discussed issues and options today.  We reviewed the nasal endoscopy images together.  The risks, benefits and alternatives were discussed and questions answered.  He has elected to proceed with cauterization which was performed. Will initiate humdification and discussed epistaxis precautions  1) Ayr Gel BID 2) Mupirocin ointment BID nares x10d (see below) 3) Epistaxis precautions discussed 2) Follow-up in 6 weeks -- sooner as  necessary.  See below regarding exact medications prescribed this encounter including dosages and route: Meds ordered this encounter  Medications   mupirocin ointment (BACTROBAN) 2 %    Sig: Apply 1 Application topically 2 (two) times daily. use pea sized amount on end of pinkie finger and put just on inside of nostril and pinch    Dispense:  22 g    Refill:  0     Thank you for allowing me the opportunity to care for your patient. Please do not hesitate to contact me should you have any other questions.  Sincerely, Jovita Kussmaul, MD Otolarynoglogist (ENT), [facility] Phone: [phone] Fax: [phone]  MDM:  Level 4: [phone] Complexity/Problems addressed: low - acute problem Data complexity: mod - independent interpretation of notes, labs - Morbidity: mod   - Prescription Drug prescribed or managed: yes  11/24/2023, 4:37 PM

## 2023-12-02 ENCOUNTER — Telehealth: Payer: Self-pay | Admitting: Internal Medicine

## 2023-12-02 ENCOUNTER — Encounter: Payer: Self-pay | Admitting: Cardiovascular Disease

## 2023-12-02 ENCOUNTER — Ambulatory Visit: Payer: Medicare Other | Attending: Cardiovascular Disease | Admitting: Cardiovascular Disease

## 2023-12-02 VITALS — BP 124/66 | HR 60 | Ht 74.0 in | Wt 211.6 lb

## 2023-12-02 DIAGNOSIS — I4821 Permanent atrial fibrillation: Secondary | ICD-10-CM | POA: Diagnosis present

## 2023-12-02 DIAGNOSIS — I2510 Atherosclerotic heart disease of native coronary artery without angina pectoris: Secondary | ICD-10-CM | POA: Diagnosis present

## 2023-12-02 DIAGNOSIS — E785 Hyperlipidemia, unspecified: Secondary | ICD-10-CM | POA: Insufficient documentation

## 2023-12-02 DIAGNOSIS — I350 Nonrheumatic aortic (valve) stenosis: Secondary | ICD-10-CM | POA: Insufficient documentation

## 2023-12-02 DIAGNOSIS — I6523 Occlusion and stenosis of bilateral carotid arteries: Secondary | ICD-10-CM | POA: Diagnosis not present

## 2023-12-02 MED ORDER — ACCU-CHEK GUIDE CONTROL VI LIQD: 1 refills | Status: AC

## 2023-12-02 MED ORDER — ACCU-CHEK GUIDE TEST VI STRP: ORAL_STRIP | 12 refills | Status: DC

## 2023-12-02 MED ORDER — ACCU-CHEK GUIDE ME W/DEVICE KIT: PACK | 0 refills | Status: AC

## 2023-12-02 NOTE — Telephone Encounter (Signed)
 Pt states his diabetes tester kit is not working, he took it to the pharmacy to have them re-calibrate the machine, but it is till broken.   Pt is requesting a replacement machine.

## 2023-12-02 NOTE — Telephone Encounter (Signed)
 Ok but please ask pt to determine which one is covered currently by his insurance, as there are many to choose from   thanks

## 2023-12-02 NOTE — Telephone Encounter (Signed)
Ok this is done erx 

## 2023-12-02 NOTE — Progress Notes (Signed)
 Cardiology Office Note:    Date:  12/02/2023   ID:  Thomas Marshall, DOB 1941/02/23, MRN [id number]  PCP:  Norleen Lynwood ORN, MD   [facility] Providers Cardiologist:  Ozell Fell, MD Electrophysiologist:  Danelle Birmingham, MD     Referring MD: Norleen Lynwood ORN, MD   Chief Complaint  Patient presents with   Atrial Fibrillation    History of Present Illness:    Thomas Marshall is a 83 y.o. male with a hx of multiple cardiovascular issues presenting for follow-up evaluation.  Pertinent problems include coronary artery disease status post remote four-vessel CABG in 2004, symptomatic PVCs status post ablation in 2018, persistent atrial fibrillation on chronic anticoagulation, carotid arterial disease status post bilateral carotid endarterectomy, hypertension, hyperlipidemia, diabetes, and chronic kidney disease.   Current Medications: Current Meds  Medication Sig   Accu-Chek Softclix Lancets lancets USE 1 once DAILY USE AS DIRECTED E11.9   apixaban  (ELIQUIS ) 2.5 MG TABS tablet Take 1 tablet (2.5 mg total) by mouth 2 (two) times daily. (0800 & 2000)   aspirin  EC 81 MG tablet Take 1 tablet (81 mg total) by mouth daily.   atorvastatin  (LIPITOR) 40 MG tablet Take 40 mg by mouth at bedtime.    carvedilol  (COREG ) 3.125 MG tablet Take 1 tablet (3.125 mg total) by mouth daily at 12 noon.   cholecalciferol  (VITAMIN D3) 25 MCG (1000 UNIT) tablet Take 1,000 Units by mouth every evening.   hydrALAZINE  (APRESOLINE ) 10 MG tablet Take 10 mg by mouth 3 (three) times daily as needed (elevated bp greater than 160).   magnesium  oxide (MAG-OX) 400 MG tablet Take 800 mg by mouth 2 (two) times daily.    meclizine  (ANTIVERT ) 25 MG tablet Take 1 tablet (25 mg total) by mouth 3 (three) times daily as needed for dizziness.   Multiple Vitamins-Minerals (MENS 50+ MULTI VITAMIN/MIN PO) Take by mouth daily at 6 (six) AM.   mupirocin  ointment (BACTROBAN ) 2 % Apply 1 Application topically 2 (two) times daily. use pea  sized amount on end of pinkie finger and put just on inside of nostril and pinch   nitroGLYCERIN  (NITROSTAT ) 0.4 MG SL tablet Place 1 tablet (0.4 mg total) under the tongue every 5 (five) minutes as needed for chest pain (x 3 doses).   OMEPRAZOLE PO Take 1 capsule by mouth daily.   pantoprazole  (PROTONIX ) 40 MG tablet Take 1 tablet (40 mg total) by mouth daily.   terazosin  (HYTRIN ) 1 MG capsule Take 1 mg by mouth at bedtime.    vitamin C  (ASCORBIC ACID ) 500 MG tablet Take 500 mg by mouth daily with breakfast.    Zinc 22.5 MG TABS Take 22.5 mg by mouth 2 (two) times a week.   [DISCONTINUED] ACCU-CHEK GUIDE test strip USE 1 STRIP once DAILY     Allergies:   Lisinopril, Losartan, Nifedipine, Triamterene, Hydralazine  hcl, Losartan potassium-hctz, Amoxicillin, Ampicillin, and Carvedilol    ROS:   Please see the history of present illness.    All other systems reviewed and are negative.  EKGs/Labs/Other Studies Reviewed:    The following studies were reviewed today: Cardiac Studies & Procedures   CARDIAC CATHETERIZATION  CARDIAC CATHETERIZATION 05/20/2020  Narrative 1.  Severe native vessel coronary artery disease with total occlusion of the left main and total occlusion of the RCA 2.  Status post aortocoronary bypass surgery with continued patency of the RIMA to PDA, LIMA to LAD, saphenous vein graft to diagonal, and saphenous vein graft OM  Recommendations: Ongoing  medical therapy.  No targets for intervention.  Note there is a moderately severe stenosis at the distal anastomosis of the saphenous vein graft OM, likely a mechanical issue that has been present since the time of surgery.  This is unchanged over time and there is brisk TIMI-3 flow, very unlikely to be related to the patient's symptoms.  Findings Coronary Findings Diagnostic  Dominance: Right  Left Main Ost LM to LM lesion is 100% stenosed. The left main is critically stenosed with minimal flow beyond the area of severe  stenosis.  Left Circumflex  First Obtuse Marginal Branch 1st Mrg lesion is 60% stenosed.  Right Coronary Artery Mid RCA to Dist RCA lesion is 100% stenosed.  RIMA RIMA Graft To RPDA RIMA. The RIMA to PDA graft is patent.  This is imaged from right radial access because of known severe tortuosity on the previous study.  The graft is patent with no stenosis.  The anastomosis is somewhat difficult to visualize but appears widely patent with no stenosis.  The PDA and PLA branches fill from the graft.  Saphenous Graft To Ramus SVG. The SVG to ramus intermedius has a critical degenerated 95% eccentric lesion with TIMI 2 flow beyond the lesion. The lesion is located in the proximal body of the graft. Previously placed Prox Graft drug eluting stent is widely patent. The lesion is eccentric. An AL-1 guide catheter is used. Angiomax  was used for coagulation. The right femoral sheath was upsized to a 6 French. A therapeutic ACT is achieved. The lesion is crossed with cougar wire. Verapamil  was administered through the guide once the bypass graft is selectively engaged. The lesion is predilated with a 2.5 mm balloon. Verapamil  was administered again. A 5 mm spider distal embolic protection device was prepped and advanced beyond the lesion. The lesion is then stented with a 4.0 x 16 mm Promus DES deployed at 14 atm. The spider embolic protection device is removed and final angiography confirmed 0% residual stenosis and TIMI-3 flow.  Saphenous Graft To 1st Mrg SVG. The SVG to OM is patent with diffuse irregularity. The OM branch where the vessel is tented up has an unusual appearance just beyond the distal anastomosis. There is at least 50-70% stenosis in that region.  This is unchanged from the previous study.  LIMA LIMA Graft To Mid LAD LIMA. The LIMA to mid LAD is widely patent with no stenosis. The LAD beyond the LIMA insertion site is patent.  This is unchanged from the previous study.  Saphenous Graft  To 1st Diag SVG. SVG to diagonal is widely patent.  The stented segment in the proximal body of the graft is widely patent. Prox Graft to Mid Graft lesion is 20% stenosed. The lesion was previously treated using a drug eluting stent over 2 years ago.  Intervention  No interventions have been documented.   CARDIAC CATHETERIZATION  CARDIAC CATHETERIZATION 05/21/2017  Narrative 1. Severe native coronary artery disease with total occlusion of the left main and total occlusion of the RCA 2. Status post aortocoronary bypass surgery with continued patency of the LIMA to LAD and RIMA to RCA, patency of the saphenous vein graft to OM1 with a moderate eccentric lesion just distal to the graft insertion site, and patency of the saphenous vein graft to ramus intermedius with a critical lesion in the proximal body the graft treated successfully with PCI using distal embolic protection and a 4.0 mm drug-eluting stent  Recommendations: The patient will be hydrated for 6 hours.  He will be discharged this evening with follow-up labs next week. Recommend aspirin , Plavix , and Eliquis  1 month, then discontinue aspirin . He should start back on Eliquis  tomorrow evening.  Findings Coronary Findings Diagnostic  Dominance: Right  Left Main The left main is critically stenosed with minimal flow beyond the area of severe stenosis.  Right Coronary Artery  RIMA RIMA Graft To RPDA RIMA graft was visualized by non-selective angiography. The RIMA to PDA graft is patent. The right subclavian artery is very tortuous and will not allow for a catheter to be advanced to the area of the RIMA. The vessel was imaged nonselectively and is demonstrated to be patent. The distal anastomosis is poorly visualized but there is TIMI 3 flow.  Saphenous Graft To Ramus SVG. The SVG to ramus intermedius has a critical degenerated 95% eccentric lesion with TIMI 2 flow beyond the lesion. The lesion is located in the proximal body of the  graft. The lesion is eccentric. An AL-1 guide catheter is used. Angiomax  was used for coagulation. The right femoral sheath was upsized to a 6 French. A therapeutic ACT is achieved. The lesion is crossed with cougar wire. Verapamil  was administered through the guide once the bypass graft is selectively engaged. The lesion is predilated with a 2.5 mm balloon. Verapamil  was administered again. A 5 mm spider distal embolic protection device was prepped and advanced beyond the lesion. The lesion is then stented with a 4.0 x 16 mm Promus DES deployed at 14 atm. The spider embolic protection device is removed and final angiography confirmed 0% residual stenosis and TIMI-3 flow.  Saphenous Graft To 1st Mrg SVG. The SVG to OM is patent with diffuse irregularity. The OM branch where the vessel is tented up has an unusual appearance just beyond the distal anastomosis. There is at least 50-70% stenosis in that region.  LIMA LIMA Graft To Mid LAD LIMA. The LIMA to mid LAD is widely patent with no stenosis. The LAD beyond the LIMA insertion site is patent.  Intervention  Prox Graft lesion (Saphenous Graft To Ramus) Angioplasty Lesion crossed with guidewire. Pre-stent angioplasty was performed. A STENT PROMUS PREM MR [id number] drug eluting stent was successfully placed. Post-stent angioplasty was not performed. The pre-interventional distal flow is decreased (TIMI 2).  The post-interventional distal flow is normal (TIMI 3). No complications occurred at this lesion. There is a 0% residual stenosis post intervention.    ECHOCARDIOGRAM  ECHOCARDIOGRAM COMPLETE 01/18/2023  Narrative ECHOCARDIOGRAM REPORT    Patient Name:   JOHNWILLIAM SHEPPERSON  Date of Exam: 01/18/2023 Medical Rec #:  [id number]     Height:       74.0 in Accession #:    [id number]    Weight:       215.0 lb Date of Birth:  03/05/1941      BSA:          2.241 m Patient Age:    81 years      BP:           97/67 mmHg Patient Gender: M             HR:            71 bpm. Exam Location:  [address]  Procedure: 2D Echo, Cardiac Doppler and Color Doppler  Indications:    I35.0 Aortic Stenosis  History:        Patient has prior history of Echocardiogram examinations, most recent 12/10/2021. CAD, Pacemaker and Prior CABG, Carotid Disease, Arrythmias:Atrial  Fibrillation; Risk Factors:Hypertension, Diabetes and HLD.  Sonographer:    Waldo Guadalajara RCS Referring Phys: 63 TESSA N CONTE  IMPRESSIONS   1. Left ventricular ejection fraction, by estimation, is 55 to 60%. The left ventricle has normal function. The left ventricle has no regional wall motion abnormalities. There is mild left ventricular hypertrophy. Left ventricular diastolic parameters are indeterminate. 2. Right ventricular systolic function is normal. The right ventricular size is normal. There is normal pulmonary artery systolic pressure. 3. Left atrial size was moderately dilated. 4. Right atrial size was mildly dilated. 5. Trivial mitral valve regurgitation. 6. AV is thickened, calcified with restricted motion Peak and mean gradients through the valve are 11 and 7 mm Hg respectively AVA (VTI) is 1.38 cm2 Dimensionless index is 0.5 consistent with mild AS> No significant change from echo in NOv 2023 . Aortic valve regurgitation is not visualized.  Comparison(s): The left ventricular function is unchanged.  FINDINGS Left Ventricle: Left ventricular ejection fraction, by estimation, is 55 to 60%. The left ventricle has normal function. The left ventricle has no regional wall motion abnormalities. The left ventricular internal cavity size was normal in size. There is mild left ventricular hypertrophy. Left ventricular diastolic parameters are indeterminate.  Right Ventricle: The right ventricular size is normal. Right vetricular wall thickness was not assessed. Right ventricular systolic function is normal. There is normal pulmonary artery systolic pressure. The tricuspid  regurgitant velocity is 2.13 m/s, and with an assumed right atrial pressure of 3 mmHg, the estimated right ventricular systolic pressure is 21.1 mmHg.  Left Atrium: Left atrial size was moderately dilated.  Right Atrium: Right atrial size was mildly dilated.  Pericardium: There is no evidence of pericardial effusion.  Mitral Valve: There is mild thickening of the mitral valve leaflet(s). Mild mitral annular calcification. Trivial mitral valve regurgitation.  Tricuspid Valve: The tricuspid valve is normal in structure. Tricuspid valve regurgitation is mild.  Aortic Valve: AV is thickened, calcified with restricted motion Peak and mean gradients through the valve are 11 and 7 mm Hg respectively AVA (VTI) is 1.38 cm2 Dimensionless index is 0.5 consistent with mild AS> No significant change from echo in NOv 2023. Aortic valve regurgitation is not visualized. Aortic valve mean gradient measures 7.2 mmHg. Aortic valve peak gradient measures 11.5 mmHg. Aortic valve area, by VTI measures 1.56 cm.  Pulmonic Valve: The pulmonic valve was normal in structure. Pulmonic valve regurgitation is trivial.  Aorta: The aortic root and ascending aorta are structurally normal, with no evidence of dilitation.  IAS/Shunts: No atrial level shunt detected by color flow Doppler.   LEFT VENTRICLE PLAX 2D LVIDd:         4.00 cm   Diastology LVIDs:         2.90 cm   LV e' medial:    5.22 cm/s LV PW:         1.20 cm   LV E/e' medial:  19.6 LV IVS:        1.00 cm   LV e' lateral:   8.27 cm/s LVOT diam:     2.00 cm   LV E/e' lateral: 12.4 LV SV:         56 LV SV Index:   25 LVOT Area:     3.14 cm   RIGHT VENTRICLE RV Basal diam:  3.50 cm RV S prime:     9.68 cm/s TAPSE (M-mode): 2.4 cm RVSP:           21.1 mmHg  LEFT  ATRIUM              Index        RIGHT ATRIUM           Index LA diam:        5.20 cm  2.32 cm/m   RA Pressure: 3.00 mmHg LA Vol (A2C):   92.6 ml  41.32 ml/m  RA Area:     20.50 cm LA  Vol (A4C):   105.0 ml 46.85 ml/m  RA Volume:   65.20 ml  29.09 ml/m LA Biplane Vol: 99.3 ml  44.30 ml/m AORTIC VALVE AV Area (Vmax):    1.48 cm AV Area (Vmean):   1.48 cm AV Area (VTI):     1.56 cm AV Vmax:           169.50 cm/s AV Vmean:          125.750 cm/s AV VTI:            0.357 m AV Peak Grad:      11.5 mmHg AV Mean Grad:      7.2 mmHg LVOT Vmax:         80.07 cm/s LVOT Vmean:        59.167 cm/s LVOT VTI:          0.177 m LVOT/AV VTI ratio: 0.50  AORTA Ao Root diam: 4.00 cm Ao Asc diam:  3.60 cm  MITRAL VALVE                TRICUSPID VALVE MV Area (PHT):              TR Peak grad:   18.1 mmHg MV Decel Time:              TR Vmax:        213.00 cm/s MV E velocity: 102.20 cm/s  Estimated RAP:  3.00 mmHg RVSP:           21.1 mmHg  SHUNTS Systemic VTI:  0.18 m Systemic Diam: 2.00 cm  Vina Gull MD Electronically signed by Vina Gull MD Signature Date/Time: 01/18/2023/5:52:01 PM    Final             EKG:        Recent Labs: 04/21/2023: ALT 15; TSH 1.72 11/09/2023: BUN 35; Creatinine, Ser 1.54; Hemoglobin 13.2; Platelets 125.0; Potassium 5.3 No hemolysis seen; Sodium 145  Recent Lipid Panel    Component Value Date/Time   CHOL 115 04/21/2023 1337   TRIG 82.0 04/21/2023 1337   HDL 40.10 04/21/2023 1337   CHOLHDL 3 04/21/2023 1337   VLDL 16.4 04/21/2023 1337   LDLCALC 58 04/21/2023 1337   LDLDIRECT 67.0 09/16/2016 1455     Risk Assessment/Calculations:    CHA2DS2-VASc Score = 4   This indicates a 4.8% annual risk of stroke. The patient's score is based upon: CHF History: 0 HTN History: 1 Diabetes History: 0 Stroke History: 0 Vascular Disease History: 1 Age Score: 2 Gender Score: 0               Physical Exam:    VS:  BP 124/66   Pulse 60   Ht 6' 2 (1.88 m)   Wt 211 lb 9.6 oz (96 kg)   SpO2 99%   BMI 27.17 kg/m     Wt Readings from Last 3 Encounters:  12/02/23 211 lb 9.6 oz (96 kg)  11/19/23 208 lb (94.3 kg)  11/09/23 208 lb  (94.3 kg)  GEN:  Well nourished, well developed in no acute distress HEENT: Normal NECK: No JVD; No carotid bruits LYMPHATICS: No lymphadenopathy CARDIAC: Irregularly irregular, 2/6 systolic murmur at the right upper sternal border RESPIRATORY:  Clear to auscultation without rales, wheezing or rhonchi  ABDOMEN: Soft, non-tender, non-distended MUSCULOSKELETAL:  No edema; No deformity  SKIN: Warm and dry NEUROLOGIC:  Alert and oriented x 3 PSYCHIATRIC:  Normal affect   Assessment & Plan Permanent atrial fibrillation [facility]) The patient has chronic fatigue and exercise intolerance that he attributes to his atrial fibrillation.  He now has permanent atrial fibrillation and is limited to rate control strategies.  Will continue current management.  He is tolerating apixaban  for anticoagulation. Coronary artery disease involving native coronary artery of native heart without angina pectoris Status post remote CABG.  Continues on aspirin , atorvastatin , carvedilol .  No changes made today. Hyperlipidemia LDL goal <70 LDL cholesterol 58 mg/dL.  Continue atorvastatin  40 mg daily. Bilateral extracranial carotid artery stenosis Status post bilateral carotid endarterectomy.  Asymptomatic at this time. Mild aortic stenosis Exam remains consistent with mild aortic stenosis.  Last echocardiogram from February 2024 reviewed with a mean gradient of 7 mmHg and evidence of thickened and mildly restricted aortic valve leaflets on echo.  Repeat echo next year before his office visit.            Medication Adjustments/Labs and Tests Ordered: Current medicines are reviewed at length with the patient today.  Concerns regarding medicines are outlined above.  Orders Placed This Encounter  Procedures   ECHOCARDIOGRAM COMPLETE   No orders of the defined types were placed in this encounter.   Patient Instructions  Testing/Procedures: ECHO (prior to next year's visit) Your physician has requested that  you have an echocardiogram. Echocardiography is a painless test that uses sound waves to create images of your heart. It provides your doctor with information about the size and shape of your heart and how well your heart's chambers and valves are working. This procedure takes approximately one hour. There are no restrictions for this procedure. Please do NOT wear cologne, perfume, aftershave, or lotions (deodorant is allowed). Please arrive 15 minutes prior to your appointment time.  Please note: We ask at that you not bring children with you during ultrasound (echo/ vascular) testing. Due to room size and safety concerns, children are not allowed in the ultrasound rooms during exams. Our front office staff cannot provide observation of children in our lobby area while testing is being conducted. An adult accompanying a patient to their appointment will only be allowed in the ultrasound room at the discretion of the ultrasound technician under special circumstances. We apologize for any inconvenience.  Follow-Up: At [facility], you and your health needs are our priority.  As part of our continuing mission to provide you with exceptional heart care, we have created designated Provider Care Teams.  These Care Teams include your primary Cardiologist (physician) and Advanced Practice Providers (APPs -  Physician Assistants and Nurse Practitioners) who all work together to provide you with the care you need, when you need it.  Your next appointment:   1 year(s)  Provider:   Ozell Fell, MD        Signed, Ozell Fell, MD  12/02/2023 4:35 PM    [facility]

## 2023-12-02 NOTE — Patient Instructions (Signed)
 Testing/Procedures: ECHO (prior to next year's visit) Your physician has requested that you have an echocardiogram. Echocardiography is a painless test that uses sound waves to create images of your heart. It provides your doctor with information about the size and shape of your heart and how well your heart's chambers and valves are working. This procedure takes approximately one hour. There are no restrictions for this procedure. Please do NOT wear cologne, perfume, aftershave, or lotions (deodorant is allowed). Please arrive 15 minutes prior to your appointment time.  Please note: We ask at that you not bring children with you during ultrasound (echo/ vascular) testing. Due to room size and safety concerns, children are not allowed in the ultrasound rooms during exams. Our front office staff cannot provide observation of children in our lobby area while testing is being conducted. An adult accompanying a patient to their appointment will only be allowed in the ultrasound room at the discretion of the ultrasound technician under special circumstances. We apologize for any inconvenience.  Follow-Up: At [facility], you and your health needs are our priority.  As part of our continuing mission to provide you with exceptional heart care, we have created designated Provider Care Teams.  These Care Teams include your primary Cardiologist (physician) and Advanced Practice Providers (APPs -  Physician Assistants and Nurse Practitioners) who all work together to provide you with the care you need, when you need it.  Your next appointment:   1 year(s)  Provider:   Tonny Bollman, MD

## 2023-12-02 NOTE — Assessment & Plan Note (Signed)
 Status post remote CABG.  Continues on aspirin, atorvastatin, carvedilol.  No changes made today.

## 2023-12-06 ENCOUNTER — Other Ambulatory Visit: Payer: Self-pay | Admitting: Internal Medicine

## 2023-12-08 LAB — LAB REPORT - SCANNED: EGFR: 40

## 2023-12-13 ENCOUNTER — Ambulatory Visit (INDEPENDENT_AMBULATORY_CARE_PROVIDER_SITE_OTHER): Payer: Medicare Other

## 2023-12-13 DIAGNOSIS — I495 Sick sinus syndrome: Secondary | ICD-10-CM | POA: Diagnosis not present

## 2023-12-13 LAB — CUP PACEART REMOTE DEVICE CHECK
Battery Remaining Longevity: 104 mo
Battery Remaining Percentage: 75 %
Battery Voltage: 3.02 V
Brady Statistic RV Percent Paced: 1 %
Date Time Interrogation Session: 20250113020011
Implantable Lead Connection Status: 753985
Implantable Lead Connection Status: 753985
Implantable Lead Implant Date: 20210415
Implantable Lead Implant Date: 20210415
Implantable Lead Location: 753859
Implantable Lead Location: 753860
Implantable Pulse Generator Implant Date: 20210415
Lead Channel Impedance Value: 440 Ohm
Lead Channel Pacing Threshold Amplitude: 0.75 V
Lead Channel Pacing Threshold Pulse Width: 0.5 ms
Lead Channel Sensing Intrinsic Amplitude: 6.7 mV
Lead Channel Setting Pacing Amplitude: 2.5 V
Lead Channel Setting Pacing Pulse Width: 0.5 ms
Lead Channel Setting Sensing Sensitivity: 2 mV
Pulse Gen Model: 2272
Pulse Gen Serial Number: 3813712

## 2023-12-31 ENCOUNTER — Ambulatory Visit (INDEPENDENT_AMBULATORY_CARE_PROVIDER_SITE_OTHER): Payer: Medicare Other

## 2023-12-31 ENCOUNTER — Encounter (INDEPENDENT_AMBULATORY_CARE_PROVIDER_SITE_OTHER): Payer: Self-pay

## 2024-01-24 NOTE — Progress Notes (Signed)
 Remote pacemaker transmission.

## 2024-02-07 ENCOUNTER — Ambulatory Visit: Payer: Medicare Other | Admitting: Podiatry

## 2024-02-07 ENCOUNTER — Ambulatory Visit (INDEPENDENT_AMBULATORY_CARE_PROVIDER_SITE_OTHER): Payer: Medicare Other | Admitting: Podiatry

## 2024-02-07 ENCOUNTER — Encounter: Payer: Self-pay | Admitting: Podiatry

## 2024-02-07 DIAGNOSIS — E1140 Type 2 diabetes mellitus with diabetic neuropathy, unspecified: Secondary | ICD-10-CM

## 2024-02-07 DIAGNOSIS — M79675 Pain in left toe(s): Secondary | ICD-10-CM

## 2024-02-07 DIAGNOSIS — B351 Tinea unguium: Secondary | ICD-10-CM

## 2024-02-07 DIAGNOSIS — M79674 Pain in right toe(s): Secondary | ICD-10-CM | POA: Diagnosis not present

## 2024-02-07 NOTE — Progress Notes (Signed)
 This patient returns to my office for at risk foot care.  This patient requires this care by a professional since this patient will be at risk due to having diabetes and CKD..  This patient is unable to cut nails himself since the patient cannot reach his nails.These nails are painful walking and wearing shoes.  This patient presents for at risk foot care today.  General Appearance  Alert, conversant and in no acute stress.  Vascular  Dorsalis pedis and posterior tibial  pulses are palpable  bilaterally.  Capillary return is within normal limits  bilaterally. Temperature is within normal limits  bilaterally.  Neurologic  Senn-Weinstein monofilament wire test within normal limits  bilaterally. Muscle power within normal limits bilaterally.  Nails Thick disfigured discolored nails with subungual debris  from hallux to fifth toes bilaterally. No evidence of bacterial infection or drainage bilaterally.  Orthopedic  No limitations of motion  feet .  No crepitus or effusions noted.  No bony pathology or digital deformities noted.  Skin  normotropic skin with no porokeratosis noted bilaterally.  No signs of infections or ulcers noted.     Onychomycosis  Pain in right toes  Pain in left toes  Consent was obtained for treatment procedures.   Mechanical debridement of nails 1-5  bilaterally performed with a nail nipper.  Filed with dremel without incident.    Return office visit     3 months                 Told patient to return for periodic foot care and evaluation due to potential at risk complications.   Helane Gunther DPM

## 2024-02-13 ENCOUNTER — Other Ambulatory Visit: Payer: Self-pay | Admitting: Internal Medicine

## 2024-02-13 DIAGNOSIS — E1122 Type 2 diabetes mellitus with diabetic chronic kidney disease: Secondary | ICD-10-CM

## 2024-02-14 ENCOUNTER — Other Ambulatory Visit: Payer: Self-pay

## 2024-02-15 ENCOUNTER — Other Ambulatory Visit: Payer: Self-pay | Admitting: Internal Medicine

## 2024-02-15 NOTE — Telephone Encounter (Signed)
 [facility] called and spoke to Thomas Marshall, Pensions consultant about the refill(s) lancets requested. Advised it was sent on 02/14/24 #100/0 refill(s). She says they have the prescription and will have them ready for pickup today.

## 2024-02-15 NOTE — Telephone Encounter (Signed)
 Copied from CRM [id number]. Topic: Clinical - Prescription Issue >> Feb 15, 2024 10:46 AM Efraim Kaufmann C wrote: Reason for CRM: patient calling regarding Accu-Chek Softclix Lancets lancets. He is in great need of them and thought the prescription was going through but now they don't have anything for him. Please advise.

## 2024-02-24 ENCOUNTER — Other Ambulatory Visit: Payer: Medicare Other

## 2024-03-03 ENCOUNTER — Ambulatory Visit (INDEPENDENT_AMBULATORY_CARE_PROVIDER_SITE_OTHER): Payer: Medicare Other | Admitting: Internal Medicine

## 2024-03-03 ENCOUNTER — Encounter: Payer: Self-pay | Admitting: Internal Medicine

## 2024-03-03 VITALS — BP 122/64 | HR 85 | Temp 99.5°F | Ht 74.0 in | Wt 200.0 lb

## 2024-03-03 DIAGNOSIS — E7800 Pure hypercholesterolemia, unspecified: Secondary | ICD-10-CM

## 2024-03-03 DIAGNOSIS — J069 Acute upper respiratory infection, unspecified: Secondary | ICD-10-CM | POA: Diagnosis not present

## 2024-03-03 DIAGNOSIS — E559 Vitamin D deficiency, unspecified: Secondary | ICD-10-CM

## 2024-03-03 DIAGNOSIS — I10 Essential (primary) hypertension: Secondary | ICD-10-CM | POA: Diagnosis not present

## 2024-03-03 DIAGNOSIS — N1832 Chronic kidney disease, stage 3b: Secondary | ICD-10-CM | POA: Diagnosis not present

## 2024-03-03 DIAGNOSIS — E1122 Type 2 diabetes mellitus with diabetic chronic kidney disease: Secondary | ICD-10-CM | POA: Diagnosis not present

## 2024-03-03 MED ORDER — AZITHROMYCIN 250 MG PO TABS: ORAL_TABLET | ORAL | 1 refills | Status: AC

## 2024-03-03 NOTE — Progress Notes (Signed)
 Patient ID: Thomas Marshall, male   DOB: 25-Jun-1941, 83 y.o.   MRN: [id number]         Chief Complaint:: yearly exam and URI       HPI:  Thomas Marshall is a 83 y.o. male  Here with 2-3 days acute onset fever, facial pain, pressure, headache, general weakness and malaise, and greenish d/c, with mild ST and cough, but pt denies chest pain, wheezing, increased sob or doe, orthopnea, PND, increased LE swelling, palpitations, dizziness or syncope.  Had recent full panel of labs at [place]. Declines further lab now.  Pt denies polydipsia, polyuria, or new focal neuro s/s.    Pt denies recent wt loss, night sweats, loss of appetite, or other constitutional symptoms  Wt Readings from Last 3 Encounters:  03/03/24 200 lb (90.7 kg)  12/02/23 211 lb 9.6 oz (96 kg)  11/19/23 208 lb (94.3 kg)   BP Readings from Last 3 Encounters:  03/03/24 122/64  12/02/23 124/66  11/09/23 124/82   Immunization History  Administered Date(s) Administered   Fluad Quad(high Dose 65+) 07/26/2019, 08/30/2020, 08/09/2021, 08/21/2022   Fluad Trivalent(High Dose 65+) 08/31/2023   Influenza Split 10/13/2011   Influenza, High Dose Seasonal PF 08/17/2013, 09/08/2013, 07/31/2014, 08/31/2015, 07/31/2016, 07/22/2017, 08/30/2017, 08/04/2018   Influenza,inj,Quad PF,6+ Mos 08/20/2014, 08/13/2015, 07/31/2016   Influenza-Unspecified 09/29/2004, 09/23/2005, 11/30/2006, 07/31/2009, 08/22/2009, 08/23/2010, 08/31/2011, 10/13/2011, 10/28/2011, 08/08/2012, 08/31/2019, 07/31/2020, 11/30/2020, 07/31/2021, 10/31/2023   PFIZER(Purple Top)SARS-COV-2 Vaccination 12/20/2019, 01/08/2020, 11/26/2020   Pneumococcal Conjugate-13 09/10/2014   Pneumococcal Polysaccharide-23 10/30/2006, 05/22/2016   Pneumococcal-Unspecified 12/06/2003, 11/11/2006, 10/30/2008   Tdap 04/03/2014   Health Maintenance Due  Topic Date Due   Zoster Vaccines- Shingrix (1 of 2) Never done      Past Medical History:  Diagnosis Date   AAA (abdominal aortic aneurysm) (HCC)    Arthritis     Cancer (HCC)    skin & squamous cell   Carotid artery disease (HCC)    a. s/p R CEA 12/2015.   CKD (chronic kidney disease), stage III (HCC)    a. per historical labs.   Coronary artery disease    a. s/p CABG 2004.   Diabetes mellitus without complication (HCC)    GERD (gastroesophageal reflux disease)    Hyperlipidemia    Hypertension    Medication intolerance Multiple    PAF (paroxysmal atrial fibrillation) (HCC)    Pneumonia    Presence of permanent cardiac pacemaker    Sinus bradycardia    a. baseline HR 50s-60s, also h/o bradycardia on metoprolol and carvedilol   Past Surgical History:  Procedure Laterality Date   ANKLE SURGERY     right fused   BACK SURGERY     low back    CARDIOVERSION N/A 02/14/2020   Procedure: CARDIOVERSION;  Surgeon: Sande Rives, MD;  Location: [facility] ENDOSCOPY;  Service: Cardiovascular;  Laterality: N/A;   CARDIOVERSION N/A 02/26/2020   Procedure: CARDIOVERSION;  Surgeon: Chrystie Nose, MD;  Location: [facility] ENDOSCOPY;  Service: Cardiovascular;  Laterality: N/A;   CARDIOVERSION N/A 04/12/2020   Procedure: CARDIOVERSION;  Surgeon: Chilton Si, MD;  Location: [facility] ENDOSCOPY;  Service: Cardiovascular;  Laterality: N/A;   CHOLECYSTECTOMY N/A 12/07/2013   Procedure: LAPAROSCOPIC CHOLECYSTECTOMY ;  Surgeon: Emelia Loron, MD;  Location: [facility] OR;  Service: General;  Laterality: N/A;   COLONOSCOPY     CORONARY ARTERY BYPASS GRAFT  2004   [place]   CORONARY STENT INTERVENTION N/A 05/21/2017   Procedure: Coronary Stent Intervention;  Surgeon: Tonny Bollman,  MD;  Location: [facility] INVASIVE CV LAB;  Service: Cardiovascular;  Laterality: N/A;   ENDARTERECTOMY Right 12/10/2015   Procedure: Right Carotid ENDARTERECTOMY;  Surgeon: Fransisco Hertz, MD;  Location: [facility] OR;  Service: Vascular;  Laterality: Right;   ENDARTERECTOMY Left 12/30/2021   Procedure: LEFT CAROTID ENDARTERECTOMY;  Surgeon: Chuck Hint, MD;  Location: [facility] OR;  Service: Vascular;   Laterality: Left;   EXPLORATION POST OPERATIVE OPEN HEART     FINGER SURGERY     skin graft on rt index   FINGER SURGERY Right    right index finger.   HERNIA REPAIR Right    RIH   JOINT REPLACEMENT     KNEE SURGERY     replacement on both knees   LEFT HEART CATH AND CORS/GRAFTS ANGIOGRAPHY N/A 05/21/2017   Procedure: Left Heart Cath and Cors/Grafts Angiography;  Surgeon: Tonny Bollman, MD;  Location: [facility] INVASIVE CV LAB;  Service: Cardiovascular;  Laterality: N/A;   LEFT HEART CATH AND CORS/GRAFTS ANGIOGRAPHY N/A 05/20/2020   Procedure: LEFT HEART CATH AND CORS/GRAFTS ANGIOGRAPHY;  Surgeon: Tonny Bollman, MD;  Location: [facility] INVASIVE CV LAB;  Service: Cardiovascular;  Laterality: N/A;   PACEMAKER IMPLANT N/A 03/14/2020   Procedure: PACEMAKER IMPLANT;  Surgeon: Marinus Maw, MD;  Location: [facility] INVASIVE CV LAB;  Service: Cardiovascular;  Laterality: N/A;   PROSTATE SURGERY     PVC ABLATION N/A 10/27/2017   Procedure: PVC ABLATION;  Surgeon: Marinus Maw, MD;  Location: [facility] INVASIVE CV LAB;  Service: Cardiovascular;  Laterality: N/A;   TONSILLECTOMY     URINARY SURGERY     scar tissue    reports that he quit smoking about 53 years ago. His smoking use included cigarettes. He started smoking about 66 years ago. He has a 13 pack-year smoking history. He has never been exposed to tobacco smoke. He has never used smokeless tobacco. He reports that he does not drink alcohol and does not use drugs. family history includes Cancer in his sister; Diabetes in his brother and sister; Heart disease in his brother, father, and mother; Hyperlipidemia in his brother and father; Hypertension in his brother, father, and sister; Stroke in his mother. Allergies  Allergen Reactions   Lisinopril Swelling and Other (See Comments)    Angioedema.    Losartan Swelling and Other (See Comments)    Lips swell Angioedema    Nifedipine Swelling and Other (See Comments)    Sugar increase   Triamterene Swelling  and Other (See Comments)    Face swells, no breathing impairment   Hydralazine Hcl Other (See Comments)    Fatigue; poor appetite   Losartan Potassium-Hctz Swelling and Other (See Comments)   Amoxicillin Rash and Other (See Comments)    Did it involve swelling of the face/tongue/throat, SOB, or low BP? No Did it involve sudden or severe rash/hives, skin peeling, or any reaction on the inside of your mouth or nose? No Did you need to seek medical attention at a hospital or doctor's office? No When did it last happen?      10 + years If all above answers are "NO", may proceed with cephalosporin use.     Ampicillin Rash and Other (See Comments)    Did it involve swelling of the face/tongue/throat, SOB, or low BP? No Did it involve sudden or severe rash/hives, skin peeling, or any reaction on the inside of your mouth or nose? No Did you need to seek medical attention at a hospital or doctor's office?  No When did it last happen?      10 + years If all above answers are "NO", may proceed with cephalosporin use.    Carvedilol Other (See Comments)    Low heart rate (in higher doses)   Current Outpatient Medications on File Prior to Visit  Medication Sig Dispense Refill   Accu-Chek Softclix Lancets lancets USE 1  TO CHECK GLUCOSE ONCE DAILY AS DIRECTED 100 each 0   apixaban (ELIQUIS) 2.5 MG TABS tablet Take 1 tablet (2.5 mg total) by mouth 2 (two) times daily. (0800 & 2000) 180 tablet 3   aspirin EC 81 MG tablet Take 1 tablet (81 mg total) by mouth daily. 90 tablet 3   atorvastatin (LIPITOR) 40 MG tablet Take 40 mg by mouth at bedtime.      Blood Glucose Calibration (ACCU-CHEK GUIDE CONTROL) LIQD Use as directed once daily as needed E11.9 1 each 1   Blood Glucose Monitoring Suppl (ACCU-CHEK GUIDE ME) w/Device KIT Use as directed four times per day E11.9 1 kit 0   carvedilol (COREG) 3.125 MG tablet Take 1 tablet (3.125 mg total) by mouth daily at 12 noon. 30 tablet 6   cholecalciferol (VITAMIN  D3) 25 MCG (1000 UNIT) tablet Take 1,000 Units by mouth every evening.     glucose blood (ACCU-CHEK GUIDE TEST) test strip use as directed once daily E11.9 100 each 12   hydrALAZINE (APRESOLINE) 10 MG tablet Take 10 mg by mouth 3 (three) times daily as needed (elevated bp greater than 160).     magnesium oxide (MAG-OX) 400 MG tablet Take 800 mg by mouth 2 (two) times daily.      meclizine (ANTIVERT) 25 MG tablet Take 1 tablet (25 mg total) by mouth 3 (three) times daily as needed for dizziness. 30 tablet 0   Multiple Vitamins-Minerals (MENS 50+ MULTI VITAMIN/MIN PO) Take by mouth daily at 6 (six) AM.     mupirocin ointment (BACTROBAN) 2 % Apply 1 Application topically 2 (two) times daily. use pea sized amount on end of pinkie finger and put just on inside of nostril and pinch 22 g 0   nitroGLYCERIN (NITROSTAT) 0.4 MG SL tablet Place 1 tablet (0.4 mg total) under the tongue every 5 (five) minutes as needed for chest pain (x 3 doses). 30 tablet 3   OMEPRAZOLE PO Take 1 capsule by mouth daily.     terazosin (HYTRIN) 1 MG capsule Take 1 mg by mouth at bedtime.  30 capsule 11   vitamin C (ASCORBIC ACID) 500 MG tablet Take 500 mg by mouth daily with breakfast.      Zinc 22.5 MG TABS Take 22.5 mg by mouth 2 (two) times a week.     pantoprazole (PROTONIX) 40 MG tablet Take 1 tablet (40 mg total) by mouth daily. 90 tablet 3   No current facility-administered medications on file prior to visit.        ROS:  All others reviewed and negative.  Objective        PE:  BP 122/64 (BP Location: Right Arm, Patient Position: Sitting, Cuff Size: Normal)   Pulse 85   Temp 99.5 F (37.5 C) (Oral)   Ht 6\' 2"  (1.88 m)   Wt 200 lb (90.7 kg)   SpO2 99%   BMI 25.68 kg/m                 Constitutional: Pt appears in NAD  HENT: Head: NCAT.                Right Ear: External ear normal.                 Left Ear: External ear normal.  Bilat tm's with mild erythema.  Max sinus areas mild tender.   Pharynx with mild erythema, no exudate               Eyes: . Pupils are equal, round, and reactive to light. Conjunctivae and EOM are normal               Nose: without d/c or deformity               Neck: Neck supple. Gross normal ROM               Cardiovascular: Normal rate and regular rhythm.                 Pulmonary/Chest: Effort normal and breath sounds without rales or wheezing.                Abd:  Soft, NT, ND, + BS, no organomegaly               Neurological: Pt is alert. At baseline orientation, motor grossly intact               Skin: Skin is warm. No rashes, no other new lesions, LE edema - none               Psychiatric: Pt behavior is normal without agitation   Micro: none  Cardiac tracings I have personally interpreted today:  none  Pertinent Radiological findings (summarize): none   Lab Results  Component Value Date   WBC 5.6 11/09/2023   HGB 13.2 11/09/2023   HCT 41.5 11/09/2023   PLT 125.0 (L) 11/09/2023   GLUCOSE 84 11/09/2023   CHOL 115 04/21/2023   TRIG 82.0 04/21/2023   HDL 40.10 04/21/2023   LDLDIRECT 67.0 09/16/2016   LDLCALC 58 04/21/2023   ALT 15 04/21/2023   AST 19 04/21/2023   NA 145 11/09/2023   K 5.3 No hemolysis seen (H) 11/09/2023   CL 106 11/09/2023   CREATININE 1.54 (H) 11/09/2023   BUN 35 (H) 11/09/2023   CO2 33 (H) 11/09/2023   TSH 1.72 04/21/2023   PSA 1.09 03/31/2018   INR 1.2 12/26/2021   HGBA1C 7.1 (H) 11/09/2023   MICROALBUR 4.9 (H) 04/21/2023   Assessment/Plan:  TOMOTHY EDDINS is a 83 y.o. White or Caucasian [1] male with  has a past medical history of AAA (abdominal aortic aneurysm) (HCC), Arthritis, Cancer (HCC), Carotid artery disease (HCC), CKD (chronic kidney disease), stage III (HCC), Coronary artery disease, Diabetes mellitus without complication (HCC), GERD (gastroesophageal reflux disease), Hyperlipidemia, Hypertension, Medication intolerance (Multiple ), PAF (paroxysmal atrial fibrillation) (HCC), Pneumonia, Presence of  permanent cardiac pacemaker, and Sinus bradycardia.  Acute upper respiratory infection Mild to mod, for antibx course zpack x 1,,  to f/u any worsening symptoms or concerns  Vitamin D deficiency Last vitamin D Lab Results  Component Value Date   VD25OH 66.63 04/21/2023   Stable, cont oral replacement   Essential hypertension BP Readings from Last 3 Encounters:  03/03/24 122/64  12/02/23 124/66  11/09/23 124/82   Stable, pt to continue medical treatment coreg 3.125 bid, hydralazin 10 tid   CKD (chronic kidney disease) stage 3, GFR 30-59 ml/min (HCC) Lab Results  Component Value  Date   CREATININE 1.54 (H) 11/09/2023   Stable overall, cont to avoid nephrotoxins   Hyperlipidemia Lab Results  Component Value Date   LDLCALC 58 04/21/2023   Stable, pt to continue current statin lipitor 40 mg qd   Type 2 diabetes mellitus with renal manifestations (HCC) Lab Results  Component Value Date   HGBA1C 7.1 (H) 11/09/2023   Mild uncontrolled, pt to continue DM diet, declines add farxiga for now  Followup: Return in about 6 months (around 09/02/2024).  Oliver Barre, MD 03/05/2024 7:49 PM [facility]

## 2024-03-03 NOTE — Patient Instructions (Signed)
Please take all new medication as prescribed - the antibiotic  Please continue all other medications as before, and refills have been done if requested.  Please have the pharmacy call with any other refills you may need.  Please continue your efforts at being more active, low cholesterol diet, and weight control.  You are otherwise up to date with prevention measures today.  Please keep your appointments with your specialists as you may have planned  Please make an Appointment to return in 6 months, or sooner if needed

## 2024-03-05 ENCOUNTER — Encounter: Payer: Self-pay | Admitting: Internal Medicine

## 2024-03-05 DIAGNOSIS — J069 Acute upper respiratory infection, unspecified: Secondary | ICD-10-CM | POA: Insufficient documentation

## 2024-03-05 NOTE — Assessment & Plan Note (Signed)
Lab Results  Component Value Date   LDLCALC 58 04/21/2023   Stable, pt to continue current statin lipitor 40 mg qd

## 2024-03-05 NOTE — Assessment & Plan Note (Signed)
 Lab Results  Component Value Date   CREATININE 1.54 (H) 11/09/2023   Stable overall, cont to avoid nephrotoxins

## 2024-03-05 NOTE — Assessment & Plan Note (Signed)
 Lab Results  Component Value Date   HGBA1C 7.1 (H) 11/09/2023   Mild uncontrolled, pt to continue DM diet, declines add farxiga for now

## 2024-03-05 NOTE — Assessment & Plan Note (Signed)
Last vitamin D Lab Results  Component Value Date   VD25OH 66.63 04/21/2023   Stable, cont oral replacement

## 2024-03-05 NOTE — Assessment & Plan Note (Signed)
 Mild to mod, for antibx course zpack x 1,,  to f/u any worsening symptoms or concerns

## 2024-03-05 NOTE — Assessment & Plan Note (Signed)
 BP Readings from Last 3 Encounters:  03/03/24 122/64  12/02/23 124/66  11/09/23 124/82   Stable, pt to continue medical treatment coreg 3.125 bid, hydralazin 10 tid

## 2024-03-13 ENCOUNTER — Ambulatory Visit (INDEPENDENT_AMBULATORY_CARE_PROVIDER_SITE_OTHER): Payer: Medicare Other

## 2024-03-13 DIAGNOSIS — I495 Sick sinus syndrome: Secondary | ICD-10-CM | POA: Diagnosis not present

## 2024-03-13 LAB — CUP PACEART REMOTE DEVICE CHECK
Battery Remaining Longevity: 102 mo
Battery Remaining Percentage: 73 %
Battery Voltage: 3.02 V
Brady Statistic RV Percent Paced: 1 %
Date Time Interrogation Session: 20250414020015
Implantable Lead Connection Status: 753985
Implantable Lead Connection Status: 753985
Implantable Lead Implant Date: 20210415
Implantable Lead Implant Date: 20210415
Implantable Lead Location: 753859
Implantable Lead Location: 753860
Implantable Pulse Generator Implant Date: 20210415
Lead Channel Impedance Value: 440 Ohm
Lead Channel Pacing Threshold Amplitude: 0.75 V
Lead Channel Pacing Threshold Pulse Width: 0.5 ms
Lead Channel Sensing Intrinsic Amplitude: 6 mV
Lead Channel Setting Pacing Amplitude: 2.5 V
Lead Channel Setting Pacing Pulse Width: 0.5 ms
Lead Channel Setting Sensing Sensitivity: 2 mV
Pulse Gen Model: 2272
Pulse Gen Serial Number: 3813712

## 2024-03-14 ENCOUNTER — Encounter: Payer: Self-pay | Admitting: Internal Medicine

## 2024-04-04 LAB — LAB REPORT - SCANNED
Creatinine, POC: 215.9 mg/dL
EGFR: 46

## 2024-04-12 ENCOUNTER — Other Ambulatory Visit: Payer: Self-pay

## 2024-04-12 DIAGNOSIS — I7143 Infrarenal abdominal aortic aneurysm, without rupture: Secondary | ICD-10-CM

## 2024-04-12 DIAGNOSIS — I6523 Occlusion and stenosis of bilateral carotid arteries: Secondary | ICD-10-CM

## 2024-04-19 ENCOUNTER — Telehealth: Payer: Self-pay

## 2024-04-19 NOTE — Telephone Encounter (Signed)
 Cancelled 6/10 [facility] appt// called pt no VM set up// no new shoe orders

## 2024-04-20 NOTE — Progress Notes (Addendum)
 Patient ID: Thomas Marshall, male   DOB: [birth year], 83 y.o.   MRN: [id number]  Reason for Consult: Follow-up and Carotid   Referred by [name], MD  Subjective:  HPI Thomas Marshall is a 83 y.o. male presenting for follow-up of AAA and carotid artery disease.  He underwent bilateral CEAs for asymptomatic disease and continues to be asymptomatic.  Specifically he denies any one-sided weakness, numbness, amaurosis or speech issues.  He is also in annual surveillance for his AAA.  Last year measured 4.0 cm in maximal diameter.  He denies any new or unusual abdominal or back pain. He is on Eliquis  for A-fib and is compliant with aspirin  and statin.  Past Medical History:  Diagnosis Date   AAA (abdominal aortic aneurysm) (HCC)    Arthritis    Cancer (HCC)    skin & squamous cell   Carotid artery disease (HCC)    a. s/p R CEA 12/2015.   CKD (chronic kidney disease), stage III (HCC)    a. per historical labs.   Coronary artery disease    a. s/p CABG 2004.   Diabetes mellitus without complication (HCC)    GERD (gastroesophageal reflux disease)    Hyperlipidemia    Hypertension    Medication intolerance Multiple    PAF (paroxysmal atrial fibrillation) (HCC)    Pneumonia    Presence of permanent cardiac pacemaker    Sinus bradycardia    a. baseline HR 50s-60s, also h/o bradycardia on metoprolol  and carvedilol    Family History  Problem Relation Age of Onset   Stroke Mother    Heart disease Mother    Heart disease Father    Hypertension Father    Hyperlipidemia Father    Cancer Sister        breast  and skin   Diabetes Sister    Hypertension Sister    Diabetes Brother    Heart disease Brother    Hyperlipidemia Brother    Hypertension Brother    Early death Neg Hx    Past Surgical History:  Procedure Laterality Date   ANKLE SURGERY     right fused   BACK SURGERY     low back    CARDIOVERSION N/A 02/14/2020   Procedure: CARDIOVERSION;  Surgeon: Harrold Lincoln, MD;   Location: [facility] ENDOSCOPY;  Service: Cardiovascular;  Laterality: N/A;   CARDIOVERSION N/A 02/26/2020   Procedure: CARDIOVERSION;  Surgeon: Hazle Lites, MD;  Location: [facility] ENDOSCOPY;  Service: Cardiovascular;  Laterality: N/A;   CARDIOVERSION N/A 04/12/2020   Procedure: CARDIOVERSION;  Surgeon: Maudine Sos, MD;  Location: [facility] ENDOSCOPY;  Service: Cardiovascular;  Laterality: N/A;   CHOLECYSTECTOMY N/A 12/07/2013   Procedure: LAPAROSCOPIC CHOLECYSTECTOMY ;  Surgeon: Enid Harry, MD;  Location: [facility] OR;  Service: General;  Laterality: N/A;   COLONOSCOPY     CORONARY ARTERY BYPASS GRAFT  2004   [place]   CORONARY STENT INTERVENTION N/A 05/21/2017   Procedure: Coronary Stent Intervention;  Surgeon: Arnoldo Lapping, MD;  Location: [facility] INVASIVE CV LAB;  Service: Cardiovascular;  Laterality: N/A;   ENDARTERECTOMY Right 12/10/2015   Procedure: Right Carotid ENDARTERECTOMY;  Surgeon: Arvil Lauber, MD;  Location: [facility] OR;  Service: Vascular;  Laterality: Right;   ENDARTERECTOMY Left 12/30/2021   Procedure: LEFT CAROTID ENDARTERECTOMY;  Surgeon: Dannis Dy, MD;  Location: [facility] OR;  Service: Vascular;  Laterality: Left;   EXPLORATION POST OPERATIVE OPEN HEART     FINGER SURGERY     skin  graft on rt index   FINGER SURGERY Right    right index finger.   HERNIA REPAIR Right    RIH   JOINT REPLACEMENT     KNEE SURGERY     replacement on both knees   LEFT HEART CATH AND CORS/GRAFTS ANGIOGRAPHY N/A 05/21/2017   Procedure: Left Heart Cath and Cors/Grafts Angiography;  Surgeon: Arnoldo Lapping, MD;  Location: [facility] INVASIVE CV LAB;  Service: Cardiovascular;  Laterality: N/A;   LEFT HEART CATH AND CORS/GRAFTS ANGIOGRAPHY N/A 05/20/2020   Procedure: LEFT HEART CATH AND CORS/GRAFTS ANGIOGRAPHY;  Surgeon: Arnoldo Lapping, MD;  Location: [facility] INVASIVE CV LAB;  Service: Cardiovascular;  Laterality: N/A;   PACEMAKER IMPLANT N/A 03/14/2020   Procedure: PACEMAKER IMPLANT;  Surgeon: Tammie Fall, MD;   Location: [facility] INVASIVE CV LAB;  Service: Cardiovascular;  Laterality: N/A;   PROSTATE SURGERY     PVC ABLATION N/A 10/27/2017   Procedure: PVC ABLATION;  Surgeon: Tammie Fall, MD;  Location: [facility] INVASIVE CV LAB;  Service: Cardiovascular;  Laterality: N/A;   TONSILLECTOMY     URINARY SURGERY     scar tissue    Short Social History:  Social History   Tobacco Use   Smoking status: Former    Current packs/day: 0.00    Average packs/day: 1 pack/day for 13.0 years (13.0 ttl pk-yrs)    Types: Cigarettes    Start date: 11/30/1957    Quit date: 11/30/1970    Years since quitting: 53.4    Passive exposure: Never   Smokeless tobacco: Never  Substance Use Topics   Alcohol use: No    Alcohol/week: 0.0 standard drinks of alcohol    Allergies  Allergen Reactions   Lisinopril Swelling and Other (See Comments)    Angioedema.    Losartan Swelling and Other (See Comments)    Lips swell Angioedema    Nifedipine Swelling and Other (See Comments)    Sugar increase   Triamterene Swelling and Other (See Comments)    Face swells, no breathing impairment   Hydralazine  Hcl Other (See Comments)    Fatigue; poor appetite   Losartan Potassium-Hctz Swelling and Other (See Comments)   Amoxicillin Rash and Other (See Comments)    Did it involve swelling of the face/tongue/throat, SOB, or low BP? No Did it involve sudden or severe rash/hives, skin peeling, or any reaction on the inside of your mouth or nose? No Did you need to seek medical attention at a hospital or doctor's office? No When did it last happen?      10 + years If all above answers are "NO", may proceed with cephalosporin use.     Ampicillin Rash and Other (See Comments)    Did it involve swelling of the face/tongue/throat, SOB, or low BP? No Did it involve sudden or severe rash/hives, skin peeling, or any reaction on the inside of your mouth or nose? No Did you need to seek medical attention at a hospital or doctor's office? No When  did it last happen?      10 + years If all above answers are "NO", may proceed with cephalosporin use.    Carvedilol  Other (See Comments)    Low heart rate (in higher doses)    Current Outpatient Medications  Medication Sig Dispense Refill   Accu-Chek Softclix Lancets lancets USE 1  TO CHECK GLUCOSE ONCE DAILY AS DIRECTED 100 each 0   apixaban  (ELIQUIS ) 2.5 MG TABS tablet Take 1 tablet (2.5 mg total) by mouth 2 (  two) times daily. (0800 & 2000) 180 tablet 3   aspirin  EC 81 MG tablet Take 1 tablet (81 mg total) by mouth daily. 90 tablet 3   atorvastatin  (LIPITOR) 40 MG tablet Take 40 mg by mouth at bedtime.      Blood Glucose Calibration (ACCU-CHEK GUIDE CONTROL) LIQD Use as directed once daily as needed E11.9 1 each 1   Blood Glucose Monitoring Suppl (ACCU-CHEK GUIDE ME) w/Device KIT Use as directed four times per day E11.9 1 kit 0   carvedilol  (COREG ) 3.125 MG tablet Take 1 tablet (3.125 mg total) by mouth daily at 12 noon. 30 tablet 6   cholecalciferol  (VITAMIN D3) 25 MCG (1000 UNIT) tablet Take 1,000 Units by mouth every evening.     glucose blood (ACCU-CHEK GUIDE TEST) test strip use as directed once daily E11.9 100 each 12   hydrALAZINE  (APRESOLINE ) 10 MG tablet Take 10 mg by mouth 3 (three) times daily as needed (elevated bp greater than 160).     magnesium  oxide (MAG-OX) 400 MG tablet Take 800 mg by mouth 2 (two) times daily.      meclizine  (ANTIVERT ) 25 MG tablet Take 1 tablet (25 mg total) by mouth 3 (three) times daily as needed for dizziness. 30 tablet 0   Multiple Vitamins-Minerals (MENS 50+ MULTI VITAMIN/MIN PO) Take by mouth daily at 6 (six) AM.     mupirocin  ointment (BACTROBAN ) 2 % Apply 1 Application topically 2 (two) times daily. use pea sized amount on end of pinkie finger and put just on inside of nostril and pinch 22 g 0   nitroGLYCERIN  (NITROSTAT ) 0.4 MG SL tablet Place 1 tablet (0.4 mg total) under the tongue every 5 (five) minutes as needed for chest pain (x 3 doses). 30  tablet 3   OMEPRAZOLE PO Take 1 capsule by mouth daily.     terazosin  (HYTRIN ) 1 MG capsule Take 1 mg by mouth at bedtime.  30 capsule 11   vitamin C  (ASCORBIC ACID ) 500 MG tablet Take 500 mg by mouth daily with breakfast.      Zinc 22.5 MG TABS Take 22.5 mg by mouth 2 (two) times a week.     pantoprazole  (PROTONIX ) 40 MG tablet Take 1 tablet (40 mg total) by mouth daily. 90 tablet 3   No current facility-administered medications for this visit.    REVIEW OF SYSTEMS  All other systems reviewed and are negative  Objective:  Objective   Vitals:   04/21/24 0830 04/21/24 0834  BP: (!) 158/92 (!) 148/84  Pulse: (!) 55   Resp: 18   Temp: 98 F (36.7 C)   TempSrc: Temporal   SpO2: 97%   Weight: 207 lb 9.6 oz (94.2 kg)   Height: 6\' 2"  (1.88 m)    Body mass index is 26.65 kg/m.  Physical Exam General: no acute distress Cardiac: hemodynamically stable Pulm: normal work of breathing Abdomen: non-tender, no pulsatile mass palpated  Neuro: alert, no focal deficit Extremities: no edema, cyanosis or wounds Vascular:   Right: Palpable femoral, palpable DP  Left: Palpable femoral, palpable DP  Data: Carotid duplex Right Carotid Findings:  +----------+--------+--------+--------+------------------+           PSV cm/sEDV cm/sStenosisPlaque Description  +----------+--------+--------+--------+------------------+  CCA Prox  90      14              heterogenous        +----------+--------+--------+--------+------------------+  CCA Distal107     15  heterogenous        +----------+--------+--------+--------+------------------+  ICA Prox  43      8       1-39%   heterogenous        +----------+--------+--------+--------+------------------+  ICA Mid   75      18                                  +----------+--------+--------+--------+------------------+  ICA Distal95      29                                   +----------+--------+--------+--------+------------------+  ECA      35      0                                   +----------+--------+--------+--------+------------------+   +----------+--------+-------+---------+           PSV cm/sEDV cmsDescribe   +----------+--------+-------+---------+  Subclavian              Turbulent  +----------+--------+-------+---------+   +---------+--------+--+--------+--+---------+  VertebralPSV cm/s60EDV cm/s14Antegrade  +---------+--------+--+--------+--+---------+    Left Carotid Findings:  +----------+--------+--------+--------+------------------+           PSV cm/sEDV cm/sStenosisPlaque Description  +----------+--------+--------+--------+------------------+  CCA Prox  110     15              heterogenous        +----------+--------+--------+--------+------------------+  CCA Distal42      6               heterogenous        +----------+--------+--------+--------+------------------+  ICA Prox  41      11      1-39%   heterogenous        +----------+--------+--------+--------+------------------+  ICA Mid   69      22                                  +----------+--------+--------+--------+------------------+  ICA Distal89      34                                  +----------+--------+--------+--------+------------------+  ECA      44      1                                   +----------+--------+--------+--------+------------------+   +----------+--------+--------+----------------+           PSV cm/sEDV cm/sDescribe          +----------+--------+--------+----------------+  Subclavian78     0       Multiphasic, WNL  +----------+--------+--------+----------------+   +---------+--------+--+--------+-+---------+  VertebralPSV cm/s50EDV cm/s9Antegrade  +---------+--------+--+--------+-+---------+    AAA  duplex -----------+-------+----------+----------+---------------------+  Location  AP (cm)Trans (cm)PSV (cm/s)Comments               +-----------+-------+----------+----------+---------------------+  Proximal  2.57   2.55      42                               +-----------+-------+----------+----------+---------------------+  Mid       3.24   4.04      34        Prior: 3.43 x 4.05 cm  +-----------+-------+----------+----------+---------------------+  Distal    2.29   2.29      50                               +-----------+-------+----------+----------+---------------------+  RT CIA Prox1.3    1.4       78                               +-----------+-------+----------+----------+---------------------+  LT CIA Prox1.4    1.3       43                               +-----------+-------+----------+----------+---------------------+   BMP reviewed, creatinine 1.5     Assessment/Plan:     Thomas Marshall is a 83 y.o. male with with a history of AAA and carotid artery stenosis s/p bilateral CEA's.  He has no new neurological symptoms and a carotid duplex continues to demonstrate widely patent endarterectomy sites without restenosis.Thomas Marshall  His AAA duplex demonstrates 4.0 x 3.2 which is not significantly increased from last year when it was 4.0 x 3.4.  Plan to continue with annual surveillance.  Follow-up in 1 year with repeat AAA duplex and carotid duplex.     Philipp Brawn MD [facility]

## 2024-04-21 ENCOUNTER — Ambulatory Visit (HOSPITAL_COMMUNITY)
Admission: RE | Admit: 2024-04-21 | Discharge: 2024-04-21 | Disposition: A | Discharge status: Home / Self Care | Source: Ambulatory Visit | Attending: Vascular Surgery | Admitting: Vascular Surgery

## 2024-04-21 ENCOUNTER — Ambulatory Visit (HOSPITAL_BASED_OUTPATIENT_CLINIC_OR_DEPARTMENT_OTHER)
Admission: RE | Admit: 2024-04-21 | Discharge: 2024-04-21 | Disposition: A | Discharge status: Home / Self Care | Source: Ambulatory Visit | Attending: Vascular Surgery | Admitting: Vascular Surgery

## 2024-04-21 ENCOUNTER — Ambulatory Visit (INDEPENDENT_AMBULATORY_CARE_PROVIDER_SITE_OTHER): Admitting: Vascular Surgery

## 2024-04-21 ENCOUNTER — Encounter: Payer: Self-pay | Admitting: Vascular Surgery

## 2024-04-21 VITALS — BP 148/84 | HR 55 | Temp 98.0°F | Resp 18 | Ht 74.0 in | Wt 207.6 lb

## 2024-04-21 DIAGNOSIS — I6523 Occlusion and stenosis of bilateral carotid arteries: Secondary | ICD-10-CM | POA: Diagnosis not present

## 2024-04-21 DIAGNOSIS — I7143 Infrarenal abdominal aortic aneurysm, without rupture: Secondary | ICD-10-CM

## 2024-04-27 NOTE — Progress Notes (Signed)
 Remote pacemaker transmission.

## 2024-05-09 ENCOUNTER — Ambulatory Visit (INDEPENDENT_AMBULATORY_CARE_PROVIDER_SITE_OTHER): Admitting: Podiatry

## 2024-05-09 ENCOUNTER — Other Ambulatory Visit

## 2024-05-09 DIAGNOSIS — E1140 Type 2 diabetes mellitus with diabetic neuropathy, unspecified: Secondary | ICD-10-CM

## 2024-05-09 DIAGNOSIS — M79675 Pain in left toe(s): Secondary | ICD-10-CM

## 2024-05-09 DIAGNOSIS — L84 Corns and callosities: Secondary | ICD-10-CM

## 2024-05-09 DIAGNOSIS — M79674 Pain in right toe(s): Secondary | ICD-10-CM | POA: Diagnosis not present

## 2024-05-09 DIAGNOSIS — Z7901 Long term (current) use of anticoagulants: Secondary | ICD-10-CM

## 2024-05-09 DIAGNOSIS — B351 Tinea unguium: Secondary | ICD-10-CM

## 2024-05-09 DIAGNOSIS — M216X1 Other acquired deformities of right foot: Secondary | ICD-10-CM

## 2024-05-09 DIAGNOSIS — M2042 Other hammer toe(s) (acquired), left foot: Secondary | ICD-10-CM

## 2024-05-09 DIAGNOSIS — M216X2 Other acquired deformities of left foot: Secondary | ICD-10-CM

## 2024-05-09 DIAGNOSIS — M2041 Other hammer toe(s) (acquired), right foot: Secondary | ICD-10-CM

## 2024-05-09 NOTE — Progress Notes (Signed)
 Patient ID: Thomas Marshall, male   DOB: Apr 05, 1941, 83 y.o.   MRN: [id number]  Subjective: Chief Complaint  Patient presents with   Nail Problem    RM 12 Patient is here for routine foot care, nail trimming and callus on right foot. Interested in orthotic shoes.     83 y.o. returns the office today for painful, elongated, thickened toenails which he cannot trim himself and for calluses on the right foot. He would like to get new diabetic shoes. The calluses on the right foot are getting thick and cause pain. No open lesions.   He is on Eliquis   Last A1c was 7.1 on 11/08/2024  Thomas Coombe, MD Last seen 03/03/2024   Objective: AAO 3, NAD DP/PT pulses palpable, CRT less than 3 seconds Protective sensation decreased with Thomas Marshall monofilament Nails hypertrophic, dystrophic, elongated, brittle, discolored 10. There is tenderness overlying the nails 1-5 bilaterally. There is no surrounding erythema or drainage along the nail sites. There is hyperkeratotic lesion right fifth metatarsal base and fifth metatarsal head. No ulceration, drainage or any signs of infection.  No pain with calf compression, swelling, warmth, erythema.  Assessment: Symptomatic onychomycosis; hyperkeratotic tissue  Plan: -Treatment options including alternatives, risks, complications were discussed -Nails sharply debrided 10 without complication/bleeding. -Sharply debrided the hyperkeratotic lesions x 2 right foot any complications or bleeding. -Order for diabetic shoes to be sent to [place].  Given the calluses he is at high risk of ulcerations and he has done well with them previously. -Discussed daily foot inspection. -Follow-up in 3 months or sooner if any problems are to arise. In the meantime, encouraged to call the office with any questions, concerns, changes symptoms.  Return in about 3 months (around 08/09/2024) for nail/callus trim.   Thomas Marshall, DPM

## 2024-05-11 ENCOUNTER — Ambulatory Visit: Admitting: Podiatry

## 2024-05-12 ENCOUNTER — Other Ambulatory Visit: Payer: Self-pay | Admitting: Internal Medicine

## 2024-05-12 DIAGNOSIS — E1122 Type 2 diabetes mellitus with diabetic chronic kidney disease: Secondary | ICD-10-CM

## 2024-06-12 ENCOUNTER — Ambulatory Visit: Payer: Medicare Other

## 2024-06-12 ENCOUNTER — Ambulatory Visit: Payer: Self-pay | Admitting: Internal Medicine

## 2024-06-12 DIAGNOSIS — I495 Sick sinus syndrome: Secondary | ICD-10-CM

## 2024-06-12 LAB — CUP PACEART REMOTE DEVICE CHECK
Battery Remaining Longevity: 100 mo
Battery Remaining Percentage: 71 %
Battery Voltage: 3.02 V
Brady Statistic RV Percent Paced: 1 %
Date Time Interrogation Session: 20250714020012
Implantable Lead Connection Status: 753985
Implantable Lead Connection Status: 753985
Implantable Lead Implant Date: 20210415
Implantable Lead Implant Date: 20210415
Implantable Lead Location: 753859
Implantable Lead Location: 753860
Implantable Pulse Generator Implant Date: 20210415
Lead Channel Impedance Value: 440 Ohm
Lead Channel Pacing Threshold Amplitude: 0.75 V
Lead Channel Pacing Threshold Pulse Width: 0.5 ms
Lead Channel Sensing Intrinsic Amplitude: 6.7 mV
Lead Channel Setting Pacing Amplitude: 2.5 V
Lead Channel Setting Pacing Pulse Width: 0.5 ms
Lead Channel Setting Sensing Sensitivity: 2 mV
Pulse Gen Model: 2272
Pulse Gen Serial Number: 3813712

## 2024-07-03 ENCOUNTER — Other Ambulatory Visit: Payer: Self-pay | Admitting: Internal Medicine

## 2024-07-03 DIAGNOSIS — E1122 Type 2 diabetes mellitus with diabetic chronic kidney disease: Secondary | ICD-10-CM

## 2024-08-08 LAB — LAB REPORT - SCANNED: EGFR: 43

## 2024-08-15 ENCOUNTER — Ambulatory Visit (INDEPENDENT_AMBULATORY_CARE_PROVIDER_SITE_OTHER): Admitting: Podiatry

## 2024-08-15 DIAGNOSIS — M79674 Pain in right toe(s): Secondary | ICD-10-CM | POA: Diagnosis not present

## 2024-08-15 DIAGNOSIS — B351 Tinea unguium: Secondary | ICD-10-CM

## 2024-08-15 DIAGNOSIS — M79675 Pain in left toe(s): Secondary | ICD-10-CM | POA: Diagnosis not present

## 2024-08-15 DIAGNOSIS — L84 Corns and callosities: Secondary | ICD-10-CM | POA: Diagnosis not present

## 2024-08-15 DIAGNOSIS — E1140 Type 2 diabetes mellitus with diabetic neuropathy, unspecified: Secondary | ICD-10-CM

## 2024-08-15 NOTE — Progress Notes (Signed)
 Patient ID: Thomas Marshall, male   DOB: August 15, 1941, 83 y.o.   MRN: [id number]  Subjective: Chief Complaint  Patient presents with   dfc    Rm11 diabetic foot care / A1c 7.1    83 y.o. returns the office today for painful, elongated, thickened toenails which he cannot trim himself and for calluses on the right foot. He is awaiting to evaluate shoes.  The calluses on the right foot are getting thick and cause pain. No open lesions.   He is on Eliquis   Last A1c was 7.1 on 11/08/2024  Norleen Lynwood ORN, MD Last seen 03/03/2024   Objective: AAO 3, NAD DP/PT pulses palpable, CRT less than 3 seconds Protective sensation decreased with [name] Nails hypertrophic, dystrophic, elongated, brittle, discolored 10. There is tenderness overlying the nails 1-5 bilaterally. There is no surrounding erythema or drainage along the nail sites. There is hyperkeratotic lesion right fifth metatarsal base and fifth metatarsal head. No ulceration, drainage or any signs of infection.  No pain with calf compression, swelling, warmth, erythema.  Assessment: Symptomatic onychomycosis; hyperkeratotic tissue  Plan: -Treatment options including alternatives, risks, complications were discussed -Nails sharply debrided 10 without complication/bleeding. -Sharply debrided the hyperkeratotic lesions x 2 right foot any complications or bleeding. -Discussed daily foot inspection. -Follow-up in 3 months or sooner if any problems are to arise. In the meantime, encouraged to call the office with any questions, concerns, changes symptoms.  Return in about 3 months (around 11/14/2024) for nail/callus trim.   Donnice Fees, DPM

## 2024-08-22 ENCOUNTER — Ambulatory Visit (INDEPENDENT_AMBULATORY_CARE_PROVIDER_SITE_OTHER)

## 2024-08-22 DIAGNOSIS — Z23 Encounter for immunization: Secondary | ICD-10-CM

## 2024-08-22 NOTE — Progress Notes (Signed)
 Pt here today for high-dose flu vaccine. Vaccine administered. Pt tolerated well.

## 2024-09-04 ENCOUNTER — Ambulatory Visit: Admitting: Internal Medicine

## 2024-09-07 ENCOUNTER — Ambulatory Visit (INDEPENDENT_AMBULATORY_CARE_PROVIDER_SITE_OTHER)

## 2024-09-07 VITALS — Ht 74.0 in | Wt 206.0 lb

## 2024-09-07 DIAGNOSIS — Z0000 Encounter for general adult medical examination without abnormal findings: Secondary | ICD-10-CM

## 2024-09-07 NOTE — Progress Notes (Signed)
 Subjective:  Please attest and cosign this visit due to patients primary care provider not being in the office at the time the visit was completed.  (Pt of Dr Lynwood Rush)   Thomas Marshall is a 83 y.o. who presents for a Medicare Wellness preventive visit.  As a reminder, Annual Wellness Visits don't include a physical exam, and some assessments may be limited, especially if this visit is performed virtually. We may recommend an in-person follow-up visit with your provider if needed.  Visit Complete: Virtual I connected with  Thomas Marshall on 09/07/24 by a audio enabled telemedicine application and verified that I am speaking with the correct person using two identifiers.  Patient Location: Home  Provider Location: Office/Clinic  I discussed the limitations of evaluation and management by telemedicine. The patient expressed understanding and agreed to proceed.  Vital Signs: Because this visit was a virtual/telehealth visit, some criteria may be missing or patient reported. Any vitals not documented were not able to be obtained and vitals that have been documented are patient reported.  VideoDeclined- This patient declined Librarian, academic. Therefore the visit was completed with audio only.  Persons Participating in Visit: Patient.  AWV Questionnaire: No: Patient Medicare AWV questionnaire was not completed prior to this visit.  Cardiac Risk Factors include: advanced age (>72men, >55 women);diabetes mellitus;dyslipidemia;male gender;hypertension     Objective:    Today's Vitals   09/07/24 1018  Weight: 206 lb (93.4 kg)  Height: 6' 2 (1.88 m)   Body mass index is 26.45 kg/m.     09/07/2024   10:18 AM 08/30/2023   10:39 AM 07/15/2023   12:52 PM 08/12/2022    4:37 PM 12/26/2021   11:04 AM 05/20/2020    8:01 AM 04/11/2020    2:44 PM  Advanced Directives  Does Patient Have a Medical Advance Directive? Yes Yes Yes Yes Yes Yes Yes  Type of Sports coach of [place];Living will Healthcare Power of [place];Living will Healthcare Power of [place];Living will Healthcare Power of [place];Living will Healthcare Power of [place];Living will Healthcare Power of [place];Living will Healthcare Power of [place];Living will  Does patient want to make changes to medical advance directive?      No - Patient declined   Copy of Healthcare Power of Attorney in Chart? No - copy requested No - copy requested  No - copy requested No - copy requested No - copy requested Yes - validated most recent copy scanned in chart (See row information)    Current Medications (verified) Outpatient Encounter Medications as of 09/07/2024  Medication Sig   Accu-Chek Softclix Lancets lancets USE 1 CLEAN LANCET TO CHECK GLUCOSE ONCE DAILY AS DIRECTED   apixaban  (ELIQUIS ) 2.5 MG TABS tablet Take 1 tablet (2.5 mg total) by mouth 2 (two) times daily. (0800 & 2000)   aspirin  EC 81 MG tablet Take 1 tablet (81 mg total) by mouth daily.   atorvastatin  (LIPITOR) 40 MG tablet Take 40 mg by mouth at bedtime.    Blood Glucose Calibration (ACCU-CHEK GUIDE CONTROL) LIQD Use as directed once daily as needed E11.9   Blood Glucose Monitoring Suppl (ACCU-CHEK GUIDE ME) w/Device KIT Use as directed four times per day E11.9   carvedilol  (COREG ) 3.125 MG tablet Take 1 tablet (3.125 mg total) by mouth daily at 12 noon.   cholecalciferol  (VITAMIN D3) 25 MCG (1000 UNIT) tablet Take 1,000 Units by mouth every evening.   glucose blood (ACCU-CHEK GUIDE TEST) test strip use  as directed once daily E11.9   hydrALAZINE  (APRESOLINE ) 10 MG tablet Take 10 mg by mouth 3 (three) times daily as needed (elevated bp greater than 160).   magnesium  oxide (MAG-OX) 400 MG tablet Take 800 mg by mouth 2 (two) times daily.    meclizine  (ANTIVERT ) 25 MG tablet Take 1 tablet (25 mg total) by mouth 3 (three) times daily as needed for dizziness.   Multiple Vitamins-Minerals (MENS 50+ MULTI  VITAMIN/MIN PO) Take by mouth daily at 6 (six) AM.   mupirocin  ointment (BACTROBAN ) 2 % Apply 1 Application topically 2 (two) times daily. use pea sized amount on end of pinkie finger and put just on inside of nostril and pinch   nitroGLYCERIN  (NITROSTAT ) 0.4 MG SL tablet Place 1 tablet (0.4 mg total) under the tongue every 5 (five) minutes as needed for chest pain (x 3 doses).   OMEPRAZOLE PO Take 1 capsule by mouth daily.   terazosin  (HYTRIN ) 1 MG capsule Take 1 mg by mouth at bedtime.    vitamin C  (ASCORBIC ACID ) 500 MG tablet Take 500 mg by mouth daily with breakfast.    Zinc 22.5 MG TABS Take 22.5 mg by mouth 2 (two) times a week.   pantoprazole  (PROTONIX ) 40 MG tablet Take 1 tablet (40 mg total) by mouth daily. (Patient not taking: Reported on 09/07/2024)   No facility-administered encounter medications on file as of 09/07/2024.    Allergies (verified) Lisinopril, Losartan, Nifedipine, Triamterene, Hydralazine  hcl, Losartan potassium-hctz, Amoxicillin, Ampicillin, and Carvedilol    History: Past Medical History:  Diagnosis Date   AAA (abdominal aortic aneurysm)    Arthritis    Cancer (HCC)    skin & squamous cell   Carotid artery disease    a. s/p R CEA 12/2015.   CKD (chronic kidney disease), stage III (HCC)    a. per historical labs.   Coronary artery disease    a. s/p CABG 2004.   Diabetes mellitus without complication (HCC)    GERD (gastroesophageal reflux disease)    Hyperlipidemia    Hypertension    Medication intolerance Multiple    PAF (paroxysmal atrial fibrillation) (HCC)    Pneumonia    Presence of permanent cardiac pacemaker    Sinus bradycardia    a. baseline HR 50s-60s, also h/o bradycardia on metoprolol  and carvedilol    Past Surgical History:  Procedure Laterality Date   ANKLE SURGERY     right fused   BACK SURGERY     low back    CARDIOVERSION N/A 02/14/2020   Procedure: CARDIOVERSION;  Surgeon: Barbaraann Darryle Ned, MD;  Location: [facility] ENDOSCOPY;   Service: Cardiovascular;  Laterality: N/A;   CARDIOVERSION N/A 02/26/2020   Procedure: CARDIOVERSION;  Surgeon: Mona Vinie BROCKS, MD;  Location: [facility] ENDOSCOPY;  Service: Cardiovascular;  Laterality: N/A;   CARDIOVERSION N/A 04/12/2020   Procedure: CARDIOVERSION;  Surgeon: Raford Riggs, MD;  Location: [facility] ENDOSCOPY;  Service: Cardiovascular;  Laterality: N/A;   CHOLECYSTECTOMY N/A 12/07/2013   Procedure: LAPAROSCOPIC CHOLECYSTECTOMY ;  Surgeon: Donnice Bury, MD;  Location: [facility] OR;  Service: General;  Laterality: N/A;   COLONOSCOPY     CORONARY ARTERY BYPASS GRAFT  2004   [place]   CORONARY STENT INTERVENTION N/A 05/21/2017   Procedure: Coronary Stent Intervention;  Surgeon: Wonda Sharper, MD;  Location: [facility] INVASIVE CV LAB;  Service: Cardiovascular;  Laterality: N/A;   ENDARTERECTOMY Right 12/10/2015   Procedure: Right Carotid ENDARTERECTOMY;  Surgeon: Redell LITTIE Door, MD;  Location: [facility] OR;  Service: Vascular;  Laterality: Right;   ENDARTERECTOMY Left 12/30/2021   Procedure: LEFT CAROTID ENDARTERECTOMY;  Surgeon: Eliza Lonni RAMAN, MD;  Location: [facility] OR;  Service: Vascular;  Laterality: Left;   EXPLORATION POST OPERATIVE OPEN HEART     FINGER SURGERY     skin graft on rt index   FINGER SURGERY Right    right index finger.   HERNIA REPAIR Right    RIH   JOINT REPLACEMENT     KNEE SURGERY     replacement on both knees   LEFT HEART CATH AND CORS/GRAFTS ANGIOGRAPHY N/A 05/21/2017   Procedure: Left Heart Cath and Cors/Grafts Angiography;  Surgeon: Wonda Sharper, MD;  Location[facility] INVAS[facility] CV LAB;  Service: Cardiovascular;  Laterality: N/A;   LEFT HEART CATH AND CORS/GRAFTS ANGIOGRAPHY N/A 05/20/2020   Procedure: LEFT HEART CATH AND CORS/GRAFTS ANGIOGRAPHY;  Surgeon: Wonda Sharper, MD;  Location[facility] INVASIVE CV LAB;  Service: Cardiovascular;  Laterality: N/A;   PACEMAKER IMPLANT N/A 03/14/2020   Procedure: PACEMAKER IMPLANT;  Surgeon: Waddell Danelle ORN, MD;  Location: [facility] INVASIVE CV LAB;   Service: Cardiovascular;  Laterality: N/A;   PROSTATE SURGERY     PVC ABLATION N/A 10/27/2017   Procedure: PVC ABLATION;  Surgeon: Waddell Danelle ORN, MD;  Location: [facility] INVASIVE CV LAB;  Service: Cardiovascular;  Laterality: N/A;   TONSILLECTOMY     URINARY SURGERY     scar tissue   Family History  Problem Relation Age of Onset   Stroke Mother    Heart disease Mother    Heart disease Father    Hypertension Father    Hyperlipidemia Father    Cancer Sister        breast  and skin   Diabetes Sister    Hypertension Sister    Diabetes Brother    Heart disease Brother    Hyperlipidemia Brother    Hypertension Brother    Early death Neg Hx    Social History   Socioeconomic History   Marital status: Married    Spouse name: Rollene   Number of children: 2   Years of education: Not on file   Highest education level: Not on file  Occupational History   Occupation: retired  Tobacco Use   Smoking status: Former    Current packs/day: 0.00    Average packs/day: 1 pack/day for 13.0 years (13.0 ttl pk-yrs)    Types: Cigarettes    Start date: 11/30/1957    Quit date: 11/30/1970    Years since quitting: 53.8    Passive exposure: Never   Smokeless tobacco: Never  Vaping Use   Vaping status: Never Used  Substance and Sexual Activity   Alcohol use: No    Alcohol/week: 0.0 standard drinks of alcohol   Drug use: No   Sexual activity: Yes  Other Topics Concern   Not on file  Social History Narrative   Has 3 dogs.  Lives with wife.   Social Drivers of Corporate investment banker Strain: Low Risk  (09/07/2024)   Overall Financial Resource Strain (CARDIA)    Difficulty of Paying Living Expenses: Not hard at all  Food Insecurity: No Food Insecurity (09/07/2024)   Hunger Vital Sign    Worried About Running Out of Food in the Last Year: Never true    Ran Out of Food in the Last Year: Never true  Transportation Needs: No Transportation Needs (09/07/2024)   PRAPARE - Doctor, general practice (Medical): No    Lack  of Transportation (Non-Medical): No  Physical Activity: Inactive (09/07/2024)   Exercise Vital Sign    Days of Exercise per Week: 0 days    Minutes of Exercise per Session: 0 min  Stress: No Stress Concern Present (09/07/2024)   [employer] of Occupational Health - Occupational Stress Questionnaire    Feeling of Stress: Not at all  Social Connections: Socially Integrated (09/07/2024)   Social Connection and Isolation Panel    Frequency of Communication with Friends and Family: More than three times a week    Frequency of Social Gatherings with Friends and Family: More than three times a week    Attends Religious Services: More than 4 times per year    Active Member of [employer] or Organizations: Yes    Attends Banker Meetings: 1 to 4 times per year    Marital Status: Married    Tobacco Counseling Counseling given: Not Answered    Clinical Intake:  Pre-visit preparation completed: Yes  Pain : No/denies pain     BMI - recorded: 26.45 Nutritional Status: BMI 25 -29 Overweight Nutritional Risks: None Diabetes: Yes CBG done?: No Did pt. bring in CBG monitor from home?: No  Lab Results  Component Value Date   HGBA1C 7.1 (H) 11/09/2023   HGBA1C 7.1 (A) 09/03/2023   HGBA1C 7.3 (H) 04/21/2023     How often do you need to have someone help you when you read instructions, pamphlets, or other written materials from your doctor or pharmacy?: 1 - Never  Interpreter Needed?: No  Information entered by :: Verdie Saba, CMA   Activities of Daily Living     09/07/2024   10:21 AM  In your present state of health, do you have any difficulty performing the following activities:  Hearing? 0  Comment wears hearing aids  Vision? 0  Difficulty concentrating or making decisions? 0  Walking or climbing stairs? 0  Dressing or bathing? 0  Doing errands, shopping? 0  Preparing Food and eating ? N  Using the Toilet? N  In the  past six months, have you accidently leaked urine? N  Do you have problems with loss of bowel control? N  Managing your Medications? N  Managing your Finances? N  Housekeeping or managing your Housekeeping? N    Patient Care Team: Norleen Lynwood ORN, MD as PCP - General (Internal Medicine) Waddell Danelle ORN, MD as PCP - Electrophysiology (Cardiology) Wonda Sharper, MD as PCP - Cardiology (Cardiology) Wonda Sharper, MD as Consulting Physician (Cardiology) Ebbie Cough, MD as Consulting Physician (General Surgery) Claudene Arthea HERO, DO as Consulting Physician (Family Medicine) Loreda Hacker, DPM as Consulting Physician (Podiatry) Gershon Cough SAUNDERS, DPM as Consulting Physician (Podiatry)  I have updated your Care Teams any recent Medical Services you may have received from other providers in the past year.     Assessment:   This is a routine wellness examination for Thomas Marshall.  Hearing/Vision screen Hearing Screening - Comments:: Wears hearing aids Vision Screening - Comments:: Wears rx glasses - up to date with routine eye exams with the [facility]   Goals Addressed               This Visit's Progress     Patient Stated (pt-stated)        Patient stated he plans to continue to stay active       Depression Screen     09/07/2024   10:22 AM 03/03/2024    1:05 PM 11/09/2023    1:14  PM 09/03/2023    1:07 PM 08/30/2023   10:45 AM 04/21/2023    1:06 PM 11/26/2022    3:12 PM  PHQ 2/9 Scores  PHQ - 2 Score 0 0 0 0 0 0 0  PHQ- 9 Score 0   0 3  0    Fall Risk     09/07/2024   10:22 AM 03/03/2024    1:09 PM 11/09/2023    1:14 PM 09/03/2023    1:07 PM 08/30/2023   10:40 AM  Fall Risk   Falls in the past year? 0 0 0 0 0  Number falls in past yr: 0 0 0 0 0  Injury with Fall? 0 0 0 0 0  Risk for fall due to : No Fall Risks No Fall Risks No Fall Risks No Fall Risks No Fall Risks  Follow up Falls evaluation completed;Falls prevention discussed Falls evaluation completed Falls  evaluation completed Falls evaluation completed Falls prevention discussed;Falls evaluation completed    MEDICARE RISK AT HOME:  Medicare Risk at Home Any stairs in or around the home?: No If so, are there any without handrails?: No Home free of loose throw rugs in walkways, pet beds, electrical cords, etc?: Yes Adequate lighting in your home to reduce risk of falls?: Yes Life alert?: No Use of a cane, walker or w/c?: No Grab bars in the bathroom?: Yes Shower chair or bench in shower?: Yes Elevated toilet seat or a handicapped toilet?: Yes  TIMED UP AND GO:  Was the test performed?  No  Cognitive Function: 6CIT completed        09/07/2024   10:25 AM 08/30/2023   10:41 AM 08/12/2022    4:41 PM  6CIT Screen  What Year? 0 points 0 points 0 points  What month? 0 points 0 points 0 points  What time? 0 points 0 points 0 points  Count back from 20 0 points 0 points 0 points  Months in reverse 0 points 0 points 0 points  Repeat phrase 0 points 0 points 0 points  Total Score 0 points 0 points 0 points    Immunizations Immunization History  Administered Date(s) Administered   Fluad Quad(high Dose 65+) 07/26/2019, 08/30/2020, 08/09/2021, 08/21/2022   Fluad Trivalent(High Dose 65+) 08/31/2023   INFLUENZA, HIGH DOSE SEASONAL PF 08/17/2013, 09/08/2013, 07/31/2014, 08/31/2015, 07/31/2016, 07/22/2017, 08/30/2017, 08/04/2018, 08/22/2024   Influenza Split 10/13/2011   Influenza,inj,Quad PF,6+ Mos 08/20/2014, 08/13/2015, 07/31/2016   Influenza-Unspecified 09/29/2004, 09/23/2005, 11/30/2006, 07/31/2009, 08/22/2009, 08/23/2010, 08/31/2011, 10/13/2011, 10/28/2011, 08/08/2012, 08/31/2019, 07/31/2020, 11/30/2020, 07/31/2021, 10/31/2023   PFIZER(Purple Top)SARS-COV-2 Vaccination 12/20/2019, 01/08/2020, 11/26/2020   Pneumococcal Conjugate-13 09/10/2014   Pneumococcal Polysaccharide-23 10/30/2006, 05/22/2016   Pneumococcal-Unspecified 12/06/2003, 11/11/2006, 10/30/2008   Tdap 04/03/2014     Screening Tests Health Maintenance  Topic Date Due   Diabetic kidney evaluation - Urine ACR  Never done   Zoster Vaccines- Shingrix (1 of 2) Never done   DTaP/Tdap/Td (2 - Td or Tdap) 04/03/2024   OPHTHALMOLOGY EXAM  02/09/2025   FOOT EXAM  03/03/2025   Diabetic kidney evaluation - eGFR measurement  08/08/2025   Medicare Annual Wellness (AWV)  09/07/2025   Pneumococcal Vaccine: 50+ Years  Completed   Influenza Vaccine  Completed   Meningococcal B Vaccine  Aged Out   COVID-19 Vaccine  Discontinued    Health Maintenance Items Addressed:  I have recommended that this patient have a immunization for Tdap and Shingles but he declines at this time. I have discussed the risks and benefits of  this procedure with him. The patient verbalizes understanding.   Additional Screening:  Vision Screening: Recommended annual ophthalmology exams for early detection of glaucoma and other disorders of the eye. Is the patient up to date with their annual eye exam?  Yes  Who is the provider or what [facility] the [facility] which the patient [facility]  Dental Screening: Recommended annual dental exams for proper oral hygiene  Community Resource Referral / Chronic Care Management: CRR required this visit?  No   CCM required this visit?  No   Plan:    I have personally reviewed and noted the following in the patient's chart:   Medical and social history Use of alcohol, tobacco or illicit drugs  Current medications and supplements including opioid prescriptions. Patient is not currently taking opioid prescriptions. Functional ability and status Nutritional status Physical activity Advanced directives List of other physicians Hospitalizations, surgeries, and ER visits in previous 12 months Vitals Screenings to include cognitive, depression, and falls Referrals and appointments  In addition, I have reviewed and discussed with patient certain preventive  protocols, quality metrics, and best practice recommendations. A written personalized care plan for preventive services as well as general preventive health recommendations were provided to patient.   Verdie CHRISTELLA Saba, CMA   09/07/2024   After Visit Summary: (MyChart) Due to this being a telephonic visit, the after visit summary with patients personalized plan was offered to patient via MyChart   Notes: Nothing significant to report at this time.

## 2024-09-07 NOTE — Progress Notes (Signed)
 Remote PPM Transmission

## 2024-09-07 NOTE — Progress Notes (Signed)
 Subjective:  Please attest and cosign this visit due to patients primary care provider not being in the office at the time the visit was completed.  (Pt of Dr Lynwood Rush)   Thomas Marshall is a 83 y.o. who presents for a Medicare Wellness preventive visit.  As a reminder, Annual Wellness Visits don't include a physical exam, and some assessments may be limited, especially if this visit is performed virtually. We may recommend an in-person follow-up visit with your provider if needed.  Visit Complete: Virtual I connected with  Thomas Marshall on 09/07/24 by a audio enabled telemedicine application and verified that I am speaking with the correct person using two identifiers.  Patient Location: Home  Provider Location: Office/Clinic  I discussed the limitations of evaluation and management by telemedicine. The patient expressed understanding and agreed to proceed.  Vital Signs: Because this visit was a virtual/telehealth visit, some criteria may be missing or patient reported. Any vitals not documented were not able to be obtained and vitals that have been documented are patient reported.  VideoDeclined- This patient declined Librarian, academic. Therefore the visit was completed with audio only.  Persons Participating in Visit: Patient.  AWV Questionnaire: No: Patient Medicare AWV questionnaire was not completed prior to this visit.  Cardiac Risk Factors include: advanced age (>62men, >26 women);diabetes mellitus;dyslipidemia;male gender;hypertension     Objective:    Today's Vitals   09/07/24 1018  Weight: 206 lb (93.4 kg)  Height: 6' 2 (1.88 m)   Body mass index is 26.45 kg/m.     09/07/2024   10:18 AM 08/30/2023   10:39 AM 07/15/2023   12:52 PM 08/12/2022    4:37 PM 12/26/2021   11:04 AM 05/20/2020    8:01 AM 04/11/2020    2:44 PM  Advanced Directives  Does Patient Have a Medical Advance Directive? Yes Yes Yes Yes Yes Yes Yes  Type of Sports coach of [place];Living will Healthcare Power of [place];Living will Healthcare Power of [place];Living will Healthcare Power of [place];Living will Healthcare Power of [place];Living will Healthcare Power of [place];Living will Healthcare Power of [place];Living will  Does patient want to make changes to medical advance directive?      No - Patient declined   Copy of Healthcare Power of Attorney in Chart? No - copy requested No - copy requested  No - copy requested No - copy requested No - copy requested Yes - validated most recent copy scanned in chart (See row information)    Current Medications (verified) Outpatient Encounter Medications as of 09/07/2024  Medication Sig   Accu-Chek Softclix Lancets lancets USE 1 CLEAN LANCET TO CHECK GLUCOSE ONCE DAILY AS DIRECTED   apixaban  (ELIQUIS ) 2.5 MG TABS tablet Take 1 tablet (2.5 mg total) by mouth 2 (two) times daily. (0800 & 2000)   aspirin  EC 81 MG tablet Take 1 tablet (81 mg total) by mouth daily.   atorvastatin  (LIPITOR) 40 MG tablet Take 40 mg by mouth at bedtime.    Blood Glucose Calibration (ACCU-CHEK GUIDE CONTROL) LIQD Use as directed once daily as needed E11.9   Blood Glucose Monitoring Suppl (ACCU-CHEK GUIDE ME) w/Device KIT Use as directed four times per day E11.9   carvedilol  (COREG ) 3.125 MG tablet Take 1 tablet (3.125 mg total) by mouth daily at 12 noon.   cholecalciferol  (VITAMIN D3) 25 MCG (1000 UNIT) tablet Take 1,000 Units by mouth every evening.   glucose blood (ACCU-CHEK GUIDE TEST) test strip use  as directed once daily E11.9   hydrALAZINE  (APRESOLINE ) 10 MG tablet Take 10 mg by mouth 3 (three) times daily as needed (elevated bp greater than 160).   magnesium  oxide (MAG-OX) 400 MG tablet Take 800 mg by mouth 2 (two) times daily.    meclizine  (ANTIVERT ) 25 MG tablet Take 1 tablet (25 mg total) by mouth 3 (three) times daily as needed for dizziness.   Multiple Vitamins-Minerals (MENS 50+ MULTI  VITAMIN/MIN PO) Take by mouth daily at 6 (six) AM.   mupirocin  ointment (BACTROBAN ) 2 % Apply 1 Application topically 2 (two) times daily. use pea sized amount on end of pinkie finger and put just on inside of nostril and pinch   nitroGLYCERIN  (NITROSTAT ) 0.4 MG SL tablet Place 1 tablet (0.4 mg total) under the tongue every 5 (five) minutes as needed for chest pain (x 3 doses).   OMEPRAZOLE PO Take 1 capsule by mouth daily.   terazosin  (HYTRIN ) 1 MG capsule Take 1 mg by mouth at bedtime.    vitamin C  (ASCORBIC ACID ) 500 MG tablet Take 500 mg by mouth daily with breakfast.    Zinc 22.5 MG TABS Take 22.5 mg by mouth 2 (two) times a week.   pantoprazole  (PROTONIX ) 40 MG tablet Take 1 tablet (40 mg total) by mouth daily. (Patient not taking: Reported on 09/07/2024)   No facility-administered encounter medications on file as of 09/07/2024.    Allergies (verified) Lisinopril, Losartan, Nifedipine, Triamterene, Hydralazine  hcl, Losartan potassium-hctz, Amoxicillin, Ampicillin, and Carvedilol    History: Past Medical History:  Diagnosis Date   AAA (abdominal aortic aneurysm)    Arthritis    Cancer (HCC)    skin & squamous cell   Carotid artery disease    a. s/p R CEA 12/2015.   CKD (chronic kidney disease), stage III (HCC)    a. per historical labs.   Coronary artery disease    a. s/p CABG 2004.   Diabetes mellitus without complication (HCC)    GERD (gastroesophageal reflux disease)    Hyperlipidemia    Hypertension    Medication intolerance Multiple    PAF (paroxysmal atrial fibrillation) (HCC)    Pneumonia    Presence of permanent cardiac pacemaker    Sinus bradycardia    a. baseline HR 50s-60s, also h/o bradycardia on metoprolol  and carvedilol    Past Surgical History:  Procedure Laterality Date   ANKLE SURGERY     right fused   BACK SURGERY     low back    CARDIOVERSION N/A 02/14/2020   Procedure: CARDIOVERSION;  Surgeon: Barbaraann Darryle Ned, MD;  Location: [facility] ENDOSCOPY;   Service: Cardiovascular;  Laterality: N/A;   CARDIOVERSION N/A 02/26/2020   Procedure: CARDIOVERSION;  Surgeon: Mona Vinie BROCKS, MD;  Location: [facility] ENDOSCOPY;  Service: Cardiovascular;  Laterality: N/A;   CARDIOVERSION N/A 04/12/2020   Procedure: CARDIOVERSION;  Surgeon: Raford Riggs, MD;  Location: [facility] ENDOSCOPY;  Service: Cardiovascular;  Laterality: N/A;   CHOLECYSTECTOMY N/A 12/07/2013   Procedure: LAPAROSCOPIC CHOLECYSTECTOMY ;  Surgeon: Donnice Bury, MD;  Location: [facility] OR;  Service: General;  Laterality: N/A;   COLONOSCOPY     CORONARY ARTERY BYPASS GRAFT  2004   [place]   CORONARY STENT INTERVENTION N/A 05/21/2017   Procedure: Coronary Stent Intervention;  Surgeon: Wonda Sharper, MD;  Location: [facility] INVASIVE CV LAB;  Service: Cardiovascular;  Laterality: N/A;   ENDARTERECTOMY Right 12/10/2015   Procedure: Right Carotid ENDARTERECTOMY;  Surgeon: Redell LITTIE Door, MD;  Location: [facility] OR;  Service: Vascular;  Laterality: Right;   ENDARTERECTOMY Left 12/30/2021   Procedure: LEFT CAROTID ENDARTERECTOMY;  Surgeon: Eliza Lonni RAMAN, MD;  Location: [facility] OR;  Service: Vascular;  Laterality: Left;   EXPLORATION POST OPERATIVE OPEN HEART     FINGER SURGERY     skin graft on rt index   FINGER SURGERY Right    right index finger.   HERNIA REPAIR Right    RIH   JOINT REPLACEMENT     KNEE SURGERY     replacement on both knees   LEFT HEART CATH AND CORS/GRAFTS ANGIOGRAPHY N/A 05/21/2017   Procedure: Left Heart Cath and Cors/Grafts Angiography;  Surgeon: Wonda Sharper, MD;  Location: [facility] INVASIVE CV LAB;  Service: Cardiovascular;  Laterality: N/A;   LEFT HEART CATH AND CORS/GRAFTS ANGIOGRAPHY N/A 05/20/2020   Procedure: LEFT HEART CATH AND CORS/GRAFTS ANGIOGRAPHY;  Surgeon: Wonda Sharper, MD;  Location: [facility] INVASIVE CV LAB;  Service: Cardiovascular;  Laterality: N/A;   PACEMAKER IMPLANT N/A 03/14/2020   Procedure: PACEMAKER IMPLANT;  Surgeon: Waddell Danelle ORN, MD;  Location: [facility] INVASIVE CV LAB;   Service: Cardiovascular;  Laterality: N/A;   PROSTATE SURGERY     PVC ABLATION N/A 10/27/2017   Procedure: PVC ABLATION;  Surgeon: Waddell Danelle ORN, MD;  Location: [facility] INVASIVE CV LAB;  Service: Cardiovascular;  Laterality: N/A;   TONSILLECTOMY     URINARY SURGERY     scar tissue   Family History  Problem Relation Age of Onset   Stroke Mother    Heart disease Mother    Heart disease Father    Hypertension Father    Hyperlipidemia Father    Cancer Sister        breast  and skin   Diabetes Sister    Hypertension Sister    Diabetes Brother    Heart disease Brother    Hyperlipidemia Brother    Hypertension Brother    Early death Neg Hx    Social History   Socioeconomic History   Marital status: Married    Spouse name: Rollene   Number of children: 2   Years of education: Not on file   Highest education level: Not on file  Occupational History   Occupation: retired  Tobacco Use   Smoking status: Former    Current packs/day: 0.00    Average packs/day: 1 pack/day for 13.0 years (13.0 ttl pk-yrs)    Types: Cigarettes    Start date: 11/30/1957    Quit date: 11/30/1970    Years since quitting: 53.8    Passive exposure: Never   Smokeless tobacco: Never  Vaping Use   Vaping status: Never Used  Substance and Sexual Activity   Alcohol use: No    Alcohol/week: 0.0 standard drinks of alcohol   Drug use: No   Sexual activity: Yes  Other Topics Concern   Not on file  Social History Narrative   Has 3 dogs.  Lives with wife.   Social Drivers of Corporate investment banker Strain: Low Risk  (09/07/2024)   Overall Financial Resource Strain (CARDIA)    Difficulty of Paying Living Expenses: Not hard at all  Food Insecurity: No Food Insecurity (09/07/2024)   Hunger Vital Sign    Worried About Running Out of Food in the Last Year: Never true    Ran Out of Food in the Last Year: Never true  Transportation Needs: No Transportation Needs (09/07/2024)   PRAPARE - Doctor, general practice (Medical): No    Lack  of Transportation (Non-Medical): No  Physical Activity: Inactive (09/07/2024)   Exercise Vital Sign    Days of Exercise per Week: 0 days    Minutes of Exercise per Session: 0 min  Stress: No Stress Concern Present (09/07/2024)   [employer] of Occupational Health - Occupational Stress Questionnaire    Feeling of Stress: Not at all  Social Connections: Socially Integrated (09/07/2024)   Social Connection and Isolation Panel    Frequency of Communication with Friends and Family: More than three times a week    Frequency of Social Gatherings with Friends and Family: More than three times a week    Attends Religious Services: More than 4 times per year    Active Member of [employer] or Organizations: Yes    Attends Banker Meetings: 1 to 4 times per year    Marital Status: Married    Tobacco Counseling Counseling given: Not Answered    Clinical Intake:  Pre-visit preparation completed: Yes  Pain : No/denies pain     BMI - recorded: 26.45 Nutritional Status: BMI 25 -29 Overweight Nutritional Risks: None Diabetes: Yes CBG done?: No Did pt. bring in CBG monitor from home?: No  Lab Results  Component Value Date   HGBA1C 7.1 (H) 11/09/2023   HGBA1C 7.1 (A) 09/03/2023   HGBA1C 7.3 (H) 04/21/2023     How often do you need to have someone help you when you read instructions, pamphlets, or other written materials from your doctor or pharmacy?: 1 - Never  Interpreter Needed?: No  Information entered by :: Verdie Saba, CMA   Activities of Daily Living     09/07/2024   10:21 AM  In your present state of health, do you have any difficulty performing the following activities:  Hearing? 0  Comment wears hearing aids  Vision? 0  Difficulty concentrating or making decisions? 0  Walking or climbing stairs? 0  Dressing or bathing? 0  Doing errands, shopping? 0  Preparing Food and eating ? N  Using the Toilet? N  In the  past six months, have you accidently leaked urine? N  Do you have problems with loss of bowel control? N  Managing your Medications? N  Managing your Finances? N  Housekeeping or managing your Housekeeping? N    Patient Care Team: Norleen Lynwood ORN, MD as PCP - General (Internal Medicine) Waddell Danelle ORN, MD as PCP - Electrophysiology (Cardiology) Wonda Sharper, MD as PCP - Cardiology (Cardiology) Wonda Sharper, MD as Consulting Physician (Cardiology) Ebbie Cough, MD as Consulting Physician (General Surgery) Claudene Arthea HERO, DO as Consulting Physician (Family Medicine) Loreda Hacker, DPM as Consulting Physician (Podiatry) Gershon Cough SAUNDERS, DPM as Consulting Physician (Podiatry)  I have updated your Care Teams any recent Medical Services you may have received from other providers in the past year.     Assessment:   This is a routine wellness examination for Thomas Marshall.  Hearing/Vision screen Hearing Screening - Comments:: Wears hearing aids Vision Screening - Comments:: Wears rx glasses - up to date with routine eye exams with the [facility]   Goals Addressed               This Visit's Progress     Patient Stated (pt-stated)        Patient stated he plans to continue to stay active       Depression Screen     09/07/2024   10:22 AM 03/03/2024    1:05 PM 11/09/2023    1:14  PM 09/03/2023    1:07 PM 08/30/2023   10:45 AM 04/21/2023    1:06 PM 11/26/2022    3:12 PM  PHQ 2/9 Scores  PHQ - 2 Score 0 0 0 0 0 0 0  PHQ- 9 Score 0   0 3  0    Fall Risk     09/07/2024   10:22 AM 03/03/2024    1:09 PM 11/09/2023    1:14 PM 09/03/2023    1:07 PM 08/30/2023   10:40 AM  Fall Risk   Falls in the past year? 0 0 0 0 0  Number falls in past yr: 0 0 0 0 0  Injury with Fall? 0 0 0 0 0  Risk for fall due to : No Fall Risks No Fall Risks No Fall Risks No Fall Risks No Fall Risks  Follow up Falls evaluation completed;Falls prevention discussed Falls evaluation completed Falls  evaluation completed Falls evaluation completed Falls prevention discussed;Falls evaluation completed    MEDICARE RISK AT HOME:  Medicare Risk at Home Any stairs in or around the home?: No If so, are there any without handrails?: No Home free of loose throw rugs in walkways, pet beds, electrical cords, etc?: Yes Adequate lighting in your home to reduce risk of falls?: Yes Life alert?: No Use of a cane, walker or w/c?: No Grab bars in the bathroom?: Yes Shower chair or bench in shower?: Yes Elevated toilet seat or a handicapped toilet?: Yes  TIMED UP AND GO:  Was the test performed?  No  Cognitive Function: 6CIT completed        09/07/2024   10:25 AM 08/30/2023   10:41 AM 08/12/2022    4:41 PM  6CIT Screen  What Year? 0 points 0 points 0 points  What month? 0 points 0 points 0 points  What time? 0 points 0 points 0 points  Count back from 20 0 points 0 points 0 points  Months in reverse 0 points 0 points 0 points  Repeat phrase 0 points 0 points 0 points  Total Score 0 points 0 points 0 points    Immunizations Immunization History  Administered Date(s) Administered   Fluad Quad(high Dose 65+) 07/26/2019, 08/30/2020, 08/09/2021, 08/21/2022   Fluad Trivalent(High Dose 65+) 08/31/2023   INFLUENZA, HIGH DOSE SEASONAL PF 08/17/2013, 09/08/2013, 07/31/2014, 08/31/2015, 07/31/2016, 07/22/2017, 08/30/2017, 08/04/2018, 08/22/2024   Influenza Split 10/13/2011   Influenza,inj,Quad PF,6+ Mos 08/20/2014, 08/13/2015, 07/31/2016   Influenza-Unspecified 09/29/2004, 09/23/2005, 11/30/2006, 07/31/2009, 08/22/2009, 08/23/2010, 08/31/2011, 10/13/2011, 10/28/2011, 08/08/2012, 08/31/2019, 07/31/2020, 11/30/2020, 07/31/2021, 10/31/2023   PFIZER(Purple Top)SARS-COV-2 Vaccination 12/20/2019, 01/08/2020, 11/26/2020   Pneumococcal Conjugate-13 09/10/2014   Pneumococcal Polysaccharide-23 10/30/2006, 05/22/2016   Pneumococcal-Unspecified 12/06/2003, 11/11/2006, 10/30/2008   Tdap 04/03/2014     Screening Tests Health Maintenance  Topic Date Due   Diabetic kidney evaluation - Urine ACR  Never done   Zoster Vaccines- Shingrix (1 of 2) Never done   DTaP/Tdap/Td (2 - Td or Tdap) 04/03/2024   OPHTHALMOLOGY EXAM  02/09/2025   FOOT EXAM  03/03/2025   Diabetic kidney evaluation - eGFR measurement  08/08/2025   Medicare Annual Wellness (AWV)  09/07/2025   Pneumococcal Vaccine: 50+ Years  Completed   Influenza Vaccine  Completed   Meningococcal B Vaccine  Aged Out   COVID-19 Vaccine  Discontinued    Health Maintenance Items Addressed:  I have recommended that this patient have a immunization for Tdap and Shingles but he declines at this time. I have discussed the risks and benefits of  this procedure with him. The patient verbalizes understanding.   Additional Screening:  Vision Screening: Recommended annual ophthalmology exams for early detection of glaucoma and other disorders of the eye. Is the patient up to date with their annual eye exam?  Yes  Who is the provider or what is the name of the office in which the patient attends annual eye exams? [facility] in [place], [place]  Dental Screening: Recommended annual dental exams for proper oral hygiene  Community Resource Referral / Chronic Care Management: CRR required this visit?  No   CCM required this visit?  No   Plan:    I have personally reviewed and noted the following in the patient's chart:   Medical and social history Use of alcohol, tobacco or illicit drugs  Current medications and supplements including opioid prescriptions. Patient is not currently taking opioid prescriptions. Functional ability and status Nutritional status Physical activity Advanced directives List of other physicians Hospitalizations, surgeries, and ER visits in previous 12 months Vitals Screenings to include cognitive, depression, and falls Referrals and appointments  In addition, I have reviewed and discussed with patient  certain preventive protocols, quality metrics, and best practice recommendations. A written personalized care plan for preventive services as well as general preventive health recommendations were provided to patient.   Verdie CHRISTELLA Saba, CMA   09/07/2024   After Visit Summary: (MyChart) Due to this being a telephonic visit, the after visit summary with patients personalized plan was offered to patient via MyChart   Notes: Nothing significant to report at this time.

## 2024-09-07 NOTE — Patient Instructions (Addendum)
 [name],  Thank you for taking the time for your Medicare Wellness Visit. I appreciate your continued commitment to your health goals. Please review the care plan we discussed, and feel free to reach out if I can assist you further.  Medicare recommends these wellness visits once per year to help you and your care team stay ahead of potential health issues. These visits are designed to focus on prevention, allowing your provider to concentrate on managing your acute and chronic conditions during your regular appointments.  Please note that Annual Wellness Visits do not include a physical exam. Some assessments may be limited, especially if the visit was conducted virtually. If needed, we may recommend a separate in-person follow-up with your provider.  Ongoing Care Seeing your primary care provider every 3 to 6 months helps us  monitor your health and provide consistent, personalized care.   Referrals If a referral was made during today's visit and you haven't received any updates within two weeks, please contact the referred provider directly to check on the status.  Recommended Screenings:  Health Maintenance  Topic Date Due   Yearly kidney health urinalysis for diabetes  Never done   Zoster (Shingles) Vaccine (1 of 2) Never done   DTaP/Tdap/Td vaccine (2 - Td or Tdap) 04/03/2024   Eye exam for diabetics  02/09/2025   Complete foot exam   03/03/2025   Yearly kidney function blood test for diabetes  08/08/2025   Medicare Annual Wellness Visit  09/07/2025   Pneumococcal Vaccine for age over 49  Completed   Flu Shot  Completed   Meningitis B Vaccine  Aged Out   COVID-19 Vaccine  Discontinued       09/07/2024   10:18 AM  Advanced Directives  Does Patient Have a Medical Advance Directive? Yes  Type of Estate agent of [place];Living will  Copy of Healthcare Power of Attorney in Chart? No - copy requested   Advance Care Planning is important because  it: Ensures you receive medical care that aligns with your values, goals, and preferences. Provides guidance to your family and loved ones, reducing the emotional burden of decision-making during critical moments.  Vision: Annual vision screenings are recommended for early detection of glaucoma, cataracts, and diabetic retinopathy. These exams can also reveal signs of chronic conditions such as diabetes and high blood pressure.  Dental: Annual dental screenings help detect early signs of oral cancer, gum disease, and other conditions linked to overall health, including heart disease and diabetes.

## 2024-09-11 ENCOUNTER — Ambulatory Visit: Admitting: Internal Medicine

## 2024-09-11 ENCOUNTER — Ambulatory Visit (INDEPENDENT_AMBULATORY_CARE_PROVIDER_SITE_OTHER): Payer: Medicare Other

## 2024-09-11 DIAGNOSIS — I495 Sick sinus syndrome: Secondary | ICD-10-CM

## 2024-09-11 LAB — CUP PACEART REMOTE DEVICE CHECK
Battery Remaining Longevity: 96 mo
Battery Remaining Percentage: 70 %
Battery Voltage: 3.02 V
Brady Statistic RV Percent Paced: 1 %
Date Time Interrogation Session: 20251013020014
Implantable Lead Connection Status: 753985
Implantable Lead Connection Status: 753985
Implantable Lead Implant Date: 20210415
Implantable Lead Implant Date: 20210415
Implantable Lead Location: 753859
Implantable Lead Location: 753860
Implantable Pulse Generator Implant Date: 20210415
Lead Channel Impedance Value: 410 Ohm
Lead Channel Pacing Threshold Amplitude: 0.75 V
Lead Channel Pacing Threshold Pulse Width: 0.5 ms
Lead Channel Sensing Intrinsic Amplitude: 6.1 mV
Lead Channel Setting Pacing Amplitude: 2.5 V
Lead Channel Setting Pacing Pulse Width: 0.5 ms
Lead Channel Setting Sensing Sensitivity: 2 mV
Pulse Gen Model: 2272
Pulse Gen Serial Number: 3813712

## 2024-09-12 NOTE — Progress Notes (Signed)
 Remote PPM Transmission

## 2024-09-14 ENCOUNTER — Ambulatory Visit: Payer: Self-pay | Admitting: Internal Medicine

## 2024-09-14 ENCOUNTER — Ambulatory Visit: Admitting: Internal Medicine

## 2024-09-14 ENCOUNTER — Encounter: Payer: Self-pay | Admitting: Internal Medicine

## 2024-09-14 VITALS — BP 122/82 | HR 62 | Temp 97.8°F | Ht 74.0 in | Wt 211.0 lb

## 2024-09-14 DIAGNOSIS — E1122 Type 2 diabetes mellitus with diabetic chronic kidney disease: Secondary | ICD-10-CM | POA: Diagnosis not present

## 2024-09-14 DIAGNOSIS — N1832 Chronic kidney disease, stage 3b: Secondary | ICD-10-CM | POA: Diagnosis not present

## 2024-09-14 DIAGNOSIS — E559 Vitamin D deficiency, unspecified: Secondary | ICD-10-CM | POA: Diagnosis not present

## 2024-09-14 DIAGNOSIS — E7800 Pure hypercholesterolemia, unspecified: Secondary | ICD-10-CM | POA: Diagnosis not present

## 2024-09-14 DIAGNOSIS — I10 Essential (primary) hypertension: Secondary | ICD-10-CM | POA: Diagnosis not present

## 2024-09-14 LAB — LIPID PANEL
Cholesterol: 118 mg/dL (ref 0–200)
HDL: 38.1 mg/dL — ABNORMAL LOW (ref >39.00)
LDL Cholesterol: 54 mg/dL (ref 0–99)
NonHDL: 79.4
Total CHOL/HDL Ratio: 3
Triglycerides: 127 mg/dL (ref 0.0–149.0)
VLDL: 25.4 mg/dL (ref 0.0–40.0)

## 2024-09-14 LAB — BASIC METABOLIC PANEL WITH GFR
BUN: 25 mg/dL — ABNORMAL HIGH (ref 6–23)
CO2: 30 meq/L (ref 19–32)
Calcium: 8.7 mg/dL (ref 8.4–10.5)
Chloride: 102 meq/L (ref 96–112)
Creatinine, Ser: 1.58 mg/dL — ABNORMAL HIGH (ref 0.40–1.50)
GFR: 39.73 mL/min — ABNORMAL LOW (ref >60.00)
Glucose, Bld: 214 mg/dL — ABNORMAL HIGH (ref 70–99)
Potassium: 4.4 meq/L (ref 3.5–5.1)
Sodium: 140 meq/L (ref 135–145)

## 2024-09-14 LAB — HEPATIC FUNCTION PANEL
ALT: 15 U/L (ref 0–53)
AST: 17 U/L (ref 0–37)
Albumin: 4 g/dL (ref 3.5–5.2)
Alkaline Phosphatase: 91 U/L (ref 39–117)
Bilirubin, Direct: 0.2 mg/dL (ref 0.0–0.3)
Total Bilirubin: 0.9 mg/dL (ref 0.2–1.2)
Total Protein: 6.3 g/dL (ref 6.0–8.3)

## 2024-09-14 LAB — HEMOGLOBIN A1C: Hgb A1c MFr Bld: 7.3 % — ABNORMAL HIGH (ref 4.6–6.5)

## 2024-09-14 NOTE — Progress Notes (Signed)
 The test results show that your current treatment is OK, as the tests are stable.  Please continue the same plan.  There is no other need for change of treatment or further evaluation based on these results, at this time.  thanks

## 2024-09-14 NOTE — Assessment & Plan Note (Signed)
 BP Readings from Last 3 Encounters:  09/14/24 122/82  04/21/24 (!) 148/84  03/03/24 122/64   Stable, pt to continue medical treatment coreg  3.125 mg bid, hydralazine  10 tid,

## 2024-09-14 NOTE — Patient Instructions (Signed)
 Please continue all other medications as before, and refills have been done if requested.  Please have the pharmacy call with any other refills you may need.  Please continue your efforts at being more active, low cholesterol diet, and weight control.  You are otherwise up to date with prevention measures today.  Please keep your appointments with your specialists as you may have planned - kidney doctor ever 4 months  Please go to the LAB at the blood drawing area for the tests to be done  You will be contacted by phone if any changes need to be made immediately.  Otherwise, you will receive a letter about your results with an explanation, but please check with MyChart first.  Please make an Appointment to return in 6 months, or sooner if needed

## 2024-09-14 NOTE — Progress Notes (Signed)
 Patient ID: LITTLETON HAUB, male   DOB: 1941/06/22, 83 y.o.   MRN: [id number]        Chief Complaint: follow up HTN, HLD, DM with ckd 3a, low vit d       HPI:  Thomas Marshall is a 83 y.o. male here overall doing ok, Pt denies chest pain, increased sob or doe, wheezing, orthopnea, PND, increased LE swelling, palpitations, dizziness or syncope.   Pt denies polydipsia, polyuria, or new focal neuro s/s.    Pt denies fever, wt loss, night sweats, loss of appetite, or other constitutional symptoms  Sees Nephrology every 4 mo and stable per pt.  Also sees [place] provider twice per yr to keep his benefits.       Wt Readings from Last 3 Encounters:  09/14/24 211 lb (95.7 kg)  09/07/24 206 lb (93.4 kg)  04/21/24 207 lb 9.6 oz (94.2 kg)   BP Readings from Last 3 Encounters:  09/14/24 122/82  04/21/24 (!) 148/84  03/03/24 122/64         Past Medical History:  Diagnosis Date   AAA (abdominal aortic aneurysm)    Arthritis    Cancer (HCC)    skin & squamous cell   Carotid artery disease    a. s/p R CEA 12/2015.   CKD (chronic kidney disease), stage III (HCC)    a. per historical labs.   Coronary artery disease    a. s/p CABG 2004.   Diabetes mellitus without complication (HCC)    GERD (gastroesophageal reflux disease)    Hyperlipidemia    Hypertension    Medication intolerance Multiple    PAF (paroxysmal atrial fibrillation) (HCC)    Pneumonia    Presence of permanent cardiac pacemaker    Sinus bradycardia    a. baseline HR 50s-60s, also h/o bradycardia on metoprolol  and carvedilol    Past Surgical History:  Procedure Laterality Date   ANKLE SURGERY     right fused   BACK SURGERY     low back    CARDIOVERSION N/A 02/14/2020   Procedure: CARDIOVERSION;  Surgeon: Barbaraann Darryle Ned, MD;  Location: [facility] ENDOSCOPY;  Service: Cardiovascular;  Laterality: N/A;   CARDIOVERSION N/A 02/26/2020   Procedure: CARDIOVERSION;  Surgeon: Mona Vinie BROCKS, MD;  Location: [facility] ENDOSCOPY;  Service: Cardiovascular;   Laterality: N/A;   CARDIOVERSION N/A 04/12/2020   Procedure: CARDIOVERSION;  Surgeon: Raford Riggs, MD;  Location: [facility] ENDOSCOPY;  Service: Cardiovascular;  Laterality: N/A;   CHOLECYSTECTOMY N/A 12/07/2013   Procedure: LAPAROSCOPIC CHOLECYSTECTOMY ;  Surgeon: Donnice Bury, MD;  Location: [facility] OR;  Service: General;  Laterality: N/A;   COLONOSCOPY     CORONARY ARTERY BYPASS GRAFT  2004   [place]   CORONARY STENT INTERVENTION N/A 05/21/2017   Procedure: Coronary Stent Intervention;  Surgeon: Wonda Sharper, MD;  Location: [facility] INVASIVE CV LAB;  Service: Cardiovascular;  Laterality: N/A;   ENDARTERECTOMY Right 12/10/2015   Procedure: Right Carotid ENDARTERECTOMY;  Surgeon: Redell LITTIE Door, MD;  Location: [facility] OR;  Service: Vascular;  Laterality: Right;   ENDARTERECTOMY Left 12/30/2021   Procedure: LEFT CAROTID ENDARTERECTOMY;  Surgeon: Eliza Lonni RAMAN, MD;  Location: [facility] OR;  Service: Vascular;  Laterality: Left;   EXPLORATION POST OPERATIVE OPEN HEART     FINGER SURGERY     skin graft on rt index   FINGER SURGERY Right    right index finger.   HERNIA REPAIR Right    RIH   JOINT REPLACEMENT  KNEE SURGERY     replacement on both knees   LEFT HEART CATH AND CORS/GRAFTS ANGIOGRAPHY N/A 05/21/2017   Procedure: Left Heart Cath and Cors/Grafts Angiography;  Surgeon: Wonda Sharper, MD;  Location: [facility] INVASIVE CV LAB;  Service: Cardiovascular;  Laterality: N/A;   LEFT HEART CATH AND CORS/GRAFTS ANGIOGRAPHY N/A 05/20/2020   Procedure: LEFT HEART CATH AND CORS/GRAFTS ANGIOGRAPHY;  Surgeon: Wonda Sharper, MD;  Location: [facility] INVASIVE CV LAB;  Service: Cardiovascular;  Laterality: N/A;   PACEMAKER IMPLANT N/A 03/14/2020   Procedure: PACEMAKER IMPLANT;  Surgeon: Waddell Danelle ORN, MD;  Location: [facility] INVASIVE CV LAB;  Service: Cardiovascular;  Laterality: N/A;   PROSTATE SURGERY     PVC ABLATION N/A 10/27/2017   Procedure: PVC ABLATION;  Surgeon: Waddell Danelle ORN, MD;  Location: [facility] INVASIVE CV LAB;   Service: Cardiovascular;  Laterality: N/A;   TONSILLECTOMY     URINARY SURGERY     scar tissue    reports that he quit smoking about 53 years ago. His smoking use included cigarettes. He started smoking about 66 years ago. He has a 13 pack-year smoking history. He has never been exposed to tobacco smoke. He has never used smokeless tobacco. He reports that he does not drink alcohol and does not use drugs. family history includes Cancer in his sister; Diabetes in his brother and sister; Heart disease in his brother, father, and mother; Hyperlipidemia in his brother and father; Hypertension in his brother, father, and sister; Stroke in his mother. Allergies  Allergen Reactions   Lisinopril Swelling and Other (See Comments)    Angioedema.    Losartan Swelling and Other (See Comments)    Lips swell Angioedema    Nifedipine Swelling and Other (See Comments)    Sugar increase   Triamterene Swelling and Other (See Comments)    Face swells, no breathing impairment   Hydralazine  Hcl Other (See Comments)    Fatigue; poor appetite   Losartan Potassium-Hctz Swelling and Other (See Comments)   Amoxicillin Rash and Other (See Comments)    Did it involve swelling of the face/tongue/throat, SOB, or low BP? No Did it involve sudden or severe rash/hives, skin peeling, or any reaction on the inside of your mouth or nose? No Did you need to seek medical attention at a hospital or doctor's office? No When did it last happen?      10 + years If all above answers are "NO", may proceed with cephalosporin use.     Ampicillin Rash and Other (See Comments)    Did it involve swelling of the face/tongue/throat, SOB, or low BP? No Did it involve sudden or severe rash/hives, skin peeling, or any reaction on the inside of your mouth or nose? No Did you need to seek medical attention at a hospital or doctor's office? No When did it last happen?      10 + years If all above answers are NO, may proceed with  cephalosporin use.    Carvedilol  Other (See Comments)    Low heart rate (in higher doses)   Current Outpatient Medications on File Prior to Visit  Medication Sig Dispense Refill   Accu-Chek Softclix Lancets lancets USE 1 CLEAN LANCET TO CHECK GLUCOSE ONCE DAILY AS DIRECTED 100 each 0   apixaban  (ELIQUIS ) 2.5 MG TABS tablet Take 1 tablet (2.5 mg total) by mouth 2 (two) times daily. (0800 & 2000) 180 tablet 3   aspirin  EC 81 MG tablet Take 1 tablet (81 mg total) by mouth  daily. 90 tablet 3   atorvastatin  (LIPITOR) 40 MG tablet Take 40 mg by mouth at bedtime.      Blood Glucose Calibration (ACCU-CHEK GUIDE CONTROL) LIQD Use as directed once daily as needed E11.9 1 each 1   Blood Glucose Monitoring Suppl (ACCU-CHEK GUIDE ME) w/Device KIT Use as directed four times per day E11.9 1 kit 0   carvedilol  (COREG ) 3.125 MG tablet Take 1 tablet (3.125 mg total) by mouth daily at 12 noon. 30 tablet 6   cholecalciferol  (VITAMIN D3) 25 MCG (1000 UNIT) tablet Take 1,000 Units by mouth every evening.     glucose blood (ACCU-CHEK GUIDE TEST) test strip use as directed once daily E11.9 100 each 12   hydrALAZINE  (APRESOLINE ) 10 MG tablet Take 10 mg by mouth 3 (three) times daily as needed (elevated bp greater than 160).     magnesium  oxide (MAG-OX) 400 MG tablet Take 800 mg by mouth 2 (two) times daily.      meclizine  (ANTIVERT ) 25 MG tablet Take 1 tablet (25 mg total) by mouth 3 (three) times daily as needed for dizziness. 30 tablet 0   Multiple Vitamins-Minerals (MENS 50+ MULTI VITAMIN/MIN PO) Take by mouth daily at 6 (six) AM.     mupirocin  ointment (BACTROBAN ) 2 % Apply 1 Application topically 2 (two) times daily. use pea sized amount on end of pinkie finger and put just on inside of nostril and pinch 22 g 0   nitroGLYCERIN  (NITROSTAT ) 0.4 MG SL tablet Place 1 tablet (0.4 mg total) under the tongue every 5 (five) minutes as needed for chest pain (x 3 doses). 30 tablet 3   OMEPRAZOLE PO Take 1 capsule by mouth  daily.     terazosin  (HYTRIN ) 1 MG capsule Take 1 mg by mouth at bedtime.  30 capsule 11   vitamin C  (ASCORBIC ACID ) 500 MG tablet Take 500 mg by mouth daily with breakfast.      Zinc 22.5 MG TABS Take 22.5 mg by mouth 2 (two) times a week.     pantoprazole  (PROTONIX ) 40 MG tablet Take 1 tablet (40 mg total) by mouth daily. (Patient not taking: Reported on 09/07/2024) 90 tablet 3   No current facility-administered medications on file prior to visit.        ROS:  All others reviewed and negative.  Objective        PE:  BP 122/82 (BP Location: Right Arm, Patient Position: Sitting, Cuff Size: Normal)   Pulse 62   Temp 97.8 F (36.6 C) (Oral)   Ht 6' 2 (1.88 m)   Wt 211 lb (95.7 kg)   SpO2 100%   BMI 27.09 kg/m                 Constitutional: Pt appears in NAD               HENT: Head: NCAT.                Right Ear: External ear normal.                 Left Ear: External ear normal.                Eyes: . Pupils are equal, round, and reactive to light. Conjunctivae and EOM are normal               Nose: without d/c or deformity               Neck:  Neck supple. Gross normal ROM               Cardiovascular: Normal rate and regular rhythm.                 Pulmonary/Chest: Effort normal and breath sounds without rales or wheezing.                Abd:  Soft, NT, ND, + BS, no organomegaly               Neurological: Pt is alert. At baseline orientation, motor grossly intact               Skin: Skin is warm. No rashes, no other new lesions, LE edema - none               Psychiatric: Pt behavior is normal without agitation   Micro: none  Cardiac tracings I have personally interpreted today:  none  Pertinent Radiological findings (summarize): none   Lab Results  Component Value Date   WBC 5.6 11/09/2023   HGB 13.2 11/09/2023   HCT 41.5 11/09/2023   PLT 125.0 (L) 11/09/2023   GLUCOSE 84 11/09/2023   CHOL 115 04/21/2023   TRIG 82.0 04/21/2023   HDL 40.10 04/21/2023   LDLDIRECT  67.0 09/16/2016   LDLCALC 58 04/21/2023   ALT 15 04/21/2023   AST 19 04/21/2023   NA 145 11/09/2023   K 5.3 No hemolysis seen (H) 11/09/2023   CL 106 11/09/2023   CREATININE 1.54 (H) 11/09/2023   BUN 35 (H) 11/09/2023   CO2 33 (H) 11/09/2023   TSH 1.72 04/21/2023   PSA 1.09 03/31/2018   INR 1.2 12/26/2021   HGBA1C 7.1 (H) 11/09/2023   MICROALBUR 2.9 01/21/2016   Assessment/Plan:  MACARI ZALESKY is a 83 y.o. White or Caucasian [1] male with  has a past medical history of AAA (abdominal aortic aneurysm), Arthritis, Cancer (HCC), Carotid artery disease, CKD (chronic kidney disease), stage III (HCC), Coronary artery disease, Diabetes mellitus without complication (HCC), GERD (gastroesophageal reflux disease), Hyperlipidemia, Hypertension, Medication intolerance (Multiple ), PAF (paroxysmal atrial fibrillation) (HCC), Pneumonia, Presence of permanent cardiac pacemaker, and Sinus bradycardia.  Diabetes mellitus with chronic kidney disease (HCC) Ckd3a Lab Results  Component Value Date   CREATININE 1.54 (H) 11/09/2023   Stable overall, cont to avoid nephrotoxins Lab Results  Component Value Date   HGBA1C 7.1 (H) 11/09/2023   Controlled at last check as is < 7.5 and good for age,  pt to continue diet, wt control   Vitamin D  deficiency Last vitamin D  Lab Results  Component Value Date   VD25OH 66.63 04/21/2023   Stable, cont oral replacement   Hyperlipidemia Lab Results  Component Value Date   LDLCALC 58 04/21/2023   Stable, pt to continue current statin lipitor 40 mg qd   Essential hypertension BP Readings from Last 3 Encounters:  09/14/24 122/82  04/21/24 (!) 148/84  03/03/24 122/64   Stable, pt to continue medical treatment coreg  3.125 mg bid, hydralazine  10 tid,   Followup: Return in about 6 months (around 03/15/2025).  Lynwood Rush, MD 09/14/2024 12:27 PM [facility] Internal Medicine

## 2024-09-14 NOTE — Assessment & Plan Note (Addendum)
 Ckd3a Lab Results  Component Value Date   CREATININE 1.54 (H) 11/09/2023   Stable overall, cont to avoid nephrotoxins Lab Results  Component Value Date   HGBA1C 7.1 (H) 11/09/2023   Controlled at last check as is < 7.5 and good for age,  pt to continue diet, wt control

## 2024-09-14 NOTE — Assessment & Plan Note (Signed)
Last vitamin D Lab Results  Component Value Date   VD25OH 66.63 04/21/2023   Stable, cont oral replacement

## 2024-09-14 NOTE — Assessment & Plan Note (Signed)
Lab Results  Component Value Date   LDLCALC 58 04/21/2023   Stable, pt to continue current statin lipitor 40 mg qd

## 2024-09-26 ENCOUNTER — Other Ambulatory Visit: Payer: Self-pay

## 2024-09-26 DIAGNOSIS — I480 Paroxysmal atrial fibrillation: Secondary | ICD-10-CM

## 2024-11-14 ENCOUNTER — Encounter: Payer: Self-pay | Admitting: Podiatry

## 2024-11-14 ENCOUNTER — Ambulatory Visit: Admitting: Podiatry

## 2024-11-14 DIAGNOSIS — M79675 Pain in left toe(s): Secondary | ICD-10-CM | POA: Diagnosis not present

## 2024-11-14 DIAGNOSIS — E1140 Type 2 diabetes mellitus with diabetic neuropathy, unspecified: Secondary | ICD-10-CM

## 2024-11-14 DIAGNOSIS — L84 Corns and callosities: Secondary | ICD-10-CM

## 2024-11-14 DIAGNOSIS — M79674 Pain in right toe(s): Secondary | ICD-10-CM | POA: Diagnosis not present

## 2024-11-14 DIAGNOSIS — B351 Tinea unguium: Secondary | ICD-10-CM | POA: Diagnosis not present

## 2024-11-14 NOTE — Progress Notes (Signed)
 Patient ID: Thomas Marshall, male   DOB: 07/19/1941, 83 y.o.   MRN: [id number]  Subjective: Chief Complaint  Patient presents with   [facility]    Rm13 Diabetic foot care     83 y.o. returns the office today for painful, elongated, thickened toenails which he cannot trim himself and for calluses on the right foot.  The calluses on the right foot are getting thick and cause pain. No open lesions.   He is on Eliquis   Last A1c was 7.3 on 09/14/2024  Norleen Lynwood ORN, MD Last seen 09/14/2024   Objective: AAO 3, NAD DP/PT pulses palpable, CRT less than 3 seconds Protective sensation decreased with Thomas Marshall monofilament Nails hypertrophic, dystrophic, elongated, brittle, discolored 10. There is tenderness overlying the nails 1-5 bilaterally. There is no surrounding erythema or drainage along the nail sites. There is hyperkeratotic lesion right fifth metatarsal base and fifth metatarsal head. No ulceration, drainage or any signs of infection.  No pain with calf compression, swelling, warmth, erythema.  Assessment: Symptomatic onychomycosis; hyperkeratotic tissue  Plan: -Treatment options including alternatives, risks, complications were discussed -Nails sharply debrided 10 without complication/bleeding. -Sharply debrided the hyperkeratotic lesions x 2 right foot any complications or bleeding. -Discussed daily foot inspection. -Follow-up in 3 months or sooner if any problems are to arise. In the meantime, encouraged to call the office with any questions, concerns, changes symptoms.  Return in about 3 months (around 02/12/2025).   Donnice Fees, DPM

## 2024-12-04 ENCOUNTER — Ambulatory Visit (HOSPITAL_COMMUNITY)

## 2024-12-13 LAB — LAB REPORT - SCANNED
Albumin, Urine POC: 590.6
Albumin/Creatinine Ratio, Urine, POC: 142
Creatinine, POC: 417 mg/dL
EGFR: 38

## 2025-02-12 ENCOUNTER — Ambulatory Visit: Admitting: Podiatry

## 2025-03-16 ENCOUNTER — Ambulatory Visit: Admitting: Internal Medicine

## 2025-09-12 ENCOUNTER — Ambulatory Visit

## 2025-09-12 ENCOUNTER — Encounter: Admitting: Internal Medicine
